# Patient Record
Sex: Male | Born: 1985 | Race: Black or African American | Hispanic: No | Marital: Single | Smoking: Light tobacco smoker
Health system: Southern US, Community
[De-identification: ages and names within clinical notes are randomized; demographics above are authoritative.]

## PROBLEM LIST (undated history)

## (undated) DIAGNOSIS — F319 Bipolar disorder, unspecified: Secondary | ICD-10-CM

## (undated) DIAGNOSIS — F259 Schizoaffective disorder, unspecified: Secondary | ICD-10-CM

---

## 2015-11-08 ENCOUNTER — Ambulatory Visit: Payer: Self-pay

## 2018-10-16 ENCOUNTER — Other Ambulatory Visit: Payer: Self-pay

## 2018-10-16 ENCOUNTER — Emergency Department
Admission: EM | Admit: 2018-10-16 | Discharge: 2018-10-17 | Disposition: A | Discharge status: Other Acute Inpatient Hospital | Payer: Medicaid Other | Attending: Emergency Medicine | Admitting: Emergency Medicine

## 2018-10-16 ENCOUNTER — Encounter: Payer: Self-pay | Admitting: Emergency Medicine

## 2018-10-16 DIAGNOSIS — F319 Bipolar disorder, unspecified: Secondary | ICD-10-CM | POA: Diagnosis not present

## 2018-10-16 DIAGNOSIS — Z046 Encounter for general psychiatric examination, requested by authority: Secondary | ICD-10-CM | POA: Diagnosis present

## 2018-10-16 DIAGNOSIS — F259 Schizoaffective disorder, unspecified: Secondary | ICD-10-CM | POA: Diagnosis not present

## 2018-10-16 DIAGNOSIS — R4689 Other symptoms and signs involving appearance and behavior: Secondary | ICD-10-CM | POA: Insufficient documentation

## 2018-10-16 DIAGNOSIS — R443 Hallucinations, unspecified: Secondary | ICD-10-CM | POA: Insufficient documentation

## 2018-10-16 DIAGNOSIS — F29 Unspecified psychosis not due to a substance or known physiological condition: Secondary | ICD-10-CM | POA: Insufficient documentation

## 2018-10-16 HISTORY — DX: Schizoaffective disorder, unspecified: F25.9

## 2018-10-16 HISTORY — DX: Bipolar disorder, unspecified: F31.9

## 2018-10-16 LAB — COMPREHENSIVE METABOLIC PANEL
ALT: 19 U/L (ref 0–44)
AST: 60 U/L — AB (ref 15–41)
Albumin: 4.5 g/dL (ref 3.5–5.0)
Alkaline Phosphatase: 46 U/L (ref 38–126)
Anion gap: 12 (ref 5–15)
BUN: 32 mg/dL — AB (ref 6–20)
CALCIUM: 9.3 mg/dL (ref 8.9–10.3)
CO2: 21 mmol/L — ABNORMAL LOW (ref 22–32)
CREATININE: 1.56 mg/dL — AB (ref 0.61–1.24)
Chloride: 105 mmol/L (ref 98–111)
GFR calc Af Amer: >60 mL/min (ref >60)
GFR calc non Af Amer: 57 mL/min — ABNORMAL LOW (ref >60)
Glucose, Bld: 74 mg/dL (ref 70–99)
Potassium: 3.3 mmol/L — ABNORMAL LOW (ref 3.5–5.1)
Sodium: 138 mmol/L (ref 135–145)
Total Bilirubin: 1.9 mg/dL — ABNORMAL HIGH (ref 0.3–1.2)
Total Protein: 7.2 g/dL (ref 6.5–8.1)

## 2018-10-16 LAB — CBC
HCT: 40.5 % (ref 39.0–52.0)
Hemoglobin: 13.7 g/dL (ref 13.0–17.0)
MCH: 32 pg (ref 26.0–34.0)
MCHC: 33.8 g/dL (ref 30.0–36.0)
MCV: 94.6 fL (ref 80.0–100.0)
Platelets: 209 10*3/uL (ref 150–400)
RBC: 4.28 MIL/uL (ref 4.22–5.81)
RDW: 12.4 % (ref 11.5–15.5)
WBC: 5.4 10*3/uL (ref 4.0–10.5)
nRBC: 0 % (ref 0.0–0.2)

## 2018-10-16 LAB — SALICYLATE LEVEL: Salicylate Lvl: <7 mg/dL (ref 2.8–30.0)

## 2018-10-16 LAB — ACETAMINOPHEN LEVEL: Acetaminophen (Tylenol), Serum: <10 ug/mL — ABNORMAL LOW (ref 10–30)

## 2018-10-16 LAB — ETHANOL: Alcohol, Ethyl (B): <10 mg/dL (ref <10)

## 2018-10-16 MED ORDER — LORAZEPAM 2 MG/ML IJ SOLN
2.0000 mg | Freq: Once | INTRAMUSCULAR | Status: AC
  Administered 2018-10-16: 2 mg via INTRAMUSCULAR
  Filled 2018-10-16: qty 1

## 2018-10-16 MED ORDER — HALOPERIDOL LACTATE 5 MG/ML IJ SOLN
5.0000 mg | Freq: Once | INTRAMUSCULAR | Status: AC
  Administered 2018-10-16: 5 mg via INTRAMUSCULAR
  Filled 2018-10-16: qty 1

## 2018-10-16 NOTE — BH Assessment (Signed)
Assessment Note  Jackson [name] is an 33 y.o. male who presents today complaining of anger being handcuffed.  Patient was apparently handcuffed because he was hallucinating having hyper religious ideas and aggressive behavior.  Patient was very agitated, initially we could not get much of a history we did give him Haldol and Ativan, to keep him safe.  Patient has a history of schizophrenia.  There is reports of hallucinations although he denies it. Pt to room 22 in handcuffs with officers. IM medications administered as ordered. Pt has hx/o Bipolar and schizophrenia. Pt has not been taking his medications for the past 3 weeks. Pt has been threatening staff from group home and told them that Jesus was talking to him. Pt is uncooperative in triage. Refusing to dress out.  Diagnosis: Schizoaffective disorder  Past Medical History:  Past Medical History:  Diagnosis Date  . Bipolar 1 disorder (HCC)   . Schizoaffective disorder (HCC)     History reviewed. No pertinent surgical history.  Family History: No family history on file.  Social History:  has no history on file for tobacco, alcohol, and drug.  Additional Social History:     CIWA:   COWS:    Allergies: No Known Allergies  Home Medications: (Not in a hospital admission)   OB/GYN Status:  No LMP for male patient.  General Assessment Data Assessment unable to be completed: Yes Reason for not completing assessment: . IM medications administered as ordered. Pt has hx/o Bipolar and schizophrenia.  Admission Status: Involuntary                    Mental Status Report Motor Activity: Freedom of movement                            Advance Directives (For Healthcare) Does Patient Have a Medical Advance Directive?: (UTA)          Disposition:     On Site Evaluation by:   Reviewed with Physician:    Jackson [name] 10/16/2018 7:55 PM

## 2018-10-16 NOTE — ED Notes (Signed)
Pt to room 22 in handcuffs with officers.  IM medications administered as ordered. Pt to be dressed out after mediations have taken effect. Maintained on 15 minute checks and observation by security camera for safety.

## 2018-10-16 NOTE — ED Notes (Signed)
Pt. Brought in by [employer] in forensic restraints.  Pt. Was given medication, pt. Resting on bed.  Forensic restraints removed, no observed injury.  Pt. Woke up while this nurse was changing him.  Patients pants and underwear changed into scrubs.  Blood obtained.  Pt. Still has 2 t-shirts on.  Pt. Sleeping at this time.

## 2018-10-16 NOTE — ED Triage Notes (Signed)
Pt to ED via [employer] under IVC. Pt has hx/o Bipolar and schizophrenia. Pt has not been taking his medications for the past 3 weeks. Pt has been threatening staff from group home and told them that Jesus was talking to him. Pt is uncooperative in triage. Refusing to dress out at this time.

## 2018-10-16 NOTE — ED Provider Notes (Addendum)
[facility] Emergency Department Provider Note  ____________________________________________   I have reviewed the triage vital signs and the nursing notes. Where available I have reviewed prior notes and, if possible and indicated, outside hospital notes.    HISTORY  Chief Complaint Hallucinations    HPI Jackson [name] is a 33 y.o. male who presents today complaining of anger being handcuffed.  Patient was apparently handcuffed because he was hallucinating having hyper religious ideas and aggressive behavior.  Patient was very agitated, initially we could not get much of a history we did give him Haldol and Ativan, to keep him safe.  Patient has a history of schizophrenia.  There is reports of hallucinations although he denies it to me at this time he is not really talking to me very much     Past Medical History:  Diagnosis Date  . Bipolar 1 disorder (HCC)   . Schizoaffective disorder (HCC)     There are no active problems to display for this patient.   History reviewed. No pertinent surgical history.  Prior to Admission medications   Not on File    Allergies Patient has no known allergies.  No family history on file.  Social History Social History   Tobacco Use  . Smoking status: Not on file  Substance Use Topics  . Alcohol use: Not on file  . Drug use: Not on file    Review of Systems patient answers no to all my questions. Constitutional: No fever/chills Eyes: No visual changes. ENT: No sore throat. No stiff neck no neck pain Cardiovascular: Denies chest pain. Respiratory: Denies shortness of breath. Gastrointestinal:   no vomiting.  No diarrhea.  No constipation. Genitourinary: Negative for dysuria. Musculoskeletal: Negative lower extremity swelling Skin: Negative for rash. Neurological: Negative for severe headaches, focal weakness or numbness.   ____________________________________________   PHYSICAL EXAM:  VITAL  SIGNS: ED Triage Vitals  Enc Vitals Group     BP      Pulse      Resp      Temp      Temp src      SpO2      Weight      Height      Head Circumference      Peak Flow      Pain Score      Pain Loc      Pain Edu?      Excl. in GC?     Constitutional: Seems upset but will converse with me a little bit after medications.  In no acute distress. Eyes: Conjunctivae are normal Head: Atraumatic HEENT: No congestion/rhinnorhea. Mucous membranes are moist.  Oropharynx non-erythematous Neck:   Nontender with no meningismus, no masses, no stridor Cardiovascular: Normal rate, regular rhythm. Grossly normal heart sounds.  Good peripheral circulation. Respiratory: Normal respiratory effort.  No retractions. Lungs CTAB. Abdominal: Soft and nontender. No distention. No guarding no rebound Back:  There is no focal tenderness or step off.  there is no midline tenderness there are no lesions noted. there is no CVA tenderness Musculoskeletal: No lower extremity tenderness, no upper extremity tenderness. No joint effusions, no DVT signs strong distal pulses no edema Neurologic:  Normal speech and language. No gross focal neurologic deficits are appreciated.  Skin:  Skin is warm, dry and intact. No rash noted. Psychiatric: Mood and affect agitated and upset  ____________________________________________   LABS (all labs ordered are listed, but only abnormal results are displayed)  Labs Reviewed  COMPREHENSIVE METABOLIC PANEL  ETHANOL  SALICYLATE LEVEL  ACETAMINOPHEN LEVEL  CBC  URINE DRUG SCREEN, QUALITATIVE ([facility] ONLY)    Pertinent labs  results that were available during my care of the patient were reviewed by me and considered in my medical decision making (see chart for details). ____________________________________________  EKG  I personally interpreted any EKGs ordered by me or triage  ____________________________________________  RADIOLOGY  Pertinent labs & imaging results  that were available during my care of the patient were reviewed by me and considered in my medical decision making (see chart for details). If possible, patient and/or family made aware of any abnormal findings.  No results found. ____________________________________________    PROCEDURES  Procedure(s) performed: None  Procedures  Critical Care performed: None  ____________________________________________   INITIAL IMPRESSION / ASSESSMENT AND PLAN / ED COURSE  Pertinent labs & imaging results that were available during my care of the patient were reviewed by me and considered in my medical decision making (see chart for details).  Patient with a history of schizophrenia presents today after noncompliance with medications with agitation and reported hallucinations.  He is under IVC.  We have given him medication to calm him down so he does not hurt himself or others, and we will institute a medical screening exam prior to psychiatric disposition.  ----------------------------------------- 8:09 PM on 10/16/2018 -----------------------------------------  Sleeping comfortably.  ----------------------------------------- 11:29 PM on 10/16/2018 -----------------------------------------  Singed out to dr. Fanny Bien at the end of my shift.    ____________________________________________   FINAL CLINICAL IMPRESSION(S) / ED DIAGNOSES  Final diagnoses:  None      This chart was dictated using voice recognition software.  Despite best efforts to proofread,  errors can occur which can change meaning.   3    Jeanmarie Plant, MD 10/16/18 Serena Croissant    Jeanmarie Plant, MD 10/16/18 2009    Jeanmarie Plant, MD 10/16/18 [id number]

## 2018-10-17 ENCOUNTER — Encounter: Payer: Self-pay | Admitting: Family Medicine

## 2018-10-17 ENCOUNTER — Inpatient Hospital Stay
Admission: AD | Admit: 2018-10-17 | Discharge: 2019-01-07 | DRG: 885 | Disposition: A | Discharge status: Home / Self Care | Payer: Medicaid Other | Attending: Psychiatry | Admitting: Psychiatry

## 2018-10-17 DIAGNOSIS — F17210 Nicotine dependence, cigarettes, uncomplicated: Secondary | ICD-10-CM | POA: Diagnosis present

## 2018-10-17 DIAGNOSIS — Z91199 Patient's noncompliance with other medical treatment and regimen due to unspecified reason: Secondary | ICD-10-CM

## 2018-10-17 DIAGNOSIS — F419 Anxiety disorder, unspecified: Secondary | ICD-10-CM | POA: Diagnosis present

## 2018-10-17 DIAGNOSIS — F250 Schizoaffective disorder, bipolar type: Secondary | ICD-10-CM | POA: Diagnosis not present

## 2018-10-17 DIAGNOSIS — Z23 Encounter for immunization: Secondary | ICD-10-CM | POA: Diagnosis not present

## 2018-10-17 DIAGNOSIS — Z9119 Patient's noncompliance with other medical treatment and regimen: Secondary | ICD-10-CM

## 2018-10-17 DIAGNOSIS — G4700 Insomnia, unspecified: Secondary | ICD-10-CM | POA: Diagnosis present

## 2018-10-17 DIAGNOSIS — Z1159 Encounter for screening for other viral diseases: Secondary | ICD-10-CM | POA: Diagnosis not present

## 2018-10-17 DIAGNOSIS — Z716 Tobacco abuse counseling: Secondary | ICD-10-CM | POA: Diagnosis not present

## 2018-10-17 DIAGNOSIS — F259 Schizoaffective disorder, unspecified: Secondary | ICD-10-CM | POA: Diagnosis present

## 2018-10-17 MED ORDER — PALIPERIDONE ER 3 MG PO TB24: 6.0000 mg | ORAL_TABLET | Freq: Every day | ORAL | Status: DC

## 2018-10-17 MED ORDER — LORAZEPAM 2 MG/ML IJ SOLN
2.0000 mg | INTRAMUSCULAR | Status: DC | PRN
  Administered 2018-10-20: 2 mg via INTRAMUSCULAR
  Filled 2018-10-17: qty 1

## 2018-10-17 MED ORDER — LORAZEPAM 2 MG/ML IJ SOLN: 2.0000 mg | INTRAMUSCULAR | Status: DC | PRN

## 2018-10-17 MED ORDER — HYDROXYZINE HCL 50 MG PO TABS
50.0000 mg | ORAL_TABLET | ORAL | Status: DC | PRN
  Administered 2018-10-26 – 2018-10-28 (×3): 50 mg via ORAL
  Filled 2018-10-17 (×4): qty 1

## 2018-10-17 MED ORDER — ALUM & MAG HYDROXIDE-SIMETH 200-200-20 MG/5ML PO SUSP
30.0000 mL | ORAL | Status: DC | PRN
  Administered 2018-10-24 – 2018-10-26 (×3): 30 mL via ORAL
  Filled 2018-10-17 (×3): qty 30

## 2018-10-17 MED ORDER — DIPHENHYDRAMINE HCL 50 MG/ML IJ SOLN: 50.0000 mg | INTRAMUSCULAR | Status: DC | PRN

## 2018-10-17 MED ORDER — DIPHENHYDRAMINE HCL 50 MG/ML IJ SOLN
50.0000 mg | INTRAMUSCULAR | Status: DC | PRN
  Administered 2018-10-20: 50 mg via INTRAMUSCULAR
  Filled 2018-10-17: qty 1

## 2018-10-17 MED ORDER — HALOPERIDOL LACTATE 5 MG/ML IJ SOLN: 5.0000 mg | INTRAMUSCULAR | Status: DC | PRN

## 2018-10-17 MED ORDER — DIVALPROEX SODIUM ER 500 MG PO TB24
1500.0000 mg | ORAL_TABLET | Freq: Every day | ORAL | Status: DC
  Filled 2018-10-17: qty 3

## 2018-10-17 MED ORDER — TRAZODONE HCL 100 MG PO TABS
100.0000 mg | ORAL_TABLET | Freq: Every evening | ORAL | Status: DC | PRN
  Administered 2018-10-26 – 2019-01-02 (×4): 100 mg via ORAL
  Filled 2018-10-17 (×4): qty 1

## 2018-10-17 MED ORDER — DIVALPROEX SODIUM ER 500 MG PO TB24: 1500.0000 mg | ORAL_TABLET | Freq: Every day | ORAL | Status: DC

## 2018-10-17 MED ORDER — ACETAMINOPHEN 325 MG PO TABS
650.0000 mg | ORAL_TABLET | Freq: Four times a day (QID) | ORAL | Status: DC | PRN
  Administered 2018-10-26: 650 mg via ORAL
  Filled 2018-10-17: qty 2

## 2018-10-17 MED ORDER — PALIPERIDONE ER 3 MG PO TB24
6.0000 mg | ORAL_TABLET | Freq: Every day | ORAL | Status: DC
  Filled 2018-10-17: qty 2

## 2018-10-17 MED ORDER — MAGNESIUM HYDROXIDE 400 MG/5ML PO SUSP: 30.0000 mL | Freq: Every day | ORAL | Status: DC | PRN

## 2018-10-17 NOTE — Progress Notes (Addendum)
Patient stated that he had a cell phone, wallet, and keys that weren't with his belongings. This Clinical research associate called upstairs to the [employer] and Baird Lyons informed me that she was not able to find anything for him. Patient also stated to staff that he would not sign his belongings sheet until these items were found.
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
## 2018-10-17 NOTE — Progress Notes (Signed)
Pt ambulatory to [facility] from ED [facility]. Presents irritable with blunted affect on initial contact. A & O to self, place and situation. Observed to be hyper-religious with avertive eye contact and minimal verbal interaction. Denies SI, HI, AVH and pain "just my feelings hurt; I can do praise and worship in the yard, it shouldn't be a problem". States he's been off his medications for "about 2 months now". Support and encouragement offered to pt as needed. Q 15 minutes safety checks maintained without self harm gestures or outburst to note thus far.

## 2018-10-17 NOTE — Progress Notes (Signed)
D- Patient agitated and restless. Refuses medicines. Affect/mood: continues in confrontative mood and makes no eye contact.  Denies SI, HI, AVH, and pain. Quotes. Why should I take pills just because they say so? Goal: patient has refused to discuss goals, reports that it is unnecessary. Refused all offers for medicines. . Support and encouragement provided.  Routine safety checks conducted every 15 minutes.  Patient informed to notify staff with problems or concerns. R- Patient contracts for safety at this time. Patient . Patient unreceptive to discussion, calm, and not  cooperative. Patient interacts well with others on the unit.  Patient remains safe at this time.

## 2018-10-17 NOTE — ED Notes (Signed)
Patient with discharge from Ed to BMU orders, all belongings sent with patient and nurse called report to floor, reported to [facility] RN. Patient transferred via w/c to BMU.

## 2018-10-17 NOTE — Tx Team (Signed)
Initial Treatment Plan 10/17/2018 6:15 PM Jackson [name] IWO:[id number]    PATIENT STRESSORS: Health problems Medication change or noncompliance   PATIENT STRENGTHS: Communication skills Religious Affiliation Supportive family/friends   PATIENT IDENTIFIED PROBLEMS: Psychosis 10/17/2018  Non compliant with Medication 10/17/2018                   DISCHARGE CRITERIA:  Ability to meet basic life and health needs Medical problems require only outpatient monitoring  PRELIMINARY DISCHARGE PLAN: Outpatient therapy Return to previous living arrangement  PATIENT/FAMILY INVOLVEMENT: This treatment plan has been presented to and reviewed with the patient, Jackson [name], and/or family member, .  The patient and family have been given the opportunity to ask questions and make suggestions.  Crist Infante, RN 10/17/2018, 6:15 PM

## 2018-10-17 NOTE — Plan of Care (Signed)
  Problem: Education: Goal: Knowledge of [place] Education information/materials will improve Note:  Staff educated patient on unit programing and [place],  patient unable to verbalize understanding  staff continue to redirect
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
## 2018-10-17 NOTE — Progress Notes (Signed)
Admission Note:   Report was received from [place], [place] at [id number] on a 33 year old male who presents IVC in no acute distress for bizarre behavior and hyper-religiousity. Patient appears flat and irritable. Patient stated that he is "frustrated". Patient was very minimal with this Clinical research associate. Patient denied SI/HI/AVH as well as any signs/symptoms of depression and anxiety. Patient has a past medical history of Bipolar and Schizophrenia. Skin was assessed with Dedra Skeens, RN and found to be clear of any abnormal marks apart from a birthmark to his upper left shoulder and a scratch on his right cheek. Patient searched and no contraband found and unit policies explained and understanding verbalized. Consents obtained. Food and fluids offered, and fluids accepted. Patient had no additional questions or concerns.

## 2018-10-17 NOTE — Plan of Care (Signed)
Patient continues to deny any issues affecting his mental health. When asked what he thought the purpose of his admission was "I think they just want me to hang out.."   I am not bothering anybody, I don't take medicines out in the world nor in here. I have no need to take medicines. He refuses any discussion regarding his mental health. Refused his prescribed medication repeatedly. Will continue to monitor and contract for safety.

## 2018-10-17 NOTE — ED Provider Notes (Signed)
-----------------------------------------   3:23 AM on 10/17/2018 -----------------------------------------   Blood pressure (!) 138/92, pulse 86, temperature 98.1 F (36.7 C), temperature source Oral, resp. rate 18, SpO2 98 %.  The patient is calm and resting.  There have been no acute events since the last update.  Awaiting disposition plan from Behavioral Medicine team.    Sharyn Creamer, MD 10/17/18 [phone]

## 2018-10-17 NOTE — ED Notes (Signed)
RN Susy Frizzle is trying to assist with pt changing out.

## 2018-10-17 NOTE — Progress Notes (Signed)
Writer spoke with pt's mom who's also his legal guardian. Contact information listed below mom's (cell) [phone] and father (cell) [phone]. Per pt's mother, pt was at [facility] for 2 years, then d/c and sent to the group home where he came from. She stated that pt stopped taking his medications on 09/17/2018 at the group home. Pt's mother is willing to send his medical records to [facility] from [place]. Fax number was given to her by Clinical research associate.

## 2018-10-17 NOTE — BH Assessment (Signed)
Patient is to be admitted to [facility] by Dr. Zonia Kief.  Attending Physician will be Dr. Toni Amend.   Patient has been assigned to room 312, by [facility] Charge Nurse Gwen.   ER staff is aware of the admission:  [facility] ER Secretary    Dr. Mayford Knife, ER MD   Toniann Fail Patient's Nurse   [place] Patient Access.

## 2018-10-17 NOTE — Progress Notes (Signed)
Patient refused urinalysis.

## 2018-10-18 DIAGNOSIS — F250 Schizoaffective disorder, bipolar type: Principal | ICD-10-CM

## 2018-10-18 DIAGNOSIS — Z91199 Patient's noncompliance with other medical treatment and regimen due to unspecified reason: Secondary | ICD-10-CM

## 2018-10-18 DIAGNOSIS — Z9119 Patient's noncompliance with other medical treatment and regimen: Secondary | ICD-10-CM

## 2018-10-18 MED ORDER — OLANZAPINE 5 MG PO TBDP
20.0000 mg | ORAL_TABLET | Freq: Every day | ORAL | Status: DC
  Filled 2018-10-18: qty 4

## 2018-10-18 MED ORDER — DIVALPROEX SODIUM 500 MG PO DR TAB
500.0000 mg | DELAYED_RELEASE_TABLET | Freq: Two times a day (BID) | ORAL | Status: DC
  Administered 2018-10-21 – 2018-10-24 (×7): 500 mg via ORAL
  Filled 2018-10-18 (×10): qty 1

## 2018-10-18 NOTE — Tx Team (Addendum)
Interdisciplinary Treatment and Diagnostic Plan Update  10/18/2018 Time of Session: 2:30PM Byntlee Krogh MRN: [id number]  Principal Diagnosis: <principal problem not specified>  Secondary Diagnoses: Active Problems:   Schizoaffective disorder, bipolar type (HCC)   Current Medications:  Current Facility-Administered Medications  Medication Dose Route Frequency Provider Last Rate Last Dose  . acetaminophen (TYLENOL) tablet 650 mg  650 mg Oral Q6H PRN [name], MD      . alum & mag hydroxide-simeth (MAALOX/MYLANTA) 200-200-20 MG/5ML suspension 30 mL  30 mL Oral Q4H PRN Katheran Awe C, MD      . haloperidol lactate (HALDOL) injection 5 mg  5 mg Intramuscular Q4H PRN [name], MD       And  . diphenhydrAMINE (BENADRYL) injection 50 mg  50 mg Intramuscular Q4H PRN [name], MD       And  . LORazepam (ATIVAN) injection 2 mg  2 mg Intramuscular Q4H PRN [name], MD      . divalproex (DEPAKOTE ER) 24 hr tablet 1,500 mg  1,500 mg Oral QHS Katheran Awe C, MD      . hydrOXYzine (ATARAX/VISTARIL) tablet 50 mg  50 mg Oral Q4H PRN Katheran Awe C, MD      . magnesium hydroxide (MILK OF MAGNESIA) suspension 30 mL  30 mL Oral Daily PRN [name], MD      . paliperidone (INVEGA) 24 hr tablet 6 mg  6 mg Oral Daily Katheran Awe C, MD      . traZODone (DESYREL) tablet 100 mg  100 mg Oral QHS PRN [name], MD       PTA Medications: No medications prior to admission.    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   Physician Treatment Plan for Primary Diagnosis: <principal problem not specified> Long Term Goal(s):     Short Term Goals:     Medication Management: Evaluate patient's response, side effects, and tolerance of medication regimen.  Therapeutic Interventions: 1 to 1 [place], Unit Group [place] and Medication administration.  Evaluation of Outcomes: Progressing  Physician Treatment Plan for Secondary Diagnosis: Active Problems:   Schizoaffective disorder, bipolar type (HCC)  Long Term Goal(s):     Short Term Goals:       Medication Management: Evaluate patient's response, side effects, and tolerance of medication regimen.  Therapeutic Interventions: 1 to 1 [place], Unit Group [place] and Medication administration.  Evaluation of Outcomes: Progressing   RN Treatment Plan for Primary Diagnosis: <principal problem not specified> Long Term Goal(s): Knowledge of disease and therapeutic regimen to maintain health will improve  Short Term Goals: Ability to verbalize frustration and anger appropriately will improve, Ability to demonstrate self-control, Ability to participate in decision making will improve and Ability to verbalize feelings will improve  Medication Management: RN will administer medications as ordered by provider, will assess and evaluate patient's response and provide education to patient for prescribed medication. RN will report any adverse and/or side effects to prescribing provider.  Therapeutic Interventions: 1 on 1 counseling [place], Psychoeducation, Medication administration, Evaluate responses to treatment, Monitor vital signs and CBGs as ordered, Perform/monitor CIWA, COWS, AIMS and Fall Risk screenings as ordered, Perform wound care treatments as ordered.  Evaluation of Outcomes: Progressing   LCSW Treatment Plan for Primary Diagnosis: <principal problem not specified> Long Term Goal(s):  Safe transition to appropriate next level of care at discharge, Engage patient in therapeutic group addressing interpersonal concerns.  Short Term Goals: Engage patient in aftercare planning with  referrals and resources, Increase social support, Increase ability to appropriately verbalize feelings, Increase emotional regulation and Facilitate acceptance of mental health diagnosis and concerns  Therapeutic Interventions: Assess for all discharge needs, 1 to 1 time with Social worker, Explore available resources and support systems, Assess for adequacy in community support network, Educate family and significant other(s) on suicide prevention, Complete Psychosocial Assessment, Interpersonal group therapy.  Evaluation of Outcomes: Progressing   Progress in Treatment: Attending groups: Yes. Participating in groups: Yes. Taking medication as prescribed: No. Toleration medication: No. Family/Significant other contact made: Yes, individual(s) contacted:  CSW completed SPE with the patients mother.  Patient understands diagnosis: Yes. Discussing patient identified problems/goals with staff: Yes. Medical problems stabilized or resolved: Yes. Denies suicidal/homicidal ideation: Yes. Issues/concerns per patient self-inventory: No. Other: none  New problem(s) identified: No, Describe:  none  New Short Term/Long Term Goal(s): elimination of symptoms of psychosis, medication management for mood stabilization; elimination of SI thoughts; development of comprehensive mental wellness plan.  Patient Goals:  "independence"  Discharge Plan or Barriers: Pt has a legal guardian, his mother. Mother reports concenrs that patient can not return to his group home at this time.   Reason for Continuation of Hospitalization: Aggression Delusions  Hallucinations Mania Medication stabilization  Estimated Length of Stay: 1-5 days  Recreational Therapy: Patient Stressors: N/A Patient Goal: Patient will focus on task/topic with 2 prompts from staff within 5 recreation therapy group [place]  Attendees: Patient: Jackson [name] 10/18/2018 4:04 PM  Physician: Dr. Mariea Clonts, MD 10/18/2018 4:04 PM   Nursing:  10/18/2018 4:04 PM  RN Care Manager: 10/18/2018 4:04 PM  Social Worker: Penni Homans, MSW, LCSW 10/18/2018 4:04 PM  Recreational Therapist: Hilbert Bible, LRT 10/18/2018 4:04 PM  Other: Iris Pert, LCSW 10/18/2018 4:04 PM  Other: Lowella Dandy, LCSW 10/18/2018 4:04 PM  Other: 10/18/2018 4:04 PM    Scribe for Treatment Team: Harden Mo, LCSW 10/18/2018 4:04 PM

## 2018-10-18 NOTE — Plan of Care (Signed)
Guest continues with isolation and suspicion of any questions or approach. He continues to protest against any possibility of considering taking medications prescribed for his health improvement.  He reports "me and the doc talked and I explained that I don't need these medicines! So everything is cool now!" Vital signs are stable but mental health behavior continues with baseline. Dr. Toni Amend  aware of his adamant refusal. Will continue to monitor and adjust treatment as ordered/indicated.

## 2018-10-18 NOTE — BHH Suicide Risk Assessment (Signed)
[facility] Admission Suicide Risk Assessment   Nursing information obtained from:  Patient Demographic factors:  Male Current Mental Status:  NA Loss Factors:  NA Historical Factors:  NA Risk Reduction Factors:  Religious beliefs about death  Total Time spent with patient: 1 hour Principal Problem: Schizoaffective disorder, bipolar type (HCC) Diagnosis:  Principal Problem:   Schizoaffective disorder, bipolar type (HCC) Active Problems:   Noncompliance  Subjective Data: Patient seen, chart reviewed.  Also got to review some notes from [facility].  Denies suicidal or homicidal ideation.  He is manic agitated euphoric.  Poor insight.  Continued Clinical Symptoms:  Alcohol Use Disorder Identification Test Final Score (AUDIT): 0 The "Alcohol Use Disorders Identification Test", Guidelines for Use in Primary Care, Second Edition.  World Science writer [facility]). Score between 0-7:  no or low risk or alcohol related problems. Score between 8-15:  moderate risk of alcohol related problems. Score between 16-19:  high risk of alcohol related problems. Score 20 or above:  warrants further diagnostic evaluation for alcohol dependence and treatment.   CLINICAL FACTORS:   Bipolar Disorder:   Mixed State   Musculoskeletal: Strength & Muscle Tone: within normal limits Gait & Station: normal Patient leans: N/A  Psychiatric Specialty Exam: Physical Exam  Nursing note and vitals reviewed. Constitutional: He appears well-developed and well-nourished.  HENT:  Head: Normocephalic and atraumatic.  Eyes: Pupils are equal, round, and reactive to light. Conjunctivae are normal.  Neck: Normal range of motion.  Cardiovascular: Regular rhythm and normal heart sounds.  Respiratory: Effort normal.  GI: Soft.  Musculoskeletal: Normal range of motion.  Neurological: He is alert.  Skin: Skin is warm and dry.  Psychiatric: His affect is labile. His speech is rapid and/or pressured and tangential. He  is agitated. He is not aggressive. Thought content is paranoid and delusional. Cognition and memory are impaired. He expresses impulsivity.    Review of Systems  Constitutional: Negative.   HENT: Negative.   Eyes: Negative.   Respiratory: Negative.   Cardiovascular: Negative.   Gastrointestinal: Negative.   Musculoskeletal: Negative.   Skin: Negative.   Neurological: Negative.   Psychiatric/Behavioral: Negative for depression, hallucinations, memory loss, substance abuse and suicidal ideas. The patient is not nervous/anxious and does not have insomnia.     Blood pressure 117/71, pulse (!) 57, temperature (!) 97.5 F (36.4 C), temperature source Oral, resp. rate 16, height 5\' 11"  (1.803 m), weight 88.5 kg, SpO2 100 %.Body mass index is 27.2 kg/m.  General Appearance: Fairly Groomed  Eye Contact:  Fair  Speech:  Clear and Coherent and Pressured  Volume:  Increased  Mood:  Euphoric and Irritable  Affect:  Labile  Thought Process:  Disorganized  Orientation:  Full (Time, Place, and Person)  Thought Content:  Illogical, Delusions, Rumination and Tangential  Suicidal Thoughts:  No  Homicidal Thoughts:  No  Memory:  Immediate;   Fair Recent;   Fair Remote;   Fair  Judgement:  Impaired  Insight:  Shallow  Psychomotor Activity:  Increased  Concentration:  Concentration: Poor  Recall:  [employer] of Knowledge:  Fair  Language:  Fair  Akathisia:  No  Handed:  Right  AIMS (if indicated):     Assets:  Physical Health Resilience Social Support  ADL's:  Impaired  Cognition:  Impaired,  Mild  Sleep:  Number of Hours: 3      COGNITIVE FEATURES THAT CONTRIBUTE TO RISK:  Closed-mindedness    SUICIDE RISK:   Minimal: No identifiable suicidal  ideation.  Patients presenting with no risk factors but with morbid ruminations; may be classified as minimal risk based on the severity of the depressive symptoms  PLAN OF CARE: Patient is not presenting with depression but in fact is having  a euphoric mania.  Totally denies suicidal ideation.  Denies past suicide attempts.  Does have bipolar disorder which innately raises some risk but at this point does not seem to be at significant risk of suicide in the near term.  I certify that inpatient services furnished can reasonably be expected to improve the patient's condition.   Mordecai Rasmussen, MD 10/18/2018, 5:37 PM

## 2018-10-18 NOTE — BHH Group Notes (Signed)
LCSW Group Therapy Note   10/18/2018 12:38 PM   Type of Therapy and Topic:  Group Therapy:  Overcoming Obstacles   Participation Level:  None   Description of Group:    In this group patients will be encouraged to explore what they see as obstacles to their own wellness and recovery. They will be guided to discuss their thoughts, feelings, and behaviors related to these obstacles. The group will process together ways to cope with barriers, with attention given to specific choices patients can make. Each patient will be challenged to identify changes they are motivated to make in order to overcome their obstacles. This group will be process-oriented, with patients participating in exploration of their own experiences as well as giving and receiving support and challenge from other group members.   Therapeutic Goals: 1. Patient will identify personal and current obstacles as they relate to admission. 2. Patient will identify barriers that currently interfere with their wellness or overcoming obstacles.  3. Patient will identify feelings, thought process and behaviors related to these barriers. 4. Patient will identify two changes they are willing to make to overcome these obstacles:      Summary of Patient Progress Pt was present in group and reported his name when asked. Pt then appeared to be sleeping the entire group session.     Therapeutic Modalities:   Cognitive Behavioral Therapy Solution Focused Therapy Motivational Interviewing Relapse Prevention Therapy  Iris Pert, MSW, LCSW Clinical Social Work 10/18/2018 12:38 PM

## 2018-10-18 NOTE — BHH Counselor (Signed)
CSW spoke with mother Liana Crocker, [phone], mother, who reports that the patient can't return to group home at this time to her knowledge and can't return until his medication regulated.  Mother reports that the patient "can be extremely aggressive but he doesn't hit, he makes threatening comments".  Mother reports that the patient was at [facility] with Victory Dakin.  Penni Homans, MSW, LCSW 10/18/2018 10:14 AM

## 2018-10-18 NOTE — H&P (Signed)
Psychiatric Admission Assessment Adult  Patient Identification: Jackson [name] MRN:  [id number] Date of Evaluation:  10/18/2018 Chief Complaint:  Schizoeffective disorder Principal Diagnosis: Schizoaffective disorder, bipolar type (HCC) Diagnosis:  Principal Problem:   Schizoaffective disorder, bipolar type (HCC) Active Problems:   Noncompliance  History of Present Illness: Patient seen chart reviewed.  Patient's mother also called and spoke with social work.  She also provided some information regarding his hospital stay at [facility].  This patient with a history of schizoaffective disorder was brought under IVC from his group home.  It was reported that he was acting bizarrely.  Chasing dogs down the street.  Dancing in the front yard.  Talking crazy.  On interview today the patient denies every specific thing that I mentioned to him except the dancing.  We have some video provided by the mother and he certainly did look very bizarre when he was dancing at his home.  Patient is disorganized in his thinking and has pressured speech.  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  Denies suicidal or homicidal ideation.  Minimizes or denies hallucinations.  Patient has little insight and refuses flat out to take any medication.  Denies alcohol or drug abuse apparently he had been off of his psychiatric medicines for at least several weeks maybe longer than that. Associated Signs/Symptoms: Depression Symptoms:  difficulty concentrating, (Hypo) Manic Symptoms:  Delusions, Distractibility, Elevated Mood, Grandiosity, Hallucinations, Impulsivity, Irritable Mood, Anxiety Symptoms:  None reported Psychotic Symptoms:  Delusions, Ideas of Reference, Paranoia, PTSD Symptoms: Negative Total Time spent with patient: 1 hour  Past Psychiatric History: Patient had a 2-year stay at brought in the hospital for his bipolar disorder.  Eventually  his illness was brought under control with Zyprexa and lithium which were the medicines he was taking at discharge.  Unclear what medicines he was taking most recently the patient claims to not remember.  Every medicine that I mentioned he has complaints about.  He shows me evidence of having had gynecomastia in the past and blames it on medication.  He also says that when he was discharged from [facility] he weighed over 300 pounds.  He says because of this he refuses to take any medicine.  Denies past suicide attempts.  Is the patient at risk to self? Yes.    Has the patient been a risk to self in the past 6 months? No.  Has the patient been a risk to self within the distant past? Yes.    Is the patient a risk to others? Yes.    Has the patient been a risk to others in the past 6 months? No.  Has the patient been a risk to others within the distant past? Yes.     Prior Inpatient Therapy:   Prior Outpatient Therapy:    Alcohol Screening: 1. How often do you have a drink containing alcohol?: Never 2. How many drinks containing alcohol do you have on a typical day when you are drinking?: 1 or 2 3. How often do you have six or more drinks on one occasion?: Never AUDIT-C Score: 0 4. How often during the last year have you found that you were not able to stop drinking once you had started?: Never 5. How often during the last year have you failed to do what was normally expected from you becasue of drinking?: Never 6. How often during the last year have you needed a first  drink in the morning to get yourself going after a heavy drinking session?: Never 7. How often during the last year have you had a feeling of guilt of remorse after drinking?: Never 8. How often during the last year have you been unable to remember what happened the night before because you had been drinking?: Never 9. Have you or someone else been injured as a result of your drinking?: No 10. Has a relative or friend or a doctor or  another health worker been concerned about your drinking or suggested you cut down?: No Alcohol Use Disorder Identification Test Final Score (AUDIT): 0 Alcohol Brief Interventions/Follow-up: AUDIT Score <7 follow-up not indicated Substance Abuse History in the last 12 months:  No. Consequences of Substance Abuse: Negative Previous Psychotropic Medications: Yes  Psychological Evaluations: Yes  Past Medical History:  Past Medical History:  Diagnosis Date  . Bipolar 1 disorder (HCC)   . Schizoaffective disorder (HCC)    History reviewed. No pertinent surgical history. Family History: History reviewed. No pertinent family history. Family Psychiatric  History: Denies any Tobacco Screening: Have you used any form of tobacco in the last 30 days? (Cigarettes, Smokeless Tobacco, Cigars, and/or Pipes): Yes Tobacco use, Select all that apply: 4 or less cigarettes per day Are you interested in Tobacco Cessation Medications?: No, patient refused Counseled patient on smoking cessation including recognizing danger situations, developing coping skills and basic information about quitting provided: Refused/Declined practical counseling Social History:  Social History   Substance and Sexual Activity  Alcohol Use Not on file     Social History   Substance and Sexual Activity  Drug Use Not on file    Additional Social History: Marital status: Single Are you sexually active?: No What is your sexual orientation?: Heterosexual Has your sexual activity been affected by drugs, alcohol, medication, or emotional stress?: Pt denies.  Does patient have children?: No                         Allergies:  No Known Allergies Lab Results:  Results for orders placed or performed during the hospital encounter of 10/16/18 (from the past 48 hour(s))  Comprehensive metabolic panel     Status: Abnormal   Collection Time: 10/16/18  7:20 PM  Result Value Ref Range   Sodium 138 135 - 145 mmol/L    Potassium 3.3 (L) 3.5 - 5.1 mmol/L   Chloride 105 98 - 111 mmol/L   CO2 21 (L) 22 - 32 mmol/L   Glucose, Bld 74 70 - 99 mg/dL   BUN 32 (H) 6 - 20 mg/dL   Creatinine, Ser 1.61 (H) 0.61 - 1.24 mg/dL   Calcium 9.3 8.9 - 09.6 mg/dL   Total Protein 7.2 6.5 - 8.1 g/dL   Albumin 4.5 3.5 - 5.0 g/dL   AST 60 (H) 15 - 41 U/L   ALT 19 0 - 44 U/L   Alkaline Phosphatase 46 38 - 126 U/L   Total Bilirubin 1.9 (H) 0.3 - 1.2 mg/dL   GFR calc non Af Amer 57 (L) >60 mL/min   GFR calc Af Amer >60 >60 mL/min   Anion gap 12 5 - 15    Comment: Performed at [facility], [address] [place], [place] [address]  Ethanol     Status: None   Collection Time: 10/16/18  7:20 PM  Result Value Ref Range   Alcohol, Ethyl (B) <10 <10 mg/dL    Comment: (NOTE) Lowest detectable  limit for serum alcohol is 10 mg/dL. For medical purposes only. Performed at [facility], [address] [place], [place] [address]   Salicylate level     Status: None   Collection Time: 10/16/18  7:20 PM  Result Value Ref Range   Salicylate Lvl <7.0 2.8 - 30.0 mg/dL    Comment: Performed at [facility], [address] [place], [place] [address]  Acetaminophen level     Status: Abnormal   Collection Time: 10/16/18  7:20 PM  Result Value Ref Range   Acetaminophen (Tylenol), Serum <10 (L) 10 - 30 ug/mL    Comment: (NOTE) Therapeutic concentrations vary significantly. A range of 10-30 ug/mL  may be an effective concentration for many patients. However, some  are best treated at concentrations outside of this range. Acetaminophen concentrations >150 ug/mL at 4 hours after ingestion  and >50 ug/mL at 12 hours after ingestion are often associated with  toxic reactions. Performed at [facility], [address] [place], [place] [address]   cbc     Status: None   Collection Time: 10/16/18  7:20 PM  Result Value Ref Range   WBC 5.4 4.0 - 10.5 K/uL   RBC 4.28 4.22 - 5.81 MIL/uL   Hemoglobin  13.7 13.0 - 17.0 g/dL   HCT 03.2 12.2 - 48.2 %   MCV 94.6 80.0 - 100.0 fL   MCH 32.0 26.0 - 34.0 pg   MCHC 33.8 30.0 - 36.0 g/dL   RDW 50.0 37.0 - 48.8 %   Platelets 209 150 - 400 K/uL   nRBC 0.0 0.0 - 0.2 %    Comment: Performed at [facility], [address], [place], [place] [address]    Blood Alcohol level:  Lab Results  Component Value Date   [facility] <10 10/16/2018    Metabolic Disorder Labs:  No results found for: HGBA1C, MPG No results found for: PROLACTIN No results found for: CHOL, TRIG, HDL, CHOLHDL, VLDL, LDLCALC  Current Medications: Current Facility-Administered Medications  Medication Dose Route Frequency Provider Last Rate Last Dose  . acetaminophen (TYLENOL) tablet 650 mg  650 mg Oral Q6H PRN [name], MD      . alum & mag hydroxide-simeth (MAALOX/MYLANTA) 200-200-20 MG/5ML suspension 30 mL  30 mL Oral Q4H PRN Katheran Awe C, MD      . haloperidol lactate (HALDOL) injection 5 mg  5 mg Intramuscular Q4H PRN [name], MD       And  . diphenhydrAMINE (BENADRYL) injection 50 mg  50 mg Intramuscular Q4H PRN [name], MD       And  . LORazepam (ATIVAN) injection 2 mg  2 mg Intramuscular Q4H PRN [name], MD      . divalproex (DEPAKOTE ER) 24 hr tablet 1,500 mg  1,500 mg Oral QHS Katheran Awe C, MD      . divalproex (DEPAKOTE) DR tablet 500 mg  500 mg Oral Q12H Carolin Quang T, MD      . hydrOXYzine (ATARAX/VISTARIL) tablet 50 mg  50 mg Oral Q4H PRN Katheran Awe C, MD      . magnesium hydroxide (MILK OF MAGNESIA) suspension 30 mL  30 mL Oral Daily PRN [name], MD      . OLANZapine zydis (ZYPREXA) disintegrating tablet 20 mg  20 mg Oral QHS Logyn Kendrick T, MD      . traZODone (DESYREL) tablet 100 mg  100 mg Oral QHS PRN Katheran Awe  C, MD       PTA Medications: No medications prior to admission.    Musculoskeletal: Strength & Muscle Tone: within normal limits Gait & Station:  normal Patient leans: N/A  Psychiatric Specialty Exam: Physical Exam  Nursing note and vitals reviewed. Constitutional: He appears well-developed and well-nourished.  HENT:  Head: Normocephalic and atraumatic.  Eyes: Pupils are equal, round, and reactive to light. Conjunctivae are normal.  Neck: Normal range of motion.  Cardiovascular: Regular rhythm and normal heart sounds.  Respiratory: Effort normal. No respiratory distress.  GI: Soft.  Musculoskeletal: Normal range of motion.  Neurological: He is alert.  Skin: Skin is warm and dry.  Psychiatric: His affect is labile. His speech is rapid and/or pressured and tangential. He is agitated. Thought content is paranoid and delusional. Cognition and memory are impaired. He expresses impulsivity.    Review of Systems  Constitutional: Negative.   HENT: Negative.   Eyes: Negative.   Respiratory: Negative.   Cardiovascular: Negative.   Gastrointestinal: Negative.   Musculoskeletal: Negative.   Skin: Negative.   Neurological: Negative.   Psychiatric/Behavioral: Negative.     Blood pressure 117/71, pulse (!) 57, temperature (!) 97.5 F (36.4 C), temperature source Oral, resp. rate 16, height  (1.803 m), weight 88.5 kg, SpO2 100 %.Body mass index is 27.2 kg/m.  General Appearance: Fairly Groomed  Eye Contact:  Fair  Speech:  Pressured  Volume:  Increased  Mood:  Euphoric and Irritable  Affect:  Labile  Thought Process:  Disorganized  Orientation:  Full (Time, Place, and Person)  Thought Content:  Illogical, Delusions, Paranoid Ideation, Rumination and Tangential  Suicidal Thoughts:  No  Homicidal Thoughts:  No  Memory:  Immediate;   Fair Recent;   Fair Remote;   Poor  Judgement:  Poor  Insight:  Shallow  Psychomotor Activity:  Increased  Concentration:  Concentration: Poor  Recall:  Poor  Fund of Knowledge:  Fair  Language:  Fair  Akathisia:  No  Handed:  Right  AIMS (if indicated):     Assets:  Financial  Resources/Insurance Housing Resilience Social Support  ADL's:  Intact  Cognition:  Impaired,  Mild  Sleep:  Number of Hours: 3    Treatment Plan Summary: Daily contact with patient to assess and evaluate symptoms and progress in treatment, Medication management and Plan Reviewed old records in the chart.  Patient has a reasonable point if he really did weigh over 100 pounds more than what he does now about not restarting the Zyprexa.  Also I was concerned that on admission his labs showed some evidence of kidney impairment with an elevated creatinine.  For this reason also I might avoid restarting lithium.  Discussed other medicines potentially to start with patient but he has reasons not to take all of them.  I am going to restart the Zyprexa at 20 mg at night because of its uniquely good response for this kind of condition and his uniquely good prior response individually.  We will start Depakote instead of lithium.  Patient is stating he will refuse medicine and may require forced medicines at some point.  Observation Level/Precautions:  15 minute checks  Laboratory:  Chemistry Profile  Psychotherapy:    Medications:    Consultations:    Discharge Concerns:    Estimated LOS:  Other:     Physician Treatment Plan for Primary Diagnosis: Schizoaffective disorder, bipolar type (HCC) Long Term Goal(s): Improvement in symptoms so as ready for discharge  Short Term  Goals: Ability to demonstrate self-control will improve and Ability to identify and develop effective coping behaviors will improve  Physician Treatment Plan for Secondary Diagnosis: Principal Problem:   Schizoaffective disorder, bipolar type (HCC) Active Problems:   Noncompliance  Long Term Goal(s): Improvement in symptoms so as ready for discharge  Short Term Goals: Compliance with prescribed medications will improve  I certify that inpatient services furnished can reasonably be expected to improve the patient's condition.     Mordecai Rasmussen, MD 3/16/20205:39 PM
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
## 2018-10-18 NOTE — Plan of Care (Signed)
Patient refused to take any medications.States "I don't need any medicines.I am fine."While talking to his mother patient was verbally aggressive and hitting on the phone.When staff asked about that patient was minimizing his action states "I was just taping on the phone."Denies SI,HI and AVH.Appropriate with staff & peers.Appetite and energy level good.Support and encouragement given.

## 2018-10-18 NOTE — [facility] Suicide Risk Assessment (Signed)
B[facility] INPATIENT:  Family/Significant Other Suicide Prevention Education  Suicide Prevention Education:  Education Completed; Jackson [name], [phone], mother has been identified by the patient as the family member/significant other with whom the patient will be residing, and identified as the person(s) who will aid the patient in the event of a mental health crisis (suicidal ideations/suicide attempt).  With written consent from the patient, the family member/significant other has been provided the following suicide prevention education, prior to the and/or following the discharge of the patient.  The suicide prevention education provided includes the following:  Suicide risk factors  Suicide prevention and interventions  National Suicide Hotline telephone number  [facility] assessment telephone number  [facility] Emergency Assistance 911  [facility] and/or Residential Mobile Crisis Unit telephone number  Request made of family/significant other to:  Remove weapons (e.g., guns, rifles, knives), all items previously/currently identified as safety concern.    Remove drugs/medications (over-the-counter, prescriptions, illicit drugs), all items previously/currently identified as a safety concern.  The family member/significant other verbalizes understanding of the suicide prevention education information provided.  The family member/significant other agrees to remove the items of safety concern listed above.  Mother reports that she is faxing guardianship paperwork, medications and information from [place].    Harden Mo 10/18/2018, 10:06 AM

## 2018-10-18 NOTE — Progress Notes (Signed)
Recreation Therapy Notes  Date: 10/18/2018  Time: 9:30 am   Location: Craft room   Behavioral response: N/A   Intervention Topic: Anger Management  Discussion/Intervention: Patient did not attend group.   Clinical Observations/Feedback:  Patient did not attend group.   Darrion Wyszynski LRT/CTRS        Adrian Specht 10/18/2018 10:28 AM 

## 2018-10-18 NOTE — Progress Notes (Signed)
D- Patient alert and oriented. Mood is guarded and withdrawn.  Denies SI, HI, AVH, and pain. Quotes I spoke with the doctor and explained what was happening and we are okay. . Goal: No goals set at present," but I am ready to go home." A- Scheduled medications refused.Support and encouragement provided.  Routine safety checks conducted every 15 minutes.  Patient informed to notify staff with problems or concerns. R- Patient contracts for safety at this time. Patient not compliant with medications and treatment plan. Patient guarded, suspicious, but cooperative. Patient isolative,avoids with other patients on the unit.  Patient remains safe at this time.

## 2018-10-19 NOTE — [facility] Counselor (Signed)
CSW followed up with the patients guardian regarding faxing signed consents to this CSW to allow for aftercare appointments as well as for CSW to speak with group home director Victory Dakin.  CSW was informed that the pt's mother did not like the review of [facility] and would like another provider in the area.  CSW discussed [facility] and re-faxed consents to mother.   Penni Homans, MSW, LCSW 10/19/2018 11:28 AM
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
## 2018-10-19 NOTE — Progress Notes (Signed)
Patient this evening has been disorganized in his thought process, pressured in his speech, and visibly agitated talking to staff and on the phone. Pt. Has not been aggressive with staff currently, but is agitated. Pt. Has been intrusive to staff up at the nursing station making several demands of staff to allow him to hold scheduled meetings with his investors to filing warrants against the police officers that he claims IVC'd him to the psychiatric ward. Pt. Is mostly redirectable with support and encouragement.

## 2018-10-19 NOTE — Plan of Care (Signed)
Pt. Denies si/hi/avh, can contract for safety with this Clinical research associate. Pt. Continues to present disorganized in his thought process and resistant to care, not complying with treatment per MD orders.    Problem: Education: Goal: Will be free of psychotic symptoms Outcome: Not Progressing   Problem: Safety: Goal: Periods of time without injury will increase Outcome: Progressing

## 2018-10-19 NOTE — Progress Notes (Signed)
Recreation Therapy Notes  INPATIENT RECREATION THERAPY ASSESSMENT  Patient Details Name: Haley Nahar MRN: [id number] DOB: 22-Sep-1985 Today's Date: 10/19/2018       Information Obtained From: Patient  Able to Participate in Assessment/Interview: Yes  Patient Presentation: Responsive  Reason for Admission (Per Patient): Active Symptoms, Other (Comments)(Group home manager called the cops because I was praise dancing)  Patient Stressors:    Coping Skills:   Deep Breathing, Prayer, Other (Comment)(Laughing)  Leisure Interests (2+):  Music - Write music, Social - Friends, Games - Games developer)  Frequency of Recreation/Participation: Pharmacist, community Resources:     [employer]:     Current Use:    If no, Barriers?:    Expressed Interest in [employer] Information:    [employer] of Residence:  Film/video editor  Patient Main Form of Transportation: Bicycle  Patient Strengths:  Dedicated, truthful, dependable  Patient Identified Areas of Improvement:  Learn to be more patient  Patient Goal for Hospitalization:  Independence and to live independently   Current SI (including self-harm):  No  Current HI:  No  Current AVH: No  Staff Intervention Plan: Group Attendance, Collaborate with Interdisciplinary Treatment Team  Consent to Intern Participation: N/A  Tammera Engert 10/19/2018, 12:11 PM

## 2018-10-19 NOTE — [facility] Group Notes (Signed)
LCSW Group Therapy Note  10/19/2018 1:00 PM  Type of Therapy/Topic:  Group Therapy:  Feelings about Diagnosis  Participation Level:  Minimal   Description of Group:   This group will allow patients to explore their thoughts and feelings about diagnoses they have received. Patients will be guided to explore their level of understanding and acceptance of these diagnoses. Facilitator will encourage patients to process their thoughts and feelings about the reactions of others to their diagnosis and will guide patients in identifying ways to discuss their diagnosis with significant others in their lives. This group will be process-oriented, with patients participating in exploration of their own experiences, giving and receiving support, and processing challenge from other group members.   Therapeutic Goals: 1. Patient will demonstrate understanding of diagnosis as evidenced by identifying two or more symptoms of the disorder 2. Patient will be able to express two feelings regarding the diagnosis 3. Patient will demonstrate their ability to communicate their needs through discussion and/or role play  Summary of Patient Progress: Patient was present for group, however, did not engage in group discussions. Patient was supportive of other group members and appeared to follow group discussion.   Therapeutic Modalities:   Cognitive Behavioral Therapy Brief Therapy Feelings Identification   Penni Homans, MSW, LCSW 10/19/2018 11:29 AM

## 2018-10-19 NOTE — Progress Notes (Signed)
Patient educated on the need for EKG and urine sampling, but continues to refuse these testings, despite extensive patient education. MD is aware of patient's resistance to care.

## 2018-10-19 NOTE — Progress Notes (Signed)
[facility] MD Progress Note  10/19/2018 6:11 PM Jackson [name]  MRN:  [id number] Subjective: Follow-up for this patient with bipolar disorder.  Patient seen chart reviewed.  Patient consistently is refusing medication.  Insight is nil.  Continues to be euphoric and disorganized paranoid and delusional.  Has not been violent aggressive or threatening.  Takes care of his basic ADLs okay. Principal Problem: Schizoaffective disorder, bipolar type (HCC) Diagnosis: Principal Problem:   Schizoaffective disorder, bipolar type (HCC) Active Problems:   Noncompliance  Total Time spent with patient: 30 minutes  Past Psychiatric History: Past history of severe bipolar disorder including lengthy state hospitalization.  Past Medical History:  Past Medical History:  Diagnosis Date  . Bipolar 1 disorder (HCC)   . Schizoaffective disorder (HCC)    History reviewed. No pertinent surgical history. Family History: History reviewed. No pertinent family history. Family Psychiatric  History: See previous Social History:  Social History   Substance and Sexual Activity  Alcohol Use Not on file     Social History   Substance and Sexual Activity  Drug Use Not on file    Social History   Socioeconomic History  . Marital status: Single    Spouse name: Not on file  . Number of children: Not on file  . Years of education: Not on file  . Highest education level: Not on file  Occupational History  . Not on file  Social Needs  . Financial resource strain: Not on file  . Food insecurity:    Worry: Not on file    Inability: Not on file  . Transportation needs:    Medical: Not on file    Non-medical: Not on file  Tobacco Use  . Smoking status: Light Tobacco Smoker    Packs/day: 0.25    Types: Cigarettes  . Smokeless tobacco: Never Used  Substance and Sexual Activity  . Alcohol use: Not on file  . Drug use: Not on file  . Sexual activity: Not on file  Lifestyle  . Physical activity:    Days per  week: Not on file    Minutes per session: Not on file  . Stress: Not on file  Relationships  . Social connections:    Talks on phone: Not on file    Gets together: Not on file    Attends religious service: Not on file    Active member of club or organization: Not on file    Attends meetings of clubs or organizations: Not on file    Relationship status: Not on file  Other Topics Concern  . Not on file  Social History Narrative  . Not on file   Additional Social History:                         Sleep: Fair  Appetite:  Fair  Current Medications: Current Facility-Administered Medications  Medication Dose Route Frequency Provider Last Rate Last Dose  . acetaminophen (TYLENOL) tablet 650 mg  650 mg Oral Q6H PRN [name], MD      . alum & mag hydroxide-simeth (MAALOX/MYLANTA) 200-200-20 MG/5ML suspension 30 mL  30 mL Oral Q4H PRN Katheran Awe C, MD      . haloperidol lactate (HALDOL) injection 5 mg  5 mg Intramuscular Q4H PRN [name], MD       And  . diphenhydrAMINE (BENADRYL) injection 50 mg  50 mg Intramuscular Q4H PRN [name], MD  And  . LORazepam (ATIVAN) injection 2 mg  2 mg Intramuscular Q4H PRN [name], MD      . divalproex (DEPAKOTE ER) 24 hr tablet 1,500 mg  1,500 mg Oral QHS Katheran Awe C, MD      . divalproex (DEPAKOTE) DR tablet 500 mg  500 mg Oral Q12H Brittian Renaldo T, MD      . hydrOXYzine (ATARAX/VISTARIL) tablet 50 mg  50 mg Oral Q4H PRN Katheran Awe C, MD      . magnesium hydroxide (MILK OF MAGNESIA) suspension 30 mL  30 mL Oral Daily PRN [name], MD      . OLANZapine zydis (ZYPREXA) disintegrating tablet 20 mg  20 mg Oral QHS Milburn Freeney T, MD      . traZODone (DESYREL) tablet 100 mg  100 mg Oral QHS PRN [name], MD        Lab Results: No results found for this or any previous visit (from the past 48 hour(s)).  Blood Alcohol level:  Lab Results  Component  Value Date   ETH <10 10/16/2018    Metabolic Disorder Labs: No results found for: HGBA1C, MPG No results found for: PROLACTIN No results found for: CHOL, TRIG, HDL, CHOLHDL, VLDL, LDLCALC  Physical Findings: AIMS: Facial and Oral Movements Muscles of Facial Expression: None, normal Lips and Perioral Area: None, normal Jaw: None, normal Tongue: None, normal,Extremity Movements Upper (arms, wrists, hands, fingers): None, normal Lower (legs, knees, ankles, toes): None, normal, Trunk Movements Neck, shoulders, hips: None, normal, Overall Severity Severity of abnormal movements (highest score from questions above): None, normal Incapacitation due to abnormal movements: None, normal Patient's awareness of abnormal movements (rate only patient's report): No Awareness, Dental Status Current problems with teeth and/or dentures?: No Does patient usually wear dentures?: No  CIWA:  CIWA-Ar Total: 0 COWS:  COWS Total Score: 0  Musculoskeletal: Strength & Muscle Tone: within normal limits Gait & Station: normal Patient leans: N/A  Psychiatric Specialty Exam: Physical Exam  Nursing note and vitals reviewed. Constitutional: He appears well-developed and well-nourished.  HENT:  Head: Normocephalic and atraumatic.  Eyes: Pupils are equal, round, and reactive to light. Conjunctivae are normal.  Neck: Normal range of motion.  Cardiovascular: Regular rhythm and normal heart sounds.  Respiratory: Effort normal. No respiratory distress.  GI: Soft.  Musculoskeletal: Normal range of motion.  Neurological: He is alert.  Skin: Skin is warm and dry.  Psychiatric: His affect is inappropriate. His speech is tangential. He is agitated. He is not aggressive. Cognition and memory are impaired. He expresses impulsivity and inappropriate judgment.    Review of Systems  Constitutional: Negative.   HENT: Negative.   Eyes: Negative.   Respiratory: Negative.   Cardiovascular: Negative.    Gastrointestinal: Negative.   Musculoskeletal: Negative.   Skin: Negative.   Neurological: Negative.   Psychiatric/Behavioral: Negative.     Blood pressure 111/80, pulse (!) 58, temperature (!) 97.5 F (36.4 C), temperature source Oral, resp. rate 16, height 5\' 11"  (1.803 m), weight 88.5 kg, SpO2 100 %.Body mass index is 27.2 kg/m.  General Appearance: Casual  Eye Contact:  Fair  Speech:  Clear and Coherent  Volume:  Normal  Mood:  Euphoric  Affect:  Labile  Thought Process:  Disorganized  Orientation:  Full (Time, Place, and Person)  Thought Content:  Delusions, Paranoid Ideation and Tangential  Suicidal Thoughts:  No  Homicidal Thoughts:  No  Memory:  Immediate;   Fair Recent;  Poor Remote;   Poor  Judgement:  Impaired  Insight:  Lacking  Psychomotor Activity:  Increased  Concentration:  Concentration: Fair  Recall:  [employer]  Fund of Knowledge:  Fair  Language:  Fair  Akathisia:  No  Handed:  Right  AIMS (if indicated):     Assets:  Physical Health  ADL's:  Impaired  Cognition:  Impaired,  Mild  Sleep:  Number of Hours: 6     Treatment Plan Summary: Daily contact with patient to assess and evaluate symptoms and progress in treatment, Medication management and Plan Tried to talk with the patient today to see if we could reason together to get him to agree to take medicine.  He has no insight and refuses to acknowledge any problem.  Patient will require forced medication starting tomorrow.  Jackson RasmussenJohn Dailon Sheeran, MD 10/19/2018, 6:11 PM

## 2018-10-19 NOTE — Progress Notes (Signed)
Recreation Therapy Notes  Date: 10/19/2018  Time: 9:30 am  Location: Craft Room  Behavioral response: Not engaged   Intervention Topic: Time Management  Discussion/Intervention:  Group content today was focused on time management. The group defined time management and identified healthy ways to manage time. Individuals expressed how much of the 24 hours they use in a day. Patients expressed how much time they use just for themselves personally. The group expressed how they have managed their time in the past. Individuals participated in the intervention "Managing Life" where they had a chance to see how much of the 24 hours they use and where it goes. Clinical Observations/Feedback:  Patient came to group late and laid his head down. Individual was did not participate in group.   Lanita Stammen LRT/CTRS           Shenea Giacobbe 10/19/2018 11:17 AM

## 2018-10-19 NOTE — Progress Notes (Signed)
D: Pt during assessment denies SI/HI/AVH, contracts for safety. Pt is pleasant wihen engaging with this Clinical research associate, but is adamant he will not take medications (MD is aware). Pt. In his thought process is disorganized, mildly pressured in his speech, and presents with some paranoia to this Clinical research associate, from the statements and questions he asks staff and peers.    A: Q x 15 minute observation checks to be completed for safety. Patient was provided with education, but is non-accepting of this. Patient was given/offered medications per orders. Patient  was encourage to attend groups, participate in unit activities and continue with plan of care. Pt. Chart and plans of care reviewed. Pt. Given support and encouragement.   R: Patient is not complaint with medications despite extensive patient education given. Pt. Does not participate much with activities.

## 2018-10-20 MED ORDER — LORAZEPAM 2 MG/ML IJ SOLN
2.0000 mg | Freq: Two times a day (BID) | INTRAMUSCULAR | Status: DC | PRN
  Filled 2018-10-20: qty 1

## 2018-10-20 MED ORDER — OLANZAPINE 5 MG PO TBDP
10.0000 mg | ORAL_TABLET | Freq: Two times a day (BID) | ORAL | Status: DC
  Administered 2018-10-21 – 2018-10-23 (×5): 10 mg via ORAL
  Filled 2018-10-20 (×6): qty 2

## 2018-10-20 MED ORDER — OLANZAPINE 10 MG IM SOLR
15.0000 mg | Freq: Two times a day (BID) | INTRAMUSCULAR | Status: DC | PRN
  Administered 2018-10-20: 15 mg via INTRAMUSCULAR
  Filled 2018-10-20 (×3): qty 20

## 2018-10-20 NOTE — Progress Notes (Signed)
Patient was with staff member and advised he was all most done. Staff member asked if I mind waiting. Chaplain waited for few moment patient then came out of room refused chaplain service.

## 2018-10-20 NOTE — Progress Notes (Signed)
Patient alert and oriented x 4, denies SI/HI/AVH no distress noted, affect is flat he appears angry, irritable towards staff, he approached Clinical research associate and asked " why are my getting force medication tomorrowPatent attorney responded because you are not taking  your medications " he  asked if he could speak to someone over Dr Antionette Poles because he does not agree with him, Clinical research associate explained that she cant reach that person now but reminded him that he is Involuntarily committed and that he has to agree to the care he is been provided with by the HCP, as long as it legal. Patient was upset , kept pacing the unit , he made several phone calls, he was very loud using profanities and was threatening the staff by saying " if l am forced med tomorrow l will raise hell and destroy things "  Patient is not interacting appropriately with peers and staff. 15 minutes safety checks maintained will continue to monitor.

## 2018-10-20 NOTE — Progress Notes (Signed)
Recreation Therapy Notes    Date: 10/20/2018  Time: 9:30 am  Location: Craft Room  Behavioral response: Hyperactive, Hyperverbal, Hyper religious  Intervention Topic: Coping skills  Discussion/Intervention:  Group content on today was focused on coping skills. The group defined what coping skills are and when they can be used. Individuals described how they normally cope with thing and the coping skills they normally use. Patients expressed why it is important to cope with things and how not coping with things can affect you. The group participated in the intervention "Exploring coping skills" where they had a chance to test new coping skills they could use in the future.  Clinical Observations/Feedback:  Patient came to group and defined coping skills as redemption. He explained the devil is a lie and when getting attacked by something you must fight to win and pray. Participant stated coping is freedom, clear mind, joy and happiness. Patient expressed that he copes with things by using his words, Jesus and praise dancing. He explained that coping skills are important so he can deal with things the devil throws at him. Individual expressed that the Doctor has misdiagnosed him with bipolar and he should be diagnosed with joy instead. Patient explained this is the first time he has heard he was bipolar, and it is just joy. Participant began singing and praise dancing in group. Shareena Nusz LRT/CTRS         Morrigan Wickens 10/20/2018 12:04 PM

## 2018-10-20 NOTE — Progress Notes (Signed)
D: Patient  Stated he is allergic to all medication . Continue to voice of  The unit  Breaking   His rights  . Patient  Stated he is not refusing  But can't take the medication . Noted very argumentative  With Clinical research associate  This am . Stated he wanted to talk to the board of directors.   A: Instructed  Patient to talk with the MD this am for clarification  R: Staff continue to monitor  Behavior

## 2018-10-20 NOTE — Progress Notes (Signed)
[facility] MD Progress Note  10/20/2018 11:16 AM Jackson [name]  MRN:  [id number] Subjective: Follow-up for this patient was schizoaffective disorder.  Patient remains hyperactive hyperverbal emotionally labile and inappropriately euphoric and delusional.  He also continues to refuse medication.  Multiple efforts have been made by treatment team to discuss the rationale for medication with this patient.  No new physical complaints or problems evident Principal Problem: Schizoaffective disorder, bipolar type (HCC) Diagnosis: Principal Problem:   Schizoaffective disorder, bipolar type (HCC) Active Problems:   Noncompliance  Total Time spent with patient: 30 minutes  Past Psychiatric History: Patient has a history of schizoaffective or bipolar disorder which resulted [facility].  Was on medication until a few months ago  Past Medical History:  Past Medical History:  Diagnosis Date  . Bipolar 1 disorder (HCC)   . Schizoaffective disorder (HCC)    History reviewed. No pertinent surgical history. Family History: History reviewed. No pertinent family history. Family Psychiatric  History: None known Social History:  Social History   Substance and Sexual Activity  Alcohol Use Not on file     Social History   Substance and Sexual Activity  Drug Use Not on file    Social History   Socioeconomic History  . Marital status: Single    Spouse name: Not on file  . Number of children: Not on file  . Years of education: Not on file  . Highest education level: Not on file  Occupational History  . Not on file  Social Needs  . Financial resource strain: Not on file  . Food insecurity:    Worry: Not on file    Inability: Not on file  . Transportation needs:    Medical: Not on file    Non-medical: Not on file  Tobacco Use  . Smoking status: Light Tobacco Smoker    Packs/day: 0.25    Types: Cigarettes  . Smokeless tobacco: Never Used  Substance and  Sexual Activity  . Alcohol use: Not on file  . Drug use: Not on file  . Sexual activity: Not on file  Lifestyle  . Physical activity:    Days per week: Not on file    Minutes per session: Not on file  . Stress: Not on file  Relationships  . Social connections:    Talks on phone: Not on file    Gets together: Not on file    Attends religious service: Not on file    Active member of club or organization: Not on file    Attends meetings of clubs or organizations: Not on file    Relationship status: Not on file  Other Topics Concern  . Not on file  Social History Narrative  . Not on file   Additional Social History:                         Sleep: Fair  Appetite:  Good  Current Medications: Current Facility-Administered Medications  Medication Dose Route Frequency Provider Last Rate Last Dose  . acetaminophen (TYLENOL) tablet 650 mg  650 mg Oral Q6H PRN [name], MD      . alum & mag hydroxide-simeth (MAALOX/MYLANTA) 200-200-20 MG/5ML suspension 30 mL  30 mL Oral Q4H PRN Katheran Awe C, MD      . haloperidol lactate (HALDOL) injection 5 mg  5 mg Intramuscular Q4H PRN [name], MD       And  .  diphenhydrAMINE (BENADRYL) injection 50 mg  50 mg Intramuscular Q4H PRN Terance HartStephens, [name], MD       And  . LORazepam (ATIVAN) injection 2 mg  2 mg Intramuscular Q4H PRN Terance HartStephens, [name], MD      . divalproex (DEPAKOTE ER) 24 hr tablet 1,500 mg  1,500 mg Oral QHS Katheran AweStephens, [name], MD      . divalproex (DEPAKOTE) DR tablet 500 mg  500 mg Oral Q12H [name], [name], MD      . hydrOXYzine (ATARAX/VISTARIL) tablet 50 mg  50 mg Oral Q4H PRN Katheran AweStephens, [name], MD      . magnesium hydroxide (MILK OF MAGNESIA) suspension 30 mL  30 mL Oral Daily PRN Terance HartStephens, [name], MD      . OLANZapine zydis (ZYPREXA) disintegrating tablet 20 mg  20 mg Oral QHS [name], [name], MD      . traZODone (DESYREL) tablet 100 mg  100 mg Oral QHS PRN Terance HartStephens, [name], MD         Lab Results: No results found for this or any previous visit (from the past 48 hour(s)).  Blood Alcohol level:  Lab Results  Component Value Date   ETH <10 10/16/2018    Metabolic Disorder Labs: No results found for: HGBA1C, MPG No results found for: PROLACTIN No results found for: CHOL, TRIG, HDL, CHOLHDL, VLDL, LDLCALC  Physical Findings: AIMS: Facial and Oral Movements Muscles of Facial Expression: None, normal Lips and Perioral Area: None, normal Jaw: None, normal Tongue: None, normal,Extremity Movements Upper (arms, wrists, hands, fingers): None, normal Lower (legs, knees, ankles, toes): None, normal, Trunk Movements Neck, shoulders, hips: None, normal, Overall Severity Severity of abnormal movements (highest score from questions above): None, normal Incapacitation due to abnormal movements: None, normal Patient's awareness of abnormal movements (rate only patient's report): No Awareness, Dental Status Current problems with teeth and/or dentures?: No Does patient usually wear dentures?: No  CIWA:  CIWA-Ar Total: 0 COWS:  COWS Total Score: 0  Musculoskeletal: Strength & Muscle Tone: within normal limits Gait & Station: normal Patient leans: N/A  Psychiatric Specialty Exam: Physical Exam  Nursing note and vitals reviewed. Constitutional: He appears well-developed and well-nourished.  HENT:  Head: Normocephalic and atraumatic.  Eyes: Pupils are equal, round, and reactive to light. Conjunctivae are normal.  Neck: Normal range of motion.  Cardiovascular: Regular rhythm and normal heart sounds.  Respiratory: Effort normal. No respiratory distress.  GI: Soft.  Musculoskeletal: Normal range of motion.  Neurological: He is alert.  Skin: Skin is warm and dry.  Psychiatric: His affect is labile and inappropriate. His speech is tangential. He is agitated. He is not aggressive. Thought content is delusional. Cognition and memory are impaired. He expresses  impulsivity.    Review of Systems  Constitutional: Negative.   HENT: Negative.   Eyes: Negative.   Respiratory: Negative.   Cardiovascular: Negative.   Gastrointestinal: Negative.   Musculoskeletal: Negative.   Skin: Negative.   Neurological: Negative.   Psychiatric/Behavioral: Negative.     Blood pressure 120/84, pulse 60, temperature 98.4 F (36.9 C), temperature source Oral, resp. rate 16, height 5\' 11"  (1.803 m), weight 88.5 kg, SpO2 100 %.Body mass index is 27.2 kg/m.  General Appearance: Fairly Groomed  Eye Contact:  Good  Speech:  Pressured  Volume:  Increased  Mood:  Euphoric  Affect:  Inappropriate  Thought Process:  Disorganized  Orientation:  Full (Time, Place, and Person)  Thought Content:  Illogical, Delusions and Tangential  Suicidal Thoughts:  No  Homicidal Thoughts:  No  Memory:  Immediate;   Fair Recent;   Poor Remote;   Poor  Judgement:  Impaired  Insight:  Shallow  Psychomotor Activity:  Increased  Concentration:  Concentration: Poor  Recall:  Poor  Fund of Knowledge:  Fair  Language:  Fair  Akathisia:  No  Handed:  Right  AIMS (if indicated):     Assets:  Physical Health Resilience Social Support  ADL's:  Impaired  Cognition:  Impaired,  Mild  Sleep:  Number of Hours: 5     Treatment Plan Summary: Daily contact with patient to assess and evaluate symptoms and progress in treatment, Medication management and Plan Patient has continued to refuse medicine because of his poor insight and refusal to acknowledge his mental illness.  Patient is unlikely to be able to regain capacity or ability to function without medication.  Unable to engage in appropriate conversation about his treatment without medication.  Based on this I am recommending a forced medication regimen.  We will continue the current orders for oral medication but for each dose of refuse medication we will replace it with intramuscular olanzapine and Ativan.  Patient aware of the plan.   Reviewed with treatment team.  I will reach out to other physicians to see if I can get a second opinion to support this.  No other specific change to treatment plan.  The patient's mother telephone today and left a message asking me to give her a call which I will do.  She is his legal guardian.  Mordecai Rasmussen, MD 10/20/2018, 11:16 AM

## 2018-10-20 NOTE — Plan of Care (Signed)
Patient knowledgeable  of information received ,able to verbalize understanding . Having difficulties  verbalizing information  noted to get frustrated  presenting his problems . No safety concerns . Thought process remained altered . Non compliant  with medication . Problem: Education: Goal: Knowledge of [facility] [facility] Education information/materials will improve Outcome: Progressing   Problem: Coping: Goal: Ability to verbalize frustrations and anger appropriately will improve Outcome: Progressing Goal: Ability to demonstrate self-control will improve Outcome: Progressing   Problem: Safety: Goal: Periods of time without injury will increase Outcome: Progressing   Problem: Education: Goal: Will be free of psychotic symptoms Outcome: Progressing Goal: Knowledge of the prescribed therapeutic regimen will improve Outcome: Progressing

## 2018-10-20 NOTE — BHH Counselor (Signed)
CSW met with the patient at the patient's request.  CSW spoke with the patient for an hour regarding the patient concerns for his rights.  Patient began the conversation with expressing concerns that "I;m being persecuted for my religion.  My group home leader is an atheist."  Pt then provided an unsolicited example of praise dancing that he claims he was doing in the yard that "got me sent to the hospital".   Pt reports a belief that he is competent due to the ability to do ADL's and speak coherently.  PT requested that he go to the court being held in the hospital to advocate for the revocation of his guardianship.  CSW pointed out to pt several times that there is not a court in the hospital and that the pt would need to go to court outside of the hospital to address this.  CSW also pointed out that at this time the courts are closed to to concerns with Covid-19.  Patient identified that he wanted guardianship revoked, medications stopped, no information shared with family including his mother, records sealed, rights restored, re-diagnosed with a second opinion, and release from the hospital.  CSW attempted to address the patients concerns and explain the guardianship process and that mother has guardianship, however, patient became loud and angry as evidenced by body language.    CSW ended the meeting. Pt requested to speak with BMU Director and CSW informed her and provided backstory and explained the patient's behaviors.   Pt did mention a heart condition that CSW will message Dr. [name] about.    , MSW, LCSW 10/20/2018 11:22 AM  

## 2018-10-20 NOTE — Progress Notes (Signed)
D: Writer approached patient for medication instructed on if he didn't take his am medications he would receive IM forced medications Continue to voice of his rights being challenged Again  Clinical research associate  Instructed patient  If he didn't  Take the oral he would have to take the IM . Patient proceeded to bring his paper work to nursing station to instructed on his rights .  A: Security  Escorted patient to his room  Patient received IM injection  R: Patient voice no other concerns . Staff continue to monitor

## 2018-10-20 NOTE — Progress Notes (Signed)
D: Informed  MD [name] with Director of patient grieves issues . Patient stated he was not a danger to himself or others. Stated he has not done anything to warrant  Medication .  Patient had previously refused to take his Depakote this am shift .  A Requested chaplin  And to talk to Interior and spatial designer and Child psychotherapist .Able to speak to staff R: Staff continue to monitor

## 2018-10-20 NOTE — Plan of Care (Signed)
  Problem: Education: Goal: Will be free of psychotic symptoms 10/20/2018 0453 by Trula Ore, RN Outcome: Progressing 10/20/2018 0453 by Trula Ore, RN Outcome: Progressing  Patient thought are still disorganized but he is not as psychotic as he was days ago.

## 2018-10-20 NOTE — Progress Notes (Signed)
D:Patient  continue to go off on a tangent about not being able to take medications .  Verbalize" Francesca Oman gon na have to kill me today cause aint  taking medications". Patient going  From staff to staff to tell them his story . Informed Clinical research associate he wants to be taken off insurance . " I want all my information redacted "  A: Patient requested some one to talk to . Patient talking to at present  To Arts development officer. R: Staff continue to monitor

## 2018-10-20 NOTE — BHH Counselor (Signed)
CSW spoke with the patients mother and guardian.  CSW updated on the patients behaviors on the unit.  CSW also reviewed paperwork that is needed and for mother to send to CSW.   Penni Homans, MSW, LCSW 10/20/2018 3:52 PM

## 2018-10-20 NOTE — Progress Notes (Addendum)
[facility] Second Physician Opinion Progress Note for Medication Administration to Non-consenting Patients (For Involuntarily Committed Patients)  Patient: Jackson [name] Date of Birth: [id number] MRN: [id number]  Reason for the Medication: The patient, without the benefit of the specific treatment measure, is incapable of participating in any available treatment plan that will give the patient a realistic opportunity of improving the patient's condition.  Consideration of Side Effects: Consideration of the side effects related to the medication plan has been given.  Rationale for Medication Administration: Patient has history of schizoaffective disorder, bipolar type and is currently displaying grandiosity, hyper-religiosity, paranoia and rapid speech process.  He's quite circumstantial throughout the evaluation.  Patient reported that for several years the free masons and the police have been following him.  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  Patient was given the opportunity to voice his concerns and opinions.  He was given the opportunity to take medication willfully.   Patient has been refusing medications for his psychiatric condition.  At this point, the patient, without the benefit of medication, will likely not improve psychologically.    Additional information: Patient states "I was doing praise and worship in my front yard.Marland KitchenMarland KitchenI think my group home manager is an atheist..he called the police on me three times.  Patient also made comments that he completed 2 years of law school, but finally admitted that he only took criminal justice courses at the [employer].   I spoke with Patient's mother, Aggie Cosier Loden [phone], who is also pt's legal guardian.  She states that patient is currently in a manic state.  She states the patient has been cursing at her. 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  She  consents/agrees to the use of medications to treat patient's current psychiatric condition.       Hessie Knows, MD 10/20/18  2:37 PM   This documentation is good for (7) seven days from the date of the MD signature. New documentation must be completed every seven (7) days with detailed justification in the medical record if the patient requires continued non-emergent administration of psychotropic medications.

## 2018-10-20 NOTE — BHH Group Notes (Signed)
Emotional Regulation 10/20/2018 1PM  Type of Therapy/Topic:  Group Therapy:  Emotion Regulation  Participation Level:  Minimal   Description of Group:   The purpose of this group is to assist patients in learning to regulate negative emotions and experience positive emotions. Patients will be guided to discuss ways in which they have been vulnerable to their negative emotions. These vulnerabilities will be juxtaposed with experiences of positive emotions or situations, and patients will be challenged to use positive emotions to combat negative ones. Special emphasis will be placed on coping with negative emotions in conflict situations, and patients will process healthy conflict resolution skills.  Therapeutic Goals: 1. Patient will identify two positive emotions or experiences to reflect on in order to balance out negative emotions 2. Patient will label two or more emotions that they find the most difficult to experience 3. Patient will demonstrate positive conflict resolution skills through discussion and/or role plays  Summary of Patient Progress:  Pt came at the end of group but did identify dancing as healthy coping mechanism to manage emotions. Pt also participated in group icebreaker.     Therapeutic Modalities:   Cognitive Behavioral Therapy Feelings Identification Dialectical Behavioral Therapy   Suzan Slick, LCSW 10/20/2018 2:05 PM

## 2018-10-21 MED ORDER — OLANZAPINE 10 MG IM SOLR
10.0000 mg | Freq: Four times a day (QID) | INTRAMUSCULAR | Status: DC | PRN
  Filled 2018-10-21: qty 10

## 2018-10-21 NOTE — Progress Notes (Signed)
D - Patient was in his room upon arrival to the unit. Patient was asleep most of the shift. Patient did get up and want his meal tray at 2100. Patient was observed shuffling his feet and appearing to have an uneven gate as he walked down the hall. When assessed the patient stated to this writer, "they forced meds on me and I haven't felt right since." Patient was slurring his words. Patient ate his meal and went back to sleep. Patient medication this evening was held due to the patient being medicated earlier in the day and being drowsy upon waking up.   A - Patient was given education. Patient was given support and encouragement. Patient informed to let staff know if there are any issues or problems on the unit.   R - Patient being monitored Q 15 minutes for safety per unit protocol. Patient remains safe on the unit.

## 2018-10-21 NOTE — BHH Group Notes (Signed)
LCSW Group Therapy Note  10/21/2018 12:41 PM  Type of Therapy/Topic:  Group Therapy:  Balance in Life  Participation Level:  Did Not Attend  Description of Group:    This group will address the concept of balance and how it feels and looks when one is unbalanced. Patients will be encouraged to process areas in their lives that are out of balance and identify reasons for remaining unbalanced. Facilitators will guide patients in utilizing problem-solving interventions to address and correct the stressor making their life unbalanced. Understanding and applying boundaries will be explored and addressed for obtaining and maintaining a balanced life. Patients will be encouraged to explore ways to assertively make their unbalanced needs known to significant others in their lives, using other group members and facilitator for support and feedback.  Therapeutic Goals: 1. Patient will identify two or more emotions or situations they have that consume much of in their lives. 2. Patient will identify signs/triggers that life has become out of balance:  3. Patient will identify two ways to set boundaries in order to achieve balance in their lives:  4. Patient will demonstrate ability to communicate their needs through discussion and/or role plays  Summary of Patient Progress:  x    Therapeutic Modalities:   Cognitive Behavioral Therapy Solution-Focused Therapy Assertiveness Training  Iris Pert, MSW, LCSW Clinical Social Work 10/21/2018 12:41 PM

## 2018-10-21 NOTE — Progress Notes (Signed)
Recreation Therapy Notes  Date: 10/21/2018  Time: 9:30 am   Location: Craft room   Behavioral response: N/A   Intervention Topic: Happiness  Discussion/Intervention: Patient did not attend group.   Clinical Observations/Feedback:  Patient did not attend group.   Corben Auzenne LRT/CTRS        Brighid Koch 10/21/2018 10:25 AM

## 2018-10-21 NOTE — Progress Notes (Signed)
Pastoral Care Visit    10/21/18 1540  Clinical Encounter Type  Visited With Patient  Visit Type Follow-up;Psychological support;Spiritual support;Behavioral Health  Referral From Patient  Consult/Referral To Chaplain  Spiritual Encounters  Spiritual Needs Emotional  Stress Factors  Patient Stress Factors Family relationships;Loss of control;Health changes   Pt approached Chap to talk. Pt is exasperated over having to take meds.  He feels his rights have been violated. Pt claims to have requested with staff to meet with a member of the bioethics committee but has not been listened to. Pt read to the chap his pt rights from the [facility] literature in his room.  Pt expressed anger, dropped to ground crying, spitting, slapping his hand, beating his chest.  Mirna Mires tried to help pt understand that since his mother is his legal guardian, his rights, are limited.  Mirna Mires advised him to contact his friend, Trinna Post, who pt says is a IT consultant and willing to help him get emancipated.  Chap attempted to help pt see that his argument is not with hospital staff but with the law and/or his mother.  Pt calmed downed significantly by the end of our conversation (over 1 hr).  Pt feels like he is a slave and being mistreated as a slave.  Chap attempted to validate pt's feelings and help direct his energy toward long-term solutions rather than taking out his frustration on staff.    Milinda Antis, [address]

## 2018-10-21 NOTE — Plan of Care (Signed)
D- Patient alert and oriented. Patient presented in a very angry/irritable mood on assessment stating that he has a "leaky heart valve" and "y'all don't care about me, y'all just want me to be here and sleep for another six months". Patient did not want to listen to what this writer had to say, he just stated that "hurry up and give me my meds so I can go to sleep, I feel like I need to be restrained". Patient stated that the medication is "giving me hallucinations", however, he didn't state what exactly. Patient did not endorse any SI/HI or pain to this Clinical research associate. Patient had no stated goals for today.  A- Scheduled medications administered to patient, per MD orders. Support and encouragement provided.  Routine safety checks conducted every 15 minutes.  Patient informed to notify staff with problems or concerns.  R- No adverse drug reactions noted. Patient contracts for safety at this time. Patient compliant with medications and treatment plan.  Patient remains safe at this time.   Problem: Education: Goal: Knowledge of [facility] Education information/materials will improve Outcome: Not Progressing   Problem: Coping: Goal: Ability to verbalize frustrations and anger appropriately will improve Outcome: Not Progressing Goal: Ability to demonstrate self-control will improve Outcome: Not Progressing   Problem: Safety: Goal: Periods of time without injury will increase Outcome: Not Progressing   Problem: Education: Goal: Will be free of psychotic symptoms Outcome: Not Progressing Goal: Knowledge of the prescribed therapeutic regimen will improve Outcome: Not Progressing

## 2018-10-21 NOTE — Progress Notes (Signed)
[facility] MD Progress Note  10/21/2018 4:29 PM Jackson [name]  MRN:  [id number] Subjective: Follow-up for this patient with schizoaffective bipolar type.  Patient received his fourth medicines last night.  Since then he has been even more irritable.  Patient was angry and irritable much of the day.  Confronted me several times.  Wanted to argue about various things such as his alleges at "leaky valve".  I tried to engage him in appropriate conversation and he just became more angry and disorganized.  Showing multiple symptoms of mania to the point that his disruptiveness has forced the staff to isolate him on the backboard at least part of the day.  No specific physical complaints.  Seems to be medically stable.  Still psychotic. Principal Problem: Schizoaffective disorder, bipolar type (HCC) Diagnosis: Principal Problem:   Schizoaffective disorder, bipolar type (HCC) Active Problems:   Noncompliance  Total Time spent with patient: 30 minutes  Past Psychiatric History: Patient has a history of bipolar or schizoaffective disorder with extended hospitalizations and aggression and threatening behavior  Past Medical History:  Past Medical History:  Diagnosis Date  . Bipolar 1 disorder (HCC)   . Schizoaffective disorder (HCC)    History reviewed. No pertinent surgical history. Family History: History reviewed. No pertinent family history. Family Psychiatric  History: None known Social History:  Social History   Substance and Sexual Activity  Alcohol Use Not on file     Social History   Substance and Sexual Activity  Drug Use Not on file    Social History   Socioeconomic History  . Marital status: Single    Spouse name: Not on file  . Number of children: Not on file  . Years of education: Not on file  . Highest education level: Not on file  Occupational History  . Not on file  Social Needs  . Financial resource strain: Not on file  . Food insecurity:    Worry: Not on file   Inability: Not on file  . Transportation needs:    Medical: Not on file    Non-medical: Not on file  Tobacco Use  . Smoking status: Light Tobacco Smoker    Packs/day: 0.25    Types: Cigarettes  . Smokeless tobacco: Never Used  Substance and Sexual Activity  . Alcohol use: Not on file  . Drug use: Not on file  . Sexual activity: Not on file  Lifestyle  . Physical activity:    Days per week: Not on file    Minutes per session: Not on file  . Stress: Not on file  Relationships  . Social connections:    Talks on phone: Not on file    Gets together: Not on file    Attends religious service: Not on file    Active member of club or organization: Not on file    Attends meetings of clubs or organizations: Not on file    Relationship status: Not on file  Other Topics Concern  . Not on file  Social History Narrative  . Not on file   Additional Social History:                         Sleep: Fair  Appetite:  Fair  Current Medications: Current Facility-Administered Medications  Medication Dose Route Frequency Provider Last Rate Last Dose  . acetaminophen (TYLENOL) tablet 650 mg  650 mg Oral Q6H PRN [name], MD      . alum &  mag hydroxide-simeth (MAALOX/MYLANTA) 200-200-20 MG/5ML suspension 30 mL  30 mL Oral Q4H PRN Katheran Awe C, MD      . divalproex (DEPAKOTE) DR tablet 500 mg  500 mg Oral Q12H Chatara Lucente, [name], MD   500 mg at 10/21/18 0712  . hydrOXYzine (ATARAX/VISTARIL) tablet 50 mg  50 mg Oral Q4H PRN [name], MD      . LORazepam (ATIVAN) injection 2 mg  2 mg Intramuscular BID PRN Kamel Haven, [name], MD      . magnesium hydroxide (MILK OF MAGNESIA) suspension 30 mL  30 mL Oral Daily PRN [name], MD      . OLANZapine (ZYPREXA) injection 10 mg  10 mg Intramuscular Q6H PRN Marleni Gallardo T, MD      . OLANZapine (ZYPREXA) injection 15 mg  15 mg Intramuscular BID PRN Rolando Whitby, [name], MD   15 mg at 10/20/18 1539  . OLANZapine zydis  (ZYPREXA) disintegrating tablet 10 mg  10 mg Oral BID Maryetta Shafer, [name], MD   10 mg at 10/21/18 0814  . traZODone (DESYREL) tablet 100 mg  100 mg Oral QHS PRN [name], MD        Lab Results: No results found for this or any previous visit (from the past 48 hour(s)).  Blood Alcohol level:  Lab Results  Component Value Date   ETH <10 10/16/2018    Metabolic Disorder Labs: No results found for: HGBA1C, MPG No results found for: PROLACTIN No results found for: CHOL, TRIG, HDL, CHOLHDL, VLDL, LDLCALC  Physical Findings: AIMS: Facial and Oral Movements Muscles of Facial Expression: None, normal Lips and Perioral Area: None, normal Jaw: None, normal Tongue: None, normal,Extremity Movements Upper (arms, wrists, hands, fingers): None, normal Lower (legs, knees, ankles, toes): None, normal, Trunk Movements Neck, shoulders, hips: None, normal, Overall Severity Severity of abnormal movements (highest score from questions above): None, normal Incapacitation due to abnormal movements: None, normal Patient's awareness of abnormal movements (rate only patient's report): No Awareness, Dental Status Current problems with teeth and/or dentures?: No Does patient usually wear dentures?: No  CIWA:  CIWA-Ar Total: 0 COWS:  COWS Total Score: 0  Musculoskeletal: Strength & Muscle Tone: within normal limits Gait & Station: normal Patient leans: N/A  Psychiatric Specialty Exam: Physical Exam  Nursing note and vitals reviewed. Constitutional: He appears well-developed and well-nourished.  HENT:  Head: Normocephalic and atraumatic.  Eyes: Pupils are equal, round, and reactive to light. Conjunctivae are normal.  Neck: Normal range of motion.  Cardiovascular: Regular rhythm and normal heart sounds.  Respiratory: Effort normal. No respiratory distress.  GI: Soft.  Musculoskeletal: Normal range of motion.  Neurological: He is alert.  Skin: Skin is warm and dry.  Psychiatric: His affect  is angry, labile and inappropriate. His speech is rapid and/or pressured and tangential. He is agitated. Thought content is paranoid and delusional. Cognition and memory are impaired. He expresses impulsivity and inappropriate judgment.    Review of Systems  Constitutional: Negative.   HENT: Negative.   Eyes: Negative.   Respiratory: Negative.   Cardiovascular: Negative.   Gastrointestinal: Negative.   Musculoskeletal: Negative.   Skin: Negative.   Neurological: Negative.   Psychiatric/Behavioral: Negative.     Blood pressure 102/78, pulse (!) 58, temperature 98.1 F (36.7 C), temperature source Oral, resp. rate 18, height 5\' 11"  (1.803 m), weight 88.5 kg, SpO2 100 %.Body mass index is 27.2 kg/m.  General Appearance: Casual  Eye Contact:  Good  Speech:  Pressured  Volume:  Increased  Mood:  Angry and Irritable  Affect:  Inappropriate and Labile  Thought Process:  Disorganized  Orientation:  Full (Time, Place, and Person)  Thought Content:  Illogical, Ideas of Reference:   Paranoia, Paranoid Ideation, Rumination and Tangential  Suicidal Thoughts:  No  Homicidal Thoughts:  No  Memory:  Immediate;   Fair Recent;   Poor Remote;   Poor  Judgement:  Poor  Insight:  Lacking  Psychomotor Activity:  Increased  Concentration:  Concentration: Poor  Recall:  Poor  Fund of Knowledge:  Fair  Language:  Fair  Akathisia:  No  Handed:  Right  AIMS (if indicated):     Assets:  Architect Social Support  ADL's:  Impaired  Cognition:  Impaired,  Mild  Sleep:  Number of Hours: 7.5     Treatment Plan Summary: Daily contact with patient to assess and evaluate symptoms and progress in treatment, Medication management and Plan Patient is psychotic and agitated.  Fortunately he has not required actual physical fights to take the force medicines.  Last night apparently he became very sedated when he was given Benadryl along with the Ativan and  olanzapine.  There is no indication for that and I have clarified the orders.  He will still get Ativan and olanzapine as the fourth medicine if he refuses to take olanzapine and Depakote.  Continue with close monitoring of his behavior.  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  I asked the patient to give me information about where that was diagnosed and he told me that it was at "some hospital" and that he could not remember where it was but continued to get more angry at me about it.  I explained to him that he was not presenting with any symptoms of significant heart problems and that there was no reason for that to get in the way of his psychiatric medicine which just made him more angry.  Mordecai Rasmussen, MD 10/21/2018, 4:29 PM

## 2018-10-21 NOTE — BHH Counselor (Signed)
CSW checked in on patient following a rough morning with patient being upset, yelling and getting in staff face.   Pt was resting.  Pt requested a EKG.  CSW informed that this needed to be ordered by the doctor.  Pt was upset, however, asked for CSW to ask doctor for an EKG.  CSW informed that she would ask both doctor and nurse.    CSW did ask nurse who reports that the patient was previously told by the doctor that no EKG was necessary at this time and would not be ordered.    CSW relayed this to the patient who became upset and requested that this CSW research "leaky heart valve and what medications do to it".    Penni Homans, MSW, LCSW 10/21/2018 2:09 PM

## 2018-10-21 NOTE — Plan of Care (Signed)
Patient was unable to be assessed. Patient was asleep.   Problem: Coping: Goal: Ability to verbalize frustrations and anger appropriately will improve Outcome: Not Progressing Goal: Ability to demonstrate self-control will improve Outcome: Not Progressing

## 2018-10-21 NOTE — B[facility] Counselor (Signed)
CSW attempted to call the patient's group home [name], [phone], however was unable to speak with him and left a HIPAA compliant voicemail.  Penni Homans, MSW, LCSW 10/21/2018 4:02 PM

## 2018-10-21 NOTE — Progress Notes (Signed)
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 This Clinical research associate contacted MD stating that patient wants an EKG done and MD was ok with this happening. This Clinical research associate, along with the AD of the unit obtained an EKG on patient and the printout was placed in patient's chart.

## 2018-10-21 NOTE — B[facility] Counselor (Signed)
CSW spoke with the patient regarding his concerns.  Pt requested that he speak with mother as he wanted CSW to speak with group home worker and CSW explained that mother has only given permission for the CSW to speak with the owner.    Pt became upset and agitated on the phone and began to yell, clap hands and shout.  CSW attempted several times to redirect the patient however he did not or was not able to do so.  Patient was upset that mother and step-father would not provider permission at this time and that they were not agreeing with the patient's reports that he had a heart, blood and bone marrow issue.  Patient is upset and adamant that records from primary care physician be provided to this hospital due to concerns that medications are adversely affecting his heart.    CSW ended phone call due to patients behaviors and concerns that patient may become aggressive.   Penni Homans, MSW, LCSW 10/21/2018 3:51 PM

## 2018-10-22 NOTE — Progress Notes (Signed)
Patient is up at the medication room aggressive with staff, yelling, and arguing about taking medications that the patient is required to take. Patient placed on back hallway for safety at and or around this time.

## 2018-10-22 NOTE — Tx Team (Signed)
Interdisciplinary Treatment and Diagnostic Plan Update  10/22/2018 Time of Session: 8:30AM  [facility] [name] MRN: [id number]  Principal Diagnosis: Schizoaffective disorder, bipolar type [facility])  Secondary Diagnoses: Principal Problem:   Schizoaffective disorder, bipolar type (HCC) Active Problems:   Noncompliance   Current Medications:  Current Facility-Administered Medications  Medication Dose Route Frequency Provider Last Rate Last Dose  . acetaminophen (TYLENOL) tablet 650 mg  650 mg Oral Q6H PRN [name], MD      . alum & mag hydroxide-simeth (MAALOX/MYLANTA) 200-200-20 MG/5ML suspension 30 mL  30 mL Oral Q4H PRN [name], MD      . divalproex (DEPAKOTE) DR tablet 500 mg  500 mg Oral Q12H [name], [name], MD   500 mg at 10/22/18 0820  . hydrOXYzine (ATARAX/VISTARIL) tablet 50 mg  50 mg Oral Q4H PRN [name], MD      . LORazepam (ATIVAN) injection 2 mg  2 mg Intramuscular BID PRN [name], [name], MD      . magnesium hydroxide (MILK OF MAGNESIA) suspension 30 mL  30 mL Oral Daily PRN [name], MD      . OLANZapine (ZYPREXA) injection 10 mg  10 mg Intramuscular Q6H PRN [name], [name], MD      . OLANZapine (ZYPREXA) injection 15 mg  15 mg Intramuscular BID PRN [name], [name], MD   15 mg at 10/20/18 1539  . OLANZapine zydis (ZYPREXA) disintegrating tablet 10 mg  10 mg Oral BID [name], [name], MD   10 mg at 10/22/18 0820  . traZODone (DESYREL) tablet 100 mg  100 mg Oral QHS PRN [name], MD       PTA Medications: No medications prior to admission.    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   Physician Treatment Plan for Primary Diagnosis: Schizoaffective  disorder, bipolar type (HCC) Long Term Goal(s): Improvement in symptoms so as ready for discharge Improvement in symptoms so as ready for discharge   Short Term Goals: Ability to demonstrate self-control will improve Ability to identify and develop effective coping behaviors will improve Compliance with prescribed medications will improve  Medication Management: Evaluate patient's response, side effects, and tolerance of medication regimen.  Therapeutic Interventions: 1 to 1 Cis[place], Unit Group Groman and Medication administration.  Evaluation of Outcomes: Not Progressing  Physician Treatment Plan for Secondary Diagnosis: Principal Problem:   Schizoaffective disorder, bipolar type (HCC) Active Problems:   Noncompliance  Long Term Goal(s): Improvement in symptoms so as ready for discharge Improvement in symptoms so as ready for discharge   Short Term Goals: Ability to demonstrate self-control will improve Ability to identify and develop effective coping behaviors will improve Compliance with prescribed medications will improve     Medication Management: Evaluate patient's response, side effects, and tolerance of medication regimen.  Therapeutic Interventions: 1 to 1 Carel, Unit Group Lerch and Medication administration.  Evaluation of Outcomes: Not Progressing   RN Treatment Plan for Primary Diagnosis: Schizoaffective disorder, bipolar type (HCC) Long Term Goal(s): Knowledge of disease and therapeutic regimen to maintain health will improve  Short Term Goals: Ability to verbalize frustration and anger appropriately will improve, Ability to demonstrate self-control, Ability to participate in decision making will improve and Ability to verbalize feelings will improve  Medication Management: RN will administer medications as ordered by  provider, will assess and evaluate patient's response and provide education to patient for prescribed medication. RN will report any adverse  and/or side effects to prescribing provider.  Therapeutic Interventions: 1 on 1 counseling Gall[place], Psychoeducation, Medication administration, Evaluate responses to treatment, Monitor vital signs and CBGs as ordered, Perform/monitor CIWA, COWS, AIMS and Fall Risk screenings as ordered, Perform wound care treatments as ordered.  Evaluation of Outcomes: Not Progressing   LCSW Treatment Plan for Primary Diagnosis: Schizoaffective disorder, bipolar type (HCC) Long Term Goal(s): Safe transition to appropriate next level of care at discharge, Engage patient in therapeutic group addressing interpersonal concerns.  Short Term Goals: Engage patient in aftercare planning with referrals and resources, Increase social support, Increase ability to appropriately verbalize feelings, Increase emotional regulation and Facilitate acceptance of mental health diagnosis and concerns  Therapeutic Interventions: Assess for all discharge needs, 1 to 1 time with Social worker, Explore available resources and support systems, Assess for adequacy in community support network, Educate family and significant other(s) on suicide prevention, Complete Psychosocial Assessment, Interpersonal group therapy.  Evaluation of Outcomes: Not Progressing   Progress in Treatment: Attending groups: Yes. Participating in groups: Yes. Taking medication as prescribed: Yes.  Patient was placed on forced med order. Toleration medication: Yes. Family/Significant other contact made: Yes, individual(s) contacted:  CSW completed SPE with the patients mother.  Patient understands diagnosis: Yes. Discussing patient identified problems/goals with staff: Yes. Medical problems stabilized or resolved: Yes. Denies suicidal/homicidal ideation: Yes. Issues/concerns per patient self-inventory: No. Other: none  New problem(s) identified: No, Describe:  none  New Short Term/Long Term Goal(s): elimination of symptoms of psychosis, medication  management for mood stabilization; elimination of SI thoughts; development of comprehensive mental wellness plan.  Patient Goals:  "independence"  Discharge Plan or Barriers: Pt has a legal guardian, his mother. Mother reports concenrs that patient can not return to his group home at this time.   Reason for Continuation of Hospitalization: Aggression Delusions  Hallucinations Mania Medication stabilization  Estimated Length of Stay: 1-5 days  Recreational Therapy: Patient Stressors: N/A Patient Goal: Patient will focus on task/topic with 2 prompts from staff within 5 recreation therapy group [place]  Attendees: Patient: [name] 10/22/2018 1:40 PM  Physician: Dr. Toni Amend, MD 10/22/2018 1:40 PM  Nursing:  10/22/2018 1:40 PM  RN Care Manager: 10/22/2018 1:40 PM  Social Worker: Penni Homans, MSW, LCSW 10/22/2018 1:40 PM  Recreational Therapist:  10/22/2018 1:40 PM  Other:  10/22/2018 1:40 PM  Other:  10/22/2018 1:40 PM  Other: 10/22/2018 1:40 PM    Scribe for Treatment Team: Harden Mo, LCSW 10/22/2018 1:40 PM

## 2018-10-22 NOTE — Progress Notes (Signed)
Patient called this RN to speak with him on the back hallway about his behavior and whether he can be removed from secure back hallway and off 1:1 observation. Pt. Was educated that after speaking to the treatment team, the plan is to continue to monitor the patient's behavior until the morning, so that an appropriate amount of time has past with the patient being able to remain calm and not aggressive and posturing towards staff. Pt. Was very upset about hearing this plan from the treatment team and states, "I'm not going to be able to maintain this behavior, I'm going to flip out, it's only a matter of time". Pt. Given further education on the treatment planning as well as answering random questions the patient asks this writer that are not relevant to the conversation, so redirection is implemented by this Clinical research associate several times. Pt. Eventually verbalizes he will try to maintain appropriate behavior. The pt. During our conversation when needing to be redirected frequently becomes disorganized in his thought process, and begins expressing grandiose delusions and questions staff abilities.

## 2018-10-22 NOTE — Progress Notes (Signed)
Recreation Therapy Notes  Date: 10/22/2018  Time: 9:30 am   Location: Craft room   Behavioral response: N/A   Intervention Topic: Communication  Discussion/Intervention: Patient did not attend group.   Clinical Observations/Feedback:  Patient did not attend group.   Herbert Aguinaldo LRT/CTRS        Shallyn Constancio 10/22/2018 10:26 AM

## 2018-10-22 NOTE — Progress Notes (Signed)
[facility] MD Progress Note  10/22/2018 1:52 PM Jackson [name]  MRN:  [id number] Subjective: Patient seen chart reviewed.  This young man with schizoaffective disorder continues to be symptomatic.  He was extremely loud this morning and very disruptive on the unit.  Required one-to-one monitoring and being placed in the back hall.  He was pounding on the windows and shouting.  He is taking his medicine by mouth although he continues to complain about it.  He continues to insist that he has "a leaky valve" and needs to have an echocardiogram.  EKG reviewed.  It is normal.  He is not having any symptoms that would be typical of heart failure.  He tells me he is sure that his medications are causing problems for his heart because he has an aching soreness in his rib cage area that does not sound cardiac in origin. Principal Problem: Schizoaffective disorder, bipolar type (HCC) Diagnosis: Principal Problem:   Schizoaffective disorder, bipolar type (HCC) Active Problems:   Noncompliance  Total Time spent with patient: 30 minutes  Past Psychiatric History: Patient has a history of severe bipolar disorder with mania with extended hospitalizations.  Recently discontinued medicine.  Past Medical History:  Past Medical History:  Diagnosis Date  . Bipolar 1 disorder (HCC)   . Schizoaffective disorder (HCC)    History reviewed. No pertinent surgical history. Family History: History reviewed. No pertinent family history. Family Psychiatric  History: See previous Social History:  Social History   Substance and Sexual Activity  Alcohol Use Not on file     Social History   Substance and Sexual Activity  Drug Use Not on file    Social History   Socioeconomic History  . Marital status: Single    Spouse name: Not on file  . Number of children: Not on file  . Years of education: Not on file  . Highest education level: Not on file  Occupational History  . Not on file  Social Needs  . Financial  resource strain: Not on file  . Food insecurity:    Worry: Not on file    Inability: Not on file  . Transportation needs:    Medical: Not on file    Non-medical: Not on file  Tobacco Use  . Smoking status: Light Tobacco Smoker    Packs/day: 0.25    Types: Cigarettes  . Smokeless tobacco: Never Used  Substance and Sexual Activity  . Alcohol use: Not on file  . Drug use: Not on file  . Sexual activity: Not on file  Lifestyle  . Physical activity:    Days per week: Not on file    Minutes per session: Not on file  . Stress: Not on file  Relationships  . Social connections:    Talks on phone: Not on file    Gets together: Not on file    Attends religious service: Not on file    Active member of club or organization: Not on file    Attends meetings of clubs or organizations: Not on file    Relationship status: Not on file  Other Topics Concern  . Not on file  Social History Narrative  . Not on file   Additional Social History:                         Sleep: Fair  Appetite:  Fair  Current Medications: Current Facility-Administered Medications  Medication Dose Route Frequency Provider Last Rate Last Dose  .  acetaminophen (TYLENOL) tablet 650 mg  650 mg Oral Q6H PRN [name], MD      . alum & mag hydroxide-simeth (MAALOX/MYLANTA) 200-200-20 MG/5ML suspension 30 mL  30 mL Oral Q4H PRN [name], MD      . divalproex (DEPAKOTE) DR tablet 500 mg  500 mg Oral Q12H Denene Alamillo, [name], MD   500 mg at 10/22/18 0820  . hydrOXYzine (ATARAX/VISTARIL) tablet 50 mg  50 mg Oral Q4H PRN [name], MD      . LORazepam (ATIVAN) injection 2 mg  2 mg Intramuscular BID PRN Tashara Suder, [name], MD      . magnesium hydroxide (MILK OF MAGNESIA) suspension 30 mL  30 mL Oral Daily PRN [name], MD      . OLANZapine (ZYPREXA) injection 10 mg  10 mg Intramuscular Q6H PRN Kamill Fulbright T, MD      . OLANZapine (ZYPREXA) injection 15 mg  15 mg Intramuscular  BID PRN Chaney Ingram, [name], MD   15 mg at 10/20/18 1539  . OLANZapine zydis (ZYPREXA) disintegrating tablet 10 mg  10 mg Oral BID Enes Wegener, [name], MD   10 mg at 10/22/18 0820  . traZODone (DESYREL) tablet 100 mg  100 mg Oral QHS PRN [name], MD        Lab Results: No results found for this or any previous visit (from the past 48 hour(s)).  Blood Alcohol level:  Lab Results  Component Value Date   ETH <10 10/16/2018    Metabolic Disorder Labs: No results found for: HGBA1C, MPG No results found for: PROLACTIN No results found for: CHOL, TRIG, HDL, CHOLHDL, VLDL, LDLCALC  Physical Findings: AIMS: Facial and Oral Movements Muscles of Facial Expression: None, normal Lips and Perioral Area: None, normal Jaw: None, normal Tongue: None, normal,Extremity Movements Upper (arms, wrists, hands, fingers): None, normal Lower (legs, knees, ankles, toes): None, normal, Trunk Movements Neck, shoulders, hips: None, normal, Overall Severity Severity of abnormal movements (highest score from questions above): None, normal Incapacitation due to abnormal movements: None, normal Patient's awareness of abnormal movements (rate only patient's report): No Awareness, Dental Status Current problems with teeth and/or dentures?: No Does patient usually wear dentures?: No  CIWA:  CIWA-Ar Total: 0 COWS:  COWS Total Score: 0  Musculoskeletal: Strength & Muscle Tone: within normal limits Gait & Station: normal Patient leans: N/A  Psychiatric Specialty Exam: Physical Exam  Nursing note and vitals reviewed. Constitutional: He appears well-developed and well-nourished.  HENT:  Head: Normocephalic and atraumatic.  Eyes: Pupils are equal, round, and reactive to light. Conjunctivae are normal.  Neck: Normal range of motion.  Cardiovascular: Regular rhythm and normal heart sounds.  Respiratory: Effort normal. No respiratory distress.  GI: Soft.  Musculoskeletal: Normal range of motion.   Neurological: He is alert.  Skin: Skin is warm and dry.  Psychiatric: His affect is labile and inappropriate. His speech is rapid and/or pressured and tangential. He is agitated. Thought content is paranoid and delusional. Cognition and memory are impaired. He expresses inappropriate judgment.    Review of Systems  Constitutional: Negative.   HENT: Negative.   Eyes: Negative.   Respiratory: Negative.   Cardiovascular: Negative.   Gastrointestinal: Negative.   Musculoskeletal: Negative.   Skin: Negative.   Neurological: Negative.   Psychiatric/Behavioral: Negative for depression, hallucinations, memory loss, substance abuse and suicidal ideas. The patient is not nervous/anxious and does not have insomnia.     Blood pressure 95/75, pulse 62,  temperature 98 F (36.7 C), temperature source Oral, resp. rate 16, height 5\' 11"  (1.803 m), weight 88.5 kg, SpO2 100 %.Body mass index is 27.2 kg/m.  General Appearance: Casual  Eye Contact:  Fair  Speech:  Pressured  Volume:  Increased  Mood:  Angry, Anxious and Irritable  Affect:  Labile  Thought Process:  Disorganized  Orientation:  Full (Time, Place, and Person)  Thought Content:  Illogical, Paranoid Ideation and Rumination  Suicidal Thoughts:  No  Homicidal Thoughts:  No  Memory:  Immediate;   Fair Recent;   Poor Remote;   Fair  Judgement:  Impaired  Insight:  Shallow  Psychomotor Activity:  Increased  Concentration:  Concentration: Poor  Recall:  [employer] of Knowledge:  Fair  Language:  Fair  Akathisia:  No  Handed:  Right  AIMS (if indicated):     Assets:  Desire for Improvement Physical Health Resilience Social Support  ADL's:  Impaired  Cognition:  Impaired,  Mild  Sleep:  Number of Hours: 5.75     Treatment Plan Summary: Daily contact with patient to assess and evaluate symptoms and progress in treatment, Medication management and Plan Patient continues to show symptoms of mania with psychosis.  I tried to talk  with him about his complaints of the "leaky valve".  He will not give enough information to let us find any records of this.  Nevertheless in the meantime he is not having symptoms consistent with cardiac ischemia or with heart failure.  His EKG is normal.  I explained to him that his current medicines would not cause cardiac side effects in any case.  Fortunately he is now taking the Depakote and Zyprexa.  I hope that he will start to show some improvement.  We can check a Depakote level in another couple days if he remains compliant.  Staying on the back hall and having one-to-one monitoring is dependent on his behavior.  Mordecai Rasmussen, MD 10/22/2018, 1:52 PM

## 2018-10-22 NOTE — Progress Notes (Signed)
Patient given patient education about his scheduled medications that are due at and or around 1700. Patient verbalizes understanding of the medication education scheduled for this time and agrees to take his medications. Pt. Was also given the opportunity to ask questions about his medications again, but the patient's thought process is still a bit disorganized and tangential, and needs redirection often by this writer to stay focused. Pt. Continues to be fixated that he has a heart defect, but does not endorse pain with this Clinical research associate. Pt. Is pressured in his speech and dominates conversation. Pt. Apologizes for his behavior from this morning, regarding the patient yelling, screaming, and banging on hospital property. Pt. Continues to be religiously preoccupied, grandiose, and expressing other delusions. Pt. Asks when he can be released from the secure back hallway due to his aggressive behaviors and posturing towards staff and the patient was educated that this was up to the doctor and we will re-evaluate at a later time.

## 2018-10-22 NOTE — Progress Notes (Signed)
Patient is exhibiting psychotic behaviors that is disruptive to the safety of other residence,, patient is yelling to the top of  his lungs ,aggressive and threatening, kicking the wall with his legs and banging the walls and making loud noises, MD on call notiied and patient was placed on 1:1 for safety. 15 minutes safety checks maintained will continue to closely monitor. l

## 2018-10-22 NOTE — Progress Notes (Signed)
Call placed to house supervisor for staff to maintain doctor's orders for 1:1 observation and back hallway placement for safety due to aggression. This Clinical research associate was advised staffing is not available to staff 1:1 observation per MD orders, so unit will utilize available staff to maintain safety at this time, until staff is available to be sent. Will continue to monitor for safety.

## 2018-10-22 NOTE — Progress Notes (Signed)
D: Pt during assessments denies SI and contracts for safety stating he has no desire to harm himself, but is actively threatening and posturing towards staff (stating he will fight and harm staff if he is given forced medications per MD orders). Pt. Is visibly agitated and aggressive with staff this morning. Pt. Presents with fixated somatic complaints about having a heart condition, despite no records of this per treatment team discussion with this Clinical research associate and review of the chart. Pt. Is very argumentative with staff and hard to redirect. Pt. Raising his voice often when speaking with staff. Pt. Upon physical assessment is WDL. Pt. Denies physical pain, despite repeating over and over again that he has a heart condition.     A: 1:1 observation checks to be completed for safety per orders. Patient was provided with education, but is non-accepting of this and will need reinforcement.  Patient was given/offered medications per orders. Patient  was encourage to attend groups, participate in unit activities and continue with plan of care. Pt. Chart and plans of care reviewed. Pt. Given support and encouragement frequently.    R: Patient after strong direction, encouragement, and emotional support, agrees to take his morning medications. Pt. Was not happy about having to take his scheduled required medications, but did so. Pt. Is on 1:1 observations/Back hallway lock down at this time due to aggressive and posturing behaviors. Staff will continue to monitor per MD orders for safety. Pt. continues to refuse blood and urine testings per MD orders.

## 2018-10-22 NOTE — Progress Notes (Signed)
1:1 Observation Note  0800 Patient observed on the phone agitated and loud. Pt. Given staff direction at this time to not yell or be aggressive on the phone.  0900 Patient observed sitting in his room near the doorway talking with staff. Pt. Is still aggressive and argumentative, but to a lesser degree.  1000 Patient is observed resting in his room at this time with staff present for safety.  1100 Patient is observed resting in his room at this time with staff present for safety.  1200 Patient observed eating in his room at this time with staff present for safety.  1300 Patient is observed resting in his room at this time with staff present for safety.  1400 Patient observed sleeping in his room at this time with staff present for safety. No visible distress.  1500 Patient observed sleeping in his room at this time with staff present for safety. No visible distress.  1600 Patient observed sitting in his room doorway talking to staff present for safety. No distress noted.  1700 Patient observed dancing up and down the hallway singing to himself very hyperactive. Staff present for safety. No distress noted. 1800 Patient is observed resting in his room at this time with staff present for safety.  1900 Patient is observed resting in his room at this time with staff present for safety.

## 2018-10-22 NOTE — [facility] Group Notes (Signed)
Feelings Around Relapse 10/22/2018 1PM  Type of Therapy and Topic:  Group Therapy:  Feelings around Relapse and Recovery  Participation Level:  Did Not Attend   Description of Group:    Patients in this group will discuss emotions they experience before and after a relapse. They will process how experiencing these feelings, or avoidance of experiencing them, relates to having a relapse. Facilitator will guide patients to explore emotions they have related to recovery. Patients will be encouraged to process which emotions are more powerful. They will be guided to discuss the emotional reaction significant others in their lives may have to patients' relapse or recovery. Patients will be assisted in exploring ways to respond to the emotions of others without this contributing to a relapse.  Therapeutic Goals: 1. Patient will identify two or more emotions that lead to a relapse for them 2. Patient will identify two emotions that result when they relapse 3. Patient will identify two emotions related to recovery 4. Patient will demonstrate ability to communicate their needs through discussion and/or role plays   Summary of Patient Progress:     Therapeutic Modalities:   Cognitive Behavioral Therapy Solution-Focused Therapy Assertiveness Training Relapse Prevention Therapy   Suzan Slick, LCSW 10/22/2018 2:11 PM

## 2018-10-22 NOTE — Plan of Care (Signed)
Pt. Has been placed on 1:1 observation for safety due to aggressive and agitated behaviors. Pt. Safety has been able to be maintained. Staff will continue to monitor for safety. Pt. Continues to present tangential, aggressive, argumentative, and posturing towards staff, due to not wanting to take medications that the patient is required to take. Pt. Continues to present with grandiose and somatic delusions.    Problem: Education: Goal: Will be free of psychotic symptoms Outcome: Not Progressing   Problem: Safety: Goal: Periods of time without injury will increase Outcome: Progressing

## 2018-10-22 NOTE — Plan of Care (Signed)
  Problem: Education: Goal: Knowledge of [facility] Education information/materials will improve Outcome: Progressing   Problem: Coping: Goal: Ability to verbalize frustrations and anger appropriately will improve Outcome: Progressing Goal: Ability to demonstrate self-control will improve Outcome: Progressing   Problem: Safety: Goal: Periods of time without injury will increase Outcome: Progressing   Problem: Education: Goal: Will be free of psychotic symptoms Outcome: Progressing Goal: Knowledge of the prescribed therapeutic regimen will improve Outcome: Progressing

## 2018-10-22 NOTE — Progress Notes (Signed)
Patient affect is blunt with depressed mood,vity cognitively intact thought processes  Is logical , patient expresses impulsivity, patient contract for safety of self and others ,appetite is good well hydrated with fluid dng juid   denies any SI/HI/AVH at this time, patient refuse to attend group activities, education and encouragement is provided, encourage coping skills to improve emotional well been. 15 minutes safety checks is maintained no distress noted.

## 2018-10-23 ENCOUNTER — Inpatient Hospital Stay
Admission: AD | Admit: 2018-10-23 | Discharge: 2018-10-23 | Disposition: A | Discharge status: Home / Self Care | Payer: Medicaid Other | Attending: Psychiatry | Admitting: Psychiatry

## 2018-10-23 LAB — COMPREHENSIVE METABOLIC PANEL
ALT: 16 U/L (ref 0–44)
AST: 24 U/L (ref 15–41)
Albumin: 3.6 g/dL (ref 3.5–5.0)
Alkaline Phosphatase: 40 U/L (ref 38–126)
Anion gap: 9 (ref 5–15)
BUN: 16 mg/dL (ref 6–20)
CO2: 25 mmol/L (ref 22–32)
Calcium: 8.8 mg/dL — ABNORMAL LOW (ref 8.9–10.3)
Chloride: 105 mmol/L (ref 98–111)
Creatinine, Ser: 1.17 mg/dL (ref 0.61–1.24)
GFR calc Af Amer: >60 mL/min (ref >60)
GFR calc non Af Amer: >60 mL/min (ref >60)
Glucose, Bld: 85 mg/dL (ref 70–99)
POTASSIUM: 4.2 mmol/L (ref 3.5–5.1)
Sodium: 139 mmol/L (ref 135–145)
Total Bilirubin: 0.6 mg/dL (ref 0.3–1.2)
Total Protein: 6.4 g/dL — ABNORMAL LOW (ref 6.5–8.1)

## 2018-10-23 LAB — CBC
HCT: 43.1 % (ref 39.0–52.0)
Hemoglobin: 14 g/dL (ref 13.0–17.0)
MCH: 31.5 pg (ref 26.0–34.0)
MCHC: 32.5 g/dL (ref 30.0–36.0)
MCV: 97.1 fL (ref 80.0–100.0)
Platelets: 220 10*3/uL (ref 150–400)
RBC: 4.44 MIL/uL (ref 4.22–5.81)
RDW: 12.5 % (ref 11.5–15.5)
WBC: 3.3 10*3/uL — ABNORMAL LOW (ref 4.0–10.5)
nRBC: 0 % (ref 0.0–0.2)

## 2018-10-23 LAB — URINALYSIS, ROUTINE W REFLEX MICROSCOPIC
BILIRUBIN URINE: NEGATIVE
Bacteria, UA: NONE SEEN
Glucose, UA: NEGATIVE mg/dL
KETONES UR: NEGATIVE mg/dL
Leukocytes,Ua: NEGATIVE
NITRITE: NEGATIVE
Protein, ur: NEGATIVE mg/dL
Specific Gravity, Urine: 1.005 (ref 1.005–1.030)
Squamous Epithelial / HPF: NONE SEEN (ref 0–5)
pH: 6 (ref 5.0–8.0)

## 2018-10-23 LAB — LIPID PANEL
Cholesterol: 129 mg/dL (ref 0–200)
HDL: 50 mg/dL (ref >40)
LDL CALC: 67 mg/dL (ref 0–99)
Total CHOL/HDL Ratio: 2.6 RATIO
Triglycerides: 58 mg/dL (ref <150)
VLDL: 12 mg/dL (ref 0–40)

## 2018-10-23 LAB — TSH: TSH: 1.585 u[IU]/mL (ref 0.350–4.500)

## 2018-10-23 LAB — ECHOCARDIOGRAM COMPLETE
Height: 71 in
Weight: 3120 oz

## 2018-10-23 LAB — HEMOGLOBIN A1C
Hgb A1c MFr Bld: 5.2 % (ref 4.8–5.6)
MEAN PLASMA GLUCOSE: 102.54 mg/dL

## 2018-10-23 NOTE — Progress Notes (Signed)
*  PRELIMINARY RESULTS* Echocardiogram 2D Echocardiogram has been performed.  Garrel Ridgel Magdalen Cabana 10/23/2018, 11:07 AM

## 2018-10-23 NOTE — Progress Notes (Signed)
Patient seen by Cardiology

## 2018-10-23 NOTE — Plan of Care (Signed)
Pt. Self control is shown to be improving this morning as evidence by patient taking his scheduled morning medications with much less non-compliance. Pt. Has been monitored for safety and been able to remain safe. Pt. Will continue to be monitored for safety. Pt. Continues to present with several delusional and somatic ideations, but behavior is less aggressive overall and is not posturing towards staff today.    Problem: Coping: Goal: Ability to demonstrate self-control will improve Outcome: Progressing   Problem: Safety: Goal: Periods of time without injury will increase Outcome: Progressing   Problem: Education: Goal: Will be free of psychotic symptoms Outcome: Progressing

## 2018-10-23 NOTE — Progress Notes (Signed)
1:1 Observation Note  0700 Patient observed on back hallway resting in his room with staff present for safety.  0800 Patient observed on back hallway resting in his room with staff present for safety.  0900 Patient observed on back hallway resting in his room with staff present for safety.  1000 Patient observed on back hallway resting in his room with staff present for safety.    1:1 Observation Discontinued, but will monitor patient behavior closely still for safety.

## 2018-10-23 NOTE — Progress Notes (Signed)
D: Pt during assessments denies SI/HI/AVH, contracts for safety. Pt. States he will not act out or posture towards staff if allowed to join the rest of the unit. Pt. Denies anxiety and or depression. Pt. Denies pain, but still fixated on having a heart condition. MD orders for additional testing explained to the patient and orders will be followed. Pt. Still presenting with periods of disorganized thought process, but circumstantial. Pt. Continues to express grandiose statements.    A: Q x 15 minute observation checks to be completed for safety. Patient was provided with education, but continues to be non-accepting of information provided. Patient was given/offered medications per orders. Patient  was encourage to attend groups, participate in unit activities and continue with plan of care. Pt. Chart and plans of care reviewed. Pt. Given support and encouragement.    R: Patient is more complaint with medications and unit procedures this morning with direction and encouragement. Pt. Eating good, eats his whole breakfast. Pt. Continues to request several different exams or screenings to be completed for his health. Pt. Asked staff today about having a bone marrow biopsy to be completed on him to check his health as well as to check radiation levels for his safety.

## 2018-10-23 NOTE — Progress Notes (Signed)
Spoke to the echocardiogram staff member "Tye" regarding the patient's ordered routine echocardiogram. This writer was advised that the echocardiogram staffing will get to the patient hopefully sometime this evening, due to other patients imaging ordered before this patient's. Spoke to ordering MD to inform her of this scheduling relayed to this writer from echocardiogram team.

## 2018-10-23 NOTE — [facility] Group Notes (Signed)
LCSW Group Therapy Note   10/23/2018 1:15pm   Type of Therapy and Topic:  Group Therapy:  Trust and Honesty  Participation Level:  Did Not Attend  Description of Group:    In this group patients will be asked to explore the value of being honest.  Patients will be guided to discuss their thoughts, feelings, and behaviors related to honesty and trusting in others. Patients will process together how trust and honesty relate to forming relationships with peers, family members, and self. Each patient will be challenged to identify and express feelings of being vulnerable. Patients will discuss reasons why people are dishonest and identify alternative outcomes if one was truthful (to self or others). This group will be process-oriented, with patients participating in exploration of their own experiences, giving and receiving support, and processing challenge from other group members.   Therapeutic Goals: 1. Patient will identify why honesty is important to relationships and how honesty overall affects relationships.  2. Patient will identify a situation where they lied or were lied too and the  feelings, thought process, and behaviors surrounding the situation 3. Patient will identify the meaning of being vulnerable, how that feels, and how that correlates to being honest with self and others. 4. Patient will identify situations where they could have told the truth, but instead lied and explain reasons of dishonesty.   Summary of Patient Progress Pt was invited to attend group but chose not to attend. CSW will continue to encourage pt to attend group throughout their admission.    Therapeutic Modalities:   Cognitive Behavioral Therapy Solution Focused Therapy Motivational Interviewing Brief Therapy  Lola Lofaro  [name], LCSW 10/23/2018 12:37 PM  

## 2018-10-23 NOTE — Progress Notes (Signed)
Spoke to the treatment team this morning regarding the patient's current presentation and improving behavior. Pt. This morning was much more complaint with taking his medications and is showing improvement in his behavior. Pt. Is improved, showing less aggression towards staff, no posturing towards staff currently, and is not yelling or screaming. Will remove patient off 1:1 per MD, but continue to monitor closely for changes in behavior or increased aggression or posturing for safety.

## 2018-10-23 NOTE — Progress Notes (Signed)
[facility] MD Progress Note  10/23/2018 2:46 PM Jackson [name]  MRN:  [id number] Subjective:  Patient seen.  Chart reviewed. Patient discussed with nursing; no overnight events reported. Patient was compliant with oral medications this AM.  Jackson [name] is a 33yo AAM with psychiatric diagnosis of schizoaffective disorder, who was admitted 5 days ago to inpatient psychiatry for medication management and stabilization secondary to psychosis and mania.    Patient spent his night being on 1:1 observation. He was calm, did not show any behavioral issues and did not raise safety concerns.   Today patient appears calm and seen in his room. He is cooperative with the interview. He reports feeing "fine". He reports feeling anxious due to his health. He is fixated on idea that he has a "valve leak syndrome" and requests cardiology consult. He appears agitated when talking about his health issues. 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 He is asking about "bone marrow exam". We discussed that bone marrow aspiration is a severe invasive procedure and cannot be done without strict indications, but that he will undergo an echo-cardiorgaphy followed by a cardiologist consult today. Patient expressed understanding and agreement with the plan. He denies feeling depressed. Denies suicidal and homicidal thoughts or plans. Denies any hallucinations. He reports regular sleep and appetite. He denies any side effects from medications, although is paranoid that medications will worsen his "heart condition".  Echocardiography was performed; result: WNL, no patologic findings. Patient seen by a cardilolgist, Dr. Juliann Pares - no special recommendations, can continue current psych medications.   Principal Problem: Schizoaffective disorder, bipolar type (HCC) Diagnosis: Principal Problem:   Schizoaffective disorder, bipolar type (HCC) Active Problems:    Noncompliance  Total Time spent with patient: 30 minutes  Past Psychiatric History: See initial admission H&P  Past Medical History:  Past Medical History:  Diagnosis Date  . Bipolar 1 disorder (HCC)   . Schizoaffective disorder (HCC)    History reviewed. No pertinent surgical history. Family History: History reviewed. No pertinent family history. Family Psychiatric  History: See admission H&P Social History:  Social History   Substance and Sexual Activity  Alcohol Use Not on file     Social History   Substance and Sexual Activity  Drug Use Not on file    Social History   Socioeconomic History  . Marital status: Single    Spouse name: Not on file  . Number of children: Not on file  . Years of education: Not on file  . Highest education level: Not on file  Occupational History  . Not on file  Social Needs  . Financial resource strain: Not on file  . Food insecurity:    Worry: Not on file    Inability: Not on file  . Transportation needs:    Medical: Not on file    Non-medical: Not on file  Tobacco Use  . Smoking status: Light Tobacco Smoker    Packs/day: 0.25    Types: Cigarettes  . Smokeless tobacco: Never Used  Substance and Sexual Activity  . Alcohol use: Not on file  . Drug use: Not on file  . Sexual activity: Not on file  Lifestyle  . Physical activity:    Days per week: Not on file    Minutes per session: Not on file  . Stress: Not on file  Relationships  . Social connections:    Talks on phone: Not on file  Gets together: Not on file    Attends religious service: Not on file    Active member of club or organization: Not on file    Attends meetings of clubs or organizations: Not on file    Relationship status: Not on file  Other Topics Concern  . Not on file  Social History Narrative  . Not on file   Additional Social History: See initial admission H&P                        Sleep: Good  Appetite:  Good  Current  Medications: Current Facility-Administered Medications  Medication Dose Route Frequency Provider Last Rate Last Dose  . acetaminophen (TYLENOL) tablet 650 mg  650 mg Oral Q6H PRN [name], MD      . alum & mag hydroxide-simeth (MAALOX/MYLANTA) 200-200-20 MG/5ML suspension 30 mL  30 mL Oral Q4H PRN [name], MD      . divalproex (DEPAKOTE) DR tablet 500 mg  500 mg Oral Q12H [name], [name], MD   500 mg at 10/23/18 0757  . hydrOXYzine (ATARAX/VISTARIL) tablet 50 mg  50 mg Oral Q4H PRN [name], MD      . LORazepam (ATIVAN) injection 2 mg  2 mg Intramuscular BID PRN [name], [name], MD      . magnesium hydroxide (MILK OF MAGNESIA) suspension 30 mL  30 mL Oral Daily PRN [name], MD      . OLANZapine (ZYPREXA) injection 10 mg  10 mg Intramuscular Q6H PRN [name], [name], MD      . OLANZapine (ZYPREXA) injection 15 mg  15 mg Intramuscular BID PRN [name], [name], MD   15 mg at 10/20/18 1539  . OLANZapine zydis (ZYPREXA) disintegrating tablet 10 mg  10 mg Oral BID [name], [name], MD   10 mg at 10/23/18 0757  . traZODone (DESYREL) tablet 100 mg  100 mg Oral QHS PRN [name], MD        Lab Results:  Results for orders placed or performed during the hospital encounter of 10/17/18 (from the past 48 hour(s))  CBC     Status: Abnormal   Collection Time: 10/23/18  6:55 AM  Result Value Ref Range   WBC 3.3 (L) 4.0 - 10.5 K/uL   RBC 4.44 4.22 - 5.81 MIL/uL   Hemoglobin 14.0 13.0 - 17.0 g/dL   HCT 16.1 09.6 - 04.5 %   MCV 97.1 80.0 - 100.0 fL   MCH 31.5 26.0 - 34.0 pg   MCHC 32.5 30.0 - 36.0 g/dL   RDW 40.9 81.1 - 91.4 %   Platelets 220 150 - 400 K/uL   nRBC 0.0 0.0 - 0.2 %    Comment: Performed at [facility], [address] [place], [place] [address]  Comprehensive metabolic panel     Status: Abnormal   Collection Time: 10/23/18  6:55 AM  Result Value Ref Range   Sodium 139 135 - 145 mmol/L   Potassium 4.2 3.5 - 5.1 mmol/L    Chloride 105 98 - 111 mmol/L   CO2 25 22 - 32 mmol/L   Glucose, Bld 85 70 - 99 mg/dL   BUN 16 6 - 20 mg/dL   Creatinine, Ser 6.21 0.61 - 1.24 mg/dL   Calcium 8.8 (L) 8.9 - 10.3 mg/dL   Total Protein 6.4 (L) 6.5 - 8.1 g/dL   Albumin 3.6 3.5 - 5.0 g/dL   AST  24 15 - 41 U/L   ALT 16 0 - 44 U/L   Alkaline Phosphatase 40 38 - 126 U/L   Total Bilirubin 0.6 0.3 - 1.2 mg/dL   GFR calc non Af Amer >60 >60 mL/min   GFR calc Af Amer >60 >60 mL/min   Anion gap 9 5 - 15    Comment: Performed at [facility], [address] [place], [place] [address]  Hemoglobin A1c     Status: None   Collection Time: 10/23/18  6:55 AM  Result Value Ref Range   Hgb A1c MFr Bld 5.2 4.8 - 5.6 %    Comment: (NOTE) Pre diabetes:          5.7%-6.4% Diabetes:              >6.4% Glycemic control for   <7.0% adults with diabetes    Mean Plasma Glucose 102.54 mg/dL    Comment: Performed at [facility], [address], [place], [place] [address]  Lipid panel     Status: None   Collection Time: 10/23/18  6:55 AM  Result Value Ref Range   Cholesterol 129 0 - 200 mg/dL   Triglycerides 58 <470 mg/dL   HDL 50 >96 mg/dL   Total CHOL/HDL Ratio 2.6 RATIO   VLDL 12 0 - 40 mg/dL   LDL Cholesterol 67 0 - 99 mg/dL    Comment:        Total Cholesterol/HDL:CHD Risk Coronary Heart Disease Risk Table                     Men   Women  1/2 Average Risk   3.4   3.3  Average Risk       5.0   4.4  2 X Average Risk   9.6   7.1  3 X Average Risk  23.4   11.0        Use the calculated Patient Ratio above and the CHD Risk Table to determine the patient's CHD Risk.        ATP III CLASSIFICATION (LDL):  <100     mg/dL   Optimal  283-662  mg/dL   Near or Above                    Optimal  130-159  mg/dL   Borderline  947-654  mg/dL   High  >650     mg/dL   Very High Performed at [facility], [address] [place], [place] [address]   TSH     Status: None   Collection Time: 10/23/18  6:55 AM   Result Value Ref Range   TSH 1.585 0.350 - 4.500 uIU/mL    Comment: Performed by a 3rd Generation assay with a functional sensitivity of <=0.01 uIU/mL. Performed at [facility], [address] [place], [place] [address]   Urinalysis, Routine w reflex microscopic     Status: Abnormal   Collection Time: 10/23/18 10:04 AM  Result Value Ref Range   Color, Urine STRAW (A) YELLOW   APPearance CLEAR (A) CLEAR   Specific Gravity, Urine 1.005 1.005 - 1.030   pH 6.0 5.0 - 8.0   Glucose, UA NEGATIVE NEGATIVE mg/dL   Hgb urine dipstick SMALL (A) NEGATIVE   Bilirubin Urine NEGATIVE NEGATIVE   Ketones, ur NEGATIVE NEGATIVE mg/dL   Protein, ur NEGATIVE NEGATIVE mg/dL   Nitrite NEGATIVE NEGATIVE   Leukocytes,Ua NEGATIVE NEGATIVE   RBC /  HPF 0-5 0 - 5 RBC/hpf   WBC, UA 0-5 0 - 5 WBC/hpf   Bacteria, UA NONE SEEN NONE SEEN   Squamous Epithelial / LPF NONE SEEN 0 - 5    Comment: Performed at [facility], [address] [place], [place] [id number]    Blood Alcohol level:  Lab Results  Component Value Date   [facility]2018    Metabolic Disorder Labs: Lab Results  Component Value Date   HGBA1C 5.2 10/23/2018   MPG 102.54 10/23/2018   No results found for: PROLACTIN Lab Results  Component Value Date   CHOL 129 10/23/2018   TRIG 58 10/23/2018   HDL 50 10/23/2018   CHOLHDL 2.6 10/23/2018   VLDL 12 10/23/2018   LDLCALC 67 10/23/2018    Physical Findings: AIMS: Facial and Oral Movements Muscles of Facial Expression: None, normal Lips and Perioral Area: None, normal Jaw: None, normal Tongue: None, normal,Extremity Movements Upper (arms, wrists, hands, fingers): None, normal Lower (legs, knees, ankles, toes): None, normal, Trunk Movements Neck, shoulders, hips: None, normal, Overall Severity Severity of abnormal movements (highest score from questions above): None, normal Incapacitation due to abnormal movements: None, normal Patient's awareness of abnormal  movements (rate only patient's report): No Awareness, Dental Status Current problems with teeth and/or dentures?: No Does patient usually wear dentures?: No  CIWA:  CIWA-Ar Total: 0 COWS:  COWS Total Score: 0  Musculoskeletal: Strength & Muscle Tone: within normal limits Gait & Station: normal Patient leans: N/A  Psychiatric Specialty Exam: Physical Exam  ROS  Blood pressure 102/68, pulse 67, temperature 97.9 F (36.6 C), temperature source Oral, resp. rate 16, height 5\' 11"  (1.803 m), weight 88.5 kg, SpO2 100 %.Body mass index is 27.2 kg/m.  General Appearance: Casual  Eye Contact:  Fair  Speech:  Pressured  Volume:  Normal  Mood:  Anxious  Affect:  Inappropriate  Thought Process:  Coherent  Orientation:  Full (Time, Place, and Person)  Thought Content:  Illogical, Delusions, Obsessions, Paranoid Ideation and Rumination  Suicidal Thoughts:  No  Homicidal Thoughts:  No  Memory:  NA  Judgement:  Poor  Insight:  Lacking  Psychomotor Activity:  Normal  Concentration:  Concentration: Fair  Recall:  [employer]  Fund of Knowledge:  Fair  Language:  Fair  Akathisia:  No  Handed:  Right  AIMS (if indicated):     Assets:  Desire for Improvement Physical Health Resilience Social Support  ADL's:  Intact  Cognition:  Impaired,  Mild  Sleep:  Number of Hours: 7     Treatment Plan Summary: Daily contact with patient to assess and evaluate symptoms and progress in treatment   Mr. Boettner is a 33yo AAM with psychiatric diagnosis of schizoaffective disorder, who was admitted 5 days ago to inpatient psychiatry for medication management and stabilization secondary to psychosis and mania.    Patient spent his night being on 1:1 observation. He was calm, did not show any behavioral issues and did not raise safety concerns.  Patient continues to express delusional thinking and appears manic, although less agitated compare to previous day.  Echocardiography was performed; result: WNL, no  patologic findings. Patient seen by a cardilolgist, Dr. Juliann Paresallwood - no special recommendations, can continue current psych medications.   Impression:  Schizoaffective disorder, biopolar type.  Plan: -continue inpatient psych admission;  -will take the patient off 1:1 observation; will continue 15-minute checks  -daily contact with patient to assess and evaluate symptoms and progress in treatment.  -  continue Zyprexa  PO BID for psychosis;  -continue Trazodone  PO QHS PRN sleep;  -continue PRN medications for agitation: Zyprexa 10-15mg  IM BID and Ativan  IM BID.  -continue Hydroxyzine  PO Q4h PRN anxiety;  -Echocardiography and cardiology consult were performed today - no special recs.  -psychoeducation;  -encouragement to be involved in the milieu;  -Disposition: to be determined    Thalia Party, MD 10/23/2018, 2:46 PM

## 2018-10-23 NOTE — Plan of Care (Signed)
Jackson [name] remains on 1:1 for aggressive behavior and remains on the back hall with staff. Upon initial approach by Clinical research associate, patient denied having to take medicine on shift.  He needed a lot of encouragement to take night medicine and got upset with Clinical research associate after medication regime was explained to him multiple times and that medication was ordered for him on shift. After twenty minutes and asking him to write down his medicine to help him remember he finally took his medication but he began to yell in the hallway about being overly medicated. It took security and nursing staff to get him back to the back hall and to calm down. The charge nurse stayed with the patient on the back hall to assist him to calm down but he started making racist rude remarks to the charge nurse about her being from [place]. He finally calmed down after about an hour and is now in bed resting at this time.
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
## 2018-10-23 NOTE — Progress Notes (Signed)
Seeing patient in his room not sleeping and patient is appropriate and irritabl upon approach , feeling depressed and disappointed to be told that no hole in his heart   from the eco-cardiogram result., his , patient is not attending groups and not requesting any PRNs but med compliant ,  affect is inconsistent with thought content . Patient denies suicidal, homicidal ideations. Denies hallucinations and delusions , medication is taken, no violent behaviors in the unit ant yelling to staff, voice no complains, patient is safe in the unit and 15 munites safety rounding is maintained no distress noted.

## 2018-10-24 MED ORDER — ARIPIPRAZOLE 5 MG PO TABS
5.0000 mg | ORAL_TABLET | Freq: Once | ORAL | Status: AC
  Administered 2018-10-24: 5 mg via ORAL
  Filled 2018-10-24: qty 1

## 2018-10-24 NOTE — Progress Notes (Signed)
Patient refused morning medication stating that "they're too heavy, it feels like y'all have me on street drugs, those aren't narcotics". Patient also states that "I'm going to need something for withdrawals, I'm shaky". This Clinical research associate notified MD.

## 2018-10-24 NOTE — Progress Notes (Signed)
[facility] MD Progress Note  10/24/2018 10:01 AM Jackson [name]  MRN:  [id number] Subjective:  Patient seen.  Chart reviewed. Patient discussed with nursing; no unsafe overnight events reported. Patient refused oral medications this AM.  Jackson [name] is a 33yo AAM with psychiatric diagnosis of schizoaffective disorder, who was admitted 6 days ago to inpatient psychiatry for medication management and stabilization secondary to psychosis and mania.    Today patient stays in his room mostly, appears slightly agitated during the interview. He denies any current mental complaints - denies feeling depressed, anxious, suicidal, homicidal, denies any hallucinations. He is still delusional that current psych medications cause his body ache and can harm him. He reports feeling sedated after current medications (Zyprexa and Depakote); he reports he prefers Abilify to other medications. He accepts to take one dose of oral Abilify. We discussed results of echocardiography and cardilogist consult, patient expressed understanding that no heart abnormalities were found and that he has no cardiac contraindications to psychotropic medications. Patient shows poor insight and states "there was nothing wrong with me. I should not be here"; he is paranoid that they were plotting against him at a group home. He again agreed to take one-time 5mg  dose of Abilify "to calm down".    Principal Problem: Schizoaffective disorder, bipolar type (HCC) Diagnosis: Principal Problem:   Schizoaffective disorder, bipolar type (HCC) Active Problems:   Noncompliance  Total Time spent with patient: 20 minutes  Past Psychiatric History: see admission H&P  Past Medical History:  Past Medical History:  Diagnosis Date  . Bipolar 1 disorder (HCC)   . Schizoaffective disorder (HCC)    History reviewed. No pertinent surgical history. Family History: History reviewed. No pertinent family history. Family Psychiatric  History: see admission  H&P Social History:  Social History   Substance and Sexual Activity  Alcohol Use Not on file     Social History   Substance and Sexual Activity  Drug Use Not on file    Social History   Socioeconomic History  . Marital status: Single    Spouse name: Not on file  . Number of children: Not on file  . Years of education: Not on file  . Highest education level: Not on file  Occupational History  . Not on file  Social Needs  . Financial resource strain: Not on file  . Food insecurity:    Worry: Not on file    Inability: Not on file  . Transportation needs:    Medical: Not on file    Non-medical: Not on file  Tobacco Use  . Smoking status: Light Tobacco Smoker    Packs/day: 0.25    Types: Cigarettes  . Smokeless tobacco: Never Used  Substance and Sexual Activity  . Alcohol use: Not on file  . Drug use: Not on file  . Sexual activity: Not on file  Lifestyle  . Physical activity:    Days per week: Not on file    Minutes per session: Not on file  . Stress: Not on file  Relationships  . Social connections:    Talks on phone: Not on file    Gets together: Not on file    Attends religious service: Not on file    Active member of club or organization: Not on file    Attends meetings of clubs or organizations: Not on file    Relationship status: Not on file  Other Topics Concern  . Not on file  Social History Narrative  . Not on  file   Additional Social History:       see admission H&P                    Sleep: Fair  Appetite:  Fair  Current Medications: Current Facility-Administered Medications  Medication Dose Route Frequency Provider Last Rate Last Dose  . acetaminophen (TYLENOL) tablet 650 mg  650 mg Oral Q6H PRN [name], MD      . alum & mag hydroxide-simeth (MAALOX/MYLANTA) 200-200-20 MG/5ML suspension 30 mL  30 mL Oral Q4H PRN [name], MD      . ARIPiprazole (ABILIFY) tablet 5 mg  5 mg Oral Once Thalia Party, MD      .  divalproex (DEPAKOTE) DR tablet 500 mg  500 mg Oral Q12H [name], [name], MD   500 mg at 10/23/18 2055  . hydrOXYzine (ATARAX/VISTARIL) tablet 50 mg  50 mg Oral Q4H PRN [name], MD      . LORazepam (ATIVAN) injection 2 mg  2 mg Intramuscular BID PRN [name], [name], MD      . magnesium hydroxide (MILK OF MAGNESIA) suspension 30 mL  30 mL Oral Daily PRN [name], MD      . OLANZapine (ZYPREXA) injection 10 mg  10 mg Intramuscular Q6H PRN [name], [name], MD      . OLANZapine (ZYPREXA) injection 15 mg  15 mg Intramuscular BID PRN [name], [name], MD   15 mg at 10/20/18 1539  . OLANZapine zydis (ZYPREXA) disintegrating tablet 10 mg  10 mg Oral BID [name], [name], MD   10 mg at 10/23/18 1641  . traZODone (DESYREL) tablet 100 mg  100 mg Oral QHS PRN [name], MD        Lab Results:  Results for orders placed or performed during the hospital encounter of 10/17/18 (from the past 48 hour(s))  CBC     Status: Abnormal   Collection Time: 10/23/18  6:55 AM  Result Value Ref Range   WBC 3.3 (L) 4.0 - 10.5 K/uL   RBC 4.44 4.22 - 5.81 MIL/uL   Hemoglobin 14.0 13.0 - 17.0 g/dL   HCT 28.3 15.1 - 76.1 %   MCV 97.1 80.0 - 100.0 fL   MCH 31.5 26.0 - 34.0 pg   MCHC 32.5 30.0 - 36.0 g/dL   RDW 60.7 37.1 - 06.2 %   Platelets 220 150 - 400 K/uL   nRBC 0.0 0.0 - 0.2 %    Comment: Performed at [facility], [address] [place], [place] [address]  Comprehensive metabolic panel     Status: Abnormal   Collection Time: 10/23/18  6:55 AM  Result Value Ref Range   Sodium 139 135 - 145 mmol/L   Potassium 4.2 3.5 - 5.1 mmol/L   Chloride 105 98 - 111 mmol/L   CO2 25 22 - 32 mmol/L   Glucose, Bld 85 70 - 99 mg/dL   BUN 16 6 - 20 mg/dL   Creatinine, Ser 4.62 0.61 - 1.24 mg/dL   Calcium 8.8 (L) 8.9 - 10.3 mg/dL   Total Protein 6.4 (L) 6.5 - 8.1 g/dL   Albumin 3.6 3.5 - 5.0 g/dL   AST 24 15 - 41 U/L   ALT 16 0 - 44 U/L   Alkaline Phosphatase 40 38 - 126 U/L    Total Bilirubin 0.6 0.3 - 1.2 mg/dL   GFR calc non Af Amer >60 >60 mL/min  GFR calc Af Amer >60 >60 mL/min   Anion gap 9 5 - 15    Comment: Performed at [facility], [address] [place], [place] [address]  Hemoglobin A1c     Status: None   Collection Time: 10/23/18  6:55 AM  Result Value Ref Range   Hgb A1c MFr Bld 5.2 4.8 - 5.6 %    Comment: (NOTE) Pre diabetes:          5.7%-6.4% Diabetes:              >6.4% Glycemic control for   <7.0% adults with diabetes    Mean Plasma Glucose 102.54 mg/dL    Comment: Performed at [facility], [address] [place], [place] [address]  Lipid panel     Status: None   Collection Time: 10/23/18  6:55 AM  Result Value Ref Range   Cholesterol 129 0 - 200 mg/dL   Triglycerides 58 <098 mg/dL   HDL 50 >11 mg/dL   Total CHOL/HDL Ratio 2.6 RATIO   VLDL 12 0 - 40 mg/dL   LDL Cholesterol 67 0 - 99 mg/dL    Comment:        Total Cholesterol/HDL:CHD Risk Coronary Heart Disease Risk Table                     Men   Women  1/2 Average Risk   3.4   3.3  Average Risk       5.0   4.4  2 X Average Risk   9.6   7.1  3 X Average Risk  23.4   11.0        Use the calculated Patient Ratio above and the CHD Risk Table to determine the patient's CHD Risk.        ATP III CLASSIFICATION (LDL):  <100     mg/dL   Optimal  914-782  mg/dL   Near or Above                    Optimal  130-159  mg/dL   Borderline  956-213  mg/dL   High  >086     mg/dL   Very High Performed at [facility], [address] [place], [place] [address]   TSH     Status: None   Collection Time: 10/23/18  6:55 AM  Result Value Ref Range   TSH 1.585 0.350 - 4.500 uIU/mL    Comment: Performed by a 3rd Generation assay with a functional sensitivity of <=0.01 uIU/mL. Performed at [facility], [address] [place], [place] [address]   Urinalysis, Routine w reflex microscopic     Status: Abnormal   Collection Time: 10/23/18 10:04 AM  Result  Value Ref Range   Color, Urine STRAW (A) YELLOW   APPearance CLEAR (A) CLEAR   Specific Gravity, Urine 1.005 1.005 - 1.030   pH 6.0 5.0 - 8.0   Glucose, UA NEGATIVE NEGATIVE mg/dL   Hgb urine dipstick SMALL (A) NEGATIVE   Bilirubin Urine NEGATIVE NEGATIVE   Ketones, ur NEGATIVE NEGATIVE mg/dL   Protein, ur NEGATIVE NEGATIVE mg/dL   Nitrite NEGATIVE NEGATIVE   Leukocytes,Ua NEGATIVE NEGATIVE   RBC / HPF 0-5 0 - 5 RBC/hpf   WBC, UA 0-5 0 - 5 WBC/hpf   Bacteria, UA NONE SEEN NONE SEEN   Squamous Epithelial / LPF NONE SEEN 0 - 5    Comment: Performed at [facility],  [address], [place], [place] [address]    Blood Alcohol level:  Lab Results  Component Value Date   ETH <10 10/16/2018    Metabolic Disorder Labs: Lab Results  Component Value Date   HGBA1C 5.2 10/23/2018   MPG 102.54 10/23/2018   No results found for: PROLACTIN Lab Results  Component Value Date   CHOL 129 10/23/2018   TRIG 58 10/23/2018   HDL 50 10/23/2018   CHOLHDL 2.6 10/23/2018   VLDL 12 10/23/2018   LDLCALC 67 10/23/2018    Physical Findings: AIMS: Facial and Oral Movements Muscles of Facial Expression: None, normal Lips and Perioral Area: None, normal Jaw: None, normal Tongue: None, normal,Extremity Movements Upper (arms, wrists, hands, fingers): None, normal Lower (legs, knees, ankles, toes): None, normal, Trunk Movements Neck, shoulders, hips: None, normal, Overall Severity Severity of abnormal movements (highest score from questions above): None, normal Incapacitation due to abnormal movements: None, normal Patient's awareness of abnormal movements (rate only patient's report): No Awareness, Dental Status Current problems with teeth and/or dentures?: No Does patient usually wear dentures?: No  CIWA:  CIWA-Ar Total: 0 COWS:  COWS Total Score: 0  Musculoskeletal: Strength & Muscle Tone: within normal limits Gait & Station: normal Patient leans: N/A  Psychiatric Specialty  Exam: Physical Exam  ROS  Blood pressure 98/72, pulse 62, temperature 97.9 F (36.6 C), temperature source Oral, resp. rate 18, height  (1.803 m), weight 88.5 kg, SpO2 91 %.Body mass index is 27.2 kg/m.  General Appearance: Guarded  Eye Contact:  Minimal  Speech:  Pressured  Volume:  Normal  Mood:  Irritable  Affect:  Labile  Thought Process:  Disorganized  Orientation:  Full (Time, Place, and Person)  Thought Content:  Delusions  Suicidal Thoughts:  No  Homicidal Thoughts:  No  Memory:  Immediate;   Fair Recent;   Fair Remote;   Fair  Judgement:  Impaired  Insight:  Lacking  Psychomotor Activity:  Restlessness  Concentration:  Concentration: Poor and Attention Span: Poor  Recall:  [employer] of Knowledge:  Fair  Language:  Good  Akathisia:  No  Handed:  Right  AIMS (if indicated):     Assets:  Physical Health  ADL's:  Intact  Cognition:  Impaired,  Moderate  Sleep:  Number of Hours: 6.45     Treatment Plan Summary:   Mr. Friesz is a 33yo AAM with psychiatric diagnosis of schizoaffective disorder, who was admitted 6 days ago to inpatient psychiatry for medication management and stabilization secondary to psychosis and mania.    Patient continues to appear paranoid, shows poor judjememt and does not have insight in his mental health condition. Patient has an order for forced psych meds. He refuses scheduled psych medications, although agreed to take one-time dose of oral Abilify as he believes this is the only medication he can tolerate. No current suicidal or homicidal thoughts. Will put one-time dose of Abilify today, will continue all other psych medications for now; will defer a further medication management to a treatment team tomorrow.  Impression:  Schizoaffective disorder, bipolar type.  Plan: -continue inpatient psych admission;  -continue 15-minute checks  -daily contact with patient to assess and evaluate symptoms and progress in  treatment.  -Abilify  PO one time.  -continue Zyprexa  PO BID for psychosis;  -continue Trazodone  PO QHS PRN sleep;  -continue PRN medications for agitation: Zyprexa 10-15mg  IM BID and Ativan  IM BID.  -continue Hydroxyzine  PO Q4h PRN anxiety;  -  Echocardiography and cardiology consult were performed yesterday - no special recs.  -psychoeducation;  -encouragement to be involved in the milieu;  -Disposition: to be determined   Thalia PartyAlisa Shaye Elling, MD 10/24/2018, 10:01 AM

## 2018-10-24 NOTE — Progress Notes (Signed)
Patient took his one-time prescribed Abilify this morning because "I used to take Abilify". Patient has been present in the milieu, attending groups, and has interacted well with staff and other members on the unit without any issues thus far.

## 2018-10-24 NOTE — Plan of Care (Signed)
In bed resting quietly no distress.

## 2018-10-24 NOTE — Plan of Care (Signed)
D- Patient alert and oriented. Patient presents in a preoccupied, but pleasant mood on assessment stating to this writer that he feels like the medication he's getting is "too heavy, I have no energy, my bones hurt, and can't think".  Patient rated his depression a "9/10" and his anxiety a "15/10" stating "I don't want to live in this body if you're going to keep pumping me full of meds" and also states that "I'm going through withdrawals". Patient denies SI, HI, AVH, and pain at this time stating to this writer "hell no". Patient has no goals for today.  A- Scheduled medications administered to patient, per MD orders. Support and encouragement provided.  Routine safety checks conducted every 15 minutes.  Patient informed to notify staff with problems or concerns.  R- No adverse drug reactions noted. Patient contracts for safety at this time. Patient compliant with medications and treatment plan. Patient receptive, calm, and cooperative. Patient interacts well with others on the unit.  Patient remains safe at this time.   Problem: Education: Goal: Knowledge of [facility] Education information/materials will improve Outcome: Progressing   Problem: Coping: Goal: Ability to verbalize frustrations and anger appropriately will improve Outcome: Progressing Goal: Ability to demonstrate self-control will improve Outcome: Progressing   Problem: Safety: Goal: Periods of time without injury will increase Outcome: Progressing   Problem: Education: Goal: Will be free of psychotic symptoms Outcome: Progressing Goal: Knowledge of the prescribed therapeutic regimen will improve Outcome: Progressing

## 2018-10-24 NOTE — Progress Notes (Signed)
Patient refused his evening Zyprexa because "all I need is my Abilify 5 mg". MD is aware of patient's refusal and only wanting to take Abilify.

## 2018-10-24 NOTE — [facility] Group Notes (Signed)
LCSW Group Therapy Note 10/24/2018 1:15pm  Type of Therapy and Topic: Group Therapy: Feelings Around Returning Home & Establishing a Supportive Framework and Supporting Oneself When Supports Not Available  Participation Level: Active  Description of Group:  Patients first processed thoughts and feelings about upcoming discharge. These included fears of upcoming changes, lack of change, new living environments, judgements and expectations from others and overall stigma of mental health issues. The group then discussed the definition of a supportive framework, what that looks and feels like, and how do to discern it from an unhealthy non-supportive network. The group identified different types of supports as well as what to do when your family/friends are less than helpful or unavailable  Therapeutic Goals  1. Patient will identify one healthy supportive network that they can use at discharge. 2. Patient will identify one factor of a supportive framework and how to tell it from an unhealthy network. 3. Patient able to identify one coping skill to use when they do not have positive supports from others. 4. Patient will demonstrate ability to communicate their needs through discussion and/or role plays.  Summary of Patient Progress:  The patient reported he feels "all over the place." Pt engaged during group session. As patients processed their anxiety about discharge and described healthy supports patient shared he is ready to be discharge. He stated, "I am having these withdrawals because they changed my medications." He reported he does not have a support system.  Patients identified at least one self-care tool they were willing to use after discharge.   Therapeutic Modalities Cognitive Behavioral Therapy Motivational Interviewing   Darvin Dials  [name], LCSW 10/24/2018 8:18 AM

## 2018-10-25 MED ORDER — ARIPIPRAZOLE 5 MG PO TABS
15.0000 mg | ORAL_TABLET | Freq: Every day | ORAL | Status: DC
  Administered 2018-10-25 – 2018-12-07 (×44): 15 mg via ORAL
  Filled 2018-10-25 (×44): qty 1

## 2018-10-25 NOTE — [facility] Group Notes (Signed)
LCSW Group Therapy Note   10/25/2018 1:00 PM  Type of Therapy and Topic:  Group Therapy:  Overcoming Obstacles   Participation Level:  Minimal   Description of Group:    In this group patients will be encouraged to explore what they see as obstacles to their own wellness and recovery. They will be guided to discuss their thoughts, feelings, and behaviors related to these obstacles. The group will process together ways to cope with barriers, with attention given to specific choices patients can make. Each patient will be challenged to identify changes they are motivated to make in order to overcome their obstacles. This group will be process-oriented, with patients participating in exploration of their own experiences as well as giving and receiving support and challenge from other group members.   Therapeutic Goals: 1. Patient will identify personal and current obstacles as they relate to admission. 2. Patient will identify barriers that currently interfere with their wellness or overcoming obstacles.  3. Patient will identify feelings, thought process and behaviors related to these barriers. 4. Patient will identify two changes they are willing to make to overcome these obstacles:      Summary of Patient Progress Patient was present in group, however he did not engage in most of discussions.  Patient reports that an obstacle he has been facing is "getting my guardianship back and moving out my group home".  Patient reports that he is fighting gravity as he struggles with his obstacles.     Therapeutic Modalities:   Cognitive Behavioral Therapy Solution Focused Therapy Motivational Interviewing Relapse Prevention Therapy  Penni Homans, MSW, LCSW 10/25/2018 12:51 PM

## 2018-10-25 NOTE — Progress Notes (Signed)
Patient refused his morning medication and is adamant that he wants to be put back on Abilify. This Clinical research associate will notify MD.

## 2018-10-25 NOTE — Progress Notes (Signed)
Pastoral Care Visit   10/25/18 1100  Clinical Encounter Type  Visited With Patient  Visit Type Follow-up;Psychological support;Spiritual support  Referral From Patient  Consult/Referral To Chaplain  Spiritual Encounters  Spiritual Needs Emotional;Prayer  Stress Factors  Patient Stress Factors Family relationships;Other (Comment) (not feeling like his health wishes being respected )   Pt was resting in his bed when chap came to visit. Pt was much calmer and peaceful than last visit. Pt reports to only be on Abilify and feels like his thinking is clearer, though still does not feel fully himself.  Pt calmly communicated his distrust of his mother, staff at group home.  Pt feels he should not be under his mother's control and suggests her power/money are the way she is able to keep control of him.  Pt though frustrated with life circumstances, demonstrates as much more peaceful and optimistic than previous visit.  Pt was grateful for visit and wanted prayer.    Milinda Antis, [address]

## 2018-10-25 NOTE — Plan of Care (Signed)
D- Patient alert and oriented. Patient presents in a pleasant mood on assessment stating that he slept "decent" last night and had no major complaints to voice to this Clinical research associate. Patient denied any signs/symptoms of depression and anxiety to this Clinical research associate. Patient also denied SI, HI, AVH, and pain at this time. Patient had no stated goals for today.  A- Support and encouragement provided.  Routine safety checks conducted every 15 minutes. Patient informed to notify staff with problems or concerns.  R- Patient contracts for safety at this time. Patient compliant with treatment plan. Patient receptive, calm, and cooperative. Patient interacts well with others on the unit.  Patient remains safe at this time.   Problem: Education: Goal: Knowledge of  General Education information/materials will improve Outcome: Progressing   Problem: Coping: Goal: Ability to verbalize frustrations and anger appropriately will improve Outcome: Progressing Goal: Ability to demonstrate self-control will improve Outcome: Progressing   Problem: Safety: Goal: Periods of time without injury will increase Outcome: Progressing   Problem: Education: Goal: Will be free of psychotic symptoms Outcome: Progressing Goal: Knowledge of the prescribed therapeutic regimen will improve Outcome: Progressing

## 2018-10-25 NOTE — Progress Notes (Signed)
Recreation Therapy Notes  Date: 10/25/2018  Time: 9:30 am  Location: Craft Room  Behavioral response: Appropriate  Intervention Topic: Goals  Discussion/Intervention:  Group content on today was focused on goals. Patients described what goals are and how they define goals. Individuals expressed how they go about setting goals and reaching them. The group identified how important goals are and if they make short term goals to reach long term goals. Patients described how many goals they work on at a time and what affects them not reaching their goal. Individuals described how much time they put into planning and obtaining their goals. The group participated in the intervention "My Goal Board" and made personal goal boards to help them achieve their goal. Clinical Observations/Feedback:  Patient came to group and defined goals as something people try to achieve. He explained that goals are set in his heart and are things that he wants to do. Individual was social with peers and staff while participating in group.  Namiyah Grantham LRT/CTRS       Franceska Strahm 10/25/2018 11:05 AM

## 2018-10-25 NOTE — Progress Notes (Signed)
[facility] MD Progress Note  10/25/2018 4:54 PM Jackson [name]  MRN:  [id number] Subjective: Patient seen chart reviewed.  Patient with bipolar disorder.  Since I last saw him an echocardiogram was done at his request and was normal.  Patient still has a standing order for forced medicines.  Looking back in the chart it appears that at least for the last day or so that has not been enforced.  Patient has been allowed to refuse his medicine and no forced medicine was given.  Patient is angry irritable and labile again today.  Wants to demand that he only be given 5 mg of Abilify as his only medicine. Principal Problem: Schizoaffective disorder, bipolar type (HCC) Diagnosis: Principal Problem:   Schizoaffective disorder, bipolar type (HCC) Active Problems:   Noncompliance  Total Time spent with patient: 30 minutes  Past Psychiatric History: History of bipolar disorder with extended hospital stays  Past Medical History:  Past Medical History:  Diagnosis Date  . Bipolar 1 disorder (HCC)   . Schizoaffective disorder (HCC)    History reviewed. No pertinent surgical history. Family History: History reviewed. No pertinent family history. Family Psychiatric  History: See previous Social History:  Social History   Substance and Sexual Activity  Alcohol Use Not on file     Social History   Substance and Sexual Activity  Drug Use Not on file    Social History   Socioeconomic History  . Marital status: Single    Spouse name: Not on file  . Number of children: Not on file  . Years of education: Not on file  . Highest education level: Not on file  Occupational History  . Not on file  Social Needs  . Financial resource strain: Not on file  . Food insecurity:    Worry: Not on file    Inability: Not on file  . Transportation needs:    Medical: Not on file    Non-medical: Not on file  Tobacco Use  . Smoking status: Light Tobacco Smoker    Packs/day: 0.25    Types: Cigarettes  .  Smokeless tobacco: Never Used  Substance and Sexual Activity  . Alcohol use: Not on file  . Drug use: Not on file  . Sexual activity: Not on file  Lifestyle  . Physical activity:    Days per week: Not on file    Minutes per session: Not on file  . Stress: Not on file  Relationships  . Social connections:    Talks on phone: Not on file    Gets together: Not on file    Attends religious service: Not on file    Active member of club or organization: Not on file    Attends meetings of clubs or organizations: Not on file    Relationship status: Not on file  Other Topics Concern  . Not on file  Social History Narrative  . Not on file   Additional Social History:                         Sleep: Fair  Appetite:  Fair  Current Medications: Current Facility-Administered Medications  Medication Dose Route Frequency Provider Last Rate Last Dose  . acetaminophen (TYLENOL) tablet 650 mg  650 mg Oral Q6H PRN [name], MD      . alum & mag hydroxide-simeth (MAALOX/MYLANTA) 200-200-20 MG/5ML suspension 30 mL  30 mL Oral Q4H PRN [name], MD   30 mL  at 10/24/18 2119  . ARIPiprazole (ABILIFY) tablet 15 mg  15 mg Oral Daily [name], [name], MD      . hydrOXYzine (ATARAX/VISTARIL) tablet 50 mg  50 mg Oral Q4H PRN [name], MD      . LORazepam (ATIVAN) injection 2 mg  2 mg Intramuscular BID PRN [name], [name], MD      . magnesium hydroxide (MILK OF MAGNESIA) suspension 30 mL  30 mL Oral Daily PRN [name], MD      . OLANZapine (ZYPREXA) injection 10 mg  10 mg Intramuscular Q6H PRN [name], [name], MD      . OLANZapine (ZYPREXA) injection 15 mg  15 mg Intramuscular BID PRN [name], [name], MD   15 mg at 10/20/18 1539  . traZODone (DESYREL) tablet 100 mg  100 mg Oral QHS PRN [name], MD        Lab Results: No results found for this or any previous visit (from the past 48 hour(s)).  Blood Alcohol level:  Lab Results  Component  Value Date   ETH <10 10/16/2018    Metabolic Disorder Labs: Lab Results  Component Value Date   HGBA1C 5.2 10/23/2018   MPG 102.54 10/23/2018   No results found for: PROLACTIN Lab Results  Component Value Date   CHOL 129 10/23/2018   TRIG 58 10/23/2018   HDL 50 10/23/2018   CHOLHDL 2.6 10/23/2018   VLDL 12 10/23/2018   LDLCALC 67 10/23/2018    Physical Findings: AIMS: Facial and Oral Movements Muscles of Facial Expression: None, normal Lips and Perioral Area: None, normal Jaw: None, normal Tongue: None, normal,Extremity Movements Upper (arms, wrists, hands, fingers): None, normal Lower (legs, knees, ankles, toes): None, normal, Trunk Movements Neck, shoulders, hips: None, normal, Overall Severity Severity of abnormal movements (highest score from questions above): None, normal Incapacitation due to abnormal movements: None, normal Patient's awareness of abnormal movements (rate only patient's report): No Awareness, Dental Status Current problems with teeth and/or dentures?: No Does patient usually wear dentures?: No  CIWA:  CIWA-Ar Total: 0 COWS:  COWS Total Score: 0  Musculoskeletal: Strength & Muscle Tone: within normal limits Gait & Station: normal Patient leans: N/A  Psychiatric Specialty Exam: Physical Exam  Nursing note and vitals reviewed. Constitutional: He appears well-developed and well-nourished.  HENT:  Head: Normocephalic and atraumatic.  Eyes: Pupils are equal, round, and reactive to light. Conjunctivae are normal.  Neck: Normal range of motion.  Cardiovascular: Regular rhythm and normal heart sounds.  Respiratory: Effort normal. No respiratory distress.  GI: Soft.  Musculoskeletal: Normal range of motion.  Neurological: He is alert.  Skin: Skin is warm and dry.  Psychiatric: His affect is labile and inappropriate. His speech is rapid and/or pressured and tangential. He is agitated. He is not aggressive. Thought content is delusional. Cognition  and memory are impaired. He expresses impulsivity and inappropriate judgment. He expresses no homicidal and no suicidal ideation.    Review of Systems  Constitutional: Negative.   HENT: Negative.   Eyes: Negative.   Respiratory: Negative.   Cardiovascular: Negative.   Gastrointestinal: Negative.   Musculoskeletal: Negative.   Skin: Negative.   Neurological: Negative.   Psychiatric/Behavioral: Negative.     Blood pressure 97/67, pulse 66, temperature 97.9 F (36.6 C), temperature source Oral, resp. rate 18, height 5\' 11"  (1.803 m), weight 88.5 kg, SpO2 100 %.Body mass index is 27.2 kg/m.  General Appearance: Casual  Eye Contact:  Good  Speech:  Pressured  Volume:  Increased  Mood:  Euphoric  Affect:  Congruent  Thought Process:  Disorganized  Orientation:  Full (Time, Place, and Person)  Thought Content:  Illogical, Delusions, Paranoid Ideation, Rumination and Tangential  Suicidal Thoughts:  No  Homicidal Thoughts:  No  Memory:  Immediate;   Fair Recent;   Fair Remote;   Poor  Judgement:  Impaired  Insight:  Lacking  Psychomotor Activity:  Restlessness  Concentration:  Concentration: Poor  Recall:  Poor  Fund of Knowledge:  Fair  Language:  Fair  Akathisia:  No  Handed:  Right  AIMS (if indicated):     Assets:  Physical Health  ADL's:  Intact  Cognition:  Impaired,  Mild  Sleep:  Number of Hours: 7.3     Treatment Plan Summary: Daily contact with patient to assess and evaluate symptoms and progress in treatment, Medication management and Plan Patient remains disorganized manic hyperactive with poor insight.  Spoke with nursing about the fourth medicine order.  I am dubious as to whether that is going to be enforced regularly and the patient is offering now to take Abilify as an alternative.  I explained to him several times that the low dose of Abilify was unlikely to be effective for his condition.  Tried to convince him to take a mood stabilizer.  Ultimately he would  only agree to Abilify 10 mg/day.  At least if he is telling the truth about taking it that will be some medicine that he is on.  Continue monitoring and trying to get his insight improved.  Mordecai Rasmussen, MD 10/25/2018, 4:54 PM

## 2018-10-25 NOTE — Progress Notes (Signed)
Patient is showing minimal treatment response , expressing feeling of sedation with his current  Medications of Depakote and  Zyprexa, said he will rather prefer to take Abilify in there place, noted.patient is alert stable and redirectable , contract for safety of self and others , little socialization with peers , appetite is good denies feeling depressed and  Anxious but patient appear irritable upon approach , denies SI/HI/AVH , patient remain safe in the unit no out burst or violent behaviors , sleep is continuous no interruptions onlly requiring 15 minutes safety rounding for safety no distress.

## 2018-10-25 NOTE — Plan of Care (Signed)
Patient is in his room this evening refusing to go for snack, states I'm not hungry, appear irritable and depressed, reports that his medication is wearing me  down, states I can't  continue taking this medications they are giving me, I'm  tired of it. No medication is offered to him at this time, patient denies feeling suicidal, homicidal, and denies feeling hallucinations and denies delusions , patient denies any physical complains, sleep is good and continuous requiring only 15 minutes checks for safety no distress noted.   Problem: Education: Goal: Knowledge of [facility] Education information/materials will improve Outcome: Progressing   Problem: Coping: Goal: Ability to verbalize frustrations and anger appropriately will improve Outcome: Progressing Goal: Ability to demonstrate self-control will improve Outcome: Progressing   Problem: Safety: Goal: Periods of time without injury will increase Outcome: Progressing   Problem: Education: Goal: Will be free of psychotic symptoms Outcome: Progressing Goal: Knowledge of the prescribed therapeutic regimen will improve Outcome: Progressing

## 2018-10-26 NOTE — Progress Notes (Signed)
[facility] MD Progress Note  10/26/2018 6:16 PM Jackson [name]  MRN:  [id number] Subjective: Follow-up for this gentleman with schizoaffective disorder.  Patient has no new complaints today other than to complain that he was nauseous and supposedly could not sleep last night which he blames on the low dose of Abilify.  Affect is fairly calm although he did get labile for part of the interview and after the interview impulsively hugged me.  Denies suicidal or homicidal ideation.  Still has poor insight and gets defensive easily. Principal Problem: Schizoaffective disorder, bipolar type (HCC) Diagnosis: Principal Problem:   Schizoaffective disorder, bipolar type (HCC) Active Problems:   Noncompliance  Total Time spent with patient: 30 minutes  Past Psychiatric History: Past history of bipolar disorder or schizoaffective disorder with lengthy hospitalizations  Past Medical History:  Past Medical History:  Diagnosis Date  . Bipolar 1 disorder (HCC)   . Schizoaffective disorder (HCC)    History reviewed. No pertinent surgical history. Family History: History reviewed. No pertinent family history. Family Psychiatric  History: See previous Social History:  Social History   Substance and Sexual Activity  Alcohol Use Not on file     Social History   Substance and Sexual Activity  Drug Use Not on file    Social History   Socioeconomic History  . Marital status: Single    Spouse name: Not on file  . Number of children: Not on file  . Years of education: Not on file  . Highest education level: Not on file  Occupational History  . Not on file  Social Needs  . Financial resource strain: Not on file  . Food insecurity:    Worry: Not on file    Inability: Not on file  . Transportation needs:    Medical: Not on file    Non-medical: Not on file  Tobacco Use  . Smoking status: Light Tobacco Smoker    Packs/day: 0.25    Types: Cigarettes  . Smokeless tobacco: Never Used  Substance and  Sexual Activity  . Alcohol use: Not on file  . Drug use: Not on file  . Sexual activity: Not on file  Lifestyle  . Physical activity:    Days per week: Not on file    Minutes per session: Not on file  . Stress: Not on file  Relationships  . Social connections:    Talks on phone: Not on file    Gets together: Not on file    Attends religious service: Not on file    Active member of club or organization: Not on file    Attends meetings of clubs or organizations: Not on file    Relationship status: Not on file  Other Topics Concern  . Not on file  Social History Narrative  . Not on file   Additional Social History:                         Sleep: Fair  Appetite:  Fair  Current Medications: Current Facility-Administered Medications  Medication Dose Route Frequency Provider Last Rate Last Dose  . acetaminophen (TYLENOL) tablet 650 mg  650 mg Oral Q6H PRN [name], MD      . alum & mag hydroxide-simeth (MAALOX/MYLANTA) 200-200-20 MG/5ML suspension 30 mL  30 mL Oral Q4H PRN [name], MD   30 mL at 10/26/18 0029  . ARIPiprazole (ABILIFY) tablet 15 mg  15 mg Oral Daily Jaaziel Peatross, [name], MD  15 mg at 10/26/18 0739  . hydrOXYzine (ATARAX/VISTARIL) tablet 50 mg  50 mg Oral Q4H PRN [name], MD   50 mg at 10/26/18 0034  . LORazepam (ATIVAN) injection 2 mg  2 mg Intramuscular BID PRN Xzaiver Vayda T, MD      . magnesium hydroxide (MILK OF MAGNESIA) suspension 30 mL  30 mL Oral Daily PRN [name], MD      . OLANZapine [facility]) injection 10 mg  10 mg Intramuscular Q6H PRN Gabrielle Mester T, MD      . OLANZapine (ZYPREXA) injection 15 mg  15 mg Intramuscular BID PRN Eliam Snapp, [name], MD   15 mg at 10/20/18 1539  . traZODone (DESYREL) tablet 100 mg  100 mg Oral QHS PRN [name], MD   100 mg at 10/26/18 0034    Lab Results: No results found for this or any previous visit (from the past 48 hour(s)).  Blood Alcohol level:  Lab  Results  Component Value Date   ETH <10 10/16/2018    Metabolic Disorder Labs: Lab Results  Component Value Date   HGBA1C 5.2 10/23/2018   MPG 102.54 10/23/2018   No results found for: PROLACTIN Lab Results  Component Value Date   CHOL 129 10/23/2018   TRIG 58 10/23/2018   HDL 50 10/23/2018   CHOLHDL 2.6 10/23/2018   VLDL 12 10/23/2018   LDLCALC 67 10/23/2018    Physical Findings: AIMS: Facial and Oral Movements Muscles of Facial Expression: None, normal Lips and Perioral Area: None, normal Jaw: None, normal Tongue: None, normal,Extremity Movements Upper (arms, wrists, hands, fingers): None, normal Lower (legs, knees, ankles, toes): None, normal, Trunk Movements Neck, shoulders, hips: None, normal, Overall Severity Severity of abnormal movements (highest score from questions above): None, normal Incapacitation due to abnormal movements: None, normal Patient's awareness of abnormal movements (rate only patient's report): No Awareness, Dental Status Current problems with teeth and/or dentures?: No Does patient usually wear dentures?: No  CIWA:  CIWA-Ar Total: 0 COWS:  COWS Total Score: 0  Musculoskeletal: Strength & Muscle Tone: within normal limits Gait & Station: normal Patient leans: N/A  Psychiatric Specialty Exam: Physical Exam  Nursing note and vitals reviewed. Constitutional: He appears well-developed and well-nourished.  HENT:  Head: Normocephalic and atraumatic.  Eyes: Pupils are equal, round, and reactive to light. Conjunctivae are normal.  Neck: Normal range of motion.  Cardiovascular: Regular rhythm and normal heart sounds.  Respiratory: Effort normal.  GI: Soft.  Musculoskeletal: Normal range of motion.  Neurological: He is alert.  Skin: Skin is warm and dry.  Psychiatric: His affect is labile and inappropriate. His speech is rapid and/or pressured. He is agitated. He is not aggressive. Thought content is paranoid. Cognition and memory are  impaired. He expresses impulsivity.    Review of Systems  Constitutional: Negative.   HENT: Negative.   Eyes: Negative.   Respiratory: Negative.   Cardiovascular: Negative.   Gastrointestinal: Negative.   Musculoskeletal: Negative.   Skin: Negative.   Neurological: Negative.   Psychiatric/Behavioral: Negative.     Blood pressure 110/74, pulse 73, temperature 98.7 F (37.1 C), temperature source Oral, resp. rate 18, height 5\' 11"  (1.803 m), weight 88.5 kg, SpO2 100 %.Body mass index is 27.2 kg/m.  General Appearance: Casual  Eye Contact:  Good  Speech:  Pressured  Volume:  Increased  Mood:  Irritable  Affect:  Labile  Thought Process:  Coherent  Orientation:  Full (Time, Place, and Person)  Thought  Content:  Paranoid Ideation  Suicidal Thoughts:  No  Homicidal Thoughts:  No  Memory:  Immediate;   Fair Recent;   Fair Remote;   Fair  Judgement:  Impaired  Insight:  Shallow  Psychomotor Activity:  Increased  Concentration:  Concentration: Fair  Recall:  [employer] of Knowledge:  Fair  Language:  Fair  Akathisia:  No  Handed:  Right  AIMS (if indicated):     Assets:  Manufacturing systems engineer Physical Health Resilience Social Support  ADL's:  Intact  Cognition:  WNL  Sleep:  Number of Hours: 6     Treatment Plan Summary: Daily contact with patient to assess and evaluate symptoms and progress in treatment, Medication management and Plan Continue the current Abilify since he is at least compliant with it.  Encourage patient to continue with his appropriate interactions with others.  We might want to start soon exploring the possibility of whether his group home will take him back or where his ultimate discharge plan will be although at this point we have referred him to [facility] out of an abundance of concern that he may require longer term hospitalization if he remains noncompliant with treatment and placement.  Mordecai Rasmussen, MD 10/26/2018, 6:16 PM

## 2018-10-26 NOTE — Progress Notes (Signed)
Recreation Therapy Notes    Date: 10/26/2018  Time: 9:30 am  Location: Craft Room   Behavioral response: Appropriate, Hyperverbal   Intervention Topic: Values  Discussion/Intervention:  Group content today was focused on values. The group identified what values are and where they come from. Individuals expressed some values and how many they have. Patients described how they go about add or removing values. The group described the importance of having values and how they go about using them in daily life. Patient participated in the intervention "My Values" where they were able to pick out values that were important to them and make a visual aide.  Clinical Observations/Feedback:  Patient came to group and defined values as thing he values such as materialistic thing and morals. He identified his values as charity and compassion. Participant was on and off topic several times and had to be redirected by group facilitator. Individual was social with peers and staff while participating in group.  [name] LRT/CTRS          Jackson [name] 10/26/2018 10:59 AM

## 2018-10-26 NOTE — BHH Counselor (Signed)
CSW faxed over referral for [facility] and to [facility] for authorization number.    CSW received confirmation that fax was successful.   Penni Homans, MSW, LCSW 10/26/2018 1:00 PM

## 2018-10-26 NOTE — Progress Notes (Signed)
D: Pt during assessments denies SI/HI/AVH, able to contract for safety. Pt is mostly pleasant and cooperative with direction. Pt. Continues to present with somatic complaints, grandiosity, and disorganized thought process, but to a lesser degree overall. Pt. Mood is labile, but describes his mood as dysphoric. Pt. Expresses being tired from the medication, but unable to sleep. Will relay this to treatment team. Pt. Expresses some anxiety and depression, but not specific or able to elaborate on severity.   A: Q x 15 minute observation checks to be completed for safety. Patient was provided with education, but continues to be non-accepting of this. Patient was given/offered medications per orders. Patient  was encourage to attend groups, participate in unit activities and continue with plan of care. Pt. Chart and plans of care reviewed. Pt. Given support and encouragement.   R: Patient is complaint with medications with direction and encouragement, but still continues to be resistant to care. Compliance is easier to obtain though. Pt. Eating good.

## 2018-10-26 NOTE — B[facility] Group Notes (Signed)
Feelings Around Diagnosis 10/26/2018 1PM  Type of Therapy/Topic:  Group Therapy:  Feelings about Diagnosis  Participation Level:  Did Not Attend   Description of Group:   This group will allow patients to explore their thoughts and feelings about diagnoses they have received. Patients will be guided to explore their level of understanding and acceptance of these diagnoses. Facilitator will encourage patients to process their thoughts and feelings about the reactions of others to their diagnosis and will guide patients in identifying ways to discuss their diagnosis with significant others in their lives. This group will be process-oriented, with patients participating in exploration of their own experiences, giving and receiving support, and processing challenge from other group members.   Therapeutic Goals: 1. Patient will demonstrate understanding of diagnosis as evidenced by identifying two or more symptoms of the disorder 2. Patient will be able to express two feelings regarding the diagnosis 3. Patient will demonstrate their ability to communicate their needs through discussion and/or role play  Summary of Patient Progress:       Therapeutic Modalities:   Cognitive Behavioral Therapy Brief Therapy Feelings Identification    Suzan Slick, LCSW 10/26/2018 2:33 PM

## 2018-10-26 NOTE — Plan of Care (Signed)
Pt. Continues to present with somatic complaints, grandiosity, and disorganized thought process, but to a lesser degree overall. Pt. Is calmer and compliance is easier to obtain for required treatment. Pt. Denies si/hi/avh, able to contract for safety. Pt. Self control is observed improved.    Problem: Coping: Goal: Ability to demonstrate self-control will improve Outcome: Progressing   Problem: Safety: Goal: Periods of time without injury will increase 10/26/2018 1032 by Lenox Ponds, RN Outcome: Progressing 10/26/2018 0833 by Lenox Ponds, RN Outcome: Progressing   Problem: Education: Goal: Will be free of psychotic symptoms 10/26/2018 1032 by Lenox Ponds, RN Outcome: Progressing 10/26/2018 0833 by Lenox Ponds, RN Outcome: Progressing

## 2018-10-26 NOTE — B[facility] Counselor (Signed)
CSW attempted to call the patient's group home owner, Anthony Sar, [phone], however was unable to speak with him and left a voicemail message requesting a return phone call.   Penni Homans, MSW, LCSW 10/26/2018 11:05 AM

## 2018-10-27 NOTE — B[facility] Group Notes (Signed)
LCSW Group Therapy Note  10/27/2018 12:55 PM  Type of Therapy/Topic:  Group Therapy:  Emotion Regulation  Participation Level:  Active   Description of Group:   The purpose of this group is to assist patients in learning to regulate negative emotions and experience positive emotions. Patients will be guided to discuss ways in which they have been vulnerable to their negative emotions. These vulnerabilities will be juxtaposed with experiences of positive emotions or situations, and patients will be challenged to use positive emotions to combat negative ones. Special emphasis will be placed on coping with negative emotions in conflict situations, and patients will process healthy conflict resolution skills.  Therapeutic Goals: 1. Patient will identify two positive emotions or experiences to reflect on in order to balance out negative emotions 2. Patient will label two or more emotions that they find the most difficult to experience 3. Patient will demonstrate positive conflict resolution skills through discussion and/or role plays  Summary of Patient Progress: Pt was appropriate and respectful in group. Pt came in to group late, but participated well while in group. Pt reported feeling over medicated and that he is not himself. Pt reported coping strategies as religion, talking with others, and sleeping.   Therapeutic Modalities:   Cognitive Behavioral Therapy Feelings Identification Dialectical Behavioral Therapy   Iris Pert, MSW, LCSW Clinical Social Work 10/27/2018 12:55 PM

## 2018-10-27 NOTE — Plan of Care (Signed)
Patient is alert to self, place and time. Patient is very animated this morning, talking with staff and fellow peers. Patient complains of insomnia. Patient states, "I am getting hyped up and high on this Abilify." Patient is knowledgeable about medications. Patient took morning medications. Patient speech is rapid at times and loud. Patient denies SI, HI and AVH. Safety checks to continue Q 15 minutes.  Problem: Education: Goal: Knowledge of [facility] Education information/materials will improve Outcome: Progressing   Problem: Coping: Goal: Ability to verbalize frustrations and anger appropriately will improve Outcome: Progressing Goal: Ability to demonstrate self-control will improve Outcome: Progressing   Problem: Safety: Goal: Periods of time without injury will increase Outcome: Progressing   Problem: Education: Goal: Will be free of psychotic symptoms Outcome: Progressing Goal: Knowledge of the prescribed therapeutic regimen will improve Outcome: Progressing

## 2018-10-27 NOTE — Progress Notes (Signed)
[facility] MD Progress Note  10/27/2018 5:04 PM Jackson [name]  MRN:  [id number] Subjective: Follow-up for this man with schizoaffective disorder bipolar type.  Continues to show evidence of manic symptoms.  Hyperverbal.  Intrusive.  Euphoric and labile affect.  Poor insight.  He is compliant with the Abilify 15 mg.  Still not sleeping well.  Patient however still refuses to take any more medicine than what he has been prescribed.  Denies hallucinations.  Denies suicidal or homicidal ideation.  Mostly gets along with his peers without difficulty. Principal Problem: Schizoaffective disorder, bipolar type (HCC) Diagnosis: Principal Problem:   Schizoaffective disorder, bipolar type (HCC) Active Problems:   Noncompliance  Total Time spent with patient: 30 minutes  Past Psychiatric History: Patient has a history of schizoaffective disorder with lengthy hospitalizations in the past and more recent noncompliance with treatment  Past Medical History:  Past Medical History:  Diagnosis Date  . Bipolar 1 disorder (HCC)   . Schizoaffective disorder (HCC)    History reviewed. No pertinent surgical history. Family History: History reviewed. No pertinent family history. Family Psychiatric  History: None known Social History:  Social History   Substance and Sexual Activity  Alcohol Use Not on file     Social History   Substance and Sexual Activity  Drug Use Not on file    Social History   Socioeconomic History  . Marital status: Single    Spouse name: Not on file  . Number of children: Not on file  . Years of education: Not on file  . Highest education level: Not on file  Occupational History  . Not on file  Social Needs  . Financial resource strain: Not on file  . Food insecurity:    Worry: Not on file    Inability: Not on file  . Transportation needs:    Medical: Not on file    Non-medical: Not on file  Tobacco Use  . Smoking status: Light Tobacco Smoker    Packs/day: 0.25   Types: Cigarettes  . Smokeless tobacco: Never Used  Substance and Sexual Activity  . Alcohol use: Not on file  . Drug use: Not on file  . Sexual activity: Not on file  Lifestyle  . Physical activity:    Days per week: Not on file    Minutes per session: Not on file  . Stress: Not on file  Relationships  . Social connections:    Talks on phone: Not on file    Gets together: Not on file    Attends religious service: Not on file    Active member of club or organization: Not on file    Attends meetings of clubs or organizations: Not on file    Relationship status: Not on file  Other Topics Concern  . Not on file  Social History Narrative  . Not on file   Additional Social History:                         Sleep: Poor  Appetite:  Good  Current Medications: Current Facility-Administered Medications  Medication Dose Route Frequency Provider Last Rate Last Dose  . acetaminophen (TYLENOL) tablet 650 mg  650 mg Oral Q6H PRN [name], MD   650 mg at 10/26/18 2302  . alum & mag hydroxide-simeth (MAALOX/MYLANTA) 200-200-20 MG/5ML suspension 30 mL  30 mL Oral Q4H PRN [name], MD   30 mL at 10/26/18 2129  . ARIPiprazole (ABILIFY) tablet 15  mg  15 mg Oral Daily Keirston Saephanh T, MD   15 mg at 10/27/18 0756  . hydrOXYzine (ATARAX/VISTARIL) tablet 50 mg  50 mg Oral Q4H PRN [name], MD   50 mg at 10/26/18 2301  . LORazepam (ATIVAN) injection 2 mg  2 mg Intramuscular BID PRN Orion Mole T, MD      . magnesium hydroxide (MILK OF MAGNESIA) suspension 30 mL  30 mL Oral Daily PRN [name], MD      . OLANZapine [facility]) injection 10 mg  10 mg Intramuscular Q6H PRN Gali Spinney T, MD      . OLANZapine (ZYPREXA) injection 15 mg  15 mg Intramuscular BID PRN Doratha Mcswain, [name], MD   15 mg at 10/20/18 1539  . traZODone (DESYREL) tablet 100 mg  100 mg Oral QHS PRN [name], MD   100 mg at 10/26/18 0034    Lab Results: No results found  for this or any previous visit (from the past 48 hour(s)).  Blood Alcohol level:  Lab Results  Component Value Date   ETH <10 10/16/2018    Metabolic Disorder Labs: Lab Results  Component Value Date   HGBA1C 5.2 10/23/2018   MPG 102.54 10/23/2018   No results found for: PROLACTIN Lab Results  Component Value Date   CHOL 129 10/23/2018   TRIG 58 10/23/2018   HDL 50 10/23/2018   CHOLHDL 2.6 10/23/2018   VLDL 12 10/23/2018   LDLCALC 67 10/23/2018    Physical Findings: AIMS: Facial and Oral Movements Muscles of Facial Expression: None, normal Lips and Perioral Area: None, normal Jaw: None, normal Tongue: None, normal,Extremity Movements Upper (arms, wrists, hands, fingers): None, normal Lower (legs, knees, ankles, toes): None, normal, Trunk Movements Neck, shoulders, hips: None, normal, Overall Severity Severity of abnormal movements (highest score from questions above): None, normal Incapacitation due to abnormal movements: None, normal Patient's awareness of abnormal movements (rate only patient's report): No Awareness, Dental Status Current problems with teeth and/or dentures?: No Does patient usually wear dentures?: No  CIWA:  CIWA-Ar Total: 0 COWS:  COWS Total Score: 0  Musculoskeletal: Strength & Muscle Tone: within normal limits Gait & Station: normal Patient leans: N/A  Psychiatric Specialty Exam: Physical Exam  Nursing note and vitals reviewed. Constitutional: He appears well-developed and well-nourished.  HENT:  Head: Normocephalic and atraumatic.  Eyes: Pupils are equal, round, and reactive to light. Conjunctivae are normal.  Neck: Normal range of motion.  Cardiovascular: Regular rhythm and normal heart sounds.  Respiratory: Effort normal.  GI: Soft.  Musculoskeletal: Normal range of motion.  Neurological: He is alert.  Skin: Skin is warm and dry.  Psychiatric: His affect is labile and inappropriate. His speech is rapid and/or pressured. He is  agitated. He is not aggressive. Thought content is paranoid. Cognition and memory are impaired. He expresses impulsivity and inappropriate judgment.    Review of Systems  Constitutional: Negative.   HENT: Negative.   Eyes: Negative.   Respiratory: Negative.   Cardiovascular: Negative.   Gastrointestinal: Negative.   Musculoskeletal: Negative.   Skin: Negative.   Neurological: Negative.   Psychiatric/Behavioral: Negative.     Blood pressure 107/72, pulse 69, temperature 97.9 F (36.6 C), temperature source Oral, resp. rate 16, height  (1.803 m), weight 88.5 kg, SpO2 100 %.Body mass index is 27.2 kg/m.  General Appearance: Casual  Eye Contact:  Good  Speech:  Pressured  Volume:  Increased  Mood:  Euphoric  Affect:  Inappropriate  and Labile  Thought Process:  Disorganized  Orientation:  Full (Time, Place, and Person)  Thought Content:  Illogical and Tangential  Suicidal Thoughts:  No  Homicidal Thoughts:  No  Memory:  Immediate;   Fair Recent;   Fair Remote;   Poor  Judgement:  Impaired  Insight:  Shallow  Psychomotor Activity:  Increased and Restlessness  Concentration:  Concentration: Poor  Recall:  [employer] of Knowledge:  Fair  Language:  Fair  Akathisia:  No  Handed:  Right  AIMS (if indicated):     Assets:  Architect Physical Health Social Support  ADL's:  Intact  Cognition:  WNL  Sleep:  Number of Hours: 5.75     Treatment Plan Summary: Daily contact with patient to assess and evaluate symptoms and progress in treatment, Medication management and Plan Patient continues to take Abilify 15 mg a day.  I have tried to convince him again today that adding a mood stabilizer would help with his sleep and allow him a better chance of stabilizing enough to get out of the hospital.  His insight is poor and he refuses any extra medicine.  Disposition will be difficult.  Group home has evidently given him notice and so we  [facility].  Meanwhile we will then look into other facilities although with the current virus in quarantine will be difficult.  Continue engaging individual and group therapy.  Mordecai Rasmussen, MD 10/27/2018, 5:04 PM

## 2018-10-27 NOTE — BHH Counselor (Signed)
CSW attempted to call the patients group home owner and leave a message. CSW was unable to speak with anyone and left a HIPAA compliant voicemail.  Penni Homans, MSW, LCSW 10/27/2018 11:04 AM

## 2018-10-27 NOTE — BHH Counselor (Signed)
CSW received return call from [facility], [phone], pt's group home owner.  Chales Abrahams reports that patient is NOT able to return to the group home.  He reports that patient is not able to return at this time due to 1. Noncompliance with meds, 2. Aggression, 3 refusal to follow house rules.  He also reports that the patient bought a scooter on his own "which can get me shut down".  He reports that to his knowledge the patient does not have any medical concerns.  He reports that the patient stopped taking his medication on 09/17/2018.  He further reports that the patient became aggressive getting in staff and others in the house face.   Penni Homans, MSW, LCSW 10/27/2018 11:43 AM

## 2018-10-27 NOTE — BHH Group Notes (Signed)
BHH Group Notes:  (Nursing/MHT/Case Management/Adjunct)  Date:  10/27/2018  Time:  10:03 PM  Type of Therapy:  Nurse Education  Participation Level:  Active  Participation Quality:  Sharing  Affect:  Anxious  Cognitive:  Alert  Insight:  Good  Engagement in Group:  Engaged  Modes of Intervention:  Activity and Socialization  Summary of Progress/Problems:  Jackson [name] 10/27/2018, 10:03 PM
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
## 2018-10-27 NOTE — Progress Notes (Signed)
Patient had an episode where he began to yell on the telephone and then proceeded to come to the nurses station and yelling about the " difference between being bipolar and being pissed off."

## 2018-10-27 NOTE — Tx Team (Signed)
Interdisciplinary Treatment and Diagnostic Plan Update  10/27/2018 Time of Session: 8:30AM  Jackson [name] MRN: [id number]  Principal Diagnosis: Schizoaffective disorder, bipolar type [facility])  Secondary Diagnoses: Principal Problem:   Schizoaffective disorder, bipolar type (HCC) Active Problems:   Noncompliance   Current Medications:  Current Facility-Administered Medications  Medication Dose Route Frequency Provider Last Rate Last Dose  . acetaminophen (TYLENOL) tablet 650 mg  650 mg Oral Q6H PRN [name], MD   650 mg at 10/26/18 2302  . alum & mag hydroxide-simeth (MAALOX/MYLANTA) 200-200-20 MG/5ML suspension 30 mL  30 mL Oral Q4H PRN [name], MD   30 mL at 10/26/18 2129  . ARIPiprazole (ABILIFY) tablet 15 mg  15 mg Oral Daily [name], [name], MD   15 mg at 10/27/18 0756  . hydrOXYzine (ATARAX/VISTARIL) tablet 50 mg  50 mg Oral Q4H PRN [name], MD   50 mg at 10/26/18 2301  . LORazepam (ATIVAN) injection 2 mg  2 mg Intramuscular BID PRN [name], [name], MD      . magnesium hydroxide (MILK OF MAGNESIA) suspension 30 mL  30 mL Oral Daily PRN [name], MD      . OLANZapine [facility]) injection 10 mg  10 mg Intramuscular Q6H PRN [name], [name], MD      . OLANZapine (ZYPREXA) injection 15 mg  15 mg Intramuscular BID PRN [name], [name], MD   15 mg at 10/20/18 1539  . traZODone (DESYREL) tablet 100 mg  100 mg Oral QHS PRN [name], MD   100 mg at 10/26/18 0034   PTA Medications: No medications prior to admission.    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   Physician Treatment Plan for Primary Diagnosis: Schizoaffective disorder, bipolar type (HCC) Long Term  Goal(s): Improvement in symptoms so as ready for discharge Improvement in symptoms so as ready for discharge   Short Term Goals: Ability to demonstrate self-control will improve Ability to identify and develop effective coping behaviors will improve Compliance with prescribed medications will improve  Medication Management: Evaluate patient's response, side effects, and tolerance of medication regimen.  Therapeutic Interventions: 1 to 1 [place], Unit Group [place] and Medication administration.  Evaluation of Outcomes: Progressing  Physician Treatment Plan for Secondary Diagnosis: Principal Problem:   Schizoaffective disorder, bipolar type (HCC) Active Problems:   Noncompliance  Long Term Goal(s): Improvement in symptoms so as ready for discharge Improvement in symptoms so as ready for discharge   Short Term Goals: Ability to demonstrate self-control will improve Ability to identify and develop effective coping behaviors will improve Compliance with prescribed medications will improve     Medication Management: Evaluate patient's response, side effects, and tolerance of medication regimen.  Therapeutic Interventions: 1 to 1 [place], Unit Group [place] and Medication administration.  Evaluation of Outcomes: Progressing   RN Treatment Plan for Primary Diagnosis: Schizoaffective disorder, bipolar type (HCC) Long Term Goal(s): Knowledge of disease and therapeutic regimen to maintain health will improve  Short Term Goals: Ability to verbalize frustration and anger appropriately will improve, Ability to demonstrate self-control, Ability to participate in decision making will improve and Ability to verbalize feelings will improve  Medication Management: RN will administer medications as ordered by provider, will assess and evaluate patient's response and provide education to patient for prescribed medication. RN  will report any adverse and/or side effects to prescribing  provider.  Therapeutic Interventions: 1 on 1 counseling Goodness, Psychoeducation, Medication administration, Evaluate responses to treatment, Monitor vital signs and CBGs as ordered, Perform/monitor CIWA, COWS, AIMS and Fall Risk screenings as ordered, Perform wound care treatments as ordered.  Evaluation of Outcomes: Progressing   LCSW Treatment Plan for Primary Diagnosis: Schizoaffective disorder, bipolar type (HCC) Long Term Goal(s): Safe transition to appropriate next level of care at discharge, Engage patient in therapeutic group addressing interpersonal concerns.  Short Term Goals: Engage patient in aftercare planning with referrals and resources, Increase social support, Increase ability to appropriately verbalize feelings, Increase emotional regulation and Facilitate acceptance of mental health diagnosis and concerns  Therapeutic Interventions: Assess for all discharge needs, 1 to 1 time with Social worker, Explore available resources and support systems, Assess for adequacy in community support network, Educate family and significant other(s) on suicide prevention, Complete Psychosocial Assessment, Interpersonal group therapy.  Evaluation of Outcomes: Progressing   Progress in Treatment: Attending groups: Yes. Participating in groups: Yes. Taking medication as prescribed: Yes.  Patient was placed on forced med order. Toleration medication: Yes. Family/Significant other contact made: Yes, individual(s) contacted:  CSW completed SPE with the patients mother.  Patient understands diagnosis: Yes. Discussing patient identified problems/goals with staff: Yes. Medical problems stabilized or resolved: Yes. Denies suicidal/homicidal ideation: Yes. Issues/concerns per patient self-inventory: No. Other: none  New problem(s) identified: No, Describe:  none  New Short Term/Long Term Goal(s): elimination of symptoms of psychosis, medication management for mood stabilization; elimination  of SI thoughts; development of comprehensive mental wellness plan.  Patient Goals:  "independence"  Discharge Plan or Barriers: Pt has a legal guardian, his mother. Patient behaviors have improved. Patient was aggressive when initially taking medications, however appears to have deescalated.  Patient is taking medications at this time, however, often asks the doctor to change or adjust his medications.  CSW is in process of referring the patient to [facility], however, mother has not faxed over the release at this time.    Reason for Continuation of Hospitalization: Aggression Delusions  Hallucinations Mania Medication stabilization  Estimated Length of Stay: 1-5 days  Recreational Therapy: Patient Stressors: N/A Patient Goal: Patient will focus on task/topic with 2 prompts from staff within 5 recreation therapy group Nhem  Attendees: Patient: Jackson [name] 10/27/2018 11:22 AM  Physician: Dr. Toni Amend, MD 10/27/2018 11:22 AM  Nursing:  10/27/2018 11:22 AM  RN Care Manager: 10/27/2018 11:22 AM  Social Worker: Penni Homans, MSW, LCSW 10/27/2018 11:22 AM  Recreational Therapist:  10/27/2018 11:22 AM  Other:  10/27/2018 11:22 AM  Other:  10/27/2018 11:22 AM  Other: 10/27/2018 11:22 AM    Scribe for Treatment Team: Harden Mo, LCSW 10/27/2018 11:22 AM

## 2018-10-27 NOTE — BHH Counselor (Addendum)
CSW followed up with [facility] once she received the fax from mother allowing for contact.    CSW was informed that patient was on the waitlist and that waitlist was "pretty long".  CSW faxed the guardianship and IVC paperwork that was requested.   CSW spoke with staff member, Selena Batten.  Penni Homans, MSW, LCSW 10/27/2018 4:00 PM

## 2018-10-27 NOTE — Progress Notes (Signed)
Patient alert and oriented x 4, denies SI/HI/AVH no disruptive behavior, affect is blunted, he isolates to self, minimal interaction with staff and peers. Patient's thoughts are organized and coherent no bizarre behavior and he is receptive to staff. Patient was complaint with medication. 15 minutes safety checks maintained will continue to monitor.
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
## 2018-10-27 NOTE — Progress Notes (Signed)
Recreation Therapy Notes  Date: 10/27/2018  Time: 9:30 am   Location: Craft room   Behavioral response: N/A   Intervention Topic: Emotions  Discussion/Intervention: Patient did not attend group.   Clinical Observations/Feedback:  Patient did not attend group.   Deniece Rankin LRT/CTRS         Jairo Bellew 10/27/2018 10:56 AM

## 2018-10-28 MED ORDER — DIVALPROEX SODIUM 500 MG PO DR TAB
500.0000 mg | DELAYED_RELEASE_TABLET | Freq: Two times a day (BID) | ORAL | Status: DC
  Administered 2018-10-28 – 2018-11-03 (×11): 500 mg via ORAL
  Filled 2018-10-28 (×12): qty 1

## 2018-10-28 MED ORDER — OLANZAPINE 10 MG PO TABS
15.0000 mg | ORAL_TABLET | Freq: Two times a day (BID) | ORAL | Status: DC
  Administered 2018-10-28 – 2018-10-29 (×3): 15 mg via ORAL
  Filled 2018-10-28 (×4): qty 2

## 2018-10-28 NOTE — BHH Group Notes (Signed)
LCSW Group Therapy Note  10/28/2018 1:00 PM  Type of Therapy/Topic:  Group Therapy:  Balance in Life  Participation Level:  Did Not Attend  Description of Group:    This group will address the concept of balance and how it feels and looks when one is unbalanced. Patients will be encouraged to process areas in their lives that are out of balance and identify reasons for remaining unbalanced. Facilitators will guide patients in utilizing problem-solving interventions to address and correct the stressor making their life unbalanced. Understanding and applying boundaries will be explored and addressed for obtaining and maintaining a balanced life. Patients will be encouraged to explore ways to assertively make their unbalanced needs known to significant others in their lives, using other group members and facilitator for support and feedback.  Therapeutic Goals: 1. Patient will identify two or more emotions or situations they have that consume much of in their lives. 2. Patient will identify signs/triggers that life has become out of balance:  3. Patient will identify two ways to set boundaries in order to achieve balance in their lives:  4. Patient will demonstrate ability to communicate their needs through discussion and/or role plays  Summary of Patient Progress: X  Therapeutic Modalities:   Cognitive Behavioral Therapy Solution-Focused Therapy Assertiveness Training  Penni Homans MSW, LCSW 10/28/2018 2:23 PM

## 2018-10-28 NOTE — Progress Notes (Signed)
Patient placed on locked back hallway due to extreme agitation, hyperactivity, posturing towards MD and nursing staff, and extremely disorganized thought process. Pt. Is banging on the walls with a bible, spitting on the floors, and yelling extremely loud.

## 2018-10-28 NOTE — Progress Notes (Addendum)
Pt. Yelling and running down the yellow hallway stating, "oh yeah, we get to put this nigga to sleep!". Pt. Banging on the hall near the attendings door to his office as well. Pt. MD is  present during this behavior as it is unfolding. Provider placing orders (see MAR).

## 2018-10-28 NOTE — Progress Notes (Signed)
D: Pt during assessments denies si/hi/avh and contracts for safety to not harm self, but his behaviors present as danger to others as evidence by posturing and aggressive behaviors this morning towards staff and hospital property. Pt. Thought process is very disorganized, insight is poor, and his speech is very pressured. Pt. Expresses grandiose, somatic, and hyper-religous delusions/ideations. Pt. Reports he has not slept in 3 days and his energy is, "through the roof!".    A: Q x 1 hour observation checks to be completed for safety. Patient was provided with education, but is non-accepting of information provided and shows no evidence of learning. Patient was given/offered medications per orders. Patient  was encourage to attend groups, participate in unit activities and continue with plan of care, but is resistant. Pt. Chart and plans of care reviewed. Pt. Given support, encouragement, and redirection often.    R: Patient is reluctantly complaint with taking his medications. Pt. This morning very agitated, aggressive, and disorganized in his thought process. Pt. Observed this morning eating good, despite behaviors and presentations recorded. Pt. Is on 1:1 observations and locked back-hallway for safety.

## 2018-10-28 NOTE — Progress Notes (Signed)
1:1 Observation Note  1000 Patient at this time is on the locked back-hallway with this RN for safety. Pt. Continues to be pressured in his speech, disorganized in his thought process, and delusional. Pt. Mood continues to present labile. Will continue to monitor for safety.  1100 Pt. Observed resting in bed, no distress noted. Pt. Remains on 1:1 for aggression and labile mood for safety.  1200 Patient at this time observed in his room eating lunch. Pt. Calmer at this time. Will continue to monitor due to impulsive behaviors.  1300 Pt. Resting on back hallway and also being visited by chaplain. 1:1 maintained for safety at this time. Will continue to monitor.  1400 Pt. Sitting in chair outside of patient's room speaking with this Clinical research associate. Pt. Thought process is disorganized and pressured.  1500 Pt. Observed resting in his room, no distress noted.  1600 Pt. Observed in bed resting at this time. Pt. Educated about upcoming scheduled medication, but continues to be resistant to care and aggravated about having to take medication. Will encourage compliance.   1700 Pt. Complaint with medications and now in bed singing and talking out loud to himself. 1:1 present for safety. 1800 Pt. Resting in bed at this time with 1:1 present for safety. No distress noted.

## 2018-10-28 NOTE — Progress Notes (Signed)
[facility] MD Progress Note  10/28/2018 4:10 PM Jackson [name]  MRN:  [id number] Subjective: Follow-up for this patient with schizoaffective disorder.  He started the day extremely agitated.  Shouting yelling pounding his fist pounding a Bible very disorganized hyperactive lots of flight of ideas.  Mania probably worse with more inappropriate behavior today than previously.  Case reviewed with full treatment team.  Clearly the Abilify as I predicted is not enough to control his mental health issues.  Patient eventually needed to be isolated on the back call because of his inability to control his disruptiveness. Principal Problem: Schizoaffective disorder, bipolar type (HCC) Diagnosis: Principal Problem:   Schizoaffective disorder, bipolar type (HCC) Active Problems:   Noncompliance  Total Time spent with patient: 30 minutes  Past Psychiatric History: Past history of severe bipolar disorder or schizoaffective disorder with lengthy hospitalizations  Past Medical History:  Past Medical History:  Diagnosis Date  . Bipolar 1 disorder (HCC)   . Schizoaffective disorder (HCC)    History reviewed. No pertinent surgical history. Family History: History reviewed. No pertinent family history. Family Psychiatric  History: See previous Social History:  Social History   Substance and Sexual Activity  Alcohol Use Not on file     Social History   Substance and Sexual Activity  Drug Use Not on file    Social History   Socioeconomic History  . Marital status: Single    Spouse name: Not on file  . Number of children: Not on file  . Years of education: Not on file  . Highest education level: Not on file  Occupational History  . Not on file  Social Needs  . Financial resource strain: Not on file  . Food insecurity:    Worry: Not on file    Inability: Not on file  . Transportation needs:    Medical: Not on file    Non-medical: Not on file  Tobacco Use  . Smoking status: Light Tobacco Smoker     Packs/day: 0.25    Types: Cigarettes  . Smokeless tobacco: Never Used  Substance and Sexual Activity  . Alcohol use: Not on file  . Drug use: Not on file  . Sexual activity: Not on file  Lifestyle  . Physical activity:    Days per week: Not on file    Minutes per session: Not on file  . Stress: Not on file  Relationships  . Social connections:    Talks on phone: Not on file    Gets together: Not on file    Attends religious service: Not on file    Active member of club or organization: Not on file    Attends meetings of clubs or organizations: Not on file    Relationship status: Not on file  Other Topics Concern  . Not on file  Social History Narrative  . Not on file   Additional Social History:                         Sleep: Negative  Appetite:  Negative  Current Medications: Current Facility-Administered Medications  Medication Dose Route Frequency Provider Last Rate Last Dose  . acetaminophen (TYLENOL) tablet 650 mg  650 mg Oral Q6H PRN [name], MD   650 mg at 10/26/18 2302  . alum & mag hydroxide-simeth (MAALOX/MYLANTA) 200-200-20 MG/5ML suspension 30 mL  30 mL Oral Q4H PRN [name], MD   30 mL at 10/26/18 2129  . ARIPiprazole (ABILIFY)  tablet 15 mg  15 mg Oral Daily [name], [name], MD   15 mg at 10/28/18 0810  . divalproex (DEPAKOTE) DR tablet 500 mg  500 mg Oral Q12H [name], [name], MD   500 mg at 10/28/18 0918  . hydrOXYzine (ATARAX/VISTARIL) tablet 50 mg  50 mg Oral Q4H PRN [name], MD   50 mg at 10/28/18 1941  . LORazepam (ATIVAN) injection 2 mg  2 mg Intramuscular BID PRN [name], [name], MD      . magnesium hydroxide (MILK OF MAGNESIA) suspension 30 mL  30 mL Oral Daily PRN [name], MD      . OLANZapine [facility]) injection 10 mg  10 mg Intramuscular Q6H PRN [name], [name], MD      . OLANZapine (ZYPREXA) injection 15 mg  15 mg Intramuscular BID PRN [name], [name], MD   15 mg at 10/20/18 1539  .  OLANZapine (ZYPREXA) tablet 15 mg  15 mg Oral BID [name], [name], MD   15 mg at 10/28/18 7408  . traZODone (DESYREL) tablet 100 mg  100 mg Oral QHS PRN [name], MD   100 mg at 10/26/18 0034    Lab Results: No results found for this or any previous visit (from the past 48 hour(s)).  Blood Alcohol level:  Lab Results  Component Value Date   ETH <10 10/16/2018    Metabolic Disorder Labs: Lab Results  Component Value Date   HGBA1C 5.2 10/23/2018   MPG 102.54 10/23/2018   No results found for: PROLACTIN Lab Results  Component Value Date   CHOL 129 10/23/2018   TRIG 58 10/23/2018   HDL 50 10/23/2018   CHOLHDL 2.6 10/23/2018   VLDL 12 10/23/2018   LDLCALC 67 10/23/2018    Physical Findings: AIMS: Facial and Oral Movements Muscles of Facial Expression: None, normal Lips and Perioral Area: None, normal Jaw: None, normal Tongue: None, normal,Extremity Movements Upper (arms, wrists, hands, fingers): None, normal Lower (legs, knees, ankles, toes): None, normal, Trunk Movements Neck, shoulders, hips: None, normal, Overall Severity Severity of abnormal movements (highest score from questions above): None, normal Incapacitation due to abnormal movements: None, normal Patient's awareness of abnormal movements (rate only patient's report): No Awareness, Dental Status Current problems with teeth and/or dentures?: No Does patient usually wear dentures?: No  CIWA:  CIWA-Ar Total: 0 COWS:  COWS Total Score: 0  Musculoskeletal: Strength & Muscle Tone: within normal limits Gait & Station: normal Patient leans: N/A  Psychiatric Specialty Exam: Physical Exam  Nursing note and vitals reviewed. Constitutional: He appears well-developed and well-nourished.  HENT:  Head: Normocephalic and atraumatic.  Eyes: Pupils are equal, round, and reactive to light. Conjunctivae are normal.  Neck: Normal range of motion.  Cardiovascular: Regular rhythm and normal heart sounds.   Respiratory: Effort normal. No respiratory distress.  GI: Soft.  Musculoskeletal: Normal range of motion.  Neurological: He is alert.  Skin: Skin is warm and dry.  Psychiatric: His affect is labile and inappropriate. His speech is rapid and/or pressured. He is agitated and aggressive. Thought content is paranoid and delusional. Cognition and memory are impaired. He expresses impulsivity and inappropriate judgment.    Review of Systems  Constitutional: Negative.   HENT: Negative.   Eyes: Negative.   Respiratory: Negative.   Cardiovascular: Negative.   Gastrointestinal: Negative.   Musculoskeletal: Negative.   Skin: Negative.   Neurological: Negative.   Psychiatric/Behavioral: Positive for memory loss. Negative for depression, hallucinations, substance abuse  and suicidal ideas. The patient is nervous/anxious and has insomnia.     Blood pressure 101/68, pulse 70, temperature 98.8 F (37.1 C), temperature source Oral, resp. rate 18, height  (1.803 m), weight 88.5 kg, SpO2 99 %.Body mass index is 27.2 kg/m.  General Appearance: Casual  Eye Contact:  Fair  Speech:  Pressured  Volume:  Increased  Mood:  Angry, Euphoric and Irritable  Affect:  Inappropriate and Labile  Thought Process:  Disorganized  Orientation:  Full (Time, Place, and Person)  Thought Content:  Illogical, Paranoid Ideation, Rumination and Tangential  Suicidal Thoughts:  No  Homicidal Thoughts:  No  Memory:  Immediate;   Fair Recent;   Poor Remote;   Poor  Judgement:  Impaired  Insight:  Shallow  Psychomotor Activity:  Increased and Restlessness  Concentration:  Concentration: Poor  Recall:  Poor  Fund of Knowledge:  Poor  Language:  Poor  Akathisia:  No  Handed:  Right  AIMS (if indicated):     Assets:  Architect Social Support  ADL's:  Impaired  Cognition:  Impaired,  Mild  Sleep:  Number of Hours: 2     Treatment Plan Summary: Daily contact with  patient to assess and evaluate symptoms and progress in treatment, Medication management and Plan Spoke with the patient today about appropriate treatment options.  Patient really is not going to do well with just Abilify.  Recommend putting him back on the Depakote and Zyprexa.  Patient not able to engage in rational conversation about it.  Orders restarted and case reviewed with nursing.  Patient still is on the list for referral to [facility] although if he were to regain the ability and motivation to agreed to placement and his mother was agreeable as well we could consider that.  Mordecai Rasmussen, MD 10/28/2018, 4:10 PM

## 2018-10-28 NOTE — Progress Notes (Signed)
1900 Pt. Observed at this time in his room with 1:1 present for safety. No distress noted.

## 2018-10-28 NOTE — Progress Notes (Signed)
Pastoral Care Visit    10/28/18 1300  Clinical Encounter Type  Visited With Patient  Visit Type Follow-up;Psychological support;Spiritual support;Behavioral Health  Referral From Patient  Consult/Referral To Chaplain  Spiritual Encounters  Spiritual Needs Emotional  Stress Factors  Patient Stress Factors Loss of control   Pt was sleepy and incoherent when chap first arrived.  Pt sat up, then got up, and began conversing with greater clarity.  Pt was quite agitated by his isolation and desires to be free.  Pt shared with the chap his views about the faith particle and use of an app which, similar to [place], is able to help one feel things and hear things in unusual ways. Pt shared w/ chap about a book he wrote and two apps he has developed and when checked, they were indeed found online and attributed the pt for creation.  Pt is creative and intelligent.  He has a great deal of Bible knowledge though he struggles with how to use in appropriate ways.  He often spiritualizes texts to prove his point. Pt has definite struggle today with grandiosity and fits of rage.  Milinda Antis, [address]

## 2018-10-28 NOTE — Progress Notes (Signed)
Recreation Therapy Notes  Date: 10/28/2018  Time: 9:30 am   Location: Craft room   Behavioral response: N/A   Intervention Topic: Relaxation  Discussion/Intervention: Patient did not attend group.   Clinical Observations/Feedback:  Patient did not attend group.   Alorah Mcree LRT/CTRS         Christmas Faraci 10/28/2018 10:33 AM

## 2018-10-28 NOTE — Progress Notes (Signed)
Patient refused to take his PM meds. Stating I don't want it. 1:1 is in progress for safety of staffs and patients , patient is loud argumentative and verbally threatening. Safety is maintained

## 2018-10-28 NOTE — Plan of Care (Signed)
Pt. Has been placed on 1:1 observations for safety and security of the patient and the unit for posturing behaviors, agitations, and destructive behaviors. Pt. Given patient education from this writer and the attending, but patient insight and retention of education provided is not evident. Pt. Insight continues to present very poor. Pt. Today demonstrates poor ability to verbalize frustrations as evidence of behaviors noted and observed by this Clinical research associate and staff. Pt. Self control remains poor. Pt. Today is negative frequently in verbalizing his feelings. Pt. Observed visibly stressed and not able to identify stress relieving activities.    Problem: Safety: Goal: Periods of time without injury will increase Outcome: Progressing   Problem: Education: Goal: Knowledge of [place] [facility] Education information/materials will improve Outcome: Not Progressing   Problem: Coping: Goal: Ability to verbalize frustrations and anger appropriately will improve Outcome: Not Progressing Goal: Ability to demonstrate self-control will improve Outcome: Not Progressing   Problem: Education: Goal: Will be free of psychotic symptoms Outcome: Not Progressing Goal: Knowledge of the prescribed therapeutic regimen will improve Outcome: Not Progressing   Problem: Self-Concept: Goal: Will verbalize positive feelings about self Outcome: Not Progressing   Problem: Education: Goal: Ability to state activities that reduce stress will improve Outcome: Not Progressing

## 2018-10-29 MED ORDER — OLANZAPINE 5 MG PO TABS
15.0000 mg | ORAL_TABLET | Freq: Two times a day (BID) | ORAL | Status: DC
  Administered 2018-10-30 – 2018-11-12 (×27): 15 mg via ORAL
  Filled 2018-10-29 (×26): qty 1

## 2018-10-29 NOTE — Progress Notes (Signed)
Patient is in bed with eyes closed resting with any distress 1:1 is continued for safety.

## 2018-10-29 NOTE — Plan of Care (Signed)
D- Patient alert and oriented. Patient presents in an angry/irritable mood on assessment stating that he slept all the way through the night" complaining about the medications he is taking. Patient is going back and forth about not wanting to take any medications, but then states that he will take the oral meds. Patient denies any signs/symptoms of depression and anxiety to this Clinical research associate. Patient also denies SI, HI, AVH, and pain at this time. Patient has no stated goals for today.  A- Scheduled medications administered to patient, per MD orders. Support and encouragement provided.  Routine safety checks conducted every 15 minutes.  Patient informed to notify staff with problems or concerns.  R- No adverse drug reactions noted. Patient contracts for safety at this time. Patient compliant with medications and treatment plan. Patient receptive, calm, and cooperative. Patient interacts well with others on the unit.  Patient remains safe at this time.   Problem: Education: Goal: Knowledge of [place] [facility] Education information/materials will improve Outcome: Progressing   Problem: Coping: Goal: Ability to verbalize frustrations and anger appropriately will improve Outcome: Progressing Goal: Ability to demonstrate self-control will improve Outcome: Progressing   Problem: Safety: Goal: Periods of time without injury will increase Outcome: Progressing   Problem: Education: Goal: Will be free of psychotic symptoms Outcome: Progressing Goal: Knowledge of the prescribed therapeutic regimen will improve Outcome: Progressing   Problem: Self-Concept: Goal: Will verbalize positive feelings about self Outcome: Progressing   Problem: Education: Goal: Ability to state activities that reduce stress will improve Outcome: Progressing

## 2018-10-29 NOTE — Progress Notes (Signed)
Patient is still sleeping and this writer is going to wait until he wakes up to administer morning medication. MD has been notified.

## 2018-10-29 NOTE — Progress Notes (Signed)
Patient just took his morning medication from this writer and stated that the medication is making his body feel weird and "I'm going to sleep for two months, but he doesn't give a fuck". Patient also states that "I'm bleeding inside my stomach, I'm spitting up blood, I told him I'm allergic to this medicine, my body doesn't do well on medication". This Clinical research associate will notify MD.

## 2018-10-29 NOTE — Progress Notes (Signed)
Resting in bed with eyes closed without any distress 1&1 continued for safety.

## 2018-10-29 NOTE — Progress Notes (Signed)
Patient is continued on 1:1 for safety, patient is in bed not sleeping eyes is open not saying anything but aware that he is been monitored.

## 2018-10-29 NOTE — Progress Notes (Signed)
Patient is awake at this time and expressing that he can't breath ,  VS pulse 75, BP,89/71 02sats100%, fluid is offered Gatorade is offered and accepted as a boost.

## 2018-10-29 NOTE — Progress Notes (Signed)
1:1 Patient Hourly Rounding  1100: Patient is back and forth between his room and the hallway talking with his assigned safety sitter present.  1500: Patient is outside with staff and the other members on the unit, with his assigned safety sitter present.  1900: Patient is in the dayroom, reading his bible, with his assigned safety sitter present.

## 2018-10-29 NOTE — [facility] Counselor (Signed)
CSW attempted to check on the patient to see if patient was in need of any additional support.  Patient was observed to be agitated and aggressive as evidenced by yelling and use of profanity.  Patient appeared upset at discovering that CSW was a Child psychotherapist and claims that he did not know this previously.  CSW pointed out that she informed patient of this at first encounter.  Patient began to review his patient's rights and CSW attempted to explain that at this time the patient has a legal guardian who makes medical choices.   Penni Homans, MSW, LCSW 10/29/2018 10:48 AM

## 2018-10-29 NOTE — Progress Notes (Signed)
[facility] MD Progress Note  10/29/2018 4:35 PM Jackson [name]  MRN:  [id number] Subjective: Follow-up for this man with bipolar disorder.  Patient was restarted on stronger medicine yesterday.  He today is much calmer.  He is awake and able to participate in the interview.  Looks a little sluggish.  Still poor insight and irritable towards me but not showing euphoria or flight of ideas.  Patient made some illogical somatic complaints such as feeling like "Depakote makes my heart stop" which are clearly not true.  He did request to come out of the back hallway which seems like an appropriate plan and we discussed this among nursing and myself Principal Problem: Schizoaffective disorder, bipolar type (HCC) Diagnosis: Principal Problem:   Schizoaffective disorder, bipolar type (HCC) Active Problems:   Noncompliance  Total Time spent with patient: 30 minutes  Past Psychiatric History: Patient has a history of bipolar disorder with extended hospital stays in the past  Past Medical History:  Past Medical History:  Diagnosis Date  . Bipolar 1 disorder (HCC)   . Schizoaffective disorder (HCC)    History reviewed. No pertinent surgical history. Family History: History reviewed. No pertinent family history. Family Psychiatric  History: See previous Social History:  Social History   Substance and Sexual Activity  Alcohol Use Not on file     Social History   Substance and Sexual Activity  Drug Use Not on file    Social History   Socioeconomic History  . Marital status: Single    Spouse name: Not on file  . Number of children: Not on file  . Years of education: Not on file  . Highest education level: Not on file  Occupational History  . Not on file  Social Needs  . Financial resource strain: Not on file  . Food insecurity:    Worry: Not on file    Inability: Not on file  . Transportation needs:    Medical: Not on file    Non-medical: Not on file  Tobacco Use  . Smoking status:  Light Tobacco Smoker    Packs/day: 0.25    Types: Cigarettes  . Smokeless tobacco: Never Used  Substance and Sexual Activity  . Alcohol use: Not on file  . Drug use: Not on file  . Sexual activity: Not on file  Lifestyle  . Physical activity:    Days per week: Not on file    Minutes per session: Not on file  . Stress: Not on file  Relationships  . Social connections:    Talks on phone: Not on file    Gets together: Not on file    Attends religious service: Not on file    Active member of club or organization: Not on file    Attends meetings of clubs or organizations: Not on file    Relationship status: Not on file  Other Topics Concern  . Not on file  Social History Narrative  . Not on file   Additional Social History:                         Sleep: Fair  Appetite:  Fair  Current Medications: Current Facility-Administered Medications  Medication Dose Route Frequency Provider Last Rate Last Dose  . acetaminophen (TYLENOL) tablet 650 mg  650 mg Oral Q6H PRN [name], MD   650 mg at 10/26/18 2302  . alum & mag hydroxide-simeth (MAALOX/MYLANTA) 200-200-20 MG/5ML suspension 30 mL  30 mL Oral  Q4H PRN [name], MD   30 mL at 10/26/18 2129  . ARIPiprazole (ABILIFY) tablet 15 mg  15 mg Oral Daily Curt Oatis T, MD   15 mg at 10/29/18 1059  . divalproex (DEPAKOTE) DR tablet 500 mg  500 mg Oral Q12H Alleigh Mollica T, MD   500 mg at 10/29/18 1058  . hydrOXYzine (ATARAX/VISTARIL) tablet 50 mg  50 mg Oral Q4H PRN [name], MD   50 mg at 10/28/18 1638  . LORazepam (ATIVAN) injection 2 mg  2 mg Intramuscular BID PRN Lawrnce Reyez T, MD      . magnesium hydroxide (MILK OF MAGNESIA) suspension 30 mL  30 mL Oral Daily PRN [name], MD      . OLANZapine [facility]) injection 10 mg  10 mg Intramuscular Q6H PRN Volanda Mangine T, MD      . OLANZapine (ZYPREXA) injection 15 mg  15 mg Intramuscular BID PRN Evonne Rinks, [name], MD   15 mg at 10/20/18  1539  . OLANZapine (ZYPREXA) tablet 15 mg  15 mg Oral BID Aavya Shafer, [name], MD   15 mg at 10/29/18 1059  . traZODone (DESYREL) tablet 100 mg  100 mg Oral QHS PRN [name], MD   100 mg at 10/26/18 0034    Lab Results: No results found for this or any previous visit (from the past 48 hour(s)).  Blood Alcohol level:  Lab Results  Component Value Date   ETH <10 10/16/2018    Metabolic Disorder Labs: Lab Results  Component Value Date   HGBA1C 5.2 10/23/2018   MPG 102.54 10/23/2018   No results found for: PROLACTIN Lab Results  Component Value Date   CHOL 129 10/23/2018   TRIG 58 10/23/2018   HDL 50 10/23/2018   CHOLHDL 2.6 10/23/2018   VLDL 12 10/23/2018   LDLCALC 67 10/23/2018    Physical Findings: AIMS: Facial and Oral Movements Muscles of Facial Expression: None, normal Lips and Perioral Area: None, normal Jaw: None, normal Tongue: None, normal,Extremity Movements Upper (arms, wrists, hands, fingers): None, normal Lower (legs, knees, ankles, toes): None, normal, Trunk Movements Neck, shoulders, hips: None, normal, Overall Severity Severity of abnormal movements (highest score from questions above): None, normal Incapacitation due to abnormal movements: None, normal Patient's awareness of abnormal movements (rate only patient's report): No Awareness, Dental Status Current problems with teeth and/or dentures?: No Does patient usually wear dentures?: No  CIWA:  CIWA-Ar Total: 0 COWS:  COWS Total Score: 0  Musculoskeletal: Strength & Muscle Tone: within normal limits Gait & Station: normal Patient leans: N/A  Psychiatric Specialty Exam: Physical Exam  Nursing note and vitals reviewed. Constitutional: He appears well-developed and well-nourished.  HENT:  Head: Normocephalic and atraumatic.  Eyes: Pupils are equal, round, and reactive to light. Conjunctivae are normal.  Neck: Normal range of motion.  Cardiovascular: Regular rhythm and normal heart sounds.   Respiratory: Effort normal. No respiratory distress.  GI: Soft.  Musculoskeletal: Normal range of motion.  Neurological: He is alert.  Skin: Skin is warm and dry.  Psychiatric: His affect is blunt. His speech is delayed. He is slowed. Thought content is paranoid and delusional. Cognition and memory are impaired. He expresses inappropriate judgment. He expresses no homicidal and no suicidal ideation.    Review of Systems  Constitutional: Positive for malaise/fatigue.  HENT: Negative.   Eyes: Negative.   Respiratory: Negative.   Cardiovascular: Negative.   Gastrointestinal: Negative.   Musculoskeletal: Negative.   Skin:  Negative.   Neurological: Negative.   Psychiatric/Behavioral: Negative for depression, hallucinations, memory loss, substance abuse and suicidal ideas. The patient is not nervous/anxious and does not have insomnia.     Blood pressure 104/64, pulse 75, temperature 98.2 F (36.8 C), temperature source Oral, resp. rate 18, height 5\' 11"  (1.803 m), weight 88.5 kg, SpO2 100 %.Body mass index is 27.2 kg/m.  General Appearance: Disheveled  Eye Contact:  Fair  Speech:  Slow  Volume:  Decreased  Mood:  Irritable  Affect:  Constricted  Thought Process:  Disorganized  Orientation:  Full (Time, Place, and Person)  Thought Content:  Paranoid Ideation and Tangential  Suicidal Thoughts:  No  Homicidal Thoughts:  No  Memory:  Immediate;   Fair Recent;   Poor Remote;   Fair  Judgement:  Impaired  Insight:  Shallow  Psychomotor Activity:  Decreased  Concentration:  Concentration: Fair  Recall:  [employer] of Knowledge:  Fair  Language:  Fair  Akathisia:  No  Handed:  Right  AIMS (if indicated):     Assets:  Communication Skills Desire for Improvement Physical Health Resilience Social Support  ADL's:  Impaired  Cognition:  Impaired,  Mild  Sleep:  Number of Hours: 3     Treatment Plan Summary: Daily contact with patient to assess and evaluate symptoms and  progress in treatment, Medication management and Plan Still still not sleeping much but certainly much calmer.  We were agreeable to letting him out of the back hallway as long as his behavior does not escalate to where it is disruptive.  No change to current plan for Depakote and olanzapine with forced olanzapine as a backup.  One-to-one at the discretion of nursing.  No other changes to medication.  Patient is on the [facility].  Mordecai Rasmussen, MD 10/29/2018, 4:35 PM

## 2018-10-29 NOTE — [facility] Group Notes (Signed)
Feelings Around Relapse 10/29/2018 1PM  Type of Therapy and Topic:  Group Therapy:  Feelings around Relapse and Recovery  Participation Level:  Did Not Attend   Description of Group:    Patients in this group will discuss emotions they experience before and after a relapse. They will process how experiencing these feelings, or avoidance of experiencing them, relates to having a relapse. Facilitator will guide patients to explore emotions they have related to recovery. Patients will be encouraged to process which emotions are more powerful. They will be guided to discuss the emotional reaction significant others in their lives may have to patients' relapse or recovery. Patients will be assisted in exploring ways to respond to the emotions of others without this contributing to a relapse.  Therapeutic Goals: 1. Patient will identify two or more emotions that lead to a relapse for them 2. Patient will identify two emotions that result when they relapse 3. Patient will identify two emotions related to recovery 4. Patient will demonstrate ability to communicate their needs through discussion and/or role plays   Summary of Patient Progress:     Therapeutic Modalities:   Cognitive Behavioral Therapy Solution-Focused Therapy Assertiveness Training Relapse Prevention Therapy   Suzan Slick, LCSW 10/29/2018 2:13 PM

## 2018-10-30 NOTE — Plan of Care (Signed)
Patient is alert and oriented X 3, very labile and animated. Patient is currently on 1:1 with a safety sitter due to previous aggressive like behaviors. Patient did agree to take medication this morning. Patient is loud when talking and blames others for his behaviors and reason for being hospitalized. Patient denies SI, HI and AVH. Patient ate breakfast this morning, took medications and currently in dayroom with safety sitter watching television and interacting with peers and sitter. No self harming behaviors this morning thus far. Safety sitter present. Problem: Education: Goal: Knowledge of [facility] Education information/materials will improve Outcome: Not Progressing   Problem: Coping: Goal: Ability to verbalize frustrations and anger appropriately will improve Outcome: Not Progressing Goal: Ability to demonstrate self-control will improve Outcome: Not Progressing   Problem: Safety: Goal: Periods of time without injury will increase Outcome: Not Progressing

## 2018-10-30 NOTE — [facility] Group Notes (Signed)
LCSW Group Therapy Note  10/30/2018 1:15pm  Type of Therapy and Topic:  Group Therapy:  Cognitive Distortions  Participation Level:  Did Not Attend   Description of Group:    Patients in this group will be introduced to the topic of cognitive distortions.  Patients will identify and describe cognitive distortions, describe the feelings these distortions create for them.  Patients will identify one or more situations in their personal life where they have cognitively distorted thinking and will verbalize challenging this cognitive distortion through positive thinking skills.  Patients will practice the skill of using positive affirmations to challenge cognitive distortions using affirmation cards.    Therapeutic Goals:  1. Patient will identify two or more cognitive distortions they have used 2. Patient will identify one or more emotions that stem from use of a cognitive distortion 3. Patient will demonstrate use of a positive affirmation to counter a cognitive distortion through discussion and/or role play. 4. Patient will describe one way cognitive distortions can be detrimental to wellness   Summary of Patient Progress: Pt was invited to attend group but chose not to attend. CSW will continue to encourage pt to attend group throughout their admission.      Therapeutic Modalities:   Cognitive Behavioral Therapy Motivational Interviewing   Maghan Jessee  [name], LCSW 10/30/2018 10:28 AM    

## 2018-10-30 NOTE — Progress Notes (Signed)
Patient went outside in the court yard and played basketball today with peers and Recruitment consultant. Patient did eat meals and seems to be enjoying having attention from a Recruitment consultant. Patient  Denies pain, although requesting Gatorade due to thirst. RN will continue to monitor. Safety sitter within arms reach.

## 2018-10-30 NOTE — Progress Notes (Addendum)
1900 - Patient is in the day room interacting appropriately with staff and peers and is without concern at this time. Patient is on 1:1 and remains safe on the unit.   2000 - Patient is in his room and remains safe on a 1:1 at this time.   2100 - Patient is in the day room interacting appropriately with staff and peers and is without concern at this time. Patient is on 1:1 and remains safe on the unit.   2200 - Patient is in the day room interacting appropriately with staff and peers and is without concern at this time. Patient is on 1:1 and remains safe on the unit.   2300 - Patient is in his room and remains safe on a 1:1 at this time.   0000 - Patient is asleep in his room and remains safe on a 1:1 at this time  0100 - Patient is asleep in his room and remains safe on a 1:1 at this time  0200 - Patient is asleep in his room and remains safe on a 1:1 at this time  0300 - Patient is asleep in his room and remains safe on a 1:1 at this time  0700 - Patient is asleep in his room and remains safe on a 1:1 at this time

## 2018-10-30 NOTE — Progress Notes (Signed)
Seeing patient in the day room socializing with peers and watching television with out any issues, patient is maintained on 1&1 for safety.
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
## 2018-10-30 NOTE — Plan of Care (Signed)
Patient is irritable and blaming the MD for having him on the wrong dose of medication. Patient was compliant with medication administration this evening.   Problem: Coping: Goal: Ability to verbalize frustrations and anger appropriately will improve Outcome: Not Progressing Goal: Ability to demonstrate self-control will improve Outcome: Not Progressing

## 2018-10-30 NOTE — Progress Notes (Signed)
Patient laying down in bed, eyes closed; breaths even and unlabored. Patient is being monitored by MHT 1:1 for safety.

## 2018-10-31 NOTE — Progress Notes (Signed)
Patient is in his room and in bed with eyes closed no out burst and no violent behaviors noted medication is given and taken with out any issues , patient remain on 1&1 for safety.

## 2018-10-31 NOTE — Progress Notes (Signed)
Patient is stable and in bed with eyes closed with out distress and 1&1 continued  .

## 2018-10-31 NOTE — [facility] Group Notes (Signed)
[facility] Group Notes:  (Nursing/MHT/Case Management/Adjunct)  Date:  10/31/2018  Time:  12:05 AM  Type of Therapy:  Group Therapy  Participation Level:  Active  Participation Quality:  Sharing  Affect:  Appropriate  Cognitive:  Appropriate  Insight:  Good  Engagement in Group:  Engaged  Modes of Intervention:  Support  Summary of Progress/Problems:  Jackson [name] 10/31/2018, 12:05 AM

## 2018-10-31 NOTE — Progress Notes (Signed)
[facility] MD Progress Note  10/31/2018 1:54 PM Jackson [name]  MRN:  [id number] Subjective: Follow-up for this man with bipolar disorder.   Pt seen and chart reviewed. Jackson [name] is calm, cooperative, and pleasant today.  [name] was watching out the window and said that Jackson [name] enjoys looking outside. Jackson [name] did not mention any of the paranoid delusion about his roommate in [facility] today.   Jackson [name] has been compliant with meds, and seems to tolerate it without complaints.   Principal Problem: Schizoaffective disorder, bipolar type (HCC) Diagnosis: Principal Problem:   Schizoaffective disorder, bipolar type (HCC) Active Problems:   Noncompliance  Total Time spent with patient: 20 minutes  Past Psychiatric History: Patient has a history of bipolar disorder with extended hospital stays in the past  Past Medical History:  Past Medical History:  Diagnosis Date  . Bipolar 1 disorder (HCC)   . Schizoaffective disorder (HCC)    History reviewed. No pertinent surgical history. Family History: History reviewed. No pertinent family history. Family Psychiatric  History: See previous Social History:  Social History   Substance and Sexual Activity  Alcohol Use Not on file     Social History   Substance and Sexual Activity  Drug Use Not on file    Social History   Socioeconomic History  . Marital status: Single    Spouse name: Not on file  . Number of children: Not on file  . Years of education: Not on file  . Highest education level: Not on file  Occupational History  . Not on file  Social Needs  . Financial resource strain: Not on file  . Food insecurity:    Worry: Not on file    Inability: Not on file  . Transportation needs:    Medical: Not on file    Non-medical: Not on file  Tobacco Use  . Smoking status: Light Tobacco Smoker    Packs/day: 0.25    Types: Cigarettes  . Smokeless tobacco: Never Used  Substance and Sexual Activity  . Alcohol use: Not on file  . Drug use: Not on file  . Sexual  activity: Not on file  Lifestyle  . Physical activity:    Days per week: Not on file    Minutes per session: Not on file  . Stress: Not on file  Relationships  . Social connections:    Talks on phone: Not on file    Gets together: Not on file    Attends religious service: Not on file    Active member of club or organization: Not on file    Attends meetings of clubs or organizations: Not on file    Relationship status: Not on file  Other Topics Concern  . Not on file  Social History Narrative  . Not on file   Additional Social History:   Sleep: Fair  Appetite:  Fair  Current Medications: Current Facility-Administered Medications  Medication Dose Route Frequency Provider Last Rate Last Dose  . acetaminophen (TYLENOL) tablet 650 mg  650 mg Oral Q6H PRN Jackson Hart, MD   650 mg at 10/26/18 2302  . alum & mag hydroxide-simeth (MAALOX/MYLANTA) 200-200-20 MG/5ML suspension 30 mL  30 mL Oral Q4H PRN Jackson Hart, MD   30 mL at 10/26/18 2129  . ARIPiprazole (ABILIFY) tablet 15 mg  15 mg Oral Daily [name], Jackson T, MD   15 mg at 10/31/18 0900  . divalproex (DEPAKOTE) DR tablet 500 mg  500 mg Oral Q12H [name], Jackson Denmark, MD  500 mg at 10/31/18 0900  . hydrOXYzine (ATARAX/VISTARIL) tablet 50 mg  50 mg Oral Q4H PRN Jackson Hart, MD   50 mg at 10/28/18 7846  . magnesium hydroxide (MILK OF MAGNESIA) suspension 30 mL  30 mL Oral Daily PRN Jackson Hart, MD      . OLANZapine [facility]) injection 10 mg  10 mg Intramuscular Q6H PRN [name], Jackson T, MD      . OLANZapine (ZYPREXA) tablet 15 mg  15 mg Oral Q12H [name], Jackson T, MD   15 mg at 10/31/18 0900  . traZODone (DESYREL) tablet 100 mg  100 mg Oral QHS PRN Jackson Hart, MD   100 mg at 10/26/18 0034    Lab Results: No results found for this or any previous visit (from the past 48 hour(s)).  Blood Alcohol level:  Lab Results  Component Value Date   ETH <10 10/16/2018    Metabolic Disorder Labs: Lab  Results  Component Value Date   HGBA1C 5.2 10/23/2018   MPG 102.54 10/23/2018   No results found for: PROLACTIN Lab Results  Component Value Date   CHOL 129 10/23/2018   TRIG 58 10/23/2018   HDL 50 10/23/2018   CHOLHDL 2.6 10/23/2018   VLDL 12 10/23/2018   LDLCALC 67 10/23/2018    Physical Findings: AIMS: Facial and Oral Movements Muscles of Facial Expression: None, normal Lips and Perioral Area: None, normal Jaw: None, normal Tongue: None, normal,Extremity Movements Upper (arms, wrists, hands, fingers): None, normal Lower (legs, knees, ankles, toes): None, normal, Trunk Movements Neck, shoulders, hips: None, normal, Overall Severity Severity of abnormal movements (highest score from questions above): None, normal Incapacitation due to abnormal movements: None, normal Patient's awareness of abnormal movements (rate only patient's report): No Awareness, Dental Status Current problems with teeth and/or dentures?: No Does patient usually wear dentures?: No  CIWA:  CIWA-Ar Total: 0 COWS:  COWS Total Score: 0  Musculoskeletal: Strength & Muscle Tone: within normal limits Gait & Station: normal Patient leans: N/A  Psychiatric Specialty Exam: Physical Exam  Nursing note and vitals reviewed. Constitutional: Tamerra Merkley appears well-developed and well-nourished.  HENT:  Head: Normocephalic and atraumatic.  Eyes: Pupils are equal, round, and reactive to light. Conjunctivae are normal.  Neck: Normal range of motion.  Cardiovascular: Regular rhythm and normal heart sounds.  Respiratory: Effort normal. No respiratory distress.  GI: Soft.  Musculoskeletal: Normal range of motion.  Neurological: Cruzita Lipa is alert.  Skin: Skin is warm and dry.  Psychiatric: Adasyn Mcadams has a normal mood and affect. His speech is normal and behavior is normal. His affect is not blunt. His speech is not delayed. Jackson [name] is not slowed. Thought content is paranoid and delusional. Cognition and memory are impaired. [name] does not  express inappropriate judgment. [name] expresses no homicidal and no suicidal ideation.    Review of Systems  Constitutional: Positive for malaise/fatigue.  HENT: Negative.   Eyes: Negative.   Respiratory: Negative.   Cardiovascular: Negative.   Gastrointestinal: Negative.   Musculoskeletal: Negative.   Skin: Negative.   Neurological: Negative.   Psychiatric/Behavioral: Negative for depression, hallucinations, memory loss, substance abuse and suicidal ideas. The patient is not nervous/anxious and does not have insomnia.     Blood pressure 102/65, pulse 76, temperature 98.5 F (36.9 C), temperature source Oral, resp. rate 18, height 5\' 11"  (1.803 m), weight 88.5 kg, SpO2 99 %.Body mass index is 27.2 kg/m.  General Appearance: Casual  Eye Contact:  Fair  Speech:  Garbled and Normal  Rate  Volume:  Normal  Mood:  Irritable  Affect:  Constricted  Thought Process:  Disorganized  Orientation:  Full (Time, Place, and Person)  Thought Content:  Paranoid Ideation and Tangential  Suicidal Thoughts:  No  Homicidal Thoughts:  No  Memory:  Immediate;   Fair Recent;   Poor Remote;   Fair  Judgement:  Impaired  Insight:  Shallow  Psychomotor Activity:  Decreased  Concentration:  Concentration: Fair  Recall:  [employer] of Knowledge:  Fair  Language:  Fair  Akathisia:  No  Handed:  Right  AIMS (if indicated):     Assets:  Communication Skills Desire for Improvement Physical Health Resilience Social Support  ADL's:  Impaired  Cognition:  Impaired,  Mild  Sleep:  Number of Hours: 6     Treatment Plan Summary: Daily contact with patient to assess and evaluate symptoms and progress in treatment and Medication management   His behaviors are much improved today with zyprexa, thus, 1:1 sitter is discontinued.    #Schizoaffective disorder -- continue abilify 15mg  daily and zyprexa 15mg  BID, which was just started yesterday. Noelene Gang seems to improve significantly today.  Would consider stop  abilify to avoid 2 concurrent antipsychotics use is if Antuan Limes continues to do well.  -- zyprexa IM as force med -- stopped IM ativan PRN, as combination of IM zyprexa and IM ativan increase risks of hypotension significantly.  Pt has not needed so far anyway.  -- continue depakote 500mg  BID -- QTc is 384 -- continue Trazodone 100mg  qhs prn.   # dispo -- defer to primary team.    Macintyre Alexa, MD 10/31/2018, 1:54 PM

## 2018-10-31 NOTE — Plan of Care (Signed)
Patient refused medication this evening. Patient is preoccupied with religious thoughts.    Problem: Education: Goal: Will be free of psychotic symptoms Outcome: Not Progressing Goal: Knowledge of the prescribed therapeutic regimen will improve Outcome: Not Progressing

## 2018-10-31 NOTE — Progress Notes (Signed)
D - Patient was in the dayroom upon arrival to the unit. Patient was pleasant during assessment. Patient denies SI/HI/AVH, pain, anxiety and depression with this Clinical research associate. Patient refused a medication this evening. Patient stated, "These medications are making me gain weight."   A - Patient was compliant with procedures on the unit but did refuse a medication this evening. Patient given education. Patient given support and encouragement to be active in his treatment plan. Patient informed to let staff know if there are any issues or problems on the unit.   R - Patient being monitored Q 15 minutes for safety per unit protocol. Patient remains safe on the unit.

## 2018-10-31 NOTE — BHH Group Notes (Signed)
LCSW Group Therapy Note 10/31/2018 1:15pm  Type of Therapy and Topic: Group Therapy: Feelings Around Returning Home & Establishing a Supportive Framework and Supporting Oneself When Supports Not Available  Participation Level: Active  Description of Group:  Patients first processed thoughts and feelings about upcoming discharge. These included fears of upcoming changes, lack of change, new living environments, judgements and expectations from others and overall stigma of mental health issues. The group then discussed the definition of a supportive framework, what that looks and feels like, and how do to discern it from an unhealthy non-supportive network. The group identified different types of supports as well as what to do when your family/friends are less than helpful or unavailable  Therapeutic Goals  1. Patient will identify one healthy supportive network that they can use at discharge. 2. Patient will identify one factor of a supportive framework and how to tell it from an unhealthy network. 3. Patient able to identify one coping skill to use when they do not have positive supports from others. 4. Patient will demonstrate ability to communicate their needs through discussion and/or role plays.  Summary of Patient Progress:  The patient reported he feels "good." Pt engaged during group session. As patients processed their anxiety about discharge and described healthy supports patient shared he does not know how to answer that question since he has no say on it.  Patients identified at least one self-care tool they were willing to use after discharge.   Therapeutic Modalities Cognitive Behavioral Therapy Motivational Interviewing   Sherria Riemann  [name], LCSW 10/31/2018 9:07 AM

## 2018-10-31 NOTE — Plan of Care (Signed)
D- Patient alert and oriented. Patient appears in an animated, but very pleasant mood on assessment stating that he slept "terrible" last night, but with a smile on his face as if he was joking. Patient had no major complaints or concerns to voice to this Clinical research associate. Patient denies any signs/symptoms of depression and anxiety to this Clinical research associate. Patient also denies SI, HI, AVH, and pain at this time. Patient's goal for today is to "keep the hope alive".  A- Scheduled medications administered to patient, per MD orders. Support and encouragement provided.  Routine safety checks conducted every 15 minutes.  Patient informed to notify staff with problems or concerns.  R- No adverse drug reactions noted. Patient contracts for safety at this time. Patient compliant with medications and treatment plan. Patient receptive, calm, and cooperative. Patient interacts well with others on the unit.  Patient remains safe at this time.   Problem: Education: Goal: Knowledge of [place] [facility] Education information/materials will improve Outcome: Progressing   Problem: Coping: Goal: Ability to verbalize frustrations and anger appropriately will improve Outcome: Progressing Goal: Ability to demonstrate self-control will improve Outcome: Progressing   Problem: Safety: Goal: Periods of time without injury will increase Outcome: Progressing   Problem: Education: Goal: Will be free of psychotic symptoms Outcome: Progressing Goal: Knowledge of the prescribed therapeutic regimen will improve Outcome: Progressing   Problem: Self-Concept: Goal: Will verbalize positive feelings about self Outcome: Progressing   Problem: Education: Goal: Ability to state activities that reduce stress will improve Outcome: Progressing

## 2018-11-01 NOTE — Tx Team (Signed)
Interdisciplinary Treatment and Diagnostic Plan Update  11/01/2018 Time of Session: 8:30AM  [name] MRN: [id number]  Principal Diagnosis: Schizoaffective disorder, bipolar type [facility])  Secondary Diagnoses: Principal Problem:   Schizoaffective disorder, bipolar type (HCC) Active Problems:   Noncompliance   Current Medications:  Current Facility-Administered Medications  Medication Dose Route Frequency Provider Last Rate Last Dose  . acetaminophen (TYLENOL) tablet 650 mg  650 mg Oral Q6H PRN [name], MD   650 mg at 10/26/18 2302  . alum & mag hydroxide-simeth (MAALOX/MYLANTA) 200-200-20 MG/5ML suspension 30 mL  30 mL Oral Q4H PRN [name], MD   30 mL at 10/26/18 2129  . ARIPiprazole (ABILIFY) tablet 15 mg  15 mg Oral Daily [name], [name], MD   15 mg at 11/01/18 0827  . divalproex (DEPAKOTE) DR tablet 500 mg  500 mg Oral Q12H [name], [name], MD   500 mg at 11/01/18 0827  . hydrOXYzine (ATARAX/VISTARIL) tablet 50 mg  50 mg Oral Q4H PRN [name], MD   50 mg at 10/28/18 7121  . magnesium hydroxide (MILK OF MAGNESIA) suspension 30 mL  30 mL Oral Daily PRN [name], MD      . OLANZapine [facility]) injection 10 mg  10 mg Intramuscular Q6H PRN [name], [name], MD      . OLANZapine (ZYPREXA) tablet 15 mg  15 mg Oral Q12H [name], [name], MD   15 mg at 11/01/18 0827  . traZODone (DESYREL) tablet 100 mg  100 mg Oral QHS PRN [name], MD   100 mg at 10/26/18 0034   PTA Medications: No medications prior to admission.    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   Physician Treatment Plan for Primary Diagnosis: Schizoaffective disorder, bipolar type (HCC) Long Term  Goal(s): Improvement in symptoms so as ready for discharge Improvement in symptoms so as ready for discharge   Short Term Goals: Ability to demonstrate self-control will improve Ability to identify and develop effective coping behaviors will improve Compliance with prescribed medications will improve  Medication Management: Evaluate patient's response, side effects, and tolerance of medication regimen.  Therapeutic Interventions: 1 to 1 [place], Unit Group [place] and Medication administration.  Evaluation of Outcomes: Progressing  Physician Treatment Plan for Secondary Diagnosis: Principal Problem:   Schizoaffective disorder, bipolar type (HCC) Active Problems:   Noncompliance  Long Term Goal(s): Improvement in symptoms so as ready for discharge Improvement in symptoms so as ready for discharge   Short Term Goals: Ability to demonstrate self-control will improve Ability to identify and develop effective coping behaviors will improve Compliance with prescribed medications will improve     Medication Management: Evaluate patient's response, side effects, and tolerance of medication regimen.  Therapeutic Interventions: 1 to 1 [name], Unit Group [place] and Medication administration.  Evaluation of Outcomes: Progressing   RN Treatment Plan for Primary Diagnosis: Schizoaffective disorder, bipolar type (HCC) Long Term Goal(s): Knowledge of disease and therapeutic regimen to maintain health will improve  Short Term Goals: Ability to verbalize frustration and anger appropriately will improve, Ability to demonstrate self-control, Ability to participate in decision making will improve and Ability to verbalize feelings will improve  Medication Management: RN will administer medications as ordered by provider, will assess and evaluate patient's response and provide education to patient for prescribed  medication. RN will report any adverse and/or side effects to prescribing  provider.  Therapeutic Interventions: 1 on 1 counseling General, Psychoeducation, Medication administration, Evaluate responses to treatment, Monitor vital signs and CBGs as ordered, Perform/monitor CIWA, COWS, AIMS and Fall Risk screenings as ordered, Perform wound care treatments as ordered.  Evaluation of Outcomes: Progressing   LCSW Treatment Plan for Primary Diagnosis: Schizoaffective disorder, bipolar type (HCC) Long Term Goal(s): Safe transition to appropriate next level of care at discharge, Engage patient in therapeutic group addressing interpersonal concerns.  Short Term Goals: Engage patient in aftercare planning with referrals and resources, Increase social support, Increase ability to appropriately verbalize feelings, Increase emotional regulation and Facilitate acceptance of mental health diagnosis and concerns  Therapeutic Interventions: Assess for all discharge needs, 1 to 1 time with Social worker, Explore available resources and support systems, Assess for adequacy in community support network, Educate family and significant other(s) on suicide prevention, Complete Psychosocial Assessment, Interpersonal group therapy.  Evaluation of Outcomes: Progressing   Progress in Treatment: Attending groups: Yes. Participating in groups: Yes. Taking medication as prescribed: Yes.  Patient was placed on forced med order. Toleration medication: Yes. Family/Significant other contact made: Yes, individual(s) contacted:  CSW completed SPE with the patients mother.  Patient understands diagnosis: Yes. Discussing patient identified problems/goals with staff: Yes. Medical problems stabilized or resolved: Yes. Denies suicidal/homicidal ideation: Yes. Issues/concerns per patient self-inventory: No. Other: none  New problem(s) identified: No, Describe:  none  New Short Term/Long Term Goal(s): elimination of symptoms of psychosis, medication management for mood stabilization; elimination  of SI thoughts; development of comprehensive mental wellness plan.  Patient Goals:  "independence"  Discharge Plan or Barriers: Pt has a legal guardian, his mother. Patient behaviors are inconsistent at this time.  Patient has moments of appropriate behaviors then engages in possible attention seeking behaviors.  Patient can be loud and hyper-religious.  [facility] and remains on the waitlist.   Reason for Continuation of Hospitalization: Aggression Delusions  Hallucinations Mania Medication stabilization  Estimated Length of Stay: 1-5 days  Recreational Therapy: Patient Stressors: N/A Patient Goal: Patient will focus on task/topic with 2 prompts from staff within 5 recreation therapy group [place]  Attendees: Patient: Jackson [name] 11/01/2018 9:19 AM  Physician: Dr. Toni Amend, MD 11/01/2018 9:19 AM  Nursing:  11/01/2018 9:19 AM  RN Care Manager: 11/01/2018 9:19 AM  Social Worker: Penni Homans, MSW, LCSW 11/01/2018 9:19 AM  Recreational Therapist:  11/01/2018 9:19 AM  Other:  11/01/2018 9:19 AM  Other:  11/01/2018 9:19 AM  Other: 11/01/2018 9:19 AM    Scribe for Treatment Team: Harden Mo, LCSW 11/01/2018 9:19 AM

## 2018-11-01 NOTE — Plan of Care (Signed)
D- Patient alert and oriented. Patient presents in a hyperactive mood on assessment stating that he slept "wonderful, because I was time-traveling". Patient had complaints of "I've gained 40 pounds since I've been here". When asked about any hallucinations, patient stated "I know it's real". Patient denies SI, HI, and pain at this time. Patient denies any signs/symptoms of depression/anxiety stating to this writer "no, I'm happy". Patient didn't have any stated goals for today.  A- Scheduled medications administered to patient, per MD orders. Support and encouragement provided.  Routine safety checks conducted every 15 minutes.  Patient informed to notify staff with problems or concerns.  R- No adverse drug reactions noted. Patient contracts for safety at this time. Patient compliant with medications and treatment plan. Patient receptive, calm, and cooperative. Patient interacts well with others on the unit.  Patient remains safe at this time.   Problem: Education: Goal: Knowledge of [place] [facility] Education information/materials will improve Outcome: Progressing   Problem: Coping: Goal: Ability to verbalize frustrations and anger appropriately will improve Outcome: Progressing Goal: Ability to demonstrate self-control will improve Outcome: Progressing   Problem: Safety: Goal: Periods of time without injury will increase Outcome: Progressing   Problem: Education: Goal: Will be free of psychotic symptoms Outcome: Progressing Goal: Knowledge of the prescribed therapeutic regimen will improve Outcome: Progressing   Problem: Self-Concept: Goal: Will verbalize positive feelings about self Outcome: Progressing   Problem: Education: Goal: Ability to state activities that reduce stress will improve Outcome: Progressing

## 2018-11-01 NOTE — Progress Notes (Signed)
[facility] MD Progress Note  11/01/2018 5:15 PM Jackson [name]  MRN:  [id number] Subjective: Follow-up for this patient with schizoaffective disorder.  Still hyperactive hyperverbal and intrusive.  He is claiming to me today that he gained 40 pounds in the last week which is very clearly not true.  Overall appears to be tolerating medicine okay calming down a little although still not with any insight. Principal Problem: Schizoaffective disorder, bipolar type (HCC) Diagnosis: Principal Problem:   Schizoaffective disorder, bipolar type (HCC) Active Problems:   Noncompliance  Total Time spent with patient: 30 minutes  Past Psychiatric History: Long history of bipolar disorder  Past Medical History:  Past Medical History:  Diagnosis Date  . Bipolar 1 disorder (HCC)   . Schizoaffective disorder (HCC)    History reviewed. No pertinent surgical history. Family History: History reviewed. No pertinent family history. Family Psychiatric  History: See previous Social History:  Social History   Substance and Sexual Activity  Alcohol Use Not on file     Social History   Substance and Sexual Activity  Drug Use Not on file    Social History   Socioeconomic History  . Marital status: Single    Spouse name: Not on file  . Number of children: Not on file  . Years of education: Not on file  . Highest education level: Not on file  Occupational History  . Not on file  Social Needs  . Financial resource strain: Not on file  . Food insecurity:    Worry: Not on file    Inability: Not on file  . Transportation needs:    Medical: Not on file    Non-medical: Not on file  Tobacco Use  . Smoking status: Light Tobacco Smoker    Packs/day: 0.25    Types: Cigarettes  . Smokeless tobacco: Never Used  Substance and Sexual Activity  . Alcohol use: Not on file  . Drug use: Not on file  . Sexual activity: Not on file  Lifestyle  . Physical activity:    Days per week: Not on file    Minutes per  session: Not on file  . Stress: Not on file  Relationships  . Social connections:    Talks on phone: Not on file    Gets together: Not on file    Attends religious service: Not on file    Active member of club or organization: Not on file    Attends meetings of clubs or organizations: Not on file    Relationship status: Not on file  Other Topics Concern  . Not on file  Social History Narrative  . Not on file   Additional Social History:                         Sleep: Poor  Appetite:  Fair  Current Medications: Current Facility-Administered Medications  Medication Dose Route Frequency Provider Last Rate Last Dose  . acetaminophen (TYLENOL) tablet 650 mg  650 mg Oral Q6H PRN [name], MD   650 mg at 10/26/18 2302  . alum & mag hydroxide-simeth (MAALOX/MYLANTA) 200-200-20 MG/5ML suspension 30 mL  30 mL Oral Q4H PRN [name], MD   30 mL at 10/26/18 2129  . ARIPiprazole (ABILIFY) tablet 15 mg  15 mg Oral Daily [name], [name], MD   15 mg at 11/01/18 0827  . divalproex (DEPAKOTE) DR tablet 500 mg  500 mg Oral Q12H [name], [name], MD  500 mg at 11/01/18 0827  . hydrOXYzine (ATARAX/VISTARIL) tablet 50 mg  50 mg Oral Q4H PRN [name], MD   50 mg at 10/28/18 2956  . magnesium hydroxide (MILK OF MAGNESIA) suspension 30 mL  30 mL Oral Daily PRN [name], MD      . OLANZapine [facility]) injection 10 mg  10 mg Intramuscular Q6H PRN [name], [name], MD      . OLANZapine (ZYPREXA) tablet 15 mg  15 mg Oral Q12H [name], [name], MD   15 mg at 11/01/18 0827  . traZODone (DESYREL) tablet 100 mg  100 mg Oral QHS PRN [name], MD   100 mg at 10/26/18 0034    Lab Results: No results found for this or any previous visit (from the past 48 hour(s)).  Blood Alcohol level:  Lab Results  Component Value Date   ETH <10 10/16/2018    Metabolic Disorder Labs: Lab Results  Component Value Date   HGBA1C 5.2 10/23/2018   MPG 102.54  10/23/2018   No results found for: PROLACTIN Lab Results  Component Value Date   CHOL 129 10/23/2018   TRIG 58 10/23/2018   HDL 50 10/23/2018   CHOLHDL 2.6 10/23/2018   VLDL 12 10/23/2018   LDLCALC 67 10/23/2018    Physical Findings: AIMS: Facial and Oral Movements Muscles of Facial Expression: None, normal Lips and Perioral Area: None, normal Jaw: None, normal Tongue: None, normal,Extremity Movements Upper (arms, wrists, hands, fingers): None, normal Lower (legs, knees, ankles, toes): None, normal, Trunk Movements Neck, shoulders, hips: None, normal, Overall Severity Severity of abnormal movements (highest score from questions above): None, normal Incapacitation due to abnormal movements: None, normal Patient's awareness of abnormal movements (rate only patient's report): No Awareness, Dental Status Current problems with teeth and/or dentures?: No Does patient usually wear dentures?: No  CIWA:  CIWA-Ar Total: 0 COWS:  COWS Total Score: 0  Musculoskeletal: Strength & Muscle Tone: within normal limits Gait & Station: normal Patient leans: N/A  Psychiatric Specialty Exam: Physical Exam  Nursing note and vitals reviewed. Constitutional: He appears well-developed and well-nourished.  HENT:  Head: Normocephalic and atraumatic.  Eyes: Pupils are equal, round, and reactive to light. Conjunctivae are normal.  Neck: Normal range of motion.  Cardiovascular: Regular rhythm and normal heart sounds.  Respiratory: Effort normal. No respiratory distress.  GI: Soft.  Musculoskeletal: Normal range of motion.  Neurological: He is alert.  Skin: Skin is warm and dry.  Psychiatric: His mood appears anxious. His affect is labile. His speech is tangential. He is agitated. Thought content is paranoid. He expresses impulsivity. He exhibits abnormal recent memory.    Review of Systems  Constitutional: Negative.   HENT: Negative.   Eyes: Negative.   Respiratory: Negative.    Cardiovascular: Negative.   Gastrointestinal: Negative.   Musculoskeletal: Negative.   Skin: Negative.   Neurological: Negative.   Psychiatric/Behavioral: Negative.     Blood pressure 105/72, pulse 70, temperature 98.5 F (36.9 C), temperature source Oral, resp. rate 16, height  (1.803 m), weight 88.5 kg, SpO2 100 %.Body mass index is 27.2 kg/m.  General Appearance: Disheveled  Eye Contact:  Fair  Speech:  Pressured  Volume:  Increased  Mood:  Irritable  Affect:  Congruent  Thought Process:  Disorganized  Orientation:  Full (Time, Place, and Person)  Thought Content:  Illogical  Suicidal Thoughts:  No  Homicidal Thoughts:  No  Memory:  Immediate;   Fair Recent;  Poor Remote;   Poor  Judgement:  Impaired  Insight:  Shallow  Psychomotor Activity:  Decreased  Concentration:  Concentration: Fair  Recall:  [employer] of Knowledge:  Fair  Language:  Fair  Akathisia:  No  Handed:  Right  AIMS (if indicated):     Assets:  Desire for Improvement Social Support  ADL's:  Intact  Cognition:  Impaired,  Mild  Sleep:  Number of Hours: 6     Treatment Plan Summary: Daily contact with patient to assess and evaluate symptoms and progress in treatment, Medication management and Plan Continue current medication management with antipsychotic and mood stabilizer.  Patient appears to be tolerating it and showing some improvement.  No change to any medical treatment.  We are either going to refer him to [facility] or if he improves enough that he would agree to another group home we can try for that especially once the situation with the virus calms down.  Mordecai Rasmussen, MD 11/01/2018, 5:15 PM

## 2018-11-01 NOTE — Progress Notes (Signed)
Recreation Therapy Notes   Date: 11/01/2018  Time: 9:30 am  Location: Craft Room  Behavioral response: Appropriate, Hyperactive, attention seeking, redirected, hyper-verbal  Intervention Topic: Problem Solving  Discussion/Intervention:  Group content on today was focused on problem solving. The group described what problem solving is. Patients expressed how problems affect them and how they deal with problems. Individuals identified healthy ways to deal with problems. Patients explained what normally happens to them when they do not deal with problems. The group expressed reoccurring problems for them. The group participated in the intervention "Ways to Solve problems" where patients were given a chance to explore different ways to solve problems.  Clinical Observations/Feedback:  Patient came to group late and defined problem solving as looking at the big picture like a puzzle and mapping things out. He gave an example of Madelaine Bhat and Clarene Critchley solving problem in the garden. Participant stated he solves problems by embracing his problems and current events. Patient described using resources to solve his problems. Participant was singing many times in group distracting peers and was redirected by group facilitator.Individual was social with peers and staff while participating in group.  Briahna Pescador LRT/CTRS          Caelen Reierson 11/01/2018 11:14 AM

## 2018-11-01 NOTE — [facility] Group Notes (Signed)
LCSW Group Therapy Note   11/01/2018 2:08 PM   Type of Therapy and Topic:  Group Therapy:  Overcoming Obstacles   Participation Level:  None   Description of Group:    In this group patients will be encouraged to explore what they see as obstacles to their own wellness and recovery. They will be guided to discuss their thoughts, feelings, and behaviors related to these obstacles. The group will process together ways to cope with barriers, with attention given to specific choices patients can make. Each patient will be challenged to identify changes they are motivated to make in order to overcome their obstacles. This group will be process-oriented, with patients participating in exploration of their own experiences as well as giving and receiving support and challenge from other group members.   Therapeutic Goals: 1. Patient will identify personal and current obstacles as they relate to admission. 2. Patient will identify barriers that currently interfere with their wellness or overcoming obstacles.  3. Patient will identify feelings, thought process and behaviors related to these barriers. 4. Patient will identify two changes they are willing to make to overcome these obstacles:      Summary of Patient Progress Pt was present, but did not provide any input to the group discussion.     Therapeutic Modalities:   Cognitive Behavioral Therapy Solution Focused Therapy Motivational Interviewing Relapse Prevention Therapy  Iris Pert, MSW, LCSW Clinical Social Work 11/01/2018 2:08 PM

## 2018-11-02 NOTE — Plan of Care (Signed)
Patient is alert and oriented X 4, denies SI, HI and AVH. Patient very loud, labile and assertive. Patient takes medication appropriately. Patient becomes very animated when among peers. Safety checks Q 15 minutes to continue. Problem: Education: Goal: Knowledge of [place] [facility] Education information/materials will improve Outcome: Progressing   Problem: Coping: Goal: Ability to verbalize frustrations and anger appropriately will improve Outcome: Progressing Goal: Ability to demonstrate self-control will improve Outcome: Progressing   Problem: Safety: Goal: Periods of time without injury will increase Outcome: Progressing   Problem: Education: Goal: Will be free of psychotic symptoms Outcome: Progressing Goal: Knowledge of the prescribed therapeutic regimen will improve Outcome: Progressing

## 2018-11-02 NOTE — [facility] Group Notes (Signed)
LCSW Group Therapy Note  11/02/2018 1:00 PM  Type of Therapy/Topic:  Group Therapy:  Feelings about Diagnosis  Participation Level:  Active   Description of Group:   This group will allow patients to explore their thoughts and feelings about diagnoses they have received. Patients will be guided to explore their level of understanding and acceptance of these diagnoses. Facilitator will encourage patients to process their thoughts and feelings about the reactions of others to their diagnosis and will guide patients in identifying ways to discuss their diagnosis with significant others in their lives. This group will be process-oriented, with patients participating in exploration of their own experiences, giving and receiving support, and processing challenge from other group members.   Therapeutic Goals: 1. Patient will demonstrate understanding of diagnosis as evidenced by identifying two or more symptoms of the disorder 2. Patient will be able to express two feelings regarding the diagnosis 3. Patient will demonstrate their ability to communicate their needs through discussion and/or role play  Summary of Patient Progress: Patient was present in group. Patient was manic as evidenced by rapid speech.  Patient often had to be redirected to allow for others to be able to speak during group.  Patient often required redirection to remain on topic as he would begin to answer a question then become tangential.  Patient was supportive of other group members. Patient discussed how family and friends have made him feel "less than" and "they're insensitive".  Patient actively participated in discussion on coping skills.     Therapeutic Modalities:   Cognitive Behavioral Therapy Brief Therapy Feelings Identification   Penni Homans, MSW, LCSW 11/02/2018 2:38 PM

## 2018-11-02 NOTE — Plan of Care (Signed)
Patient is seeing in the milieu loud and hyper verbal none violent or aggressive , appear dominating all conversations and drawing attention to him self, patient is not paranoid but denies SI,HI,AVH and denies any depression or anxiety, patient is having fun in the unit, making an impression of himself to his peers and getting religious with a Bible in his hand, safety is maintained, medication is taken and no signs of side effects noted. Patient is monitored for safety no distress. Problem: Education: Goal: Knowledge of [employer] [facility] Education information/materials will improve Outcome: Progressing   Problem: Coping: Goal: Ability to verbalize frustrations and anger appropriately will improve Outcome: Progressing Goal: Ability to demonstrate self-control will improve Outcome: Progressing   Problem: Safety: Goal: Periods of time without injury will increase Outcome: Progressing   Problem: Education: Goal: Will be free of psychotic symptoms Outcome: Progressing Goal: Knowledge of the prescribed therapeutic regimen will improve Outcome: Progressing   Problem: Self-Concept: Goal: Will verbalize positive feelings about self Outcome: Progressing   Problem: Education: Goal: Ability to state activities that reduce stress will improve Outcome: Progressing

## 2018-11-02 NOTE — Progress Notes (Signed)
Patient is loud and intrusive and imposing his will on people like a bully and other patient is scared of him because of his intrusiveness. Patient is difficult to redirect , he gets confrontational upon approach and hyper verbal , takes his medications , socially inappropriate behaviors with peers , sleep is continuous with requiring any sleep aid, patient denies any SI,HI,AVH monitored every 15 minutes for safety .

## 2018-11-02 NOTE — [facility] Group Notes (Signed)
CSW spoke with Lennox Laity the supervisor at [facility]  CSW was informed that the patient was taken off the waitlist following some miscommunication on [facility] end that the patient had been discharged.  She reports that the patient is back on the list and in the place that he would have been had he not been removed.  CSW was asked to refax the information.    CSW did re-fax the information and received conformation of the fax.  Penni Homans, MSW, LCSW 11/02/2018 3:02 PM

## 2018-11-02 NOTE — [facility] Counselor (Signed)
CSW called to figure out why patient had reportedly been removed from the waitlist, per another CSW who called to check on this status of the referral.  CSW spoke with Ron.  Ron confirmed that patient was taken off the list.  Ron reports that he will transfer to the nurse.    Staff member EJ got on the phone after 10 minute wait to report that nurse will call back at earliest convenience, he was unable to provide a better time for this CSW to call.  He did take this CSW name and number.   Penni Homans, MSW, LCSW 11/02/2018 10:17 AM

## 2018-11-02 NOTE — Progress Notes (Signed)
Recreation Therapy Notes  Date: 11/02/2018  Time: 9:30 am  Location: Craft Room  Behavioral response: Appropriate  Intervention Topic: Communication  Discussion/Intervention:  Group content today was focused on communication. The group defined communication and ways to communicate with others. Individuals stated reason why communication is important and some reasons to communicate with others. Patients expressed if they thought they were good at communicating with others and ways they could improve their communication skills. The group identified important parts of communication and some experiences they have had in the past with communication. The group participated in the intervention "What is that?", where they had a chance to test out their communication skills and identify ways to improve their communication techniques.  Clinical Observations/Feedback:  Patient came to group late and was focused on what peers and staff had to say about communication. Individual left group early due to unknown reasons and never returned.  Orly Quimby LRT/CTRS         Baani Bober 11/02/2018 10:47 AM

## 2018-11-02 NOTE — Progress Notes (Signed)
[facility] MD Progress Note  11/02/2018 4:16 PM Jackson [name]  MRN:  [id number] Subjective: Patient seen.  Chart reviewed.  Patient today seems to be quite a bit better.  He was calm and able to stay seated and have a conversation without becoming insulting or going off topic.  Still makes odd gestures with his hands but has not been intrusive or aggressive.  Appears to be compliant with medicine.  Tells me today that he would be agreeable to going back to a group home. Principal Problem: Schizoaffective disorder, bipolar type (HCC) Diagnosis: Principal Problem:   Schizoaffective disorder, bipolar type (HCC) Active Problems:   Noncompliance  Total Time spent with patient: 30 minutes  Past Psychiatric History: Patient has a history of bipolar disorder with lengthy previous hospitalizations.  Past Medical History:  Past Medical History:  Diagnosis Date  . Bipolar 1 disorder (HCC)   . Schizoaffective disorder (HCC)    History reviewed. No pertinent surgical history. Family History: History reviewed. No pertinent family history. Family Psychiatric  History: None known Social History:  Social History   Substance and Sexual Activity  Alcohol Use Not on file     Social History   Substance and Sexual Activity  Drug Use Not on file    Social History   Socioeconomic History  . Marital status: Single    Spouse name: Not on file  . Number of children: Not on file  . Years of education: Not on file  . Highest education level: Not on file  Occupational History  . Not on file  Social Needs  . Financial resource strain: Not on file  . Food insecurity:    Worry: Not on file    Inability: Not on file  . Transportation needs:    Medical: Not on file    Non-medical: Not on file  Tobacco Use  . Smoking status: Light Tobacco Smoker    Packs/day: 0.25    Types: Cigarettes  . Smokeless tobacco: Never Used  Substance and Sexual Activity  . Alcohol use: Not on file  . Drug use: Not on  file  . Sexual activity: Not on file  Lifestyle  . Physical activity:    Days per week: Not on file    Minutes per session: Not on file  . Stress: Not on file  Relationships  . Social connections:    Talks on phone: Not on file    Gets together: Not on file    Attends religious service: Not on file    Active member of club or organization: Not on file    Attends meetings of clubs or organizations: Not on file    Relationship status: Not on file  Other Topics Concern  . Not on file  Social History Narrative  . Not on file   Additional Social History:                         Sleep: Fair  Appetite:  Fair  Current Medications: Current Facility-Administered Medications  Medication Dose Route Frequency Provider Last Rate Last Dose  . acetaminophen (TYLENOL) tablet 650 mg  650 mg Oral Q6H PRN [name], MD   650 mg at 10/26/18 2302  . alum & mag hydroxide-simeth (MAALOX/MYLANTA) 200-200-20 MG/5ML suspension 30 mL  30 mL Oral Q4H PRN [name], MD   30 mL at 10/26/18 2129  . ARIPiprazole (ABILIFY) tablet 15 mg  15 mg Oral Daily Itza Maniaci, [name], MD  15 mg at 11/02/18 0815  . divalproex (DEPAKOTE) DR tablet 500 mg  500 mg Oral Q12H Byrant Valent T, MD   500 mg at 11/02/18 0815  . hydrOXYzine (ATARAX/VISTARIL) tablet 50 mg  50 mg Oral Q4H PRN [name], MD   50 mg at 10/28/18 1655  . magnesium hydroxide (MILK OF MAGNESIA) suspension 30 mL  30 mL Oral Daily PRN [name], MD      . OLANZapine [facility]) injection 10 mg  10 mg Intramuscular Q6H PRN Krosby Ritchie T, MD      . OLANZapine (ZYPREXA) tablet 15 mg  15 mg Oral Q12H Adelisa Satterwhite, [name], MD   15 mg at 11/02/18 0818  . traZODone (DESYREL) tablet 100 mg  100 mg Oral QHS PRN [name], MD   100 mg at 10/26/18 0034    Lab Results: No results found for this or any previous visit (from the past 48 hour(s)).  Blood Alcohol level:  Lab Results  Component Value Date   ETH <10  10/16/2018    Metabolic Disorder Labs: Lab Results  Component Value Date   HGBA1C 5.2 10/23/2018   MPG 102.54 10/23/2018   No results found for: PROLACTIN Lab Results  Component Value Date   CHOL 129 10/23/2018   TRIG 58 10/23/2018   HDL 50 10/23/2018   CHOLHDL 2.6 10/23/2018   VLDL 12 10/23/2018   LDLCALC 67 10/23/2018    Physical Findings: AIMS: Facial and Oral Movements Muscles of Facial Expression: None, normal Lips and Perioral Area: None, normal Jaw: None, normal Tongue: None, normal,Extremity Movements Upper (arms, wrists, hands, fingers): None, normal Lower (legs, knees, ankles, toes): None, normal, Trunk Movements Neck, shoulders, hips: None, normal, Overall Severity Severity of abnormal movements (highest score from questions above): None, normal Incapacitation due to abnormal movements: None, normal Patient's awareness of abnormal movements (rate only patient's report): No Awareness, Dental Status Current problems with teeth and/or dentures?: No Does patient usually wear dentures?: No  CIWA:  CIWA-Ar Total: 0 COWS:  COWS Total Score: 0  Musculoskeletal: Strength & Muscle Tone: within normal limits Gait & Station: normal Patient leans: N/A  Psychiatric Specialty Exam: Physical Exam  Nursing note and vitals reviewed. Constitutional: He appears well-developed and well-nourished.  HENT:  Head: Normocephalic and atraumatic.  Eyes: Pupils are equal, round, and reactive to light. Conjunctivae are normal.  Neck: Normal range of motion.  Cardiovascular: Regular rhythm and normal heart sounds.  Respiratory: Effort normal. No respiratory distress.  GI: Soft.  Musculoskeletal: Normal range of motion.  Neurological: He is alert.  Skin: Skin is warm and dry.  Psychiatric: He has a normal mood and affect. His speech is normal. Judgment and thought content normal. He is agitated. He is not aggressive. Cognition and memory are normal.    Review of Systems   Constitutional: Negative.   HENT: Negative.   Eyes: Negative.   Respiratory: Negative.   Cardiovascular: Negative.   Gastrointestinal: Negative.   Musculoskeletal: Negative.   Skin: Negative.   Neurological: Negative.   Psychiatric/Behavioral: Negative.     Blood pressure 117/74, pulse 74, temperature (!) 97 F (36.1 C), temperature source Oral, resp. rate 18, height 5\' 11"  (1.803 m), weight 88.5 kg, SpO2 100 %.Body mass index is 27.2 kg/m.  General Appearance: Casual  Eye Contact:  Fair  Speech:  Clear and Coherent  Volume:  Normal  Mood:  Euthymic  Affect:  Congruent  Thought Process:  Goal Directed  Orientation:  Full (  Time, Place, and Person)  Thought Content:  Logical  Suicidal Thoughts:  No  Homicidal Thoughts:  No  Memory:  Immediate;   Fair Recent;   Fair Remote;   Fair  Judgement:  Fair  Insight:  Fair  Psychomotor Activity:  Decreased  Concentration:  Concentration: Poor  Recall:  [employer]  Fund of Knowledge:  Fair  Language:  Fair  Akathisia:  No  Handed:  Right  AIMS (if indicated):     Assets:  Communication Skills Desire for Improvement Physical Health Resilience Social Support  ADL's:  Intact  Cognition:  WNL  Sleep:  Number of Hours: 6     Treatment Plan Summary: Daily contact with patient to assess and evaluate symptoms and progress in treatment, Medication management and Plan Patient actually seems to be improving a little bit.  He is now stating that he would be agreeable to go back to a group home which is crucial.  I will talk with the treatment team tomorrow and we will see if there are options of places we could get him into live.  Check Depakote level tomorrow.  Mordecai RasmussenJohn Jeani Fassnacht, MD 11/02/2018, 4:16 PM

## 2018-11-03 LAB — VALPROIC ACID LEVEL: Valproic Acid Lvl: 62 ug/mL (ref 50.0–100.0)

## 2018-11-03 MED ORDER — DIVALPROEX SODIUM 500 MG PO DR TAB
1000.0000 mg | DELAYED_RELEASE_TABLET | Freq: Two times a day (BID) | ORAL | Status: DC
  Administered 2018-11-03 – 2018-11-18 (×29): 1000 mg via ORAL
  Filled 2018-11-03 (×28): qty 2

## 2018-11-03 NOTE — [facility] Group Notes (Signed)
Emotional Regulation 11/03/2018 1PM  Type of Therapy/Topic:  Group Therapy:  Emotion Regulation  Participation Level:  Minimal   Description of Group:   The purpose of this group is to assist patients in learning to regulate negative emotions and experience positive emotions. Patients will be guided to discuss ways in which they have been vulnerable to their negative emotions. These vulnerabilities will be juxtaposed with experiences of positive emotions or situations, and patients will be challenged to use positive emotions to combat negative ones. Special emphasis will be placed on coping with negative emotions in conflict situations, and patients will process healthy conflict resolution skills.  Therapeutic Goals: 1. Patient will identify two positive emotions or experiences to reflect on in order to balance out negative emotions 2. Patient will label two or more emotions that they find the most difficult to experience 3. Patient will demonstrate positive conflict resolution skills through discussion and/or role plays  Summary of Patient Progress:  Minimal input provided during today's group. Pt doodled on paper majority of the group.     Therapeutic Modalities:   Cognitive Behavioral Therapy Feelings Identification Dialectical Behavioral Therapy   Suzan Slick, LCSW 11/03/2018 2:10 PM

## 2018-11-03 NOTE — Progress Notes (Signed)
Recreation Therapy Notes  Date: 11/03/2018  Time: 9:30 am  Location: Craft Room  Behavioral response: Appropriate  Intervention Topic: Coping skills  Discussion/Intervention:  Group content on today was focused on coping skills. The group defined what coping skills are and when they can be used. Individuals described how they normally cope with thing and the coping skills they normally use. Patients expressed why it is important to cope with things and how not coping with things can affect you. The group participated in the intervention "Exploring coping skills" where they had a chance to test new coping skills they could use in the future.  Clinical Observations/Feedback:  Patient came to group and defined coping skills as problem management. He explained that he participated in meditation as a coping skill. Individual participated in the intervention and was social with peers and staff. Participant laid his head down on the table the remainder of the group expressing the medication is making him tired.  Kemond Amorin LRT/CTRS           Roseanne Juenger 11/03/2018 10:52 AM

## 2018-11-03 NOTE — Progress Notes (Signed)
D: Pt during assessments denies SI/HI/AVH, contracts for safety. 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 Pt. 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 Pt. Takes about how he needs to get discharged so he can clean up all these messes that are popping up with his employees not getting paid. Pt. Also makes religous comments about how the covid-19 is a warning and the mark of the beast is coming soon.   A: Q x 15 minute observation checks were completed for safety. Patient was provided with education, but is non-accepting. Patient was given/offered medications per orders. Patient  was encourage to attend groups, participate in unit activities and continue with plan of care. Pt. Chart and plans of care reviewed. Pt. Given support and encouragement.   R: Patient is complaint with medications and unit procedures this evening with direction and encouragement. Pt. Is still reluctant to take his medications, but takes them. Pt. Observed out in the day room frequently and or in the hallways. Pt. Attends group and snack time, eating good.             Precautionary checks every 15 minutes for safety maintained, room free of safety hazards, patient sustains no injury or falls during this shift. Will endorse care to next shift.

## 2018-11-03 NOTE — Progress Notes (Signed)
[facility] MD Progress Note  11/03/2018 4:40 PM Kyle Bisson  MRN:  [id number] Subjective: Follow-up for this gentleman with schizoaffective disorder.  Patient has been compliant with medication and Depakote level has come back at 62.  Patient himself has no new complaints.  Much less intrusive and agitated.  Able to sit down and have lucid conversations.  Patient had volunteered yesterday that he was willing to go back to the group home unfortunately the one he was living at is refusing to take him back.  No new physical complaints today.  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  This was the first time I have heard him refer to that delusion recently. Principal Problem: Schizoaffective disorder, bipolar type (HCC) Diagnosis: Principal Problem:   Schizoaffective disorder, bipolar type (HCC) Active Problems:   Noncompliance  Total Time spent with patient: 30 minutes  Past Psychiatric History: Patient has a history of bipolar or schizoaffective disorder with lengthy hospitalizations.  Past Medical History:  Past Medical History:  Diagnosis Date  . Bipolar 1 disorder (HCC)   . Schizoaffective disorder (HCC)    History reviewed. No pertinent surgical history. Family History: History reviewed. No pertinent family history. Family Psychiatric  History: See previous Social History:  Social History   Substance and Sexual Activity  Alcohol Use Not on file     Social History   Substance and Sexual Activity  Drug Use Not on file    Social History   Socioeconomic History  . Marital status: Single    Spouse name: Not on file  . Number of children: Not on file  . Years of education: Not on file  . Highest education level: Not on file  Occupational History  . Not on file  Social Needs  . Financial resource strain: Not on file  . Food  insecurity:    Worry: Not on file    Inability: Not on file  . Transportation needs:    Medical: Not on file    Non-medical: Not on file  Tobacco Use  . Smoking status: Light Tobacco Smoker    Packs/day: 0.25    Types: Cigarettes  . Smokeless tobacco: Never Used  Substance and Sexual Activity  . Alcohol use: Not on file  . Drug use: Not on file  . Sexual activity: Not on file  Lifestyle  . Physical activity:    Days per week: Not on file    Minutes per session: Not on file  . Stress: Not on file  Relationships  . Social connections:    Talks on phone: Not on file    Gets together: Not on file    Attends religious service: Not on file    Active member of club or organization: Not on file    Attends meetings of clubs or organizations: Not on file    Relationship status: Not on file  Other Topics Concern  . Not on file  Social History Narrative  . Not on file   Additional Social History:                         Sleep: Fair  Appetite:  Fair  Current Medications: Current Facility-Administered Medications  Medication Dose Route Frequency Provider Last Rate Last Dose  . acetaminophen (TYLENOL) tablet 650 mg  650  mg Oral Q6H PRN [name], MD   650 mg at 10/26/18 2302  . alum & mag hydroxide-simeth (MAALOX/MYLANTA) 200-200-20 MG/5ML suspension 30 mL  30 mL Oral Q4H PRN [name], MD   30 mL at 10/26/18 2129  . ARIPiprazole (ABILIFY) tablet 15 mg  15 mg Oral Daily Bryleigh Ottaway, [name], MD   15 mg at 11/03/18 0813  . divalproex (DEPAKOTE) DR tablet 1,000 mg  1,000 mg Oral Q12H Karri Kallenbach T, MD      . hydrOXYzine (ATARAX/VISTARIL) tablet 50 mg  50 mg Oral Q4H PRN [name], MD   50 mg at 10/28/18 3557  . magnesium hydroxide (MILK OF MAGNESIA) suspension 30 mL  30 mL Oral Daily PRN [name], MD      . OLANZapine [facility]) injection 10 mg  10 mg Intramuscular Q6H PRN Katharyn Schauer T, MD      . OLANZapine (ZYPREXA) tablet 15 mg  15  mg Oral Q12H Paulita Licklider, [name], MD   15 mg at 11/03/18 0813  . traZODone (DESYREL) tablet 100 mg  100 mg Oral QHS PRN [name], MD   100 mg at 10/26/18 3220    Lab Results:  Results for orders placed or performed during the hospital encounter of 10/17/18 (from the past 48 hour(s))  Valproic acid level     Status: None   Collection Time: 11/03/18  6:44 AM  Result Value Ref Range   Valproic Acid Lvl 62 50.0 - 100.0 ug/mL    Comment: Performed at [facility], [address] [place], [place] [address]    Blood Alcohol level:  Lab Results  Component Value Date   [facility] <10 10/16/2018    Metabolic Disorder Labs: Lab Results  Component Value Date   HGBA1C 5.2 10/23/2018   MPG 102.54 10/23/2018   No results found for: PROLACTIN Lab Results  Component Value Date   CHOL 129 10/23/2018   TRIG 58 10/23/2018   HDL 50 10/23/2018   CHOLHDL 2.6 10/23/2018   VLDL 12 10/23/2018   LDLCALC 67 10/23/2018    Physical Findings: AIMS: Facial and Oral Movements Muscles of Facial Expression: None, normal Lips and Perioral Area: None, normal Jaw: None, normal Tongue: None, normal,Extremity Movements Upper (arms, wrists, hands, fingers): None, normal Lower (legs, knees, ankles, toes): None, normal, Trunk Movements Neck, shoulders, hips: None, normal, Overall Severity Severity of abnormal movements (highest score from questions above): None, normal Incapacitation due to abnormal movements: None, normal Patient's awareness of abnormal movements (rate only patient's report): No Awareness, Dental Status Current problems with teeth and/or dentures?: No Does patient usually wear dentures?: No  CIWA:  CIWA-Ar Total: 0 COWS:  COWS Total Score: 0  Musculoskeletal: Strength & Muscle Tone: within normal limits Gait & Station: normal Patient leans: N/A  Psychiatric Specialty Exam: Physical Exam  Nursing note and vitals reviewed. Constitutional: He appears well-developed and  well-nourished.  HENT:  Head: Normocephalic and atraumatic.  Eyes: Pupils are equal, round, and reactive to light. Conjunctivae are normal.  Neck: Normal range of motion.  Cardiovascular: Regular rhythm and normal heart sounds.  Respiratory: Effort normal. No respiratory distress.  GI: Soft.  Musculoskeletal: Normal range of motion.  Neurological: He is alert.  Skin: Skin is warm and dry.  Psychiatric: He has a normal mood and affect. His speech is normal and behavior is normal. Thought content normal. Cognition and memory are normal. He expresses impulsivity.    Review of Systems  Constitutional: Negative.   HENT: Negative.   Eyes: Negative.   Respiratory: Negative.   Cardiovascular: Negative.   Gastrointestinal: Negative.   Musculoskeletal: Negative.   Skin: Negative.   Neurological: Negative.   Psychiatric/Behavioral: Negative.     Blood pressure 121/84, pulse 69, temperature 97.9 F (36.6 C), temperature source Oral, resp. rate 18, height 5\' 11"  (1.803 m), weight 88.5 kg, SpO2 100 %.Body mass index is 27.2 kg/m.  General Appearance: Casual  Eye Contact:  Fair  Speech:  Clear and Coherent  Volume:  Normal  Mood:  Euthymic  Affect:  Constricted  Thought Process:  Coherent  Orientation:  Full (Time, Place, and Person)  Thought Content:  Logical  Suicidal Thoughts:  No  Homicidal Thoughts:  No  Memory:  Immediate;   Fair Recent;   Fair Remote;   Fair  Judgement:  Fair  Insight:  Fair  Psychomotor Activity:  Normal  Concentration:  Concentration: Fair  Recall:  [employer]Fair  Fund of Knowledge:  Fair  Language:  Fair  Akathisia:  No  Handed:  Right  AIMS (if indicated):     Assets:  Desire for Improvement  ADL's:  Intact  Cognition:  WNL  Sleep:  Number of Hours: 5.5     Treatment Plan Summary: Daily contact with patient to assess and evaluate symptoms and progress in treatment, Medication management and Plan Increased dose of Depakote slightly to 2000 mg a day.   Continue antipsychotic.  Continue individual and group therapy.  We are at an impasse as far as discharge given the central region may take a very long time.  We will try to contact his mother and see if she would come up with any other ideas or plans for his treatment at this point.  No other change to medicine.  Mordecai RasmussenJohn Ronte Parker, MD 11/03/2018, 4:40 PM

## 2018-11-03 NOTE — Plan of Care (Signed)
Pt. Denies si/hi/avh, able to contract for safety. Pt. Continues to present disorganized in his thought process, grandiose, and somatic. Pt. Monitored for safety on the unit.    Problem: Education: Goal: Will be free of psychotic symptoms Outcome: Not Progressing   Problem: Safety: Goal: Periods of time without injury will increase Outcome: Progressing

## 2018-11-04 NOTE — Plan of Care (Signed)
Patient aware of  information  given relating to [facility] education , patient  able to verbalize  understanding .  No anger outburst for past 24 hours . Able to maintained  self control . Voice of no safety concerns. Thought process improving. Compliant  with medication.. Patient able to verbalize positive feeling of self .  Able to attend unit programing  that help reduce anxiety .  Problem: Education: Goal: Knowledge of [facility] Education information/materials will improve Outcome: Progressing   Problem: Coping: Goal: Ability to verbalize frustrations and anger appropriately will improve Outcome: Progressing Goal: Ability to demonstrate self-control will improve Outcome: Progressing   Problem: Safety: Goal: Periods of time without injury will increase Outcome: Progressing   Problem: Education: Goal: Will be free of psychotic symptoms Outcome: Progressing Goal: Knowledge of the prescribed therapeutic regimen will improve Outcome: Progressing   Problem: Self-Concept: Goal: Will verbalize positive feelings about self Outcome: Progressing   Problem: Education: Goal: Ability to state activities that reduce stress will improve Outcome: Progressing

## 2018-11-04 NOTE — [facility] Group Notes (Signed)
[facility] Group Notes:  (Nursing/MHT/Case Management/Adjunct)  Date:  11/04/2018  Time:  12:37 AM  Type of Therapy:  Group Therapy  Participation Level:  Active  Participation Quality:  Appropriate  Affect:  Appropriate  Cognitive:  Appropriate  Insight:  Appropriate  Engagement in Group:  Engaged  Modes of Intervention:  Support  Summary of Progress/Problems:  Landry Mellow 11/04/2018, 12:37 AM

## 2018-11-04 NOTE — Progress Notes (Signed)
Recreation Therapy Notes  Date: 11/04/2018  Time: 9:30 am   Location: Craft room   Behavioral response: N/A   Intervention Topic: Self-Care  Discussion/Intervention: Patient did not attend group.   Clinical Observations/Feedback:  Patient did not attend group.   Ramaj Frangos LRT/CTRS        Dorthie Santini 11/04/2018 10:36 AM

## 2018-11-04 NOTE — Plan of Care (Signed)
Pt. Denies si/hi/avh, able to contract for safety when asked. Pt. Continues to present tangential, expressing paranoid and somatic ideations. Pt. Continues to be also express grandiose delusions. Pt. Mood is labile overall, observed singing randomly and dancing in the hallways.    Problem: Safety: Goal: Periods of time without injury will increase Outcome: Progressing   Problem: Education: Goal: Will be free of psychotic symptoms Outcome: Not Progressing

## 2018-11-04 NOTE — Progress Notes (Signed)
[facility] MD Progress Note  11/04/2018 3:38 PM Jackson [name]  MRN:  [id number] Subjective: Patient seen.  Chart reviewed.  I also spoke with his mother who is also his guardian.  Patient himself presents as being significantly improved compared to when he first came into the hospital.  He is not loud constantly and he is able to sit down and have a conversation.  Speaking with his mother and stepfather however they report that when he speaks to them on the telephone he continues to curse make threatening statements and make bizarre statements such as telling them that he is casting magic spells on them.  They raise serious concern about his ability to function outside the hospital particularly if he is not in a 24-hour contained environment.  Would not feel safe with him coming back to their home right now.  Patient has no specific physical complaints. Principal Problem: Schizoaffective disorder, bipolar type (HCC) Diagnosis: Principal Problem:   Schizoaffective disorder, bipolar type (HCC) Active Problems:   Noncompliance  Total Time spent with patient: 30 minutes  Past Psychiatric History: Patient has a history of bipolar disorder with extended periods of disorganized mania.  Past Medical History:  Past Medical History:  Diagnosis Date  . Bipolar 1 disorder (HCC)   . Schizoaffective disorder (HCC)    History reviewed. No pertinent surgical history. Family History: History reviewed. No pertinent family history. Family Psychiatric  History: None known Social History:  Social History   Substance and Sexual Activity  Alcohol Use Not on file     Social History   Substance and Sexual Activity  Drug Use Not on file    Social History   Socioeconomic History  . Marital status: Single    Spouse name: Not on file  . Number of children: Not on file  . Years of education: Not on file  . Highest education level: Not on file  Occupational History  . Not on file  Social Needs  . Financial  resource strain: Not on file  . Food insecurity:    Worry: Not on file    Inability: Not on file  . Transportation needs:    Medical: Not on file    Non-medical: Not on file  Tobacco Use  . Smoking status: Light Tobacco Smoker    Packs/day: 0.25    Types: Cigarettes  . Smokeless tobacco: Never Used  Substance and Sexual Activity  . Alcohol use: Not on file  . Drug use: Not on file  . Sexual activity: Not on file  Lifestyle  . Physical activity:    Days per week: Not on file    Minutes per session: Not on file  . Stress: Not on file  Relationships  . Social connections:    Talks on phone: Not on file    Gets together: Not on file    Attends religious service: Not on file    Active member of club or organization: Not on file    Attends meetings of clubs or organizations: Not on file    Relationship status: Not on file  Other Topics Concern  . Not on file  Social History Narrative  . Not on file   Additional Social History:                         Sleep: Fair  Appetite:  Fair  Current Medications: Current Facility-Administered Medications  Medication Dose Route Frequency Provider Last Rate Last Dose  . acetaminophen (  TYLENOL) tablet 650 mg  650 mg Oral Q6H PRN [name], MD   650 mg at 10/26/18 2302  . alum & mag hydroxide-simeth (MAALOX/MYLANTA) 200-200-20 MG/5ML suspension 30 mL  30 mL Oral Q4H PRN [name], MD   30 mL at 10/26/18 2129  . ARIPiprazole (ABILIFY) tablet 15 mg  15 mg Oral Daily Valdemar Mcclenahan, [name], MD   15 mg at 11/04/18 0756  . divalproex (DEPAKOTE) DR tablet 1,000 mg  1,000 mg Oral Q12H Elice Crigger T, MD   1,000 mg at 11/04/18 0756  . hydrOXYzine (ATARAX/VISTARIL) tablet 50 mg  50 mg Oral Q4H PRN [name], MD   50 mg at 10/28/18 9021  . magnesium hydroxide (MILK OF MAGNESIA) suspension 30 mL  30 mL Oral Daily PRN [name], MD      . OLANZapine [facility]) injection 10 mg  10 mg Intramuscular Q6H PRN  Zabria Liss T, MD      . OLANZapine (ZYPREXA) tablet 15 mg  15 mg Oral Q12H Alejandro Adcox, [name], MD   15 mg at 11/04/18 0756  . traZODone (DESYREL) tablet 100 mg  100 mg Oral QHS PRN [name], MD   100 mg at 10/26/18 1155    Lab Results:  Results for orders placed or performed during the hospital encounter of 10/17/18 (from the past 48 hour(s))  Valproic acid level     Status: None   Collection Time: 11/03/18  6:44 AM  Result Value Ref Range   Valproic Acid Lvl 62 50.0 - 100.0 ug/mL    Comment: Performed at [facility], [address] [place], [place] [address]    Blood Alcohol level:  Lab Results  Component Value Date   [facility] <10 10/16/2018    Metabolic Disorder Labs: Lab Results  Component Value Date   HGBA1C 5.2 10/23/2018   MPG 102.54 10/23/2018   No results found for: PROLACTIN Lab Results  Component Value Date   CHOL 129 10/23/2018   TRIG 58 10/23/2018   HDL 50 10/23/2018   CHOLHDL 2.6 10/23/2018   VLDL 12 10/23/2018   LDLCALC 67 10/23/2018    Physical Findings: AIMS: Facial and Oral Movements Muscles of Facial Expression: None, normal Lips and Perioral Area: None, normal Jaw: None, normal Tongue: None, normal,Extremity Movements Upper (arms, wrists, hands, fingers): None, normal Lower (legs, knees, ankles, toes): None, normal, Trunk Movements Neck, shoulders, hips: None, normal, Overall Severity Severity of abnormal movements (highest score from questions above): None, normal Incapacitation due to abnormal movements: None, normal Patient's awareness of abnormal movements (rate only patient's report): No Awareness, Dental Status Current problems with teeth and/or dentures?: No Does patient usually wear dentures?: No  CIWA:  CIWA-Ar Total: 0 COWS:  COWS Total Score: 0  Musculoskeletal: Strength & Muscle Tone: within normal limits Gait & Station: normal Patient leans: N/A  Psychiatric Specialty Exam: Physical Exam  Nursing note and  vitals reviewed. Constitutional: He appears well-developed and well-nourished.  HENT:  Head: Normocephalic and atraumatic.  Eyes: Pupils are equal, round, and reactive to light. Conjunctivae are normal.  Neck: Normal range of motion.  Cardiovascular: Regular rhythm and normal heart sounds.  Respiratory: Effort normal.  GI: Soft.  Musculoskeletal: Normal range of motion.  Neurological: He is alert.  Skin: Skin is warm and dry.  Psychiatric: His affect is blunt. His speech is tangential. He is not agitated and not aggressive. Thought content is paranoid. Cognition and memory are normal. He expresses impulsivity  and inappropriate judgment. He expresses no homicidal and no suicidal ideation.    Review of Systems  Constitutional: Negative.   HENT: Negative.   Eyes: Negative.   Respiratory: Negative.   Cardiovascular: Negative.   Gastrointestinal: Negative.   Musculoskeletal: Negative.   Skin: Negative.   Neurological: Negative.   Psychiatric/Behavioral: Negative.     Blood pressure 112/79, pulse 68, temperature 98.6 F (37 C), temperature source Oral, resp. rate 17, height  (1.803 m), weight 88.5 kg, SpO2 100 %.Body mass index is 27.2 kg/m.  General Appearance: Casual  Eye Contact:  Fair  Speech:  Clear and Coherent  Volume:  Decreased  Mood:  Euthymic  Affect:  Constricted  Thought Process:  Disorganized  Orientation:  Full (Time, Place, and Person)  Thought Content:  Rumination and Tangential  Suicidal Thoughts:  No  Homicidal Thoughts:  No  Memory:  Immediate;   Fair Recent;   Fair Remote;   Fair  Judgement:  Impaired  Insight:  Shallow  Psychomotor Activity:  Normal  Concentration:  Concentration: Poor  Recall:  [employer] of Knowledge:  Fair  Language:  Fair  Akathisia:  No  Handed:  Right  AIMS (if indicated):     Assets:  Architect Physical Health Resilience  ADL's:  Impaired  Cognition:  Impaired,  Mild   Sleep:  Number of Hours: 4     Treatment Plan Summary: Daily contact with patient to assess and evaluate symptoms and progress in treatment, Medication management and Plan While patient is improved compared to admission he is still far off his baseline.  He is able to hold it together speaking to me and to some of the nurses but still reveals psychosis talking with his family.  He is not sleeping well at night.  Still not able to come up with a clear plan for the future and still believes that he is making large amounts of money off of the Internet.  Patient would do poorly outside the hospital.  His commitment is likely to expire soon so I think we will have to renew the request for a longer term in the hospital.  [facility] at [facility].  No change to medicine today.  Mordecai Rasmussen, MD 11/04/2018, 3:38 PM

## 2018-11-04 NOTE — Progress Notes (Signed)
D: Patient stated slept good last night .Stated appetite is good and energy level   normal. Stated concentration  good . Stated no Depression  , hopelessness  Or anxiety .Denies suicidal  homicidal ideations  .  No auditory hallucinations  No pain concerns . Appropriate ADL'S. Interacting with peers and staff. Patient aware of  information  given relating to [facility] education , patient  able to verbalize  understanding .  No anger outburst for past 24 hours . Able to maintained  self control . Voice of no safety concerns. Thought process improving. Compliant  with medication.. Patient able to verbalize positive feeling of self .  Able to attend unit programing  that help reduce anxiety .  A: Encourage patient participation with unit programming . Instruction  Given on  Medication , verbalize understanding.  R: Voice no other concerns. Staff continue to monitor

## 2018-11-04 NOTE — [facility] Group Notes (Signed)
LCSW Group Therapy Note  11/04/2018 11:30 AM  Type of Therapy/Topic:  Group Therapy:  Balance in Life  Participation Level:  Did Not Attend  Description of Group:    This group will address the concept of balance and how it feels and looks when one is unbalanced. Patients will be encouraged to process areas in their lives that are out of balance and identify reasons for remaining unbalanced. Facilitators will guide patients in utilizing problem-solving interventions to address and correct the stressor making their life unbalanced. Understanding and applying boundaries will be explored and addressed for obtaining and maintaining a balanced life. Patients will be encouraged to explore ways to assertively make their unbalanced needs known to significant others in their lives, using other group members and facilitator for support and feedback.  Therapeutic Goals: 1. Patient will identify two or more emotions or situations they have that consume much of in their lives. 2. Patient will identify signs/triggers that life has become out of balance:  3. Patient will identify two ways to set boundaries in order to achieve balance in their lives:  4. Patient will demonstrate ability to communicate their needs through discussion and/or role plays  Summary of Patient Progress: x     Therapeutic Modalities:   Cognitive Behavioral Therapy Solution-Focused Therapy Assertiveness Training  Iris Pert, MSW, LCSW Clinical Social Work 11/04/2018 11:30 AM

## 2018-11-05 NOTE — Tx Team (Signed)
Interdisciplinary Treatment and Diagnostic Plan Update  11/05/2018 Time of Session: 8:30AM  Jackson [name] MRN: [id number]  Principal Diagnosis: Schizoaffective disorder, bipolar type [facility])  Secondary Diagnoses: Principal Problem:   Schizoaffective disorder, bipolar type (HCC) Active Problems:   Noncompliance   Current Medications:  Current Facility-Administered Medications  Medication Dose Route Frequency Provider Last Rate Last Dose  . acetaminophen (TYLENOL) tablet 650 mg  650 mg Oral Q6H PRN [name], MD   650 mg at 10/26/18 2302  . alum & mag hydroxide-simeth (MAALOX/MYLANTA) 200-200-20 MG/5ML suspension 30 mL  30 mL Oral Q4H PRN [name], MD   30 mL at 10/26/18 2129  . ARIPiprazole (ABILIFY) tablet 15 mg  15 mg Oral Daily [name], [name], MD   15 mg at 11/05/18 0813  . divalproex (DEPAKOTE) DR tablet 1,000 mg  1,000 mg Oral Q12H [name], [name], MD   1,000 mg at 11/05/18 0811  . hydrOXYzine (ATARAX/VISTARIL) tablet 50 mg  50 mg Oral Q4H PRN [name], MD   50 mg at 10/28/18 0223  . magnesium hydroxide (MILK OF MAGNESIA) suspension 30 mL  30 mL Oral Daily PRN [name], MD      . OLANZapine [facility]) injection 10 mg  10 mg Intramuscular Q6H PRN [name], [name], MD      . OLANZapine (ZYPREXA) tablet 15 mg  15 mg Oral Q12H [name], [name], MD   15 mg at 11/05/18 3612  . traZODone (DESYREL) tablet 100 mg  100 mg Oral QHS PRN [name], MD   100 mg at 10/26/18 0034   PTA Medications: No medications prior to admission.    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   Physician Treatment Plan for Primary Diagnosis: Schizoaffective disorder, bipolar type (HCC) Long  Term Goal(s): Improvement in symptoms so as ready for discharge Improvement in symptoms so as ready for discharge   Short Term Goals: Ability to demonstrate self-control will improve Ability to identify and develop effective coping behaviors will improve Compliance with prescribed medications will improve  Medication Management: Evaluate patient's response, side effects, and tolerance of medication regimen.  Therapeutic Interventions: 1 to 1 [place], Unit Group [place] and Medication administration.  Evaluation of Outcomes: Progressing  Physician Treatment Plan for Secondary Diagnosis: Principal Problem:   Schizoaffective disorder, bipolar type (HCC) Active Problems:   Noncompliance  Long Term Goal(s): Improvement in symptoms so as ready for discharge Improvement in symptoms so as ready for discharge   Short Term Goals: Ability to demonstrate self-control will improve Ability to identify and develop effective coping behaviors will improve Compliance with prescribed medications will improve     Medication Management: Evaluate patient's response, side effects, and tolerance of medication regimen.  Therapeutic Interventions: 1 to 1 Games, Unit Group [place] and Medication administration.  Evaluation of Outcomes: Progressing   RN Treatment Plan for Primary Diagnosis: Schizoaffective disorder, bipolar type (HCC) Long Term Goal(s): Knowledge of disease and therapeutic regimen to maintain health will improve  Short Term Goals: Ability to verbalize frustration and anger appropriately will improve, Ability to demonstrate self-control, Ability to participate in decision making will improve and Ability to verbalize feelings will improve  Medication Management: RN will administer medications as ordered by provider, will assess and evaluate patient's response and provide education to patient for prescribed  medication. RN will report any adverse and/or side effects to prescribing  provider.  Therapeutic Interventions: 1 on 1 counseling [place], Psychoeducation, Medication administration, Evaluate responses to treatment, Monitor vital signs and CBGs as ordered, Perform/monitor CIWA, COWS, AIMS and Fall Risk screenings as ordered, Perform wound care treatments as ordered.  Evaluation of Outcomes: Progressing   LCSW Treatment Plan for Primary Diagnosis: Schizoaffective disorder, bipolar type (HCC) Long Term Goal(s): Safe transition to appropriate next level of care at discharge, Engage patient in therapeutic group addressing interpersonal concerns.  Short Term Goals: Engage patient in aftercare planning with referrals and resources, Increase social support, Increase ability to appropriately verbalize feelings, Increase emotional regulation and Facilitate acceptance of mental health diagnosis and concerns  Therapeutic Interventions: Assess for all discharge needs, 1 to 1 time with Social worker, Explore available resources and support systems, Assess for adequacy in community support network, Educate family and significant other(s) on suicide prevention, Complete Psychosocial Assessment, Interpersonal group therapy.  Evaluation of Outcomes: Progressing   Progress in Treatment: Attending groups: Yes. Participating in groups: Yes. Taking medication as prescribed: Yes.  Patient was placed on forced med order. Toleration medication: Yes. Family/Significant other contact made: Yes, individual(s) contacted:  CSW completed SPE with the patients mother.  Patient understands diagnosis: Yes. Discussing patient identified problems/goals with staff: Yes. Medical problems stabilized or resolved: Yes. Denies suicidal/homicidal ideation: Yes. Issues/concerns per patient self-inventory: No. Other: none  New problem(s) identified: No, Describe:  none  New Short Term/Long Term Goal(s): elimination of symptoms of psychosis, medication management for mood stabilization; elimination  of SI thoughts; development of comprehensive mental wellness plan.  Patient Goals:  "independence"  Discharge Plan or Barriers: Pt has a legal guardian, his mother. Patient behaviors are inconsistent at this time.  Patient has moments of lucidity as well as moments of disorganized thinking.  Patient continues to move toward medication stabilization.    Reason for Continuation of Hospitalization: Aggression Delusions  Hallucinations Mania Medication stabilization  Estimated Length of Stay: 1-5 days  Recreational Therapy: Patient Stressors: N/A Patient Goal: Patient will focus on task/topic with 2 prompts from staff within 5 recreation therapy group [place]  Attendees: Patient: Jackson [name] 11/05/2018 2:38 PM  Physician: Dr. Toni Amend, MD 11/05/2018 2:38 PM  Nursing:  11/05/2018 2:38 PM  RN Care Manager: 11/05/2018 2:38 PM  Social Worker: Penni Homans, MSW, LCSW 11/05/2018 2:38 PM  Recreational Therapist:  11/05/2018 2:38 PM  Other:  11/05/2018 2:38 PM  Other:  11/05/2018 2:38 PM  Other: 11/05/2018 2:38 PM    Scribe for Treatment Team: Harden Mo, LCSW 11/05/2018 2:38 PM

## 2018-11-05 NOTE — B[facility] Group Notes (Signed)
LCSW Group Therapy Note  11/05/2018 1:00 PM  Type of Therapy and Topic:  Group Therapy:  Feelings around Relapse and Recovery  Participation Level:  Minimal   Description of Group:    Patients in this group will discuss emotions they experience before and after a relapse. They will process how experiencing these feelings, or avoidance of experiencing them, relates to having a relapse. Facilitator will guide patients to explore emotions they have related to recovery. Patients will be encouraged to process which emotions are more powerful. They will be guided to discuss the emotional reaction significant others in their lives may have to their relapse or recovery. Patients will be assisted in exploring ways to respond to the emotions of others without this contributing to a relapse.  Therapeutic Goals: 1. Patient will identify two or more emotions that lead to a relapse for them 2. Patient will identify two emotions that result when they relapse 3. Patient will identify two emotions related to recovery 4. Patient will demonstrate ability to communicate their needs through discussion and/or role plays   Summary of Patient Progress: Patient was present in group, however, he was not very talkative and did not participate in discussion.  Patient reported in group "everytime I get into a car I time travel.  I have been a lot of places."  Patient was able to be redirected.    Therapeutic Modalities:   Cognitive Behavioral Therapy Solution-Focused Therapy Assertiveness Training Relapse Prevention Therapy   Penni Homans, MSW, LCSW 11/05/2018 2:26 PM

## 2018-11-05 NOTE — Progress Notes (Signed)
[facility] MD Progress Note  11/05/2018 5:57 PM Jackson [name]  MRN:  [id number] Subjective: Follow-up for this patient with schizoaffective disorder.  Spoke with the patient this afternoon and told him about the concerns from his mother about his ongoing hostile and bizarre behavior.  Interestingly this triggered the patient to become agitated with me.  When off on several minute tangent that was delusional about his company and his money and the vast amount of stuff that he owns.  Got pretty far out there with his flight of ideas. Principal Problem: Schizoaffective disorder, bipolar type (HCC) Diagnosis: Principal Problem:   Schizoaffective disorder, bipolar type (HCC) Active Problems:   Noncompliance  Total Time spent with patient: 30 minutes  Past Psychiatric History: Patient has a past history of extensive hospitalization.  Recent medicine noncompliance  Past Medical History:  Past Medical History:  Diagnosis Date  . Bipolar 1 disorder (HCC)   . Schizoaffective disorder (HCC)    History reviewed. No pertinent surgical history. Family History: History reviewed. No pertinent family history. Family Psychiatric  History: See previous Social History:  Social History   Substance and Sexual Activity  Alcohol Use Not on file     Social History   Substance and Sexual Activity  Drug Use Not on file    Social History   Socioeconomic History  . Marital status: Single    Spouse name: Not on file  . Number of children: Not on file  . Years of education: Not on file  . Highest education level: Not on file  Occupational History  . Not on file  Social Needs  . Financial resource strain: Not on file  . Food insecurity:    Worry: Not on file    Inability: Not on file  . Transportation needs:    Medical: Not on file    Non-medical: Not on file  Tobacco Use  . Smoking status: Light Tobacco Smoker    Packs/day: 0.25    Types: Cigarettes  . Smokeless tobacco: Never Used  Substance and  Sexual Activity  . Alcohol use: Not on file  . Drug use: Not on file  . Sexual activity: Not on file  Lifestyle  . Physical activity:    Days per week: Not on file    Minutes per session: Not on file  . Stress: Not on file  Relationships  . Social connections:    Talks on phone: Not on file    Gets together: Not on file    Attends religious service: Not on file    Active member of club or organization: Not on file    Attends meetings of clubs or organizations: Not on file    Relationship status: Not on file  Other Topics Concern  . Not on file  Social History Narrative  . Not on file   Additional Social History:                         Sleep: Fair  Appetite:  Fair  Current Medications: Current Facility-Administered Medications  Medication Dose Route Frequency Provider Last Rate Last Dose  . acetaminophen (TYLENOL) tablet 650 mg  650 mg Oral Q6H PRN [name], MD   650 mg at 10/26/18 2302  . alum & mag hydroxide-simeth (MAALOX/MYLANTA) 200-200-20 MG/5ML suspension 30 mL  30 mL Oral Q4H PRN [name], MD   30 mL at 10/26/18 2129  . ARIPiprazole (ABILIFY) tablet 15 mg  15 mg Oral  Daily Dezmen Alcock, [name], MD   15 mg at 11/05/18 0813  . divalproex (DEPAKOTE) DR tablet 1,000 mg  1,000 mg Oral Q12H Brihanna Devenport T, MD   1,000 mg at 11/05/18 0811  . hydrOXYzine (ATARAX/VISTARIL) tablet 50 mg  50 mg Oral Q4H PRN [name], MD   50 mg at 10/28/18 1478  . magnesium hydroxide (MILK OF MAGNESIA) suspension 30 mL  30 mL Oral Daily PRN [name], MD      . OLANZapine [facility]) injection 10 mg  10 mg Intramuscular Q6H PRN Sven Pinheiro T, MD      . OLANZapine (ZYPREXA) tablet 15 mg  15 mg Oral Q12H Jearlean Demauro, [name], MD   15 mg at 11/05/18 2956  . traZODone (DESYREL) tablet 100 mg  100 mg Oral QHS PRN [name], MD   100 mg at 10/26/18 0034    Lab Results: No results found for this or any previous visit (from the past 48  hour(s)).  Blood Alcohol level:  Lab Results  Component Value Date   ETH <10 10/16/2018    Metabolic Disorder Labs: Lab Results  Component Value Date   HGBA1C 5.2 10/23/2018   MPG 102.54 10/23/2018   No results found for: PROLACTIN Lab Results  Component Value Date   CHOL 129 10/23/2018   TRIG 58 10/23/2018   HDL 50 10/23/2018   CHOLHDL 2.6 10/23/2018   VLDL 12 10/23/2018   LDLCALC 67 10/23/2018    Physical Findings: AIMS: Facial and Oral Movements Muscles of Facial Expression: None, normal Lips and Perioral Area: None, normal Jaw: None, normal Tongue: None, normal,Extremity Movements Upper (arms, wrists, hands, fingers): None, normal Lower (legs, knees, ankles, toes): None, normal, Trunk Movements Neck, shoulders, hips: None, normal, Overall Severity Severity of abnormal movements (highest score from questions above): None, normal Incapacitation due to abnormal movements: None, normal Patient's awareness of abnormal movements (rate only patient's report): No Awareness, Dental Status Current problems with teeth and/or dentures?: No Does patient usually wear dentures?: No  CIWA:  CIWA-Ar Total: 0 COWS:  COWS Total Score: 0  Musculoskeletal: Strength & Muscle Tone: within normal limits Gait & Station: normal Patient leans: N/A  Psychiatric Specialty Exam: Physical Exam  Nursing note and vitals reviewed. Constitutional: He appears well-developed and well-nourished.  HENT:  Head: Normocephalic and atraumatic.  Eyes: Pupils are equal, round, and reactive to light. Conjunctivae are normal.  Neck: Normal range of motion.  Cardiovascular: Regular rhythm and normal heart sounds.  Respiratory: Effort normal. No respiratory distress.  GI: Soft.  Musculoskeletal: Normal range of motion.  Neurological: He is alert.  Skin: Skin is warm and dry.  Psychiatric: His affect is angry and labile. His speech is rapid and/or pressured and tangential. He is agitated. He is not  aggressive. Thought content is paranoid and delusional. Cognition and memory are impaired. He expresses impulsivity.    Review of Systems  Constitutional: Negative.   HENT: Negative.   Eyes: Negative.   Respiratory: Negative.   Cardiovascular: Negative.   Gastrointestinal: Negative.   Musculoskeletal: Negative.   Skin: Negative.   Neurological: Negative.   Psychiatric/Behavioral: Negative for depression, hallucinations, memory loss, substance abuse and suicidal ideas. The patient is not nervous/anxious and does not have insomnia.     Blood pressure (!) 126/92, pulse 84, temperature 97.9 F (36.6 C), temperature source Oral, resp. rate 17, height  (1.803 m), weight 88.5 kg, SpO2 100 %.Body mass index is 27.2 kg/m.  General Appearance:  Casual  Eye Contact:  Fair  Speech:  Pressured  Volume:  Increased  Mood:  Euphoric and Irritable  Affect:  Labile  Thought Process:  Disorganized  Orientation:  Full (Time, Place, and Person)  Thought Content:  Illogical, Delusions and Paranoid Ideation  Suicidal Thoughts:  No  Homicidal Thoughts:  No  Memory:  Immediate;   Fair Recent;   Poor Remote;   Poor  Judgement:  Impaired  Insight:  Shallow  Psychomotor Activity:  Increased  Concentration:  Concentration: Poor  Recall:  Poor  Fund of Knowledge:  Fair  Language:  Fair  Akathisia:  No  Handed:  Right  AIMS (if indicated):     Assets:  Architect Social Support  ADL's:  Impaired  Cognition:  WNL  Sleep:  Number of Hours: 6     Treatment Plan Summary: Daily contact with patient to assess and evaluate symptoms and progress in treatment, Medication management and Plan Continue on antipsychotic and mood stabilizer.  Patient has been referred to [facility]  Requested new commitment time.  Did some psychoeducation and review of treatment plan with patient.  Mordecai Rasmussen, MD 11/05/2018, 5:57 PM

## 2018-11-05 NOTE — Plan of Care (Signed)
Pt says he slept. Pt denies SI, HI, hallucinations, depression and anxiety. Pt states goal is "trying to stay awake today." The estimated sleep hours sheet says that he slept six hours. Pt was educated on care plan and understood. Torrie Mayers RN Problem: Education: Goal: Knowledge of [facility] Education information/materials will improve Outcome: Progressing   Problem: Coping: Goal: Ability to verbalize frustrations and anger appropriately will improve Outcome: Progressing Goal: Ability to demonstrate self-control will improve Outcome: Progressing   Problem: Safety: Goal: Periods of time without injury will increase Outcome: Progressing   Problem: Education: Goal: Will be free of psychotic symptoms Outcome: Progressing Goal: Knowledge of the prescribed therapeutic regimen will improve Outcome: Progressing   Problem: Self-Concept: Goal: Will verbalize positive feelings about self Outcome: Progressing   Problem: Education: Goal: Ability to state activities that reduce stress will improve Outcome: Progressing

## 2018-11-05 NOTE — Plan of Care (Signed)
Patient is exhibiting disorganized behaviors and thinking  that is often incongruent with situation,, patient will swing his hands to the left or to the right and expect you to see something that he characterized as magical and in reality there is nothing there , one can only conclude as disturbed perceptions,  his PM medication is given and taken , patient denies depressions and anxiety, denies suicidal, homicidal ideations and expresses no physical complains , patient eating well and hydrating with fluids and juices, patient contract for safety, sleep is continuous and monitored every 15 minutes for safety.   Problem: Education: Goal: Knowledge of [facility] Education information/materials will improve Outcome: Progressing   Problem: Coping: Goal: Ability to verbalize frustrations and anger appropriately will improve Outcome: Progressing Goal: Ability to demonstrate self-control will improve Outcome: Progressing   Problem: Safety: Goal: Periods of time without injury will increase Outcome: Progressing   Problem: Education: Goal: Will be free of psychotic symptoms Outcome: Progressing Goal: Knowledge of the prescribed therapeutic regimen will improve Outcome: Progressing   Problem: Self-Concept: Goal: Will verbalize positive feelings about self Outcome: Progressing   Problem: Education: Goal: Ability to state activities that reduce stress will improve Outcome: Progressing

## 2018-11-05 NOTE — Progress Notes (Signed)
Recreation Therapy Notes  Date: 11/05/2018  Time: 9:30 am   Location: Craft room   Behavioral response: N/A   Intervention Topic: Leisure  Discussion/Intervention: Patient did not attend group.   Clinical Observations/Feedback:  Patient did not attend group.   [name] LRT/CTRS        Jackson [name] 11/05/2018 11:09 AM 

## 2018-11-05 NOTE — Progress Notes (Signed)
D: Pt. During assessments denies si/hi/avh, contracts for safety. Pt. Denies everything (anxiety or depression), reports his mood as, "perfect". Pt. 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 Pt. Continues to express somatic complaints.   A: Q x 15 minute observation checks were completed for safety. Patient was provided with education, but is non-accepting. Patient was given/offered medications per orders. Patient  was encourage to attend groups, participate in unit activities and continue with plan of care. Pt. Chart and plans of care reviewed. Pt. Given support and encouragement.   R: Patient is complaint with medications, but continues to argue about having to take his medications and is still reluctant to take them. Pt. Eating good, goes to snack and group. Pt. Mostly appropriate this evening, but did posture towards another patient that was irritating him. Pt. Was redirectable during incident with peer. Pt. Observed often socializing with peers and playing cards.             Precautionary checks every 15 minutes for safety maintained, room free of safety hazards, patient sustains no injury or falls during this shift. Will endorse care to next shift.

## 2018-11-06 NOTE — Progress Notes (Signed)
D: Pt during assessments denies SI/HI/AVH, contracts for safety. Pt is mostly pleasant and cooperative, easy to redirect so far. Pt. Continues to present with bizarre behaviors, somatic complaints, and delusions of owning businesses. Pt. Denies pain. Pt. Endorses a mostly normal mood. Pt. Can be labile in his behaviors observed around the unit.   A: Q x 15 minute observation checks to be completed for safety. Patient was provided with education, but is non-accepting of this. Patient was given/offered medications per orders. Patient  was encourage to attend groups, participate in unit activities and continue with plan of care. Pt. Chart and plans of care reviewed. Pt. Given support and encouragement.   R: Patient is complaint with medications and unit procedures this morning, but continues to be reluctant to take his medications. Pt. Continues to Present with magical thinking, demonstrated by the patient setting his medication on the table and placing his finger on each one of the pills individually while whispering to himself. Patient when asked what he was doing states, "deactivated the medications with a spell bro". Pt. Eating good, attends meals.

## 2018-11-06 NOTE — Plan of Care (Signed)
Pt. This morning has shown improved self-control, behaviors are more appropriate with peers and staff overall. Pt. Denies si/hi/avh, can contract for safety. Pt. Continues to present tangential, delusion, and with magical thinking.    Problem: Education: Goal: Will be free of psychotic symptoms Outcome: Not Progressing   Problem: Coping: Goal: Ability to demonstrate self-control will improve Outcome: Progressing   Problem: Safety: Goal: Periods of time without injury will increase Outcome: Progressing

## 2018-11-06 NOTE — Progress Notes (Signed)
[facility] MD Progress Note  11/06/2018 12:08 PM Jackson [name]  MRN:  [id number] Subjective:  32yo M, who presents to [facility] inpatient psych unit due to exacerbation of Schizoaffective disorder, particulary mania and psychosis.  Patient seen, chart reviewed.  Patient was seen by this writer two weeks ago. He appears to be improved significantly since. Patient is calm, cooperative, he participates in unit activities, interactive with staff and other patients, his behavior is appropriate. He reports "I am doing good", "I take my medications", "I am ready for discharge". He reports good mood, denies feeling depressed, anxious. He appears still grandiose somewhat and his insight and judgement are limited - "My mother just my guardian, I can live completely fine on my own". He reports good night sleep and good appetite, denies any mental or physical complaints. No thoughts of harming self or others.   Principal Problem: Schizoaffective disorder, bipolar type (HCC) Diagnosis: Principal Problem:   Schizoaffective disorder, bipolar type (HCC) Active Problems:   Noncompliance  Total Time spent with patient: 15 minutes  Past Psychiatric History: see admission H&P  Past Medical History:  Past Medical History:  Diagnosis Date  . Bipolar 1 disorder (HCC)   . Schizoaffective disorder (HCC)    History reviewed. No pertinent surgical history. Family History: History reviewed. No pertinent family history. Family Psychiatric  History: as per admission H&P Social History:  Social History   Substance and Sexual Activity  Alcohol Use Not on file     Social History   Substance and Sexual Activity  Drug Use Not on file    Social History   Socioeconomic History  . Marital status: Single    Spouse name: Not on file  . Number of children: Not on file  . Years of education: Not on file  . Highest education level: Not on file  Occupational History  . Not on file  Social Needs  . Financial resource  strain: Not on file  . Food insecurity:    Worry: Not on file    Inability: Not on file  . Transportation needs:    Medical: Not on file    Non-medical: Not on file  Tobacco Use  . Smoking status: Light Tobacco Smoker    Packs/day: 0.25    Types: Cigarettes  . Smokeless tobacco: Never Used  Substance and Sexual Activity  . Alcohol use: Not on file  . Drug use: Not on file  . Sexual activity: Not on file  Lifestyle  . Physical activity:    Days per week: Not on file    Minutes per session: Not on file  . Stress: Not on file  Relationships  . Social connections:    Talks on phone: Not on file    Gets together: Not on file    Attends religious service: Not on file    Active member of club or organization: Not on file    Attends meetings of clubs or organizations: Not on file    Relationship status: Not on file  Other Topics Concern  . Not on file  Social History Narrative  . Not on file   Additional Social History:                         Sleep: Fair  Appetite:  Good  Current Medications: Current Facility-Administered Medications  Medication Dose Route Frequency Provider Last Rate Last Dose  . acetaminophen (TYLENOL) tablet 650 mg  650 mg Oral Q6H PRN Katheran Awe  C, MD   650 mg at 10/26/18 2302  . alum & mag hydroxide-simeth (MAALOX/MYLANTA) 200-200-20 MG/5ML suspension 30 mL  30 mL Oral Q4H PRN Terance HartStephens, [name], MD   30 mL at 10/26/18 2129  . ARIPiprazole (ABILIFY) tablet 15 mg  15 mg Oral Daily [name], Jackquline DenmarkJohn T, MD   15 mg at 11/06/18 0806  . divalproex (DEPAKOTE) DR tablet 1,000 mg  1,000 mg Oral Q12H [name], [name], MD   1,000 mg at 11/06/18 0807  . hydrOXYzine (ATARAX/VISTARIL) tablet 50 mg  50 mg Oral Q4H PRN Terance HartStephens, [name], MD   50 mg at 10/28/18 [id number]  . magnesium hydroxide (MILK OF MAGNESIA) suspension 30 mL  30 mL Oral Daily PRN Terance HartStephens, [name], MD      . OLANZapine [facility](ZYPREXA) injection 10 mg  10 mg Intramuscular Q6H PRN [name], John  T, MD      . OLANZapine (ZYPREXA) tablet 15 mg  15 mg Oral Q12H [name], Jackquline DenmarkJohn T, MD   15 mg at 11/06/18 0807  . traZODone (DESYREL) tablet 100 mg  100 mg Oral QHS PRN Terance HartStephens, [name], MD   100 mg at 10/26/18 0034    Lab Results: No results found for this or any previous visit (from the past 48 hour(s)).  Blood Alcohol level:  Lab Results  Component Value Date   ETH <10 10/16/2018    Metabolic Disorder Labs: Lab Results  Component Value Date   HGBA1C 5.2 10/23/2018   MPG 102.54 10/23/2018   No results found for: PROLACTIN Lab Results  Component Value Date   CHOL 129 10/23/2018   TRIG 58 10/23/2018   HDL 50 10/23/2018   CHOLHDL 2.6 10/23/2018   VLDL 12 10/23/2018   LDLCALC 67 10/23/2018    Physical Findings: AIMS: Facial and Oral Movements Muscles of Facial Expression: None, normal Lips and Perioral Area: None, normal Jaw: None, normal Tongue: None, normal,Extremity Movements Upper (arms, wrists, hands, fingers): None, normal Lower (legs, knees, ankles, toes): None, normal, Trunk Movements Neck, shoulders, hips: None, normal, Overall Severity Severity of abnormal movements (highest score from questions above): None, normal Incapacitation due to abnormal movements: None, normal Patient's awareness of abnormal movements (rate only patient's report): No Awareness, Dental Status Current problems with teeth and/or dentures?: No Does patient usually wear dentures?: No  CIWA:  CIWA-Ar Total: 0 COWS:  COWS Total Score: 0  Musculoskeletal: Strength & Muscle Tone: within normal limits Gait & Station: normal Patient leans: N/A  Psychiatric Specialty Exam: Physical Exam  ROS  Blood pressure 125/66, pulse 88, temperature (!) 97 F (36.1 C), temperature source Oral, resp. rate 17, height 5\' 11"  (1.803 m), weight 88.5 kg, SpO2 100 %.Body mass index is 27.2 kg/m.  General Appearance: Casual  Eye Contact:  Good  Speech:  Pressured moderately  Volume:  Normal  Mood:   Euphoric  Affect:  Appropriate  Thought Process:  Coherent  Orientation:  Full (Time, Place, and Person)  Thought Content:  Delusions  Suicidal Thoughts:  No  Homicidal Thoughts:  No  Memory:  Immediate;   Fair Recent;   Fair Remote;   Fair  Judgement:  Poor  Insight:  Lacking  Psychomotor Activity:  Normal  Concentration:  Concentration: Poor and Attention Span: Poor  Recall:  Poor  Fund of Knowledge:  Fair  Language:  Fair  Akathisia:  No  Handed:  Right  AIMS (if indicated):     Assets:  Physical Health  ADL's:  Intact  Cognition:  Impaired,  Mild  Sleep:  Number of Hours: 6.25     Treatment Plan Summary: Daily contact with patient to assess and evaluate symptoms and progress in treatment  32yo M, who presents to [facility] inpatient psych unit due to exacerbation of Schizoaffective disorder, particulary mania and psychosis. Patient was seen by this writer two weeks ago. He appears to be improved significantly since. Patient is calm, cooperative, he participates in unit activities, interactive with staff and other patients, his behavior is appropriate. He denies any mental or physical complaints. No thoughts of harming self or others. He appears still grandiose somewhat. His insight and judgement are limited. No indication for medication changes today.  Impression: Schizoaffective disorder, bipolar type, current episode - acute mania.  Plan: -continue inpatient psych admission; 15-minute checks; daily contact with patient to assess and evaluate symptoms and progress in treatment; psychoeducation.   -continue scheduled psych medications: Abilify 15mg  PO BID, Zyprexa 15mg  PO BID, Depakote 100mg  PO BID. This is not ideal to keep the patient on two antipsychotics, but it appears to be necessary for now to control patrient's psychosis.   -continue PRN medications.   -Disposition: to be determined.   Thalia Party, MD 11/06/2018, 12:08 PM

## 2018-11-06 NOTE — [facility] Group Notes (Addendum)
LCSW Group Therapy Note  11/06/2018 1:15pm  Type of Therapy and Topic:  Group Therapy:  Healthy Self Image and Positive Change  Participation Level:  Active   Description of Group:  In this group, patients will compare and contrast their current "I am...." statements to the visions they identify as desirable for their lives.  Patients discuss fears and how they can make positive changes in their cognitions that will positively impact their behaviors.  Facilitator played a motivational 3-minute speech and patients were left with the task of thinking about what "I am...." statements they can start using in their lives immediately.  Therapeutic Goals: 1. Patient will state their current self-perception as expressed in an "I Am" statement 2. Patient will contrast this with their desired vision for their live 3. Patient will identify 3 fears that negatively impact their behavior 4. Patient will discuss cognitive distortions that stem from their fears 5. Patient will verbalize statements that challenge their cognitive distortions  Summary of Patient Progress:  The patient reported that he feels "exhausted." The patient stated, "I am realistically irrational." Patient discussed his fears and how he can make positive changes in their cognitions that will positively impact his behaviors. Patient was able to discuss and process cognitive distortions that stem from his fears. Patient actively and appropriately engaged in the group. Patient was able to provide support and validation to other group members. Patient practiced active listening when interacting with the facilitator and other group members.    Therapeutic Modalities Cognitive Behavioral Therapy Motivational Interviewing  Johnnye Sima, LCSW 11/06/2018 12:32 PM

## 2018-11-07 NOTE — [facility] Group Notes (Signed)
LCSW Group Therapy Note 11/07/2018 1:15pm  Type of Therapy and Topic: Group Therapy: Feelings Around Returning Home & Establishing a Supportive Framework and Supporting Oneself When Supports Not Available  Participation Level: Did Not Attend  Description of Group:  Patients first processed thoughts and feelings about upcoming discharge. These included fears of upcoming changes, lack of change, new living environments, judgements and expectations from others and overall stigma of mental health issues. The group then discussed the definition of a supportive framework, what that looks and feels like, and how do to discern it from an unhealthy non-supportive network. The group identified different types of supports as well as what to do when your family/friends are less than helpful or unavailable  Therapeutic Goals  1. Patient will identify one healthy supportive network that they can use at discharge. 2. Patient will identify one factor of a supportive framework and how to tell it from an unhealthy network. 3. Patient able to identify one coping skill to use when they do not have positive supports from others. 4. Patient will demonstrate ability to communicate their needs through discussion and/or role plays.  Summary of Patient Progress:  Pt was invited to group but did not attend.    Therapeutic Modalities Cognitive Behavioral Therapy Motivational Interviewing   Victorian Gunn  [name], LCSW 11/07/2018 9:33 AM  

## 2018-11-07 NOTE — Plan of Care (Signed)
D: At the beginning of the shift, patient was in bed, isolative to room, and appeared depressed, stating "all I do is take  medication and sleep". Later, he got up and his affect brightened and his mood appeared to improve. He stated "I'm going to give you a gift. I'm going to fix your equilibrium. Now which way do you want to spin?" and he made circular motions with his hand and asked "can you see it?" Then he said, "I'm going to give you eternal life, how old do you want to be?" He then spun around, swung his leg out and motioned with his hands and said "now you are 33 years old again, but you will age back in about a week". Patient denies SI, HI and AV hallucinations. Medication compliant. Calm and cooperative. Requesting additional food because he says the medication makes him hungry. A: Continue to monitor for safety and offer support. R: Safety maintained. 

## 2018-11-07 NOTE — Progress Notes (Signed)
[facility] MD Progress Note  11/07/2018 9:46 AM Jackson [name]  MRN:  [id number] Subjective:    32yo M, who presents to [facility] inpatient psych unit due to exacerbation of Schizoaffective disorder, particulary mania and psychosis.  Patient seen, chart reviewed. Patient is calm, cooperative, participates in unit activities, interactive with staff and other patients, his behavior is appropriate.  He reports "I am fine, doc", "I take my meds", "I am ready for discharge", "I want to go back to a group home". He reports good mood, denies feeling depressed, anxious, reports good night sleep, good appetite, denies any mental or physical complaints; denies thoughts of harming self or others. s. He appears grandiose somewhat. "I can decide where to go. I don`t live with my mother sine I was 12. I was living in group homes and I want to get back there".   Principal Problem: Schizoaffective disorder, bipolar type (HCC) Diagnosis: Principal Problem:   Schizoaffective disorder, bipolar type (HCC) Active Problems:   Noncompliance  Total Time spent with patient: 15 minutes  Past Psychiatric History: see H&P  Past Medical History:  Past Medical History:  Diagnosis Date  . Bipolar 1 disorder (HCC)   . Schizoaffective disorder (HCC)    History reviewed. No pertinent surgical history. Family History: History reviewed. No pertinent family history. Family Psychiatric  History: see H&P Social History:  Social History   Substance and Sexual Activity  Alcohol Use Not on file     Social History   Substance and Sexual Activity  Drug Use Not on file    Social History   Socioeconomic History  . Marital status: Single    Spouse name: Not on file  . Number of children: Not on file  . Years of education: Not on file  . Highest education level: Not on file  Occupational History  . Not on file  Social Needs  . Financial resource strain: Not on file  . Food insecurity:    Worry: Not on file   Inability: Not on file  . Transportation needs:    Medical: Not on file    Non-medical: Not on file  Tobacco Use  . Smoking status: Light Tobacco Smoker    Packs/day: 0.25    Types: Cigarettes  . Smokeless tobacco: Never Used  Substance and Sexual Activity  . Alcohol use: Not on file  . Drug use: Not on file  . Sexual activity: Not on file  Lifestyle  . Physical activity:    Days per week: Not on file    Minutes per session: Not on file  . Stress: Not on file  Relationships  . Social connections:    Talks on phone: Not on file    Gets together: Not on file    Attends religious service: Not on file    Active member of club or organization: Not on file    Attends meetings of clubs or organizations: Not on file    Relationship status: Not on file  Other Topics Concern  . Not on file  Social History Narrative  . Not on file   Additional Social History:                         Sleep: Fair  Appetite:  Fair  Current Medications: Current Facility-Administered Medications  Medication Dose Route Frequency Provider Last Rate Last Dose  . acetaminophen (TYLENOL) tablet 650 mg  650 mg Oral Q6H PRN [name], MD   [phone]  mg at 10/26/18 2302  . alum & mag hydroxide-simeth (MAALOX/MYLANTA) 200-200-20 MG/5ML suspension 30 mL  30 mL Oral Q4H PRN [name], MD   30 mL at 10/26/18 2129  . ARIPiprazole (ABILIFY) tablet 15 mg  15 mg Oral Daily [name], [name], MD   15 mg at 11/07/18 0916  . divalproex (DEPAKOTE) DR tablet 1,000 mg  1,000 mg Oral Q12H [name], [name], MD   1,000 mg at 11/07/18 0916  . hydrOXYzine (ATARAX/VISTARIL) tablet 50 mg  50 mg Oral Q4H PRN [name], MD   50 mg at 10/28/18 3254  . magnesium hydroxide (MILK OF MAGNESIA) suspension 30 mL  30 mL Oral Daily PRN [name], MD      . OLANZapine [facility]) injection 10 mg  10 mg Intramuscular Q6H PRN [name], [name], MD      . OLANZapine (ZYPREXA) tablet 15 mg  15 mg Oral Q12H  [name], [name], MD   15 mg at 11/07/18 0916  . traZODone (DESYREL) tablet 100 mg  100 mg Oral QHS PRN [name], MD   100 mg at 10/26/18 0034    Lab Results: No results found for this or any previous visit (from the past 48 hour(s)).  Blood Alcohol level:  Lab Results  Component Value Date   ETH <10 10/16/2018    Metabolic Disorder Labs: Lab Results  Component Value Date   HGBA1C 5.2 10/23/2018   MPG 102.54 10/23/2018   No results found for: PROLACTIN Lab Results  Component Value Date   CHOL 129 10/23/2018   TRIG 58 10/23/2018   HDL 50 10/23/2018   CHOLHDL 2.6 10/23/2018   VLDL 12 10/23/2018   LDLCALC 67 10/23/2018    Physical Findings: AIMS: Facial and Oral Movements Muscles of Facial Expression: None, normal Lips and Perioral Area: None, normal Jaw: None, normal Tongue: None, normal,Extremity Movements Upper (arms, wrists, hands, fingers): None, normal Lower (legs, knees, ankles, toes): None, normal, Trunk Movements Neck, shoulders, hips: None, normal, Overall Severity Severity of abnormal movements (highest score from questions above): None, normal Incapacitation due to abnormal movements: None, normal Patient's awareness of abnormal movements (rate only patient's report): No Awareness, Dental Status Current problems with teeth and/or dentures?: No Does patient usually wear dentures?: No  CIWA:  CIWA-Ar Total: 0 COWS:  COWS Total Score: 0  Musculoskeletal: Strength & Muscle Tone: within normal limits Gait & Station: normal Patient leans: N/A  Psychiatric Specialty Exam: Physical Exam  ROS  Blood pressure 120/82, pulse 81, temperature 98.5 F (36.9 C), temperature source Oral, resp. rate 18, height 5\' 11"  (1.803 m), weight 88.5 kg, SpO2 100 %.Body mass index is 27.2 kg/m.  General Appearance: appropriate  Eye Contact:  Good  Speech:  Normal Rate  Volume:  Normal  Mood:  Euthymic  Affect:  Congruent  Thought Process:  Coherent  Orientation:   Full (Time, Place, and Person)  Thought Content:  WDL  Suicidal Thoughts:  No  Homicidal Thoughts:  No  Memory:  Immediate;   Fair Recent;   Fair Remote;   Fair  Judgement:  Other:  limited  Insight:  limited  Psychomotor Activity:  Normal  Concentration:  Concentration: Fair and Attention Span: Fair  Recall:  [employer] of Knowledge:  Fair  Language:  Fair  Akathisia:  No  Handed:  Right  AIMS (if indicated):     Assets:  Desire for Improvement  ADL's:  Intact  Cognition:  Impaired,  Mild  Sleep:  Number of Hours: 3.5     Treatment Plan Summary: Daily contact with patient to assess and evaluate symptoms and progress in treatment   32yo M, who presents to [facility] inpatient psych unit due to exacerbation of Schizoaffective disorder, particulary mania and psychosis. Patient was seen by this writer two weeks ago. He appears to be improved significantly since. Patient is calm, cooperative, he participates in unit activities, interactive with staff and other patients, his behavior is appropriate. He denies any mental or physical complaints. No thoughts of harming self or others. He appears still grandiose somewhat. His insight and judgement are limited. No indication for medication changes today.  Impression: Schizoaffective disorder, bipolar type, current episode - acute mania.  Plan: -continue inpatient psych admission; 15-minute checks; daily contact with patient to assess and evaluate symptoms and progress in treatment; psychoeducation.   -continue scheduled psych medications: Abilify  PO BID, Zyprexa  PO BID, Depakote  PO BID. This is not ideal to keep the patient on two antipsychotics, but it appears to be necessary for now to control patrient's psychosis.   -continue PRN medications.   -Disposition: to be determined.   Thalia Party, MD 11/07/2018, 9:46 AM

## 2018-11-07 NOTE — Progress Notes (Signed)
D: At the beginning of the shift, patient was in bed, isolative to room, and appeared depressed, stating "all I do is take  medication and sleep". Later, he got up and his affect brightened and his mood appeared to improve. He stated "I'm going to give you a gift. I'm going to fix your equilibrium. Now which way do you want to spin?" and he made circular motions with his hand and asked "can you see it?" Then he said, "I'm going to give you eternal life, how old do you want to be?" He then spun around, swung his leg out and motioned with his hands and said "now you are 33 years old again, but you will age back in about a week". Patient denies SI, HI and AV hallucinations. Medication compliant. Calm and cooperative. Requesting additional food because he says the medication makes him hungry. A: Continue to monitor for safety and offer support. R: Safety maintained.

## 2018-11-07 NOTE — Plan of Care (Signed)
D- Patient alert and oriented. Patient presents in a pleasant mood on assessment stating that he slept "pretty good" last night and had no major complaints to voice to this Clinical research associate. Patient continues his magical thinking stating that he took this writer back to the age of 63 and that I'm going to start "aging backwards, I just gave you eternal life". Patient also stated that he created a "hologram on my arm, can't you see it" and "I have a halo and this is my third-seeing eye". Patient denies any signs/symptoms of depression and anxiety to this Clinical research associate. Patient also denies SI, HI, AVH, and pain at this time. Patient's goal for today is to "stay gucci".  A- Scheduled medications administered to patient, per MD orders. Support and encouragement provided.  Routine safety checks conducted every 15 minutes.  Patient informed to notify staff with problems or concerns.  R- No adverse drug reactions noted. Patient contracts for safety at this time. Patient compliant with medications and treatment plan. Patient receptive, calm, and cooperative. Patient interacts well with others on the unit.  Patient remains safe at this time.  Problem: Education: Goal: Knowledge of [facility] Education information/materials will improve Outcome: Not Progressing   Problem: Coping: Goal: Ability to verbalize frustrations and anger appropriately will improve Outcome: Not Progressing Goal: Ability to demonstrate self-control will improve Outcome: Not Progressing   Problem: Safety: Goal: Periods of time without injury will increase Outcome: Not Progressing   Problem: Education: Goal: Will be free of psychotic symptoms Outcome: Not Progressing Goal: Knowledge of the prescribed therapeutic regimen will improve Outcome: Not Progressing   Problem: Self-Concept: Goal: Will verbalize positive feelings about self Outcome: Not Progressing   Problem: Education: Goal: Ability to state activities that reduce  stress will improve Outcome: Not Progressing

## 2018-11-08 NOTE — Plan of Care (Signed)
D- Patient alert and oriented. Patient presents in a pleasant mood on assessment stating that he slept "pretty good" last night and had no major complaints to voice to this Clinical research associate. Patient continues his magical thinking stating to this writer that "I want you to see my angel, he's right here". Patient also stated that he wanted this writer to see "the rainbow right here" with him pointing mid air. Patient denies any signs/symptoms of depression and anxiety to this Clinical research associate. Patient also denies SI, HI, AVH, and pain at this time, although it is very apparent that he is hallucinating. Patient had no stated goals for today.  A- Scheduled medications administered to patient, per MD orders. Support and encouragement provided.  Routine safety checks conducted every 15 minutes.  Patient informed to notify staff with problems or concerns.  R- No adverse drug reactions noted. Patient contracts for safety at this time. Patient compliant with medications and treatment plan. Patient receptive, calm, and cooperative. Patient interacts well with others on the unit.  Patient remains safe at this time.  Problem: Education: Goal: Knowledge of [place] [facility] Education information/materials will improve Outcome: Progressing   Problem: Coping: Goal: Ability to verbalize frustrations and anger appropriately will improve Outcome: Progressing Goal: Ability to demonstrate self-control will improve Outcome: Progressing   Problem: Safety: Goal: Periods of time without injury will increase Outcome: Progressing   Problem: Education: Goal: Will be free of psychotic symptoms Outcome: Progressing Goal: Knowledge of the prescribed therapeutic regimen will improve Outcome: Progressing   Problem: Self-Concept: Goal: Will verbalize positive feelings about self Outcome: Progressing   Problem: Education: Goal: Ability to state activities that reduce stress will improve Outcome: Progressing

## 2018-11-08 NOTE — [facility] Group Notes (Signed)
Overcoming Obstacles  11/08/2018 1PM  Type of Therapy and Topic:  Group Therapy:  Overcoming Obstacles  Participation Level:  Did Not Attend    Description of Group:    In this group patients will be encouraged to explore what they see as obstacles to their own wellness and recovery. They will be guided to discuss their thoughts, feelings, and behaviors related to these obstacles. The group will process together ways to cope with barriers, with attention given to specific choices patients can make. Each patient will be challenged to identify changes they are motivated to make in order to overcome their obstacles. This group will be process-oriented, with patients participating in exploration of their own experiences as well as giving and receiving support and challenge from other group members.   Therapeutic Goals: 1. Patient will identify personal and current obstacles as they relate to admission. 2. Patient will identify barriers that currently interfere with their wellness or overcoming obstacles.  3. Patient will identify feelings, thought process and behaviors related to these barriers. 4. Patient will identify two changes they are willing to make to overcome these obstacles:      Summary of Patient Progress     Therapeutic Modalities:   Cognitive Behavioral Therapy Solution Focused Therapy Motivational Interviewing Relapse Prevention Therapy    Lowella Dandy, MSW, LCSW 11/08/2018 1:58 PM

## 2018-11-08 NOTE — Plan of Care (Signed)
D: Patient continues to be delusional, giving various staff members "eternal life". Patient refused his Depakote because he says it is making him gain too much weight. Mood is pleasant. Affect is bright and animated. Denies SI, HI and AV hallucinations. A: Continue to monitor for safety and offer support. R: Safety maintained.

## 2018-11-08 NOTE — Progress Notes (Signed)
[facility] MD Progress Note  11/08/2018 5:04 PM Jackson [name]  MRN:  [id number] Subjective: Follow-up for this patient with schizoaffective disorder.  Patient remains paranoid disorganized delusional.  Has not however been disruptive or aggressive.  Tolerating medicine adequately.  No new physical complaints Principal Problem: Schizoaffective disorder, bipolar type (HCC) Diagnosis: Principal Problem:   Schizoaffective disorder, bipolar type (HCC) Active Problems:   Noncompliance  Total Time spent with patient: 20 minutes  Past Psychiatric History: Past history of bipolar disorder or schizoaffective disorder with some extended hospitalizations in the past  Past Medical History:  Past Medical History:  Diagnosis Date  . Bipolar 1 disorder (HCC)   . Schizoaffective disorder (HCC)    History reviewed. No pertinent surgical history. Family History: History reviewed. No pertinent family history. Family Psychiatric  History: See previous Social History:  Social History   Substance and Sexual Activity  Alcohol Use Not on file     Social History   Substance and Sexual Activity  Drug Use Not on file    Social History   Socioeconomic History  . Marital status: Single    Spouse name: Not on file  . Number of children: Not on file  . Years of education: Not on file  . Highest education level: Not on file  Occupational History  . Not on file  Social Needs  . Financial resource strain: Not on file  . Food insecurity:    Worry: Not on file    Inability: Not on file  . Transportation needs:    Medical: Not on file    Non-medical: Not on file  Tobacco Use  . Smoking status: Light Tobacco Smoker    Packs/day: 0.25    Types: Cigarettes  . Smokeless tobacco: Never Used  Substance and Sexual Activity  . Alcohol use: Not on file  . Drug use: Not on file  . Sexual activity: Not on file  Lifestyle  . Physical activity:    Days per week: Not on file    Minutes per session: Not on file   . Stress: Not on file  Relationships  . Social connections:    Talks on phone: Not on file    Gets together: Not on file    Attends religious service: Not on file    Active member of club or organization: Not on file    Attends meetings of clubs or organizations: Not on file    Relationship status: Not on file  Other Topics Concern  . Not on file  Social History Narrative  . Not on file   Additional Social History:                         Sleep: Fair  Appetite:  Fair  Current Medications: Current Facility-Administered Medications  Medication Dose Route Frequency Provider Last Rate Last Dose  . acetaminophen (TYLENOL) tablet 650 mg  650 mg Oral Q6H PRN [name], MD   650 mg at 10/26/18 2302  . alum & mag hydroxide-simeth (MAALOX/MYLANTA) 200-200-20 MG/5ML suspension 30 mL  30 mL Oral Q4H PRN [name], MD   30 mL at 10/26/18 2129  . ARIPiprazole (ABILIFY) tablet 15 mg  15 mg Oral Daily [name], [name], MD   15 mg at 11/08/18 0939  . divalproex (DEPAKOTE) DR tablet 1,000 mg  1,000 mg Oral Q12H [name], [name], MD   1,000 mg at 11/08/18 0939  . hydrOXYzine (ATARAX/VISTARIL) tablet 50 mg  50 mg Oral Q4H PRN Terance HartStephens, [name], MD   50 mg at 10/28/18 [id number]  . magnesium hydroxide (MILK OF MAGNESIA) suspension 30 mL  30 mL Oral Daily PRN Terance HartStephens, [name], MD      . OLANZapine [facility](ZYPREXA) injection 10 mg  10 mg Intramuscular Q6H PRN [name], [name], MD      . OLANZapine (ZYPREXA) tablet 15 mg  15 mg Oral Q12H [name], Jackquline DenmarkJohn T, MD   15 mg at 11/08/18 0939  . traZODone (DESYREL) tablet 100 mg  100 mg Oral QHS PRN Terance HartStephens, [name], MD   100 mg at 10/26/18 0034    Lab Results: No results found for this or any previous visit (from the past 48 hour(s)).  Blood Alcohol level:  Lab Results  Component Value Date   ETH <10 10/16/2018    Metabolic Disorder Labs: Lab Results  Component Value Date   HGBA1C 5.2 10/23/2018   MPG 102.54 10/23/2018   No  results found for: PROLACTIN Lab Results  Component Value Date   CHOL 129 10/23/2018   TRIG 58 10/23/2018   HDL 50 10/23/2018   CHOLHDL 2.6 10/23/2018   VLDL 12 10/23/2018   LDLCALC 67 10/23/2018    Physical Findings: AIMS: Facial and Oral Movements Muscles of Facial Expression: None, normal Lips and Perioral Area: None, normal Jaw: None, normal Tongue: None, normal,Extremity Movements Upper (arms, wrists, hands, fingers): None, normal Lower (legs, knees, ankles, toes): None, normal, Trunk Movements Neck, shoulders, hips: None, normal, Overall Severity Severity of abnormal movements (highest score from questions above): None, normal Incapacitation due to abnormal movements: None, normal Patient's awareness of abnormal movements (rate only patient's report): No Awareness, Dental Status Current problems with teeth and/or dentures?: No Does patient usually wear dentures?: No  CIWA:  CIWA-Ar Total: 0 COWS:  COWS Total Score: 0  Musculoskeletal: Strength & Muscle Tone: within normal limits Gait & Station: normal Patient leans: N/A  Psychiatric Specialty Exam: Physical Exam  Nursing note and vitals reviewed. Constitutional: He appears well-developed and well-nourished.  HENT:  Head: Normocephalic and atraumatic.  Eyes: Pupils are equal, round, and reactive to light. Conjunctivae are normal.  Neck: Normal range of motion.  Cardiovascular: Regular rhythm and normal heart sounds.  Respiratory: Effort normal.  GI: Soft.  Musculoskeletal: Normal range of motion.  Neurological: He is alert.  Skin: Skin is warm and dry.  Psychiatric: His behavior is normal. His affect is labile. His speech is delayed. He is not slowed. Thought content is paranoid and delusional. Cognition and memory are impaired. He expresses impulsivity.    Review of Systems  Constitutional: Negative.   HENT: Negative.   Eyes: Negative.   Respiratory: Negative.   Cardiovascular: Negative.    Gastrointestinal: Negative.   Musculoskeletal: Negative.   Skin: Negative.   Neurological: Negative.   Psychiatric/Behavioral: Negative for depression, hallucinations, memory loss, substance abuse and suicidal ideas. The patient is not nervous/anxious and does not have insomnia.     Blood pressure 109/73, pulse 69, temperature 97.6 F (36.4 C), temperature source Oral, resp. rate 17, height 5\' 11"  (1.803 m), weight 88.5 kg, SpO2 100 %.Body mass index is 27.2 kg/m.  General Appearance: Casual  Eye Contact:  Fair  Speech:  Clear and Coherent  Volume:  Normal  Mood:  Euthymic  Affect:  Constricted  Thought Process:  Goal Directed  Orientation:  Full (Time, Place, and Person)  Thought Content:  Illogical and Rumination  Suicidal Thoughts:  No  Homicidal Thoughts:  No  Memory:  Immediate;   Fair Recent;   Fair Remote;   Fair  Judgement:  Impaired  Insight:  Shallow  Psychomotor Activity:  Decreased  Concentration:  Concentration: Fair  Recall:  [employer] of Knowledge:  Fair  Language:  Fair  Akathisia:  No  Handed:  Right  AIMS (if indicated):     Assets:  Social Support  ADL's:  Impaired  Cognition:  Impaired,  Mild  Sleep:  Number of Hours: 6.5     Treatment Plan Summary: Daily contact with patient to assess and evaluate symptoms and progress in treatment, Medication management and Plan No change to antidepressant or mood stabilizer.  Continue encouragement to interact in group activities on the unit.  Working on possible transfer to long-term hospitalization.  Mordecai Rasmussen, MD 11/08/2018, 5:04 PM

## 2018-11-08 NOTE — Progress Notes (Signed)
Recreation Therapy Notes  Date: 11/08/2018  Time: 9:30 am   Location: Craft room   Behavioral response: N/A   Intervention Topic: Self-esteem  Discussion/Intervention: Patient did not attend group.   Clinical Observations/Feedback:  Patient did not attend group.   Gio Janoski LRT/CTRS        Philena Obey 11/08/2018 10:49 AM

## 2018-11-08 NOTE — Plan of Care (Signed)
Patient is calm and has shown some improvement in treatment regimen, medication is taken and patient expresses feeling fine in the unit, encourage coping skills, patient has been appropriate and demonstrating self control and mood is bright , safety is maintained, denies any SI/HI/AVH, Sleep is continuous and monitored every 15 minutes for safety, no distress.   Problem: Education: Goal: Knowledge of [facility] Education information/materials will improve Outcome: Progressing   Problem: Coping: Goal: Ability to verbalize frustrations and anger appropriately will improve Outcome: Progressing Goal: Ability to demonstrate self-control will improve Outcome: Progressing   Problem: Safety: Goal: Periods of time without injury will increase Outcome: Progressing   Problem: Education: Goal: Will be free of psychotic symptoms Outcome: Progressing Goal: Knowledge of the prescribed therapeutic regimen will improve Outcome: Progressing   Problem: Self-Concept: Goal: Will verbalize positive feelings about self Outcome: Progressing   Problem: Education: Goal: Ability to state activities that reduce stress will improve Outcome: Progressing

## 2018-11-08 NOTE — Progress Notes (Signed)
D: Patient continues to be delusional, giving various staff members "eternal life". Patient refused his Depakote because he says it is making him gain too much weight. Mood is pleasant. Affect is bright and animated. Denies SI, HI and AV hallucinations. A: Continue to monitor for safety and offer support. R: Safety maintained. 

## 2018-11-09 NOTE — [facility] Group Notes (Signed)
[facility] Group Notes:  (Nursing/MHT/Case Management/Adjunct)  Date:  11/09/2018  Time:  10:38 PM  Type of Therapy:  Group Therapy  Participation Level:  Did Not Attend  Jackson [name] 11/09/2018, 10:38 PM

## 2018-11-09 NOTE — Progress Notes (Signed)
Recreation Therapy Notes   Date: 11/09/2018  Time: 9:30 am   Location: Craft room   Behavioral response: N/A   Intervention Topic: Stress  Discussion/Intervention: Patient did not attend group.   Clinical Observations/Feedback:  Patient did not attend group.   Disaya Walt LRT/CTRS        Brynnley Dayrit 11/09/2018 11:15 AM

## 2018-11-09 NOTE — Progress Notes (Signed)
D- Patient alert and oriented.Uncooperative and disruptive at present. Denies SI, HI, AVH, and pain. Quotes: I understand other people need things but it gets on my nerves." Goal--achieved goal of taking medicines on schedule by contract.  A- Scheduled medications administered inconsistently depending mood at the time. Support and encouragement provided.  Routine safety checks conducted every 15 minutes.  Patient informed to notify staff with problems or concerns. R- No adverse drug reactions noted. Patient contracts for safety at this time.  Patient remains safe at this time.

## 2018-11-09 NOTE — Progress Notes (Signed)
[facility]  11/09/2018 9:47 AM Jackson [name]  MRN:  [id number]  33yo M, who [facility] due to exacerbation of Schizoaffective disorder, particulary mania and psychosis.  Subjective:    Patient seen, chart reviewed. Patient is calm, cooperative, participates in unit activities, interactive with staff and other patients, his behavior is appropriate.  Patient seen in his room. He asked why he is not in group, he answered "I am busy here". He was looking at painting on window glass. He explains: "this is hologram. Physics". His room appears trashed. He reports good mood and denies any current mental or physical complaints. He denies feeling depressed, anxious, suicidal, homicidal.   Principal Problem: Schizoaffective disorder, bipolar type (HCC) Diagnosis: Principal Problem:   Schizoaffective disorder, bipolar type (HCC) Active Problems:   Noncompliance  Total Time spent with patient: 15 minutes  Past Psychiatric History: see H&P  Past Medical History:  Past Medical History:  Diagnosis Date  . Bipolar 1 disorder (HCC)   . Schizoaffective disorder (HCC)    History reviewed. No pertinent surgical history. Family History: History reviewed. No pertinent family history. Family Psychiatric  History: see H&P Social History:  Social History   Substance and Sexual Activity  Alcohol Use Not on file     Social History   Substance and Sexual Activity  Drug Use Not on file    Social History   Socioeconomic History  . Marital status: Single    Spouse name: Not on file  . Number of children: Not on file  . Years of education: Not on file  . Highest education level: Not on file  Occupational History  . Not on file  Social Needs  . Financial resource strain: Not on file  . Food insecurity:    Worry: Not on file    Inability: Not on file  . Transportation needs:    Medical: Not on file    Non-medical: Not on file  Tobacco Use  . Smoking  status: Light Tobacco Smoker    Packs/day: 0.25    Types: Cigarettes  . Smokeless tobacco: Never Used  Substance and Sexual Activity  . Alcohol use: Not on file  . Drug use: Not on file  . Sexual activity: Not on file  Lifestyle  . Physical activity:    Days per week: Not on file    Minutes per session: Not on file  . Stress: Not on file  Relationships  . Social connections:    Talks on phone: Not on file    Gets together: Not on file    Attends religious service: Not on file    Active member of club or organization: Not on file    Attends meetings of clubs or organizations: Not on file    Relationship status: Not on file  Other Topics Concern  . Not on file  Social History Narrative  . Not on file   Additional Social History:                         Sleep: Fair  Appetite:  Fair  Current Medications: Current Facility-Administered Medications  Medication Dose Route Frequency Provider Last Rate Last Dose  . acetaminophen (TYLENOL) tablet 650 mg  650 mg Oral Q6H PRN Terance HartStephens, [name], MD   650 mg at 10/26/18 2302  . alum & mag hydroxide-simeth (MAALOX/MYLANTA) 200-200-20 MG/5ML suspension 30 mL  30 mL Oral Q4H PRN Terance HartStephens, [name], MD  30 mL at 10/26/18 2129  . ARIPiprazole (ABILIFY) tablet 15 mg  15 mg Oral Daily [name], [name], MD   15 mg at 11/09/18 5537  . divalproex (DEPAKOTE) DR tablet 1,000 mg  1,000 mg Oral Q12H [name], [name], MD   1,000 mg at 11/09/18 4827  . hydrOXYzine (ATARAX/VISTARIL) tablet 50 mg  50 mg Oral Q4H PRN [name], MD   50 mg at 10/28/18 0786  . magnesium hydroxide (MILK OF MAGNESIA) suspension 30 mL  30 mL Oral Daily PRN [name], MD      . OLANZapine [facility]) injection 10 mg  10 mg Intramuscular Q6H PRN [name], [name], MD      . OLANZapine (ZYPREXA) tablet 15 mg  15 mg Oral Q12H [name], [name], MD   15 mg at 11/09/18 7544  . traZODone (DESYREL) tablet 100 mg  100 mg Oral QHS PRN [name], MD    100 mg at 10/26/18 0034    Lab Results: No results found for this or any previous visit (from the past 48 hour(s)).  Blood Alcohol level:  Lab Results  Component Value Date   ETH <10 10/16/2018    Metabolic Disorder Labs: Lab Results  Component Value Date   HGBA1C 5.2 10/23/2018   MPG 102.54 10/23/2018   No results found for: PROLACTIN Lab Results  Component Value Date   CHOL 129 10/23/2018   TRIG 58 10/23/2018   HDL 50 10/23/2018   CHOLHDL 2.6 10/23/2018   VLDL 12 10/23/2018   LDLCALC 67 10/23/2018    Physical Findings: AIMS: Facial and Oral Movements Muscles of Facial Expression: None, normal Lips and Perioral Area: None, normal Jaw: None, normal Tongue: None, normal,Extremity Movements Upper (arms, wrists, hands, fingers): None, normal Lower (legs, knees, ankles, toes): None, normal, Trunk Movements Neck, shoulders, hips: None, normal, Overall Severity Severity of abnormal movements (highest score from questions above): None, normal Incapacitation due to abnormal movements: None, normal Patient's awareness of abnormal movements (rate only patient's report): No Awareness, Dental Status Current problems with teeth and/or dentures?: No Does patient usually wear dentures?: No  CIWA:  CIWA-Ar Total: 0 COWS:  COWS Total Score: 0  Musculoskeletal: Strength & Muscle Tone: within normal limits Gait & Station: normal Patient leans: N/A  Psychiatric Specialty Exam: Physical Exam   ROS   Blood pressure 110/73, pulse 71, temperature 98.3 F (36.8 C), temperature source Oral, resp. rate 18, height 5\' 11"  (1.803 m), weight 88.5 kg, SpO2 100 %.Body mass index is 27.2 kg/m.  General Appearance: appropriate  Eye Contact:  fair  Speech:  Normal Rate  Volume:  Normal  Mood:  Euthymic  Affect:  bizarre  Thought Process:  Coherent, but illogical  Orientation:  Full (Time, Place, and Person)  Thought Content:  delusions  Suicidal Thoughts:  No  Homicidal Thoughts:  No   Memory:  Immediate;   Fair Recent;   Fair Remote;   Fair  Judgement:  Other:  limited  Insight:  poor  Psychomotor Activity:  Normal  Concentration:  Concentration: Fair and Attention Span: Fair  Recall:  [employer] of Knowledge:  Fair  Language:  Fair  Akathisia:  No  Handed:  Right  AIMS (if indicated):     Assets:   ADL's:  Intact  Cognition:  Impaired,  Mild  Sleep:  Number of Hours: 4     Treatment Plan Summary: Daily contact with patient to assess and evaluate symptoms and progress in  treatment   33yo M, who presents to [facility] inpatient psych unit due to exacerbation of Schizoaffective disorder, particulary mania and psychosis. Patient still appears internally-stimulated, although he is calm, his behavior is appropriate. He is lacking insight in his mental condition and his judgment is poor. He is compliant with his medications. No medication changes today. No group home placement is available for the patient currently. Patient is on waiting list for [facility] as it likely will take long for his psychosis to improve.  Impression: Schizoaffective disorder, bipolar type, current episode - acute mania.  Plan: -continue inpatient psych admission; 15-minute checks; daily contact with patient to assess and evaluate symptoms and progress in treatment; psychoeducation.   -continue scheduled psych medications: Abilify  PO BID, Zyprexa  PO BID, Depakote  PO BID. This is not ideal to keep the patient on two antipsychotics, but it appears to be necessary for now to control patrient's psychosis.   -continue PRN medications.   -Disposition: Patient is on waiting list for [facility].   Thalia Party, MD 11/09/2018, 9:47 AM

## 2018-11-09 NOTE — [facility] Group Notes (Signed)
  LCSW Group Therapy Note  11/09/2018 11:44 AM   Type of Therapy/Topic:  Group Therapy:  Feelings about Diagnosis  Participation Level:  None   Description of Group:   This group will allow patients to explore their thoughts and feelings about diagnoses they have received. Patients will be guided to explore their level of understanding and acceptance of these diagnoses. Facilitator will encourage patients to process their thoughts and feelings about the reactions of others to their diagnosis and will guide patients in identifying ways to discuss their diagnosis with significant others in their lives. This group will be process-oriented, with patients participating in exploration of their own experiences, giving and receiving support, and processing challenge from other group members.   Therapeutic Goals: 1. Patient will demonstrate understanding of diagnosis as evidenced by identifying two or more symptoms of the disorder 2. Patient will be able to express two feelings regarding the diagnosis 3. Patient will demonstrate their ability to communicate their needs through discussion and/or role play  Summary of Patient Progress: Pt came in group late and stayed for about a minute and walked back out.   Therapeutic Modalities:   Cognitive Behavioral Therapy Brief Therapy Feelings Identification    Iris Pert, MSW, LCSW Clinical Social Work 11/09/2018 11:44 AM

## 2018-11-09 NOTE — Plan of Care (Signed)
Patient continues to have episodes of disruptive behavior, noncompliance with medication regimen, uncooperative and disruptive about other patients. Does not comply with contract to take medicines as scheduled. Attempts to get cooperation failed since patient won't even listen. Problem: Coping: Goal: Ability to demonstrate self-control will improve Outcome: Progressing   Problem: Safety: Goal: Periods of time without injury will increase Outcome: Progressing   Problem: Education: Goal: Will be free of psychotic symptoms Outcome: Progressing

## 2018-11-09 NOTE — Plan of Care (Signed)
Pt says that he slept well. Pt denies SI, HI, depression, anxiety, hallucinations. Pt staes he is "playing the game" referring to compliance taking meds. Pt could not give me a goal for today but complained of "sleeping to much." Pt was educated on careplan and understood. Torrie Mayers RN Problem: Education: Goal: Knowledge of [facility] Education information/materials will improve Outcome: Progressing   Problem: Coping: Goal: Ability to verbalize frustrations and anger appropriately will improve Outcome: Progressing Goal: Ability to demonstrate self-control will improve Outcome: Progressing   Problem: Safety: Goal: Periods of time without injury will increase Outcome: Progressing   Problem: Education: Goal: Will be free of psychotic symptoms Outcome: Progressing Goal: Knowledge of the prescribed therapeutic regimen will improve Outcome: Progressing   Problem: Self-Concept: Goal: Will verbalize positive feelings about self Outcome: Progressing   Problem: Education: Goal: Ability to state activities that reduce stress will improve Outcome: Progressing

## 2018-11-10 DIAGNOSIS — F250 Schizoaffective disorder, bipolar type: Secondary | ICD-10-CM

## 2018-11-10 NOTE — [facility] Group Notes (Signed)
Emotional Regulation 11/10/2018 1PM  Type of Therapy/Topic:  Group Therapy:  Emotion Regulation  Participation Level:  Did Not Attend   Description of Group:   The purpose of this group is to assist patients in learning to regulate negative emotions and experience positive emotions. Patients will be guided to discuss ways in which they have been vulnerable to their negative emotions. These vulnerabilities will be juxtaposed with experiences of positive emotions or situations, and patients will be challenged to use positive emotions to combat negative ones. Special emphasis will be placed on coping with negative emotions in conflict situations, and patients will process healthy conflict resolution skills.  Therapeutic Goals: 1. Patient will identify two positive emotions or experiences to reflect on in order to balance out negative emotions 2. Patient will label two or more emotions that they find the most difficult to experience 3. Patient will demonstrate positive conflict resolution skills through discussion and/or role plays  Summary of Patient Progress:       Therapeutic Modalities:   Cognitive Behavioral Therapy Feelings Identification Dialectical Behavioral Therapy   Suzan Slick, LCSW 11/10/2018 2:03 PM

## 2018-11-10 NOTE — Progress Notes (Signed)
[facility] MD Progress Note  11/10/2018 9:48 AM Jackson [name]  MRN:  [id number]  33yo M, who presents to [facility] inpatient psych unit due to exacerbation of Schizoaffective disorder, particulary mania and psychosis.  Subjective:    Patient seen, chart reviewed. Patient is calm, cooperative, participates in unit activities, interactive with staff and other patients, his behavior is appropriate.  Patient seen in his room. He continues not to go to groups and says "I am busy with my projects in my room". He reports good mood and denies any current mental or physical complaints. He denies feeling depressed, anxious, suicidal, homicidal. He was encouraged to participate in groups. We discussed that we have no placement for him yet. Patient says "I understand. It's ok".    Principal Problem: Schizoaffective disorder, bipolar type (HCC) Diagnosis: Principal Problem:   Schizoaffective disorder, bipolar type (HCC) Active Problems:   Noncompliance  Total Time spent with patient: 15 minutes  Past Psychiatric History: see H&P  Past Medical History:  Past Medical History:  Diagnosis Date  . Bipolar 1 disorder (HCC)   . Schizoaffective disorder (HCC)    History reviewed. No pertinent surgical history. Family History: History reviewed. No pertinent family history. Family Psychiatric  History: see H&P Social History:  Social History   Substance and Sexual Activity  Alcohol Use Not on file     Social History   Substance and Sexual Activity  Drug Use Not on file    Social History   Socioeconomic History  . Marital status: Single    Spouse name: Not on file  . Number of children: Not on file  . Years of education: Not on file  . Highest education level: Not on file  Occupational History  . Not on file  Social Needs  . Financial resource strain: Not on file  . Food insecurity:    Worry: Not on file    Inability: Not on file  . Transportation needs:    Medical: Not on file     Non-medical: Not on file  Tobacco Use  . Smoking status: Light Tobacco Smoker    Packs/day: 0.25    Types: Cigarettes  . Smokeless tobacco: Never Used  Substance and Sexual Activity  . Alcohol use: Not on file  . Drug use: Not on file  . Sexual activity: Not on file  Lifestyle  . Physical activity:    Days per week: Not on file    Minutes per session: Not on file  . Stress: Not on file  Relationships  . Social connections:    Talks on phone: Not on file    Gets together: Not on file    Attends religious service: Not on file    Active member of club or organization: Not on file    Attends meetings of clubs or organizations: Not on file    Relationship status: Not on file  Other Topics Concern  . Not on file  Social History Narrative  . Not on file   Additional Social History:                         Sleep: Fair  Appetite:  Fair  Current Medications: Current Facility-Administered Medications  Medication Dose Route Frequency Provider Last Rate Last Dose  . acetaminophen (TYLENOL) tablet 650 mg  650 mg Oral Q6H PRN [name], MD   650 mg at 10/26/18 2302  . alum & mag hydroxide-simeth (MAALOX/MYLANTA) 200-200-20 MG/5ML suspension 30 mL  30 mL Oral Q4H PRN [name], MD   30 mL at 10/26/18 2129  . ARIPiprazole (ABILIFY) tablet 15 mg  15 mg Oral Daily [name], [name], MD   15 mg at 11/10/18 0981  . divalproex (DEPAKOTE) DR tablet 1,000 mg  1,000 mg Oral Q12H [name], [name], MD   1,000 mg at 11/10/18 0821  . hydrOXYzine (ATARAX/VISTARIL) tablet 50 mg  50 mg Oral Q4H PRN [name], MD   50 mg at 10/28/18 1914  . magnesium hydroxide (MILK OF MAGNESIA) suspension 30 mL  30 mL Oral Daily PRN [name], MD      . OLANZapine [facility]) injection 10 mg  10 mg Intramuscular Q6H PRN [name], [name], MD      . OLANZapine (ZYPREXA) tablet 15 mg  15 mg Oral Q12H [name], [name], MD   15 mg at 11/10/18 7829  . traZODone (DESYREL) tablet 100  mg  100 mg Oral QHS PRN [name], MD   100 mg at 10/26/18 0034    Lab Results: No results found for this or any previous visit (from the past 48 hour(s)).  Blood Alcohol level:  Lab Results  Component Value Date   ETH <10 10/16/2018    Metabolic Disorder Labs: Lab Results  Component Value Date   HGBA1C 5.2 10/23/2018   MPG 102.54 10/23/2018   No results found for: PROLACTIN Lab Results  Component Value Date   CHOL 129 10/23/2018   TRIG 58 10/23/2018   HDL 50 10/23/2018   CHOLHDL 2.6 10/23/2018   VLDL 12 10/23/2018   LDLCALC 67 10/23/2018    Physical Findings: AIMS: Facial and Oral Movements Muscles of Facial Expression: None, normal Lips and Perioral Area: None, normal Jaw: None, normal Tongue: None, normal,Extremity Movements Upper (arms, wrists, hands, fingers): None, normal Lower (legs, knees, ankles, toes): None, normal, Trunk Movements Neck, shoulders, hips: None, normal, Overall Severity Severity of abnormal movements (highest score from questions above): None, normal Incapacitation due to abnormal movements: None, normal Patient's awareness of abnormal movements (rate only patient's report): No Awareness, Dental Status Current problems with teeth and/or dentures?: No Does patient usually wear dentures?: No  CIWA:  CIWA-Ar Total: 0 COWS:  COWS Total Score: 0  Musculoskeletal: Strength & Muscle Tone: within normal limits Gait & Station: normal Patient leans: N/A  Psychiatric Specialty Exam: Physical Exam   ROS   Blood pressure 107/74, pulse 75, temperature 98.6 F (37 C), temperature source Oral, resp. rate 18, height  (1.803 m), weight 88.5 kg, SpO2 100 %.Body mass index is 27.2 kg/m.  General Appearance: appropriate  Eye Contact:  fair  Speech:  Normal Rate  Volume:  Normal  Mood:  Euthymic  Affect:  bizarre  Thought Process:  Coherent, but illogical  Orientation:  Full (Time, Place, and Person)  Thought Content:  delusions   Suicidal Thoughts:  No  Homicidal Thoughts:  No  Memory:  Immediate;   Fair Recent;   Fair Remote;   Fair  Judgement:  Other:  limited  Insight:  poor  Psychomotor Activity:  Normal  Concentration:  Concentration: Fair and Attention Span: Fair  Recall:  [employer] of Knowledge:  Fair  Language:  Fair  Akathisia:  No  Handed:  Right  AIMS (if indicated):     Assets:   ADL's:  Intact  Cognition:  Impaired,  Mild  Sleep:  Number of Hours: 4.5     Treatment Plan Summary: Daily  contact with patient to assess and evaluate symptoms and progress in treatment   33yo M, who [facility] psych unit due to exacerbation of Schizoaffective disorder, particulary mania and psychosis. Patient continues to appear internally-stimulated, although he is calm, his behavior is appropriate. He is lacking insight in his mental condition and his judgment is poor. He is compliant with his medications. No medication changes today. No group home placement is available for the patient currently. [facility] as it likely will take long for his psychosis to improve.  Impression: Schizoaffective disorder, bipolar type, current episode - acute mania.  Plan: -continue inpatient psych admission; 15-minute checks; daily contact with patient to assess and evaluate symptoms and progress in treatment; psychoeducation.   -continue scheduled psych medications: Abilify 15mg  PO BID, Zyprexa 15mg  PO BID, Depakote 1000mg  PO BID. This is not ideal to keep the patient on two antipsychotics, but it appears to be necessary for now to control patrient's psychosis.   -continue PRN medications.   -Disposition: [facility]   Thalia PartyAlisa Idan Prime, MD 11/10/2018, 9:48 AM

## 2018-11-10 NOTE — Plan of Care (Signed)
Patient denies SI, HI and Auditory and Visual Hallucinations. Patient compliant this morning with medications although reported from last shift patient declined medications. Patient is very flat and sad affect. Patient can crave attention from nursing staff especially when anther patient is receiving any type of attention. Patient goes to groups every now and then. Problem: Education: Goal: Knowledge of [facility] Education information/materials will improve Outcome: Progressing   Problem: Coping: Goal: Ability to verbalize frustrations and anger appropriately will improve Outcome: Progressing Goal: Ability to demonstrate self-control will improve Outcome: Progressing   Problem: Education: Goal: Will be free of psychotic symptoms Outcome: Progressing Goal: Knowledge of the prescribed therapeutic regimen will improve Outcome: Progressing

## 2018-11-10 NOTE — Tx Team (Signed)
Interdisciplinary Treatment and Diagnostic Plan Update  11/10/2018 Time of Session: 8:30AM  Jackson [name] MRN: [id number]  Principal Diagnosis: Schizoaffective disorder, bipolar type [facility])  Secondary Diagnoses: Principal Problem:   Schizoaffective disorder, bipolar type (HCC) Active Problems:   Noncompliance   Current Medications:  Current Facility-Administered Medications  Medication Dose Route Frequency Provider Last Rate Last Dose  . acetaminophen (TYLENOL) tablet 650 mg  650 mg Oral Q6H PRN [name], MD   650 mg at 10/26/18 2302  . alum & mag hydroxide-simeth (MAALOX/MYLANTA) 200-200-20 MG/5ML suspension 30 mL  30 mL Oral Q4H PRN [name], MD   30 mL at 10/26/18 2129  . ARIPiprazole (ABILIFY) tablet 15 mg  15 mg Oral Daily [name], [name], MD   15 mg at 11/10/18 1791  . divalproex (DEPAKOTE) DR tablet 1,000 mg  1,000 mg Oral Q12H [name], [name], MD   1,000 mg at 11/10/18 0821  . hydrOXYzine (ATARAX/VISTARIL) tablet 50 mg  50 mg Oral Q4H PRN [name], MD   50 mg at 10/28/18 5056  . magnesium hydroxide (MILK OF MAGNESIA) suspension 30 mL  30 mL Oral Daily PRN [name], MD      . OLANZapine [facility]) injection 10 mg  10 mg Intramuscular Q6H PRN [name], [name], MD      . OLANZapine (ZYPREXA) tablet 15 mg  15 mg Oral Q12H [name], [name], MD   15 mg at 11/10/18 9794  . traZODone (DESYREL) tablet 100 mg  100 mg Oral QHS PRN [name], MD   100 mg at 10/26/18 0034   PTA Medications: No medications prior to admission.    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   Physician Treatment Plan for Primary Diagnosis: Schizoaffective disorder, bipolar type (HCC) Long  Term Goal(s): Improvement in symptoms so as ready for discharge Improvement in symptoms so as ready for discharge   Short Term Goals: Ability to demonstrate self-control will improve Ability to identify and develop effective coping behaviors will improve Compliance with prescribed medications will improve  Medication Management: Evaluate patient's response, side effects, and tolerance of medication regimen.  Therapeutic Interventions: 1 to 1 [place], Unit Group [place] and Medication administration.  Evaluation of Outcomes: Progressing  Physician Treatment Plan for Secondary Diagnosis: Principal Problem:   Schizoaffective disorder, bipolar type (HCC) Active Problems:   Noncompliance  Long Term Goal(s): Improvement in symptoms so as ready for discharge Improvement in symptoms so as ready for discharge   Short Term Goals: Ability to demonstrate self-control will improve Ability to identify and develop effective coping behaviors will improve Compliance with prescribed medications will improve     Medication Management: Evaluate patient's response, side effects, and tolerance of medication regimen.  Therapeutic Interventions: 1 to 1 [place], Unit Group Teal and Medication administration.  Evaluation of Outcomes: Progressing   RN Treatment Plan for Primary Diagnosis: Schizoaffective disorder, bipolar type (HCC) Long Term Goal(s): Knowledge of disease and therapeutic regimen to maintain health will improve  Short Term Goals: Ability to verbalize frustration and anger appropriately will improve, Ability to demonstrate self-control, Ability to participate in decision making will improve and Ability to verbalize feelings will improve  Medication Management: RN will administer medications as ordered by provider, will assess and evaluate patient's response and provide education to patient for prescribed  medication. RN will report any adverse and/or side effects to prescribing  provider.  Therapeutic Interventions: 1 on 1 counseling Med[place], Psychoeducation, Medication administration, Evaluate responses to treatment, Monitor vital signs and CBGs as ordered, Perform/monitor CIWA, COWS, AIMS and Fall Risk screenings as ordered, Perform wound care treatments as ordered.  Evaluation of Outcomes: Progressing   LCSW Treatment Plan for Primary Diagnosis: Schizoaffective disorder, bipolar type (HCC) Long Term Goal(s): Safe transition to appropriate next level of care at discharge, Engage patient in therapeutic group addressing interpersonal concerns.  Short Term Goals: Engage patient in aftercare planning with referrals and resources, Increase social support, Increase ability to appropriately verbalize feelings, Increase emotional regulation and Facilitate acceptance of mental health diagnosis and concerns  Therapeutic Interventions: Assess for all discharge needs, 1 to 1 time with Social worker, Explore available resources and support systems, Assess for adequacy in community support network, Educate family and significant other(s) on suicide prevention, Complete Psychosocial Assessment, Interpersonal group therapy.  Evaluation of Outcomes: Progressing   Progress in Treatment: Attending groups: Yes. Participating in groups: Yes. Taking medication as prescribed: Yes.   Toleration medication: Yes. Family/Significant other contact made: Yes, individual(s) contacted:  CSW completed SPE with the patients mother.  Patient understands diagnosis: Yes. Discussing patient identified problems/goals with staff: Yes. Medical problems stabilized or resolved: Yes. Denies suicidal/homicidal ideation: Yes. Issues/concerns per patient self-inventory: No. Other: none  New problem(s) identified: No, Describe:  none  New Short Term/Long Term Goal(s): elimination of symptoms of psychosis, medication management for mood stabilization; elimination of SI thoughts; development of  comprehensive mental wellness plan.  Patient Goals:  "independence"  Discharge Plan or Barriers: Pt has a legal guardian, his mother. Patient behaviors are inconsistent at this time.  Patient has moments of lucidity as well as moments of disorganized thinking.  Patient continues to move toward medication stabilization.  11/10/18-[facility] Pt showing signs of improvement-participating in activities and interacting appropriately with staff and other patients.  Reason for Continuation of Hospitalization: Aggression Delusions  Hallucinations Mania Medication stabilization  Estimated Length of Stay: TBD  Recreational Therapy: Patient Stressors: N/A Patient Goal: Patient will focus on task/topic with 2 prompts from staff within 5 recreation therapy group [place]  Attendees: Patient:  11/10/2018 9:52 AM  Physician: Thalia PartyAlisa Paliy 11/10/2018 9:52 AM  Nursing: Larina BrasShay Powell 11/10/2018 9:52 AM  RN Care Manager: 11/10/2018 9:52 AM  Social Worker: Lowella Dandyarren Konica Stankowski [place]ia Moton 11/10/2018 9:52 AM  Recreational Therapist:  11/10/2018 9:52 AM  Other:  11/10/2018 9:52 AM  Other:  11/10/2018 9:52 AM  Other: 11/10/2018 9:52 AM    Scribe for Treatment Team: Suzan SlickARREN T Raniah Karan, LCSW 11/10/2018 9:52 AM

## 2018-11-10 NOTE — Progress Notes (Signed)
Recreation Therapy Notes   Date: 11/10/2018  Time: 9:30 am   Location: Craft room   Behavioral response: N/A   Intervention Topic: Happiness  Discussion/Intervention: Patient did not attend group.   Clinical Observations/Feedback:  Patient did not attend group.   Strider Vallance LRT/CTRS         Jayke Caul 11/10/2018 10:49 AM

## 2018-11-11 NOTE — Progress Notes (Signed)
D: Pt during assessments denies SI/HI/AVH, contracts for safety. Pt is mostly pleasant and cooperative, engages fair. Pt. Endorses a mostly normal mood. Pt. Continues to present grandiose and mildly disorganized in his thought process.   A: Q x 15 minute observation checks to be completed for safety. Patient was provided with education, but continues to be non-accepting of this. Patient was given/offered medications per orders. Patient  was encourage to attend groups, participate in unit activities and continue with plan of care. Pt. Chart and plans of care reviewed. Pt. Given support and encouragement.   R: Patient is complaint with medications and unit procedures with direction and encouragement, but continues to be reluctant to take his medications. Pt. Continues to present with magical thinking and bizarre behaviors at times such as, "I'm de-activating my medications", rolling them in his hands and pressing on them before taking them. Pt. Eating good, attends all meals.

## 2018-11-11 NOTE — Plan of Care (Signed)
Patient mood is appropriate and affect is appropriate to circumstances, denies any SI/HI, appear impulsive and delusional, contract for safety, there is no self injurious behaviors, medication is taken adequately , patient is exhibiting intense distress, seeing patient on the phone yelling at somebody about his belongings at the group home. Patient contract for safety, and monitored every 15 minutes for safety.   Problem: Education: Goal: Knowledge of [facility] Education information/materials will improve 11/11/2018 0004 by Lelan Pons, RN Outcome: Progressing 11/11/2018 0004 by Lelan Pons, RN Outcome: Progressing   Problem: Coping: Goal: Ability to verbalize frustrations and anger appropriately will improve 11/11/2018 0004 by Lelan Pons, RN Outcome: Progressing 11/11/2018 0004 by Lelan Pons, RN Outcome: Progressing Goal: Ability to demonstrate self-control will improve 11/11/2018 0004 by Lelan Pons, RN Outcome: Progressing 11/11/2018 0004 by Lelan Pons, RN Outcome: Progressing   Problem: Safety: Goal: Periods of time without injury will increase 11/11/2018 0004 by Lelan Pons, RN Outcome: Progressing 11/11/2018 0004 by Lelan Pons, RN Outcome: Progressing   Problem: Education: Goal: Will be free of psychotic symptoms 11/11/2018 0004 by Lelan Pons, RN Outcome: Progressing 11/11/2018 0004 by Lelan Pons, RN Outcome: Progressing Goal: Knowledge of the prescribed therapeutic regimen will improve 11/11/2018 0004 by Lelan Pons, RN Outcome: Progressing 11/11/2018 0004 by Lelan Pons, RN Outcome: Progressing   Problem: Self-Concept: Goal: Will verbalize positive feelings about self 11/11/2018 0004 by Lelan Pons, RN Outcome: Progressing 11/11/2018 0004 by Lelan Pons, RN Outcome: Progressing   Problem: Education: Goal: Ability to state activities that reduce stress will improve 11/11/2018  0004 by Lelan Pons, RN Outcome: Progressing 11/11/2018 0004 by Lelan Pons, RN Outcome: Progressing

## 2018-11-11 NOTE — Progress Notes (Signed)
CSW spoke with [facility] who reported pt is still on the waiting list.  Iris Pert, MSW, LCSW Clinical Social Work 11/11/2018 3:18 PM

## 2018-11-11 NOTE — Progress Notes (Signed)
Recreation Therapy Notes  Date: 11/11/2018  Time: 9:30 am   Location: Craft room   Behavioral response: N/A   Intervention Topic: Creative Expressions  Discussion/Intervention: Patient did not attend group.   Clinical Observations/Feedback:  Patient did not attend group.   Lennell Shanks LRT/CTRS         Gerik Coberly 11/11/2018 11:09 AM

## 2018-11-11 NOTE — BH Assessment (Signed)
Received phone call from [facility] (Amor-[phone]), he remains on Wait List.

## 2018-11-11 NOTE — [facility] Group Notes (Signed)
.  LCSW Group Therapy Note   11/11/2018 1:39 PM   Type of Therapy and Topic:  Group Therapy:  Overcoming Obstacles   Participation Level:  None   Description of Group:    In this group patients will be encouraged to explore what they see as obstacles to their own wellness and recovery. They will be guided to discuss their thoughts, feelings, and behaviors related to these obstacles. The group will process together ways to cope with barriers, with attention given to specific choices patients can make. Each patient will be challenged to identify changes they are motivated to make in order to overcome their obstacles. This group will be process-oriented, with patients participating in exploration of their own experiences as well as giving and receiving support and challenge from other group members.   Therapeutic Goals: 1. Patient will identify personal and current obstacles as they relate to admission. 2. Patient will identify barriers that currently interfere with their wellness or overcoming obstacles.  3. Patient will identify feelings, thought process and behaviors related to these barriers. 4. Patient will identify two changes they are willing to make to overcome these obstacles:      Summary of Patient Progress Pt came to group for a few minutes and did not participate while in the group.     Therapeutic Modalities:   Cognitive Behavioral Therapy Solution Focused Therapy Motivational Interviewing Relapse Prevention Therapy  Iris Pert, MSW, LCSW Clinical Social Work 11/11/2018 1:39 PM

## 2018-11-11 NOTE — Progress Notes (Signed)
[facility] MD Progress Note  11/11/2018 4:54 PM Jackson [name]  MRN:  [id number] Subjective: Follow-up for this patient with schizoaffective disorder.  Patient is certainly improved over how he was when he came in but even in casual conversation is clear that he is still having psychotic symptoms.  During our conversation today he cast a spell on me and did some other Magick and handed me a piece of paper that he said would act as a magic button.  Patient is not causing any trouble on the unit.  Largely compliant with medication.  We have been in touch with his mother who is still opposed to the idea of discharging him while he is still having active symptoms. Principal Problem: Schizoaffective disorder, bipolar type (HCC) Diagnosis: Principal Problem:   Schizoaffective disorder, bipolar type (HCC) Active Problems:   Noncompliance  Total Time spent with patient: 30 minutes  Past Psychiatric History: Patient has a past history of schizoaffective disorder with lengthy hospitalizations.  Past Medical History:  Past Medical History:  Diagnosis Date  . Bipolar 1 disorder (HCC)   . Schizoaffective disorder (HCC)    History reviewed. No pertinent surgical history. Family History: History reviewed. No pertinent family history. Family Psychiatric  History: None known Social History:  Social History   Substance and Sexual Activity  Alcohol Use Not on file     Social History   Substance and Sexual Activity  Drug Use Not on file    Social History   Socioeconomic History  . Marital status: Single    Spouse name: Not on file  . Number of children: Not on file  . Years of education: Not on file  . Highest education level: Not on file  Occupational History  . Not on file  Social Needs  . Financial resource strain: Not on file  . Food insecurity:    Worry: Not on file    Inability: Not on file  . Transportation needs:    Medical: Not on file    Non-medical: Not on file  Tobacco Use  .  Smoking status: Light Tobacco Smoker    Packs/day: 0.25    Types: Cigarettes  . Smokeless tobacco: Never Used  Substance and Sexual Activity  . Alcohol use: Not on file  . Drug use: Not on file  . Sexual activity: Not on file  Lifestyle  . Physical activity:    Days per week: Not on file    Minutes per session: Not on file  . Stress: Not on file  Relationships  . Social connections:    Talks on phone: Not on file    Gets together: Not on file    Attends religious service: Not on file    Active member of club or organization: Not on file    Attends meetings of clubs or organizations: Not on file    Relationship status: Not on file  Other Topics Concern  . Not on file  Social History Narrative  . Not on file   Additional Social History:                         Sleep: Fair  Appetite:  Fair  Current Medications: Current Facility-Administered Medications  Medication Dose Route Frequency Provider Last Rate Last Dose  . acetaminophen (TYLENOL) tablet 650 mg  650 mg Oral Q6H PRN [name], MD   650 mg at 10/26/18 2302  . alum & mag hydroxide-simeth (MAALOX/MYLANTA) 200-200-20 MG/5ML suspension 30  mL  30 mL Oral Q4H PRN Terance HartStephens, [name], MD   30 mL at 10/26/18 2129  . ARIPiprazole (ABILIFY) tablet 15 mg  15 mg Oral Daily [name], Jackquline DenmarkJohn T, MD   15 mg at 11/11/18 0759  . divalproex (DEPAKOTE) DR tablet 1,000 mg  1,000 mg Oral Q12H [name], [name], MD   1,000 mg at 11/11/18 0759  . hydrOXYzine (ATARAX/VISTARIL) tablet 50 mg  50 mg Oral Q4H PRN Terance HartStephens, [name], MD   50 mg at 10/28/18 16100918  . magnesium hydroxide (MILK OF MAGNESIA) suspension 30 mL  30 mL Oral Daily PRN Terance HartStephens, [name], MD      . OLANZapine [facility](ZYPREXA) injection 10 mg  10 mg Intramuscular Q6H PRN [name], [name], MD      . OLANZapine (ZYPREXA) tablet 15 mg  15 mg Oral Q12H [name], Jackquline DenmarkJohn T, MD   15 mg at 11/11/18 0759  . traZODone (DESYREL) tablet 100 mg  100 mg Oral QHS PRN Terance HartStephens, Wayland  C, MD   100 mg at 10/26/18 0034    Lab Results: No results found for this or any previous visit (from the past 48 hour(s)).  Blood Alcohol level:  Lab Results  Component Value Date   ETH <10 10/16/2018    Metabolic Disorder Labs: Lab Results  Component Value Date   HGBA1C 5.2 10/23/2018   MPG 102.54 10/23/2018   No results found for: PROLACTIN Lab Results  Component Value Date   CHOL 129 10/23/2018   TRIG 58 10/23/2018   HDL 50 10/23/2018   CHOLHDL 2.6 10/23/2018   VLDL 12 10/23/2018   LDLCALC 67 10/23/2018    Physical Findings: AIMS: Facial and Oral Movements Muscles of Facial Expression: None, normal Lips and Perioral Area: None, normal Jaw: None, normal Tongue: None, normal,Extremity Movements Upper (arms, wrists, hands, fingers): None, normal Lower (legs, knees, ankles, toes): None, normal, Trunk Movements Neck, shoulders, hips: None, normal, Overall Severity Severity of abnormal movements (highest score from questions above): None, normal Incapacitation due to abnormal movements: None, normal Patient's awareness of abnormal movements (rate only patient's report): No Awareness, Dental Status Current problems with teeth and/or dentures?: No Does patient usually wear dentures?: No  CIWA:  CIWA-Ar Total: 0 COWS:  COWS Total Score: 0  Musculoskeletal: Strength & Muscle Tone: within normal limits Gait & Station: normal Patient leans: N/A  Psychiatric Specialty Exam: Physical Exam  Nursing note and vitals reviewed. Constitutional: He appears well-developed and well-nourished.  HENT:  Head: Normocephalic and atraumatic.  Eyes: Pupils are equal, round, and reactive to light. Conjunctivae are normal.  Neck: Normal range of motion.  Cardiovascular: Regular rhythm and normal heart sounds.  Respiratory: Effort normal.  GI: Soft.  Musculoskeletal: Normal range of motion.  Neurological: He is alert.  Skin: Skin is warm and dry.  Psychiatric: His affect is labile  and inappropriate. His speech is tangential. He is not agitated. Thought content is delusional. Cognition and memory are impaired. He expresses inappropriate judgment.    Review of Systems  Constitutional: Negative.   HENT: Negative.   Eyes: Negative.   Respiratory: Negative.   Cardiovascular: Negative.   Gastrointestinal: Negative.   Musculoskeletal: Negative.   Skin: Negative.   Neurological: Negative.   Psychiatric/Behavioral: Negative for depression, hallucinations, memory loss, substance abuse and suicidal ideas. The patient is not nervous/anxious and does not have insomnia.     Blood pressure 95/67, pulse 82, temperature 98.7 F (37.1 C), temperature source Oral, resp. rate 18, height 5'  11" (1.803 m), weight 88.5 kg, SpO2 99 %.Body mass index is 27.2 kg/m.  General Appearance: Casual  Eye Contact:  Fair  Speech:  Garbled  Volume:  Normal  Mood:  Euthymic  Affect:  Congruent  Thought Process:  Disorganized  Orientation:  Full (Time, Place, and Person)  Thought Content:  Illogical, Delusions and Rumination  Suicidal Thoughts:  No  Homicidal Thoughts:  No  Memory:  Immediate;   Fair Recent;   Fair Remote;   Fair  Judgement:  Impaired  Insight:  Shallow  Psychomotor Activity:  Normal  Concentration:  Concentration: Fair  Recall:  [employer] of Knowledge:  Fair  Language:  Fair  Akathisia:  No  Handed:  Right  AIMS (if indicated):     Assets:  Desire for Improvement Physical Health Social Support  ADL's:  Impaired  Cognition:  WNL  Sleep:  Number of Hours: 5.5     Treatment Plan Summary: Daily contact with patient to assess and evaluate symptoms and progress in treatment, Medication management and Plan Patient is improved but still psychotic.  Delusional bizarre in his thinking grandiose unlikely to be compliant with outpatient treatment.  We have referred him to [facility].  Continue current psychiatric medicine.  Reviewed treatment plan and  medication with the patient.  Mordecai Rasmussen, MD 11/11/2018, 4:54 PM

## 2018-11-11 NOTE — Plan of Care (Signed)
Pt. Is complaint with medications and unit procedures, but still reluctant to take medications. Pt. Self-control and behavior improved overall. Pt. Continues to present delusional and with magical thinking.    Problem: Education: Goal: Will be free of psychotic symptoms Outcome: Not Progressing   Problem: Coping: Goal: Ability to demonstrate self-control will improve Outcome: Progressing   Problem: Safety: Goal: Periods of time without injury will increase Outcome: Progressing

## 2018-11-12 MED ORDER — OLANZAPINE 10 MG PO TABS
20.0000 mg | ORAL_TABLET | Freq: Two times a day (BID) | ORAL | Status: DC
  Administered 2018-11-12 – 2018-11-29 (×34): 20 mg via ORAL
  Filled 2018-11-12 (×35): qty 2

## 2018-11-12 NOTE — B[facility] Counselor (Signed)
CSW spoke with pt's mother(CJ Enrenreich [phone]) on 11/11/18 to discuss discharge plan. CSW discussed  her thoughts on referring pt to another group home. She states she does not want a group home placement at this time stating she would like for the pt to be stable on his medication before a referral is made. She also states she is working closely with his physician(Dr. [name]) to make sure he has the correct medication. She says she is aware that the pt has been placed on the waiting list at [facility] and would like to continue pursuing a bed there.

## 2018-11-12 NOTE — Plan of Care (Signed)
  Problem: Education: Goal: Will be free of psychotic symptoms Outcome: Progressing  Patient appears free of psychosis.  

## 2018-11-12 NOTE — Progress Notes (Addendum)
Recreation Therapy Notes  Date: 11/12/2018  Time: 9:30 am  Location: Craft Room  Behavioral response: Disruptive  Intervention Topic: Time Management  Discussion/Intervention:  Group content today was focused on time management. The group defined time management and identified healthy ways to manage time. Individuals expressed how much of the 24 hours they use in a day. Patients expressed how much time they use just for themselves personally. The group expressed how they have managed their time in the past. Individuals participated in the intervention "Managing Life" where they had a chance to see how much of the 24 hours they use and where it goes. Clinical Observations/Feedback:  Patient came to group late and was loud and disruptive he needed redirection from group facilitator.  Ralph Benavidez LRT/CTRS         Pearly Bartosik 11/12/2018 11:52 AM

## 2018-11-12 NOTE — Progress Notes (Signed)
[facility] MD Progress Note  11/12/2018 3:12 PM Jackson [name]  MRN:  [id number] Subjective: Follow-up for this patient with schizoaffective bipolar type.  Patient seen chart reviewed.  Patient is making something of a mess in his room.  Writing and drawing obsessively.  In interview he continues to be grandiose and disorganized.  He has not been physically threatening or suicidal however.  Appears to be largely cooperative with medication.  Only complaint is about not being discharged.  We have not heard anything back at this point from [facility].  No new physical complaints other than he believes he has gained weight although I think he is exaggerating the amount Principal Problem: Schizoaffective disorder, bipolar type (HCC) Diagnosis: Principal Problem:   Schizoaffective disorder, bipolar type (HCC) Active Problems:   Noncompliance  Total Time spent with patient: 30 minutes  Past Psychiatric History: Patient has a history of schizoaffective or bipolar disorder and has had long hospitalizations in the past  Past Medical History:  Past Medical History:  Diagnosis Date  . Bipolar 1 disorder (HCC)   . Schizoaffective disorder (HCC)    History reviewed. No pertinent surgical history. Family History: History reviewed. No pertinent family history. Family Psychiatric  History: See previous Social History:  Social History   Substance and Sexual Activity  Alcohol Use Not on file     Social History   Substance and Sexual Activity  Drug Use Not on file    Social History   Socioeconomic History  . Marital status: Single    Spouse name: Not on file  . Number of children: Not on file  . Years of education: Not on file  . Highest education level: Not on file  Occupational History  . Not on file  Social Needs  . Financial resource strain: Not on file  . Food insecurity:    Worry: Not on file    Inability: Not on file  . Transportation needs:    Medical: Not on file   Non-medical: Not on file  Tobacco Use  . Smoking status: Light Tobacco Smoker    Packs/day: 0.25    Types: Cigarettes  . Smokeless tobacco: Never Used  Substance and Sexual Activity  . Alcohol use: Not on file  . Drug use: Not on file  . Sexual activity: Not on file  Lifestyle  . Physical activity:    Days per week: Not on file    Minutes per session: Not on file  . Stress: Not on file  Relationships  . Social connections:    Talks on phone: Not on file    Gets together: Not on file    Attends religious service: Not on file    Active member of club or organization: Not on file    Attends meetings of clubs or organizations: Not on file    Relationship status: Not on file  Other Topics Concern  . Not on file  Social History Narrative  . Not on file   Additional Social History:                         Sleep: Fair  Appetite:  Fair  Current Medications: Current Facility-Administered Medications  Medication Dose Route Frequency Provider Last Rate Last Dose  . acetaminophen (TYLENOL) tablet 650 mg  650 mg Oral Q6H PRN [name], MD   650 mg at 10/26/18 2302  . alum & mag hydroxide-simeth (MAALOX/MYLANTA) 200-200-20 MG/5ML suspension 30 mL  30 mL  Oral Q4H PRN Terance HartStephens, [name], MD   30 mL at 10/26/18 2129  . ARIPiprazole (ABILIFY) tablet 15 mg  15 mg Oral Daily Mikaylah Libbey, Jackquline DenmarkJohn T, MD   15 mg at 11/12/18 0818  . divalproex (DEPAKOTE) DR tablet 1,000 mg  1,000 mg Oral Q12H Terrilee Dudzik T, MD   1,000 mg at 11/12/18 0819  . hydrOXYzine (ATARAX/VISTARIL) tablet 50 mg  50 mg Oral Q4H PRN Terance HartStephens, [name], MD   50 mg at 10/28/18 16100918  . magnesium hydroxide (MILK OF MAGNESIA) suspension 30 mL  30 mL Oral Daily PRN Terance HartStephens, [name], MD      . OLANZapine [facility]ZYPREXA) injection 10 mg  10 mg Intramuscular Q6H PRN Javan Gonzaga T, MD      . OLANZapine (ZYPREXA) tablet 15 mg  15 mg Oral Q12H Azura Tufaro, Jackquline DenmarkJohn T, MD   15 mg at 11/12/18 0819  . traZODone (DESYREL) tablet 100  mg  100 mg Oral QHS PRN Terance HartStephens, [name], MD   100 mg at 10/26/18 0034    Lab Results: No results found for this or any previous visit (from the past 48 hour(s)).  Blood Alcohol level:  Lab Results  Component Value Date   ETH <10 10/16/2018    Metabolic Disorder Labs: Lab Results  Component Value Date   HGBA1C 5.2 10/23/2018   MPG 102.54 10/23/2018   No results found for: PROLACTIN Lab Results  Component Value Date   CHOL 129 10/23/2018   TRIG 58 10/23/2018   HDL 50 10/23/2018   CHOLHDL 2.6 10/23/2018   VLDL 12 10/23/2018   LDLCALC 67 10/23/2018    Physical Findings: AIMS: Facial and Oral Movements Muscles of Facial Expression: None, normal Lips and Perioral Area: None, normal Jaw: None, normal Tongue: None, normal,Extremity Movements Upper (arms, wrists, hands, fingers): None, normal Lower (legs, knees, ankles, toes): None, normal, Trunk Movements Neck, shoulders, hips: None, normal, Overall Severity Severity of abnormal movements (highest score from questions above): None, normal Incapacitation due to abnormal movements: None, normal Patient's awareness of abnormal movements (rate only patient's report): No Awareness, Dental Status Current problems with teeth and/or dentures?: No Does patient usually wear dentures?: No  CIWA:  CIWA-Ar Total: 0 COWS:  COWS Total Score: 0  Musculoskeletal: Strength & Muscle Tone: within normal limits Gait & Station: normal Patient leans: N/A  Psychiatric Specialty Exam: Physical Exam  Nursing note and vitals reviewed. Constitutional: He appears well-developed and well-nourished.  HENT:  Head: Normocephalic and atraumatic.  Eyes: Pupils are equal, round, and reactive to light. Conjunctivae are normal.  Neck: Normal range of motion.  Cardiovascular: Regular rhythm and normal heart sounds.  Respiratory: Effort normal. No respiratory distress.  GI: Soft.  Musculoskeletal: Normal range of motion.  Neurological: He is alert.   Skin: Skin is warm and dry.  Psychiatric: His affect is labile. His speech is tangential. He is agitated. He is not aggressive. Thought content is delusional. Cognition and memory are impaired. He expresses impulsivity. He expresses no homicidal and no suicidal ideation.    Review of Systems  Constitutional: Negative.   HENT: Negative.   Eyes: Negative.   Respiratory: Negative.   Cardiovascular: Negative.   Gastrointestinal: Negative.   Musculoskeletal: Negative.   Skin: Negative.   Neurological: Negative.   Psychiatric/Behavioral: Negative.     Blood pressure 110/69, pulse 77, temperature (!) 97.3 F (36.3 C), temperature source Oral, resp. rate 18, height 5\' 11"  (1.803 m), weight 88.5 kg, SpO2 99 %.Body mass index is  27.2 kg/m.  General Appearance: Casual  Eye Contact:  Good  Speech:  Pressured  Volume:  Increased  Mood:  Irritable  Affect:  Labile  Thought Process:  Disorganized  Orientation:  Full (Time, Place, and Person)  Thought Content:  Illogical, Delusions and Paranoid Ideation  Suicidal Thoughts:  No  Homicidal Thoughts:  No  Memory:  Immediate;   Fair Recent;   Fair Remote;   Fair  Judgement:  Impaired  Insight:  Shallow  Psychomotor Activity:  Normal  Concentration:  Concentration: Fair  Recall:  [employer] of Knowledge:  Fair  Language:  Fair  Akathisia:  No  Handed:  Right  AIMS (if indicated):     Assets:  Physical Health Resilience Social Support  ADL's:  Impaired  Cognition:  Impaired,  Mild  Sleep:  Number of Hours: 3     Treatment Plan Summary: Daily contact with patient to assess and evaluate symptoms and progress in treatment, Medication management and Plan Patient continues to show signs of mania.  Appears to be cooperative with his Depakote and Zyprexa both of which are at fairly high doses.  Has made it clear that he does not want to take lithium.  I will make an attempt to increase the Zyprexa dose to 20 mg twice a day to see if that  helps.  We have referred the patient to [facility].  I have petitioned the court for 60 extra days of commitment time for this.  We will see if the court agrees next week.  Supportive counseling and therapy and encouragement.  Mordecai Rasmussen, MD 11/12/2018, 3:12 PM

## 2018-11-12 NOTE — Plan of Care (Signed)
Patient is alert and oriented X 3. Patient has some magical thinking, casting spells to deactivate medications and stating, " I speak to the devil daily and  I tell the devil what to do." Patient does take his medications, does not attend any groups, eating meals well, and sleeping well. Patient denies SI and HI. Safety checks Q 15 minutes. Problem: Education: Goal: Knowledge of [facility] Education information/materials will improve Outcome: Progressing   Problem: Coping: Goal: Ability to verbalize frustrations and anger appropriately will improve Outcome: Progressing Goal: Ability to demonstrate self-control will improve Outcome: Progressing   Problem: Safety: Goal: Periods of time without injury will increase Outcome: Progressing   Problem: Education: Goal: Will be free of psychotic symptoms Outcome: Progressing Goal: Knowledge of the prescribed therapeutic regimen will improve Outcome: Progressing

## 2018-11-12 NOTE — Progress Notes (Signed)
Report received from outgoing nurse at 2300 ; Patient alert and oriented x 4, complaint with medication, no distress noted, 15 minutes safety checks maijntained will continue to monitor.

## 2018-11-13 NOTE — Plan of Care (Signed)
  Problem: Education: Goal: Will be free of psychotic symptoms Outcome: Progressing  Patient appears less psychotic  

## 2018-11-13 NOTE — Progress Notes (Signed)
Patient alert and oriented x 4, denies SI/HI/AVH no disruptive behavior during this shift, affect is blunted, he is interacting with staff and peers appropriately.  Patient's thoughts are organized and coherent no bizarre behavior and he is receptive to staff. Patient was complaint with medication. 15 minutes safety checks maintained will continue to monitor.

## 2018-11-13 NOTE — Progress Notes (Signed)
D: Pt during assessments is pleasant and cooperative, engaging mostly appropriate. Pt. Endorses a mostly normal mood, but seems ambivalent. Pt. Denies anxiety and/or depression. Denies pain.    A: Q x 15 minute observation checks to be completed for safety. Patient was provided with education.  Patient was given/offered medications per orders. Patient  was encourage to attend groups, participate in unit activities and continue with plan of care. Pt. Chart and plans of care reviewed. Pt. Given support and encouragement.   R: Patient is complaint with medications and unit procedures with direction and encouragement. Pt. Continues to be reluctant to take his medications, but does so compliantly. Pt. Self-control is improved, less impulsive overall. Pt. Continues to present with delusional thoughts of owning several businesses, being able to, "neutralize the medication", and having more money and wealth then realistically possible. Pt. Has been more isolative and withdrawn today to his room. Pt. Eating good though.

## 2018-11-13 NOTE — Plan of Care (Signed)
Patient still presenting with psychotic behaviors, having visual hallucinations and and being preoccupied religiously. Patient was compliant with medication administration.    Problem: Education: Goal: Will be free of psychotic symptoms Outcome: Not Progressing Goal: Knowledge of the prescribed therapeutic regimen will improve Outcome: Progressing

## 2018-11-13 NOTE — Progress Notes (Signed)
D - Patient was in his room upon arrival to the unit. Patient was pleasant during assessment and medication administration. Patient denies SI/HI/AVH, pain, anxiety and depression with this Clinical research associate. Patient presented with psychotic behavior stating to this writer, "do you see this halogram coming out of my hand? I have been working on this for days." Patient is religiously preoccupied stating he speaks with Jesus.   A - Patient was compliant with medication administration per MD orders and procedures on the unit. Patient given education. Patient given support and encouragement to be active in his treatment plan. Patient informed to let staff know if there are any issues or problems on the unit.   R - Patient being monitored Q 15 minutes for safety per unit protocol. Patient remains safe on the unit.

## 2018-11-13 NOTE — [facility] Group Notes (Signed)
LCSW Group Therapy Note   11/13/2018 1:15pm   Type of Therapy and Topic:  Group Therapy:  Trust and Honesty  Participation Level:  Did Not Attend  Description of Group:    In this group patients will be asked to explore the value of being honest.  Patients will be guided to discuss their thoughts, feelings, and behaviors related to honesty and trusting in others. Patients will process together how trust and honesty relate to forming relationships with peers, family members, and self. Each patient will be challenged to identify and express feelings of being vulnerable. Patients will discuss reasons why people are dishonest and identify alternative outcomes if one was truthful (to self or others). This group will be process-oriented, with patients participating in exploration of their own experiences, giving and receiving support, and processing challenge from other group members.   Therapeutic Goals: 1. Patient will identify why honesty is important to relationships and how honesty overall affects relationships.  2. Patient will identify a situation where they lied or were lied too and the  feelings, thought process, and behaviors surrounding the situation 3. Patient will identify the meaning of being vulnerable, how that feels, and how that correlates to being honest with self and others. 4. Patient will identify situations where they could have told the truth, but instead lied and explain reasons of dishonesty.   Summary of Patient Progress Pt was invited to attend group but chose not to attend. CSW will continue to encourage pt to attend group throughout their admission.     Therapeutic Modalities:   Cognitive Behavioral Therapy Solution Focused Therapy Motivational Interviewing Brief Therapy  Tita Terhaar  [name], LCSW 11/13/2018 12:35 PM  

## 2018-11-13 NOTE — Progress Notes (Signed)
[facility] MD Progress Note  11/13/2018 11:58 AM Jackson [name]  MRN:  [id number]    Subjective:    Nursing did not report any bizarre behavior for the patient yesterday but today he was "kicking a time trail" on the floor that appeared to be part of a visual hallucination and delusion.  The patient feels like he can control time.  Insight and judgment remain poor.  He denies any auditory or visual hallucinations despite delusional thoughts.  He denies any current active or passive suicidal thoughts.  Appetite is good.  He has not been attending.  He has been compliant with psychotropic medications and is aware of [facility]  He is upset with his mother for wanting him to go to long-term treatment.  He disagrees with the plan and feels like he does not have any mental health problems.  Vital signs have been stable.  He slept over 6 hours last night per nursing. He is easily redirectable and is not agitated.   Past Psychiatric History: Patient had a 2-year stay at brought in the hospital for his bipolar disorder.  Eventually his illness was brought under control with Zyprexa and lithium which were the medicines he was taking at discharge.  Unclear what medicines he was taking most recently the patient claims to not remember.  Every medicine that I mentioned he has complaints about.  He shows me evidence of having had gynecomastia in the past and blames it on medication.  He also says that when he was discharged from [facility] he weighed over 300 pounds.  He says because of this he refuses to take any medicine.  Denies past suicide attempts.    Principal Problem: Schizoaffective disorder, bipolar type (HCC) Diagnosis: Principal Problem:   Schizoaffective disorder, bipolar type (HCC) Active Problems:   Noncompliance  Total Time spent with patient: 20 minutes  Past Psychiatric History: Patient has a history of schizoaffective or bipolar disorder and has had  long hospitalizations in the past  Past Medical History:  Past Medical History:  Diagnosis Date  . Bipolar 1 disorder (HCC)   . Schizoaffective disorder (HCC)    History reviewed. No pertinent surgical history. Family History: History reviewed. No pertinent family history. Family Psychiatric  History: See previous Social History:  Social History   Substance and Sexual Activity  Alcohol Use Not on file     Social History   Substance and Sexual Activity  Drug Use Not on file    Social History   Socioeconomic History  . Marital status: Single    Spouse name: Not on file  . Number of children: Not on file  . Years of education: Not on file  . Highest education level: Not on file  Occupational History  . Not on file  Social Needs  . Financial resource strain: Not on file  . Food insecurity:    Worry: Not on file    Inability: Not on file  . Transportation needs:    Medical: Not on file    Non-medical: Not on file  Tobacco Use  . Smoking status: Light Tobacco Smoker    Packs/day: 0.25    Types: Cigarettes  . Smokeless tobacco: Never Used  Substance and Sexual Activity  . Alcohol use: Not on file  . Drug use: Not on file  . Sexual activity: Not on file  Lifestyle  . Physical activity:    Days per week: Not on file    Minutes  per session: Not on file  . Stress: Not on file  Relationships  . Social connections:    Talks on phone: Not on file    Gets together: Not on file    Attends religious service: Not on file    Active member of club or organization: Not on file    Attends meetings of clubs or organizations: Not on file    Relationship status: Not on file  Other Topics Concern  . Not on file  Social History Narrative  . Not on file       Sleep: 6.75 hours  Appetite:  Good  Current Medications: Current Facility-Administered Medications  Medication Dose Route Frequency Provider Last Rate Last Dose  . acetaminophen (TYLENOL) tablet 650 mg  650 mg  Oral Q6H PRN [name], MD   650 mg at 10/26/18 2302  . alum & mag hydroxide-simeth (MAALOX/MYLANTA) 200-200-20 MG/5ML suspension 30 mL  30 mL Oral Q4H PRN [name], MD   30 mL at 10/26/18 2129  . ARIPiprazole (ABILIFY) tablet 15 mg  15 mg Oral Daily [name], [name], MD   15 mg at 11/13/18 1610  . divalproex (DEPAKOTE) DR tablet 1,000 mg  1,000 mg Oral Q12H [name], [name], MD   1,000 mg at 11/13/18 0833  . hydrOXYzine (ATARAX/VISTARIL) tablet 50 mg  50 mg Oral Q4H PRN [name], MD   50 mg at 10/28/18 9604  . magnesium hydroxide (MILK OF MAGNESIA) suspension 30 mL  30 mL Oral Daily PRN [name], MD      . OLANZapine [facility]) injection 10 mg  10 mg Intramuscular Q6H PRN [name], [name], MD      . OLANZapine (ZYPREXA) tablet 20 mg  20 mg Oral Q12H [name], [name], MD   20 mg at 11/13/18 5409  . traZODone (DESYREL) tablet 100 mg  100 mg Oral QHS PRN [name], MD   100 mg at 11/12/18 2149    Lab Results: No results found for this or any previous visit (from the past 48 hour(s)).  Blood Alcohol level:  Lab Results  Component Value Date   ETH <10 10/16/2018    Metabolic Disorder Labs: Lab Results  Component Value Date   HGBA1C 5.2 10/23/2018   MPG 102.54 10/23/2018   No results found for: PROLACTIN Lab Results  Component Value Date   CHOL 129 10/23/2018   TRIG 58 10/23/2018   HDL 50 10/23/2018   CHOLHDL 2.6 10/23/2018   VLDL 12 10/23/2018   LDLCALC 67 10/23/2018    Physical Findings: AIMS: Facial and Oral Movements Muscles of Facial Expression: None, normal Lips and Perioral Area: None, normal Jaw: None, normal Tongue: None, normal,Extremity Movements Upper (arms, wrists, hands, fingers): None, normal Lower (legs, knees, ankles, toes): None, normal, Trunk Movements Neck, shoulders, hips: None, normal, Overall Severity Severity of abnormal movements (highest score from questions above): None, normal Incapacitation due to  abnormal movements: None, normal Patient's awareness of abnormal movements (rate only patient's report): No Awareness, Dental Status Current problems with teeth and/or dentures?: No Does patient usually wear dentures?: No  CIWA:  CIWA-Ar Total: 0 COWS:  COWS Total Score: 0  Musculoskeletal: Strength & Muscle Tone:  Within normal limits Gait & Station: normal Patient leans: N/A  Psychiatric Specialty Exam: Physical Exam  Nursing note and vitals reviewed. Respiratory: Effort normal. No respiratory distress.    Review of Systems  Constitutional: Negative.   HENT: Negative.   Eyes: Negative.  Respiratory: Negative.   Cardiovascular: Negative.   Gastrointestinal: Negative.   Musculoskeletal: Negative.   Skin: Negative.   Neurological: Negative.     Blood pressure 112/78, pulse 75, temperature 98.7 F (37.1 C), temperature source Oral, resp. rate 18, height 5\' 11"  (1.803 m), weight 88.5 kg, SpO2 100 %.Body mass index is 27.2 kg/m.  General Appearance: Casual  Eye Contact:  Fairly good  Speech:  Regular rate, sometimes mumbled  Volume: Normal  Mood:  "OK"  Affect:  Depressed  Thought Process: Disorganized  Orientation:  Full (Time, Place, and Person)  Thought Content:  Illogical, Delusions and Paranoid Ideation  Suicidal Thoughts:  Denies suicidal thoughts  Homicidal Thoughts:  Denies homicidal thoughts  Memory:  Immediate;   Fair Recent;   Fair Remote;   Fair  Judgement:  Poor  Insight:  Poor  Psychomotor Activity:  Normal  Concentration:  Concentration: Fair and Attention Span: Fair  Recall:  [employer]  Fund of Knowledge:  Fair  Language:  Good  Akathisia:  No  Handed:  Right  AIMS (if indicated):     Assets:  Physical Health Resilience Social Support  ADL's:  Impaired  Cognition:  Impaired,  Mild  Sleep:  Number of Hours: 6.75     Treatment Plan Summary:   Mr. Jackson [name] is a 33 year old African-American male with history of schizoaffective disorder bipolar  type who was admitted to inpatient psychiatry with psychotic symptoms.  He was initially noncompliant with medications but has been taking oral medications.  He continues to be psychotic  Schizoaffective disorder, bipolar type: We will plan to continue antipsychotics including Abilify 15 mg p.o. daily and Zyprexa 20 mg p.o. twice daily for psychosis. We will continue Depakote 1000 mg p.o. twice daily for mood stabilization.  Valproic acid level was 62 on April 1 EKG showed a QTc interval of 384. Hemoglobin A1c was 5.2.  TSH was within normal limits. Total cholesterol was 129  Dis[facility]: [facility].   Darliss RidgelAarti K , MD 11/13/2018, 11:58 AM

## 2018-11-13 NOTE — [facility] Group Notes (Signed)
[facility] Group Notes:  (Nursing/MHT/Case Management/Adjunct)  Date:  11/13/2018  Time:  9:20 PM  Type of Therapy:  Group Therapy  Participation Level:  Did Not Attend  [name] 11/13/2018, 9:20 PM

## 2018-11-13 NOTE — Plan of Care (Signed)
Pt. Denies si.hi.avh, can contract for safety. Pt. Self-control is improved, less impulsive overall. Pt. Continues to present with delusional thoughts of owning several businesses, being able to, "neutralize the medication", and having more money and wealth then realistically possible.    Problem: Education: Goal: Will be free of psychotic symptoms Outcome: Not Progressing   Problem: Coping: Goal: Ability to demonstrate self-control will improve Outcome: Progressing   Problem: Safety: Goal: Periods of time without injury will increase Outcome: Progressing

## 2018-11-14 NOTE — [facility] Group Notes (Signed)
LCSW Group Therapy Note 11/14/2018 1:15pm  Type of Therapy and Topic: Group Therapy: Feelings Around Returning Home & Establishing a Supportive Framework and Supporting Oneself When Supports Not Available  Participation Level: Did Not Attend  Description of Group:  Patients first processed thoughts and feelings about upcoming discharge. These included fears of upcoming changes, lack of change, new living environments, judgements and expectations from others and overall stigma of mental health issues. The group then discussed the definition of a supportive framework, what that looks and feels like, and how do to discern it from an unhealthy non-supportive network. The group identified different types of supports as well as what to do when your family/friends are less than helpful or unavailable  Therapeutic Goals  1. Patient will identify one healthy supportive network that they can use at discharge. 2. Patient will identify one factor of a supportive framework and how to tell it from an unhealthy network. 3. Patient able to identify one coping skill to use when they do not have positive supports from others. 4. Patient will demonstrate ability to communicate their needs through discussion and/or role plays.  Summary of Patient Progress:     Therapeutic Modalities Cognitive Behavioral Therapy Motivational Interviewing   Cristella Stiver  [name], LCSW 11/14/2018 11:12 AM

## 2018-11-14 NOTE — Progress Notes (Signed)
D: Pt during assessments denies SI/HI/AVH, able to contract for safety. Pt. Is mostly pleasant, but mood is more dysphoric during this morning's assessment.   A: Q x 15 minute observation checks to be completed for safety. Patient was provided with education.  Patient was given/offered medications per orders. Patient  was encourage to attend groups, participate in unit activities and continue with plan of care. Pt. Chart and plans of care reviewed. Pt. Given support and encouragement.   R: Patient is mostly complaint with medications and unit procedures, but requires redirection at times still. Pt. Continues to be reluctant to take his medications. Pt. Continues to present with magical thinking of being able to "deactivate the medicine", grandiose delusions of, "owning several businesses", and potentially visual hallucinations of "holograms". Pt. Behavior is more inappropriate this morning, visibly frustrated, irritable, and agitation, requiring redirection from staff. Pt. Eating good, attends all meals. Pt. Mostly isolative and withdrawn to room today.

## 2018-11-14 NOTE — Plan of Care (Signed)
Pt. Continues to present with magical thinking of being able to "deactivate the medicine", grandiose delusions of, "owning several businesses", and potentially visual hallucinations of "holograms". Pt. Behavior is more inappropriate this morning, visibly frustrated, irritable, and agitation, requiring redirection from staff. Pt. Monitored for safety on the unit, able to remain safe at this time.    Problem: Education: Goal: Will be free of psychotic symptoms Outcome: Not Progressing   Problem: Self-Concept: Goal: Will verbalize positive feelings about self Outcome: Not Progressing   Problem: Safety: Goal: Periods of time without injury will increase Outcome: Progressing

## 2018-11-14 NOTE — Progress Notes (Signed)
[facility] MD Progress Note  11/14/2018 10:32 AM Jackson [name]  MRN:  [id number]    Subjective:  The patient remains somewhat hyperreligious with paranoid thoughts and actively psychotic with visual hallucinations.  He was visually picturing holograms that he drew.  The patient was insistent on wanting to show this writer the hologram.  He denied any auditory hallucinations.  He denied feeling depressed but was frustrated with his entire hospitalization and being forced to take medications.  Insight and judgment remain poor.  He denies any current active or passive suicidal thoughts.  He denies any problems with insomnia and slept 5 hours last night.  He is sleeping on and off throughout the daytime and has been isolative to his room.  He denies any somatic complaints.  Vital signs have been stable.  Past Psychiatric History: Patient had a 2-year stay at brought in the hospital for his bipolar disorder.  Eventually his illness was brought under control with Zyprexa and lithium which were the medicines he was taking at discharge.  Unclear what medicines he was taking most recently the patient claims to not remember.  Every medicine that I mentioned he has complaints about.  He shows me evidence of having had gynecomastia in the past and blames it on medication.  He also says that when he was discharged from [facility] he weighed over 300 pounds.  He says because of this he refuses to take any medicine.  Denies past suicide attempts.    Principal Problem: Schizoaffective disorder, bipolar type (HCC) Diagnosis: Principal Problem:   Schizoaffective disorder, bipolar type (HCC) Active Problems:   Noncompliance  Total Time spent with patient: 20 minutes  Past Psychiatric History: Patient has a history of schizoaffective or bipolar disorder and has had long hospitalizations in the past  Past Medical History:  Past Medical History:  Diagnosis Date  . Bipolar 1 disorder (HCC)   . Schizoaffective disorder  (HCC)    History reviewed. No pertinent surgical history. Family History: History reviewed. No pertinent family history. Family Psychiatric  History: See previous Social History:  Social History   Substance and Sexual Activity  Alcohol Use Not on file     Social History   Substance and Sexual Activity  Drug Use Not on file    Social History   Socioeconomic History  . Marital status: Single    Spouse name: Not on file  . Number of children: Not on file  . Years of education: Not on file  . Highest education level: Not on file  Occupational History  . Not on file  Social Needs  . Financial resource strain: Not on file  . Food insecurity:    Worry: Not on file    Inability: Not on file  . Transportation needs:    Medical: Not on file    Non-medical: Not on file  Tobacco Use  . Smoking status: Light Tobacco Smoker    Packs/day: 0.25    Types: Cigarettes  . Smokeless tobacco: Never Used  Substance and Sexual Activity  . Alcohol use: Not on file  . Drug use: Not on file  . Sexual activity: Not on file  Lifestyle  . Physical activity:    Days per week: Not on file    Minutes per session: Not on file  . Stress: Not on file  Relationships  . Social connections:    Talks on phone: Not on file    Gets together: Not on file    Attends religious service: Not  on file    Active member of club or organization: Not on file    Attends meetings of clubs or organizations: Not on file    Relationship status: Not on file  Other Topics Concern  . Not on file  Social History Narrative  . Not on file       Sleep: 6.75 hours  Appetite:  Fairly good  Current Medications: Current Facility-Administered Medications  Medication Dose Route Frequency Provider Last Rate Last Dose  . acetaminophen (TYLENOL) tablet 650 mg  650 mg Oral Q6H PRN [name], MD   650 mg at 10/26/18 2302  . alum & mag hydroxide-simeth (MAALOX/MYLANTA) 200-200-20 MG/5ML suspension 30 mL  30 mL  Oral Q4H PRN [name], MD   30 mL at 10/26/18 2129  . ARIPiprazole (ABILIFY) tablet 15 mg  15 mg Oral Daily [name], [name], MD   15 mg at 11/14/18 0754  . divalproex (DEPAKOTE) DR tablet 1,000 mg  1,000 mg Oral Q12H [name], [name], MD   1,000 mg at 11/14/18 0753  . hydrOXYzine (ATARAX/VISTARIL) tablet 50 mg  50 mg Oral Q4H PRN [name], MD   50 mg at 10/28/18 7253  . magnesium hydroxide (MILK OF MAGNESIA) suspension 30 mL  30 mL Oral Daily PRN [name], MD      . OLANZapine [facility]) injection 10 mg  10 mg Intramuscular Q6H PRN [name], [name], MD      . OLANZapine (ZYPREXA) tablet 20 mg  20 mg Oral Q12H [name], [name], MD   20 mg at 11/14/18 0753  . traZODone (DESYREL) tablet 100 mg  100 mg Oral QHS PRN [name], MD   100 mg at 11/12/18 2149    Lab Results: No results found for this or any previous visit (from the past 48 hour(s)).  Blood Alcohol level:  Lab Results  Component Value Date   ETH <10 10/16/2018    Metabolic Disorder Labs: Lab Results  Component Value Date   HGBA1C 5.2 10/23/2018   MPG 102.54 10/23/2018   No results found for: PROLACTIN Lab Results  Component Value Date   CHOL 129 10/23/2018   TRIG 58 10/23/2018   HDL 50 10/23/2018   CHOLHDL 2.6 10/23/2018   VLDL 12 10/23/2018   LDLCALC 67 10/23/2018    Physical Findings: AIMS: Facial and Oral Movements Muscles of Facial Expression: None, normal Lips and Perioral Area: None, normal Jaw: None, normal Tongue: None, normal,Extremity Movements Upper (arms, wrists, hands, fingers): None, normal Lower (legs, knees, ankles, toes): None, normal, Trunk Movements Neck, shoulders, hips: None, normal, Overall Severity Severity of abnormal movements (highest score from questions above): None, normal Incapacitation due to abnormal movements: None, normal Patient's awareness of abnormal movements (rate only patient's report): No Awareness, Dental Status Current problems with  teeth and/or dentures?: No Does patient usually wear dentures?: No  CIWA:  CIWA-Ar Total: 0 COWS:  COWS Total Score: 0  Musculoskeletal: Strength & Muscle Tone:  Within normal limits Gait & Station: normal Patient leans: N/A  Psychiatric Specialty Exam: Physical Exam  Nursing note and vitals reviewed. Respiratory: Effort normal. No respiratory distress.    Review of Systems  Constitutional: Negative.   HENT: Negative.   Eyes: Negative.   Respiratory: Negative.   Cardiovascular: Negative.   Gastrointestinal: Negative.   Musculoskeletal: Negative.   Skin: Negative.   Neurological: Negative.     Blood pressure 108/69, pulse 78, temperature 98.4 F (36.9 C), temperature source  Oral, resp. rate 18, height 5\' 11"  (1.803 m), weight 88.5 kg, SpO2 100 %.Body mass index is 27.2 kg/m.  General Appearance: Casual  Eye Contact:  Fairly good  Speech:  Non-pressured; sometimes mumbled  Volume: Normal  Mood:  "not good" - upset about hospitalization  Affect:   Restricted  Thought Process: disorganized  Orientation:  Full (Time, Place, and Person)  Thought Content:  Paranoid and delusional thoughts; illogical  Suicidal Thoughts:  Denies suicidal thoughts  Homicidal Thoughts:  Denies homicidal thoughts  Memory:  Immediate;   Fair Recent;   Fair Remote;   Fair  Judgement: Poor  Insight: Poor  Psychomotor Activity:  Normal  Concentration:  Attention and concentration: fair  Recall:  [employer]  Fund of Knowledge:  Fair  Language:  Good  Akathisia:  No  Handed:  Right  AIMS (if indicated):     Assets:  Physical Health Resilience Social Support  ADL's:  Impaired  Cognition:  Impaired,  Mild  Sleep:  Number of Hours: 5     Treatment Plan Summary:   Jackson [name] is a 33 year old African-American male with history of schizoaffective disorder bipolar type who was admitted to inpatient psychiatry with psychotic symptoms.  He was initially noncompliant with medications but has been  taking oral medications.  He continues to be psychotic  Schizoaffective disorder, bipolar type: The patient will continue on Abilify 50 mg p.o. daily and Zyprexa 20 mg p.o. twice daily for psychosis We will continue Depakote 1000 mg p.o. twice daily for mood stabilization.  Valproic acid level was 62 on April 1 EKG showed a QTc interval 384 Hemoglobin A1c was 5.2.  TSH was within normal limits.  Total cholesterol was 129  Disposition: [facility]  He remains too psychotic to discharge      Darliss RidgelAarti K Jeovany Huitron, MD 11/14/2018, 10:32 AM

## 2018-11-15 NOTE — Progress Notes (Signed)
[facility] Progress Note  11/15/2018 4:37 PM Jackson [name]  MRN:  [id number] Subjective: Follow-up for this patient with schizoaffective disorder.  Chart reviewed.  Patient remains disorganized bizarre and psychotic.  Multiple instances of delusional statements over the weekend.  Continues to have only partial insight.  He appears to be taking his medicine but is still disorganized and not really showing an ability to take care of himself.  [facility] still [facility]. Principal Problem: Schizoaffective disorder, bipolar type (HCC) Diagnosis: Principal Problem:   Schizoaffective disorder, bipolar type (HCC) Active Problems:   Noncompliance  Total Time spent with patient: 15 minutes  Past Psychiatric History: History of bipolar disorder with history of lengthy hospitalizations in the past  Past Medical History:  Past Medical History:  Diagnosis Date  . Bipolar 1 disorder (HCC)   . Schizoaffective disorder (HCC)    History reviewed. No pertinent surgical history. Family History: History reviewed. No pertinent family history. Family Psychiatric  History: None reported Social History:  Social History   Substance and Sexual Activity  Alcohol Use Not on file     Social History   Substance and Sexual Activity  Drug Use Not on file    Social History   Socioeconomic History  . Marital status: Single    Spouse name: Not on file  . Number of children: Not on file  . Years of education: Not on file  . Highest education level: Not on file  Occupational History  . Not on file  Social Needs  . Financial resource strain: Not on file  . Food insecurity:    Worry: Not on file    Inability: Not on file  . Transportation needs:    Medical: Not on file    Non-medical: Not on file  Tobacco Use  . Smoking status: Light Tobacco Smoker    Packs/day: 0.25    Types: Cigarettes  . Smokeless tobacco: Never Used  Substance and Sexual Activity  . Alcohol use: Not on file   . Drug use: Not on file  . Sexual activity: Not on file  Lifestyle  . Physical activity:    Days per week: Not on file    Minutes per session: Not on file  . Stress: Not on file  Relationships  . Social connections:    Talks on phone: Not on file    Gets together: Not on file    Attends religious service: Not on file    Active member of club or organization: Not on file    Attends meetings of clubs or organizations: Not on file    Relationship status: Not on file  Other Topics Concern  . Not on file  Social History Narrative  . Not on file   Additional Social History:                         Sleep: Fair  Appetite:  Fair  Current Medications: Current Facility-Administered Medications  Medication Dose Route Frequency Provider Last Rate Last Dose  . acetaminophen (TYLENOL) tablet 650 mg  650 mg Oral Q6H PRN Terance HartStephens, [name], MD   650 mg at 10/26/18 2302  . alum & mag hydroxide-simeth (MAALOX/MYLANTA) 200-200-20 MG/5ML suspension 30 mL  30 mL Oral Q4H PRN Terance HartStephens, [name], MD   30 mL at 10/26/18 2129  . ARIPiprazole (ABILIFY) tablet 15 mg  15 mg Oral Daily [name], [name], MD   15 mg at 11/15/18 0800  .  divalproex (DEPAKOTE) DR tablet 1,000 mg  1,000 mg Oral Q12H [name], [name], MD   1,000 mg at 11/15/18 0801  . hydrOXYzine (ATARAX/VISTARIL) tablet 50 mg  50 mg Oral Q4H PRN [name], MD   50 mg at 10/28/18 1601  . magnesium hydroxide (MILK OF MAGNESIA) suspension 30 mL  30 mL Oral Daily PRN [name], MD      . OLANZapine [facility]) injection 10 mg  10 mg Intramuscular Q6H PRN [name], [name], MD      . OLANZapine (ZYPREXA) tablet 20 mg  20 mg Oral Q12H [name], [name], MD   20 mg at 11/15/18 0800  . traZODone (DESYREL) tablet 100 mg  100 mg Oral QHS PRN [name], MD   100 mg at 11/12/18 2149    Lab Results: No results found for this or any previous visit (from the past 48 hour(s)).  Blood Alcohol level:  Lab Results   Component Value Date   ETH <10 10/16/2018    Metabolic Disorder Labs: Lab Results  Component Value Date   HGBA1C 5.2 10/23/2018   MPG 102.54 10/23/2018   No results found for: PROLACTIN Lab Results  Component Value Date   CHOL 129 10/23/2018   TRIG 58 10/23/2018   HDL 50 10/23/2018   CHOLHDL 2.6 10/23/2018   VLDL 12 10/23/2018   LDLCALC 67 10/23/2018    Physical Findings: AIMS: Facial and Oral Movements Muscles of Facial Expression: None, normal Lips and Perioral Area: None, normal Jaw: None, normal Tongue: None, normal,Extremity Movements Upper (arms, wrists, hands, fingers): None, normal Lower (legs, knees, ankles, toes): None, normal, Trunk Movements Neck, shoulders, hips: None, normal, Overall Severity Severity of abnormal movements (highest score from questions above): None, normal Incapacitation due to abnormal movements: None, normal Patient's awareness of abnormal movements (rate only patient's report): No Awareness, Dental Status Current problems with teeth and/or dentures?: No Does patient usually wear dentures?: No  CIWA:  CIWA-Ar Total: 0 COWS:  COWS Total Score: 0  Musculoskeletal: Strength & Muscle Tone: within normal limits Gait & Station: normal Patient leans: N/A  Psychiatric Specialty Exam: Physical Exam  Nursing note and vitals reviewed. Constitutional: He appears well-developed and well-nourished.  HENT:  Head: Normocephalic and atraumatic.  Eyes: Pupils are equal, round, and reactive to light. Conjunctivae are normal.  Neck: Normal range of motion.  Cardiovascular: Regular rhythm and normal heart sounds.  Respiratory: Effort normal.  GI: Soft.  Musculoskeletal: Normal range of motion.  Neurological: He is alert.  Skin: Skin is warm and dry.  Psychiatric: His affect is labile. His speech is tangential. He is agitated. He is not aggressive. Thought content is delusional. Cognition and memory are impaired. He expresses inappropriate  judgment.    Review of Systems  Constitutional: Negative.   HENT: Negative.   Eyes: Negative.   Respiratory: Negative.   Cardiovascular: Negative.   Gastrointestinal: Negative.   Musculoskeletal: Negative.   Skin: Negative.   Neurological: Negative.   Psychiatric/Behavioral: Positive for hallucinations. The patient is nervous/anxious.     Blood pressure 100/67, pulse 70, temperature 98.7 F (37.1 C), temperature source Oral, resp. rate 18, height 5\' 11"  (1.803 m), weight 88.5 kg, SpO2 99 %.Body mass index is 27.2 kg/m.  General Appearance: Casual  Eye Contact:  Fair  Speech:  Normal Rate  Volume:  Increased  Mood:  Irritable  Affect:  Labile  Thought Process:  Disorganized  Orientation:  Full (Time, Place, and Person)  Thought Content:  Illogical, Delusions, Paranoid Ideation and Rumination  Suicidal Thoughts:  No  Homicidal Thoughts:  No  Memory:  Immediate;   Fair Recent;   Fair Remote;   Fair  Judgement:  Poor  Insight:  Shallow  Psychomotor Activity:  Decreased  Concentration:  Concentration: Fair  Recall:  [employer] of Knowledge:  Fair  Language:  Fair  Akathisia:  No  Handed:  Right  AIMS (if indicated):     Assets:  Physical Health Resilience Social Support  ADL's:  Intact  Cognition:  Impaired,  Mild  Sleep:  Number of Hours: 2.75     Treatment Plan Summary: Daily contact with patient to assess and evaluate symptoms and progress in treatment, Medication management and Plan Still sleeping poorly still disorganized still bizarre with delusions and grandiose weird behavior.  Compliant with medications but we will be increasing dosages especially on the Zyprexa.  Review labs.  [facility]  Continue to encourage patient to attend groups.  Mordecai Rasmussen, MD 11/15/2018, 4:37 PM

## 2018-11-15 NOTE — Plan of Care (Signed)
Patient aware of  information  given relating to [employer] education , patient  able to verbalize  understanding .  No anger outburst for past 24 hours . Able to maintained  self control . Voice of no safety concerns. Thought process improving. Compliant  with medication. Patient able to verbalize positive feeling of self .  Able to attend unit programing  that help reduce anxiety . Patient remains delusional , with magical thinking .   Problem: Education: Goal: Knowledge of [facility] [place] [facility] Education information/materials will improve Outcome: Progressing   Problem: Coping: Goal: Ability to verbalize frustrations and anger appropriately will improve Outcome: Progressing Goal: Ability to demonstrate self-control will improve Outcome: Progressing   Problem: Safety: Goal: Periods of time without injury will increase Outcome: Progressing   Problem: Education: Goal: Will be free of psychotic symptoms Outcome: Progressing Goal: Knowledge of the prescribed therapeutic regimen will improve Outcome: Progressing   Problem: Self-Concept: Goal: Will verbalize positive feelings about self Outcome: Progressing   Problem: Education: Goal: Ability to state activities that reduce stress will improve Outcome: Progressing   

## 2018-11-15 NOTE — Tx Team (Signed)
Interdisciplinary Treatment and Diagnostic Plan Update  11/15/2018 Time of Session: 8:30AM  Jackson [name] MRN: [id number]  Principal Diagnosis: Schizoaffective disorder, bipolar type [facility](HCC)  Secondary Diagnoses: Principal Problem:   Schizoaffective disorder, bipolar type (HCC) Active Problems:   Noncompliance   Current Medications:  Current Facility-Administered Medications  Medication Dose Route Frequency Provider Last Rate Last Dose  . acetaminophen (TYLENOL) tablet 650 mg  650 mg Oral Q6H PRN Terance HartStephens, [name], MD   650 mg at 10/26/18 2302  . alum & mag hydroxide-simeth (MAALOX/MYLANTA) 200-200-20 MG/5ML suspension 30 mL  30 mL Oral Q4H PRN Terance HartStephens, [name], MD   30 mL at 10/26/18 2129  . ARIPiprazole (ABILIFY) tablet 15 mg  15 mg Oral Daily [name], [name], MD   15 mg at 11/15/18 0800  . divalproex (DEPAKOTE) DR tablet 1,000 mg  1,000 mg Oral Q12H [name], Jackquline DenmarkJohn T, MD   1,000 mg at 11/15/18 0801  . hydrOXYzine (ATARAX/VISTARIL) tablet 50 mg  50 mg Oral Q4H PRN Terance HartStephens, [name], MD   50 mg at 10/28/18 [id number]  . magnesium hydroxide (MILK OF MAGNESIA) suspension 30 mL  30 mL Oral Daily PRN Terance HartStephens, [name], MD      . OLANZapine [facility](ZYPREXA) injection 10 mg  10 mg Intramuscular Q6H PRN [name], [name], MD      . OLANZapine (ZYPREXA) tablet 20 mg  20 mg Oral Q12H [name], [name], MD   20 mg at 11/15/18 0800  . traZODone (DESYREL) tablet 100 mg  100 mg Oral QHS PRN Terance HartStephens, [name], MD   100 mg at 11/12/18 2149   PTA Medications: No medications prior to admission.    Patient Stressors: Health problems Medication change or noncompliance  Patient Strengths: Manufacturing systems engineerCommunication skills Religious Affiliation Supportive family/friends  Treatment Modalities: Medication Management, Group therapy, Case management,  1 to 1 session with clinician, Psychoeducation, Recreational therapy.   Physician Treatment Plan for Primary Diagnosis: Schizoaffective disorder, bipolar type (HCC) Long  Term Goal(s): Improvement in symptoms so as ready for discharge Improvement in symptoms so as ready for discharge   Short Term Goals: Ability to demonstrate self-control will improve Ability to identify and develop effective coping behaviors will improve Compliance with prescribed medications will improve  Medication Management: Evaluate patient's response, side effects, and tolerance of medication regimen.  Therapeutic Interventions: 1 to 1 Bacci, Unit Group Strong and Medication administration.  Evaluation of Outcomes: Progressing  Physician Treatment Plan for Secondary Diagnosis: Principal Problem:   Schizoaffective disorder, bipolar type (HCC) Active Problems:   Noncompliance  Long Term Goal(s): Improvement in symptoms so as ready for discharge Improvement in symptoms so as ready for discharge   Short Term Goals: Ability to demonstrate self-control will improve Ability to identify and develop effective coping behaviors will improve Compliance with prescribed medications will improve     Medication Management: Evaluate patient's response, side effects, and tolerance of medication regimen.  Therapeutic Interventions: 1 to 1 Brownstein, Unit Group Garron and Medication administration.  Evaluation of Outcomes: Progressing   RN Treatment Plan for Primary Diagnosis: Schizoaffective disorder, bipolar type (HCC) Long Term Goal(s): Knowledge of disease and therapeutic regimen to maintain health will improve  Short Term Goals: Ability to verbalize frustration and anger appropriately will improve, Ability to demonstrate self-control, Ability to participate in decision making will improve and Ability to verbalize feelings will improve  Medication Management: RN will administer medications as ordered by provider, will assess and evaluate patient's response and provide education to patient for prescribed  medication. RN will report any adverse and/or side effects to prescribing  provider.  Therapeutic Interventions: 1 on 1 counseling [place], Psychoeducation, Medication administration, Evaluate responses to treatment, Monitor vital signs and CBGs as ordered, Perform/monitor CIWA, COWS, AIMS and Fall Risk screenings as ordered, Perform wound care treatments as ordered.  Evaluation of Outcomes: Progressing   LCSW Treatment Plan for Primary Diagnosis: Schizoaffective disorder, bipolar type (HCC) Long Term Goal(s): Safe transition to appropriate next level of care at discharge, Engage patient in therapeutic group addressing interpersonal concerns.  Short Term Goals: Engage patient in aftercare planning with referrals and resources, Increase social support, Increase ability to appropriately verbalize feelings, Increase emotional regulation and Facilitate acceptance of mental health diagnosis and concerns  Therapeutic Interventions: Assess for all discharge needs, 1 to 1 time with Social worker, Explore available resources and support systems, Assess for adequacy in community support network, Educate family and significant other(s) on suicide prevention, Complete Psychosocial Assessment, Interpersonal group therapy.  Evaluation of Outcomes: Progressing   Progress in Treatment: Attending groups: Yes. Participating in groups: Yes. Taking medication as prescribed: Yes.   Toleration medication: Yes. Family/Significant other contact made: Yes, individual(s) contacted:  CSW completed SPE with the patients mother.  Patient understands diagnosis: Yes. Discussing patient identified problems/goals with staff: Yes. Medical problems stabilized or resolved: Yes. Denies suicidal/homicidal ideation: Yes. Issues/concerns per patient self-inventory: No. Other: none  New problem(s) identified: No, Describe:  none  New Short Term/Long Term Goal(s): elimination of symptoms of psychosis, medication management for mood stabilization; elimination of SI thoughts; development of  comprehensive mental wellness plan.  Patient Goals:  "independence"  Discharge Plan or Barriers: Pt has a legal guardian, his mother. Patient behaviors are inconsistent at this time.  Patient has moments of lucidity as well as moments of disorganized thinking.  Patient continues to move toward medication stabilization.  11/10/18-Pt is on the [facility]list at [facility]. Pt showing signs of improvement-participating in activities and interacting appropriately with staff and other patients.  Reason for Continuation of Hospitalization: Aggression Delusions  Hallucinations Mania Medication stabilization  Estimated Length of Stay: TBD  Recreational Therapy: Patient Stressors: N/A Patient Goal: Patient will focus on task/topic with 2 prompts from staff within 5 recreation therapy group [place]  Attendees: Patient:  11/15/2018 3:05 PM  Physician: Dr. Toni Amend, MD 11/15/2018 3:05 PM  Nursing:  11/15/2018 3:05 PM  RN Care Manager: 11/15/2018 3:05 PM  Social Worker: Penni Homans, LCSW 11/15/2018 3:05 PM  Recreational Therapist:  11/15/2018 3:05 PM  Other:  11/15/2018 3:05 PM  Other:  11/15/2018 3:05 PM  Other: 11/15/2018 3:05 PM    Scribe for Treatment Team: Harden Mo, LCSW 11/15/2018 3:05 PM

## 2018-11-15 NOTE — [facility] Group Notes (Signed)
LCSW Group Therapy Note   11/15/2018 1:00 PM  Type of Therapy and Topic:  Group Therapy:  Overcoming Obstacles   Participation Level:  Did Not Attend   Description of Group:    In this group patients will be encouraged to explore what they see as obstacles to their own wellness and recovery. They will be guided to discuss their thoughts, feelings, and behaviors related to these obstacles. The group will process together ways to cope with barriers, with attention given to specific choices patients can make. Each patient will be challenged to identify changes they are motivated to make in order to overcome their obstacles. This group will be process-oriented, with patients participating in exploration of their own experiences as well as giving and receiving support and challenge from other group members.   Therapeutic Goals: 1. Patient will identify personal and current obstacles as they relate to admission. 2. Patient will identify barriers that currently interfere with their wellness or overcoming obstacles.  3. Patient will identify feelings, thought process and behaviors related to these barriers. 4. Patient will identify two changes they are willing to make to overcome these obstacles:      Summary of Patient Progress X   Therapeutic Modalities:   Cognitive Behavioral Therapy Solution Focused Therapy Motivational Interviewing Relapse Prevention Therapy  Kele Withem, MSW, LCSW 11/15/2018 2:51 PM    

## 2018-11-15 NOTE — Progress Notes (Signed)
D: Patient stated slept good last night .Stated appetite is good and energy level  Is normal. Stated concentration is good . Stated on Depression scale 0, hopeless 0 and anxiety 0 .( low 0-10 high) Denies suicidal  homicidal ideations .  No auditory hallucinations  No pain concerns . Appropriate ADL'S. Interacting with peers and staff. Patient aware of  information  given relating to [facility] [facility] education , patient  able to verbalize  understanding .  No anger outburst for past 24 hours . Able to maintained  self control . Voice of no safety concerns. Thought process improving. Compliant  with medication. Patient able to verbalize positive feeling of self .  Able to attend unit programing  that help reduce anxiety . Patient remains delusional , with magical thinking . N;oted to press on medication each morning . States he is desensitizing the effects of the medications . Patient constantly reinvent theory  in room Stated his goal  Today was to work on biblical and scientific studies   A: Encourage patient participation with unit programming . Instruction  Given on  Medication , verbalize understanding. Patient has drawing of the north star on window in crayon.  R: Voice no other concerns. Staff continue to monitor

## 2018-11-15 NOTE — [facility] Counselor (Signed)
CSW received a call from [facility] reporting that the patient is still on the wait list.   Penni Homans, MSW, LCSW 11/15/2018 9:45 AM

## 2018-11-15 NOTE — Progress Notes (Signed)
Recreation Therapy Notes  Date: 11/15/2018  Time: 9:30 am   Location: Craft room   Behavioral response: N/A   Intervention Topic: Strengths  Discussion/Intervention: Patient did not attend group.   Clinical Observations/Feedback:  Patient did not attend group.   Pairlee Sawtell LRT/CTRS        Baldomero Mirarchi 11/15/2018 10:58 AM 

## 2018-11-15 NOTE — Plan of Care (Signed)
D: Patient has appeared more subdued today. Isolative to room more. Patient was medication compliant. Not attempting to show any "holograms" or trying to show me any more lightsabers that he has been building, using the red light from the medication scanner. Less animated. Denies SI, HI and AV hallucinations. Still maintains he is not psychotic. A: Continue to monitor for safety and offer support. R: Safety maintained.

## 2018-11-15 NOTE — Progress Notes (Signed)
D: Patient has appeared more subdued today. Isolative to room more. Patient was medication compliant. Not attempting to show any "holograms" or trying to show me any more lightsabers that he has been building, using the red light from the medication scanner. Less animated. Denies SI, HI and AV hallucinations. Still maintains he is not psychotic. A: Continue to monitor for safety and offer support. R: Safety maintained. 

## 2018-11-16 LAB — VALPROIC ACID LEVEL: Valproic Acid Lvl: 117 ug/mL — ABNORMAL HIGH (ref 50.0–100.0)

## 2018-11-16 NOTE — Progress Notes (Signed)
[facility] MD Progress Note  11/16/2018 1:37 PM Jackson [name]  MRN:  [id number] Subjective: Patient seen and chart reviewed.  Patient with schizoaffective disorder bipolar type.  Patient has not been agitated or causing any disruption on the unit.  Attends most groups and is reasonably appropriate although still showing signs of his grandiose psychosis.  On interview today the patient shows me a "hologram" that he claims to have designed using a bottlecap.  He is grandiose still talking about his [employer] and all that but not agitated.  Still however unable to make reasonable decisions or plans about himself or his future and still hostile towards his mother.  Appears to be tolerating medicine blood levels have been adequate. Principal Problem: Schizoaffective disorder, bipolar type (HCC) Diagnosis: Principal Problem:   Schizoaffective disorder, bipolar type (HCC) Active Problems:   Noncompliance  Total Time spent with patient: 30 minutes  Past Psychiatric History: Patient has a long history of bipolar disorder with extended hospitalizations  Past Medical History:  Past Medical History:  Diagnosis Date  . Bipolar 1 disorder (HCC)   . Schizoaffective disorder (HCC)    History reviewed. No pertinent surgical history. Family History: History reviewed. No pertinent family history. Family Psychiatric  History: See previous Social History:  Social History   Substance and Sexual Activity  Alcohol Use Not on file     Social History   Substance and Sexual Activity  Drug Use Not on file    Social History   Socioeconomic History  . Marital status: Single    Spouse name: Not on file  . Number of children: Not on file  . Years of education: Not on file  . Highest education level: Not on file  Occupational History  . Not on file  Social Needs  . Financial resource strain: Not on file  . Food insecurity:    Worry: Not on file    Inability: Not on file  . Transportation  needs:    Medical: Not on file    Non-medical: Not on file  Tobacco Use  . Smoking status: Light Tobacco Smoker    Packs/day: 0.25    Types: Cigarettes  . Smokeless tobacco: Never Used  Substance and Sexual Activity  . Alcohol use: Not on file  . Drug use: Not on file  . Sexual activity: Not on file  Lifestyle  . Physical activity:    Days per week: Not on file    Minutes per session: Not on file  . Stress: Not on file  Relationships  . Social connections:    Talks on phone: Not on file    Gets together: Not on file    Attends religious service: Not on file    Active member of club or organization: Not on file    Attends meetings of clubs or organizations: Not on file    Relationship status: Not on file  Other Topics Concern  . Not on file  Social History Narrative  . Not on file   Additional Social History:                         Sleep: Fair  Appetite:  Fair  Current Medications: Current Facility-Administered Medications  Medication Dose Route Frequency Provider Last Rate Last Dose  . acetaminophen (TYLENOL) tablet 650 mg  650 mg Oral Q6H PRN [name], MD   650 mg at 10/26/18 2302  . alum & mag hydroxide-simeth (MAALOX/MYLANTA) 200-200-20 MG/5ML  suspension 30 mL  30 mL Oral Q4H PRN Terance HartStephens, [name], MD   30 mL at 10/26/18 2129  . ARIPiprazole (ABILIFY) tablet 15 mg  15 mg Oral Daily Nedra Mcinnis, Jackquline DenmarkJohn T, MD   15 mg at 11/16/18 0835  . divalproex (DEPAKOTE) DR tablet 1,000 mg  1,000 mg Oral Q12H Jnyah Brazee T, MD   1,000 mg at 11/16/18 0835  . hydrOXYzine (ATARAX/VISTARIL) tablet 50 mg  50 mg Oral Q4H PRN Terance HartStephens, [name], MD   50 mg at 10/28/18 [id number]  . magnesium hydroxide (MILK OF MAGNESIA) suspension 30 mL  30 mL Oral Daily PRN Terance HartStephens, [name], MD      . OLANZapine [facility](ZYPREXA) injection 10 mg  10 mg Intramuscular Q6H PRN Simrin Vegh T, MD      . OLANZapine (ZYPREXA) tablet 20 mg  20 mg Oral Q12H Alylah Blakney, Jackquline DenmarkJohn T, MD   20 mg at 11/16/18 0835   . traZODone (DESYREL) tablet 100 mg  100 mg Oral QHS PRN Terance HartStephens, [name], MD   100 mg at 11/12/18 2149    Lab Results:  Results for orders placed or performed during the hospital encounter of 10/17/18 (from the past 48 hour(s))  Valproic acid level     Status: Abnormal   Collection Time: 11/16/18  6:41 AM  Result Value Ref Range   Valproic Acid Lvl 117 (H) 50.0 - 100.0 ug/mL    Comment: Performed at [facility], [address] [place], [place] [id number]    Blood Alcohol level:  Lab Results  Component Value Date   [facility] <10 10/16/2018    Metabolic Disorder Labs: Lab Results  Component Value Date   HGBA1C 5.2 10/23/2018   MPG 102.54 10/23/2018   No results found for: PROLACTIN Lab Results  Component Value Date   CHOL 129 10/23/2018   TRIG 58 10/23/2018   HDL 50 10/23/2018   CHOLHDL 2.6 10/23/2018   VLDL 12 10/23/2018   LDLCALC 67 10/23/2018    Physical Findings: AIMS: Facial and Oral Movements Muscles of Facial Expression: None, normal Lips and Perioral Area: None, normal Jaw: None, normal Tongue: None, normal,Extremity Movements Upper (arms, wrists, hands, fingers): None, normal Lower (legs, knees, ankles, toes): None, normal, Trunk Movements Neck, shoulders, hips: None, normal, Overall Severity Severity of abnormal movements (highest score from questions above): None, normal Incapacitation due to abnormal movements: None, normal Patient's awareness of abnormal movements (rate only patient's report): No Awareness, Dental Status Current problems with teeth and/or dentures?: No Does patient usually wear dentures?: No  CIWA:  CIWA-Ar Total: 0 COWS:  COWS Total Score: 0  Musculoskeletal: Strength & Muscle Tone: within normal limits Gait & Station: normal Patient leans: N/A  Psychiatric Specialty Exam: Physical Exam  Nursing note and vitals reviewed. Constitutional: He appears well-developed and well-nourished.  HENT:  Head: Normocephalic and  atraumatic.  Eyes: Pupils are equal, round, and reactive to light. Conjunctivae are normal.  Neck: Normal range of motion.  Cardiovascular: Regular rhythm and normal heart sounds.  Respiratory: Effort normal. No respiratory distress.  GI: Soft.  Musculoskeletal: Normal range of motion.  Neurological: He is alert.  Skin: Skin is warm and dry.  Psychiatric: His affect is labile. His speech is tangential. He is not agitated and not aggressive. Thought content is delusional. Cognition and memory are impaired. He expresses impulsivity.    Review of Systems  Constitutional: Negative.   HENT: Negative.   Eyes: Negative.   Respiratory: Negative.   Cardiovascular: Negative.  Gastrointestinal: Negative.   Musculoskeletal: Negative.   Skin: Negative.   Neurological: Negative.   Psychiatric/Behavioral: Positive for hallucinations. Negative for depression, memory loss, substance abuse and suicidal ideas. The patient is not nervous/anxious and does not have insomnia.     Blood pressure 101/66, pulse 71, temperature 97.8 F (36.6 C), temperature source Oral, resp. rate 16, height  (1.803 m), weight 88.5 kg, SpO2 100 %.Body mass index is 27.2 kg/m.  General Appearance: Casual  Eye Contact:  Good  Speech:  Pressured  Volume:  Increased  Mood:  Euphoric  Affect:  Congruent  Thought Process:  Coherent and Disorganized  Orientation:  Full (Time, Place, and Person)  Thought Content:  Illogical, Delusions, Rumination and Tangential  Suicidal Thoughts:  No  Homicidal Thoughts:  No  Memory:  Immediate;   Fair Recent;   Poor Remote;   Fair  Judgement:  Impaired  Insight:  Shallow  Psychomotor Activity:  Normal  Concentration:  Concentration: Fair  Recall:  [employer] of Knowledge:  Fair  Language:  Fair  Akathisia:  No  Handed:  Right  AIMS (if indicated):     Assets:  Communication Skills Desire for Improvement Financial Resources/Insurance Physical Health Resilience Social  Support  ADL's:  Intact  Cognition:  Impaired,  Mild  Sleep:  Number of Hours: 6.3     Treatment Plan Summary: Daily contact with patient to assess and evaluate symptoms and progress in treatment, Medication management and Plan Patient is sleeping better.  Not suicidal.  Not expressing hostility.  Still pretty clearly and freely expresses his grandiosity and psychosis.  Mother, who is his guardian, remains insistent that patient will decompensate if he comes home while still having any degree of psychotic or manic symptoms.  We are continuing his current medicine.  I am not currently comfortable increasing doses as they are already pretty high with blood levels in the good range.  Encourage patient in attending groups.  We have referred him to [facility] and are waiting for some response.  Currently there is no other place for him to go as group homes or not open to taking new patients for the time being.  Mordecai Rasmussen, MD 11/16/2018, 1:37 PM

## 2018-11-16 NOTE — [facility] Group Notes (Signed)
Feelings Around Diagnosis 11/16/2018 1PM  Type of Therapy/Topic:  Group Therapy:  Feelings about Diagnosis  Participation Level:  Did Not Attend   Description of Group:   This group will allow patients to explore their thoughts and feelings about diagnoses they have received. Patients will be guided to explore their level of understanding and acceptance of these diagnoses. Facilitator will encourage patients to process their thoughts and feelings about the reactions of others to their diagnosis and will guide patients in identifying ways to discuss their diagnosis with significant others in their lives. This group will be process-oriented, with patients participating in exploration of their own experiences, giving and receiving support, and processing challenge from other group members.   Therapeutic Goals: 1. Patient will demonstrate understanding of diagnosis as evidenced by identifying two or more symptoms of the disorder 2. Patient will be able to express two feelings regarding the diagnosis 3. Patient will demonstrate their ability to communicate their needs through discussion and/or role play  Summary of Patient Progress:       Therapeutic Modalities:   Cognitive Behavioral Therapy Brief Therapy Feelings Identification    Suzan Slick, LCSW 11/16/2018 2:46 PM

## 2018-11-16 NOTE — Progress Notes (Signed)
Recreation Therapy Notes  Date: 11/16/2018  Time: 9:30 am   Location: Craft room   Behavioral response: N/A   Intervention Topic: Necessities   Discussion/Intervention: Patient did not attend group.   Clinical Observations/Feedback:  Patient did not attend group.   Anahis Furgeson LRT/CTRS         Danea Manter 11/16/2018 11:19 AM 

## 2018-11-16 NOTE — Progress Notes (Signed)
D: Pt during assessments denies SI/HI/AVH, able to contract for safety. Pt is mostly pleasant and cooperative. Pt. Endorses a mostly normal mood. Pt. Answers questions before this writer can ask them. Pt. Denies all. Pt. Continues to have magical thinking and delusions of grandeur.   A: Q x 15 minute observation checks were completed for safety. Patient was provided with education, but is non-accepting of this. Patient was given/offered medications per orders. Patient  was encourage to attend groups, participate in unit activities and continue with plan of care. Pt. Chart and plans of care reviewed. Pt. Given support and encouragement.   R: Patient is complaint with medications and unit procedures, but continues to be reluctant to take his medications. Pt. When taking medications continues to, "de-activate them". Pt. Does attend groups and snack time, eating good.            Precautionary checks every 15 minutes for safety maintained, room free of safety hazards, patient sustains no injury or falls during this shift. Will endorse care to next shift.

## 2018-11-16 NOTE — Plan of Care (Signed)
Pt. Self control improving. Pt. Denies si/hi/avh, able to remain safe while on the unit. Pt. Monitored for safety. Pt. Continues to present with magical thinking and delusions of grandeur.    Problem: Education: Goal: Will be free of psychotic symptoms Outcome: Not Progressing   Problem: Coping: Goal: Ability to demonstrate self-control will improve Outcome: Progressing   Problem: Safety: Goal: Periods of time without injury will increase Outcome: Progressing

## 2018-11-16 NOTE — Plan of Care (Signed)
Patient is alert and oriented X 3; denies SI, HI and AVH. Patient continues to have some magical thinking. Today patient took medications, and isolated majority of the time to his room, coming out for meals and medication. Patient rates pain 0/10. No self harming observations by patient at this time. 15 minute safety checks to continue. Problem: Education: Goal: Knowledge of [facility] Education information/materials will improve Outcome: Progressing   Problem: Coping: Goal: Ability to verbalize frustrations and anger appropriately will improve Outcome: Progressing   Problem: Safety: Goal: Periods of time without injury will increase Outcome: Progressing   Problem: Education: Goal: Will be free of psychotic symptoms Outcome: Progressing Goal: Knowledge of the prescribed therapeutic regimen will improve Outcome: Progressing

## 2018-11-17 NOTE — Progress Notes (Signed)
Patient is alert and oriented X 3, denies SI, HI and AVH. Patient is pleasant and cooperative with magical thinking. Patient rates pain 0/10. No self injurious behavior noted. Safety checks Q 15 minutes to continue.

## 2018-11-17 NOTE — [facility] Group Notes (Signed)
LCSW Group Therapy Note  11/17/2018 12:02 PM  Type of Therapy/Topic:  Group Therapy:  Emotion Regulation  Participation Level:  Did Not Attend   Description of Group:   The purpose of this group is to assist patients in learning to regulate negative emotions and experience positive emotions. Patients will be guided to discuss ways in which they have been vulnerable to their negative emotions. These vulnerabilities will be juxtaposed with experiences of positive emotions or situations, and patients will be challenged to use positive emotions to combat negative ones. Special emphasis will be placed on coping with negative emotions in conflict situations, and patients will process healthy conflict resolution skills.  Therapeutic Goals: 1. Patient will identify two positive emotions or experiences to reflect on in order to balance out negative emotions 2. Patient will label two or more emotions that they find the most difficult to experience 3. Patient will demonstrate positive conflict resolution skills through discussion and/or role plays  Summary of Patient Progress: x   Therapeutic Modalities:   Cognitive Behavioral Therapy Feelings Identification Dialectical Behavioral Therapy   Walker Paddack, MSW, LCSW Clinical Social Work 11/17/2018 12:02 PM    

## 2018-11-17 NOTE — Progress Notes (Signed)
[facility] MD Progress Note  11/17/2018 2:23 PM Jackson [name]  MRN:  [id number] Subjective: Follow-up for this patient with schizoaffective disorder.  Patient seen chart reviewed.  Patient was pleasant but still delusional.  Showed me a "hologram" by which he could control the back of my neck.  Did not seem malicious about it but sort of giggly as he was yesterday.  Goes to some groups but is often unable to stay very long because he is so restless.  Denies suicidal ideation.  No other new physical complaints. Principal Problem: Schizoaffective disorder, bipolar type (HCC) Diagnosis: Principal Problem:   Schizoaffective disorder, bipolar type (HCC) Active Problems:   Noncompliance  Total Time spent with patient: 20 minutes  Past Psychiatric History: Past history of long hospitalizations with extended times to recovery  Past Medical History:  Past Medical History:  Diagnosis Date  . Bipolar 1 disorder (HCC)   . Schizoaffective disorder (HCC)    History reviewed. No pertinent surgical history. Family History: History reviewed. No pertinent family history. Family Psychiatric  History: See previous Social History:  Social History   Substance and Sexual Activity  Alcohol Use Not on file     Social History   Substance and Sexual Activity  Drug Use Not on file    Social History   Socioeconomic History  . Marital status: Single    Spouse name: Not on file  . Number of children: Not on file  . Years of education: Not on file  . Highest education level: Not on file  Occupational History  . Not on file  Social Needs  . Financial resource strain: Not on file  . Food insecurity:    Worry: Not on file    Inability: Not on file  . Transportation needs:    Medical: Not on file    Non-medical: Not on file  Tobacco Use  . Smoking status: Light Tobacco Smoker    Packs/day: 0.25    Types: Cigarettes  . Smokeless tobacco: Never Used  Substance and Sexual Activity  . Alcohol use: Not  on file  . Drug use: Not on file  . Sexual activity: Not on file  Lifestyle  . Physical activity:    Days per week: Not on file    Minutes per session: Not on file  . Stress: Not on file  Relationships  . Social connections:    Talks on phone: Not on file    Gets together: Not on file    Attends religious service: Not on file    Active member of club or organization: Not on file    Attends meetings of clubs or organizations: Not on file    Relationship status: Not on file  Other Topics Concern  . Not on file  Social History Narrative  . Not on file   Additional Social History:                         Sleep: Fair  Appetite:  Fair  Current Medications: Current Facility-Administered Medications  Medication Dose Route Frequency Provider Last Rate Last Dose  . acetaminophen (TYLENOL) tablet 650 mg  650 mg Oral Q6H PRN [name], MD   650 mg at 10/26/18 2302  . alum & mag hydroxide-simeth (MAALOX/MYLANTA) 200-200-20 MG/5ML suspension 30 mL  30 mL Oral Q4H PRN [name], MD   30 mL at 10/26/18 2129  . ARIPiprazole (ABILIFY) tablet 15 mg  15 mg Oral Daily ,  [name], MD   15 mg at 11/17/18 0830  . divalproex (DEPAKOTE) DR tablet 1,000 mg  1,000 mg Oral Q12H ,  T, MD   1,000 mg at 11/17/18 0830  . hydrOXYzine (ATARAX/VISTARIL) tablet 50 mg  50 mg Oral Q4H PRN [name], MD   50 mg at 10/28/18 9470  . magnesium hydroxide (MILK OF MAGNESIA) suspension 30 mL  30 mL Oral Daily PRN [name], MD      . OLANZapine [facility]) injection 10 mg  10 mg Intramuscular Q6H PRN ,  T, MD      . OLANZapine (ZYPREXA) tablet 20 mg  20 mg Oral Q12H ,  T, MD   20 mg at 11/17/18 0830  . traZODone (DESYREL) tablet 100 mg  100 mg Oral QHS PRN [name], MD   100 mg at 11/12/18 2149    Lab Results:  Results for orders placed or performed during the hospital encounter of 10/17/18 (from the past 48 hour(s))   Valproic acid level     Status: Abnormal   Collection Time: 11/16/18  6:41 AM  Result Value Ref Range   Valproic Acid Lvl 117 (H) 50.0 - 100.0 ug/mL    Comment: Performed at [facility], [address] [place], [place] [address]    Blood Alcohol level:  Lab Results  Component Value Date   [facility] <10 10/16/2018    Metabolic Disorder Labs: Lab Results  Component Value Date   HGBA1C 5.2 10/23/2018   MPG 102.54 10/23/2018   No results found for: PROLACTIN Lab Results  Component Value Date   CHOL 129 10/23/2018   TRIG 58 10/23/2018   HDL 50 10/23/2018   CHOLHDL 2.6 10/23/2018   VLDL 12 10/23/2018   LDLCALC 67 10/23/2018    Physical Findings: AIMS: Facial and Oral Movements Muscles of Facial Expression: None, normal Lips and Perioral Area: None, normal Jaw: None, normal Tongue: None, normal,Extremity Movements Upper (arms, wrists, hands, fingers): None, normal Lower (legs, knees, ankles, toes): None, normal, Trunk Movements Neck, shoulders, hips: None, normal, Overall Severity Severity of abnormal movements (highest score from questions above): None, normal Incapacitation due to abnormal movements: None, normal Patient's awareness of abnormal movements (rate only patient's report): No Awareness, Dental Status Current problems with teeth and/or dentures?: No Does patient usually wear dentures?: No  CIWA:  CIWA-Ar Total: 0 COWS:  COWS Total Score: 0  Musculoskeletal: Strength & Muscle Tone: within normal limits Gait & Station: normal Patient leans: N/A  Psychiatric Specialty Exam: Physical Exam  Nursing note and vitals reviewed. Constitutional: He appears well-developed and well-nourished.  HENT:  Head: Normocephalic and atraumatic.  Eyes: Pupils are equal, round, and reactive to light. Conjunctivae are normal.  Neck: Normal range of motion.  Cardiovascular: Regular rhythm and normal heart sounds.  Respiratory: Effort normal. No respiratory distress.   GI: Soft.  Musculoskeletal: Normal range of motion.  Neurological: He is alert.  Skin: Skin is warm and dry.  Psychiatric: His affect is inappropriate. His speech is rapid and/or pressured. He is agitated. He is not aggressive. Thought content is delusional. Cognition and memory are impaired. He expresses impulsivity.    Review of Systems  Constitutional: Negative.   HENT: Negative.   Eyes: Negative.   Respiratory: Negative.   Cardiovascular: Negative.   Gastrointestinal: Negative.   Musculoskeletal: Negative.   Skin: Negative.   Neurological: Negative.   Psychiatric/Behavioral: Positive for hallucinations. Negative for depression, memory loss, substance abuse and suicidal ideas.  The patient is nervous/anxious. The patient does not have insomnia.     Blood pressure 101/66, pulse 71, temperature 97.8 F (36.6 C), temperature source Oral, resp. rate 16, height 5\' 11"  (1.803 m), weight 88.5 kg, SpO2 100 %.Body mass index is 27.2 kg/m.  General Appearance: Casual  Eye Contact:  Good  Speech:  Slow  Volume:  Decreased  Mood:  Euphoric  Affect:  Congruent  Thought Process:  Disorganized  Orientation:  Full (Time, Place, and Person)  Thought Content:  Illogical, Delusions and Ideas of Reference:   Paranoia  Suicidal Thoughts:  No  Homicidal Thoughts:  No  Memory:  Immediate;   Fair Recent;   Fair Remote;   Fair  Judgement:  Impaired  Insight:  Shallow  Psychomotor Activity:  Restlessness  Concentration:  Concentration: Fair  Recall:  [employer]  Fund of Knowledge:  Fair  Language:  Fair  Akathisia:  No  Handed:  Right  AIMS (if indicated):     Assets:  Desire for Improvement Physical Health Resilience Social Support  ADL's:  Impaired  Cognition:  Impaired,  Mild  Sleep:  Number of Hours: 7.5     Treatment Plan Summary: Daily contact with patient to assess and evaluate symptoms and progress in treatment, Medication management and Plan Behavior improved but still psychotic  with poor insight.  Continue current double mood stabilizers and antipsychotic while [facility].  Continue daily engagement and assessment.  Mordecai RasmussenJohn , MD 11/17/2018, 2:23 PM

## 2018-11-17 NOTE — Plan of Care (Signed)
Patient is alert and oriented X 3. Patient continues with magical thinking and spells over medications, but patient is compliant with medications during the day. Patient is pleasant on the unit, isolates most of the time in his room. Patient does leave out of room to eat. Patient did go outside today during a group session, but did not interact with any peers. Patient denies SI, HI and AVH. Safety checks Q 15 minutes continues. Problem: Education: Goal: Knowledge of [facility] Education information/materials will improve Outcome: Progressing   Problem: Coping: Goal: Ability to verbalize frustrations and anger appropriately will improve Outcome: Progressing Goal: Ability to demonstrate self-control will improve Outcome: Progressing   Problem: Safety: Goal: Periods of time without injury will increase Outcome: Progressing   Problem: Education: Goal: Will be free of psychotic symptoms Outcome: Progressing Goal: Knowledge of the prescribed therapeutic regimen will improve Outcome: Progressing

## 2018-11-17 NOTE — Progress Notes (Signed)
D - Patient was in his room upon arrival to the unit. Patient was pleasant during assessment and medication administration. Patient denies SI/HI/AVH, pain, anxiety and depression with this Clinical research associate. Patient presented with psychotic behavior stating to this writer, "I can slow down time and make it so that you live forever if you want." Patient states he could walk through the walls and get out of here if wanted but that the cops would just catch him and bring him back so he doesn't."   A - Patient was compliant with medication administration per MD orders and procedures on the unit. Patient given education. Patient given support and encouragement to be active in his treatment plan. Patient informed to let staff know if there are any issues or problems on the unit.   R - Patient being monitored Q 15 minutes for safety per unit protocol. Patient remains safe on the unit.

## 2018-11-17 NOTE — Progress Notes (Signed)
Recreation Therapy Notes  Date: 11/17/2018  Time: 9:30 am   Location: Craft room   Behavioral response: N/A   Intervention Topic: Relaxation    Discussion/Intervention: Patient did not attend group.   Clinical Observations/Feedback:  Patient did not attend group.   Raife Lizer LRT/CTRS        Jackson [name] 11/17/2018 11:54 AM

## 2018-11-17 NOTE — Plan of Care (Signed)
Patient presents with psychotic behaviors, believing he can manipulate time and space.   Problem: Education: Goal: Will be free of psychotic symptoms Outcome: Not Progressing

## 2018-11-18 MED ORDER — DIVALPROEX SODIUM 500 MG PO DR TAB
750.0000 mg | DELAYED_RELEASE_TABLET | Freq: Two times a day (BID) | ORAL | Status: DC
  Administered 2018-11-18 – 2018-11-25 (×14): 750 mg via ORAL
  Filled 2018-11-18 (×14): qty 1

## 2018-11-18 NOTE — Plan of Care (Signed)
Patient demonstrating remarkable compliance with medications and cooperating with staff . Shows amazing cooperation with staff as well as interacting appropriately with other patients. Contracts with staff to create a technique for achieving workable goals. Shows amazing self control as well. Problem: Education: Goal: Knowledge of [facility] Education information/materials will improve Outcome: Progressing   Problem: Coping: Goal: Ability to verbalize frustrations and anger appropriately will improve Outcome: Progressing Goal: Ability to demonstrate self-control will improve Outcome: Progressing   Problem: Safety: Goal: Periods of time without injury will increase Outcome: Progressing

## 2018-11-18 NOTE — Progress Notes (Signed)
D- Patient alert and cooperative.. Affect/mood impproving. Able to  Denies SI, HI, AVH, and pain. Quotes. Goal. How did they do with achieving previous goal. A- Scheduled medications administered to patient  per  Contract agreement. Support and encouragement provided.  Routine safety checks conducted every 15 minutes.  Patient informed to notify staff with problems or concerns. R- No adverse drug reactions noted. Patient contracts for safety at this time. Patient compliant with medications and treatment plan. Patient more complaint and pleasant.. Patient interacts well with others on the unit.  Patient remains safe at this time.

## 2018-11-18 NOTE — BHH Counselor (Signed)
CSW spoke with Amor at [facility].  CSW confirmed that patient is still at [facility] and in need of a bed for medication stabilization.  CSW was informed that no beds are available at this time.   Penni Homans, MSW, LCSW 11/18/2018 2:39 PM

## 2018-11-18 NOTE — BHH Group Notes (Signed)
LCSW Group Therapy Note  11/18/2018 1:00 PM  Type of Therapy/Topic:  Group Therapy:  Balance in Life  Participation Level:  Did Not Attend  Description of Group:    This group will address the concept of balance and how it feels and looks when one is unbalanced. Patients will be encouraged to process areas in their lives that are out of balance and identify reasons for remaining unbalanced. Facilitators will guide patients in utilizing problem-solving interventions to address and correct the stressor making their life unbalanced. Understanding and applying boundaries will be explored and addressed for obtaining and maintaining a balanced life. Patients will be encouraged to explore ways to assertively make their unbalanced needs known to significant others in their lives, using other group members and facilitator for support and feedback.  Therapeutic Goals: 1. Patient will identify two or more emotions or situations they have that consume much of in their lives. 2. Patient will identify signs/triggers that life has become out of balance:  3. Patient will identify two ways to set boundaries in order to achieve balance in their lives:  4. Patient will demonstrate ability to communicate their needs through discussion and/or role plays  Summary of Patient Progress: X  Therapeutic Modalities:   Cognitive Behavioral Therapy Solution-Focused Therapy Assertiveness Training  Penni Homans MSW, LCSW 11/18/2018 11:53 AM

## 2018-11-18 NOTE — Progress Notes (Signed)
Recreation Therapy Notes          Cortasia Screws 11/18/2018 10:46 AM

## 2018-11-18 NOTE — Plan of Care (Signed)
Patient presents with psychotic behaviors stating to this writer that he can control time.    Problem: Education: Goal: Will be free of psychotic symptoms Outcome: Not Progressing

## 2018-11-18 NOTE — Plan of Care (Signed)
D: Patient is cooperative with staff; he is compliant with his medications.  Patient denies any thoughts of self harm.  He denies any HI/AVH.  Patient has been referred to [facility] at the request of family.  A: Continue to monitor medication management and MD orders.  Safety checks completed every 15 minutes per protocol.  Offer support and encouragement as needed.  R: Patient is receptive to staff; his behavior is appropriate.    Problem: Education: Goal: Knowledge of the prescribed therapeutic regimen will improve Outcome: Progressing   Problem: Coping: Goal: Ability to demonstrate self-control will improve Outcome: Adequate for Discharge   Problem: Safety: Goal: Periods of time without injury will increase Outcome: Adequate for Discharge   Problem: Education: Goal: Will be free of psychotic symptoms Outcome: Adequate for Discharge

## 2018-11-18 NOTE — Progress Notes (Signed)
[facility] MD Progress Note  11/18/2018 2:59 PM Jackson [name]  MRN:  [id number] Subjective: Follow-up for this patient with schizoaffective disorder.  No new complaints.  Continues to have evidence of psychotic thinking disorganized thinking and behavior poor insight and poor self-care although he is not violent not threatening not suicidal.  He has been compliant with medicine.  Last Depakote level was a little elevated at 117. Principal Problem: Schizoaffective disorder, bipolar type (HCC) Diagnosis: Principal Problem:   Schizoaffective disorder, bipolar type (HCC) Active Problems:   Noncompliance  Total Time spent with patient: 20 minutes  Past Psychiatric History: History of multiple episodes of mania and bipolar disorder with psychosis  Past Medical History:  Past Medical History:  Diagnosis Date  . Bipolar 1 disorder (HCC)   . Schizoaffective disorder (HCC)    History reviewed. No pertinent surgical history. Family History: History reviewed. No pertinent family history. Family Psychiatric  History: See previous Social History:  Social History   Substance and Sexual Activity  Alcohol Use Not on file     Social History   Substance and Sexual Activity  Drug Use Not on file    Social History   Socioeconomic History  . Marital status: Single    Spouse name: Not on file  . Number of children: Not on file  . Years of education: Not on file  . Highest education level: Not on file  Occupational History  . Not on file  Social Needs  . Financial resource strain: Not on file  . Food insecurity:    Worry: Not on file    Inability: Not on file  . Transportation needs:    Medical: Not on file    Non-medical: Not on file  Tobacco Use  . Smoking status: Light Tobacco Smoker    Packs/day: 0.25    Types: Cigarettes  . Smokeless tobacco: Never Used  Substance and Sexual Activity  . Alcohol use: Not on file  . Drug use: Not on file  . Sexual activity: Not on file  Lifestyle   . Physical activity:    Days per week: Not on file    Minutes per session: Not on file  . Stress: Not on file  Relationships  . Social connections:    Talks on phone: Not on file    Gets together: Not on file    Attends religious service: Not on file    Active member of club or organization: Not on file    Attends meetings of clubs or organizations: Not on file    Relationship status: Not on file  Other Topics Concern  . Not on file  Social History Narrative  . Not on file   Additional Social History:                         Sleep: Fair  Appetite:  Fair  Current Medications: Current Facility-Administered Medications  Medication Dose Route Frequency Provider Last Rate Last Dose  . acetaminophen (TYLENOL) tablet 650 mg  650 mg Oral Q6H PRN [name], MD   650 mg at 10/26/18 2302  . alum & mag hydroxide-simeth (MAALOX/MYLANTA) 200-200-20 MG/5ML suspension 30 mL  30 mL Oral Q4H PRN [name], MD   30 mL at 10/26/18 2129  . ARIPiprazole (ABILIFY) tablet 15 mg  15 mg Oral Daily [name], [name], MD   15 mg at 11/18/18 0842  . divalproex (DEPAKOTE) DR tablet 1,000 mg  1,000 mg Oral  Q12H [name], [name], MD   1,000 mg at 11/18/18 0842  . hydrOXYzine (ATARAX/VISTARIL) tablet 50 mg  50 mg Oral Q4H PRN [name], MD   50 mg at 10/28/18 1102  . magnesium hydroxide (MILK OF MAGNESIA) suspension 30 mL  30 mL Oral Daily PRN [name], MD      . OLANZapine [facility]) injection 10 mg  10 mg Intramuscular Q6H PRN [name], [name], MD      . OLANZapine (ZYPREXA) tablet 20 mg  20 mg Oral Q12H [name], [name], MD   20 mg at 11/18/18 0842  . traZODone (DESYREL) tablet 100 mg  100 mg Oral QHS PRN [name], MD   100 mg at 11/12/18 2149    Lab Results: No results found for this or any previous visit (from the past 48 hour(s)).  Blood Alcohol level:  Lab Results  Component Value Date   ETH <10 10/16/2018    Metabolic Disorder Labs: Lab  Results  Component Value Date   HGBA1C 5.2 10/23/2018   MPG 102.54 10/23/2018   No results found for: PROLACTIN Lab Results  Component Value Date   CHOL 129 10/23/2018   TRIG 58 10/23/2018   HDL 50 10/23/2018   CHOLHDL 2.6 10/23/2018   VLDL 12 10/23/2018   LDLCALC 67 10/23/2018    Physical Findings: AIMS: Facial and Oral Movements Muscles of Facial Expression: None, normal Lips and Perioral Area: None, normal Jaw: None, normal Tongue: None, normal,Extremity Movements Upper (arms, wrists, hands, fingers): None, normal Lower (legs, knees, ankles, toes): None, normal, Trunk Movements Neck, shoulders, hips: None, normal, Overall Severity Severity of abnormal movements (highest score from questions above): None, normal Incapacitation due to abnormal movements: None, normal Patient's awareness of abnormal movements (rate only patient's report): No Awareness, Dental Status Current problems with teeth and/or dentures?: No Does patient usually wear dentures?: No  CIWA:  CIWA-Ar Total: 0 COWS:  COWS Total Score: 0  Musculoskeletal: Strength & Muscle Tone: within normal limits Gait & Station: normal Patient leans: N/A  Psychiatric Specialty Exam: Physical Exam  Nursing note and vitals reviewed. Constitutional: He appears well-developed and well-nourished.  HENT:  Head: Normocephalic and atraumatic.  Eyes: Pupils are equal, round, and reactive to light. Conjunctivae are normal.  Neck: Normal range of motion.  Cardiovascular: Regular rhythm and normal heart sounds.  Respiratory: Effort normal.  GI: Soft.  Musculoskeletal: Normal range of motion.  Neurological: He is alert.  Skin: Skin is warm and dry.  Psychiatric: His affect is labile. His speech is tangential. He is not agitated and not aggressive. Thought content is delusional. Cognition and memory are impaired. He expresses impulsivity.    Review of Systems  Constitutional: Negative.   HENT: Negative.   Eyes: Negative.    Respiratory: Negative.   Cardiovascular: Negative.   Gastrointestinal: Negative.   Musculoskeletal: Negative.   Skin: Negative.   Neurological: Negative.   Psychiatric/Behavioral: Negative.     Blood pressure 103/73, pulse 67, temperature 98.4 F (36.9 C), temperature source Oral, resp. rate 16, height 5\' 11"  (1.803 m), weight 88.5 kg, SpO2 100 %.Body mass index is 27.2 kg/m.  General Appearance: Disheveled  Eye Contact:  Fair  Speech:  Clear and Coherent  Volume:  Decreased  Mood:  Euthymic  Affect:  Constricted  Thought Process:  Disorganized  Orientation:  Full (Time, Place, and Person)  Thought Content:  Illogical, Rumination and Tangential  Suicidal Thoughts:  No  Homicidal Thoughts:  No  Memory:  Immediate;   Fair Recent;   Fair Remote;   Fair  Judgement:  Impaired  Insight:  Shallow  Psychomotor Activity:  Normal  Concentration:  Concentration: Fair  Recall:  [employer]Fair  Fund of Knowledge:  Fair  Language:  Fair  Akathisia:  No  Handed:  Right  AIMS (if indicated):     Assets:  Communication Skills Desire for Improvement Financial Resources/Insurance Resilience Social Support  ADL's:  Impaired  Cognition:  Impaired,  Mild  Sleep:  Number of Hours: 7.25     Treatment Plan Summary: Daily contact with patient to assess and evaluate symptoms and progress in treatment, Medication management and Plan Continues to have psychotic symptoms mania or grandiosity.  We are waiting for possible referral to [facility] at the request of his mother.  There are no group homes or other dispositions available at this time anyway.  I am cutting back a little bit on his Depakote because of the elevated level 2 days ago.  Otherwise no change to medicine today.  Supportive therapy and counseling.  Mordecai RasmussenJohn [name], MD 11/18/2018, 2:59 PM

## 2018-11-18 NOTE — Progress Notes (Signed)
D - Patient was in his room upon arrival to the unit. Patient was pleasant during assessment and medication administration. Patient denies SI/HI/AVH, pain, anxiety and depression with this Clinical research associate. Patient presented with psychotic behavior stating to this writer, "Let me see your watch, I can slow down or speed up your time." Patient received a phone call from his mother this evening and it upset him. Patient was crying after the phone call and stated that he didn't want to receive any more phone calls from her.   A - Patient was compliant with medication administration per MD orders and procedures on the unit. Patient given education. Patient given support and encouragement to be active in his treatment plan. Patient informed to let staff know if there are any issues or problems on the unit.   R - Patient being monitored Q 15 minutes for safety per unit protocol. Patient remains safe on the unit.

## 2018-11-19 NOTE — BHH Group Notes (Signed)
Feelings Around Relapse 11/19/2018 1PM  Type of Therapy and Topic:  Group Therapy:  Feelings around Relapse and Recovery  Participation Level:  Did Not Attend   Description of Group:    Patients in this group will discuss emotions they experience before and after a relapse. They will process how experiencing these feelings, or avoidance of experiencing them, relates to having a relapse. Facilitator will guide patients to explore emotions they have related to recovery. Patients will be encouraged to process which emotions are more powerful. They will be guided to discuss the emotional reaction significant others in their lives may have to patients' relapse or recovery. Patients will be assisted in exploring ways to respond to the emotions of others without this contributing to a relapse.  Therapeutic Goals: 1. Patient will identify two or more emotions that lead to a relapse for them 2. Patient will identify two emotions that result when they relapse 3. Patient will identify two emotions related to recovery 4. Patient will demonstrate ability to communicate their needs through discussion and/or role plays   Summary of Patient Progress:     Therapeutic Modalities:   Cognitive Behavioral Therapy Solution-Focused Therapy Assertiveness Training Relapse Prevention Therapy   Suzan Slick, LCSW 11/19/2018 2:12 PM

## 2018-11-19 NOTE — Plan of Care (Signed)
Patient remarkably improved from initial admission and several weeks after that. Has become more cooperative and less agitated, less aggressive and disoriented. He has been able to contract for when he will take his medicines. Interacts appropriately with other patients and verbalize some rational thoughts. Continues to deny SI/HI and periodically denies audiovisual hallucinations. Problem: Education: Goal: Knowledge of [facility] Education information/materials will improve 11/19/2018 0004 by Nicole Kindred, RN Outcome: Progressing 11/18/2018 2307 by Nicole Kindred, RN Outcome: Progressing   Problem: Coping: Goal: Ability to verbalize frustrations and anger appropriately will improve 11/19/2018 0004 by Nicole Kindred, RN Outcome: Progressing 11/18/2018 2307 by Nicole Kindred, RN Outcome: Progressing Goal: Ability to demonstrate self-control will improve 11/19/2018 0004 by Nicole Kindred, RN Outcome: Progressing 11/18/2018 2307 by Nicole Kindred, RN Outcome: Progressing   Problem: Safety: Goal: Periods of time without injury will increase 11/19/2018 0004 by Nicole Kindred, RN Outcome: Progressing 11/18/2018 2307 by Nicole Kindred, RN Outcome: Progressing   Problem: Education: Goal: Will be free of psychotic symptoms Outcome: Progressing Goal: Knowledge of the prescribed therapeutic regimen will improve Outcome: Progressing   Problem: Self-Concept: Goal: Will verbalize positive feelings about self Outcome: Progressing

## 2018-11-19 NOTE — Tx Team (Signed)
Interdisciplinary Treatment and Diagnostic Plan Update  11/19/2018 Time of Session: 8:30AM  Jackson [name] MRN: [id number]  Principal Diagnosis: Schizoaffective disorder, bipolar type [facility])  Secondary Diagnoses: Principal Problem:   Schizoaffective disorder, bipolar type (HCC) Active Problems:   Noncompliance   Current Medications:  Current Facility-Administered Medications  Medication Dose Route Frequency Provider Last Rate Last Dose  . acetaminophen (TYLENOL) tablet 650 mg  650 mg Oral Q6H PRN [name], MD   650 mg at 10/26/18 2302  . alum & mag hydroxide-simeth (MAALOX/MYLANTA) 200-200-20 MG/5ML suspension 30 mL  30 mL Oral Q4H PRN [name], MD   30 mL at 10/26/18 2129  . ARIPiprazole (ABILIFY) tablet 15 mg  15 mg Oral Daily [name], [name], MD   15 mg at 11/19/18 0816  . divalproex (DEPAKOTE) DR tablet 750 mg  750 mg Oral Q12H [name], [name], MD   750 mg at 11/19/18 0815  . hydrOXYzine (ATARAX/VISTARIL) tablet 50 mg  50 mg Oral Q4H PRN [name], MD   50 mg at 10/28/18 7412  . magnesium hydroxide (MILK OF MAGNESIA) suspension 30 mL  30 mL Oral Daily PRN [name], MD      . OLANZapine [facility]) injection 10 mg  10 mg Intramuscular Q6H PRN [name], [name], MD      . OLANZapine (ZYPREXA) tablet 20 mg  20 mg Oral Q12H [name], [name], MD   20 mg at 11/19/18 0815  . traZODone (DESYREL) tablet 100 mg  100 mg Oral QHS PRN [name], MD   100 mg at 11/12/18 2149   PTA Medications: No medications prior to admission.    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   Physician Treatment Plan for Primary Diagnosis: Schizoaffective disorder, bipolar type (HCC) Long Term  Goal(s): Improvement in symptoms so as ready for discharge Improvement in symptoms so as ready for discharge   Short Term Goals: Ability to demonstrate self-control will improve Ability to identify and develop effective coping behaviors will improve Compliance with prescribed medications will improve  Medication Management: Evaluate patient's response, side effects, and tolerance of medication regimen.  Therapeutic Interventions: 1 to 1 Cardella, Unit Group Blowe and Medication administration.  Evaluation of Outcomes: Progressing  Physician Treatment Plan for Secondary Diagnosis: Principal Problem:   Schizoaffective disorder, bipolar type (HCC) Active Problems:   Noncompliance  Long Term Goal(s): Improvement in symptoms so as ready for discharge Improvement in symptoms so as ready for discharge   Short Term Goals: Ability to demonstrate self-control will improve Ability to identify and develop effective coping behaviors will improve Compliance with prescribed medications will improve     Medication Management: Evaluate patient's response, side effects, and tolerance of medication regimen.  Therapeutic Interventions: 1 to 1 Godby, Unit Group Haldeman and Medication administration.  Evaluation of Outcomes: Progressing   RN Treatment Plan for Primary Diagnosis: Schizoaffective disorder, bipolar type (HCC) Long Term Goal(s): Knowledge of disease and therapeutic regimen to maintain health will improve  Short Term Goals: Ability to verbalize frustration and anger appropriately will improve, Ability to demonstrate self-control, Ability to participate in decision making will improve and Ability to verbalize feelings will improve  Medication Management: RN will administer medications as ordered by provider, will assess and evaluate patient's response and provide education to patient for prescribed  medication. RN will report any adverse and/or side effects to prescribing  provider.  Therapeutic Interventions: 1 on 1 counseling Springer, Psychoeducation, Medication administration, Evaluate responses to treatment, Monitor vital signs and CBGs as ordered, Perform/monitor CIWA, COWS, AIMS and Fall Risk screenings as ordered, Perform wound care treatments as ordered.  Evaluation of Outcomes: Progressing   LCSW Treatment Plan for Primary Diagnosis: Schizoaffective disorder, bipolar type (HCC) Long Term Goal(s): Safe transition to appropriate next level of care at discharge, Engage patient in therapeutic group addressing interpersonal concerns.  Short Term Goals: Engage patient in aftercare planning with referrals and resources, Increase social support, Increase ability to appropriately verbalize feelings, Increase emotional regulation and Facilitate acceptance of mental health diagnosis and concerns  Therapeutic Interventions: Assess for all discharge needs, 1 to 1 time with Social worker, Explore available resources and support systems, Assess for adequacy in community support network, Educate family and significant other(s) on suicide prevention, Complete Psychosocial Assessment, Interpersonal group therapy.  Evaluation of Outcomes: Progressing   Progress in Treatment: Attending groups: No. Participating in groups: No. Taking medication as prescribed: Yes.   Toleration medication: Yes. Family/Significant other contact made: Yes, individual(s) contacted:  CSW completed SPE with the patients mother.  Patient understands diagnosis: Yes. Discussing patient identified problems/goals with staff: Yes. Medical problems stabilized or resolved: Yes. Denies suicidal/homicidal ideation: Yes. Issues/concerns per patient self-inventory: No. Other: none  New problem(s) identified: No, Describe:  none  New Short Term/Long Term Goal(s): elimination of symptoms of psychosis, medication management for mood stabilization; elimination of SI thoughts; development of  comprehensive mental wellness plan.  Patient Goals:  "independence"  Discharge Plan or Barriers: Pt has a legal guardian, his mother. Per fellow CSWs mother continues to decline for the patient to be placed in a group home while he awaits [facility].  [facility], though as of last check on 11/18/2018 no beds were open at that time.  Nurses report that the patient is taking medications as prescribed.  Patient appears to be becoming isolative and does not interact with other patients in the milieu as he was previously.    Reason for Continuation of Hospitalization: Aggression Delusions  Hallucinations Mania Medication stabilization  Estimated Length of Stay: TBD  Recreational Therapy: Patient Stressors: N/A Patient Goal: Patient will focus on task/topic with 2 prompts from staff within 5 recreation therapy group Suman  Attendees: Patient:  11/19/2018 12:21 PM  Physician: Dr. Toni Amendlapacs, MD 11/19/2018 12:21 PM  Nursing:  11/19/2018 12:21 PM  RN Care Manager: 11/19/2018 12:21 PM  Social Worker: Penni HomansMichaela Venecia Mehl, LCSW 11/19/2018 12:21 PM  Recreational Therapist:  11/19/2018 12:21 PM  Other:  11/19/2018 12:21 PM  Other:  11/19/2018 12:21 PM  Other: 11/19/2018 12:21 PM    Scribe for Treatment Team: Harden MoMichaela J Nobuo Nunziata, LCSW 11/19/2018 12:21 PM

## 2018-11-19 NOTE — Plan of Care (Signed)
Patient is pleasant and cooperative today.Compliant with medications.No aggressive behaviors noted.Denies SI,HI and AVH.Support and encouragement given.

## 2018-11-19 NOTE — Progress Notes (Signed)
Recreation Therapy Notes   Date: 11/19/2018  Time: 9:30 am   Location: Craft room   Behavioral response: N/A   Intervention Topic: Emotions  Discussion/Intervention: Patient did not attend group.   Clinical Observations/Feedback:  Patient did not attend group.   Drakkar Medeiros LRT/CTRS        Aleesa Sweigert 11/19/2018 10:54 AM

## 2018-11-19 NOTE — Progress Notes (Signed)
[facility] MD Progress Note  11/19/2018 3:30 PM Jackson [name]  MRN:  [id number] Subjective: Follow-up for this patient with schizoaffective disorder.  Patient was calm and pleasant during our interaction today.  He had no specific complaints.  He is eating well and taking his medicine without any current complaints of side effects.  He continues to be delusional and unusual in his thinking although he seems to be enjoying himself.  No sign of any actually dangerous behavior no suicidality.  He has not been hostile or aggressive.  He was able to have a pretty pleasant conversation with me today without ever losing his temper. Principal Problem: Schizoaffective disorder, bipolar type (HCC) Diagnosis: Principal Problem:   Schizoaffective disorder, bipolar type (HCC) Active Problems:   Noncompliance  Total Time spent with patient: 30 minutes  Past Psychiatric History: Patient has a history of bipolar or schizoaffective disorder.  Has had lengthy hospitalizations in the past.  Current episode follows discontinuation of medicine  Past Medical History:  Past Medical History:  Diagnosis Date  . Bipolar 1 disorder (HCC)   . Schizoaffective disorder (HCC)    History reviewed. No pertinent surgical history. Family History: History reviewed. No pertinent family history. Family Psychiatric  History: Nothing known Social History:  Social History   Substance and Sexual Activity  Alcohol Use Not on file     Social History   Substance and Sexual Activity  Drug Use Not on file    Social History   Socioeconomic History  . Marital status: Single    Spouse name: Not on file  . Number of children: Not on file  . Years of education: Not on file  . Highest education level: Not on file  Occupational History  . Not on file  Social Needs  . Financial resource strain: Not on file  . Food insecurity:    Worry: Not on file    Inability: Not on file  . Transportation needs:    Medical: Not on file     Non-medical: Not on file  Tobacco Use  . Smoking status: Light Tobacco Smoker    Packs/day: 0.25    Types: Cigarettes  . Smokeless tobacco: Never Used  Substance and Sexual Activity  . Alcohol use: Not on file  . Drug use: Not on file  . Sexual activity: Not on file  Lifestyle  . Physical activity:    Days per week: Not on file    Minutes per session: Not on file  . Stress: Not on file  Relationships  . Social connections:    Talks on phone: Not on file    Gets together: Not on file    Attends religious service: Not on file    Active member of club or organization: Not on file    Attends meetings of clubs or organizations: Not on file    Relationship status: Not on file  Other Topics Concern  . Not on file  Social History Narrative  . Not on file   Additional Social History:                         Sleep: Fair  Appetite:  Fair  Current Medications: Current Facility-Administered Medications  Medication Dose Route Frequency Provider Last Rate Last Dose  . acetaminophen (TYLENOL) tablet 650 mg  650 mg Oral Q6H PRN [name], MD   650 mg at 10/26/18 2302  . alum & mag hydroxide-simeth (MAALOX/MYLANTA) 200-200-20 MG/5ML suspension 30 mL  30 mL Oral Q4H PRN Terance HartStephens, [name], MD   30 mL at 10/26/18 2129  . ARIPiprazole (ABILIFY) tablet 15 mg  15 mg Oral Daily Rumaldo Difatta, Jackquline DenmarkJohn T, MD   15 mg at 11/19/18 0816  . divalproex (DEPAKOTE) DR tablet 750 mg  750 mg Oral Q12H Veria Stradley T, MD   750 mg at 11/19/18 0815  . hydrOXYzine (ATARAX/VISTARIL) tablet 50 mg  50 mg Oral Q4H PRN Terance HartStephens, [name], MD   50 mg at 10/28/18 [id number]  . magnesium hydroxide (MILK OF MAGNESIA) suspension 30 mL  30 mL Oral Daily PRN Terance HartStephens, [name], MD      . OLANZapine [facility](ZYPREXA) injection 10 mg  10 mg Intramuscular Q6H PRN Kasmira Cacioppo T, MD      . OLANZapine (ZYPREXA) tablet 20 mg  20 mg Oral Q12H Camara Renstrom, Jackquline DenmarkJohn T, MD   20 mg at 11/19/18 0815  . traZODone (DESYREL) tablet 100 mg   100 mg Oral QHS PRN Terance HartStephens, [name], MD   100 mg at 11/12/18 2149    Lab Results: No results found for this or any previous visit (from the past 48 hour(s)).  Blood Alcohol level:  Lab Results  Component Value Date   ETH <10 10/16/2018    Metabolic Disorder Labs: Lab Results  Component Value Date   HGBA1C 5.2 10/23/2018   MPG 102.54 10/23/2018   No results found for: PROLACTIN Lab Results  Component Value Date   CHOL 129 10/23/2018   TRIG 58 10/23/2018   HDL 50 10/23/2018   CHOLHDL 2.6 10/23/2018   VLDL 12 10/23/2018   LDLCALC 67 10/23/2018    Physical Findings: AIMS: Facial and Oral Movements Muscles of Facial Expression: None, normal Lips and Perioral Area: None, normal Jaw: None, normal Tongue: None, normal,Extremity Movements Upper (arms, wrists, hands, fingers): None, normal Lower (legs, knees, ankles, toes): None, normal, Trunk Movements Neck, shoulders, hips: None, normal, Overall Severity Severity of abnormal movements (highest score from questions above): None, normal Incapacitation due to abnormal movements: None, normal Patient's awareness of abnormal movements (rate only patient's report): No Awareness, Dental Status Current problems with teeth and/or dentures?: No Does patient usually wear dentures?: No  CIWA:  CIWA-Ar Total: 0 COWS:  COWS Total Score: 0  Musculoskeletal: Strength & Muscle Tone: within normal limits Gait & Station: normal Patient leans: N/A  Psychiatric Specialty Exam: Physical Exam  Nursing note and vitals reviewed. Constitutional: He appears well-developed and well-nourished.  HENT:  Head: Normocephalic and atraumatic.  Eyes: Pupils are equal, round, and reactive to light. Conjunctivae are normal.  Neck: Normal range of motion.  Cardiovascular: Regular rhythm and normal heart sounds.  Respiratory: Effort normal. No respiratory distress.  GI: Soft.  Musculoskeletal: Normal range of motion.  Neurological: He is alert.   Skin: Skin is warm and dry.  Psychiatric: He has a normal mood and affect. His speech is tangential. He is not agitated. Thought content is delusional. Thought content is not paranoid. Cognition and memory are normal. He expresses impulsivity.    Review of Systems  Constitutional: Negative.   HENT: Negative.   Eyes: Negative.   Respiratory: Negative.   Cardiovascular: Negative.   Gastrointestinal: Negative.   Musculoskeletal: Negative.   Skin: Negative.   Neurological: Negative.   Psychiatric/Behavioral: Negative for depression, hallucinations, memory loss, substance abuse and suicidal ideas. The patient is not nervous/anxious and does not have insomnia.     Blood pressure 112/72, pulse 65, temperature 97.8 F (36.6 C), temperature source  Oral, resp. rate 16, height 5\' 11"  (1.803 m), weight 88.5 kg, SpO2 100 %.Body mass index is 27.2 kg/m.  General Appearance: Casual  Eye Contact:  Good  Speech:  Clear and Coherent  Volume:  Normal  Mood:  Euthymic  Affect:  Appropriate  Thought Process:  Goal Directed  Orientation:  Full (Time, Place, and Person)  Thought Content:  Logical  Suicidal Thoughts:  No  Homicidal Thoughts:  No  Memory:  Immediate;   Fair Recent;   Fair Remote;   Fair  Judgement:  Fair  Insight:  Fair  Psychomotor Activity:  Normal  Concentration:  Concentration: Fair  Recall:  [employer] of Knowledge:  Fair  Language:  Fair  Akathisia:  No  Handed:  Right  AIMS (if indicated):     Assets:  Desire for Improvement Physical Health Resilience Social Support  ADL's:  Impaired  Cognition:  WNL  Sleep:  Number of Hours: 7.25     Treatment Plan Summary: Daily contact with patient to assess and evaluate symptoms and progress in treatment, Medication management and Plan Pleasant gentleman who currently is behaving fine.  Ongoing psychosis.  Not sure how much different that would be from ultimate baseline although his mother insists that at full baseline he has  no symptoms at all.  We have been waiting for a referral to [facility] although how likely that is to happen is unsure.  In any case we have no other disposition right now as there are no group homes available and patient has a guardian.  No change to medicine treatment.  I cut down his Depakote because of his elevated level last time we will probably recheck it again after the weekend.  Jackson Rasmussen, MD 11/19/2018, 3:30 PM

## 2018-11-19 NOTE — Progress Notes (Signed)
Patient has been appropriate in the unit and demonstrating self control and maintaining safety, no self injurious behaviors , compliant with his PM medications and maintaining safety guidelines, denies any SI/HI/AVH, reports that he is having fun with some bizarre behavior with his hands, denies any depressions and anxiety at this time, thought process is adequate with the circumstances, patient is stable and participating in group activities with peers , sleep is good and appetite is good . Only requiring 15 minute safety checks, no distress

## 2018-11-19 NOTE — Plan of Care (Signed)
  Problem: Education: Goal: Knowledge of [facility] Education information/materials will improve Outcome: Progressing   Problem: Coping: Goal: Ability to verbalize frustrations and anger appropriately will improve Outcome: Progressing Goal: Ability to demonstrate self-control will improve Outcome: Progressing   Problem: Safety: Goal: Periods of time without injury will increase Outcome: Progressing   Problem: Education: Goal: Will be free of psychotic symptoms Outcome: Progressing Goal: Knowledge of the prescribed therapeutic regimen will improve Outcome: Progressing   Problem: Self-Concept: Goal: Will verbalize positive feelings about self Outcome: Progressing   Problem: Education: Goal: Ability to state activities that reduce stress will improve Outcome: Progressing   

## 2018-11-20 NOTE — Plan of Care (Signed)
Patient stays in room most of the time.No issues verbalized.Denies SI,HI and AVH.Compliant with medications.No irritable behaviors noted.Did not attend group.Support and encouragement given.

## 2018-11-20 NOTE — Progress Notes (Signed)
[facility]  11/20/2018 12:50 PM Jackson [name]  MRN:  [id number] Subjective: Follow-up for this patient with schizoaffective disorder.   Pt seen and chart reviewed. Jackson [name] just was taking a nap after breakfast.  Jackson [name] said that everything is going well, and has no complaints.    No SI, HI, no AVH or paranoia. Jackson [name] has been appropriate on the unit, no behavioral issues.  Jackson [name] is compliant with his meds.   Principal Problem: Schizoaffective disorder, bipolar type (HCC) Diagnosis: Principal Problem:   Schizoaffective disorder, bipolar type (HCC) Active Problems:   Noncompliance  Total Time spent with patient: 20 minutes  Past Psychiatric History: Patient has a history of bipolar or schizoaffective disorder.  Has had lengthy hospitalizations in the past.  Current episode follows discontinuation of medicine  Past Medical History:  Past Medical History:  Diagnosis Date  . Bipolar 1 disorder (HCC)   . Schizoaffective disorder (HCC)    History reviewed. No pertinent surgical history. Family History: History reviewed. No pertinent family history. Family Psychiatric  History: Nothing known Social History:  Social History   Substance and Sexual Activity  Alcohol Use Not on file     Social History   Substance and Sexual Activity  Drug Use Not on file    Social History   Socioeconomic History  . Marital status: Single    Spouse name: Not on file  . Number of children: Not on file  . Years of education: Not on file  . Highest education level: Not on file  Occupational History  . Not on file  Social Needs  . Financial resource strain: Not on file  . Food insecurity:    Worry: Not on file    Inability: Not on file  . Transportation needs:    Medical: Not on file    Non-medical: Not on file  Tobacco Use  . Smoking status: Light Tobacco Smoker    Packs/day: 0.25    Types: Cigarettes  . Smokeless tobacco: Never Used  Substance and Sexual Activity  . Alcohol use: Not on file   . Drug use: Not on file  . Sexual activity: Not on file  Lifestyle  . Physical activity:    Days per week: Not on file    Minutes per session: Not on file  . Stress: Not on file  Relationships  . Social connections:    Talks on phone: Not on file    Gets together: Not on file    Attends religious service: Not on file    Active member of club or organization: Not on file    Attends meetings of clubs or organizations: Not on file    Relationship status: Not on file  Other Topics Concern  . Not on file  Social History Narrative  . Not on file   Additional Social History:   Sleep: Fair  Appetite:  Fair  Current Medications: Current Facility-Administered Medications  Medication Dose Route Frequency Provider Last Rate Last Dose  . acetaminophen (TYLENOL) tablet 650 mg  650 mg Oral Q6H PRN Terance HartStephens, [name], MD   650 mg at 10/26/18 2302  . alum & mag hydroxide-simeth (MAALOX/MYLANTA) 200-200-20 MG/5ML suspension 30 mL  30 mL Oral Q4H PRN Terance HartStephens, [name], MD   30 mL at 10/26/18 2129  . ARIPiprazole (ABILIFY) tablet 15 mg  15 mg Oral Daily [name], Jackquline DenmarkJohn T, MD   15 mg at 11/20/18 0759  . divalproex (DEPAKOTE) DR tablet 750 mg  750 mg Oral  Q12H [name], [name], MD   750 mg at 11/20/18 0759  . hydrOXYzine (ATARAX/VISTARIL) tablet 50 mg  50 mg Oral Q4H PRN [name], MD   50 mg at 10/28/18 1610  . magnesium hydroxide (MILK OF MAGNESIA) suspension 30 mL  30 mL Oral Daily PRN [name], MD      . OLANZapine [facility]) injection 10 mg  10 mg Intramuscular Q6H PRN [name], [name], MD      . OLANZapine (ZYPREXA) tablet 20 mg  20 mg Oral Q12H [name], [name], MD   20 mg at 11/20/18 0759  . traZODone (DESYREL) tablet 100 mg  100 mg Oral QHS PRN [name], MD   100 mg at 11/12/18 2149    Lab Results: No results found for this or any previous visit (from the past 48 hour(s)).  Blood Alcohol level:  Lab Results  Component Value Date   ETH <10 10/16/2018     Metabolic Disorder Labs: Lab Results  Component Value Date   HGBA1C 5.2 10/23/2018   MPG 102.54 10/23/2018   No results found for: PROLACTIN Lab Results  Component Value Date   CHOL 129 10/23/2018   TRIG 58 10/23/2018   HDL 50 10/23/2018   CHOLHDL 2.6 10/23/2018   VLDL 12 10/23/2018   LDLCALC 67 10/23/2018    Physical Findings: AIMS: Facial and Oral Movements Muscles of Facial Expression: None, normal Lips and Perioral Area: None, normal Jaw: None, normal Tongue: None, normal,Extremity Movements Upper (arms, wrists, hands, fingers): None, normal Lower (legs, knees, ankles, toes): None, normal, Trunk Movements Neck, shoulders, hips: None, normal, Overall Severity Severity of abnormal movements (highest score from questions above): None, normal Incapacitation due to abnormal movements: None, normal Patient's awareness of abnormal movements (rate only patient's report): No Awareness, Dental Status Current problems with teeth and/or dentures?: No Does patient usually wear dentures?: No  CIWA:  CIWA-Ar Total: 0 COWS:  COWS Total Score: 0  Musculoskeletal: Strength & Muscle Tone: within normal limits Gait & Station: normal Patient leans: N/A  Psychiatric Specialty Exam: Physical Exam  Nursing note and vitals reviewed. Constitutional: Jackson [name] appears well-developed and well-nourished.  HENT:  Head: Normocephalic and atraumatic.  Eyes: Pupils are equal, round, and reactive to light. Conjunctivae are normal.  Neck: Normal range of motion.  Cardiovascular: Regular rhythm and normal heart sounds.  Respiratory: Effort normal. No respiratory distress.  GI: Soft.  Musculoskeletal: Normal range of motion.  Neurological: Jackson [name] is alert.  Skin: Skin is warm and dry.  Psychiatric: Jackson [name] has a normal mood and affect. His speech is tangential. Jackson [name] is not agitated. Thought content is delusional. Thought content is not paranoid. Cognition and memory are normal. Jackson [name] expresses impulsivity.     Review of Systems  Constitutional: Negative.   HENT: Negative.   Eyes: Negative.   Respiratory: Negative.   Cardiovascular: Negative.   Gastrointestinal: Negative.   Musculoskeletal: Negative.   Skin: Negative.   Neurological: Negative.   Psychiatric/Behavioral: Negative for depression, hallucinations, memory loss, substance abuse and suicidal ideas. The patient is not nervous/anxious and does not have insomnia.     Blood pressure 102/69, pulse 68, temperature 97.9 F (36.6 C), temperature source Oral, resp. rate 16, height  (1.803 m), weight 88.5 kg, SpO2 100 %.Body mass index is 27.2 kg/m.  General Appearance: Casual  Eye Contact:  Good  Speech:  Clear and Coherent  Volume:  Normal  Mood:  Euthymic  Affect:  Appropriate  Thought Process:  Goal Directed  Orientation:  Full (Time, Place, and Person)  Thought Content:  Logical  Suicidal Thoughts:  No  Homicidal Thoughts:  No  Memory:  Immediate;   Fair Recent;   Fair Remote;   Fair  Judgement:  Fair  Insight:  Fair  Psychomotor Activity:  Normal  Concentration:  Concentration: Fair  Recall:  [employer] of Knowledge:  Fair  Language:  Fair  Akathisia:  No  Handed:  Right  AIMS (if indicated):     Assets:  Desire for Improvement Physical Health Resilience Social Support  ADL's:  Impaired  Cognition:  WNL  Sleep:  Number of Hours: 3     Treatment Plan Summary: Medication management   Pleasant gentleman who currently is behaving fine.  Ongoing psychosis.  Not sure how much different that would be from ultimate baseline although his mother insists that at full baseline Rayvn Rickerson has no symptoms at all.  Tasheka Houseman has been fairly stable on the unit, waiting for safe disposition.   # Schizoaffective disorder -- continue Abilify 15mg  daily.  -- continue Zyprexa 10mg  BID.  -- continue Depakote DR 750mg  BID (reduced), plan to recheck VPA next week.  -- continue provide zyprexa IM prn.   # Disposition -- Referral sent to  [facility].  -- currently no group home available and pt has a legal guardian.    Nedda Gains, MD 11/20/2018, 12:50 PM

## 2018-11-20 NOTE — [facility] Group Notes (Signed)
LCSW Group Therapy Note  11/20/2018 1:15pm  Type of Therapy and Topic: Group Therapy: Holding on to Grudges   Participation Level: Did Not Attend   Description of Group:  In this group patients will be asked to explore and define a grudge. Patients will be guided to discuss their thoughts, feelings, and reasons as to why people have grudges. Patients will process the impact grudges have on daily life and identify thoughts and feelings related to holding grudges. Facilitator will challenge patients to identify ways to let go of grudges and the benefits this provides. Patients will be confronted to address why one struggles letting go of grudges. Lastly, patients will identify feelings and thoughts related to what life would look like without grudges. This group will be process-oriented, with patients participating in exploration of their own experiences, giving and receiving support, and processing challenge from other group members.  Therapeutic Goals:  1. Patient will identify specific grudges related to their personal life.  2. Patient will identify feelings, thoughts, and beliefs around grudges.  3. Patient will identify how one releases grudges appropriately.  4. Patient will identify situations where they could have let go of the grudge, but instead chose to hold on.   Summary of Patient Progress: Pt was invited to attend group but chose not to attend. CSW will continue to encourage pt to attend group throughout their admission.    Therapeutic Modalities:  Cognitive Behavioral Therapy  Solution Focused Therapy  Motivational Interviewing  Brief Therapy   Haruye Lainez  [name], LCSW 11/20/2018 12:08 PM

## 2018-11-20 NOTE — Progress Notes (Signed)
D: Pt. During assessments denies si/hi, able to contract for safety. Pt. Continues to present with magical thinking about, "building holograms" and express delusions of owning a business. Pt. Also continues to, "deactivate the medications". Pt. Is pleasant and cooperative, engagement is good. Pt. Endorses a mostly normal mood. Pt. When interacting with this writer described mild visual hallucinations, but no observations of responding to internal stimuli.   A: Q x 15 minute observation checks were completed for safety. Patient was provided with education, but is non-accepting of this still. Patient was given/offered medications per orders. Patient  was encourage to attend groups, participate in unit activities and continue with plan of care. Pt. Chart and plans of care reviewed. Pt. Given support and encouragement.   R: Patient is complaint with medications and unit procedures with direction and encouragement. Pt. Eating good, observed attending snack time. Pt. covid screening completed. Pt. Still reluctant to take his medications, but takes them.              Precautionary checks every 15 minutes for safety maintained, room free of safety hazards, patient sustains no injury or falls during this shift. Will endorse care to next shift.

## 2018-11-20 NOTE — Plan of Care (Signed)
Pt. Self-control is improved this evening. Pt. Denies si/hi, able to contract for safety. Pt. Monitored for safety on the unit. Pt. Continues to express magical thinking and delusional ideas.    Problem: Education: Goal: Will be free of psychotic symptoms Outcome: Not Progressing   Problem: Coping: Goal: Ability to demonstrate self-control will improve Outcome: Progressing   Problem: Safety: Goal: Periods of time without injury will increase Outcome: Progressing

## 2018-11-21 NOTE — Plan of Care (Signed)
Pt. Self control improving. Pt. Denies si/hi, able to contract for safety. Pt. Monitored by staff for safety. Pt. Continues to present with magical thinking and delusional thoughts.    Problem: Education: Goal: Will be free of psychotic symptoms Outcome: Not Progressing   Problem: Coping: Goal: Ability to demonstrate self-control will improve Outcome: Progressing   Problem: Safety: Goal: Periods of time without injury will increase Outcome: Progressing

## 2018-11-21 NOTE — [facility] Group Notes (Addendum)
LCSW Group Therapy Note 11/21/2018 1:15pm  Type of Therapy and Topic: Group Therapy: Feelings Around Returning Home & Establishing a Supportive Framework and Supporting Oneself When Supports Not Available  Participation Level: Minimal  Description of Group:  Patients first processed thoughts and feelings about upcoming discharge. These included fears of upcoming changes, lack of change, new living environments, judgements and expectations from others and overall stigma of mental health issues. The group then discussed the definition of a supportive framework, what that looks and feels like, and how do to discern it from an unhealthy non-supportive network. The group identified different types of supports as well as what to do when your family/friends are less than helpful or unavailable  Therapeutic Goals  1. Patient will identify one healthy supportive network that they can use at discharge. 2. Patient will identify one factor of a supportive framework and how to tell it from an unhealthy network. 3. Patient able to identify one coping skill to use when they do not have positive supports from others. 4. Patient will demonstrate ability to communicate their needs through discussion and/or role plays.  Summary of Patient Progress:  The patient arrived to group towards the end of discussion. When it was his turn to speak he focus on having a guardian and not having any patient rights.   Therapeutic Modalities Cognitive Behavioral Therapy Motivational Interviewing   Johnnye Sima, LCSW 11/21/2018 12:29 PM

## 2018-11-21 NOTE — Progress Notes (Signed)
[facility] MD Progress Note  11/21/2018 11:27 AM Jackson [name]  MRN:  [id number] Subjective: Follow-up for this patient with schizoaffective disorder.    Pt seen and chart reviewed. Jackson [name] still feels sleep around 10 AM.  Jackson [name] did not want to talk much, but stated that everything is "ok" and has no complaints.   RN reported that Patient believes that Jackson [name] has magical thinking, and can deactivate medication by placing a spell over them. Patient is pleasant,but mostly isolates in his room   No SI, HI, no AVH or paranoia. Jackson [name] has been appropriate on the unit, no behavioral issues.  Jackson [name] is compliant with his meds.   Principal Problem: Schizoaffective disorder, bipolar type (HCC) Diagnosis: Principal Problem:   Schizoaffective disorder, bipolar type (HCC) Active Problems:   Noncompliance  Total Time spent with patient: 20 minutes  Past Psychiatric History: Patient has a history of bipolar or schizoaffective disorder.  Has had lengthy hospitalizations in the past.  Current episode follows discontinuation of medicine  Past Medical History:  Past Medical History:  Diagnosis Date  . Bipolar 1 disorder (HCC)   . Schizoaffective disorder (HCC)    History reviewed. No pertinent surgical history. Family History: History reviewed. No pertinent family history. Family Psychiatric  History: Nothing known Social History:  Social History   Substance and Sexual Activity  Alcohol Use Not on file     Social History   Substance and Sexual Activity  Drug Use Not on file    Social History   Socioeconomic History  . Marital status: Single    Spouse name: Not on file  . Number of children: Not on file  . Years of education: Not on file  . Highest education level: Not on file  Occupational History  . Not on file  Social Needs  . Financial resource strain: Not on file  . Food insecurity:    Worry: Not on file    Inability: Not on file  . Transportation needs:    Medical: Not on file    Non-medical: Not  on file  Tobacco Use  . Smoking status: Light Tobacco Smoker    Packs/day: 0.25    Types: Cigarettes  . Smokeless tobacco: Never Used  Substance and Sexual Activity  . Alcohol use: Not on file  . Drug use: Not on file  . Sexual activity: Not on file  Lifestyle  . Physical activity:    Days per week: Not on file    Minutes per session: Not on file  . Stress: Not on file  Relationships  . Social connections:    Talks on phone: Not on file    Gets together: Not on file    Attends religious service: Not on file    Active member of club or organization: Not on file    Attends meetings of clubs or organizations: Not on file    Relationship status: Not on file  Other Topics Concern  . Not on file  Social History Narrative  . Not on file   Additional Social History:   Sleep: Fair  Appetite:  Fair  Current Medications: Current Facility-Administered Medications  Medication Dose Route Frequency Provider Last Rate Last Dose  . acetaminophen (TYLENOL) tablet 650 mg  650 mg Oral Q6H PRN [name], MD   650 mg at 10/26/18 2302  . alum & mag hydroxide-simeth (MAALOX/MYLANTA) 200-200-20 MG/5ML suspension 30 mL  30 mL Oral Q4H PRN [name], MD   30 mL at 10/26/18 2129  .  ARIPiprazole (ABILIFY) tablet 15 mg  15 mg Oral Daily [name], Jackquline DenmarkJohn T, MD   15 mg at 11/21/18 0802  . divalproex (DEPAKOTE) DR tablet 750 mg  750 mg Oral Q12H [name], Jackquline DenmarkJohn T, MD   750 mg at 11/21/18 0801  . hydrOXYzine (ATARAX/VISTARIL) tablet 50 mg  50 mg Oral Q4H PRN Terance HartStephens, [name], MD   50 mg at 10/28/18 [id number]  . magnesium hydroxide (MILK OF MAGNESIA) suspension 30 mL  30 mL Oral Daily PRN Terance HartStephens, [name], MD      . OLANZapine [facility](ZYPREXA) injection 10 mg  10 mg Intramuscular Q6H PRN [name], [name], MD      . OLANZapine (ZYPREXA) tablet 20 mg  20 mg Oral Q12H [name], Jackquline DenmarkJohn T, MD   20 mg at 11/21/18 0802  . traZODone (DESYREL) tablet 100 mg  100 mg Oral QHS PRN Terance HartStephens, [name], MD   100 mg  at 11/12/18 2149    Lab Results: No results found for this or any previous visit (from the past 48 hour(s)).  Blood Alcohol level:  Lab Results  Component Value Date   ETH <10 10/16/2018    Metabolic Disorder Labs: Lab Results  Component Value Date   HGBA1C 5.2 10/23/2018   MPG 102.54 10/23/2018   No results found for: PROLACTIN Lab Results  Component Value Date   CHOL 129 10/23/2018   TRIG 58 10/23/2018   HDL 50 10/23/2018   CHOLHDL 2.6 10/23/2018   VLDL 12 10/23/2018   LDLCALC 67 10/23/2018    Physical Findings: AIMS: Facial and Oral Movements Muscles of Facial Expression: None, normal Lips and Perioral Area: None, normal Jaw: None, normal Tongue: None, normal,Extremity Movements Upper (arms, wrists, hands, fingers): None, normal Lower (legs, knees, ankles, toes): None, normal, Trunk Movements Neck, shoulders, hips: None, normal, Overall Severity Severity of abnormal movements (highest score from questions above): None, normal Incapacitation due to abnormal movements: None, normal Patient's awareness of abnormal movements (rate only patient's report): No Awareness, Dental Status Current problems with teeth and/or dentures?: No Does patient usually wear dentures?: No  CIWA:  CIWA-Ar Total: 0 COWS:  COWS Total Score: 0  Musculoskeletal: Strength & Muscle Tone: within normal limits Gait & Station: normal Patient leans: N/A  Psychiatric Specialty Exam: Physical Exam  Nursing note and vitals reviewed. Constitutional: Jackson [name] appears well-developed and well-nourished.  HENT:  Head: Normocephalic and atraumatic.  Eyes: Pupils are equal, round, and reactive to light. Conjunctivae are normal.  Neck: Normal range of motion.  Cardiovascular: Regular rhythm and normal heart sounds.  Respiratory: Effort normal. No respiratory distress.  GI: Soft.  Musculoskeletal: Normal range of motion.  Neurological: Jackson [name] is alert.  Skin: Skin is warm and dry.  Psychiatric: Jackson [name] has a  normal mood and affect. His speech is tangential. Jackson [name] is not agitated. Thought content is delusional. Thought content is not paranoid. Cognition and memory are normal. Jackson [name] expresses impulsivity.    Review of Systems  Constitutional: Negative.   HENT: Negative.   Eyes: Negative.   Respiratory: Negative.   Cardiovascular: Negative.   Gastrointestinal: Negative.   Musculoskeletal: Negative.   Skin: Negative.   Neurological: Negative.   Psychiatric/Behavioral: Negative for depression, hallucinations, memory loss, substance abuse and suicidal ideas. The patient is not nervous/anxious and does not have insomnia.     Blood pressure 94/67, pulse 71, temperature 97.8 F (36.6 C), temperature source Oral, resp. rate 18, height 5\' 11"  (1.803 m), weight 88.5 kg, SpO2 100 %.Body mass index is  27.2 kg/m.  General Appearance: Casual  Eye Contact:  Good  Speech:  Clear and Coherent  Volume:  Normal  Mood:  Euthymic  Affect:  Appropriate  Thought Process:  Goal Directed  Orientation:  Full (Time, Place, and Person)  Thought Content:  Logical  Suicidal Thoughts:  No  Homicidal Thoughts:  No  Memory:  Immediate;   Fair Recent;   Fair Remote;   Fair  Judgement:  Fair  Insight:  Fair  Psychomotor Activity:  Normal  Concentration:  Concentration: Fair  Recall:  Jackson [name] of Knowledge:  Fair  Language:  Fair  Akathisia:  No  Handed:  Right  AIMS (if indicated):     Assets:  Desire for Improvement Physical Health Resilience Social Support  ADL's:  Impaired  Cognition:  WNL  Sleep:  Number of Hours: 4.45     Treatment Plan Summary: Medication management   Pleasant gentleman who currently is behaving fine.  Ongoing psychosis.  Not sure how much different that would be from ultimate baseline although his mother insists that at full baseline Jackson [name] has no symptoms at all.  Jackson [name] has been fairly stable on the unit, waiting for safe disposition.   # Schizoaffective disorder -- continue Abilify  15mg  daily.  -- continue Zyprexa 10mg  BID.  -- continue Depakote DR 750mg  BID (reduced),  VPA was 117 on 11/16/18, plan to recheck VPA next week.  -- continue provide zyprexa IM prn.   # Disposition -- Referral sent to [facility]  -- currently no group home available and pt has a legal guardian.    [employer] Null, MD 11/21/2018, 11:27 AM

## 2018-11-21 NOTE — Plan of Care (Signed)
Patient is alert and oriented X 3, denies SI, HI and AVH. Patient continues to have magical thinking, believes he can deactivate medication by placing a spell over them. Patient is pleasant, forwards very little, isolates most of the time to his room. Patient denies pain 0/10. Patient denies any coughing,sore throat or body aches. Vitals are within normal limits. Safety checks to continue Q 15 minutes. Problem: Education: Goal: Knowledge of [facility] Education information/materials will improve Outcome: Progressing   Problem: Coping: Goal: Ability to verbalize frustrations and anger appropriately will improve Outcome: Progressing Goal: Ability to demonstrate self-control will improve Outcome: Progressing   Problem: Safety: Goal: Periods of time without injury will increase Outcome: Progressing

## 2018-11-21 NOTE — Progress Notes (Signed)
D: Pt. During assessments denies si/hi, able to contract for safety. Pt. Continues to, "deactivate" his medications when taking them. Pt. Continues to talk about delusions of owning businesses and other grandiosities. Pt. Continues to talk about creating and seeing holograms. Pt. Besides mentioned above, endorses a normal mood.   A: Q x 15 minute observation checks were completed for safety. Patient was provided with education, but needs reinforcement. Patient was given/offered medications per orders. Patient  was encourage to attend groups, participate in unit activities and continue with plan of care. Pt. Chart and plans of care reviewed. Pt. Given support and encouragement.   R: Patient is complaint with medication and unit procedures, but still reluctant to take his medicines. Pt. covid screening complete. Pt. Attends snack time, eating good. Pt. Is still child-like and attention seeking in his behaviors at times, but mostly appropriate.              Precautionary checks every 15 minutes for safety maintained, room free of safety hazards, patient sustains no injury or falls during this shift. Will endorse care to next shift.

## 2018-11-22 NOTE — Progress Notes (Signed)
[facility] MD Progress Note  11/22/2018 2:07 PM Jackson [name]  MRN:  [id number] Subjective: Follow-up for this patient with schizoaffective disorder.  No new complaints today.  Still will acknowledge hallucinations but does not seem to be responding to internal stimuli in an obvious or problematic way.  Still a little delusional if you get into conversations with him but on the surface he is lucid and able to take care of himself.  No threats of aggression or suicide to me although he still can get irritable on the phone with his mother.  No change to medicine over the weekend. Principal Problem: Schizoaffective disorder, bipolar type (HCC) Diagnosis: Principal Problem:   Schizoaffective disorder, bipolar type (HCC) Active Problems:   Noncompliance  Total Time spent with patient: 15 minutes  Past Psychiatric History: Past history of bipolar or schizoaffective disorder  Past Medical History:  Past Medical History:  Diagnosis Date  . Bipolar 1 disorder (HCC)   . Schizoaffective disorder (HCC)    History reviewed. No pertinent surgical history. Family History: History reviewed. No pertinent family history. Family Psychiatric  History: See previous Social History:  Social History   Substance and Sexual Activity  Alcohol Use Not on file     Social History   Substance and Sexual Activity  Drug Use Not on file    Social History   Socioeconomic History  . Marital status: Single    Spouse name: Not on file  . Number of children: Not on file  . Years of education: Not on file  . Highest education level: Not on file  Occupational History  . Not on file  Social Needs  . Financial resource strain: Not on file  . Food insecurity:    Worry: Not on file    Inability: Not on file  . Transportation needs:    Medical: Not on file    Non-medical: Not on file  Tobacco Use  . Smoking status: Light Tobacco Smoker    Packs/day: 0.25    Types: Cigarettes  . Smokeless tobacco: Never Used   Substance and Sexual Activity  . Alcohol use: Not on file  . Drug use: Not on file  . Sexual activity: Not on file  Lifestyle  . Physical activity:    Days per week: Not on file    Minutes per session: Not on file  . Stress: Not on file  Relationships  . Social connections:    Talks on phone: Not on file    Gets together: Not on file    Attends religious service: Not on file    Active member of club or organization: Not on file    Attends meetings of clubs or organizations: Not on file    Relationship status: Not on file  Other Topics Concern  . Not on file  Social History Narrative  . Not on file   Additional Social History:                         Sleep: Fair  Appetite:  Negative  Current Medications: Current Facility-Administered Medications  Medication Dose Route Frequency Provider Last Rate Last Dose  . acetaminophen (TYLENOL) tablet 650 mg  650 mg Oral Q6H PRN [name], MD   650 mg at 10/26/18 2302  . alum & mag hydroxide-simeth (MAALOX/MYLANTA) 200-200-20 MG/5ML suspension 30 mL  30 mL Oral Q4H PRN [name], MD   30 mL at 10/26/18 2129  . ARIPiprazole (ABILIFY) tablet  15 mg  15 mg Oral Daily Cayetano Mikita, [name], MD   15 mg at 11/22/18 1610  . divalproex (DEPAKOTE) DR tablet 750 mg  750 mg Oral Q12H Areal Cochrane T, MD   750 mg at 11/22/18 9604  . hydrOXYzine (ATARAX/VISTARIL) tablet 50 mg  50 mg Oral Q4H PRN [name], MD   50 mg at 10/28/18 5409  . magnesium hydroxide (MILK OF MAGNESIA) suspension 30 mL  30 mL Oral Daily PRN [name], MD      . OLANZapine [facility]) injection 10 mg  10 mg Intramuscular Q6H PRN Khaleesi Gruel T, MD      . OLANZapine (ZYPREXA) tablet 20 mg  20 mg Oral Q12H Purva Vessell, [name], MD   20 mg at 11/22/18 8119  . traZODone (DESYREL) tablet 100 mg  100 mg Oral QHS PRN [name], MD   100 mg at 11/12/18 2149    Lab Results: No results found for this or any previous visit (from the past 48  hour(s)).  Blood Alcohol level:  Lab Results  Component Value Date   ETH <10 10/16/2018    Metabolic Disorder Labs: Lab Results  Component Value Date   HGBA1C 5.2 10/23/2018   MPG 102.54 10/23/2018   No results found for: PROLACTIN Lab Results  Component Value Date   CHOL 129 10/23/2018   TRIG 58 10/23/2018   HDL 50 10/23/2018   CHOLHDL 2.6 10/23/2018   VLDL 12 10/23/2018   LDLCALC 67 10/23/2018    Physical Findings: AIMS: Facial and Oral Movements Muscles of Facial Expression: None, normal Lips and Perioral Area: None, normal Jaw: None, normal Tongue: None, normal,Extremity Movements Upper (arms, wrists, hands, fingers): None, normal Lower (legs, knees, ankles, toes): None, normal, Trunk Movements Neck, shoulders, hips: None, normal, Overall Severity Severity of abnormal movements (highest score from questions above): None, normal Incapacitation due to abnormal movements: None, normal Patient's awareness of abnormal movements (rate only patient's report): No Awareness, Dental Status Current problems with teeth and/or dentures?: No Does patient usually wear dentures?: No  CIWA:  CIWA-Ar Total: 0 COWS:  COWS Total Score: 0  Musculoskeletal: Strength & Muscle Tone: within normal limits Gait & Station: normal Patient leans: N/A  Psychiatric Specialty Exam: Physical Exam  Nursing note and vitals reviewed. Constitutional: He appears well-developed and well-nourished.  HENT:  Head: Normocephalic and atraumatic.  Eyes: Pupils are equal, round, and reactive to light. Conjunctivae are normal.  Neck: Normal range of motion.  Cardiovascular: Regular rhythm and normal heart sounds.  Respiratory: Effort normal.  GI: Soft.  Musculoskeletal: Normal range of motion.  Neurological: He is alert.  Skin: Skin is warm and dry.  Psychiatric: He has a normal mood and affect. His speech is normal and behavior is normal. Thought content is paranoid and delusional. Cognition and  memory are normal. He expresses inappropriate judgment.    Review of Systems  Constitutional: Negative.   HENT: Negative.   Eyes: Negative.   Respiratory: Negative.   Cardiovascular: Negative.   Gastrointestinal: Negative.   Musculoskeletal: Negative.   Skin: Negative.   Neurological: Negative.   Psychiatric/Behavioral: Positive for hallucinations. Negative for depression, memory loss, substance abuse and suicidal ideas. The patient is not nervous/anxious and does not have insomnia.     Blood pressure 103/68, pulse 67, temperature 98.3 F (36.8 C), temperature source Oral, resp. rate 18, height  (1.803 m), weight 88.5 kg, SpO2 100 %.Body mass index is 27.2 kg/m.  General Appearance: Casual  Eye Contact:  Good  Speech:  Clear and Coherent  Volume:  Normal  Mood:  Euthymic  Affect:  Constricted  Thought Process:  Goal Directed  Orientation:  Full (Time, Place, and Person)  Thought Content:  Logical  Suicidal Thoughts:  No  Homicidal Thoughts:  No  Memory:  Immediate;   Fair Recent;   Fair Remote;   Fair  Judgement:  Fair  Insight:  Fair  Psychomotor Activity:  Normal  Concentration:  Concentration: Fair  Recall:  [employer]Fair  Fund of Knowledge:  Fair  Language:  Fair  Akathisia:  No  Handed:  Right  AIMS (if indicated):     Assets:  Desire for Improvement Physical Health  ADL's:  Impaired  Cognition:  Impaired,  Mild  Sleep:  Number of Hours: 7.15     Treatment Plan Summary: Daily contact with patient to assess and evaluate symptoms and progress in treatment, Medication management and Plan Patient continues with psychotic symptoms but is not aggressive violent dangerous or threatening on the ward.  Guardian continues to feel strongly that due to his psychotic symptoms he needs longer term inpatient hospitalization and we are awaiting referral results [facility].  No indication to change medicine today.  Physically stable.  Supportive counseling and  psychoeducation done.  Jackson RasmussenJohn Luciann Gossett, MD 11/22/2018, 2:07 PM

## 2018-11-22 NOTE — [facility] Group Notes (Signed)
[facility] Group Notes:  (Nursing/MHT/Case Management/Adjunct)  Date:  11/22/2018  Time:  8:34 PM  Type of Therapy:  Group Therapy  Participation Level:  Minimal  Participation Quality:  Sharing  Affect:  Resistant  Cognitive:  Oriented  Insight:  Good  Engagement in Group:  Distracting  Modes of Intervention:  Discussion  Summary of Progress/Problems:  Jackson [name] 11/22/2018, 8:34 PM

## 2018-11-22 NOTE — Progress Notes (Signed)
Recreation Therapy Notes  Date: 11/22/2018  Time: 9:30 am   Location: Craft room   Behavioral response: N/A   Intervention Topic: Goals  Discussion/Intervention: Patient did not attend group.   Clinical Observations/Feedback:  Patient did not attend group.   Caytlyn Evers LRT/CTRS         Cordaryl Decelles 11/22/2018 10:56 AM

## 2018-11-22 NOTE — Progress Notes (Signed)
Patient minimally participated in group activities before stepping out of group, appear in good mood and affect is appropriate to circumstances , patient is compliant with his PM medications and maintaining safety in the unit. Verbally contract for safety of self and others, patient denies any suicidal,homicidal thoughts or ideations  And  denies depressions and anxiety at this time, patient spends most of his time in his room or on the phone , patient expresses no physical complaint and no issues with being intrusive with other residents. Patient is monitored every 15 minutes for safety no distress.Marland Kitchen

## 2018-11-22 NOTE — [facility] Counselor (Signed)
CSW received confirmation that the patient remained on the waitlist for [facility].   Penni Homans, MSW, LCSW 11/22/2018 8:46 AM

## 2018-11-22 NOTE — [facility] Group Notes (Signed)
[facility] Group Notes:  (Nursing/MHT/Case Management/Adjunct)  Date:  11/22/2018  Time:  5:31 PM  Type of Therapy:  Psychoeducational Skills  Participation Level:  Did Not Attend   Foy Guadalajara 11/22/2018, 5:31 PM

## 2018-11-22 NOTE — Plan of Care (Signed)
Patient spend most of his time in his room engaging in his own activities.Patient asked for an empty bottle to do his equations.Patient is rubbing each pill on his hand before he takes it to neutralizing the pill.Compliant with medications.No aggressive behaviors noted.Appetite and energy level good.Support and encouragement given.

## 2018-11-22 NOTE — [facility] Group Notes (Signed)
LCSW Group Therapy Note   11/22/2018 12:42 PM   Type of Therapy and Topic:  Group Therapy:  Overcoming Obstacles   Participation Level:  Did Not Attend   Description of Group:    In this group patients will be encouraged to explore what they see as obstacles to their own wellness and recovery. They will be guided to discuss their thoughts, feelings, and behaviors related to these obstacles. The group will process together ways to cope with barriers, with attention given to specific choices patients can make. Each patient will be challenged to identify changes they are motivated to make in order to overcome their obstacles. This group will be process-oriented, with patients participating in exploration of their own experiences as well as giving and receiving support and challenge from other group members.   Therapeutic Goals: 1. Patient will identify personal and current obstacles as they relate to admission. 2. Patient will identify barriers that currently interfere with their wellness or overcoming obstacles.  3. Patient will identify feelings, thought process and behaviors related to these barriers. 4. Patient will identify two changes they are willing to make to overcome these obstacles:      Summary of Patient Progress x     Therapeutic Modalities:   Cognitive Behavioral Therapy Solution Focused Therapy Motivational Interviewing Relapse Prevention Therapy  Christiane Sistare, MSW, LCSW Clinical Social Work 11/22/2018 12:42 PM   

## 2018-11-23 NOTE — Progress Notes (Signed)
[facility]  11/23/2018 2:05 PM Jackson [name]  MRN:  [id number] Subjective: Follow-up for this patient with schizoaffective disorder.  Patient has no new complaints today.  Says he is doing okay.  If you stop and engage in conversation with him for long his delusions and odd thinking will come out but he is not threatening aggressive or violent.  Denies suicidal thoughts.  Tolerating medicine apparently without difficulty.  Vital stable.  Patient is still under IVC and has no other place to stay and has a guardian who is insisting that he should go to long-term hospitalization which she is currently not available. Principal Problem: Schizoaffective disorder, bipolar type (HCC) Diagnosis: Principal Problem:   Schizoaffective disorder, bipolar type (HCC) Active Problems:   Noncompliance  Total Time spent with patient: 30 minutes  Past Psychiatric History: Patient has a long history of schizoaffective disorder many prior hospitalizations  Past Medical History:  Past Medical History:  Diagnosis Date  . Bipolar 1 disorder (HCC)   . Schizoaffective disorder (HCC)    History reviewed. No pertinent surgical history. Family History: History reviewed. No pertinent family history. Family Psychiatric  History: See previous Social History:  Social History   Substance and Sexual Activity  Alcohol Use Not on file     Social History   Substance and Sexual Activity  Drug Use Not on file    Social History   Socioeconomic History  . Marital status: Single    Spouse name: Not on file  . Number of children: Not on file  . Years of education: Not on file  . Highest education level: Not on file  Occupational History  . Not on file  Social Needs  . Financial resource strain: Not on file  . Food insecurity:    Worry: Not on file    Inability: Not on file  . Transportation needs:    Medical: Not on file    Non-medical: Not on file  Tobacco Use  . Smoking status: Light Tobacco  Smoker    Packs/day: 0.25    Types: Cigarettes  . Smokeless tobacco: Never Used  Substance and Sexual Activity  . Alcohol use: Not on file  . Drug use: Not on file  . Sexual activity: Not on file  Lifestyle  . Physical activity:    Days per week: Not on file    Minutes per session: Not on file  . Stress: Not on file  Relationships  . Social connections:    Talks on phone: Not on file    Gets together: Not on file    Attends religious service: Not on file    Active member of club or organization: Not on file    Attends meetings of clubs or organizations: Not on file    Relationship status: Not on file  Other Topics Concern  . Not on file  Social History Narrative  . Not on file   Additional Social History:                         Sleep: Fair  Appetite:  Fair  Current Medications: Current Facility-Administered Medications  Medication Dose Route Frequency Provider Last Rate Last Dose  . acetaminophen (TYLENOL) tablet 650 mg  650 mg Oral Q6H PRN Terance HartStephens, [name], MD   650 mg at 10/26/18 2302  . alum & mag hydroxide-simeth (MAALOX/MYLANTA) 200-200-20 MG/5ML suspension 30 mL  30 mL Oral Q4H PRN Terance HartStephens, [name], MD  30 mL at 10/26/18 2129  . ARIPiprazole (ABILIFY) tablet 15 mg  15 mg Oral Daily Daleyza Gadomski, [name], MD   15 mg at 11/23/18 0756  . divalproex (DEPAKOTE) DR tablet 750 mg  750 mg Oral Q12H Haytham Maher T, MD   750 mg at 11/23/18 0756  . hydrOXYzine (ATARAX/VISTARIL) tablet 50 mg  50 mg Oral Q4H PRN [name], MD   50 mg at 10/28/18 9485  . magnesium hydroxide (MILK OF MAGNESIA) suspension 30 mL  30 mL Oral Daily PRN [name], MD      . OLANZapine [facility]) injection 10 mg  10 mg Intramuscular Q6H PRN Franz Svec T, MD      . OLANZapine (ZYPREXA) tablet 20 mg  20 mg Oral Q12H Suann Klier, [name], MD   20 mg at 11/23/18 0755  . traZODone (DESYREL) tablet 100 mg  100 mg Oral QHS PRN [name], MD   100 mg at 11/12/18 2149     Lab Results: No results found for this or any previous visit (from the past 48 hour(s)).  Blood Alcohol level:  Lab Results  Component Value Date   ETH <10 10/16/2018    Metabolic Disorder Labs: Lab Results  Component Value Date   HGBA1C 5.2 10/23/2018   MPG 102.54 10/23/2018   No results found for: PROLACTIN Lab Results  Component Value Date   CHOL 129 10/23/2018   TRIG 58 10/23/2018   HDL 50 10/23/2018   CHOLHDL 2.6 10/23/2018   VLDL 12 10/23/2018   LDLCALC 67 10/23/2018    Physical Findings: AIMS: Facial and Oral Movements Muscles of Facial Expression: None, normal Lips and Perioral Area: None, normal Jaw: None, normal Tongue: None, normal,Extremity Movements Upper (arms, wrists, hands, fingers): None, normal Lower (legs, knees, ankles, toes): None, normal, Trunk Movements Neck, shoulders, hips: None, normal, Overall Severity Severity of abnormal movements (highest score from questions above): None, normal Incapacitation due to abnormal movements: None, normal Patient's awareness of abnormal movements (rate only patient's report): No Awareness, Dental Status Current problems with teeth and/or dentures?: No Does patient usually wear dentures?: No  CIWA:  CIWA-Ar Total: 0 COWS:  COWS Total Score: 0  Musculoskeletal: Strength & Muscle Tone: within normal limits Gait & Station: normal Patient leans: N/A  Psychiatric Specialty Exam: Physical Exam  Nursing note and vitals reviewed. Constitutional: He appears well-developed and well-nourished.  HENT:  Head: Normocephalic and atraumatic.  Eyes: Pupils are equal, round, and reactive to light. Conjunctivae are normal.  Neck: Normal range of motion.  Cardiovascular: Regular rhythm and normal heart sounds.  Respiratory: Effort normal. No respiratory distress.  GI: Soft.  Musculoskeletal: Normal range of motion.  Neurological: He is alert.  Skin: Skin is warm and dry.  Psychiatric: Judgment normal. His affect  is blunt. His speech is not delayed. He is slowed. Thought content is delusional. Cognition and memory are normal.    Review of Systems  Constitutional: Negative.   HENT: Negative.   Eyes: Negative.   Respiratory: Negative.   Cardiovascular: Negative.   Gastrointestinal: Negative.   Musculoskeletal: Negative.   Skin: Negative.   Neurological: Negative.   Psychiatric/Behavioral: Positive for hallucinations.    Blood pressure 104/70, pulse 68, temperature 97.9 F (36.6 C), temperature source Oral, resp. rate 17, height 5\' 11"  (1.803 m), weight 88.5 kg, SpO2 100 %.Body mass index is 27.2 kg/m.  General Appearance: Casual  Eye Contact:  Good  Speech:  Clear and Coherent  Volume:  Normal  Mood:  Euthymic  Affect:  Congruent  Thought Process:  Goal Directed  Orientation:  Full (Time, Place, and Person)  Thought Content:  Logical  Suicidal Thoughts:  No  Homicidal Thoughts:  No  Memory:  Immediate;   Fair Recent;   Fair Remote;   Fair  Judgement:  Fair  Insight:  Fair  Psychomotor Activity:  Decreased  Concentration:  Concentration: Fair  Recall:  [employer] of Knowledge:  Fair  Language:  Fair  Akathisia:  No  Handed:  Right  AIMS (if indicated):     Assets:  Desire for Improvement Physical Health  ADL's:  Intact  Cognition:  WNL  Sleep:  Number of Hours: 6     Treatment Plan Summary: Daily contact with patient to assess and evaluate symptoms and progress in treatment, Medication management and Plan Patient is essentially stable although still having some lingering disorganized thinking.  Some of it seems to be largely a matter of finding ways to keep himself entertained.  In any case he seems fairly stable and not aggressive but we have no placement for him.  Guardian wants him to go to long-term hospitalization.  Application still pending.  No change to medicine for today.  Jackson Rasmussen, MD 11/23/2018, 2:05 PM

## 2018-11-23 NOTE — Progress Notes (Signed)
Pastoral Care Visit   11/23/18 1500  Clinical Encounter Type  Visited With Patient  Visit Type Follow-up;Psychological support;Behavioral Health  Referral From Chaplain;Patient  Consult/Referral To Chaplain  Spiritual Encounters  Spiritual Needs Grief support  Stress Factors  Patient Stress Factors Loss of control   Pt invited chap into his room. After several attempts to demonstrate supernatural ability, chap was able to redirect pt to talk about reality.  Pt shared about his frustration w/ mom who he says wants him hospitalized long term.  He does not believe he has any psychosis.  Pt wishes to have a judge help him become emancipated from his mother.  Pt shared that he mostly just stays in his room, is reading the Bible and writing down Bible verses from the book of proverbs.  He strongly desires to leave and is confused about why he must be institutionalized.  Milinda Antis, [address]

## 2018-11-23 NOTE — Progress Notes (Signed)
D: Patient  Noted cheerful affect  Limited interaction  With his peers and staff. Isolates to room  Appetite good at meal time   Compliant with medication , aware of the function of medications  Continue  With delusions and magical thinking . Noted to press down on each medication  To deactivate the function of the medication  .  Patient denies any depression, hopelessness or anxiety . Denies suicidally .   A: thought process remain altered with magical thinking . Compliant with unit process  R: staff will continue to monitor with 15 minute checks

## 2018-11-23 NOTE — [facility] Group Notes (Signed)
LCSW Group Therapy Note  11/23/2018 1:00 PM  Type of Therapy/Topic:  Group Therapy:  Feelings about Diagnosis  Participation Level:  Did Not Attend   Description of Group:   This group will allow patients to explore their thoughts and feelings about diagnoses they have received. Patients will be guided to explore their level of understanding and acceptance of these diagnoses. Facilitator will encourage patients to process their thoughts and feelings about the reactions of others to their diagnosis and will guide patients in identifying ways to discuss their diagnosis with significant others in their lives. This group will be process-oriented, with patients participating in exploration of their own experiences, giving and receiving support, and processing challenge from other group members.   Therapeutic Goals: 1. Patient will demonstrate understanding of diagnosis as evidenced by identifying two or more symptoms of the disorder 2. Patient will be able to express two feelings regarding the diagnosis 3. Patient will demonstrate their ability to communicate their needs through discussion and/or role play  Summary of Patient Progress: X  Therapeutic Modalities:   Cognitive Behavioral Therapy Brief Therapy Feelings Identification   Penni Homans, MSW, LCSW 11/23/2018 2:26 PM

## 2018-11-23 NOTE — Progress Notes (Signed)
Recreation Therapy Notes  Date: 11/23/2018  Time: 9:30 am   Location: Craft room   Behavioral response: N/A   Intervention Topic: Decision Making  Discussion/Intervention: Patient did not attend group.   Clinical Observations/Feedback:  Patient did not attend group.   Jozalynn Noyce LRT/CTRS        Shaday Rayborn 11/23/2018 10:30 AM

## 2018-11-23 NOTE — Plan of Care (Signed)
Patient aware of  information  given relating to [facility] education , patient  able to verbalize  understanding .  No anger outburst for past 24 hours . Able to maintained  self control . Voice of no safety concerns. Thought process improving. Compliant  with medication. Patient able to verbalize positive feeling of self .  Able to attend unit programing  that help reduce anxiety . Patient remains delusional , with magical thinking .   Problem: Education: Goal: Knowledge of [facility] Education information/materials will improve Outcome: Progressing   Problem: Coping: Goal: Ability to verbalize frustrations and anger appropriately will improve Outcome: Progressing Goal: Ability to demonstrate self-control will improve Outcome: Progressing   Problem: Safety: Goal: Periods of time without injury will increase Outcome: Progressing   Problem: Education: Goal: Will be free of psychotic symptoms Outcome: Progressing Goal: Knowledge of the prescribed therapeutic regimen will improve Outcome: Progressing   Problem: Self-Concept: Goal: Will verbalize positive feelings about self Outcome: Progressing   Problem: Education: Goal: Ability to state activities that reduce stress will improve Outcome: Progressing

## 2018-11-23 NOTE — [facility] Group Notes (Signed)
[facility] Group Notes:  (Nursing/MHT/Case Management/Adjunct)  Date:  11/23/2018  Time:  11:23 PM  Type of Therapy:  Group Therapy  Participation Level:  Did Not Attend   [name] 11/23/2018, 11:23 PM

## 2018-11-24 NOTE — Plan of Care (Signed)
Patient is alert and oriented X 3, Denies SI, HI and AVH. Patient continues to have some magical thinking, focused on finding names of [place] judges to apply for his own guardianship. Patient does take medications, and states he is sleeping well, rates pain 0/10. Patient is cooperative, minimal attention to nursing staff today. RN will continue to assess. Safety checks Q 15 minutes. Problem: Education: Goal: Knowledge of [place] [facility] Education information/materials will improve Outcome: Progressing   Problem: Coping: Goal: Ability to verbalize frustrations and anger appropriately will improve Outcome: Progressing Goal: Ability to demonstrate self-control will improve Outcome: Progressing   Problem: Safety: Goal: Periods of time without injury will increase Outcome: Progressing   Problem: Education: Goal: Will be free of psychotic symptoms Outcome: Progressing Goal: Knowledge of the prescribed therapeutic regimen will improve Outcome: Progressing

## 2018-11-24 NOTE — Tx Team (Signed)
Interdisciplinary Treatment and Diagnostic Plan Update  11/24/2018 Time of Session: 8:30AM  Jackson [name] MRN: [id number]  Principal Diagnosis: Schizoaffective disorder, bipolar type [facility](HCC)  Secondary Diagnoses: Principal Problem:   Schizoaffective disorder, bipolar type (HCC) Active Problems:   Noncompliance   Current Medications:  Current Facility-Administered Medications  Medication Dose Route Frequency Provider Last Rate Last Dose  . acetaminophen (TYLENOL) tablet 650 mg  650 mg Oral Q6H PRN Terance HartStephens, [name], MD   650 mg at 10/26/18 2302  . alum & mag hydroxide-simeth (MAALOX/MYLANTA) 200-200-20 MG/5ML suspension 30 mL  30 mL Oral Q4H PRN Terance HartStephens, [name], MD   30 mL at 10/26/18 2129  . ARIPiprazole (ABILIFY) tablet 15 mg  15 mg Oral Daily [name], Jackquline DenmarkJohn T, MD   15 mg at 11/24/18 0801  . divalproex (DEPAKOTE) DR tablet 750 mg  750 mg Oral Q12H [name], Jackquline DenmarkJohn T, MD   750 mg at 11/24/18 0801  . hydrOXYzine (ATARAX/VISTARIL) tablet 50 mg  50 mg Oral Q4H PRN Terance HartStephens, [name], MD   50 mg at 10/28/18 [id number]  . magnesium hydroxide (MILK OF MAGNESIA) suspension 30 mL  30 mL Oral Daily PRN Terance HartStephens, [name], MD      . OLANZapine [facility](ZYPREXA) injection 10 mg  10 mg Intramuscular Q6H PRN [name], [name], MD      . OLANZapine (ZYPREXA) tablet 20 mg  20 mg Oral Q12H [name], Jackquline DenmarkJohn T, MD   20 mg at 11/24/18 0801  . traZODone (DESYREL) tablet 100 mg  100 mg Oral QHS PRN Terance HartStephens, [name], MD   100 mg at 11/12/18 2149   PTA Medications: No medications prior to admission.    Patient Stressors: Health problems Medication change or noncompliance  Patient Strengths: Manufacturing systems engineerCommunication skills Religious Affiliation Supportive family/friends  Treatment Modalities: Medication Management, Group therapy, Case management,  1 to 1 session with clinician, Psychoeducation, Recreational therapy.   Physician Treatment Plan for Primary Diagnosis: Schizoaffective disorder, bipolar type (HCC) Long Term  Goal(s): Improvement in symptoms so as ready for discharge Improvement in symptoms so as ready for discharge   Short Term Goals: Ability to demonstrate self-control will improve Ability to identify and develop effective coping behaviors will improve Compliance with prescribed medications will improve  Medication Management: Evaluate patient's response, side effects, and tolerance of medication regimen.  Therapeutic Interventions: 1 to 1 Szczygiel, Unit Group [place] and Medication administration.  Evaluation of Outcomes: Progressing  Physician Treatment Plan for Secondary Diagnosis: Principal Problem:   Schizoaffective disorder, bipolar type (HCC) Active Problems:   Noncompliance  Long Term Goal(s): Improvement in symptoms so as ready for discharge Improvement in symptoms so as ready for discharge   Short Term Goals: Ability to demonstrate self-control will improve Ability to identify and develop effective coping behaviors will improve Compliance with prescribed medications will improve     Medication Management: Evaluate patient's response, side effects, and tolerance of medication regimen.  Therapeutic Interventions: 1 to 1 Wettstein, Unit Group [place] and Medication administration.  Evaluation of Outcomes: Progressing   RN Treatment Plan for Primary Diagnosis: Schizoaffective disorder, bipolar type (HCC) Long Term Goal(s): Knowledge of disease and therapeutic regimen to maintain health will improve  Short Term Goals: Ability to verbalize frustration and anger appropriately will improve, Ability to demonstrate self-control, Ability to participate in decision making will improve and Ability to verbalize feelings will improve  Medication Management: RN will administer medications as ordered by provider, will assess and evaluate patient's response and provide education to patient for prescribed  medication. RN will report any adverse and/or side effects to prescribing  provider.  Therapeutic Interventions: 1 on 1 counseling Hopping, Psychoeducation, Medication administration, Evaluate responses to treatment, Monitor vital signs and CBGs as ordered, Perform/monitor CIWA, COWS, AIMS and Fall Risk screenings as ordered, Perform wound care treatments as ordered.  Evaluation of Outcomes: Progressing   LCSW Treatment Plan for Primary Diagnosis: Schizoaffective disorder, bipolar type (HCC) Long Term Goal(s): Safe transition to appropriate next level of care at discharge, Engage patient in therapeutic group addressing interpersonal concerns.  Short Term Goals: Engage patient in aftercare planning with referrals and resources, Increase social support, Increase ability to appropriately verbalize feelings, Increase emotional regulation and Facilitate acceptance of mental health diagnosis and concerns  Therapeutic Interventions: Assess for all discharge needs, 1 to 1 time with Social worker, Explore available resources and support systems, Assess for adequacy in community support network, Educate family and significant other(s) on suicide prevention, Complete Psychosocial Assessment, Interpersonal group therapy.  Evaluation of Outcomes: Progressing   Progress in Treatment: Attending groups: No. Participating in groups: No. Taking medication as prescribed: Yes.   Toleration medication: Yes. Family/Significant other contact made: Yes, individual(s) contacted:  CSW completed SPE with the patients mother.  Patient understands diagnosis: Yes. Discussing patient identified problems/goals with staff: Yes. Medical problems stabilized or resolved: Yes. Denies suicidal/homicidal ideation: Yes. Issues/concerns per patient self-inventory: No. Other: none  New problem(s) identified: No, Describe:  none  New Short Term/Long Term Goal(s): elimination of symptoms of psychosis, medication management for mood stabilization; elimination of SI thoughts; development of  comprehensive mental wellness plan.  Patient Goals:  "independence"  Discharge Plan or Barriers: Pt has a legal guardian, his mother.  Mother continues to decline that the patient be placed in a group home.  Pt remains on the waitlist for [facility]. No negative behaviors to report.   Reason for Continuation of Hospitalization: Aggression Delusions  Hallucinations Mania Medication stabilization  Estimated Length of Stay: TBD  Recreational Therapy: Patient Stressors: N/A Patient Goal: Patient will focus on task/topic with 2 prompts from staff within 5 recreation therapy group [place]  Attendees: Patient:  11/24/2018 2:25 PM  Physician: Dr. Toni Amend, MD 11/24/2018 2:25 PM  Nursing:  11/24/2018 2:25 PM  RN Care Manager: 11/24/2018 2:25 PM  Social Worker: Penni Homans, LCSW 11/24/2018 2:25 PM  Recreational Therapist:  11/24/2018 2:25 PM  Other:  11/24/2018 2:25 PM  Other:  11/24/2018 2:25 PM  Other: 11/24/2018 2:25 PM    Scribe for Treatment Team: Harden Mo, LCSW 11/24/2018 2:25 PM

## 2018-11-24 NOTE — Plan of Care (Signed)
  Problem: Education: Goal: Will be free of psychotic symptoms Outcome: Progressing  Patient appears less psychotic  
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
## 2018-11-24 NOTE — Progress Notes (Signed)
Patient alert and oriented x 4, affect is flat and sad, he denies SI/HI/AVH, thoughts are disorganized and incoherent at times, he was  noted witting a lot of notes and when writer approached the  Patient he stated " I am mailing this to the judge to get my guardianship back" patient expressed he is upset at his mother and he feels he is not been included in the decisions his mother makes concerning him. Patient appears less anxious, took scheduled  Medication and interacting appropriately withb peers and staff. 15 minutes safety checks maintained will continue to monitor.

## 2018-11-24 NOTE — [facility] Group Notes (Signed)
Emotional Regulation 11/24/2018 1PM  Type of Therapy/Topic:  Group Therapy:  Emotion Regulation  Participation Level:  Did Not Attend   Description of Group:   The purpose of this group is to assist patients in learning to regulate negative emotions and experience positive emotions. Patients will be guided to discuss ways in which they have been vulnerable to their negative emotions. These vulnerabilities will be juxtaposed with experiences of positive emotions or situations, and patients will be challenged to use positive emotions to combat negative ones. Special emphasis will be placed on coping with negative emotions in conflict situations, and patients will process healthy conflict resolution skills.  Therapeutic Goals: 1. Patient will identify two positive emotions or experiences to reflect on in order to balance out negative emotions 2. Patient will label two or more emotions that they find the most difficult to experience 3. Patient will demonstrate positive conflict resolution skills through discussion and/or role plays  Summary of Patient Progress:       Therapeutic Modalities:   Cognitive Behavioral Therapy Feelings Identification Dialectical Behavioral Therapy   Suzan Slick, LCSW 11/24/2018 2:03 PM

## 2018-11-24 NOTE — Progress Notes (Signed)
[facility] MD Progress Note  11/24/2018 3:55 PM Jackson [name]  MRN:  [id number] Subjective: Follow-up patient with schizoaffective disorder.  No new complaints.  Still has psychotic thinking magical thinking disorganized behavior but has not been violent. Principal Problem: Schizoaffective disorder, bipolar type (HCC) Diagnosis: Principal Problem:   Schizoaffective disorder, bipolar type (HCC) Active Problems:   Noncompliance  Total Time spent with patient: 15 minutes  Past Psychiatric History: History of bipolar disorder or schizoaffective disorder  Past Medical History:  Past Medical History:  Diagnosis Date  . Bipolar 1 disorder (HCC)   . Schizoaffective disorder (HCC)    History reviewed. No pertinent surgical history. Family History: History reviewed. No pertinent family history. Family Psychiatric  History: See previous Social History:  Social History   Substance and Sexual Activity  Alcohol Use Not on file     Social History   Substance and Sexual Activity  Drug Use Not on file    Social History   Socioeconomic History  . Marital status: Single    Spouse name: Not on file  . Number of children: Not on file  . Years of education: Not on file  . Highest education level: Not on file  Occupational History  . Not on file  Social Needs  . Financial resource strain: Not on file  . Food insecurity:    Worry: Not on file    Inability: Not on file  . Transportation needs:    Medical: Not on file    Non-medical: Not on file  Tobacco Use  . Smoking status: Light Tobacco Smoker    Packs/day: 0.25    Types: Cigarettes  . Smokeless tobacco: Never Used  Substance and Sexual Activity  . Alcohol use: Not on file  . Drug use: Not on file  . Sexual activity: Not on file  Lifestyle  . Physical activity:    Days per week: Not on file    Minutes per session: Not on file  . Stress: Not on file  Relationships  . Social connections:    Talks on phone: Not on file    Gets  together: Not on file    Attends religious service: Not on file    Active member of club or organization: Not on file    Attends meetings of clubs or organizations: Not on file    Relationship status: Not on file  Other Topics Concern  . Not on file  Social History Narrative  . Not on file   Additional Social History:                         Sleep: Fair  Appetite:  Fair  Current Medications: Current Facility-Administered Medications  Medication Dose Route Frequency Provider Last Rate Last Dose  . acetaminophen (TYLENOL) tablet 650 mg  650 mg Oral Q6H PRN [name], MD   650 mg at 10/26/18 2302  . alum & mag hydroxide-simeth (MAALOX/MYLANTA) 200-200-20 MG/5ML suspension 30 mL  30 mL Oral Q4H PRN [name], MD   30 mL at 10/26/18 2129  . ARIPiprazole (ABILIFY) tablet 15 mg  15 mg Oral Daily Alison Breeding, [name], MD   15 mg at 11/24/18 0801  . divalproex (DEPAKOTE) DR tablet 750 mg  750 mg Oral Q12H Deovion Batrez, [name], MD   750 mg at 11/24/18 0801  . hydrOXYzine (ATARAX/VISTARIL) tablet 50 mg  50 mg Oral Q4H PRN [name], MD   50 mg at 10/28/18  1096  . magnesium hydroxide (MILK OF MAGNESIA) suspension 30 mL  30 mL Oral Daily PRN [name], MD      . OLANZapine [facility]) injection 10 mg  10 mg Intramuscular Q6H PRN Armonte Tortorella T, MD      . OLANZapine (ZYPREXA) tablet 20 mg  20 mg Oral Q12H Nicolae Vasek, [name], MD   20 mg at 11/24/18 0801  . traZODone (DESYREL) tablet 100 mg  100 mg Oral QHS PRN [name], MD   100 mg at 11/12/18 2149    Lab Results: No results found for this or any previous visit (from the past 48 hour(s)).  Blood Alcohol level:  Lab Results  Component Value Date   ETH <10 10/16/2018    Metabolic Disorder Labs: Lab Results  Component Value Date   HGBA1C 5.2 10/23/2018   MPG 102.54 10/23/2018   No results found for: PROLACTIN Lab Results  Component Value Date   CHOL 129 10/23/2018   TRIG 58 10/23/2018    HDL 50 10/23/2018   CHOLHDL 2.6 10/23/2018   VLDL 12 10/23/2018   LDLCALC 67 10/23/2018    Physical Findings: AIMS: Facial and Oral Movements Muscles of Facial Expression: None, normal Lips and Perioral Area: None, normal Jaw: None, normal Tongue: None, normal,Extremity Movements Upper (arms, wrists, hands, fingers): None, normal Lower (legs, knees, ankles, toes): None, normal, Trunk Movements Neck, shoulders, hips: None, normal, Overall Severity Severity of abnormal movements (highest score from questions above): None, normal Incapacitation due to abnormal movements: None, normal Patient's awareness of abnormal movements (rate only patient's report): No Awareness, Dental Status Current problems with teeth and/or dentures?: No Does patient usually wear dentures?: No  CIWA:  CIWA-Ar Total: 0 COWS:  COWS Total Score: 0  Musculoskeletal: Strength & Muscle Tone: within normal limits Gait & Station: normal Patient leans: N/A  Psychiatric Specialty Exam: Physical Exam  Nursing note and vitals reviewed. Constitutional: He appears well-developed and well-nourished.  HENT:  Head: Normocephalic and atraumatic.  Eyes: Pupils are equal, round, and reactive to light. Conjunctivae are normal.  Neck: Normal range of motion.  Cardiovascular: Normal heart sounds.  Respiratory: Effort normal.  GI: Soft.  Musculoskeletal: Normal range of motion.  Neurological: He is alert.  Skin: Skin is warm and dry.  Psychiatric: His affect is blunt. His speech is delayed. He is slowed. Thought content is delusional.    Review of Systems  Constitutional: Negative.   HENT: Negative.   Eyes: Negative.   Respiratory: Negative.   Cardiovascular: Negative.   Gastrointestinal: Negative.   Musculoskeletal: Negative.   Skin: Negative.   Neurological: Negative.   Psychiatric/Behavioral: Negative.     Blood pressure 108/72, pulse 70, temperature 98.3 F (36.8 C), temperature source Oral, resp. rate 18,  height  (1.803 m), weight 88.5 kg, SpO2 100 %.Body mass index is 27.2 kg/m.  General Appearance: Casual  Eye Contact:  Fair  Speech:  Clear and Coherent  Volume:  Normal  Mood:  Euthymic  Affect:  Congruent  Thought Process:  Goal Directed  Orientation:  Full (Time, Place, and Person)  Thought Content:  Logical  Suicidal Thoughts:  No  Homicidal Thoughts:  No  Memory:  Immediate;   Fair Recent;   Fair Remote;   Fair  Judgement:  Fair  Insight:  Fair  Psychomotor Activity:  Normal  Concentration:  Concentration: Fair  Recall:  [employer] of Knowledge:  Fair  Language:  Fair  Akathisia:  No  Handed:  Right  AIMS (if indicated):     Assets:  Desire for Improvement Housing  ADL's:  Intact  Cognition:  WNL  Sleep:  Number of Hours: 4.5     Treatment Plan Summary: Daily contact with patient to assess and evaluate symptoms and progress in treatment, Medication management and Plan No change to current medication management.  [facility]  Jackson RasmussenJohn Younis Mathey, MD 11/24/2018, 3:55 PM

## 2018-11-24 NOTE — Progress Notes (Signed)
Recreation Therapy Notes    Date: 11/24/2018  Time: 9:30 am   Location: Craft room   Behavioral response: N/A   Intervention Topic: Team Work  Discussion/Intervention: Patient did not attend group.   Clinical Observations/Feedback:  Patient did not attend group.   Jackson [name] LRT/CTRS        Jackson [name] 11/24/2018 11:19 AM 

## 2018-11-25 MED ORDER — DIVALPROEX SODIUM 500 MG PO DR TAB
500.0000 mg | DELAYED_RELEASE_TABLET | Freq: Two times a day (BID) | ORAL | Status: DC
  Administered 2018-11-25 – 2018-11-29 (×8): 500 mg via ORAL
  Filled 2018-11-25 (×8): qty 1

## 2018-11-25 NOTE — Progress Notes (Signed)
Recreation Therapy Notes  Date: 11/25/2018  Time: 9:30 am   Location: Craft room   Behavioral response: N/A   Intervention Topic: Wellness  Discussion/Intervention: Patient did not attend group.   Clinical Observations/Feedback:  Patient did not attend group.   Jerra Huckeby LRT/CTRS         Milina Pagett 11/25/2018 10:27 AM

## 2018-11-25 NOTE — Progress Notes (Signed)
Patient alert and oriented x 4, denies SI/HI/AVH no disruptive behavior during this shift, affect is blunted, he is interacting with staff and peers appropriately.  Patient's thoughts are organized and coherent no bizarre behavior and he is receptive to staff. Patient was complaint with medication. 15 minutes safety checks maintained will continue to monitor.  

## 2018-11-25 NOTE — [facility] Group Notes (Signed)
LCSW Group Therapy Note  11/25/2018 12:22 PM  Type of Therapy/Topic:  Group Therapy:  Balance in Life  Participation Level:  Did Not Attend  Description of Group:    This group will address the concept of balance and how it feels and looks when one is unbalanced. Patients will be encouraged to process areas in their lives that are out of balance and identify reasons for remaining unbalanced. Facilitators will guide patients in utilizing problem-solving interventions to address and correct the stressor making their life unbalanced. Understanding and applying boundaries will be explored and addressed for obtaining and maintaining a balanced life. Patients will be encouraged to explore ways to assertively make their unbalanced needs known to significant others in their lives, using other group members and facilitator for support and feedback.  Therapeutic Goals: 1. Patient will identify two or more emotions or situations they have that consume much of in their lives. 2. Patient will identify signs/triggers that life has become out of balance:  3. Patient will identify two ways to set boundaries in order to achieve balance in their lives:  4. Patient will demonstrate ability to communicate their needs through discussion and/or role plays  Summary of Patient Progress: x    Therapeutic Modalities:   Cognitive Behavioral Therapy Solution-Focused Therapy Assertiveness Training  Iris Pert, MSW, LCSW Clinical Social Work 11/25/2018 12:22 PM

## 2018-11-25 NOTE — Plan of Care (Signed)
Able to attend unit programing  that help reduce anxiety Patient aware of  information  given relating to [facility] education , patient  able to verbalize  understanding .  No anger outburst for past 24 hours . Able to maintained  self control . Voice of no safety concerns. Thought process improving. Compliant  with medication. Patient able to verbalize positive feeling of self .  Marland Kitchen Patient remains delusional , with magical thinking .   Problem: Education: Goal: Knowledge of [place] [facility] Education information/materials will improve Outcome: Progressing   Problem: Coping: Goal: Ability to verbalize frustrations and anger appropriately will improve Outcome: Progressing Goal: Ability to demonstrate self-control will improve Outcome: Progressing   Problem: Safety: Goal: Periods of time without injury will increase Outcome: Progressing   Problem: Education: Goal: Will be free of psychotic symptoms Outcome: Progressing Goal: Knowledge of the prescribed therapeutic regimen will improve Outcome: Progressing   Problem: Self-Concept: Goal: Will verbalize positive feelings about self Outcome: Progressing   Problem: Education: Goal: Ability to state activities that reduce stress will improve Outcome: Progressing

## 2018-11-25 NOTE — Progress Notes (Signed)
[facility]  11/25/2018 3:53 PM Jackson [name]  MRN:  [id number] Subjective: Follow-up for this patient with schizoaffective disorder.  No new complaints.  No changes to presentation.  Still has psychotic symptoms but has not been aggressive or violent.  Feels tired much of the time. Principal Problem: Schizoaffective disorder, bipolar type (HCC) Diagnosis: Principal Problem:   Schizoaffective disorder, bipolar type (HCC) Active Problems:   Noncompliance  Total Time spent with patient: 30 minutes  Past Psychiatric History: Patient has a history of schizoaffective disorder with several hospitalizations.  Past Medical History:  Past Medical History:  Diagnosis Date  . Bipolar 1 disorder (HCC)   . Schizoaffective disorder (HCC)    History reviewed. No pertinent surgical history. Family History: History reviewed. No pertinent family history. Family Psychiatric  History: History not available. Social History:  Social History   Substance and Sexual Activity  Alcohol Use Not on file     Social History   Substance and Sexual Activity  Drug Use Not on file    Social History   Socioeconomic History  . Marital status: Single    Spouse name: Not on file  . Number of children: Not on file  . Years of education: Not on file  . Highest education level: Not on file  Occupational History  . Not on file  Social Needs  . Financial resource strain: Not on file  . Food insecurity:    Worry: Not on file    Inability: Not on file  . Transportation needs:    Medical: Not on file    Non-medical: Not on file  Tobacco Use  . Smoking status: Light Tobacco Smoker    Packs/day: 0.25    Types: Cigarettes  . Smokeless tobacco: Never Used  Substance and Sexual Activity  . Alcohol use: Not on file  . Drug use: Not on file  . Sexual activity: Not on file  Lifestyle  . Physical activity:    Days per week: Not on file    Minutes per session: Not on file  . Stress: Not on file   Relationships  . Social connections:    Talks on phone: Not on file    Gets together: Not on file    Attends religious service: Not on file    Active member of club or organization: Not on file    Attends meetings of clubs or organizations: Not on file    Relationship status: Not on file  Other Topics Concern  . Not on file  Social History Narrative  . Not on file   Additional Social History:                         Sleep: Fair  Appetite:  Fair  Current Medications: Current Facility-Administered Medications  Medication Dose Route Frequency Provider Last Rate Last Dose  . acetaminophen (TYLENOL) tablet 650 mg  650 mg Oral Q6H PRN Terance HartStephens, [name], MD   650 mg at 10/26/18 2302  . alum & mag hydroxide-simeth (MAALOX/MYLANTA) 200-200-20 MG/5ML suspension 30 mL  30 mL Oral Q4H PRN Terance HartStephens, [name], MD   30 mL at 10/26/18 2129  . ARIPiprazole (ABILIFY) tablet 15 mg  15 mg Oral Daily Mackenzie Groom, Jackquline DenmarkJohn T, MD   15 mg at 11/25/18 0755  . divalproex (DEPAKOTE) DR tablet 750 mg  750 mg Oral Q12H Jonell Brumbaugh, Jackquline DenmarkJohn T, MD   750 mg at 11/25/18 0755  . hydrOXYzine (ATARAX/VISTARIL) tablet 50 mg  50 mg Oral Q4H PRN [name], MD   50 mg at 10/28/18 4765  . magnesium hydroxide (MILK OF MAGNESIA) suspension 30 mL  30 mL Oral Daily PRN [name], MD      . OLANZapine [facility]) injection 10 mg  10 mg Intramuscular Q6H PRN Adin Laker T, MD      . OLANZapine (ZYPREXA) tablet 20 mg  20 mg Oral Q12H Selen Smucker, [name], MD   20 mg at 11/25/18 0755  . traZODone (DESYREL) tablet 100 mg  100 mg Oral QHS PRN [name], MD   100 mg at 11/12/18 2149    Lab Results: No results found for this or any previous visit (from the past 48 hour(s)).  Blood Alcohol level:  Lab Results  Component Value Date   ETH <10 10/16/2018    Metabolic Disorder Labs: Lab Results  Component Value Date   HGBA1C 5.2 10/23/2018   MPG 102.54 10/23/2018   No results found for: PROLACTIN Lab  Results  Component Value Date   CHOL 129 10/23/2018   TRIG 58 10/23/2018   HDL 50 10/23/2018   CHOLHDL 2.6 10/23/2018   VLDL 12 10/23/2018   LDLCALC 67 10/23/2018    Physical Findings: AIMS: Facial and Oral Movements Muscles of Facial Expression: None, normal Lips and Perioral Area: None, normal Jaw: None, normal Tongue: None, normal,Extremity Movements Upper (arms, wrists, hands, fingers): None, normal Lower (legs, knees, ankles, toes): None, normal, Trunk Movements Neck, shoulders, hips: None, normal, Overall Severity Severity of abnormal movements (highest score from questions above): None, normal Incapacitation due to abnormal movements: None, normal Patient's awareness of abnormal movements (rate only patient's report): No Awareness, Dental Status Current problems with teeth and/or dentures?: No Does patient usually wear dentures?: No  CIWA:  CIWA-Ar Total: 0 COWS:  COWS Total Score: 0  Musculoskeletal: Strength & Muscle Tone: within normal limits Gait & Station: normal Patient leans: N/A  Psychiatric Specialty Exam: Physical Exam  Nursing note and vitals reviewed. Constitutional: He appears well-developed and well-nourished.  HENT:  Head: Normocephalic and atraumatic.  Eyes: Pupils are equal, round, and reactive to light. Conjunctivae are normal.  Neck: Normal range of motion.  Cardiovascular: Regular rhythm and normal heart sounds.  Respiratory: Effort normal. No respiratory distress.  GI: Soft.  Musculoskeletal: Normal range of motion.  Neurological: He is alert.  Skin: Skin is warm and dry.  Psychiatric: His affect is blunt. His speech is delayed. He is slowed. Thought content is paranoid and delusional. Cognition and memory are impaired. He expresses no homicidal ideation.    Review of Systems  Constitutional: Negative.   HENT: Negative.   Eyes: Negative.   Respiratory: Negative.   Cardiovascular: Negative.   Gastrointestinal: Negative.    Musculoskeletal: Negative.   Skin: Negative.   Neurological: Negative.   Psychiatric/Behavioral: Negative.     Blood pressure 105/63, pulse 71, temperature 97.9 F (36.6 C), temperature source Oral, resp. rate 18, height 5\' 11"  (1.803 m), weight 88.5 kg, SpO2 100 %.Body mass index is 27.2 kg/m.  General Appearance: Casual  Eye Contact:  Fair  Speech:  Clear and Coherent  Volume:  Normal  Mood:  Dysphoric  Affect:  Congruent  Thought Process:  Goal Directed  Orientation:  Full (Time, Place, and Person)  Thought Content:  Illogical, Ideas of Reference:   Delusions and Paranoid Ideation  Suicidal Thoughts:  No  Homicidal Thoughts:  No  Memory:  Immediate;   Fair Recent;   Fair  Remote;   Fair  Judgement:  Fair  Insight:  Fair  Psychomotor Activity:  Decreased  Concentration:  Concentration: Fair  Recall:  [employer] of Knowledge:  Fair  Language:  Fair  Akathisia:  No  Handed:  Right  AIMS (if indicated):     Assets:  Desire for Improvement Physical Health Social Support  ADL's:  Intact  Cognition:  Impaired,  Mild  Sleep:  Number of Hours: 6.5     Treatment Plan Summary: Daily contact with patient to assess and evaluate symptoms and progress in treatment, Medication management and Plan Patient still having some psychotic symptoms but he is calm and without active behavior problems.  Complains of feeling tired much of the time.  We have been waiting for referral to [facility] but there has not been any new update on that.  I may cut back on some of his medicines as he may just be a little too oversedated from everything that he is taking right now.  Particularly we might be able to cut back on some of the Depakote because of his concern about weight gain as well.  Continue daily psychoeducation and encouragement.  Mordecai Rasmussen, MD 11/25/2018, 3:53 PM

## 2018-11-25 NOTE — Plan of Care (Signed)
  Problem: Education: Goal: Will be free of psychotic symptoms Outcome: Progressing  Patient appears free of psychosis.  

## 2018-11-25 NOTE — Progress Notes (Signed)
D: Patient remains to isolates to room  . Voice of using science  To identify the functions and mechanical  purpose certain things   . Affect pleasant on approach .  Compliant  With  Medication   Noted to press down on each medication  To deactivate the function of the medication  .  Patient denies any depression, hopelessness or anxiety . Denies suicidally .   No pain concerns . Appropriate ADL'S. Interacting with peers and staff. A: Encourage patient participation with unit programming . Instruction  Given on  Medication , verbalize understanding. R: Voice no other concerns. Staff continue to monitor

## 2018-11-26 NOTE — Plan of Care (Signed)
Pt. Ability to demonstrate self-control is improved. Pt. Denies si/hi, able to contract for safety. Pt. Monitored for safety by staff. Pt. Continues to present with delusions of grandeur and magical thinking.    Problem: Education: Goal: Will be free of psychotic symptoms Outcome: Not Progressing   Problem: Coping: Goal: Ability to demonstrate self-control will improve Outcome: Progressing   Problem: Safety: Goal: Periods of time without injury will increase Outcome: Progressing

## 2018-11-26 NOTE — Plan of Care (Signed)
Patient is calm and appropriate with his behavior.Patient keeps himself busy in his room.No issues verbalized.Compliant with medications.Did not attend groups.Appetite and energy level good.Support and encouragement given.

## 2018-11-26 NOTE — Progress Notes (Signed)
Recreation Therapy Notes  Date: 11/26/2018  Time: 9:30 am   Location: Craft room   Behavioral response: N/A   Intervention Topic: Happiness  Discussion/Intervention: Patient did not attend group.   Clinical Observations/Feedback:  Patient did not attend group.   Davonn Flanery LRT/CTRS        Shandy Vi 11/26/2018 10:59 AM

## 2018-11-26 NOTE — Plan of Care (Signed)
Patient contract for safety and denies any SI/HI/AVH, and denies any depression or anxiety but patient  is still delusional, playing with his hands in a manner like performing a magic trick, and ask you if you see it.. patient is medication compliant with out any side effects, no further physical complaint, mood is good, and affect is appropriate to situations , education and encouragement is provided, encouraged to engage with peers at scheduled group meetings, patient acknowledged, only requiring 15 minutes rounding no distress.   Problem: Education: Goal: Knowledge of [facility] Education information/materials will improve Outcome: Progressing   Problem: Coping: Goal: Ability to verbalize frustrations and anger appropriately will improve Outcome: Progressing Goal: Ability to demonstrate self-control will improve Outcome: Progressing   Problem: Safety: Goal: Periods of time without injury will increase Outcome: Progressing   Problem: Education: Goal: Will be free of psychotic symptoms Outcome: Progressing Goal: Knowledge of the prescribed therapeutic regimen will improve Outcome: Progressing   Problem: Self-Concept: Goal: Will verbalize positive feelings about self Outcome: Progressing   Problem: Education: Goal: Ability to state activities that reduce stress will improve Outcome: Progressing

## 2018-11-26 NOTE — Progress Notes (Signed)
[facility] MD Progress Note  11/26/2018 5:35 PM Jackson [name]  MRN:  [id number] Subjective: Patient seen and chart reviewed.  Patient with schizoaffective disorder.  He remains stable with no aggressive or violent behavior.  Psychotic and disorganized paranoid thinking on deeper conversation but superficially taking care of his basic ADLs.  Patient continues to be in the hospital in part because his mother who is his guardian wants him referred to longer term inpatient hospitalization. Principal Problem: Schizoaffective disorder, bipolar type (HCC) Diagnosis: Principal Problem:   Schizoaffective disorder, bipolar type (HCC) Active Problems:   Noncompliance  Total Time spent with patient: 30 minutes  Past Psychiatric History: Past history of bipolar disorder multiple lengthy hospitalizations  Past Medical History:  Past Medical History:  Diagnosis Date  . Bipolar 1 disorder (HCC)   . Schizoaffective disorder (HCC)    History reviewed. No pertinent surgical history. Family History: History reviewed. No pertinent family history. Family Psychiatric  History: None Social History:  Social History   Substance and Sexual Activity  Alcohol Use Not on file     Social History   Substance and Sexual Activity  Drug Use Not on file    Social History   Socioeconomic History  . Marital status: Single    Spouse name: Not on file  . Number of children: Not on file  . Years of education: Not on file  . Highest education level: Not on file  Occupational History  . Not on file  Social Needs  . Financial resource strain: Not on file  . Food insecurity:    Worry: Not on file    Inability: Not on file  . Transportation needs:    Medical: Not on file    Non-medical: Not on file  Tobacco Use  . Smoking status: Light Tobacco Smoker    Packs/day: 0.25    Types: Cigarettes  . Smokeless tobacco: Never Used  Substance and Sexual Activity  . Alcohol use: Not on file  . Drug use: Not on file  .  Sexual activity: Not on file  Lifestyle  . Physical activity:    Days per week: Not on file    Minutes per session: Not on file  . Stress: Not on file  Relationships  . Social connections:    Talks on phone: Not on file    Gets together: Not on file    Attends religious service: Not on file    Active member of club or organization: Not on file    Attends meetings of clubs or organizations: Not on file    Relationship status: Not on file  Other Topics Concern  . Not on file  Social History Narrative  . Not on file   Additional Social History:                         Sleep: Fair  Appetite:  Fair  Current Medications: Current Facility-Administered Medications  Medication Dose Route Frequency Provider Last Rate Last Dose  . acetaminophen (TYLENOL) tablet 650 mg  650 mg Oral Q6H PRN [name], MD   650 mg at 10/26/18 2302  . alum & mag hydroxide-simeth (MAALOX/MYLANTA) 200-200-20 MG/5ML suspension 30 mL  30 mL Oral Q4H PRN [name], MD   30 mL at 10/26/18 2129  . ARIPiprazole (ABILIFY) tablet 15 mg  15 mg Oral Daily Tarin Navarez, [name], MD   15 mg at 11/26/18 0857  . divalproex (DEPAKOTE) DR tablet 500 mg  500 mg Oral Q12H Dejan Angert, [name], MD   500 mg at 11/26/18 0857  . hydrOXYzine (ATARAX/VISTARIL) tablet 50 mg  50 mg Oral Q4H PRN [name], MD   50 mg at 10/28/18 1610  . magnesium hydroxide (MILK OF MAGNESIA) suspension 30 mL  30 mL Oral Daily PRN [name], MD      . OLANZapine [facility]) injection 10 mg  10 mg Intramuscular Q6H PRN Nella Botsford T, MD      . OLANZapine (ZYPREXA) tablet 20 mg  20 mg Oral Q12H Pearla Mckinny, [name], MD   20 mg at 11/26/18 0857  . traZODone (DESYREL) tablet 100 mg  100 mg Oral QHS PRN [name], MD   100 mg at 11/12/18 2149    Lab Results: No results found for this or any previous visit (from the past 48 hour(s)).  Blood Alcohol level:  Lab Results  Component Value Date   ETH <10 10/16/2018     Metabolic Disorder Labs: Lab Results  Component Value Date   HGBA1C 5.2 10/23/2018   MPG 102.54 10/23/2018   No results found for: PROLACTIN Lab Results  Component Value Date   CHOL 129 10/23/2018   TRIG 58 10/23/2018   HDL 50 10/23/2018   CHOLHDL 2.6 10/23/2018   VLDL 12 10/23/2018   LDLCALC 67 10/23/2018    Physical Findings: AIMS: Facial and Oral Movements Muscles of Facial Expression: None, normal Lips and Perioral Area: None, normal Jaw: None, normal Tongue: None, normal,Extremity Movements Upper (arms, wrists, hands, fingers): None, normal Lower (legs, knees, ankles, toes): None, normal, Trunk Movements Neck, shoulders, hips: None, normal, Overall Severity Severity of abnormal movements (highest score from questions above): None, normal Incapacitation due to abnormal movements: None, normal Patient's awareness of abnormal movements (rate only patient's report): No Awareness, Dental Status Current problems with teeth and/or dentures?: No Does patient usually wear dentures?: No  CIWA:  CIWA-Ar Total: 0 COWS:  COWS Total Score: 0  Musculoskeletal: Strength & Muscle Tone: within normal limits Gait & Station: normal Patient leans: N/A  Psychiatric Specialty Exam: Physical Exam  Nursing note and vitals reviewed. Constitutional: He appears well-developed and well-nourished.  HENT:  Head: Normocephalic and atraumatic.  Eyes: Pupils are equal, round, and reactive to light. Conjunctivae are normal.  Neck: Normal range of motion.  Cardiovascular: Regular rhythm and normal heart sounds.  Respiratory: Effort normal.  GI: Soft.  Musculoskeletal: Normal range of motion.  Neurological: He is alert.  Skin: Skin is warm and dry.  Psychiatric: His affect is blunt. His speech is delayed. He is not agitated. Thought content is paranoid and delusional. Cognition and memory are normal. He expresses impulsivity.    Review of Systems  Constitutional: Negative.   HENT:  Negative.   Eyes: Negative.   Respiratory: Negative.   Cardiovascular: Negative.   Gastrointestinal: Negative.   Musculoskeletal: Negative.   Skin: Negative.   Neurological: Negative.   Psychiatric/Behavioral: Positive for hallucinations.    Blood pressure 101/63, pulse 72, temperature 97.6 F (36.4 C), temperature source Oral, resp. rate 18, height  (1.803 m), weight 88.5 kg, SpO2 100 %.Body mass index is 27.2 kg/m.  General Appearance: Casual  Eye Contact:  Fair  Speech:  Clear and Coherent  Volume:  Normal  Mood:  Euthymic  Affect:  Congruent  Thought Process:  Goal Directed  Orientation:  Full (Time, Place, and Person)  Thought Content:  Logical  Suicidal Thoughts:  No  Homicidal Thoughts:  No  Memory:  Immediate;   Fair Recent;   Fair Remote;   Fair  Judgement:  Fair  Insight:  Fair  Psychomotor Activity:  Normal  Concentration:  Concentration: Fair  Recall:  [employer]  Fund of Knowledge:  Fair  Language:  Fair  Akathisia:  No  Handed:  Right  AIMS (if indicated):     Assets:  Desire for Improvement Resilience  ADL's:  Impaired  Cognition:  Impaired,  Mild  Sleep:  Number of Hours: 5.5     Treatment Plan Summary: Daily contact with patient to assess and evaluate symptoms and progress in treatment, Medication management and Plan No change to medication management.  We are pending approval of possible transfer [facility] while continuing to monitor behavior and engage him in groups and activities on the unit.  No clear indication for any change to medicine.  Check blood levels again probably within another week.  Mordecai RasmussenJohn Gorge Almanza, MD 11/26/2018, 5:35 PM

## 2018-11-26 NOTE — Progress Notes (Signed)
D: Pt. During assessments denies si/hi, contracts for safety, but describes hallucinations of, "holograms" still. Pt. Continues to express magical thinking and delusions of grandeur. Pt. Endorses a mostly normal mood.   A: Q x 15 minute observation checks were completed for safety. Patient was provided with education, but continues to be non-accepting of this. Patient was given/offered medications per orders. Patient  was encourage to attend groups, participate in unit activities and continue with plan of care. Pt. Chart and plans of care reviewed. Pt. Given support and encouragement.   R: Patient is complaint with medications, but still continues to be reluctant to take them, but does so. Pt. Complaint with unit procedures. Pt. Attends groups and snack time, observed eating good. Pt. Interactions with staff and peers appropriate.             Precautionary checks every 15 minutes for safety maintained, room free of safety hazards, patient sustains no injury or falls during this shift. Will endorse care to next shift.

## 2018-11-26 NOTE — [facility] Group Notes (Signed)
LCSW Group Therapy Note  11/26/2018 1:00 PM  Type of Therapy and Topic:  Group Therapy:  Feelings around Relapse and Recovery  Participation Level:  Did Not Attend   Description of Group:    Patients in this group will discuss emotions they experience before and after a relapse. They will process how experiencing these feelings, or avoidance of experiencing them, relates to having a relapse. Facilitator will guide patients to explore emotions they have related to recovery. Patients will be encouraged to process which emotions are more powerful. They will be guided to discuss the emotional reaction significant others in their lives may have to their relapse or recovery. Patients will be assisted in exploring ways to respond to the emotions of others without this contributing to a relapse.  Therapeutic Goals: 1. Patient will identify two or more emotions that lead to a relapse for them 2. Patient will identify two emotions that result when they relapse 3. Patient will identify two emotions related to recovery 4. Patient will demonstrate ability to communicate their needs through discussion and/or role plays   Summary of Patient Progress: X  Therapeutic Modalities:   Cognitive Behavioral Therapy Solution-Focused Therapy Assertiveness Training Relapse Prevention Therapy   Penni Homans, MSW, LCSW 11/26/2018 12:40 PM

## 2018-11-27 NOTE — Progress Notes (Signed)
[facility] MD Progress Note  11/27/2018 1:00 PM Jackson [name]  MRN:  [id number] Subjective: Follow-up with this patient with schizophrenia.  No significant changes from baseline.  Continues to be disorganized and paranoid but not threatening or violent.  Actually appreciates talking and is pleasant for the most part.  A little run down but not oversedated from his medicine. Principal Problem: Schizoaffective disorder, bipolar type (HCC) Diagnosis: Principal Problem:   Schizoaffective disorder, bipolar type (HCC) Active Problems:   Noncompliance  Total Time spent with patient: 20 minutes  Past Psychiatric History: History of bipolar disorder lengthy hospitalizations in the past.  Past Medical History:  Past Medical History:  Diagnosis Date  . Bipolar 1 disorder (HCC)   . Schizoaffective disorder (HCC)    History reviewed. No pertinent surgical history. Family History: History reviewed. No pertinent family history. Family Psychiatric  History: No clear family history Social History:  Social History   Substance and Sexual Activity  Alcohol Use Not on file     Social History   Substance and Sexual Activity  Drug Use Not on file    Social History   Socioeconomic History  . Marital status: Single    Spouse name: Not on file  . Number of children: Not on file  . Years of education: Not on file  . Highest education level: Not on file  Occupational History  . Not on file  Social Needs  . Financial resource strain: Not on file  . Food insecurity:    Worry: Not on file    Inability: Not on file  . Transportation needs:    Medical: Not on file    Non-medical: Not on file  Tobacco Use  . Smoking status: Light Tobacco Smoker    Packs/day: 0.25    Types: Cigarettes  . Smokeless tobacco: Never Used  Substance and Sexual Activity  . Alcohol use: Not on file  . Drug use: Not on file  . Sexual activity: Not on file  Lifestyle  . Physical activity:    Days per week: Not on file     Minutes per session: Not on file  . Stress: Not on file  Relationships  . Social connections:    Talks on phone: Not on file    Gets together: Not on file    Attends religious service: Not on file    Active member of club or organization: Not on file    Attends meetings of clubs or organizations: Not on file    Relationship status: Not on file  Other Topics Concern  . Not on file  Social History Narrative  . Not on file   Additional Social History:                         Sleep: Fair  Appetite:  Fair  Current Medications: Current Facility-Administered Medications  Medication Dose Route Frequency Provider Last Rate Last Dose  . acetaminophen (TYLENOL) tablet 650 mg  650 mg Oral Q6H PRN [name], MD   650 mg at 10/26/18 2302  . alum & mag hydroxide-simeth (MAALOX/MYLANTA) 200-200-20 MG/5ML suspension 30 mL  30 mL Oral Q4H PRN [name], MD   30 mL at 10/26/18 2129  . ARIPiprazole (ABILIFY) tablet 15 mg  15 mg Oral Daily Chrislyn Seedorf, [name], MD   15 mg at 11/27/18 0755  . divalproex (DEPAKOTE) DR tablet 500 mg  500 mg Oral Q12H Kavish Lafitte, [name], MD   500  mg at 11/27/18 0755  . hydrOXYzine (ATARAX/VISTARIL) tablet 50 mg  50 mg Oral Q4H PRN Terance HartStephens, [name], MD   50 mg at 10/28/18 16100918  . magnesium hydroxide (MILK OF MAGNESIA) suspension 30 mL  30 mL Oral Daily PRN Terance HartStephens, [name], MD      . OLANZapine [facility](ZYPREXA) injection 10 mg  10 mg Intramuscular Q6H PRN Narvel Kozub T, MD      . OLANZapine (ZYPREXA) tablet 20 mg  20 mg Oral Q12H Reinette Cuneo, Jackquline DenmarkJohn T, MD   20 mg at 11/27/18 0754  . traZODone (DESYREL) tablet 100 mg  100 mg Oral QHS PRN Terance HartStephens, [name], MD   100 mg at 11/26/18 2123    Lab Results: No results found for this or any previous visit (from the past 48 hour(s)).  Blood Alcohol level:  Lab Results  Component Value Date   ETH <10 10/16/2018    Metabolic Disorder Labs: Lab Results  Component Value Date   HGBA1C 5.2 10/23/2018    MPG 102.54 10/23/2018   No results found for: PROLACTIN Lab Results  Component Value Date   CHOL 129 10/23/2018   TRIG 58 10/23/2018   HDL 50 10/23/2018   CHOLHDL 2.6 10/23/2018   VLDL 12 10/23/2018   LDLCALC 67 10/23/2018    Physical Findings: AIMS: Facial and Oral Movements Muscles of Facial Expression: None, normal Lips and Perioral Area: None, normal Jaw: None, normal Tongue: None, normal,Extremity Movements Upper (arms, wrists, hands, fingers): None, normal Lower (legs, knees, ankles, toes): None, normal, Trunk Movements Neck, shoulders, hips: None, normal, Overall Severity Severity of abnormal movements (highest score from questions above): None, normal Incapacitation due to abnormal movements: None, normal Patient's awareness of abnormal movements (rate only patient's report): No Awareness, Dental Status Current problems with teeth and/or dentures?: No Does patient usually wear dentures?: No  CIWA:  CIWA-Ar Total: 0 COWS:  COWS Total Score: 0  Musculoskeletal: Strength & Muscle Tone: within normal limits Gait & Station: normal Patient leans: N/A  Psychiatric Specialty Exam: Physical Exam  Nursing note and vitals reviewed. Constitutional: He appears well-developed and well-nourished.  HENT:  Head: Normocephalic and atraumatic.  Eyes: Pupils are equal, round, and reactive to light. Conjunctivae are normal.  Neck: Normal range of motion.  Cardiovascular: Regular rhythm and normal heart sounds.  Respiratory: Effort normal. No respiratory distress.  GI: Soft.  Musculoskeletal: Normal range of motion.  Neurological: He is alert.  Skin: Skin is warm and dry.  Psychiatric: Judgment normal. His affect is blunt. His speech is delayed. He is slowed. Thought content is paranoid and delusional. Cognition and memory are normal.    Review of Systems  Constitutional: Negative.   HENT: Negative.   Eyes: Negative.   Respiratory: Negative.   Cardiovascular: Negative.    Gastrointestinal: Negative.   Musculoskeletal: Negative.   Skin: Negative.   Neurological: Negative.   Psychiatric/Behavioral: Positive for hallucinations. The patient is nervous/anxious.     Blood pressure 115/85, pulse 70, temperature 98.4 F (36.9 C), temperature source Oral, resp. rate 16, height 5\' 11"  (1.803 m), weight 88.5 kg, SpO2 100 %.Body mass index is 27.2 kg/m.  General Appearance: Casual  Eye Contact:  Fair  Speech:  Normal Rate  Volume:  Normal  Mood:  Euthymic  Affect:  Constricted  Thought Process:  Disorganized  Orientation:  Full (Time, Place, and Person)  Thought Content:  Illogical, Delusions and Hallucinations: Auditory  Suicidal Thoughts:  No  Homicidal Thoughts:  No  Memory:  Immediate;   Fair Recent;   Fair Remote;   Fair  Judgement:  Fair  Insight:  Fair  Psychomotor Activity:  Decreased  Concentration:  Concentration: Fair  Recall:  [employer] of Knowledge:  Fair  Language:  Fair  Akathisia:  No  Handed:  Right  AIMS (if indicated):     Assets:  Desire for Improvement Physical Health Resilience Social Support  ADL's:  Intact  Cognition:  Impaired,  Mild  Sleep:  Number of Hours: 6.5     Treatment Plan Summary: Daily contact with patient to assess and evaluate symptoms and progress in treatment, Medication management and Plan Stable on medicine.  Possibly close to baseline although still having some psychotic symptoms.  We are waiting for possible transfer to [facility] or for his commitment to run out whichever comes first.  No other disposition is at hand no other group homes taking people in at this time.  Supportive counseling therapy and review of situation with patient.  Mordecai Rasmussen, MD 11/27/2018, 1:00 PM

## 2018-11-27 NOTE — Plan of Care (Signed)
Patient present in the milieu with a steady gait. Alert and oriented and shows the ability to verbalize positive things about himself. Observed in the milieu interacting with select peers. Attends group with active participation. Compliant with medications and meals. Milieu remains safe with 15 minutes safety checks. Will continue to monitor.

## 2018-11-27 NOTE — [facility] Group Notes (Signed)
LCSW Group Therapy Note  11/27/2018 1:15pm  Type of Therapy and Topic:  Group Therapy:  Cognitive Distortions  Participation Level:  Did Not Attend   Description of Group:    Patients in this group will be introduced to the topic of cognitive distortions.  Patients will identify and describe cognitive distortions, describe the feelings these distortions create for them.  Patients will identify one or more situations in their personal life where they have cognitively distorted thinking and will verbalize challenging this cognitive distortion through positive thinking skills.  Patients will practice the skill of using positive affirmations to challenge cognitive distortions using affirmation cards.    Therapeutic Goals:  1. Patient will identify two or more cognitive distortions they have used 2. Patient will identify one or more emotions that stem from use of a cognitive distortion 3. Patient will demonstrate use of a positive affirmation to counter a cognitive distortion through discussion and/or role play. 4. Patient will describe one way cognitive distortions can be detrimental to wellness   Summary of Patient Progress:Pt was invited to attend group but chose not to attend. CSW will continue to encourage pt to attend group throughout their admission.      Therapeutic Modalities:   Cognitive Behavioral Therapy Motivational Interviewing   Kory Rains  [name], LCSW 11/27/2018 3:06 PM

## 2018-11-28 NOTE — [facility] Group Notes (Signed)
LCSW Group Therapy Note 11/28/2018 1:15pm  Type of Therapy and Topic: Group Therapy: Feelings Around Returning Home & Establishing a Supportive Framework and Supporting Oneself When Supports Not Available  Participation Level: Did Not Attend  Description of Group:  Patients first processed thoughts and feelings about upcoming discharge. These included fears of upcoming changes, lack of change, new living environments, judgements and expectations from others and overall stigma of mental health issues. The group then discussed the definition of a supportive framework, what that looks and feels like, and how do to discern it from an unhealthy non-supportive network. The group identified different types of supports as well as what to do when your family/friends are less than helpful or unavailable  Therapeutic Goals  1. Patient will identify one healthy supportive network that they can use at discharge. 2. Patient will identify one factor of a supportive framework and how to tell it from an unhealthy network. 3. Patient able to identify one coping skill to use when they do not have positive supports from others. 4. Patient will demonstrate ability to communicate their needs through discussion and/or role plays.  Summary of Patient Progress:  Pt was invited to attend group but chose not to attend. CSW will continue to encourage pt to attend group throughout their admission.   Therapeutic Modalities Cognitive Behavioral Therapy Motivational Interviewing   Jackson Cantrelle  [name], LCSW 11/28/2018 11:51 AM  

## 2018-11-28 NOTE — Progress Notes (Signed)
D: Pt during assessments denies si/hi, able to contract for safety. Pt. Continues to describe hallucinations of, "holograms", and express magical thinking. Pt. Continues to make grandiose statements about owning several businesses. Pt. Continues to attempt to, "de-activate" the medications he is scheduled to take.   A: Q x 15 minute observation checks were completed for safety. Patient was provided with education, but continues to be non-accepting. Patient was given/offered medications per orders. Patient  was encourage to attend groups, participate in unit activities and continue with plan of care. Pt. Chart and plans of care reviewed. Pt. Given support and encouragement.   R: Patient is complaint with medications this evening with direction and encouragement, but continues to be reluctant to take his medications. Pt. This evening required lots of redirection and limit setting from incident involving another patient that he was teasing. Pt. Continues to be attention-seeking. Pt. Observed eating good this evening. Pt. covid screening complete.             Precautionary checks every 15 minutes for safety maintained, room free of safety hazards, patient sustains no injury or falls during this shift. Will endorse care to next shift.

## 2018-11-28 NOTE — Plan of Care (Signed)
Patient denies any current active or passive suicidal thoughts or psychotic  symptoms and no somatic symptoms,patient is engaging in leisure activities with peers compliant with his  medication and  therapeutic regimen., sleep is intermittent. Only requiring 15 minutes safety checks.   Problem: Education: Goal: Knowledge of [facility] Education information/materials will improve Outcome: Progressing   Problem: Coping: Goal: Ability to verbalize frustrations and anger appropriately will improve Outcome: Progressing Goal: Ability to demonstrate self-control will improve Outcome: Progressing   Problem: Safety: Goal: Periods of time without injury will increase Outcome: Progressing   Problem: Education: Goal: Will be free of psychotic symptoms Outcome: Progressing Goal: Knowledge of the prescribed therapeutic regimen will improve Outcome: Progressing   Problem: Self-Concept: Goal: Will verbalize positive feelings about self Outcome: Progressing   Problem: Education: Goal: Ability to state activities that reduce stress will improve Outcome: Progressing

## 2018-11-28 NOTE — Progress Notes (Signed)
[facility] MD Progress Note  11/28/2018 1:13 PM Jackson [name]  MRN:  [id number] Subjective: Follow-up for this patient with schizoaffective disorder.  No new complaints.  Continues to be delusional and disorganized in his thinking and behavior but not aggressive or violent.  Appears to tolerate medicine well.  Adequate blood levels.  No new complaints other than wanting to find some way that he can get a Clinical research associate. Principal Problem: Schizoaffective disorder, bipolar type (HCC) Diagnosis: Principal Problem:   Schizoaffective disorder, bipolar type (HCC) Active Problems:   Noncompliance  Total Time spent with patient: 30 minutes  Past Psychiatric History: Past history of hospitalization for compliance manic psychosis  Past Medical History:  Past Medical History:  Diagnosis Date  . Bipolar 1 disorder (HCC)   . Schizoaffective disorder (HCC)    History reviewed. No pertinent surgical history. Family History: History reviewed. No pertinent family history. Family Psychiatric  History: Family history noncontributory Social History:  Social History   Substance and Sexual Activity  Alcohol Use Not on file     Social History   Substance and Sexual Activity  Drug Use Not on file    Social History   Socioeconomic History  . Marital status: Single    Spouse name: Not on file  . Number of children: Not on file  . Years of education: Not on file  . Highest education level: Not on file  Occupational History  . Not on file  Social Needs  . Financial resource strain: Not on file  . Food insecurity:    Worry: Not on file    Inability: Not on file  . Transportation needs:    Medical: Not on file    Non-medical: Not on file  Tobacco Use  . Smoking status: Light Tobacco Smoker    Packs/day: 0.25    Types: Cigarettes  . Smokeless tobacco: Never Used  Substance and Sexual Activity  . Alcohol use: Not on file  . Drug use: Not on file  . Sexual activity: Not on file  Lifestyle  .  Physical activity:    Days per week: Not on file    Minutes per session: Not on file  . Stress: Not on file  Relationships  . Social connections:    Talks on phone: Not on file    Gets together: Not on file    Attends religious service: Not on file    Active member of club or organization: Not on file    Attends meetings of clubs or organizations: Not on file    Relationship status: Not on file  Other Topics Concern  . Not on file  Social History Narrative  . Not on file   Additional Social History:                         Sleep: Fair  Appetite:  Fair  Current Medications: Current Facility-Administered Medications  Medication Dose Route Frequency Provider Last Rate Last Dose  . acetaminophen (TYLENOL) tablet 650 mg  650 mg Oral Q6H PRN [name], MD   650 mg at 10/26/18 2302  . alum & mag hydroxide-simeth (MAALOX/MYLANTA) 200-200-20 MG/5ML suspension 30 mL  30 mL Oral Q4H PRN [name], MD   30 mL at 10/26/18 2129  . ARIPiprazole (ABILIFY) tablet 15 mg  15 mg Oral Daily , [name], MD   15 mg at 11/28/18 0804  . divalproex (DEPAKOTE) DR tablet 500 mg  500 mg Oral Q12H  , [name] T, MD   500 mg at 11/28/18 0804  . hydrOXYzine (ATARAX/VISTARIL) tablet 50 mg  50 mg Oral Q4H PRN Terance HartStephens, [name], MD   50 mg at 10/28/18 16100918  . magnesium hydroxide (MILK OF MAGNESIA) suspension 30 mL  30 mL Oral Daily PRN Terance HartStephens, [name], MD      . OLANZapine [facility](ZYPREXA) injection 10 mg  10 mg Intramuscular Q6H PRN ,  T, MD      . OLANZapine (ZYPREXA) tablet 20 mg  20 mg Oral Q12H , [name] T, MD   20 mg at 11/28/18 0804  . traZODone (DESYREL) tablet 100 mg  100 mg Oral QHS PRN Terance HartStephens, [name], MD   100 mg at 11/26/18 2123    Lab Results: No results found for this or any previous visit (from the past 48 hour(s)).  Blood Alcohol level:  Lab Results  Component Value Date   ETH <10 10/16/2018    Metabolic Disorder Labs: Lab Results   Component Value Date   HGBA1C 5.2 10/23/2018   MPG 102.54 10/23/2018   No results found for: PROLACTIN Lab Results  Component Value Date   CHOL 129 10/23/2018   TRIG 58 10/23/2018   HDL 50 10/23/2018   CHOLHDL 2.6 10/23/2018   VLDL 12 10/23/2018   LDLCALC 67 10/23/2018    Physical Findings: AIMS: Facial and Oral Movements Muscles of Facial Expression: None, normal Lips and Perioral Area: None, normal Jaw: None, normal Tongue: None, normal,Extremity Movements Upper (arms, wrists, hands, fingers): None, normal Lower (legs, knees, ankles, toes): None, normal, Trunk Movements Neck, shoulders, hips: None, normal, Overall Severity Severity of abnormal movements (highest score from questions above): None, normal Incapacitation due to abnormal movements: None, normal Patient's awareness of abnormal movements (rate only patient's report): No Awareness, Dental Status Current problems with teeth and/or dentures?: No Does patient usually wear dentures?: No  CIWA:  CIWA-Ar Total: 0 COWS:  COWS Total Score: 0  Musculoskeletal: Strength & Muscle Tone: within normal limits Gait & Station: normal Patient leans: N/A  Psychiatric Specialty Exam: Physical Exam  Nursing note and vitals reviewed. Constitutional: He appears well-developed and well-nourished.  HENT:  Head: Normocephalic and atraumatic.  Eyes: Pupils are equal, round, and reactive to light. Conjunctivae are normal.  Neck: Normal range of motion.  Cardiovascular: Regular rhythm and normal heart sounds.  Respiratory: Effort normal.  GI: Soft.  Musculoskeletal: Normal range of motion.  Neurological: He is alert.  Skin: Skin is warm and dry.  Psychiatric: Judgment normal. His affect is blunt. His speech is tangential. He is slowed. Thought content is delusional. Cognition and memory are normal.    Review of Systems  Constitutional: Negative.   HENT: Negative.   Eyes: Negative.   Respiratory: Negative.   Cardiovascular:  Negative.   Gastrointestinal: Negative.   Musculoskeletal: Negative.   Skin: Negative.   Neurological: Negative.   Psychiatric/Behavioral: Positive for hallucinations. Negative for depression, memory loss, substance abuse and suicidal ideas. The patient is nervous/anxious. The patient does not have insomnia.     Blood pressure 108/73, pulse 71, temperature 97.8 F (36.6 C), temperature source Oral, resp. rate 18, height 5\' 11"  (1.803 m), weight 88.5 kg, SpO2 100 %.Body mass index is 27.2 kg/m.  General Appearance: Casual  Eye Contact:  Minimal  Speech:  Slow  Volume:  Decreased  Mood:  Euthymic  Affect:  Constricted  Thought Process:  Coherent  Orientation:  Full (Time, Place, and Person)  Thought Content:  Logical  Suicidal Thoughts:  No  Homicidal Thoughts:  No  Memory:  Immediate;   Fair Recent;   Poor Remote;   Fair  Judgement:  Fair  Insight:  Shallow  Psychomotor Activity:  Decreased  Concentration:  Concentration: Fair  Recall:  [employer] of Knowledge:  Fair  Language:  Fair  Akathisia:  No  Handed:  Right  AIMS (if indicated):     Assets:  Desire for Improvement Resilience  ADL's:  Intact  Cognition:  Impaired,  Mild  Sleep:  Number of Hours: 1.5     Treatment Plan Summary: Daily contact with patient to assess and evaluate symptoms and progress in treatment, Medication management and Plan Still disorganized and psychotic.  I am going to reassess whether his meds need to be changed.  We are still waiting on possible [facility] if it can be done before his commitment expired.  Mordecai Rasmussen, MD 11/28/2018, 1:13 PM

## 2018-11-28 NOTE — Plan of Care (Signed)
Patient present in the milieu with a steady gait. Alert and oriented and shows the ability to verbalize positive things about himself. "I'm just going to rap today and get my buddy here, (pointing to a peer) to rap with me today." Observed in the milieu interacting with select peers. Attends group with active participation. Compliant with medications and meals. Milieu remains safe with 15 minutes safety checks. Will continue to monitor.

## 2018-11-28 NOTE — Plan of Care (Signed)
Pt. Monitored for safety. Pt. Able to remain safe while on the unit. Pt. Self-control is poor this evening. Pt. In the day room this evening was involved in an altercation with another patient, where he was teasing another patient, that led to upsetting another patient. Pt. Continues to express magical thinking and delusions of grandeur.    Problem: Coping: Goal: Ability to demonstrate self-control will improve Outcome: Not Progressing   Problem: Education: Goal: Will be free of psychotic symptoms Outcome: Not Progressing   Problem: Safety: Goal: Periods of time without injury will increase Outcome: Progressing

## 2018-11-29 MED ORDER — DIVALPROEX SODIUM ER 500 MG PO TB24
1000.0000 mg | ORAL_TABLET | Freq: Every day | ORAL | Status: DC
  Administered 2018-11-29 – 2018-12-22 (×24): 1000 mg via ORAL
  Filled 2018-11-29 (×24): qty 2

## 2018-11-29 MED ORDER — OLANZAPINE 10 MG PO TABS
40.0000 mg | ORAL_TABLET | Freq: Every day | ORAL | Status: DC
  Administered 2018-11-30 – 2018-12-06 (×7): 40 mg via ORAL
  Filled 2018-11-29 (×7): qty 4

## 2018-11-29 NOTE — [facility] Counselor (Signed)
CSW received call from [facility] who asked if pt was still in need of room.  CSW confirmed that patient was still here and in need of bed.  CSW faxed notes from over the weekend to support need for bed placement.  Penni Homans, MSW, LCSW 11/29/2018 10:52 AM
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
## 2018-11-29 NOTE — Progress Notes (Signed)
Recreation Therapy Notes   Date: 11/29/2018  Time: 9:30 am   Location: Craft room   Behavioral response: N/A   Intervention Topic: Anger Management  Discussion/Intervention: Patient did not attend group.   Clinical Observations/Feedback:  Patient did not attend group.   Axle Parfait LRT/CTRS         Christion Leonhard 11/29/2018 10:37 AM

## 2018-11-29 NOTE — Plan of Care (Signed)
Pt. Self-control is improved this evening. Pt. Denies si/hi, able to contract for safety. Pt. During assessments does not speak as much about his delusions of grandeur and magical thinking.    Problem: Coping: Goal: Ability to demonstrate self-control will improve Outcome: Progressing   Problem: Safety: Goal: Periods of time without injury will increase Outcome: Progressing   Problem: Education: Goal: Will be free of psychotic symptoms Outcome: Progressing

## 2018-11-29 NOTE — Progress Notes (Signed)
D: Pt during assessments denies SI/HI, able to contract for safety. Pt is pleasant and cooperative, engages good. Pt. Endorses a normal mood. Pt. Still speaks of delusions of grandeur and magical thinking, but to a lesser degree overall. Pt. Denies pain.   A: Q x 15 minute observation checks were completed for safety. Patient was provided with education, needs reinforcement still. Patient was given/offered medications per orders. Patient  was encourage to attend groups, participate in unit activities and continue with plan of care. Pt. Chart and plans of care reviewed. Pt. Given support and encouragement.   R: Patient is complaint with medications and unit procedures. Pt. Is less reluctant to take his medications he expresses after the attending switch his medications around. Pt. Appears happy about the medication changes. Pt. Attends snack time, observed eating good. Pt. covid screened.              Precautionary checks every 15 minutes for safety maintained, room free of safety hazards, patient sustains no injury or falls during this shift. Will endorse care to next shift.

## 2018-11-29 NOTE — Progress Notes (Signed)
[facility] MD Progress Note  11/29/2018 4:08 PM Jackson [name]  MRN:  [id number] Subjective: Follow-up for this patient with schizoaffective disorder.  Patient was pleasant today.  Interacts well with staff and patients.  When spoken with in detail continues to have some delusions but is functioning adequately on the unit.  Patient asked me if I could adjust his medicine today because he continues to feel tired and fatigued during the daytime.  Denies suicidal or homicidal thoughts. Principal Problem: Schizoaffective disorder, bipolar type (HCC) Diagnosis: Principal Problem:   Schizoaffective disorder, bipolar type (HCC) Active Problems:   Noncompliance  Total Time spent with patient: 30 minutes  Past Psychiatric History: Patient has a history of schizophrenia multiple prior hospitalizations including lengthy ones  Past Medical History:  Past Medical History:  Diagnosis Date  . Bipolar 1 disorder (HCC)   . Schizoaffective disorder (HCC)    History reviewed. No pertinent surgical history. Family History: History reviewed. No pertinent family history. Family Psychiatric  History: See previous Social History:  Social History   Substance and Sexual Activity  Alcohol Use Not on file     Social History   Substance and Sexual Activity  Drug Use Not on file    Social History   Socioeconomic History  . Marital status: Single    Spouse name: Not on file  . Number of children: Not on file  . Years of education: Not on file  . Highest education level: Not on file  Occupational History  . Not on file  Social Needs  . Financial resource strain: Not on file  . Food insecurity:    Worry: Not on file    Inability: Not on file  . Transportation needs:    Medical: Not on file    Non-medical: Not on file  Tobacco Use  . Smoking status: Light Tobacco Smoker    Packs/day: 0.25    Types: Cigarettes  . Smokeless tobacco: Never Used  Substance and Sexual Activity  . Alcohol use: Not on  file  . Drug use: Not on file  . Sexual activity: Not on file  Lifestyle  . Physical activity:    Days per week: Not on file    Minutes per session: Not on file  . Stress: Not on file  Relationships  . Social connections:    Talks on phone: Not on file    Gets together: Not on file    Attends religious service: Not on file    Active member of club or organization: Not on file    Attends meetings of clubs or organizations: Not on file    Relationship status: Not on file  Other Topics Concern  . Not on file  Social History Narrative  . Not on file   Additional Social History:                         Sleep: Fair  Appetite:  Fair  Current Medications: Current Facility-Administered Medications  Medication Dose Route Frequency Provider Last Rate Last Dose  . acetaminophen (TYLENOL) tablet 650 mg  650 mg Oral Q6H PRN [name], MD   650 mg at 10/26/18 2302  . alum & mag hydroxide-simeth (MAALOX/MYLANTA) 200-200-20 MG/5ML suspension 30 mL  30 mL Oral Q4H PRN [name], MD   30 mL at 10/26/18 2129  . ARIPiprazole (ABILIFY) tablet 15 mg  15 mg Oral Daily Niyati Heinke, [name], MD   15 mg at 11/29/18 0752  .  divalproex (DEPAKOTE) DR tablet 500 mg  500 mg Oral Q12H Pharrell Ledford, [name], MD   500 mg at 11/29/18 0753  . hydrOXYzine (ATARAX/VISTARIL) tablet 50 mg  50 mg Oral Q4H PRN [name], MD   50 mg at 10/28/18 4098  . magnesium hydroxide (MILK OF MAGNESIA) suspension 30 mL  30 mL Oral Daily PRN [name], MD      . OLANZapine [facility]) injection 10 mg  10 mg Intramuscular Q6H PRN Johm Pfannenstiel T, MD      . OLANZapine (ZYPREXA) tablet 20 mg  20 mg Oral Q12H Jacky Dross, [name], MD   20 mg at 11/29/18 0752  . traZODone (DESYREL) tablet 100 mg  100 mg Oral QHS PRN [name], MD   100 mg at 11/26/18 2123    Lab Results: No results found for this or any previous visit (from the past 48 hour(s)).  Blood Alcohol level:  Lab Results  Component  Value Date   ETH <10 10/16/2018    Metabolic Disorder Labs: Lab Results  Component Value Date   HGBA1C 5.2 10/23/2018   MPG 102.54 10/23/2018   No results found for: PROLACTIN Lab Results  Component Value Date   CHOL 129 10/23/2018   TRIG 58 10/23/2018   HDL 50 10/23/2018   CHOLHDL 2.6 10/23/2018   VLDL 12 10/23/2018   LDLCALC 67 10/23/2018    Physical Findings: AIMS: Facial and Oral Movements Muscles of Facial Expression: None, normal Lips and Perioral Area: None, normal Jaw: None, normal Tongue: None, normal,Extremity Movements Upper (arms, wrists, hands, fingers): None, normal Lower (legs, knees, ankles, toes): None, normal, Trunk Movements Neck, shoulders, hips: None, normal, Overall Severity Severity of abnormal movements (highest score from questions above): None, normal Incapacitation due to abnormal movements: None, normal Patient's awareness of abnormal movements (rate only patient's report): No Awareness, Dental Status Current problems with teeth and/or dentures?: No Does patient usually wear dentures?: No  CIWA:  CIWA-Ar Total: 0 COWS:  COWS Total Score: 0  Musculoskeletal: Strength & Muscle Tone: within normal limits Gait & Station: normal Patient leans: N/A  Psychiatric Specialty Exam: Physical Exam  Nursing note and vitals reviewed. Constitutional: He appears well-developed and well-nourished.  HENT:  Head: Normocephalic and atraumatic.  Eyes: Pupils are equal, round, and reactive to light. Conjunctivae are normal.  Neck: Normal range of motion.  Cardiovascular: Regular rhythm and normal heart sounds.  Respiratory: Effort normal. No respiratory distress.  GI: Soft.  Musculoskeletal: Normal range of motion.  Neurological: He is alert.  Skin: Skin is warm and dry.  Psychiatric: His affect is blunt. His speech is delayed. He is slowed. Thought content is paranoid and delusional. Cognition and memory are normal. He expresses inappropriate judgment.  He expresses no homicidal and no suicidal ideation.    Review of Systems  Constitutional: Positive for malaise/fatigue.  HENT: Negative.   Eyes: Negative.   Respiratory: Negative.   Cardiovascular: Negative.   Gastrointestinal: Negative.   Musculoskeletal: Negative.   Skin: Negative.   Neurological: Negative.   Psychiatric/Behavioral: Negative.     Blood pressure 95/61, pulse 72, temperature 98.6 F (37 C), temperature source Oral, resp. rate 17, height  (1.803 m), weight 88.5 kg, SpO2 99 %.Body mass index is 27.2 kg/m.  General Appearance: Casual  Eye Contact:  Fair  Speech:  Clear and Coherent  Volume:  Decreased  Mood:  Euthymic  Affect:  Constricted  Thought Process:  Coherent  Orientation:  Full (Time, Place,  and Person)  Thought Content:  Logical  Suicidal Thoughts:  No  Homicidal Thoughts:  No  Memory:  Immediate;   Fair Recent;   Fair Remote;   Fair  Judgement:  Fair  Insight:  Fair  Psychomotor Activity:  Normal  Concentration:  Concentration: Fair  Recall:  [employer]Fair  Fund of Knowledge:  Fair  Language:  Fair  Akathisia:  No  Handed:  Right  AIMS (if indicated):     Assets:  Desire for Improvement Physical Health  ADL's:  Intact  Cognition:  Impaired,  Mild  Sleep:  Number of Hours: 1.5     Treatment Plan Summary: Daily contact with patient to assess and evaluate symptoms and progress in treatment, Medication management and Plan Patient continues to complain of being tired during the day.  I do note however that he also sleeps hardly at all at night.  Is much as possible I will try to shift all of his medicine to the nighttime.  He continues to have some psychotic symptoms but has not been aggressive or violent however we have no other place for him to stay right now and are waiting on [facility] the state hospital or other placement to come through.  Mordecai RasmussenJohn Altovise Wahler, MD 11/29/2018, 4:08 PM

## 2018-11-29 NOTE — Plan of Care (Signed)
Patient appropriate in the unit.Patient verbalized that he is sleeping more during days.Medication adjustments done.Denies SI,HI and AVH.Appetite and energy level good.Compliant with medications.Support and encouragement given.

## 2018-11-29 NOTE — [facility] Group Notes (Signed)
Overcoming Obstacles  11/29/2018 1PM  Type of Therapy and Topic:  Group Therapy:  Overcoming Obstacles  Participation Level:  Did Not Attend    Description of Group:    In this group patients will be encouraged to explore what they see as obstacles to their own wellness and recovery. They will be guided to discuss their thoughts, feelings, and behaviors related to these obstacles. The group will process together ways to cope with barriers, with attention given to specific choices patients can make. Each patient will be challenged to identify changes they are motivated to make in order to overcome their obstacles. This group will be process-oriented, with patients participating in exploration of their own experiences as well as giving and receiving support and challenge from other group members.   Therapeutic Goals: 1. Patient will identify personal and current obstacles as they relate to admission. 2. Patient will identify barriers that currently interfere with their wellness or overcoming obstacles.  3. Patient will identify feelings, thought process and behaviors related to these barriers. 4. Patient will identify two changes they are willing to make to overcome these obstacles:      Summary of Patient Progress     Therapeutic Modalities:   Cognitive Behavioral Therapy Solution Focused Therapy Motivational Interviewing Relapse Prevention Therapy    Dorethy Tomey, MSW, LCSW 11/29/2018 2:01 PM  

## 2018-11-30 NOTE — [facility] Group Notes (Signed)
  LCSW Group Therapy Note  11/30/2018 1:00 PM   Type of Therapy/Topic:  Group Therapy:  Feelings about Diagnosis  Participation Level:  Did Not Attend   Description of Group:   This group will allow patients to explore their thoughts and feelings about diagnoses they have received. Patients will be guided to explore their level of understanding and acceptance of these diagnoses. Facilitator will encourage patients to process their thoughts and feelings about the reactions of others to their diagnosis and will guide patients in identifying ways to discuss their diagnosis with significant others in their lives. This group will be process-oriented, with patients participating in exploration of their own experiences, giving and receiving support, and processing challenge from other group members.   Therapeutic Goals: 1. Patient will demonstrate understanding of diagnosis as evidenced by identifying two or more symptoms of the disorder 2. Patient will be able to express two feelings regarding the diagnosis 3. Patient will demonstrate their ability to communicate their needs through discussion and/or role play  Summary of Patient Progress: x   Therapeutic Modalities:   Cognitive Behavioral Therapy Brief Therapy Feelings Identification    Iris Pert, MSW, LCSW Clinical Social Work 11/30/2018 1:00 PM

## 2018-11-30 NOTE — Progress Notes (Signed)
Recreation Therapy Notes  Date: 11/30/2018  Time: 9:30 am   Location: Craft room   Behavioral response: N/A   Intervention Topic: Values   Discussion/Intervention: Patient did not attend group.   Clinical Observations/Feedback:  Patient did not attend group.   Manuelito Poage LRT/CTRS         Nekita Pita 11/30/2018 10:14 AM 

## 2018-11-30 NOTE — Progress Notes (Signed)
[facility]  11/30/2018 3:16 PM Landry Mellowerrell Welz  MRN:  [id number] Subjective: Follow-up for this patient with schizoaffective disorder.  He feels a little better after the adjustment of his medicine to have a less sedating medicine in the morning.  Patient has no specific new complaints.  Wants to call DSS and filed a complaint against his mother which I acknowledged as being not unreasonable.  He is not expressing any suicidal or homicidal thought.  Not acting out in any sort of violent way.  Patient has been generally calm and lucid. Principal Problem: Schizoaffective disorder, bipolar type (HCC) Diagnosis: Principal Problem:   Schizoaffective disorder, bipolar type (HCC) Active Problems:   Noncompliance  Total Time spent with patient: 30 minutes  Past Psychiatric History: Patient has a past history of psychotic disorder bipolar disorder several prior hospitalizations  Past Medical History:  Past Medical History:  Diagnosis Date  . Bipolar 1 disorder (HCC)   . Schizoaffective disorder (HCC)    History reviewed. No pertinent surgical history. Family History: History reviewed. No pertinent family history. Family Psychiatric  History: See previous Social History:  Social History   Substance and Sexual Activity  Alcohol Use Not on file     Social History   Substance and Sexual Activity  Drug Use Not on file    Social History   Socioeconomic History  . Marital status: Single    Spouse name: Not on file  . Number of children: Not on file  . Years of education: Not on file  . Highest education level: Not on file  Occupational History  . Not on file  Social Needs  . Financial resource strain: Not on file  . Food insecurity:    Worry: Not on file    Inability: Not on file  . Transportation needs:    Medical: Not on file    Non-medical: Not on file  Tobacco Use  . Smoking status: Light Tobacco Smoker    Packs/day: 0.25    Types: Cigarettes  . Smokeless  tobacco: Never Used  Substance and Sexual Activity  . Alcohol use: Not on file  . Drug use: Not on file  . Sexual activity: Not on file  Lifestyle  . Physical activity:    Days per week: Not on file    Minutes per session: Not on file  . Stress: Not on file  Relationships  . Social connections:    Talks on phone: Not on file    Gets together: Not on file    Attends religious service: Not on file    Active member of club or organization: Not on file    Attends meetings of clubs or organizations: Not on file    Relationship status: Not on file  Other Topics Concern  . Not on file  Social History Narrative  . Not on file   Additional Social History:                         Sleep: Fair  Appetite:  Fair  Current Medications: Current Facility-Administered Medications  Medication Dose Route Frequency Provider Last Rate Last Dose  . acetaminophen (TYLENOL) tablet 650 mg  650 mg Oral Q6H PRN Terance HartStephens, [name], MD   650 mg at 10/26/18 2302  . alum & mag hydroxide-simeth (MAALOX/MYLANTA) 200-200-20 MG/5ML suspension 30 mL  30 mL Oral Q4H PRN Terance HartStephens, [name], MD   30 mL at 10/26/18 2129  . ARIPiprazole (ABILIFY) tablet  15 mg  15 mg Oral Daily Ismail Graziani, [name], MD   15 mg at 11/30/18 0748  . divalproex (DEPAKOTE ER) 24 hr tablet 1,000 mg  1,000 mg Oral QHS Forestine Macho T, MD   1,000 mg at 11/29/18 2109  . hydrOXYzine (ATARAX/VISTARIL) tablet 50 mg  50 mg Oral Q4H PRN [name], MD   50 mg at 10/28/18 8469  . magnesium hydroxide (MILK OF MAGNESIA) suspension 30 mL  30 mL Oral Daily PRN [name], MD      . OLANZapine  [facility]) injection 10 mg  10 mg Intramuscular Q6H PRN Charrie Mcconnon T, MD      . OLANZapine (ZYPREXA) tablet 40 mg  40 mg Oral QHS Sabrina Arriaga T, MD      . traZODone (DESYREL) tablet 100 mg  100 mg Oral QHS PRN [name], MD   100 mg at 11/26/18 2123    Lab Results: No results found for this or any previous visit (from the  past 48 hour(s)).  Blood Alcohol level:  Lab Results  Component Value Date   ETH <10 10/16/2018    Metabolic Disorder Labs: Lab Results  Component Value Date   HGBA1C 5.2 10/23/2018   MPG 102.54 10/23/2018   No results found for: PROLACTIN Lab Results  Component Value Date   CHOL 129 10/23/2018   TRIG 58 10/23/2018   HDL 50 10/23/2018   CHOLHDL 2.6 10/23/2018   VLDL 12 10/23/2018   LDLCALC 67 10/23/2018    Physical Findings: AIMS: Facial and Oral Movements Muscles of Facial Expression: None, normal Lips and Perioral Area: None, normal Jaw: None, normal Tongue: None, normal,Extremity Movements Upper (arms, wrists, hands, fingers): None, normal Lower (legs, knees, ankles, toes): None, normal, Trunk Movements Neck, shoulders, hips: None, normal, Overall Severity Severity of abnormal movements (highest score from questions above): None, normal Incapacitation due to abnormal movements: None, normal Patient's awareness of abnormal movements (rate only patient's report): No Awareness, Dental Status Current problems with teeth and/or dentures?: No Does patient usually wear dentures?: No  CIWA:  CIWA-Ar Total: 0 COWS:  COWS Total Score: 0  Musculoskeletal: Strength & Muscle Tone: within normal limits Gait & Station: normal Patient leans: N/A  Psychiatric Specialty Exam: Physical Exam  Nursing note and vitals reviewed. Constitutional: He appears well-developed and well-nourished.  HENT:  Head: Normocephalic and atraumatic.  Eyes: Pupils are equal, round, and reactive to light. Conjunctivae are normal.  Neck: Normal range of motion.  Cardiovascular: Regular rhythm and normal heart sounds.  Respiratory: Effort normal.  GI: Soft.  Musculoskeletal: Normal range of motion.  Neurological: He is alert.  Skin: Skin is warm and dry.  Psychiatric: He has a normal mood and affect. His speech is normal and behavior is normal. Judgment and thought content normal. Cognition and  memory are normal.    Review of Systems  Constitutional: Negative.   HENT: Negative.   Eyes: Negative.   Respiratory: Negative.   Cardiovascular: Negative.   Gastrointestinal: Negative.   Musculoskeletal: Negative.   Skin: Negative.   Neurological: Negative.   Psychiatric/Behavioral: Negative.     Blood pressure 95/61, pulse 70, temperature 98.2 F (36.8 C), temperature source Oral, resp. rate 18, height  (1.803 m), weight 88.5 kg, SpO2 100 %.Body mass index is 27.2 kg/m.  General Appearance: Fairly Groomed  Eye Contact:  Fair  Speech:  Clear and Coherent  Volume:  Normal  Mood:  Euthymic  Affect:  Congruent  Thought Process:  Goal Directed  Orientation:  Full (Time, Place, and Person)  Thought Content:  Delusions  Suicidal Thoughts:  No  Homicidal Thoughts:  No  Memory:  Immediate;   Fair Recent;   Fair Remote;   Fair  Judgement:  Impaired  Insight:  Shallow  Psychomotor Activity:  Normal  Concentration:  Concentration: Fair  Recall:  [employer] of Knowledge:  Fair  Language:  Fair  Akathisia:  No  Handed:  Right  AIMS (if indicated):     Assets:  Desire for Improvement Physical Health  ADL's:  Impaired  Cognition:  Impaired,  Mild  Sleep:  Number of Hours: 5.5     Treatment Plan Summary: Daily contact with patient to assess and evaluate symptoms and progress in treatment, Medication management and Plan Patient wants to make the point to me that he is not nearly sick enough to require long-term hospitalizations at a state hospital.  I acknowledge that from his current presentation that if he were to stay on his medicine that is probably a reasonable argument.  However his mother is still his guardian and we have no place for him to stay.  We await a referral to [facility] that might or might not ever happen.  Meanwhile trying to at least get his medicine stabilized so that when his commitment runs out we have everything in place to push for a discharge  to what ever level of care we can find.  Mordecai Rasmussen, MD 11/30/2018, 3:16 PM

## 2018-11-30 NOTE — Progress Notes (Signed)
D: Patient stated slept good last night .Stated appetite  good and energy level  normal. Stated concentration is good . Stated no Depression scale , hopeless or anxiety .Denies suicidal  homicidal ideations  .  No auditory hallucinations  No pain concerns . Appropriate ADL'S. Interacting with peers and staff. Not  attend unit programing  Patient aware of  information  given relating to [facility] education , patient  able to verbalize  understanding .  No anger outburst for past 24 hours . Able to maintained  self control . Voice of no safety concerns. Thought process improving. Compliant  with medication. Patient able to verbalize positive feeling of self .  Marland Kitchen Patient remains delusional , with magical thinking . Patient calling Adult Protective Services  To lodge a compliant  ageist his mother.   A: Encourage patient participation with unit programming . Instruction  Given on  Medication , verbalize understanding. R: Voice no other concerns. Staff continue to monitor

## 2018-11-30 NOTE — Plan of Care (Signed)
Not  attend unit programing  Patient aware of  information  given relating to [facility] education , patient  able to verbalize  understanding .  No anger outburst for past 24 hours . Able to maintained  self control . Voice of no safety concerns. Thought process improving. Compliant  with medication. Patient able to verbalize positive feeling of self .  Marland Kitchen Patient remains delusional , with magical thinking .   Problem: Education: Goal: Knowledge of [place] [facility] Education information/materials will improve Outcome: Progressing   Problem: Coping: Goal: Ability to verbalize frustrations and anger appropriately will improve Outcome: Progressing Goal: Ability to demonstrate self-control will improve Outcome: Progressing   Problem: Safety: Goal: Periods of time without injury will increase Outcome: Progressing   Problem: Education: Goal: Will be free of psychotic symptoms Outcome: Progressing Goal: Knowledge of the prescribed therapeutic regimen will improve Outcome: Progressing   Problem: Self-Concept: Goal: Will verbalize positive feelings about self Outcome: Progressing   Problem: Education: Goal: Ability to state activities that reduce stress will improve Outcome: Progressing

## 2018-12-01 NOTE — Progress Notes (Signed)
Recreation Therapy Notes  Date: 12/01/2018  Time: 9:30 am   Location: Craft room   Behavioral response: N/A   Intervention Topic: Communication  Discussion/Intervention: Patient did not attend group.   Clinical Observations/Feedback:  Patient did not attend group.   Beau Ramsburg LRT/CTRS        Jackson [name] 12/01/2018 11:33 AM 

## 2018-12-01 NOTE — [facility] Group Notes (Signed)
LCSW Group Therapy Note  12/01/2018 1:00 PM  Type of Therapy/Topic:  Group Therapy:  Emotion Regulation  Participation Level:  Did Not Attend   Description of Group:   The purpose of this group is to assist patients in learning to regulate negative emotions and experience positive emotions. Patients will be guided to discuss ways in which they have been vulnerable to their negative emotions. These vulnerabilities will be juxtaposed with experiences of positive emotions or situations, and patients will be challenged to use positive emotions to combat negative ones. Special emphasis will be placed on coping with negative emotions in conflict situations, and patients will process healthy conflict resolution skills.  Therapeutic Goals: 1. Patient will identify two positive emotions or experiences to reflect on in order to balance out negative emotions 2. Patient will label two or more emotions that they find the most difficult to experience 3. Patient will demonstrate positive conflict resolution skills through discussion and/or role plays  Summary of Patient Progress: X  Therapeutic Modalities:   Cognitive Behavioral Therapy Feelings Identification Dialectical Behavioral Therapy  Penni Homans, MSW, LCSW 12/01/2018 2:26 PM

## 2018-12-01 NOTE — Progress Notes (Signed)
Patient is isolating in his , maintaining safety of self and others , received his Pm  medications  With out any issues, verbally contract for safety, aware of positive coping skills, writing and reading to him self as a form of coping mechanism, reports that he is doing ok, denies anxiety and depressions., thoughts are coherent and logical , sleep is continuous requiring only 15 minutes safety checks no distress.

## 2018-12-01 NOTE — Progress Notes (Signed)
D: Patient stated slept good last night .Stated appetite is good and energy level  Is normal. Stated concentration is good . Stated no Depression, hopeless or anxiety.  Denies suicidal  homicidal ideations  .  No auditory hallucinations  No pain concerns . Appropriate ADL'S. Interacting with peers and staff. Some   attendance  to  unit programing  Patient aware of  information  given  , patient  able to verbalize  understanding .  No anger outburst for past 24 hours . Able to maintained  self control . Voice of no safety concerns. Thought process improving. Compliant  with medication. Patient able to verbalize positive feeling of self .  Jackson [name] Patient remains delusional , with magical thinking .   A: Encourage patient participation with unit programming . Instruction  Given on  Medication , verbalize understanding.  R: Voice no other concerns. Staff continue to monitor

## 2018-12-01 NOTE — B[facility] Counselor (Signed)
CSW contacted the patient's mother to update on the patient's progress.  CSW reports that pt has had fewer outbursts and instances of aggression.  CSW reported that the patient has indicated that he would like to make a complaint against his mother as his guardian and CSW provided the number for APS.    CSW checked in if mother was agreeable to have the patient go to a group home at this time due to decrease in the patient's behaviors and mother expressed concern that once the patient is out of the hospital negative behaviors will return and patient will become aggressive, violent and not take his medication which led to the patient being in the hospital in the first place.  CSW spoke with mother about speaking with group home and any outpatient medical providers about the importance of keeping medications consistent and speaking with mother regarding any medicine changes since she is the legal guardian.   Mother requested that CSW speak with [facility] about concerns to see if that would boost the patients chances of being placed on the priority list.   Mother remained opposed to group home at this time, however, is willing to reconsider based off reports from [facility].  [place], MSW, LCSW 12/01/2018 4:17 PM

## 2018-12-01 NOTE — Plan of Care (Signed)
Some   attendance  to  unit programing  Patient aware of  information  given relating to [facility] education , patient  able to verbalize  understanding .  No anger outburst for past 24 hours . Able to maintained  self control . Voice of no safety concerns. Thought process improving. Compliant  with medication. Patient able to verbalize positive feeling of self .  Marland Kitchen Patient remains delusional , with magical thinking .   Problem: Education: Goal: Knowledge of  General Education information/materials will improve Outcome: Progressing   Problem: Coping: Goal: Ability to verbalize frustrations and anger appropriately will improve Outcome: Progressing Goal: Ability to demonstrate self-control will improve Outcome: Progressing   Problem: Safety: Goal: Periods of time without injury will increase Outcome: Progressing   Problem: Education: Goal: Will be free of psychotic symptoms Outcome: Progressing Goal: Knowledge of the prescribed therapeutic regimen will improve Outcome: Progressing   Problem: Self-Concept: Goal: Will verbalize positive feelings about self Outcome: Progressing   Problem: Education: Goal: Ability to state activities that reduce stress will improve Outcome: Progressing

## 2018-12-01 NOTE — Progress Notes (Signed)
[facility] MD Progress Note  12/01/2018 3:32 PM Jackson [name]  MRN:  [id number] Subjective: Patient seen chart reviewed.  Patient has no new complaints today.  Continues to take his medicine.  He actually has a very neatly organized plan on his desk today about trying to contact social services in order to regain his guardianship.  No evidence of acute psychosis.  No violence no aggression no dangerous behavior. Principal Problem: Schizoaffective disorder, bipolar type (HCC) Diagnosis: Principal Problem:   Schizoaffective disorder, bipolar type (HCC) Active Problems:   Noncompliance  Total Time spent with patient: 30 minutes  Past Psychiatric History: Patient has a past history of schizoaffective disorder  Past Medical History:  Past Medical History:  Diagnosis Date  . Bipolar 1 disorder (HCC)   . Schizoaffective disorder (HCC)    History reviewed. No pertinent surgical history. Family History: History reviewed. No pertinent family history. Family Psychiatric  History: See previous Social History:  Social History   Substance and Sexual Activity  Alcohol Use Not on file     Social History   Substance and Sexual Activity  Drug Use Not on file    Social History   Socioeconomic History  . Marital status: Single    Spouse name: Not on file  . Number of children: Not on file  . Years of education: Not on file  . Highest education level: Not on file  Occupational History  . Not on file  Social Needs  . Financial resource strain: Not on file  . Food insecurity:    Worry: Not on file    Inability: Not on file  . Transportation needs:    Medical: Not on file    Non-medical: Not on file  Tobacco Use  . Smoking status: Light Tobacco Smoker    Packs/day: 0.25    Types: Cigarettes  . Smokeless tobacco: Never Used  Substance and Sexual Activity  . Alcohol use: Not on file  . Drug use: Not on file  . Sexual activity: Not on file  Lifestyle  . Physical activity:    Days per  week: Not on file    Minutes per session: Not on file  . Stress: Not on file  Relationships  . Social connections:    Talks on phone: Not on file    Gets together: Not on file    Attends religious service: Not on file    Active member of club or organization: Not on file    Attends meetings of clubs or organizations: Not on file    Relationship status: Not on file  Other Topics Concern  . Not on file  Social History Narrative  . Not on file   Additional Social History:                         Sleep: Fair  Appetite:  Fair  Current Medications: Current Facility-Administered Medications  Medication Dose Route Frequency Provider Last Rate Last Dose  . acetaminophen (TYLENOL) tablet 650 mg  650 mg Oral Q6H PRN [name], MD   650 mg at 10/26/18 2302  . alum & mag hydroxide-simeth (MAALOX/MYLANTA) 200-200-20 MG/5ML suspension 30 mL  30 mL Oral Q4H PRN [name], MD   30 mL at 10/26/18 2129  . ARIPiprazole (ABILIFY) tablet 15 mg  15 mg Oral Daily Khalib Fendley, [name], MD   15 mg at 12/01/18 0803  . divalproex (DEPAKOTE ER) 24 hr tablet 1,000 mg  1,000 mg  Oral QHS Dashae Wilcher, [name], MD   1,000 mg at 11/30/18 2152  . hydrOXYzine (ATARAX/VISTARIL) tablet 50 mg  50 mg Oral Q4H PRN [name], MD   50 mg at 10/28/18 4401  . magnesium hydroxide (MILK OF MAGNESIA) suspension 30 mL  30 mL Oral Daily PRN [name], MD      . OLANZapine [facility]) injection 10 mg  10 mg Intramuscular Q6H PRN Roark Rufo T, MD      . OLANZapine (ZYPREXA) tablet 40 mg  40 mg Oral QHS Shakura Cowing, [name], MD   40 mg at 11/30/18 2152  . traZODone (DESYREL) tablet 100 mg  100 mg Oral QHS PRN [name], MD   100 mg at 11/26/18 2123    Lab Results: No results found for this or any previous visit (from the past 48 hour(s)).  Blood Alcohol level:  Lab Results  Component Value Date   ETH <10 10/16/2018    Metabolic Disorder Labs: Lab Results  Component Value Date    HGBA1C 5.2 10/23/2018   MPG 102.54 10/23/2018   No results found for: PROLACTIN Lab Results  Component Value Date   CHOL 129 10/23/2018   TRIG 58 10/23/2018   HDL 50 10/23/2018   CHOLHDL 2.6 10/23/2018   VLDL 12 10/23/2018   LDLCALC 67 10/23/2018    Physical Findings: AIMS: Facial and Oral Movements Muscles of Facial Expression: None, normal Lips and Perioral Area: None, normal Jaw: None, normal Tongue: None, normal,Extremity Movements Upper (arms, wrists, hands, fingers): None, normal Lower (legs, knees, ankles, toes): None, normal, Trunk Movements Neck, shoulders, hips: None, normal, Overall Severity Severity of abnormal movements (highest score from questions above): None, normal Incapacitation due to abnormal movements: None, normal Patient's awareness of abnormal movements (rate only patient's report): No Awareness, Dental Status Current problems with teeth and/or dentures?: No Does patient usually wear dentures?: No  CIWA:  CIWA-Ar Total: 0 COWS:  COWS Total Score: 0  Musculoskeletal: Strength & Muscle Tone: within normal limits Gait & Station: normal Patient leans: N/A  Psychiatric Specialty Exam: Physical Exam  Nursing note and vitals reviewed. Constitutional: He appears well-developed and well-nourished.  HENT:  Head: Normocephalic and atraumatic.  Eyes: Pupils are equal, round, and reactive to light. Conjunctivae are normal.  Neck: Normal range of motion.  Cardiovascular: Regular rhythm and normal heart sounds.  Respiratory: Effort normal.  GI: Soft.  Musculoskeletal: Normal range of motion.  Neurological: He is alert.  Skin: Skin is warm and dry.  Psychiatric: He has a normal mood and affect. His behavior is normal. Judgment and thought content normal.    Review of Systems  Constitutional: Negative.   HENT: Negative.   Eyes: Negative.   Respiratory: Negative.   Cardiovascular: Negative.   Gastrointestinal: Negative.   Musculoskeletal: Negative.    Skin: Negative.   Neurological: Negative.   Psychiatric/Behavioral: Negative.     Blood pressure 108/82, pulse 67, temperature 97.8 F (36.6 C), temperature source Oral, resp. rate 18, height  (1.803 m), weight 88.5 kg, SpO2 100 %.Body mass index is 27.2 kg/m.  General Appearance: Casual  Eye Contact:  Fair  Speech:  Clear and Coherent  Volume:  Normal  Mood:  Euthymic  Affect:  Constricted  Thought Process:  Goal Directed  Orientation:  Full (Time, Place, and Person)  Thought Content:  Logical  Suicidal Thoughts:  No  Homicidal Thoughts:  No  Memory:  Immediate;   Fair Recent;   Fair Remote;  Fair  Judgement:  Fair  Insight:  Fair  Psychomotor Activity:  Normal  Concentration:  Concentration: Fair  Recall:  [employer]  Fund of Knowledge:  Fair  Language:  Fair  Akathisia:  No  Handed:  Right  AIMS (if indicated):     Assets:  Desire for Improvement Physical Health Resilience  ADL's:  Impaired  Cognition:  WNL  Sleep:  Number of Hours: 4.5     Treatment Plan Summary: Daily contact with patient to assess and evaluate symptoms and progress in treatment, Medication management and Plan Starting to sleep a little more consistently well.  Not showing any acutely dangerous behavior.  Currently on dischargeable because of the lack of any place for him to live or any agreement from his mother about anything other than a stay hospital stay.  Social work will keep working on it particularly if it looks like there might at some point be openings at group homes to use as an option.  Mordecai RasmussenJohn Royale Lennartz, MD 12/01/2018, 3:32 PM

## 2018-12-02 NOTE — Progress Notes (Signed)
[facility], [place] MD Progress Note  12/02/2018 2:58 PM Jackson [name]  MRN:  [id number] Subjective: Follow-up for this patient with schizoaffective disorder.  He seems to be really making some progress getting organized with his plan to try and petition the court to have his guardianship back.  He is organizing the right paperwork and needs to call the court in [place].  Not causing any trouble here.  Neatly dressed and groomed.  Thoughts seem to be well organized.  Not focusing on anything delusional. Principal Problem: Schizoaffective disorder, bipolar type (HCC) Diagnosis: Principal Problem:   Schizoaffective disorder, bipolar type (HCC) Active Problems:   Noncompliance  Total Time spent with patient: 20 minutes  Past Psychiatric History: Past history of extended stays in hospitals to get stable  Past Medical History:  Past Medical History:  Diagnosis Date  . Bipolar 1 disorder (HCC)   . Schizoaffective disorder (HCC)    History reviewed. No pertinent surgical history. Family History: History reviewed. No pertinent family history. Family Psychiatric  History: None Social History:  Social History   Substance and Sexual Activity  Alcohol Use Not on file     Social History   Substance and Sexual Activity  Drug Use Not on file    Social History   Socioeconomic History  . Marital status: Single    Spouse name: Not on file  . Number of children: Not on file  . Years of education: Not on file  . Highest education level: Not on file  Occupational History  . Not on file  Social Needs  . Financial resource strain: Not on file  . Food insecurity:    Worry: Not on file    Inability: Not on file  . Transportation needs:    Medical: Not on file    Non-medical: Not on file  Tobacco Use  . Smoking status: Light Tobacco Smoker    Packs/day: 0.25    Types: Cigarettes  . Smokeless tobacco: Never Used  Substance and Sexual Activity  . Alcohol use: Not on file  . Drug use: Not  on file  . Sexual activity: Not on file  Lifestyle  . Physical activity:    Days per week: Not on file    Minutes per session: Not on file  . Stress: Not on file  Relationships  . Social connections:    Talks on phone: Not on file    Gets together: Not on file    Attends religious service: Not on file    Active member of club or organization: Not on file    Attends meetings of clubs or organizations: Not on file    Relationship status: Not on file  Other Topics Concern  . Not on file  Social History Narrative  . Not on file   Additional Social History:                         Sleep: Fair  Appetite:  Fair  Current Medications: Current Facility-Administered Medications  Medication Dose Route Frequency Provider Last Rate Last Dose  . acetaminophen (TYLENOL) tablet 650 mg  650 mg Oral Q6H PRN [name], MD   650 mg at 10/26/18 2302  . alum & mag hydroxide-simeth (MAALOX/MYLANTA) 200-200-20 MG/5ML suspension 30 mL  30 mL Oral Q4H PRN [name], MD   30 mL at 10/26/18 2129  . ARIPiprazole (ABILIFY) tablet 15 mg  15 mg Oral Daily Ohanna Gassert, [name], MD   15  mg at 12/02/18 0803  . divalproex (DEPAKOTE ER) 24 hr tablet 1,000 mg  1,000 mg Oral QHS Devarion Mcclanahan T, MD   1,000 mg at 12/01/18 2105  . hydrOXYzine (ATARAX/VISTARIL) tablet 50 mg  50 mg Oral Q4H PRN Terance HartStephens, [name], MD   50 mg at 10/28/18 [id number]  . magnesium hydroxide (MILK OF MAGNESIA) suspension 30 mL  30 mL Oral Daily PRN Terance HartStephens, [name], MD      . OLANZapine [facility](ZYPREXA) injection 10 mg  10 mg Intramuscular Q6H PRN Delight Bickle T, MD      . OLANZapine (ZYPREXA) tablet 40 mg  40 mg Oral QHS Gwyndolyn Guilford, Jackquline DenmarkJohn T, MD   40 mg at 12/01/18 2105  . traZODone (DESYREL) tablet 100 mg  100 mg Oral QHS PRN Terance HartStephens, [name], MD   100 mg at 11/26/18 2123    Lab Results: No results found for this or any previous visit (from the past 48 hour(s)).  Blood Alcohol level:  Lab Results  Component Value Date    ETH <10 10/16/2018    Metabolic Disorder Labs: Lab Results  Component Value Date   HGBA1C 5.2 10/23/2018   MPG 102.54 10/23/2018   No results found for: PROLACTIN Lab Results  Component Value Date   CHOL 129 10/23/2018   TRIG 58 10/23/2018   HDL 50 10/23/2018   CHOLHDL 2.6 10/23/2018   VLDL 12 10/23/2018   LDLCALC 67 10/23/2018    Physical Findings: AIMS: Facial and Oral Movements Muscles of Facial Expression: None, normal Lips and Perioral Area: None, normal Jaw: None, normal Tongue: None, normal,Extremity Movements Upper (arms, wrists, hands, fingers): None, normal Lower (legs, knees, ankles, toes): None, normal, Trunk Movements Neck, shoulders, hips: None, normal, Overall Severity Severity of abnormal movements (highest score from questions above): None, normal Incapacitation due to abnormal movements: None, normal Patient's awareness of abnormal movements (rate only patient's report): No Awareness, Dental Status Current problems with teeth and/or dentures?: No Does patient usually wear dentures?: No  CIWA:  CIWA-Ar Total: 0 COWS:  COWS Total Score: 0  Musculoskeletal: Strength & Muscle Tone: within normal limits Gait & Station: normal Patient leans: N/A  Psychiatric Specialty Exam: Physical Exam  Nursing note and vitals reviewed. Constitutional: He appears well-developed and well-nourished.  HENT:  Head: Normocephalic and atraumatic.  Eyes: Pupils are equal, round, and reactive to light. Conjunctivae are normal.  Neck: Normal range of motion.  Cardiovascular: Regular rhythm and normal heart sounds.  Respiratory: Effort normal. No respiratory distress.  GI: Soft.  Musculoskeletal: Normal range of motion.  Neurological: He is alert.  Skin: Skin is warm and dry.  Psychiatric: He has a normal mood and affect. His speech is normal. He is agitated. He is not aggressive. Thought content is delusional. Thought content is not paranoid. Cognition and memory are  normal. He expresses impulsivity. He expresses no homicidal and no suicidal ideation.    Review of Systems  Constitutional: Negative.   HENT: Negative.   Eyes: Negative.   Respiratory: Negative.   Cardiovascular: Negative.   Gastrointestinal: Negative.   Musculoskeletal: Negative.   Skin: Negative.   Neurological: Negative.   Psychiatric/Behavioral: Negative.     Blood pressure 127/61, pulse 88, temperature 97.6 F (36.4 C), temperature source Oral, resp. rate 18, height 5\' 11"  (1.803 m), weight 88.5 kg, SpO2 100 %.Body mass index is 27.2 kg/m.  General Appearance: Casual  Eye Contact:  Good  Speech:  Clear and Coherent  Volume:  Normal  Mood:  Euthymic  Affect:  Congruent  Thought Process:  Goal Directed  Orientation:  Full (Time, Place, and Person)  Thought Content:  Logical  Suicidal Thoughts:  No  Homicidal Thoughts:  No  Memory:  Immediate;   Fair Recent;   Fair Remote;   Fair  Judgement:  Fair  Insight:  Fair  Psychomotor Activity:  Normal  Concentration:  Concentration: Fair  Recall:  [employer] of Knowledge:  Fair  Language:  Fair  Akathisia:  No  Handed:  Right  AIMS (if indicated):     Assets:  Desire for Improvement Physical Health  ADL's:  Intact  Cognition:  WNL  Sleep:  Number of Hours: 4.5     Treatment Plan Summary: Daily contact with patient to assess and evaluate symptoms and progress in treatment, Medication management and Plan Honestly he is really doing pretty well.  I would probably be fine with getting him into a group home if we could find one that was open and would take him.  Otherwise we do not have any kind of discharge plan in place other than waiting for [facility].  Supportive counseling no change to medicine.  Mordecai Rasmussen, MD 12/02/2018, 2:58 PM

## 2018-12-02 NOTE — Plan of Care (Signed)
Pt states he "slept well." Pt  denies SI, HI, AVH, depression and anxiety. Pt has a goal to write some letters today. Pt has been educated on care plan and verbalizes understanding. Torrie Mayers RN Problem: Education: Goal: Knowledge of [place] General Education information/materials will improve Outcome: Progressing   Problem: Coping: Goal: Ability to verbalize frustrations and anger appropriately will improve Outcome: Progressing Goal: Ability to demonstrate self-control will improve Outcome: Progressing   Problem: Safety: Goal: Periods of time without injury will increase Outcome: Progressing   Problem: Education: Goal: Will be free of psychotic symptoms Outcome: Progressing Goal: Knowledge of the prescribed therapeutic regimen will improve Outcome: Progressing   Problem: Self-Concept: Goal: Will verbalize positive feelings about self Outcome: Progressing   Problem: Education: Goal: Ability to state activities that reduce stress will improve Outcome: Progressing

## 2018-12-02 NOTE — BHH Counselor (Signed)
CSW spoke with Jackson [name] to confirm that the patient remained on the waitlist for [facility].    CSW asked if it would be helpful for [facility] to speak with mother and assess her concerns in an effort to continue to move the patient list and was informed that it was not.    CSW asked if she could know where the patient was on the list and was informed that patient is "3-4 people behind the priority list".  CSW was informed that aggressive and violent behavior and those individuals coming from jail/prisons take priority over the patient.  CSW was also informed that [employer] serves over 20 counties in their catchment area.    Penni Homans, MSW, LCSW 12/02/2018 11:30 AM

## 2018-12-02 NOTE — Progress Notes (Signed)
Recreation Therapy Notes  Date: 12/02/2018  Time: 9:30 am   Location: Craft room   Behavioral response: N/A   Intervention Topic: Leisure  Discussion/Intervention: Patient did not attend group.   Clinical Observations/Feedback:  Patient did not attend group.   Clell Trahan LRT/CTRS        Bertin Inabinet 12/02/2018 11:52 AM

## 2018-12-02 NOTE — B[facility] Group Notes (Signed)
Balance In Life 12/02/2018 1PM  Type of Therapy/Topic:  Group Therapy:  Balance in Life  Participation Level:  Did Not Attend  Description of Group:   This group will address the concept of balance and how it feels and looks when one is unbalanced. Patients will be encouraged to process areas in their lives that are out of balance and identify reasons for remaining unbalanced. Facilitators will guide patients in utilizing problem-solving interventions to address and correct the stressor making their life unbalanced. Understanding and applying boundaries will be explored and addressed for obtaining and maintaining a balanced life. Patients will be encouraged to explore ways to assertively make their unbalanced needs known to significant others in their lives, using other group members and facilitator for support and feedback.  Therapeutic Goals: 1. Patient will identify two or more emotions or situations they have that consume much of in their lives. 2. Patient will identify signs/triggers that life has become out of balance:  3. Patient will identify two ways to set boundaries in order to achieve balance in their lives:  4. Patient will demonstrate ability to communicate their needs through discussion and/or role plays  Summary of Patient Progress:    Therapeutic Modalities:   Cognitive Behavioral Therapy Solution-Focused Therapy Assertiveness Training  Sidni Fusco T Chrishana Spargur, LCSW  

## 2018-12-02 NOTE — B[facility] Group Notes (Signed)
[facility] Group Notes:  (Nursing/MHT/Case Management/Adjunct)  Date:  12/02/2018  Time:  8:56 PM  Type of Therapy:  Group Therapy  Participation Level:  Did Not Attend   [name] 12/02/2018, 8:56 PM

## 2018-12-02 NOTE — Progress Notes (Signed)
Patient continues to maintain appropriate behaviors and maintaining adequate safety guide lines, coping nicely, reading and writing in a desk in his room, mood is good, responding logically to information provided.received his PM medications with out any issues , voice no physical concerns, denies any suicidal , or homicidal ideations and no active delusions, noted. At this timd, patient socializes when is possible for him due personal isolation in his room, patient is safe in the unit, sleep is intermittent and only requiring 15 minutes  visual  Checks. No distress

## 2018-12-03 NOTE — [facility] Group Notes (Signed)
LCSW Group Therapy Note  12/03/2018 12:15 PM  Type of Therapy and Topic:  Group Therapy:  Feelings around Relapse and Recovery  Participation Level:  Did Not Attend   Description of Group:    Patients in this group will discuss emotions they experience before and after a relapse. They will process how experiencing these feelings, or avoidance of experiencing them, relates to having a relapse. Facilitator will guide patients to explore emotions they have related to recovery. Patients will be encouraged to process which emotions are more powerful. They will be guided to discuss the emotional reaction significant others in their lives may have to their relapse or recovery. Patients will be assisted in exploring ways to respond to the emotions of others without this contributing to a relapse.  Therapeutic Goals: 1. Patient will identify two or more emotions that lead to a relapse for them 2. Patient will identify two emotions that result when they relapse 3. Patient will identify two emotions related to recovery 4. Patient will demonstrate ability to communicate their needs through discussion and/or role plays   Summary of Patient Progress: x    Therapeutic Modalities:   Cognitive Behavioral Therapy Solution-Focused Therapy Assertiveness Training Relapse Prevention Therapy   Iris Pert, MSW, LCSW Clinical Social Work 12/03/2018 12:15 PM

## 2018-12-03 NOTE — Progress Notes (Signed)
[facility] MD Progress Note  12/03/2018 3:30 PM Jackson [name]  MRN:  [id number] Subjective: Follow-up for this gentleman with schizoaffective disorder.  Patient has no specific complaints today.  He continues to appear to be doing pretty well.  He is not agitated not aggressive not hostile and he is able to carry on a lucid conversation.  He keeps himself busy trying to pursue the kind of paperwork he would need to apply for a guardianship hearing.  He does attend some groups and interacts with others fine.  No physical complaints. Principal Problem: Schizoaffective disorder, bipolar type (HCC) Diagnosis: Principal Problem:   Schizoaffective disorder, bipolar type (HCC) Active Problems:   Noncompliance  Total Time spent with patient: 15 minutes  Past Psychiatric History: Patient has a history of bipolar disorder or schizoaffective disorder and has required lengthy hospitalization in the past  Past Medical History:  Past Medical History:  Diagnosis Date  . Bipolar 1 disorder (HCC)   . Schizoaffective disorder (HCC)    History reviewed. No pertinent surgical history. Family History: History reviewed. No pertinent family history. Family Psychiatric  History: None known Social History:  Social History   Substance and Sexual Activity  Alcohol Use Not on file     Social History   Substance and Sexual Activity  Drug Use Not on file    Social History   Socioeconomic History  . Marital status: Single    Spouse name: Not on file  . Number of children: Not on file  . Years of education: Not on file  . Highest education level: Not on file  Occupational History  . Not on file  Social Needs  . Financial resource strain: Not on file  . Food insecurity:    Worry: Not on file    Inability: Not on file  . Transportation needs:    Medical: Not on file    Non-medical: Not on file  Tobacco Use  . Smoking status: Light Tobacco Smoker    Packs/day: 0.25    Types: Cigarettes  . Smokeless  tobacco: Never Used  Substance and Sexual Activity  . Alcohol use: Not on file  . Drug use: Not on file  . Sexual activity: Not on file  Lifestyle  . Physical activity:    Days per week: Not on file    Minutes per session: Not on file  . Stress: Not on file  Relationships  . Social connections:    Talks on phone: Not on file    Gets together: Not on file    Attends religious service: Not on file    Active member of club or organization: Not on file    Attends meetings of clubs or organizations: Not on file    Relationship status: Not on file  Other Topics Concern  . Not on file  Social History Narrative  . Not on file   Additional Social History:                         Sleep: Fair  Appetite:  Fair  Current Medications: Current Facility-Administered Medications  Medication Dose Route Frequency Provider Last Rate Last Dose  . acetaminophen (TYLENOL) tablet 650 mg  650 mg Oral Q6H PRN [name], MD   650 mg at 10/26/18 2302  . alum & mag hydroxide-simeth (MAALOX/MYLANTA) 200-200-20 MG/5ML suspension 30 mL  30 mL Oral Q4H PRN [name], MD   30 mL at 10/26/18 2129  . ARIPiprazole (  ABILIFY) tablet 15 mg  15 mg Oral Daily Venita Seng, Jackquline DenmarkJohn T, MD   15 mg at 12/03/18 0809  . divalproex (DEPAKOTE ER) 24 hr tablet 1,000 mg  1,000 mg Oral QHS Ladarius Seubert T, MD   1,000 mg at 12/02/18 2145  . hydrOXYzine (ATARAX/VISTARIL) tablet 50 mg  50 mg Oral Q4H PRN Terance HartStephens, [name], MD   50 mg at 10/28/18 [id number]  . magnesium hydroxide (MILK OF MAGNESIA) suspension 30 mL  30 mL Oral Daily PRN Terance HartStephens, [name], MD      . OLANZapine [facility](ZYPREXA) injection 10 mg  10 mg Intramuscular Q6H PRN Tawney Vanorman T, MD      . OLANZapine (ZYPREXA) tablet 40 mg  40 mg Oral QHS Ahlam Piscitelli, Jackquline DenmarkJohn T, MD   40 mg at 12/02/18 2145  . traZODone (DESYREL) tablet 100 mg  100 mg Oral QHS PRN Terance HartStephens, [name], MD   100 mg at 11/26/18 2123    Lab Results: No results found for this or any  previous visit (from the past 48 hour(s)).  Blood Alcohol level:  Lab Results  Component Value Date   ETH <10 10/16/2018    Metabolic Disorder Labs: Lab Results  Component Value Date   HGBA1C 5.2 10/23/2018   MPG 102.54 10/23/2018   No results found for: PROLACTIN Lab Results  Component Value Date   CHOL 129 10/23/2018   TRIG 58 10/23/2018   HDL 50 10/23/2018   CHOLHDL 2.6 10/23/2018   VLDL 12 10/23/2018   LDLCALC 67 10/23/2018    Physical Findings: AIMS: Facial and Oral Movements Muscles of Facial Expression: None, normal Lips and Perioral Area: None, normal Jaw: None, normal Tongue: None, normal,Extremity Movements Upper (arms, wrists, hands, fingers): None, normal Lower (legs, knees, ankles, toes): None, normal, Trunk Movements Neck, shoulders, hips: None, normal, Overall Severity Severity of abnormal movements (highest score from questions above): None, normal Incapacitation due to abnormal movements: None, normal Patient's awareness of abnormal movements (rate only patient's report): No Awareness, Dental Status Current problems with teeth and/or dentures?: No Does patient usually wear dentures?: No  CIWA:  CIWA-Ar Total: 0 COWS:  COWS Total Score: 0  Musculoskeletal: Strength & Muscle Tone: within normal limits Gait & Station: normal Patient leans: N/A  Psychiatric Specialty Exam: Physical Exam  Nursing note and vitals reviewed. Constitutional: He appears well-developed and well-nourished.  HENT:  Head: Normocephalic and atraumatic.  Eyes: Pupils are equal, round, and reactive to light. Conjunctivae are normal.  Neck: Normal range of motion.  Cardiovascular: Normal heart sounds.  Respiratory: Effort normal.  GI: Soft.  Musculoskeletal: Normal range of motion.  Neurological: He is alert.  Skin: Skin is warm and dry.  Psychiatric: He has a normal mood and affect. His behavior is normal. Judgment and thought content normal.    Review of Systems   Constitutional: Negative.   HENT: Negative.   Eyes: Negative.   Respiratory: Negative.   Cardiovascular: Negative.   Gastrointestinal: Negative.   Musculoskeletal: Negative.   Skin: Negative.   Neurological: Negative.   Psychiatric/Behavioral: Negative.     Blood pressure 104/62, pulse 71, temperature 97.7 F (36.5 C), temperature source Oral, resp. rate 18, height 5\' 11"  (1.803 m), weight 88.5 kg, SpO2 99 %.Body mass index is 27.2 kg/m.  General Appearance: Casual  Eye Contact:  Good  Speech:  Clear and Coherent  Volume:  Normal  Mood:  Euthymic  Affect:  Constricted  Thought Process:  Goal Directed  Orientation:  Full (Time, Place,  and Person)  Thought Content:  Logical  Suicidal Thoughts:  No  Homicidal Thoughts:  No  Memory:  Immediate;   Fair Recent;   Fair Remote;   Fair  Judgement:  Fair  Insight:  Fair  Psychomotor Activity:  Normal  Concentration:  Concentration: Fair  Recall:  [employer] of Knowledge:  Fair  Language:  Fair  Akathisia:  No  Handed:  Right  AIMS (if indicated):     Assets:  Communication Skills Desire for Improvement Financial Resources/Insurance Physical Health Resilience Social Support  ADL's:  Intact  Cognition:  WNL  Sleep:  Number of Hours: 5.15     Treatment Plan Summary: Daily contact with patient to assess and evaluate symptoms and progress in treatment, Medication management and Plan Overall the patient is doing much better.  I am not sure if this is his best possible baseline but he is doing quite well.  Medications have seem to be at appropriate levels.  We are stuck with having him still in the hospital for now however because his mother had strongly wished him to go to [facility].  Meantime the virus situation has made group homes almost impossible to come by.  Patient has been very patient with the whole process.  No plan for anything specific to change over the weekend.  Mordecai Rasmussen, MD 12/03/2018, 3:30  PM

## 2018-12-03 NOTE — B[facility] Group Notes (Signed)
[facility] Group Notes:  (Nursing/MHT/Case Management/Adjunct)  Date:  12/03/2018  Time:  9:41 AM  Type of Therapy:  Psychoeducational Skills  Participation Level:  Did Not Attend   Summary of Progress/Problems:  Jackson [name] 12/03/2018, 9:41 AM

## 2018-12-03 NOTE — Progress Notes (Signed)
Patient alert and oriented x 4,denies SI/HI/AVH no disruptive behavior during this shift, affect is blunted, he is interacting with staff and peers appropriately.  Patient's thoughts are organized and coherent no bizarre behavior and he is receptive to staff. Patient was complaint with medication. Patient did not attend evening wrap up group, 15 minutes safety checks maintained will continue to monitor.

## 2018-12-03 NOTE — Tx Team (Signed)
Interdisciplinary Treatment and Diagnostic Plan Update  12/03/2018 Time of Session: 8:30AM  Jackson [name] MRN: [id number]  Principal Diagnosis: Schizoaffective disorder, bipolar type [facility](HCC)  Secondary Diagnoses: Principal Problem:   Schizoaffective disorder, bipolar type (HCC) Active Problems:   Noncompliance   Current Medications:  Current Facility-Administered Medications  Medication Dose Route Frequency Provider Last Rate Last Dose  . acetaminophen (TYLENOL) tablet 650 mg  650 mg Oral Q6H PRN Terance HartStephens, [name], MD   650 mg at 10/26/18 2302  . alum & mag hydroxide-simeth (MAALOX/MYLANTA) 200-200-20 MG/5ML suspension 30 mL  30 mL Oral Q4H PRN Terance HartStephens, [name], MD   30 mL at 10/26/18 2129  . ARIPiprazole (ABILIFY) tablet 15 mg  15 mg Oral Daily [name], Jackquline DenmarkJohn T, MD   15 mg at 12/03/18 0809  . divalproex (DEPAKOTE ER) 24 hr tablet 1,000 mg  1,000 mg Oral QHS [name], [name], MD   1,000 mg at 12/02/18 2145  . hydrOXYzine (ATARAX/VISTARIL) tablet 50 mg  50 mg Oral Q4H PRN Terance HartStephens, [name], MD   50 mg at 10/28/18 [id number]  . magnesium hydroxide (MILK OF MAGNESIA) suspension 30 mL  30 mL Oral Daily PRN Terance HartStephens, [name], MD      . OLANZapine [facility](ZYPREXA) injection 10 mg  10 mg Intramuscular Q6H PRN [name], [name], MD      . OLANZapine (ZYPREXA) tablet 40 mg  40 mg Oral QHS [name], Jackquline DenmarkJohn T, MD   40 mg at 12/02/18 2145  . traZODone (DESYREL) tablet 100 mg  100 mg Oral QHS PRN Terance HartStephens, [name], MD   100 mg at 11/26/18 2123   PTA Medications: No medications prior to admission.    Patient Stressors: Health problems Medication change or noncompliance  Patient Strengths: Manufacturing systems engineerCommunication skills Religious Affiliation Supportive family/friends  Treatment Modalities: Medication Management, Group therapy, Case management,  1 to 1 session with clinician, Psychoeducation, Recreational therapy.   Physician Treatment Plan for Primary Diagnosis: Schizoaffective disorder, bipolar type  (HCC) Long Term Goal(s): Improvement in symptoms so as ready for discharge Improvement in symptoms so as ready for discharge   Short Term Goals: Ability to demonstrate self-control will improve Ability to identify and develop effective coping behaviors will improve Compliance with prescribed medications will improve  Medication Management: Evaluate patient's response, side effects, and tolerance of medication regimen.  Therapeutic Interventions: 1 to 1 Donahue, Unit Group Jeter and Medication administration.  Evaluation of Outcomes: Progressing  Physician Treatment Plan for Secondary Diagnosis: Principal Problem:   Schizoaffective disorder, bipolar type (HCC) Active Problems:   Noncompliance  Long Term Goal(s): Improvement in symptoms so as ready for discharge Improvement in symptoms so as ready for discharge   Short Term Goals: Ability to demonstrate self-control will improve Ability to identify and develop effective coping behaviors will improve Compliance with prescribed medications will improve     Medication Management: Evaluate patient's response, side effects, and tolerance of medication regimen.  Therapeutic Interventions: 1 to 1 Chianese, Unit Group Taubman and Medication administration.  Evaluation of Outcomes: Progressing   RN Treatment Plan for Primary Diagnosis: Schizoaffective disorder, bipolar type (HCC) Long Term Goal(s): Knowledge of disease and therapeutic regimen to maintain health will improve  Short Term Goals: Ability to verbalize frustration and anger appropriately will improve, Ability to demonstrate self-control, Ability to participate in decision making will improve and Ability to verbalize feelings will improve  Medication Management: RN will administer medications as ordered by provider, will assess and evaluate patient's response and provide education to patient  for prescribed medication. RN will report any adverse and/or side effects to  prescribing provider.  Therapeutic Interventions: 1 on 1 counseling Henshaw, Psychoeducation, Medication administration, Evaluate responses to treatment, Monitor vital signs and CBGs as ordered, Perform/monitor CIWA, COWS, AIMS and Fall Risk screenings as ordered, Perform wound care treatments as ordered.  Evaluation of Outcomes: Progressing   LCSW Treatment Plan for Primary Diagnosis: Schizoaffective disorder, bipolar type (HCC) Long Term Goal(s): Safe transition to appropriate next level of care at discharge, Engage patient in therapeutic group addressing interpersonal concerns.  Short Term Goals: Engage patient in aftercare planning with referrals and resources, Increase social support, Increase ability to appropriately verbalize feelings, Increase emotional regulation and Facilitate acceptance of mental health diagnosis and concerns  Therapeutic Interventions: Assess for all discharge needs, 1 to 1 time with Social worker, Explore available resources and support systems, Assess for adequacy in community support network, Educate family and significant other(s) on suicide prevention, Complete Psychosocial Assessment, Interpersonal group therapy.  Evaluation of Outcomes: Progressing   Progress in Treatment: Attending groups: No. Participating in groups: No. Taking medication as prescribed: Yes.   Toleration medication: Yes. Family/Significant other contact made: Yes, individual(s) contacted:  CSW completed SPE with the patients mother.  Patient understands diagnosis: Yes. Discussing patient identified problems/goals with staff: Yes. Medical problems stabilized or resolved: Yes. Denies suicidal/homicidal ideation: Yes. Issues/concerns per patient self-inventory: No. Other: none  New problem(s) identified: No, Describe:  none  New Short Term/Long Term Goal(s): elimination of symptoms of psychosis, medication management for mood stabilization; elimination of SI thoughts; development of  comprehensive mental wellness plan.  Patient Goals:  "independence"  Discharge Plan or Barriers: Pt mother is his legal guardian.  Mother continues to decline that the patient be placed in a group home, despite CSW pointing that pt's behaviors have decreased significantly at this time.   Pt remains on the waitlist for [facility].  Patient has requested the number for Dept of Social Services so that he may file a formal complaint against his mother as his guardian.    Reason for Continuation of Hospitalization: Aggression Delusions  Hallucinations Mania Medication stabilization  Estimated Length of Stay: TBD  Recreational Therapy: Patient Stressors: N/A Patient Goal: Patient will focus on task/topic with 2 prompts from staff within 5 recreation therapy group Bitting  Attendees: Patient:  12/03/2018 9:38 AM  Physician: Dr. Toni Amend, MD 12/03/2018 9:38 AM  Nursing:  12/03/2018 9:38 AM  RN Care Manager: 12/03/2018 9:38 AM  Social Worker: Penni Homans, LCSW 12/03/2018 9:38 AM  Recreational Therapist:  12/03/2018 9:38 AM  Other:  12/03/2018 9:38 AM  Other:  12/03/2018 9:38 AM  Other: 12/03/2018 9:38 AM    Scribe for Treatment Team: Harden Mo, LCSW 12/03/2018 9:38 AM

## 2018-12-03 NOTE — Plan of Care (Signed)
Pt stated he slept "well". Pt denies SI, HI and AVH. 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 PT was educated on care plan and verbalizes understanding. Torrie Mayers RN Problem: Education: Goal: Knowledge of [place] [place] [facility] Education information/materials will improve Outcome: Progressing   Problem: Coping: Goal: Ability to verbalize frustrations and anger appropriately will improve Outcome: Progressing Goal: Ability to demonstrate self-control will improve Outcome: Progressing   Problem: Safety: Goal: Periods of time without injury will increase Outcome: Progressing   Problem: Education: Goal: Will be free of psychotic symptoms Outcome: Progressing Goal: Knowledge of the prescribed therapeutic regimen will improve Outcome: Progressing   Problem: Self-Concept: Goal: Will verbalize positive feelings about self Outcome: Progressing   Problem: Education: Goal: Ability to state activities that reduce stress will improve Outcome: Progressing

## 2018-12-03 NOTE — Progress Notes (Signed)
Recreation Therapy Notes  Date: 12/03/2018  Time: 9:30 am   Location: Craft room   Behavioral response: N/A   Intervention Topic: Relaxation  Discussion/Intervention: Patient did not attend group.   Clinical Observations/Feedback:  Patient did not attend group.   Tiffany Calmes LRT/CTRS        Tasheika Kitzmiller 12/03/2018 10:33 AM

## 2018-12-03 NOTE — Plan of Care (Addendum)
  Problem: Education: Goal: Will be free of psychotic symptoms Outcome: Progressing  Patient appears free of psychosis.  

## 2018-12-04 NOTE — Progress Notes (Signed)
Patient alert and oriented x 4, denies SI/HI/AVH, affected is blunted, he is not aggressive  or hostile towards staff, no disruptive behaviorduring this shift, heisinteractingwith staff and peersappropriately. Patient's thoughts are organized and coherent no bizarre behavior and he is receptive to staff. Patient was complaint with medication. 15 minutes safety checks maintained will continue to monitor.

## 2018-12-04 NOTE — [facility] Group Notes (Signed)
LCSW Group Therapy Note  12/04/2018 1:15pm  Type of Therapy and Topic: Group Therapy: Holding on to Grudges   Participation Level: Did Not Attend   Description of Group:  In this group patients will be asked to explore and define a grudge. Patients will be guided to discuss their thoughts, feelings, and reasons as to why people have grudges. Patients will process the impact grudges have on daily life and identify thoughts and feelings related to holding grudges. Facilitator will challenge patients to identify ways to let go of grudges and the benefits this provides. Patients will be confronted to address why one struggles letting go of grudges. Lastly, patients will identify feelings and thoughts related to what life would look like without grudges. This group will be process-oriented, with patients participating in exploration of their own experiences, giving and receiving support, and processing challenge from other group members.  Therapeutic Goals:  1. Patient will identify specific grudges related to their personal life.  2. Patient will identify feelings, thoughts, and beliefs around grudges.  3. Patient will identify how one releases grudges appropriately.  4. Patient will identify situations where they could have let go of the grudge, but instead chose to hold on.   Summary of Patient Progress: Pt was invited to attend group but chose not to attend. CSW will continue to encourage pt to attend group throughout their admission.    Therapeutic Modalities:  Cognitive Behavioral Therapy  Solution Focused Therapy  Motivational Interviewing  Brief Therapy   Jettie Lazare  [name], LCSW 12/04/2018 12:30 PM  

## 2018-12-04 NOTE — Progress Notes (Signed)
[facility] MD Progress Note  12/04/2018 12:43 PM Jackson [name]  MRN:  [id number] Subjective: Follow-up for this gentleman with schizoaffective disorder.  Jackson [name] has been here since 10/17/18.   Pt seen and chart reviewed.  Jackson [name] reports things are going well, and has no complaints.   Jackson [name] keeps himself busy trying to pursue the kind of paperwork Jackson [name] would need to apply for a guardianship hearing.  It was neatly written, organized spread on a bed.   Principal Problem: Schizoaffective disorder, bipolar type (HCC) Diagnosis: Principal Problem:   Schizoaffective disorder, bipolar type (HCC) Active Problems:   Noncompliance  Total Time spent with patient: 15 minutes  Past Psychiatric History: Patient has a history of bipolar disorder or schizoaffective disorder and has required lengthy hospitalization in the past  Past Medical History:  Past Medical History:  Diagnosis Date  . Bipolar 1 disorder (HCC)   . Schizoaffective disorder (HCC)    History reviewed. No pertinent surgical history. Family History: History reviewed. No pertinent family history. Family Psychiatric  History: None known Social History:  Social History   Substance and Sexual Activity  Alcohol Use Not on file     Social History   Substance and Sexual Activity  Drug Use Not on file    Social History   Socioeconomic History  . Marital status: Single    Spouse name: Not on file  . Number of children: Not on file  . Years of education: Not on file  . Highest education level: Not on file  Occupational History  . Not on file  Social Needs  . Financial resource strain: Not on file  . Food insecurity:    Worry: Not on file    Inability: Not on file  . Transportation needs:    Medical: Not on file    Non-medical: Not on file  Tobacco Use  . Smoking status: Light Tobacco Smoker    Packs/day: 0.25    Types: Cigarettes  . Smokeless tobacco: Never Used  Substance and Sexual Activity  . Alcohol use: Not on file  . Drug  use: Not on file  . Sexual activity: Not on file  Lifestyle  . Physical activity:    Days per week: Not on file    Minutes per session: Not on file  . Stress: Not on file  Relationships  . Social connections:    Talks on phone: Not on file    Gets together: Not on file    Attends religious service: Not on file    Active member of club or organization: Not on file    Attends meetings of clubs or organizations: Not on file    Relationship status: Not on file  Other Topics Concern  . Not on file  Social History Narrative  . Not on file   Additional Social History:   Sleep: Fair  Appetite:  Fair  Current Medications: Current Facility-Administered Medications  Medication Dose Route Frequency Provider Last Rate Last Dose  . acetaminophen (TYLENOL) tablet 650 mg  650 mg Oral Q6H PRN [name], MD   650 mg at 10/26/18 2302  . alum & mag hydroxide-simeth (MAALOX/MYLANTA) 200-200-20 MG/5ML suspension 30 mL  30 mL Oral Q4H PRN [name], MD   30 mL at 10/26/18 2129  . ARIPiprazole (ABILIFY) tablet 15 mg  15 mg Oral Daily [name], [name], MD   15 mg at 12/04/18 0749  . divalproex (DEPAKOTE ER) 24 hr tablet 1,000 mg  1,000 mg Oral QHS  [name], [name], MD   1,000 mg at 12/03/18 2156  . hydrOXYzine (ATARAX/VISTARIL) tablet 50 mg  50 mg Oral Q4H PRN [name], MD   50 mg at 10/28/18 3875  . magnesium hydroxide (MILK OF MAGNESIA) suspension 30 mL  30 mL Oral Daily PRN [name], MD      . OLANZapine [facility]) injection 10 mg  10 mg Intramuscular Q6H PRN [name], [name], MD      . OLANZapine (ZYPREXA) tablet 40 mg  40 mg Oral QHS [name], [name], MD   40 mg at 12/03/18 2156  . traZODone (DESYREL) tablet 100 mg  100 mg Oral QHS PRN [name], MD   100 mg at 11/26/18 2123    Lab Results: No results found for this or any previous visit (from the past 48 hour(s)).  Blood Alcohol level:  Lab Results  Component Value Date   ETH <10 10/16/2018     Metabolic Disorder Labs: Lab Results  Component Value Date   HGBA1C 5.2 10/23/2018   MPG 102.54 10/23/2018   No results found for: PROLACTIN Lab Results  Component Value Date   CHOL 129 10/23/2018   TRIG 58 10/23/2018   HDL 50 10/23/2018   CHOLHDL 2.6 10/23/2018   VLDL 12 10/23/2018   LDLCALC 67 10/23/2018    Physical Findings: AIMS: Facial and Oral Movements Muscles of Facial Expression: None, normal Lips and Perioral Area: None, normal Jaw: None, normal Tongue: None, normal,Extremity Movements Upper (arms, wrists, hands, fingers): None, normal Lower (legs, knees, ankles, toes): None, normal, Trunk Movements Neck, shoulders, hips: None, normal, Overall Severity Severity of abnormal movements (highest score from questions above): None, normal Incapacitation due to abnormal movements: None, normal Patient's awareness of abnormal movements (rate only patient's report): No Awareness, Dental Status Current problems with teeth and/or dentures?: No Does patient usually wear dentures?: No  CIWA:  CIWA-Ar Total: 0 COWS:  COWS Total Score: 0  Musculoskeletal: Strength & Muscle Tone: within normal limits Gait & Station: normal Patient leans: N/A  Psychiatric Specialty Exam: Physical Exam  Nursing note and vitals reviewed. Constitutional: Jackson [name] appears well-developed and well-nourished.  HENT:  Head: Normocephalic and atraumatic.  Eyes: Pupils are equal, round, and reactive to light. Conjunctivae are normal.  Neck: Normal range of motion.  Cardiovascular: Normal heart sounds.  Respiratory: Effort normal.  GI: Soft.  Musculoskeletal: Normal range of motion.  Neurological: Jackson [name] is alert.  Skin: Skin is warm and dry.  Psychiatric: Jackson [name] has a normal mood and affect. His behavior is normal. Judgment and thought content normal.    Review of Systems  Constitutional: Negative.   HENT: Negative.   Eyes: Negative.   Respiratory: Negative.   Cardiovascular: Negative.    Gastrointestinal: Negative.   Musculoskeletal: Negative.   Skin: Negative.   Neurological: Negative.   Psychiatric/Behavioral: Negative.     Blood pressure 101/64, pulse 72, temperature 98.4 F (36.9 C), temperature source Oral, resp. rate 18, height  (1.803 m), weight 88.5 kg, SpO2 99 %.Body mass index is 27.2 kg/m.  General Appearance: Casual  Eye Contact:  Good  Speech:  Clear and Coherent  Volume:  Normal  Mood:  Euthymic  Affect:  Constricted  Thought Process:  Goal Directed  Orientation:  Full (Time, Place, and Person)  Thought Content:  Logical  Suicidal Thoughts:  No  Homicidal Thoughts:  No  Memory:  Immediate;   Fair Recent;   Fair Remote;   Fair  Judgement:  Fair  Insight:  Fair  Psychomotor Activity:  Normal  Concentration:  Concentration: Fair  Recall:  Jackson [name]  Fund of Knowledge:  Fair  Language:  Fair  Akathisia:  No  Handed:  Right  AIMS (if indicated):     Assets:  Communication Skills Desire for Improvement Financial Resources/Insurance Physical Health Resilience Social Support  ADL's:  Intact  Cognition:  WNL  Sleep:  Number of Hours: 4     Treatment Plan Summary: Daily contact with patient to assess and evaluate symptoms and progress in treatment and Medication management   Plan  # Schizophrenia -- continue Abilify 15mg  daily.  -- continue zyprexa 40mg  qhs -- continue Depakote ER 1000mg  qhs.  -- continue Trazodone 100mg  qhs prn.   # Disposition -- [facility] or [facility].  -- apply for guardianship?     Jackson Burbach, MD 12/04/2018, 12:43 PM

## 2018-12-04 NOTE — Progress Notes (Signed)
D: Patient stated slept good last night .Stated appetite  good and energy level  Is normal. Stated concentration good . Stated no  Depression, hopeless or anxiety .Denies suicidal  homicidal ideations  .Denies  auditory hallucinations  No pain concerns . No ADL'S  This shift.. Interacting with peers and staff. Patient aware of  information  given relating to [facility] education , patient  able to verbalize  understanding .  No anger outburst for past 24 hours . Able to maintained  self control . Voice of no safety concerns. Thought process improving. Compliant  with medication. Patient able to verbalize positive feeling of self .  Marland Kitchen Patient remains delusional , with magical thinking . Isolates to room , picks and chooses groups participations .  A: Encourage patient participation with unit programming . Instruction  Given on  Medication , verbalize understanding.  R: Voice no other concerns. Staff continue to monitor

## 2018-12-04 NOTE — Plan of Care (Signed)
  Problem: Education: Goal: Will be free of psychotic symptoms Outcome: Progressing  Patient appears free of psychosis.  

## 2018-12-04 NOTE — Plan of Care (Signed)
Patient aware of  information  given relating to [facility] education , patient  able to verbalize  understanding .  No anger outburst for past 24 hours . Able to maintained  self control . Voice of no safety concerns. Thought process improving. Compliant  with medication. Patient able to verbalize positive feeling of self .  Marland Kitchen Patient remains delusional , with magical thinking . Isolates to room , picks and chooses groups participations .   Problem: Education: Goal: Knowledge of [facility] Education information/materials will improve Outcome: Progressing   Problem: Coping: Goal: Ability to verbalize frustrations and anger appropriately will improve Outcome: Progressing Goal: Ability to demonstrate self-control will improve Outcome: Progressing   Problem: Safety: Goal: Periods of time without injury will increase Outcome: Progressing   Problem: Education: Goal: Will be free of psychotic symptoms Outcome: Progressing Goal: Knowledge of the prescribed therapeutic regimen will improve Outcome: Progressing   Problem: Self-Concept: Goal: Will verbalize positive feelings about self Outcome: Progressing   Problem: Education: Goal: Ability to state activities that reduce stress will improve Outcome: Progressing

## 2018-12-05 NOTE — Progress Notes (Signed)
Patient is able to join his peers in the courtyard for recreational activities, patient performed song and dancing also played basket ball with peers , mood is good and affect is appropriate no distress.

## 2018-12-05 NOTE — [facility] Group Notes (Signed)
LCSW Group Therapy Note 12/05/2018 1:15pm  Type of Therapy and Topic: Group Therapy: Feelings Around Returning Home & Establishing a Supportive Framework and Supporting Oneself When Supports Not Available  Participation Level: Did Not Attend  Description of Group:  Patients first processed thoughts and feelings about upcoming discharge. These included fears of upcoming changes, lack of change, new living environments, judgements and expectations from others and overall stigma of mental health issues. The group then discussed the definition of a supportive framework, what that looks and feels like, and how do to discern it from an unhealthy non-supportive network. The group identified different types of supports as well as what to do when your family/friends are less than helpful or unavailable  Therapeutic Goals  1. Patient will identify one healthy supportive network that they can use at discharge. 2. Patient will identify one factor of a supportive framework and how to tell it from an unhealthy network. 3. Patient able to identify one coping skill to use when they do not have positive supports from others. 4. Patient will demonstrate ability to communicate their needs through discussion and/or role plays.  Summary of Patient Progress:  Pt was invited to attend group but chose not to attend. CSW will continue to encourage pt to attend group throughout their admission.   Therapeutic Modalities Cognitive Behavioral Therapy Motivational Interviewing   Jackson [name]  [name], LCSW 12/05/2018 11:04 AM

## 2018-12-05 NOTE — Progress Notes (Signed)
Patient is maintaining safety guide lines and expected behaviors , no self injurious behavior, no threats of harm to self or others, denies any SI,HI,AVH denies depression or anxiety and no physical concerns, patient is in his room most of the , coping adequate, reading and writing on a paper. Compliant with his medications, minimal socialization, education is provided and encouraged. Thought process is coherent and logical , sleep is continuous only requiring 15 minutes safety checks , no distress.

## 2018-12-05 NOTE — Plan of Care (Signed)
Patient alert and oriented, in the milieu periodically for meals and medications. Otherwise patient is isolative to his room. Denies having any symptoms of depression/pain/anxiety, SI/HI/AVH at this time.  Encouraged to attend groups and outside recreation, patient states, "I will do my best to go."  Milieu remains safe with q 15 minute safety checks.

## 2018-12-05 NOTE — Plan of Care (Signed)
  Problem: Education: Goal: Knowledge of [facility] Education information/materials will improve Outcome: Progressing   Problem: Coping: Goal: Ability to verbalize frustrations and anger appropriately will improve Outcome: Progressing Goal: Ability to demonstrate self-control will improve Outcome: Progressing   Problem: Safety: Goal: Periods of time without injury will increase Outcome: Progressing   Problem: Education: Goal: Will be free of psychotic symptoms Outcome: Progressing Goal: Knowledge of the prescribed therapeutic regimen will improve Outcome: Progressing   Problem: Self-Concept: Goal: Will verbalize positive feelings about self Outcome: Progressing   Problem: Education: Goal: Ability to state activities that reduce stress will improve Outcome: Progressing   

## 2018-12-05 NOTE — Progress Notes (Signed)
[facility] MD Progress Note  12/05/2018 10:29 AM Jackson [name]  MRN:  [id number] Subjective: Follow-up for this gentleman with schizoaffective disorder.  Jackson [name] has been here since 10/17/18. Safe disposition is challenging.   Pt seen and chart reviewed.  Jackson [name] reports things are going well, and has no complaints.  Jackson [name] eats and sleep well.  Jackson [name] did not want to attend group yesterday.    RN: Mostly isolated in his room, but no behavioral issues.   [name] keeps himself busy trying to pursue the kind of paperwork Jackson [name] would need to apply for a guardianship hearing.  It was neatly written, organized spread on a bed.   Principal Problem: Schizoaffective disorder, bipolar type (HCC) Diagnosis: Principal Problem:   Schizoaffective disorder, bipolar type (HCC) Active Problems:   Noncompliance  Total Time spent with patient: 15 minutes  Past Psychiatric History: Patient has a history of bipolar disorder or schizoaffective disorder and has required lengthy hospitalization in the past  Past Medical History:  Past Medical History:  Diagnosis Date  . Bipolar 1 disorder (HCC)   . Schizoaffective disorder (HCC)    History reviewed. No pertinent surgical history. Family History: History reviewed. No pertinent family history. Family Psychiatric  History: None known Social History:  Social History   Substance and Sexual Activity  Alcohol Use Not on file     Social History   Substance and Sexual Activity  Drug Use Not on file    Social History   Socioeconomic History  . Marital status: Single    Spouse name: Not on file  . Number of children: Not on file  . Years of education: Not on file  . Highest education level: Not on file  Occupational History  . Not on file  Social Needs  . Financial resource strain: Not on file  . Food insecurity:    Worry: Not on file    Inability: Not on file  . Transportation needs:    Medical: Not on file    Non-medical: Not on file  Tobacco Use  . Smoking status:  Light Tobacco Smoker    Packs/day: 0.25    Types: Cigarettes  . Smokeless tobacco: Never Used  Substance and Sexual Activity  . Alcohol use: Not on file  . Drug use: Not on file  . Sexual activity: Not on file  Lifestyle  . Physical activity:    Days per week: Not on file    Minutes per session: Not on file  . Stress: Not on file  Relationships  . Social connections:    Talks on phone: Not on file    Gets together: Not on file    Attends religious service: Not on file    Active member of club or organization: Not on file    Attends meetings of clubs or organizations: Not on file    Relationship status: Not on file  Other Topics Concern  . Not on file  Social History Narrative  . Not on file   Additional Social History:   Sleep: Fair  Appetite:  Fair  Current Medications: Current Facility-Administered Medications  Medication Dose Route Frequency Provider Last Rate Last Dose  . acetaminophen (TYLENOL) tablet 650 mg  650 mg Oral Q6H PRN [name], MD   650 mg at 10/26/18 2302  . alum & mag hydroxide-simeth (MAALOX/MYLANTA) 200-200-20 MG/5ML suspension 30 mL  30 mL Oral Q4H PRN [name], MD   30 mL at 10/26/18 2129  . ARIPiprazole (ABILIFY) tablet 15  mg  15 mg Oral Daily [name], Jackquline DenmarkJohn T, MD   15 mg at 12/05/18 0749  . divalproex (DEPAKOTE ER) 24 hr tablet 1,000 mg  1,000 mg Oral QHS [name], [name], MD   1,000 mg at 12/04/18 2121  . hydrOXYzine (ATARAX/VISTARIL) tablet 50 mg  50 mg Oral Q4H PRN Terance HartStephens, [name], MD   50 mg at 10/28/18 16100918  . magnesium hydroxide (MILK OF MAGNESIA) suspension 30 mL  30 mL Oral Daily PRN Terance HartStephens, [name], MD      . OLANZapine [facility](ZYPREXA) injection 10 mg  10 mg Intramuscular Q6H PRN [name], [name], MD      . OLANZapine (ZYPREXA) tablet 40 mg  40 mg Oral QHS [name], Jackquline DenmarkJohn T, MD   40 mg at 12/04/18 2120  . traZODone (DESYREL) tablet 100 mg  100 mg Oral QHS PRN Terance HartStephens, [name], MD   100 mg at 11/26/18 2123    Lab  Results: No results found for this or any previous visit (from the past 48 hour(s)).  Blood Alcohol level:  Lab Results  Component Value Date   ETH <10 10/16/2018    Metabolic Disorder Labs: Lab Results  Component Value Date   HGBA1C 5.2 10/23/2018   MPG 102.54 10/23/2018   No results found for: PROLACTIN Lab Results  Component Value Date   CHOL 129 10/23/2018   TRIG 58 10/23/2018   HDL 50 10/23/2018   CHOLHDL 2.6 10/23/2018   VLDL 12 10/23/2018   LDLCALC 67 10/23/2018    Physical Findings: AIMS: Facial and Oral Movements Muscles of Facial Expression: None, normal Lips and Perioral Area: None, normal Jaw: None, normal Tongue: None, normal,Extremity Movements Upper (arms, wrists, hands, fingers): None, normal Lower (legs, knees, ankles, toes): None, normal, Trunk Movements Neck, shoulders, hips: None, normal, Overall Severity Severity of abnormal movements (highest score from questions above): None, normal Incapacitation due to abnormal movements: None, normal Patient's awareness of abnormal movements (rate only patient's report): No Awareness, Dental Status Current problems with teeth and/or dentures?: No Does patient usually wear dentures?: No  CIWA:  CIWA-Ar Total: 0 COWS:  COWS Total Score: 0  Musculoskeletal: Strength & Muscle Tone: within normal limits Gait & Station: normal Patient leans: N/A  Psychiatric Specialty Exam: Physical Exam  Nursing note and vitals reviewed. Constitutional: Inaaya [name] appears well-developed and well-nourished.  HENT:  Head: Normocephalic and atraumatic.  Eyes: Pupils are equal, round, and reactive to light. Conjunctivae are normal.  Neck: Normal range of motion.  Cardiovascular: Normal heart sounds.  Respiratory: Effort normal.  GI: Soft.  Musculoskeletal: Normal range of motion.  Neurological: Jackson [name] is alert.  Skin: Skin is warm and dry.  Psychiatric: Jackson [name] has a normal mood and affect. His behavior is normal. Judgment and thought  content normal.    Review of Systems  Constitutional: Negative.   HENT: Negative.   Eyes: Negative.   Respiratory: Negative.   Cardiovascular: Negative.   Gastrointestinal: Negative.   Musculoskeletal: Negative.   Skin: Negative.   Neurological: Negative.   Psychiatric/Behavioral: Negative.     Blood pressure 111/73, pulse 71, temperature 97.6 F (36.4 C), temperature source Oral, resp. rate 18, height 5\' 11"  (1.803 m), weight 88.5 kg, SpO2 100 %.Body mass index is 27.2 kg/m.  General Appearance: Casual  Eye Contact:  Good  Speech:  Clear and Coherent  Volume:  Normal  Mood:  Euthymic  Affect:  Constricted  Thought Process:  Goal Directed  Orientation:  Full (Time, Place, and Person)  Thought Content:  Logical  Suicidal Thoughts:  No  Homicidal Thoughts:  No  Memory:  Immediate;   Fair Recent;   Fair Remote;   Fair  Judgement:  Fair  Insight:  Fair  Psychomotor Activity:  Normal  Concentration:  Concentration: Fair  Recall:  [employer] of Knowledge:  Fair  Language:  Fair  Akathisia:  No  Handed:  Right  AIMS (if indicated):     Assets:  Communication Skills Desire for Improvement Financial Resources/Insurance Physical Health Resilience Social Support  ADL's:  Intact  Cognition:  WNL  Sleep:  Number of Hours: 5     Treatment Plan Summary: Daily contact with patient to assess and evaluate symptoms and progress in treatment and Medication management   Plan  # Schizophrenia -- continue Abilify 15mg  daily.  -- continue zyprexa 40mg  qhs -- continue Depakote ER 1000mg  qhs.  -- continue Trazodone 100mg  qhs prn.   # Disposition -- [facility] or [facility].  -- apply for guardianship?     Sandon Yoho, MD 12/05/2018, 10:29 AM

## 2018-12-06 NOTE — Plan of Care (Signed)
Patient shows the ability to verbalize his needs to staff. Patient has also made comments on how he feels that he is ready to be discharged. " I think I'm ready to be discharged, I feel the best I've felt in a long time."

## 2018-12-06 NOTE — [facility] Group Notes (Signed)
CSW with Asher Muir the NP called the patient's mother and guardian to discuss possible discharge.  CSW was a witness to discussion and did not lead discussion.  The following was discussed:  -mother requested that Dr. Lucianne Muss and Asher Muir review the pt's medical chart -mother requested that [facility] identify if the [facility] PD could be made aware that the pt is ill -Mother requested that patient have a 3 mother+ court order  CSW asked if she could have permission to begin calling group homes for possible placement and mother declined at this time until we are able to follow up with a meeting with CSW, Dr. Lucianne Muss and Asher Muir on 12/07/2018.   Penni Homans, MSW, LCSW 12/06/2018 3:46 PM

## 2018-12-06 NOTE — Progress Notes (Signed)
Recreation Therapy Notes   Date: 12/06/2018  Time: 9:30 am   Location: Craft room   Behavioral response: N/A   Intervention Topic: Coping skills  Discussion/Intervention: Patient did not attend group.   Clinical Observations/Feedback:  Patient did not attend group.   Shandy Vi LRT/CTRS        Theadora Noyes 12/06/2018 10:57 AM 

## 2018-12-06 NOTE — [facility] Counselor (Signed)
CSW spoke with Baldo Daub at [facility].  She confirmed patient was still at [facility] and still in need of a bed.  CSW was informed patient remains on the wait-list.   Penni Homans, MSW, LCSW 12/06/2018 8:28 AM

## 2018-12-06 NOTE — Progress Notes (Signed)
Patient alert and oriented, present in the milieu today interacting with peers. Patient making himself seen today with the loud tone in which he was speaking to staff. Patient engaged in non-aggressive conversations with staff members today. Denies having any thoughts of SI/HI/AVH or pain. Milieu remains safe with q 15 minute safety checks.

## 2018-12-06 NOTE — Progress Notes (Signed)
Patient received his medications with out any issues or side effects and denies Si/HI/AVH, patient is  Pleasant and approachable, patient went back to his room . Safety is maintained no distress.

## 2018-12-06 NOTE — Plan of Care (Signed)
  Problem: Education: Goal: Knowledge of [facility] Education information/materials will improve Outcome: Progressing   Problem: Coping: Goal: Ability to verbalize frustrations and anger appropriately will improve Outcome: Progressing Goal: Ability to demonstrate self-control will improve Outcome: Progressing   Problem: Safety: Goal: Periods of time without injury will increase Outcome: Progressing   Problem: Education: Goal: Will be free of psychotic symptoms Outcome: Progressing Goal: Knowledge of the prescribed therapeutic regimen will improve Outcome: Progressing   Problem: Self-Concept: Goal: Will verbalize positive feelings about self Outcome: Progressing   Problem: Education: Goal: Ability to state activities that reduce stress will improve Outcome: Progressing   

## 2018-12-06 NOTE — [facility] Group Notes (Signed)
[facility] Group Notes:  (Nursing/MHT/Case Management/Adjunct)  Date:  12/06/2018  Time:  9:05 PM  Type of Therapy:  Group Therapy  Participation Level:  Did Not Attend   Jackson [name] 12/06/2018, 9:05 PM

## 2018-12-06 NOTE — Progress Notes (Addendum)
Patient ID: Jackson [name], male   DOB: 08/08/1985, 33 y.o.   MRN: [id number]  [facility]  12/06/2018 4:21 PM Jackson [name]  MRN:  [id number]   Subjective: "I'm doing well."  Denies hearing any voices or seeing anything, no paranoia, no suicidal/homicidal ideations.  Working on paperwork to try and obtain his guardianship, well organized layout on his bed with copies.    Assessment:  Admitted on 10/17/18 for mania and psychotic symptoms along with some aggression after being off his medications for a month. Safe disposition is challenging.   Objective:  Pt seen and chart reviewed.  He reports things are going well, and has no complaints.  No issues with eating or sleep.  He seems to be at his baseline at day 7249 of hospitalization and has been calm and cooperative for days.  His mother did not want him to return to a group home but [facility] despite already being stable.  The [facility] based on this is slim to none.  Medical director would like him to discharge on Wednesday as he is stable and has been for days.  Compliant with his medications, interacts appropriately.  He was outside today with peers and staff while listening to music; appeared to be enjoying himself.    Collateral information from his mother called with the social worker and this provider.  When discussing the plan, she requested he be court ordered to take a long term injectable like Abilify Maintenna prior to discharge due to his instability when he stops taking his medications which happens often.  Then, he leaves the group home and roams the streets.  This was agreed upon and then agreeable to group home placement.  Another meeting tomorrow to continue to finalize the plan.  Principal Problem: Schizoaffective disorder, bipolar type (HCC) Diagnosis: Principal Problem:   Schizoaffective disorder, bipolar type (HCC) Active Problems:   Noncompliance  Total Time spent with  patient: 30 minutes  Past Psychiatric History: Patient has a history of bipolar disorder or schizoaffective disorder and has required lengthy hospitalization in the past  Past Medical History:  Past Medical History:  Diagnosis Date  . Bipolar 1 disorder (HCC)   . Schizoaffective disorder (HCC)    History reviewed. No pertinent surgical history. Family History: History reviewed. No pertinent family history. Family Psychiatric  History: None known Social History:  Social History   Substance and Sexual Activity  Alcohol Use Not on file     Social History   Substance and Sexual Activity  Drug Use Not on file    Social History   Socioeconomic History  . Marital status: Single    Spouse name: Not on file  . Number of children: Not on file  . Years of education: Not on file  . Highest education level: Not on file  Occupational History  . Not on file  Social Needs  . Financial resource strain: Not on file  . Food insecurity:    Worry: Not on file    Inability: Not on file  . Transportation needs:    Medical: Not on file    Non-medical: Not on file  Tobacco Use  . Smoking status: Light Tobacco Smoker    Packs/day: 0.25    Types: Cigarettes  . Smokeless tobacco: Never Used  Substance and Sexual Activity  . Alcohol use: Not on file  . Drug use: Not on file  . Sexual activity: Not  on file  Lifestyle  . Physical activity:    Days per week: Not on file    Minutes per session: Not on file  . Stress: Not on file  Relationships  . Social connections:    Talks on phone: Not on file    Gets together: Not on file    Attends religious service: Not on file    Active member of club or organization: Not on file    Attends meetings of clubs or organizations: Not on file    Relationship status: Not on file  Other Topics Concern  . Not on file  Social History Narrative  . Not on file   Additional Social History:   Sleep: Good  Appetite:  Good  Current  Medications: Current Facility-Administered Medications  Medication Dose Route Frequency Provider Last Rate Last Dose  . acetaminophen (TYLENOL) tablet 650 mg  650 mg Oral Q6H PRN [name], MD   650 mg at 10/26/18 2302  . alum & mag hydroxide-simeth (MAALOX/MYLANTA) 200-200-20 MG/5ML suspension 30 mL  30 mL Oral Q4H PRN [name], MD   30 mL at 10/26/18 2129  . ARIPiprazole (ABILIFY) tablet 15 mg  15 mg Oral Daily [name], [name], MD   15 mg at 12/06/18 0806  . divalproex (DEPAKOTE ER) 24 hr tablet 1,000 mg  1,000 mg Oral QHS [name], [name], MD   1,000 mg at 12/05/18 2023  . hydrOXYzine (ATARAX/VISTARIL) tablet 50 mg  50 mg Oral Q4H PRN [name], MD   50 mg at 10/28/18 7829  . magnesium hydroxide (MILK OF MAGNESIA) suspension 30 mL  30 mL Oral Daily PRN [name], MD      . OLANZapine [facility]) injection 10 mg  10 mg Intramuscular Q6H PRN [name], [name], MD      . OLANZapine (ZYPREXA) tablet 40 mg  40 mg Oral QHS [name], [name], MD   40 mg at 12/05/18 2024  . traZODone (DESYREL) tablet 100 mg  100 mg Oral QHS PRN [name], MD   100 mg at 11/26/18 2123    Lab Results: No results found for this or any previous visit (from the past 48 hour(s)).  Blood Alcohol level:  Lab Results  Component Value Date   ETH <10 10/16/2018    Metabolic Disorder Labs: Lab Results  Component Value Date   HGBA1C 5.2 10/23/2018   MPG 102.54 10/23/2018   No results found for: PROLACTIN Lab Results  Component Value Date   CHOL 129 10/23/2018   TRIG 58 10/23/2018   HDL 50 10/23/2018   CHOLHDL 2.6 10/23/2018   VLDL 12 10/23/2018   LDLCALC 67 10/23/2018    Physical Findings: AIMS: Facial and Oral Movements Muscles of Facial Expression: None, normal Lips and Perioral Area: None, normal Jaw: None, normal Tongue: None, normal,Extremity Movements Upper (arms, wrists, hands, fingers): None, normal Lower (legs, knees, ankles, toes): None, normal, Trunk  Movements Neck, shoulders, hips: None, normal, Overall Severity Severity of abnormal movements (highest score from questions above): None, normal Incapacitation due to abnormal movements: None, normal Patient's awareness of abnormal movements (rate only patient's report): No Awareness, Dental Status Current problems with teeth and/or dentures?: No Does patient usually wear dentures?: No  CIWA:  CIWA-Ar Total: 0 COWS:  COWS Total Score: 0  Musculoskeletal: Strength & Muscle Tone: within normal limits Gait & Station: normal Patient leans: N/A  Psychiatric Specialty Exam: Physical Exam  Nursing note and vitals reviewed. Constitutional:  He appears well-developed and well-nourished.  HENT:  Head: Normocephalic and atraumatic.  Eyes: Pupils are equal, round, and reactive to light. Conjunctivae are normal.  Neck: Normal range of motion.  Cardiovascular: Normal heart sounds.  Respiratory: Effort normal.  GI: Soft.  Musculoskeletal: Normal range of motion.  Neurological: He is alert.  Skin: Skin is warm and dry.  Psychiatric: His speech is normal and behavior is normal. Judgment and thought content normal. His mood appears anxious. His affect is blunt. Cognition and memory are normal.    Review of Systems  Constitutional: Negative.   HENT: Negative.   Eyes: Negative.   Respiratory: Negative.   Cardiovascular: Negative.   Gastrointestinal: Negative.   Musculoskeletal: Negative.   Skin: Negative.   Neurological: Negative.   Psychiatric/Behavioral: The patient is nervous/anxious.     Blood pressure 111/73, pulse 71, temperature 97.6 F (36.4 C), temperature source Oral, resp. rate 18, height 5\' 11"  (1.803 m), weight 88.5 kg, SpO2 100 %.Body mass index is 27.2 kg/m.  General Appearance: Casual  Eye Contact:  Good  Speech:  Clear and Coherent  Volume:  Normal  Mood:  Euthymic  Affect:  Blunt  Thought Process:  Goal Directed  Orientation:  Full (Time, Place, and Person)   Thought Content:  Logical  Suicidal Thoughts:  No  Homicidal Thoughts:  No  Memory:  Immediate;   Fair Recent;   Fair Remote;   Fair  Judgement:  Fair  Insight:  Fair  Psychomotor Activity:  Normal  Concentration:  Concentration: Fair  Recall:  [employer] of Knowledge:  Fair  Language:  Fair  Akathisia:  No  Handed:  Right  AIMS (if indicated):     Assets:  Communication Skills Desire for Improvement Financial Resources/Insurance Physical Health Resilience Social Support  ADL's:  Intact  Cognition:  WNL  Sleep:  Number of Hours: 5.5     Treatment Plan Summary: Daily contact with patient to assess and evaluate symptoms and progress in treatment and Medication management   # Schizoaffective disorder, bipolar type -- continue Abilify 15mg  daily.  -- continue zyprexa 40mg  qhs -- continue Depakote ER 1000mg  qhs.   Phone meeting with his guardian, his mother, regarding discharge plan and long-term antipsychotic  Insomnia: -- continue Trazodone 100mg  qhs prn.   Anxiety: -Continued hydroxyzine 50 mg every four hours PRN  - Will maintain Q 15 minutes observation for safety.   -No new labs  -Patient will participate in group, individual, and milieu therapy.Psychotherapy: Social and Doctor, hospital, learning based strategies, cognitive behavioral psychotherapies can be considered.   -Will continue to monitor patient's mood and behavior.  -Social Work working on discharge planning for Wednesday, 12/08/2018, but may take longer due to group home placement need  -Discharge concerns will also be addressed: Safety, stabilization, and access to medication  Nanine Means, NP 12/06/2018, 4:21 PM

## 2018-12-06 NOTE — [facility] Group Notes (Signed)
LCSW Group Therapy Note   12/06/2018 1:00 PM  Type of Therapy and Topic:  Group Therapy:  Overcoming Obstacles   Participation Level:  Did Not Attend   Description of Group:    In this group patients will be encouraged to explore what they see as obstacles to their own wellness and recovery. They will be guided to discuss their thoughts, feelings, and behaviors related to these obstacles. The group will process together ways to cope with barriers, with attention given to specific choices patients can make. Each patient will be challenged to identify changes they are motivated to make in order to overcome their obstacles. This group will be process-oriented, with patients participating in exploration of their own experiences as well as giving and receiving support and challenge from other group members.   Therapeutic Goals: 1. Patient will identify personal and current obstacles as they relate to admission. 2. Patient will identify barriers that currently interfere with their wellness or overcoming obstacles.  3. Patient will identify feelings, thought process and behaviors related to these barriers. 4. Patient will identify two changes they are willing to make to overcome these obstacles:      Summary of Patient Progress X   Therapeutic Modalities:   Cognitive Behavioral Therapy Solution Focused Therapy Motivational Interviewing Relapse Prevention Therapy  Penni Homans, MSW, LCSW 12/06/2018 12:35 PM

## 2018-12-07 LAB — VALPROIC ACID LEVEL: Valproic Acid Lvl: 55 ug/mL (ref 50.0–100.0)

## 2018-12-07 MED ORDER — PALIPERIDONE ER 3 MG PO TB24: 6.0000 mg | ORAL_TABLET | Freq: Every day | ORAL | Status: DC

## 2018-12-07 MED ORDER — OLANZAPINE 10 MG PO TABS
30.0000 mg | ORAL_TABLET | Freq: Every day | ORAL | Status: DC
  Administered 2018-12-07 – 2019-01-06 (×31): 30 mg via ORAL
  Filled 2018-12-07 (×31): qty 3

## 2018-12-07 MED ORDER — ARIPIPRAZOLE 5 MG PO TABS
15.0000 mg | ORAL_TABLET | Freq: Every day | ORAL | Status: DC
  Administered 2018-12-07 – 2018-12-09 (×3): 15 mg via ORAL
  Filled 2018-12-07 (×3): qty 1

## 2018-12-07 NOTE — Progress Notes (Signed)
Patient alert and oriented x 4, affect is blunted, he denies SI/HI/AVH, noted interacting appropriately with peers and staff, no distress noted, complaint with medication regimen. Patient was offered emotional support and  encouraged to attend wrap up group. Patient did not attend evening wrap up group, 15 minutes safety checks maintained will continue to monitor.

## 2018-12-07 NOTE — Progress Notes (Signed)
Patient ID: Jackson [name], male   DOB: 09/16/1985, 33 y.o.   MRN: [id number]  [facility]  12/07/2018 1:32 PM Jackson Mellowerrell Gripp  MRN:  [id number]   Subjective: "No, I do not want Invega please.  It made me gain too much weight."  Told him we would keep the Abilify, he pleasantly said, "I love you, you are precious in my heart." Requested to have all of his medications in the evening so he can "get up earlier and start working out."  Assessment:  Admitted on 10/17/18 for mania and psychotic symptoms along with some aggression after being off his medications for a month. Safe disposition is challenging.   Objective:  Pt seen and chart reviewed including the one [facility].  Prior to admission the patient had been off his medications for a little over a month.  He lost weight and is concerned about gaining it back. Requested adamantly about not going back [facility] as he feels this caused him more weight gain. He also wanted to move his Abilify to the evening with his other medications so he was not so drowsy in the am and could get up earlier.  These requests were granted.  He was pleasant and appropriate, smiling frequently.  Staff reports he is interacting appropriately on the unit.  Collateral information from his mother called with the social worker, this provider, and psychiatrist.  The plan for long term injectable discussed along with an outpatient IVC to make sure he gets this medication, mother seemed very satisfied with this.  Discussed placement and she would like something higher than a group home but agreeable for him to go to a group home with the outpatient IVC in place.    Principal Problem: Schizoaffective disorder, bipolar type (HCC) Diagnosis: Principal Problem:   Schizoaffective disorder, bipolar type (HCC) Active Problems:   Noncompliance  Total Time spent with patient: 45 minutes  Past Psychiatric History: Patient has a history of bipolar disorder or  schizoaffective disorder and has required lengthy hospitalization in the past  Past Medical History:  Past Medical History:  Diagnosis Date  . Bipolar 1 disorder (HCC)   . Schizoaffective disorder (HCC)    History reviewed. No pertinent surgical history. Family History: History reviewed. No pertinent family history. Family Psychiatric  History: None known Social History:  Social History   Substance and Sexual Activity  Alcohol Use Not on file     Social History   Substance and Sexual Activity  Drug Use Not on file    Social History   Socioeconomic History  . Marital status: Single    Spouse name: Not on file  . Number of children: Not on file  . Years of education: Not on file  . Highest education level: Not on file  Occupational History  . Not on file  Social Needs  . Financial resource strain: Not on file  . Food insecurity:    Worry: Not on file    Inability: Not on file  . Transportation needs:    Medical: Not on file    Non-medical: Not on file  Tobacco Use  . Smoking status: Light Tobacco Smoker    Packs/day: 0.25    Types: Cigarettes  . Smokeless tobacco: Never Used  Substance and Sexual Activity  . Alcohol use: Not on file  . Drug use: Not on file  . Sexual activity: Not on file  Lifestyle  . Physical activity:    Days per week: Not on file  Minutes per session: Not on file  . Stress: Not on file  Relationships  . Social connections:    Talks on phone: Not on file    Gets together: Not on file    Attends religious service: Not on file    Active member of club or organization: Not on file    Attends meetings of clubs or organizations: Not on file    Relationship status: Not on file  Other Topics Concern  . Not on file  Social History Narrative  . Not on file   Additional Social History:   Sleep: Good  Appetite:  Good  Current Medications: Current Facility-Administered Medications  Medication Dose Route Frequency Provider Last Rate  Last Dose  . acetaminophen (TYLENOL) tablet 650 mg  650 mg Oral Q6H PRN [name], MD   650 mg at 10/26/18 2302  . alum & mag hydroxide-simeth (MAALOX/MYLANTA) 200-200-20 MG/5ML suspension 30 mL  30 mL Oral Q4H PRN [name], MD   30 mL at 10/26/18 2129  . ARIPiprazole (ABILIFY) tablet 15 mg  15 mg Oral Daily [name], [name], MD   15 mg at 12/07/18 0749  . divalproex (DEPAKOTE ER) 24 hr tablet 1,000 mg  1,000 mg Oral QHS [name], [name], MD   1,000 mg at 12/06/18 2207  . hydrOXYzine (ATARAX/VISTARIL) tablet 50 mg  50 mg Oral Q4H PRN [name], MD   50 mg at 10/28/18 2229  . magnesium hydroxide (MILK OF MAGNESIA) suspension 30 mL  30 mL Oral Daily PRN [name], MD      . OLANZapine [facility]) injection 10 mg  10 mg Intramuscular Q6H PRN [name], [name], MD      . OLANZapine (ZYPREXA) tablet 40 mg  40 mg Oral QHS [name], [name], MD   40 mg at 12/06/18 2207  . traZODone (DESYREL) tablet 100 mg  100 mg Oral QHS PRN [name], MD   100 mg at 11/26/18 2123    Lab Results: No results found for this or any previous visit (from the past 48 hour(s)).  Blood Alcohol level:  Lab Results  Component Value Date   ETH <10 10/16/2018    Metabolic Disorder Labs: Lab Results  Component Value Date   HGBA1C 5.2 10/23/2018   MPG 102.54 10/23/2018   No results found for: PROLACTIN Lab Results  Component Value Date   CHOL 129 10/23/2018   TRIG 58 10/23/2018   HDL 50 10/23/2018   CHOLHDL 2.6 10/23/2018   VLDL 12 10/23/2018   LDLCALC 67 10/23/2018    Physical Findings: AIMS: Facial and Oral Movements Muscles of Facial Expression: None, normal Lips and Perioral Area: None, normal Jaw: None, normal Tongue: None, normal,Extremity Movements Upper (arms, wrists, hands, fingers): None, normal Lower (legs, knees, ankles, toes): None, normal, Trunk Movements Neck, shoulders, hips: None, normal, Overall Severity Severity of abnormal movements (highest  score from questions above): None, normal Incapacitation due to abnormal movements: None, normal Patient's awareness of abnormal movements (rate only patient's report): No Awareness, Dental Status Current problems with teeth and/or dentures?: No Does patient usually wear dentures?: No  CIWA:  CIWA-Ar Total: 0 COWS:  COWS Total Score: 0  Musculoskeletal: Strength & Muscle Tone: within normal limits Gait & Station: normal Patient leans: N/A  Psychiatric Specialty Exam: Physical Exam  Nursing note and vitals reviewed. Constitutional: He appears well-developed and well-nourished.  HENT:  Head: Normocephalic and atraumatic.  Eyes: Pupils are equal, round, and reactive  to light. Conjunctivae are normal.  Neck: Normal range of motion.  Cardiovascular: Normal heart sounds.  Respiratory: Effort normal.  GI: Soft.  Musculoskeletal: Normal range of motion.  Neurological: He is alert.  Skin: Skin is warm and dry.  Psychiatric: His speech is normal and behavior is normal. Judgment and thought content normal. His mood appears anxious. His affect is blunt. Cognition and memory are normal.    Review of Systems  Constitutional: Negative.   HENT: Negative.   Eyes: Negative.   Respiratory: Negative.   Cardiovascular: Negative.   Gastrointestinal: Negative.   Musculoskeletal: Negative.   Skin: Negative.   Neurological: Negative.   Psychiatric/Behavioral: The patient is nervous/anxious.     Blood pressure 111/73, pulse 71, temperature 97.6 F (36.4 C), temperature source Oral, resp. rate 18, height  (1.803 m), weight 88.5 kg, SpO2 100 %.Body mass index is 27.2 kg/m.  General Appearance: Casual  Eye Contact:  Good  Speech:  Clear and Coherent  Volume:  Normal  Mood:  Euthymic  Affect:  Blunt  Thought Process:  Goal Directed  Orientation:  Full (Time, Place, and Person)  Thought Content:  Logical  Suicidal Thoughts:  No  Homicidal Thoughts:  No  Memory:  Immediate;    Fair Recent;   Fair Remote;   Fair  Judgement:  Fair  Insight:  Fair  Psychomotor Activity:  Normal  Concentration:  Concentration: Fair  Recall:  [employer] of Knowledge:  Fair  Language:  Fair  Akathisia:  No  Handed:  Right  AIMS (if indicated):     Assets:  Communication Skills Desire for Improvement Financial Resources/Insurance Physical Health Resilience Social Support  ADL's:  Intact  Cognition:  WNL  Sleep:  Number of Hours: 5     Treatment Plan Summary: Daily contact with patient to assess and evaluate symptoms and progress in treatment and Medication management   # Schizoaffective disorder, bipolar type -- discontinued Abilify 15 mg daily but then reinstated it per patient's request and moved it to the pm--plan to do Abilify Maintenna prior to discharge. --Started Invega 6 mg daily based on [facility] medication list with plan to start Hinda Glatter Sustenna--this was negated by the patient and not started -- decreased zyprexa 40 mg qhs to 30 mg qhs due weight gain -- continue Depakote ER 1000 mg qhs, Depakote level ordered.  Phone meeting with his guardian, his mother, regarding discharge plan and long-term antipsychotic with the social worker, psychiatrist, and NP  Insomnia: -- continue Trazodone  qhs prn.   Anxiety: -Continued hydroxyzine 50 mg every four hours PRN  - Will maintain Q 15 minutes observation for safety.   -Ordered a Depakote and Vitamin D level  -Ordered an EKG for evaluation  -Patient will participate in group, individual, and milieu therapy.Psychotherapy: Social and Doctor, hospital, learning based strategies, cognitive behavioral psychotherapies can be considered.   -Will continue to monitor patient's mood and behavior.  -Social Work working on discharge planning pending group home placement  -Discharge concerns will also be addressed: Safety, stabilization, and access to medication  Nanine Means,  NP 12/07/2018, 1:32 PM

## 2018-12-07 NOTE — Progress Notes (Signed)
D: Patient stated slept good last night .Stated appetite  good and energy level  normal. Stated concentration  good . Stated no Depression hopeless or anxiety Denies suicidal  homicidal ideations  .  No auditory hallucinations  No pain concerns . Appropriate ADL'S. Interacting with peers and staff. Patient aware of information given relating to [facility] education , patient able to verbalize understanding . No anger outburst for past 24 hours . Able to maintained self control . Voice of no safety concerns. Thought process improving. Compliant with medication. Patient able to verbalize positive feeling of self . Marland KitchenPatient remains delusional , with magical thinking . Isolates to room , picks and chooses groups participations .Patient refused EKG A: Encourage patient participation with unit programming . Instruction  Given on  Medication , verbalize understanding. R: Voice no other concerns. Staff continue to monitor

## 2018-12-07 NOTE — Progress Notes (Signed)
Recreation Therapy Notes  Date: 12/07/2018  Time: 9:30 am   Location: Craft room   Behavioral response: N/A   Intervention Topic: Necessities  Discussion/Intervention: Patient did not attend group.   Clinical Observations/Feedback:  Patient did not attend group.   Jackson [name] LRT/CTRS         [name] 12/07/2018 11:06 AM

## 2018-12-07 NOTE — Plan of Care (Signed)
Patient aware of  information  given relating to [facility] education , patient  able to verbalize  understanding .  No anger outburst for past 24 hours . Able to maintained  self control . Voice of no safety concerns. Thought process improving. Compliant  with medication. Patient able to verbalize positive feeling of self .  Marland Kitchen Patient remains delusional , with magical thinking . Isolates to room , picks and chooses Problem: Education: Goal: Knowledge of [facility] Education information/materials will improve Outcome: Progressing   Problem: Coping: Goal: Ability to verbalize frustrations and anger appropriately will improve Outcome: Progressing Goal: Ability to demonstrate self-control will improve Outcome: Progressing   Problem: Safety: Goal: Periods of time without injury will increase Outcome: Progressing   Problem: Education: Goal: Will be free of psychotic symptoms Outcome: Progressing Goal: Knowledge of the prescribed therapeutic regimen will improve Outcome: Progressing   Problem: Self-Concept: Goal: Will verbalize positive feelings about self Outcome: Progressing   Problem: Education: Goal: Ability to state activities that reduce stress will improve Outcome: Progressing   groups participations .

## 2018-12-07 NOTE — Plan of Care (Signed)
  Problem: Education: Goal: Will be free of psychotic symptoms Outcome: Progressing  Patient aspera free of psychosis

## 2018-12-07 NOTE — [place] FL2 (Signed)
  [place] MEDICAID FL2 LEVEL OF CARE SCREENING TOOL     IDENTIFICATION  Patient Name: Jackson [name] Birthdate: 11/23/1985 Sex: male Admission Date (Current Location): 10/17/2018  [place] and [place] Number:  [address] [phone] [facility] Facility and Address:  [facility], [address], [place], [place] [address]      Provider Number: [id number]  Attending Physician Name and Address:  Audery Amel, MD  Relative Name and Phone Number:  Caprice Kluver, mother and legal guardian, [phone]    Current Level of Care: Hospital Recommended Level of Care: Other (Comment)(group home) Prior Approval Number:    Date Approved/Denied:   PASRR Number:    Discharge Plan: Other (Comment)(Group Home)    Current Diagnoses: Patient Active Problem List   Diagnosis Date Noted  . Noncompliance 10/18/2018  . Schizoaffective disorder, bipolar type (HCC) 10/17/2018    Orientation RESPIRATION BLADDER Height & Weight     Self, Time, Situation, Place  Normal Continent Weight: 195 lb (88.5 kg) Height:  5\' 11"  (180.3 cm)  BEHAVIORAL SYMPTOMS/MOOD NEUROLOGICAL BOWEL NUTRITION STATUS      Continent Diet(Regular)  AMBULATORY STATUS COMMUNICATION OF NEEDS Skin   Independent Verbally Normal                       Personal Care Assistance Level of Assistance              Functional Limitations Info             SPECIAL CARE FACTORS FREQUENCY                       Contractures      Additional Factors Info  Code Status Code Status Info: Full             Current Medications (12/07/2018):  This is the current hospital active medication list Current Facility-Administered Medications  Medication Dose Route Frequency Provider Last Rate Last Dose  . acetaminophen (TYLENOL) tablet 650 mg  650 mg Oral Q6H PRN [name], MD   650 mg at 10/26/18 2302  . alum & mag hydroxide-simeth (MAALOX/MYLANTA) 200-200-20 MG/5ML suspension 30 mL  30 mL  Oral Q4H PRN [name], MD   30 mL at 10/26/18 2129  . ARIPiprazole (ABILIFY) tablet 15 mg  15 mg Oral Daily [name], [name], MD   15 mg at 12/07/18 0749  . divalproex (DEPAKOTE ER) 24 hr tablet 1,000 mg  1,000 mg Oral QHS [name], [name], MD   1,000 mg at 12/06/18 2207  . hydrOXYzine (ATARAX/VISTARIL) tablet 50 mg  50 mg Oral Q4H PRN [name], MD   50 mg at 10/28/18 3299  . magnesium hydroxide (MILK OF MAGNESIA) suspension 30 mL  30 mL Oral Daily PRN [name], MD      . OLANZapine [facility]) injection 10 mg  10 mg Intramuscular Q6H PRN [name], [name], MD      . OLANZapine (ZYPREXA) tablet 40 mg  40 mg Oral QHS [name], [name], MD   40 mg at 12/06/18 2207  . traZODone (DESYREL) tablet 100 mg  100 mg Oral QHS PRN [name], MD   100 mg at 11/26/18 2123     Discharge Medications: Please see discharge summary for a list of discharge medications.  Relevant Imaging Results:  Relevant Lab Results:   Additional Information    Harden Mo, LCSW

## 2018-12-07 NOTE — BHH Counselor (Signed)
  CSW contacted the following to identify if there are any male beds available:  [facility], [phone] CSW was asked to call the owner Rosita Fire  at [phone] or [phone].  CSW was asked to fax FL2 and any important information to [phone].  The [address] [facility], [place] [phone] No male beds available, will have one in a couple of weeks.    [facility], [phone] The number was not in working order.  [facility], [phone] Voicemail box was full.  [address] [facility] [place], [phone] CSW received message that "all circuits were busy" each time the call was made.  [facility], [phone] He will call and check his family's other facilities.  Bed mentioned on Monday is likely going to another consumer.    [facility], [phone] Male bed available.  Requested information to be sent.   [facility] Norman, [phone] No beds available.  [place], [phone] Left HIPAA compliant voicemail  Mauri Reading,  Requested FL2 and information.    [facility], [phone] Left HIPAA compliant voicemail.  [facility], [phone] Male beds available.  CSW obtained the fax number to fax information once consent is given.  Valerie Roys, [phone] Left HIPAA compliant voicemail.  [facility]    [phone]   L/m  [facility]  [phone]  [facility]  [phone] no beds available  [facility]  [phone]  L/m  [facility]    [phone]  N/a  [facility]   [phone]  no beds available  [facility]   [phone]   L/m  [facility]  [phone] no beds available  [facility]     [phone]   L/m  [facility]     [phone]    may not be able to take due to virus but fax fl2 to          [phone] attn [facility]     [phone]    fax machine  [facility]    [phone]   L/m  [facility]    [phone]    fax machine  [facility]   [phone]     no beds available  [facility]    [phone]    1 bed available fax fl2 to [phone]   [facility] [phone]    beds available- fax fl2 to  [phone] attn [address]    [phone]    may have beds available  fax fl2 to [phone] attn Janace Aris  [facility]   [phone] l/m  [facility]   [phone] l/m  [facility]   [phone] l/m      CSW notes that once mother has returned signed releases with the information will be sent to the group homes.  CSW has faxed releases to the mother and received confirmation of the receipt of the fax.  Penni Homans, MSW, LCSW 12/07/2018 3:29 PM

## 2018-12-07 NOTE — BHH Group Notes (Signed)
Feelings Around Diagnosis 12/07/2018 1PM  Type of Therapy/Topic:  Group Therapy:  Feelings about Diagnosis  Participation Level:  Did Not Attend   Description of Group:   This group will allow patients to explore their thoughts and feelings about diagnoses they have received. Patients will be guided to explore their level of understanding and acceptance of these diagnoses. Facilitator will encourage patients to process their thoughts and feelings about the reactions of others to their diagnosis and will guide patients in identifying ways to discuss their diagnosis with significant others in their lives. This group will be process-oriented, with patients participating in exploration of their own experiences, giving and receiving support, and processing challenge from other group members.   Therapeutic Goals: 1. Patient will demonstrate understanding of diagnosis as evidenced by identifying two or more symptoms of the disorder 2. Patient will be able to express two feelings regarding the diagnosis 3. Patient will demonstrate their ability to communicate their needs through discussion and/or role play  Summary of Patient Progress:       Therapeutic Modalities:   Cognitive Behavioral Therapy Brief Therapy Feelings Identification    Suzan Slick, LCSW 12/07/2018 2:17 PM

## 2018-12-08 LAB — SARS CORONAVIRUS 2 BY RT PCR (HOSPITAL ORDER, PERFORMED IN ~~LOC~~ HOSPITAL LAB): SARS Coronavirus 2: NEGATIVE

## 2018-12-08 LAB — VITAMIN D 25 HYDROXY (VIT D DEFICIENCY, FRACTURES): Vit D, 25-Hydroxy: 36 ng/mL (ref 30.0–100.0)

## 2018-12-08 MED ORDER — ARIPIPRAZOLE ER 400 MG IM SRER
400.0000 mg | INTRAMUSCULAR | Status: DC
  Administered 2018-12-08 – 2019-01-05 (×2): 400 mg via INTRAMUSCULAR
  Filled 2018-12-08 (×2): qty 2

## 2018-12-08 MED ORDER — INFLUENZA VAC SPLIT QUAD 0.5 ML IM SUSY
0.5000 mL | PREFILLED_SYRINGE | Freq: Once | INTRAMUSCULAR | Status: DC
  Filled 2018-12-08 (×2): qty 0.5

## 2018-12-08 NOTE — Progress Notes (Signed)
  D: Patient stated he wasn't going to take the Abilify Maintena   Stated he throws up blood every morning and having   Stomach upset. R: Patient refused medication , able to talk to [name] NP  R: Patient  Will speak to Dr. Lucianne Muss tomorrow.

## 2018-12-08 NOTE — BHH Counselor (Signed)
CSW spoke with [facility]U Director who reports that the patient would need a bed at [facility] before Covid-19 testing could be completed.   Penni Homans, MSW, LCSW 12/08/2018 12:40 PM

## 2018-12-08 NOTE — Plan of Care (Signed)
  Problem: Education: Goal: Will be free of psychotic symptoms Outcome: Progressing  Patient appears free of psychosis.  

## 2018-12-08 NOTE — Tx Team (Signed)
Interdisciplinary Treatment and Diagnostic Plan Update  12/08/2018 Time of Session: 8:30AM  Jackson [name] MRN: [id number]  Principal Diagnosis: Schizoaffective disorder, bipolar type [facility](HCC)  Secondary Diagnoses: Principal Problem:   Schizoaffective disorder, bipolar type (HCC) Active Problems:   Noncompliance   Current Medications:  Current Facility-Administered Medications  Medication Dose Route Frequency Provider Last Rate Last Dose  . acetaminophen (TYLENOL) tablet 650 mg  650 mg Oral Q6H PRN Terance HartStephens, [name], MD   650 mg at 10/26/18 2302  . alum & mag hydroxide-simeth (MAALOX/MYLANTA) 200-200-20 MG/5ML suspension 30 mL  30 mL Oral Q4H PRN Terance HartStephens, [name], MD   30 mL at 10/26/18 2129  . ARIPiprazole (ABILIFY) tablet 15 mg  15 mg Oral QHS Charm RingsLord, Jamison Y, NP   15 mg at 12/07/18 2127  . divalproex (DEPAKOTE ER) 24 hr tablet 1,000 mg  1,000 mg Oral QHS [name], [name], MD   1,000 mg at 12/07/18 2127  . hydrOXYzine (ATARAX/VISTARIL) tablet 50 mg  50 mg Oral Q4H PRN Terance HartStephens, [name], MD   50 mg at 10/28/18 [id number]  . Influenza vac split quadrivalent PF (FLUARIX) injection 0.5 mL  0.5 mL Intramuscular Once Charm RingsLord, Jamison Y, NP      . magnesium hydroxide (MILK OF MAGNESIA) suspension 30 mL  30 mL Oral Daily PRN Terance HartStephens, [name], MD      . OLANZapine [facility](ZYPREXA) injection 10 mg  10 mg Intramuscular Q6H PRN [name], [name], MD      . OLANZapine (ZYPREXA) tablet 30 mg  30 mg Oral QHS Charm RingsLord, Jamison Y, NP   30 mg at 12/07/18 2127  . traZODone (DESYREL) tablet 100 mg  100 mg Oral QHS PRN Terance HartStephens, [name], MD   100 mg at 11/26/18 2123   PTA Medications: No medications prior to admission.    Patient Stressors: Health problems Medication change or noncompliance  Patient Strengths: Manufacturing systems engineerCommunication skills Religious Affiliation Supportive family/friends  Treatment Modalities: Medication Management, Group therapy, Case management,  1 to 1 session with clinician, Psychoeducation,  Recreational therapy.   Physician Treatment Plan for Primary Diagnosis: Schizoaffective disorder, bipolar type (HCC) Long Term Goal(s): Improvement in symptoms so as ready for discharge Improvement in symptoms so as ready for discharge   Short Term Goals: Ability to demonstrate self-control will improve Ability to identify and develop effective coping behaviors will improve Compliance with prescribed medications will improve  Medication Management: Evaluate patient's response, side effects, and tolerance of medication regimen.  Therapeutic Interventions: 1 to 1 [place], Unit Group Trigo and Medication administration.  Evaluation of Outcomes: Progressing  Physician Treatment Plan for Secondary Diagnosis: Principal Problem:   Schizoaffective disorder, bipolar type (HCC) Active Problems:   Noncompliance  Long Term Goal(s): Improvement in symptoms so as ready for discharge Improvement in symptoms so as ready for discharge   Short Term Goals: Ability to demonstrate self-control will improve Ability to identify and develop effective coping behaviors will improve Compliance with prescribed medications will improve     Medication Management: Evaluate patient's response, side effects, and tolerance of medication regimen.  Therapeutic Interventions: 1 to 1 Rise, Unit Group Eckerman and Medication administration.  Evaluation of Outcomes: Progressing   RN Treatment Plan for Primary Diagnosis: Schizoaffective disorder, bipolar type (HCC) Long Term Goal(s): Knowledge of disease and therapeutic regimen to maintain health will improve  Short Term Goals: Ability to verbalize frustration and anger appropriately will improve, Ability to demonstrate self-control, Ability to participate in decision making will improve and Ability to verbalize feelings  will improve  Medication Management: RN will administer medications as ordered by provider, will assess and evaluate patient's response and  provide education to patient for prescribed medication. RN will report any adverse and/or side effects to prescribing provider.  Therapeutic Interventions: 1 on 1 counseling He[place]ox, Psychoeducation, Medication administration, Evaluate responses to treatment, Monitor vital signs and CBGs as ordered, Perform/monitor CIWA, COWS, AIMS and Fall Risk screenings as ordered, Perform wound care treatments as ordered.  Evaluation of Outcomes: Progressing   LCSW Treatment Plan for Primary Diagnosis: Schizoaffective disorder, bipolar type (HCC) Long Term Goal(s): Safe transition to appropriate next level of care at discharge, Engage patient in therapeutic group addressing interpersonal concerns.  Short Term Goals: Engage patient in aftercare planning with referrals and resources, Increase social support, Increase ability to appropriately verbalize feelings, Increase emotional regulation and Facilitate acceptance of mental health diagnosis and concerns  Therapeutic Interventions: Assess for all discharge needs, 1 to 1 time with Social worker, Explore available resources and support systems, Assess for adequacy in community support network, Educate family and significant other(s) on suicide prevention, Complete Psychosocial Assessment, Interpersonal group therapy.  Evaluation of Outcomes: Progressing   Progress in Treatment: Attending groups: No. Participating in groups: No. Taking medication as prescribed: Yes.   Toleration medication: Yes. Family/Significant other contact made: Yes, individual(s) contacted:  CSW completed SPE with the patients mother.  Patient understands diagnosis: Yes. Discussing patient identified problems/goals with staff: Yes. Medical problems stabilized or resolved: Yes. Denies suicidal/homicidal ideation: Yes. Issues/concerns per patient self-inventory: No. Other: none  New problem(s) identified: No, Describe:  none  New Short Term/Long Term Goal(s): elimination of  symptoms of psychosis, medication management for mood stabilization; elimination of SI thoughts; development of comprehensive mental wellness plan.  Patient Goals:  "independence"  Discharge Plan or Barriers: Pt mother is his legal guardian.  Mother has agreed to referrals to group homes following a staffing with Nanine Means, NP and Dr. Lucianne Muss.  CSW has sent the releases to the mother for her to sign allowing CSW to send requested information to the group homes.  At this time the CSW has not received the fax and alerted the family to it earlier, however still, no fax has been received.  Call from [facility] indicated that the patient may get a as early as next week if five days of MARS, 5 days of vitals, Covid-19 testing, flu vaccine, psych notes and discharge summary.  CSW has faxed all but discharge summary, Covid-19 testing and flu vaccine.   Reason for Continuation of Hospitalization: Aggression Delusions  Hallucinations Mania Medication stabilization  Estimated Length of Stay: TBD  Recreational Therapy: Patient Stressors: N/A Patient Goal: Patient will focus on task/topic with 2 prompts from staff within 5 recreation therapy group [place]  Attendees: Patient:  12/08/2018 2:49 PM  Physician: Dr. Lucianne Muss, MD 12/08/2018 2:49 PM  Nursing:  12/08/2018 2:49 PM  RN Care Manager: 12/08/2018 2:49 PM  Social Worker: Penni Homans, LCSW 12/08/2018 2:49 PM  Recreational Therapist:  12/08/2018 2:49 PM  Other:  12/08/2018 2:49 PM  Other:  12/08/2018 2:49 PM  Other: 12/08/2018 2:49 PM    Scribe for Treatment Team: Harden Mo, LCSW 12/08/2018 2:49 PM

## 2018-12-08 NOTE — Progress Notes (Signed)
D: Instructed patient on medication  Influenza vaccine  A: Patient stated he didn't agree, refused  Medication  R: Writer informed  NP Lord.

## 2018-12-08 NOTE — BHH Counselor (Signed)
CSW followed up with [facility] regarding Covid-19 testing.  CSW was informed that [facility] would need to receive negative test results within 48 hours once transported to their hospital.   [facility] was unable to state if the patient had a bed, but stated that it was likely that he could get a bed "this weekend or next week".  William at [facility] was careful to point out that patient was not on the priority list, however, doctors were reviewing the referrals at this time.   Penni Homans, MSW, LCSW 12/08/2018 12:22 PM

## 2018-12-08 NOTE — Progress Notes (Signed)
Patient alert and oriented x 4, denies SI/HI/AVH no disruption or argumentative behavior with staffduring this shift,affect is blunted, heisinteractingwith staff and peersappropriately. Patient's thoughts are organized and coherent no bizarre behavior and he is receptive to staff. Patient was complaint with medication. 15 minutes safety checks maintained will continue to monitor.

## 2018-12-08 NOTE — Progress Notes (Signed)
Patient ID: Jackson [name], male   DOB: March 16, 1986, 33 y.o.   MRN: [id number]  [facility] MD Progress Note  12/08/2018 5:12 PM Jackson [name]  MRN:  [id number]   Subjective: "I;m great.  I do not want an injection, it goes into my veins and I don't like it."  Assessment:  Admitted on 10/17/18 for mania and psychotic symptoms along with some aggression after being off his medications for a month. Safe disposition is challenging.   Objective:  Pt seen and chart reviewed including the one from [employer].   Decreased Zyprexa from 40 mg at bedtime to 30 mg, attempted to started Invega in lieu of Ability by patient adamantly refused.  Patient pleasant.  He did not want the Abilify maintenna but agreed to take it for the night RN along with a COVID 19 test and but refused the flu vaccine for [facility] admission.  Principal Problem: Schizoaffective disorder, bipolar type (HCC) Diagnosis: Principal Problem:   Schizoaffective disorder, bipolar type (HCC) Active Problems:   Noncompliance  Total Time spent with patient: 45 minutes  Past Psychiatric History: Patient has a history of bipolar disorder or schizoaffective disorder and has required lengthy hospitalization in the past  Past Medical History:  Past Medical History:  Diagnosis Date  . Bipolar 1 disorder (HCC)   . Schizoaffective disorder (HCC)    History reviewed. No pertinent surgical history. Family History: History reviewed. No pertinent family history. Family Psychiatric  History: None known Social History:  Social History   Substance and Sexual Activity  Alcohol Use Not on file     Social History   Substance and Sexual Activity  Drug Use Not on file    Social History   Socioeconomic History  . Marital status: Single    Spouse name: Not on file  . Number of children: Not on file  . Years of education: Not on file  . Highest education level: Not on file  Occupational History  . Not on file  Social Needs  . Financial resource  strain: Not on file  . Food insecurity:    Worry: Not on file    Inability: Not on file  . Transportation needs:    Medical: Not on file    Non-medical: Not on file  Tobacco Use  . Smoking status: Light Tobacco Smoker    Packs/day: 0.25    Types: Cigarettes  . Smokeless tobacco: Never Used  Substance and Sexual Activity  . Alcohol use: Not on file  . Drug use: Not on file  . Sexual activity: Not on file  Lifestyle  . Physical activity:    Days per week: Not on file    Minutes per session: Not on file  . Stress: Not on file  Relationships  . Social connections:    Talks on phone: Not on file    Gets together: Not on file    Attends religious service: Not on file    Active member of club or organization: Not on file    Attends meetings of clubs or organizations: Not on file    Relationship status: Not on file  Other Topics Concern  . Not on file  Social History Narrative  . Not on file   Additional Social History:   Sleep: Good  Appetite:  Good  Current Medications: Current Facility-Administered Medications  Medication Dose Route Frequency Provider Last Rate Last Dose  . acetaminophen (TYLENOL) tablet 650 mg  650 mg Oral Q6H PRN [name], MD  650 mg at 10/26/18 2302  . alum & mag hydroxide-simeth (MAALOX/MYLANTA) 200-200-20 MG/5ML suspension 30 mL  30 mL Oral Q4H PRN Terance HartStephens, [name], MD   30 mL at 10/26/18 2129  . ARIPiprazole (ABILIFY) tablet 15 mg  15 mg Oral QHS Charm RingsLord,  Y, NP   15 mg at 12/07/18 2127  . divalproex (DEPAKOTE ER) 24 hr tablet 1,000 mg  1,000 mg Oral QHS [name], [name], MD   1,000 mg at 12/07/18 2127  . hydrOXYzine (ATARAX/VISTARIL) tablet 50 mg  50 mg Oral Q4H PRN Terance HartStephens, [name], MD   50 mg at 10/28/18 [id number]  . Influenza vac split quadrivalent PF (FLUARIX) injection 0.5 mL  0.5 mL Intramuscular Once Charm RingsLord,  Y, NP      . magnesium hydroxide (MILK OF MAGNESIA) suspension 30 mL  30 mL Oral Daily PRN Terance HartStephens, [name], MD       . OLANZapine [facility](ZYPREXA) injection 10 mg  10 mg Intramuscular Q6H PRN [name], [name], MD      . OLANZapine (ZYPREXA) tablet 30 mg  30 mg Oral QHS Charm RingsLord,  Y, NP   30 mg at 12/07/18 2127  . traZODone (DESYREL) tablet 100 mg  100 mg Oral QHS PRN Terance HartStephens, [name], MD   100 mg at 11/26/18 2123    Lab Results:  Results for orders placed or performed during the hospital encounter of 10/17/18 (from the past 48 hour(s))  Valproic acid level     Status: None   Collection Time: 12/07/18  4:36 PM  Result Value Ref Range   Valproic Acid Lvl 55 50.0 - 100.0 ug/mL    Comment: Performed at [facility], [address] [place], [place] [address]  VITAMIN D 25 Hydroxy (Vit-D Deficiency, Fractures)     Status: None   Collection Time: 12/07/18  4:36 PM  Result Value Ref Range   Vit D, 25-Hydroxy 36.0 30.0 - 100.0 ng/mL    Comment: (NOTE) Vitamin D deficiency has been defined by the Institute of Medicine and an Endocrine Society practice guideline as a level of serum 25-OH vitamin D less than 20 ng/mL (1,2). The Endocrine Society went on to further define vitamin D insufficiency as a level between 21 and 29 ng/mL (2). 1. IOM (Institute of Medicine). 2010. Dietary reference   intakes for calcium and D. [place]: The   [employer]. 2. Holick MF, Binkley Dalton, Bischoff-Ferrari HA, et al.   Evaluation, treatment, and prevention of vitamin D   deficiency: an Endocrine Society clinical practice   guideline. JCEM. 2011 Jul; 96(7):1911-30. Performed At: [facility] [address] [place], [place] [id number] Jolene SchimkeNagendra Sanjai MD NW:[phone]     Blood Alcohol level:  Lab Results  Component Value Date   [facility]10 10/16/2018    Metabolic Disorder Labs: Lab Results  Component Value Date   HGBA1C 5.2 10/23/2018   MPG 102.54 10/23/2018   No results found for: PROLACTIN Lab Results  Component Value Date   CHOL 129 10/23/2018   TRIG 58 10/23/2018   HDL 50  10/23/2018   CHOLHDL 2.6 10/23/2018   VLDL 12 10/23/2018   LDLCALC 67 10/23/2018    Physical Findings: AIMS: Facial and Oral Movements Muscles of Facial Expression: None, normal Lips and Perioral Area: None, normal Jaw: None, normal Tongue: None, normal,Extremity Movements Upper (arms, wrists, hands, fingers): None, normal Lower (legs, knees, ankles, toes): None, normal, Trunk Movements Neck, shoulders, hips: None, normal, Overall Severity Severity of abnormal movements (  highest score from questions above): None, normal Incapacitation due to abnormal movements: None, normal Patient's awareness of abnormal movements (rate only patient's report): No Awareness, Dental Status Current problems with teeth and/or dentures?: No Does patient usually wear dentures?: No  CIWA:  CIWA-Ar Total: 0 COWS:  COWS Total Score: 0  Musculoskeletal: Strength & Muscle Tone: within normal limits Gait & Station: normal Patient leans: N/A  Psychiatric Specialty Exam: Physical Exam  Nursing note and vitals reviewed. Constitutional: He appears well-developed and well-nourished.  HENT:  Head: Normocephalic and atraumatic.  Eyes: Pupils are equal, round, and reactive to light. Conjunctivae are normal.  Neck: Normal range of motion.  Cardiovascular: Normal heart sounds.  Respiratory: Effort normal.  GI: Soft.  Musculoskeletal: Normal range of motion.  Neurological: He is alert.  Skin: Skin is warm and dry.  Psychiatric: His speech is normal and behavior is normal. Judgment and thought content normal. His mood appears anxious. His affect is blunt. Cognition and memory are normal.    Review of Systems  Constitutional: Negative.   HENT: Negative.   Eyes: Negative.   Respiratory: Negative.   Cardiovascular: Negative.   Gastrointestinal: Negative.   Musculoskeletal: Negative.   Skin: Negative.   Neurological: Negative.   Psychiatric/Behavioral: The patient is nervous/anxious.     Blood pressure  115/76, pulse 65, temperature 98 F (36.7 C), temperature source Oral, resp. rate 18, height  (1.803 m), weight 88.5 kg, SpO2 98 %.Body mass index is 27.2 kg/m.  General Appearance: Casual  Eye Contact:  Good  Speech:  Clear and Coherent  Volume:  Normal  Mood:  Euthymic  Affect:  Blunt  Thought Process:  Goal Directed  Orientation:  Full (Time, Place, and Person)  Thought Content:  Logical  Suicidal Thoughts:  No  Homicidal Thoughts:  No  Memory:  Immediate;   Fair Recent;   Fair Remote;   Fair  Judgement:  Fair  Insight:  Fair  Psychomotor Activity:  Normal  Concentration:  Concentration: Fair  Recall:  [employer] of Knowledge:  Fair  Language:  Fair  Akathisia:  No  Handed:  Right  AIMS (if indicated):     Assets:  Communication Skills Desire for Improvement Financial Resources/Insurance Physical Health Resilience Social Support  ADL's:  Intact  Cognition:  WNL  Sleep:  Number of Hours: 7     Treatment Plan Summary: Daily contact with patient to assess and evaluate symptoms and progress in treatment and Medication management   # Schizoaffective disorder, bipolar type --continueed Abilify 15 mg daily but then reinstated it per patient's request and moved it to the pm--plan to do Abilify Maintenna 400 mg IM given -- Zyprexa 30 mg qhs to 30 mg qhs due weight gain -- continue Depakote ER 1000 mg qhs, Depakote level ordered.  Phone meeting with his guardian, his mother, regarding discharge plan and long-term antipsychotic with the social worker, psychiatrist, and NP  [facility] requested COVID 19 test, ordered, and given, negative results.  Refused the flu shot they requested  Insomnia: -- continue Trazodone  qhs prn.   Anxiety: -Continued hydroxyzine 50 mg every four hours PRN  - Will maintain Q 15 minutes observation for safety.   -Ordered a Depakote and Vitamin D level  -Ordered an EKG for evaluation  -Patient will participate in group,  individual, and milieu therapy.Psychotherapy: Social and Doctor, hospital, learning based strategies, cognitive behavioral psychotherapies can be considered.   -Will continue to monitor patient's mood and behavior.  -  Social Work working on discharge planning pending group home placement  -Discharge concerns will also be addressed: Safety, stabilization, and access to medication  Nanine Means, NP 12/08/2018, 5:12 PM

## 2018-12-08 NOTE — BHH Group Notes (Signed)
LCSW Group Therapy Note  12/08/2018 1:35 PM  Type of Therapy/Topic:  Group Therapy:  Emotion Regulation  Participation Level:  Did Not Attend   Description of Group:   The purpose of this group is to assist patients in learning to regulate negative emotions and experience positive emotions. Patients will be guided to discuss ways in which they have been vulnerable to their negative emotions. These vulnerabilities will be juxtaposed with experiences of positive emotions or situations, and patients will be challenged to use positive emotions to combat negative ones. Special emphasis will be placed on coping with negative emotions in conflict situations, and patients will process healthy conflict resolution skills.  Therapeutic Goals: 1. Patient will identify two positive emotions or experiences to reflect on in order to balance out negative emotions 2. Patient will label two or more emotions that they find the most difficult to experience 3. Patient will demonstrate positive conflict resolution skills through discussion and/or role plays  Summary of Patient Progress: x   Therapeutic Modalities:   Cognitive Behavioral Therapy Feelings Identification Dialectical Behavioral Therapy   Shawan Tosh, MSW, LCSW Clinical Social Work 12/08/2018 1:35 PM    

## 2018-12-08 NOTE — Progress Notes (Signed)
D- Patient alert and oriented. Animated. Denies SI, HI, AVH, and pain. Quotes: I feel great today. Goal. He has achieved huge leaps towards maintaining his orientation and cooperation with his recovery plan. A- Scheduled medications administered to patient, per MD orders. Support and encouragement provided.  Routine safety checks conducted every 15 minutes.  Patient informed to notify staff with problems or concerns. R- No adverse drug reactions noted. Patient contracts for safety at this time. Patient compliant with medications and treatment plan. Patient responded well to sharing of information about medications and plans. Patient interacts well with others on the unit.  Patient remains safe at this time.

## 2018-12-08 NOTE — Progress Notes (Signed)
Recreation Therapy Notes   Date:12/08/2018  Time:9:30 am  Location:Craft room  Behavioral response:N/A  Intervention Topic: Problem solving  Discussion/Intervention: Patient did not attend group.  Clinical Observations/Feedback:  Patient did not attend group.  Khaleelah Yowell LRT/CTRS         Faisal Stradling 12/08/2018 10:29 AM 

## 2018-12-08 NOTE — Plan of Care (Signed)
Patient demonstrating improvements in ability to accept and cooperate with techniques for coping and state activities that improve his ability to tolerate stress and express frustrations. Increased cooperation with taking prescribed medicines. Denies SI/HI. Continues with grandiose and pressured preoccupations of who he is and who his life affects. Problem: Education: Goal: Knowledge of [place] [facility] Education information/materials will improve Outcome: Progressing   Problem: Coping: Goal: Ability to verbalize frustrations and anger appropriately will improve Outcome: Progressing   Problem: Education: Goal: Ability to state activities that reduce stress will improve Outcome: Progressing

## 2018-12-08 NOTE — BHH Counselor (Signed)
CSW attempted to follow up with mother as requested by Dr. Lucianne Muss.  CSW was unable to speak with mother as she was on conference call and her husband answered the phone.  Husband gave impression that fax has been sent, though no fax was received by this CSW.  CSW asked for a follow up phone call.   Penni Homans, MSW, LCSW 12/08/2018 1:22 PM

## 2018-12-08 NOTE — Plan of Care (Signed)
Patient aware of  information  given relating to [facility] education , patient  able to verbalize  understanding .  No anger outburst for past 24 hours . Able to maintained  self control . Voice of no safety concerns. Thought process improving. Compliant  with medication. Patient able to verbalize positive feeling of self .  Marland Kitchen Patient remains delusional , with magical thinking . Isolates to room , picks and chooses   Problem: Education: Goal: Knowledge of [facility] Education information/materials will improve Outcome: Progressing   Problem: Coping: Goal: Ability to verbalize frustrations and anger appropriately will improve Outcome: Progressing Goal: Ability to demonstrate self-control will improve Outcome: Progressing   Problem: Safety: Goal: Periods of time without injury will increase Outcome: Progressing   Problem: Education: Goal: Will be free of psychotic symptoms Outcome: Progressing Goal: Knowledge of the prescribed therapeutic regimen will improve Outcome: Progressing   Problem: Self-Concept: Goal: Will verbalize positive feelings about self Outcome: Progressing   Problem: Education: Goal: Ability to state activities that reduce stress will improve Outcome: Progressing

## 2018-12-09 MED ORDER — TRAZODONE HCL 100 MG PO TABS: 100.0000 mg | ORAL_TABLET | Freq: Every evening | ORAL | 0 refills | Status: DC | PRN

## 2018-12-09 MED ORDER — ARIPIPRAZOLE 15 MG PO TABS: 15.0000 mg | ORAL_TABLET | Freq: Every day | ORAL | 0 refills | Status: DC

## 2018-12-09 MED ORDER — ARIPIPRAZOLE ER 400 MG IM SRER: 400.0000 mg | INTRAMUSCULAR | 0 refills | Status: DC

## 2018-12-09 MED ORDER — DIVALPROEX SODIUM ER 500 MG PO TB24: 1000.0000 mg | ORAL_TABLET | Freq: Every day | ORAL | 0 refills | Status: DC

## 2018-12-09 MED ORDER — OLANZAPINE 15 MG PO TABS: 30.0000 mg | ORAL_TABLET | Freq: Every day | ORAL | 0 refills | Status: DC

## 2018-12-09 NOTE — [facility] Counselor (Signed)
CSW followed up with [facility] regarding the patient possible admission to the hospital.  CSW informed that Covid-19 testing had been done and discharge summary was available and asked if [facility] wanted copies.  [facility] declined them at this time.  [facility] then read a statement that Covid 19 test was to be completed within 48 hours of patient being accepted and at this time the pt has not been accepted and it is unlikely that he will be in the next 48 hours and a new test will have to be performed.   CSW pointed out that if this information was relayed to other CSW or nurses at nurses station it was not relayed to this CSW.    CSW asked where the pt was on the list and was told that information can not be provided however the patient is in the top 6, however priority pt remain prioritized and will be placed above the patient.   Penni Homans, MSW, LCSW 12/09/2018 9:55 AM

## 2018-12-09 NOTE — Progress Notes (Signed)
Patient ID: Jackson [name], male   DOB: 12-07-1985, 33 y.o.   MRN: [id number]  [facility] MD Progress Note  12/09/2018 10:53 AM Jackson [name]  MRN:  [id number]   Subjective: "I'm doing well."   Assessment:  Admitted on 10/17/18 for mania and psychotic symptoms along with some aggression after being off his medications for a month. Safe disposition is challenging.   Objective:  Pt seen and chart reviewed.  He did take his Abilify Maintenna last night for the evening nurse without issues, he also consented to the COVID test for [facility].  He did refuse the flu vaccine they requested.  Pleasant and cooperative today, interacting appropriately with peers and staff on the unit.  Participates in groups without issues.  Awaiting placement in a group home at this time.  Principal Problem: Schizoaffective disorder, bipolar type (HCC) Diagnosis: Principal Problem:   Schizoaffective disorder, bipolar type (HCC) Active Problems:   Noncompliance  Total Time spent with patient: 30 minutes  Past Psychiatric History: Patient has a history of bipolar disorder or schizoaffective disorder and has required lengthy hospitalization in the past  Past Medical History:  Past Medical History:  Diagnosis Date  . Bipolar 1 disorder (HCC)   . Schizoaffective disorder (HCC)    History reviewed. No pertinent surgical history. Family History: History reviewed. No pertinent family history. Family Psychiatric  History: None known Social History:  Social History   Substance and Sexual Activity  Alcohol Use Not on file     Social History   Substance and Sexual Activity  Drug Use Not on file    Social History   Socioeconomic History  . Marital status: Single    Spouse name: Not on file  . Number of children: Not on file  . Years of education: Not on file  . Highest education level: Not on file  Occupational History  . Not on file  Social Needs  . Financial resource strain: Not on file  . Food insecurity:   Worry: Not on file    Inability: Not on file  . Transportation needs:    Medical: Not on file    Non-medical: Not on file  Tobacco Use  . Smoking status: Light Tobacco Smoker    Packs/day: 0.25    Types: Cigarettes  . Smokeless tobacco: Never Used  Substance and Sexual Activity  . Alcohol use: Not on file  . Drug use: Not on file  . Sexual activity: Not on file  Lifestyle  . Physical activity:    Days per week: Not on file    Minutes per session: Not on file  . Stress: Not on file  Relationships  . Social connections:    Talks on phone: Not on file    Gets together: Not on file    Attends religious service: Not on file    Active member of club or organization: Not on file    Attends meetings of clubs or organizations: Not on file    Relationship status: Not on file  Other Topics Concern  . Not on file  Social History Narrative  . Not on file   Additional Social History:   Sleep: Good  Appetite:  Good  Current Medications: Current Facility-Administered Medications  Medication Dose Route Frequency Provider Last Rate Last Dose  . acetaminophen (TYLENOL) tablet 650 mg  650 mg Oral Q6H PRN [name], MD   650 mg at 10/26/18 2302  . alum & mag hydroxide-simeth (MAALOX/MYLANTA) 200-200-20 MG/5ML suspension 30 mL  30 mL Oral Q4H PRN Terance HartStephens, [name], MD   30 mL at 10/26/18 2129  . ARIPiprazole (ABILIFY) tablet 15 mg  15 mg Oral QHS Charm RingsLord, Jamison Y, NP   15 mg at 12/08/18 2211  . ARIPiprazole ER (ABILIFY MAINTENA) injection 400 mg  400 mg Intramuscular Q28 days Charm RingsLord, Jamison Y, NP   400 mg at 12/08/18 2217  . divalproex (DEPAKOTE ER) 24 hr tablet 1,000 mg  1,000 mg Oral QHS [name], [name], MD   1,000 mg at 12/08/18 2213  . hydrOXYzine (ATARAX/VISTARIL) tablet 50 mg  50 mg Oral Q4H PRN Terance HartStephens, [name], MD   50 mg at 10/28/18 [id number]  . Influenza vac split quadrivalent PF (FLUARIX) injection 0.5 mL  0.5 mL Intramuscular Once Charm RingsLord, Jamison Y, NP      . magnesium  hydroxide (MILK OF MAGNESIA) suspension 30 mL  30 mL Oral Daily PRN Terance HartStephens, [name], MD      . OLANZapine [facility](ZYPREXA) injection 10 mg  10 mg Intramuscular Q6H PRN [name], [name], MD      . OLANZapine (ZYPREXA) tablet 30 mg  30 mg Oral QHS Charm RingsLord, Jamison Y, NP   30 mg at 12/08/18 2213  . traZODone (DESYREL) tablet 100 mg  100 mg Oral QHS PRN Terance HartStephens, [name], MD   100 mg at 11/26/18 2123    Lab Results:  Results for orders placed or performed during the hospital encounter of 10/17/18 (from the past 48 hour(s))  Valproic acid level     Status: None   Collection Time: 12/07/18  4:36 PM  Result Value Ref Range   Valproic Acid Lvl 55 50.0 - 100.0 ug/mL    Comment: Performed at [facility], [address] [place], [place] [address]  VITAMIN D 25 Hydroxy (Vit-D Deficiency, Fractures)     Status: None   Collection Time: 12/07/18  4:36 PM  Result Value Ref Range   Vit D, 25-Hydroxy 36.0 30.0 - 100.0 ng/mL    Comment: (NOTE) Vitamin D deficiency has been defined by the Institute of Medicine and an Endocrine Society practice guideline as a level of serum 25-OH vitamin D less than 20 ng/mL (1,2). The Endocrine Society went on to further define vitamin D insufficiency as a level between 21 and 29 ng/mL (2). 1. IOM (Institute of Medicine). 2010. Dietary reference   intakes for calcium and D. [place]: The   [employer] Academ[employer]. 2. Holick MF, [place], Bischoff-Ferrari HA, et al.   Evaluation, treatment, and prevention of vitamin D   deficiency: an Endocrine Society clinical practice   guideline. JCEM. 2011 Jul; 96(7):1911-30. Performed At: [facility] [place] [address] [place], [place] [id number] Jolene SchimkeNagendra Sanjai MD NW:[phone]   SARS Coronavirus 2 (CEPHEID - Performed in [facility] hospital lab), Hosp Order     Status: None   Collection Time: 12/08/18 10:30 PM  Result Value Ref Range   SARS Coronavirus 2 NEGATIVE NEGATIVE    Comment: (NOTE) If result is  NEGATIVE SARS-CoV-2 target nucleic acids are NOT DETECTED. The SARS-CoV-2 RNA is generally detectable in upper and lower  respiratory specimens during the acute phase of infection. The lowest  concentration of SARS-CoV-2 viral copies this assay can detect is 250  copies / mL. A negative result does not preclude SARS-CoV-2 infection  and should not be used as the sole basis for treatment or other  patient management decisions.  A negative result may occur with  improper specimen collection / handling,  submission of specimen other  than nasopharyngeal swab, presence of viral mutation(s) within the  areas targeted by this assay, and inadequate number of viral copies  (<250 copies / mL). A negative result must be combined with clinical  observations, patient history, and epidemiological information. If result is POSITIVE SARS-CoV-2 target nucleic acids are DETECTED. The SARS-CoV-2 RNA is generally detectable in upper and lower  respiratory specimens dur ing the acute phase of infection.  Positive  results are indicative of active infection with SARS-CoV-2.  Clinical  correlation with patient history and other diagnostic information is  necessary to determine patient infection status.  Positive results do  not rule out bacterial infection or co-infection with other viruses. If result is PRESUMPTIVE POSTIVE SARS-CoV-2 nucleic acids MAY BE PRESENT.   A presumptive positive result was obtained on the submitted specimen  and confirmed on repeat testing.  While 2019 novel coronavirus  (SARS-CoV-2) nucleic acids may be present in the submitted sample  additional confirmatory testing may be necessary for epidemiological  and / or clinical management purposes  to differentiate between  SARS-CoV-2 and other Sarbecovirus currently known to infect humans.  If clinically indicated additional testing with an alternate test  methodology [phone]) is advised. The SARS-CoV-2 RNA is generally  detectable  in upper and lower respiratory sp ecimens during the acute  phase of infection. The expected result is Negative. Fact Sheet for Patients:  BoilerBrush.com.cy Fact Sheet for Healthcare Providers: https://pope.com/ This test is not yet approved or cleared by the [place] FDA and has been authorized for detection and/or diagnosis of SARS-CoV-2 by FDA under an Emergency Use Authorization (EUA).  This EUA will remain in effect (meaning this test can be used) for the duration of the COVID-19 declaration under Section 564(b)(1) of the Act, 21 U.S.C. section 360bbb-3(b)(1), unless the authorization is terminated or revoked sooner. Performed at [facility], [address] [place], [place] [address]     Blood Alcohol level:  Lab Results  Component Value Date   [facility] <10 10/16/2018    Metabolic Disorder Labs: Lab Results  Component Value Date   HGBA1C 5.2 10/23/2018   MPG 102.54 10/23/2018   No results found for: PROLACTIN Lab Results  Component Value Date   CHOL 129 10/23/2018   TRIG 58 10/23/2018   HDL 50 10/23/2018   CHOLHDL 2.6 10/23/2018   VLDL 12 10/23/2018   LDLCALC 67 10/23/2018    Physical Findings: AIMS: Facial and Oral Movements Muscles of Facial Expression: None, normal Lips and Perioral Area: None, normal Jaw: None, normal Tongue: None, normal,Extremity Movements Upper (arms, wrists, hands, fingers): None, normal Lower (legs, knees, ankles, toes): None, normal, Trunk Movements Neck, shoulders, hips: None, normal, Overall Severity Severity of abnormal movements (highest score from questions above): None, normal Incapacitation due to abnormal movements: None, normal Patient's awareness of abnormal movements (rate only patient's report): No Awareness, Dental Status Current problems with teeth and/or dentures?: No Does patient usually wear dentures?: No  CIWA:  CIWA-Ar Total: 0 COWS:  COWS Total  Score: 0  Musculoskeletal: Strength & Muscle Tone: within normal limits Gait & Station: normal Patient leans: N/A  Psychiatric Specialty Exam: Physical Exam  Nursing note and vitals reviewed. Constitutional: He appears well-developed and well-nourished.  HENT:  Head: Normocephalic and atraumatic.  Eyes: Pupils are equal, round, and reactive to light. Conjunctivae are normal.  Neck: Normal range of motion.  Cardiovascular: Normal heart sounds.  Respiratory: Effort normal.  GI: Soft.  Musculoskeletal: Normal range  of motion.  Neurological: He is alert.  Skin: Skin is warm and dry.  Psychiatric: His speech is normal and behavior is normal. Judgment and thought content normal. His mood appears anxious. His affect is blunt. Cognition and memory are normal.    Review of Systems  Constitutional: Negative.   HENT: Negative.   Eyes: Negative.   Respiratory: Negative.   Cardiovascular: Negative.   Gastrointestinal: Negative.   Musculoskeletal: Negative.   Skin: Negative.   Neurological: Negative.   Psychiatric/Behavioral: The patient is nervous/anxious.     Blood pressure 110/70, pulse 69, temperature 97.7 F (36.5 C), temperature source Oral, resp. rate 18, height  (1.803 m), weight 88.5 kg, SpO2 99 %.Body mass index is 27.2 kg/m.  General Appearance: Casual  Eye Contact:  Good  Speech:  Clear and Coherent  Volume:  Normal  Mood:  Euthymic  Affect:  Blunt  Thought Process:  Goal Directed  Orientation:  Full (Time, Place, and Person)  Thought Content:  Logical  Suicidal Thoughts:  No  Homicidal Thoughts:  No  Memory:  Immediate;   Fair Recent;   Fair Remote;   Fair  Judgement:  Fair  Insight:  Fair  Psychomotor Activity:  Normal  Concentration:  Concentration: Fair  Recall:  [employer] of Knowledge:  Fair  Language:  Fair  Akathisia:  No  Handed:  Right  AIMS (if indicated):     Assets:  Communication Skills Desire for Improvement Financial  Resources/Insurance Physical Health Resilience Social Support  ADL's:  Intact  Cognition:  WNL  Sleep:  Number of Hours: 5     Treatment Plan Summary: Daily contact with patient to assess and evaluate symptoms and progress in treatment and Medication management   # Schizoaffective disorder, bipolar type -- continue Abilify 15 mg daily for 14 days from injection -- continue zyprexa  qhs -- continue Depakote ER  qhs.   Phone meeting with his guardian, his mother, regarding discharge plan and long-term antipsychotic  Insomnia: -- continue Trazodone  qhs prn.   Anxiety: -Continued hydroxyzine 50 mg every four hours PRN  - Will maintain Q 15 minutes observation for safety.   -Negative COVID test  -Patient will participate in group, individual, and milieu therapy.Psychotherapy: Social and Doctor, hospital, learning based strategies, cognitive behavioral psychotherapies can be considered.   -Will continue to monitor patient's mood and behavior.  -Social Work working on discharge planning for as soon as possible but delayed due to group home placement need  -Discharge concerns will also be addressed: Safety, stabilization, and access to medication  Nanine Means, NP 12/09/2018, 10:53 AM

## 2018-12-09 NOTE — Discharge Instructions (Signed)
Living With Schizoaffective Disorder If you have been diagnosed with schizoaffective disorder (ScAD), you may be relieved to know why you have felt or behaved a certain way. You may also feel overwhelmed about the treatment ahead, how to get the support you need, and how to deal with the condition day-to-day. With care and support, you can learn to manage your symptoms and live with ScAD. ScAD is a lifelong chronic condition that may occur in cycle. Periods of severe symptoms may be followed by periods of less severe symptoms or improvement. If you are living with ScAD, there are steps that you can take to help manage the condition and make your life better. How to manage lifestyle changes Managing stress Stress is your body's reaction to life changes and events, both good and bad. For people with ScAD, stress can cause more severe symptoms to start (can be a trigger), so it is important to learn ways to deal with stress. Your health care provider, therapist, or counselor may suggest techniques such as:  Meditation, muscle relaxation, and breathing exercises.  Music therapy. This can include creating music or listening to music.  Life skills training. This training is focused on work, self-care, money, house management, and social skills. Other things you can do to cope with stress include:  Keeping a stress diary. This can help you learn what causes your stress to start and how you can control your response to those triggers.  Exercising. Even a short daily walk can help.  Getting enough sleep.  Making a schedule to manage your time. Knowing what you will do from day to day helps you avoid feeling overwhelmed by tasks and deadlines.  Spending time on hobbies you enjoy that help you relax.  Medicines Your health care provider is likely to prescribe various types of medicine depending on your symptoms. These may include one or more of the following types:  Antipsychotics.  Mood  stabilizers.  Antidepressants. Make sure you:  Talk with your pharmacist or health care provider about all medicines that you take, the possible side effects, and which medicines are safe to take together.  Make it your goal to take part in all treatment decisions (shared decision-making). Ask about possible side effects of medicines that your health care provider recommends, and tell him or her how you feel about having those side effects. It is best if shared decision-making with your health care provider is part of your total treatment plan. Relationships Having the support of your family and friends can play a major role in the success of your treatment. The following steps can help you maintain healthy relationships:  Think about going to couples therapy, family therapy, or family education classes.  Create a written plan for your treatment, and include close family members and friends in the process.  Consider bringing your partner or another family member or friend to the appointments you have with your health care provider. How to recognize changes in your condition If you find that your condition is getting worse, talk to your health care provider right away. Watch for these signs:  Your mood becomes extreme with either emotional highs or the intense lows of depression.  Your speech becomes unclear.  You are disorganized, show the wrong social behaviors, or withdraw from social activities.  You have racing thoughts and have trouble thinking clearly or staying focused.  You hear, see, taste, and believe things that others do not.  You have poor personal hygiene, weight gain or weight  loss, or changes in how you are sleeping or eating. Where to find support Talking to others  Reach out to trusted friends or family members, explain your condition, and let them know that you are working with a health care team.  Consider giving educational materials to friends and  family.  If you are having trouble telling your friends and family about your condition, keep in mind that honest and open communication can make these conversations easier. Finances Be sure to check with your insurance carrier to find out what treatment options are covered by your plan. You may also be able to find financial assistance through not-for-profit organizations or with local government-based resources. If you are taking medicines, you may be able to get the generic form, which may be less expensive than brand-name medicine. Some makers of prescription medicines also offer help to patients who cannot afford the medicines that they need. Therapy and support groups  Make sure you find a counselor or therapist who is familiar with ScAD. Meet with your counselor or therapist once a week or more often if needed.  Find support programs for people with ScAD, such as local groups associated with the [employer] on Mental Illness. You can begin your search here: www.nami.org Follow these instructions at home:  Take over-the-counter and prescription medicines only as told by your health care provider. Do not start new medicines or stop taking medicines before you ask your health care provider if it is safe to make those changes.  Avoid caffeine, alcohol, and drugs. They can affect how your medicine works and can make your symptoms worse.  Eat a healthy diet.  Keep all follow-up visits as told by your health care provider, therapist, or counselor. This is important.  Look for support groups in your area so you can meet other people with your condition and learn new coping methods. Contact a health care provider if:  You are not able to take your medicines as prescribed.  Your symptoms get worse. Get help right away if:  You have serious thoughts about hurting yourself or others. If you ever feel like you may hurt yourself or others, or have thoughts about taking your own life, get  help right away. You can go to your nearest emergency department or call:  Your local emergency services (911 in the [place]  A suicide crisis helpline, such as the National Suicide Prevention Lifeline at (317)436-9923. This is open 24 hours a day. Summary  Schizoaffective disorder (ScAD) is a lifelong chronic illness. It is best controlled with continuous treatment that includes medicine and therapy.  Learning ways to deal with stress may help your treatment work better.  Having the support of your family and friends can be a key to making your treatment a success.  If you find that your condition is getting worse, talk to your health care provider right away. This information is not intended to replace advice given to you by your health care provider. Make sure you discuss any questions you have with your health care provider. Document Released: 11/20/2016 Document Revised: 11/20/2016 Document Reviewed: 11/20/2016 Elsevier Interactive Patient Education  2019 [employer].

## 2018-12-09 NOTE — Progress Notes (Signed)
D: Patient stated slept good last night .Stated appetite is good and energy level  Is normal. Stated concentration is good . Stated on Depression scale 0 , hopeless 0 and anxiety 0 .( low 0-10 high) Denies suicidal  homicidal ideations  .  No auditory hallucinations  No pain concerns . Appropriate ADL'S. Interacting with peers and staff. Patient aware of information given relating to [facility] education , patient able to verbalize understanding . No anger outburst for past 24 hours . Able to maintained self control . Voice of no safety concerns. Thought process improving. Compliant with medication. Patient able to verbalize positive feeling of self . Patient remains delusional , with magical thinking .Isolates to room , picks and chooses groups  To attend . Feels staff will find placement  For him . Stated all  He wanted to do today Journal ing.  A: Encourage patient participation with unit programming . Instruction  Given on  Medication , verbalize understanding.  R: Voice no other concerns. Staff continue to monitor

## 2018-12-09 NOTE — Progress Notes (Signed)
Recreation Therapy Notes   Date: 12/09/2018  Time: 9:30 am   Location: Craft room   Behavioral response: N/A   Intervention Topic: Goals  Discussion/Intervention: Patient did not attend group.   Clinical Observations/Feedback:  Patient did not attend group.   Chevy Virgo LRT/CTRS        Linet Brash 12/09/2018 11:03 AM 

## 2018-12-09 NOTE — [facility] Group Notes (Signed)
LCSW Group Therapy Note  12/09/2018 1:00 PM  Type of Therapy/Topic:  Group Therapy:  Balance in Life  Participation Level:  Did Not Attend  Description of Group:    This group will address the concept of balance and how it feels and looks when one is unbalanced. Patients will be encouraged to process areas in their lives that are out of balance and identify reasons for remaining unbalanced. Facilitators will guide patients in utilizing problem-solving interventions to address and correct the stressor making their life unbalanced. Understanding and applying boundaries will be explored and addressed for obtaining and maintaining a balanced life. Patients will be encouraged to explore ways to assertively make their unbalanced needs known to significant others in their lives, using other group members and facilitator for support and feedback.  Therapeutic Goals: 1. Patient will identify two or more emotions or situations they have that consume much of in their lives. 2. Patient will identify signs/triggers that life has become out of balance:  3. Patient will identify two ways to set boundaries in order to achieve balance in their lives:  4. Patient will demonstrate ability to communicate their needs through discussion and/or role plays  Summary of Patient Progress: X  Therapeutic Modalities:   Cognitive Behavioral Therapy Solution-Focused Therapy Assertiveness Training  Penni Homans MSW, LCSW 12/09/2018 12:47 PM

## 2018-12-09 NOTE — [facility] Counselor (Signed)
CSW spoke with the pt's mother and father.  CSW asked for the faxed releases.  CSW provided a new fax number and have the releases.  CSW has begon to fax referral information including FL2 and psych notes to group homes.   Penni Homans, MSW, LCSW 12/09/2018 9:51 AM

## 2018-12-09 NOTE — Discharge Summary (Addendum)
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 problematic way.  Still a little delusional if you get into conversations with him but on the surface he is lucid and able to take care of himself.  No threats of aggression or suicide to me although he still can get irritable on the phone with his mother.  No change to medicine over the weekend.  No med changes  11/23/18:  Follow-up for this patient with schizoaffective disorder.  Patient has no new complaints today.  Says he is doing okay.  If you stop and engage in conversation with him for long his delusions and odd thinking will come out but he is not threatening aggressive or violent.  Denies suicidal thoughts.  Tolerating medicine apparently without difficulty.  Vital stable.  Patient is still under IVC and has no other place to stay and has a guardian who is insisting that he should go to long-term hospitalization which she is currently not available.  No med changes  11/24/18: Follow-up patient with schizoaffective disorder.  No new complaints.  Still has psychotic thinking magical thinking disorganized behavior but has not been violent.  No med changes  11/25/18:  Follow-up for this patient with schizoaffective  disorder.  No new complaints.  No changes to presentation.  Still has psychotic symptoms but has not been aggressive or violent.  Feels tired much of the time.  No med changes  11/26/18:  Patient seen and chart reviewed.  Patient with schizoaffective disorder.  He remains stable with no aggressive or violent behavior.  Psychotic and disorganized paranoid thinking on deeper conversation but superficially taking care of his basic ADLs.  Patient continues to be in the hospital in part because his mother who is his guardian wants him referred to longer term inpatient hospitalization.  No med changes  11/27/18:  Follow-up with this patient with schizophrenia.  No significant changes from baseline.  Continues to be disorganized and paranoid but not threatening or violent.  Actually appreciates talking and is pleasant for the most part.  A little run down but not oversedated from his medicine.  No med changes  11/28/18:   Follow-up for this patient with schizoaffective disorder.  No new complaints.  Continues to be delusional and disorganized in his thinking and behavior but not aggressive or violent.  Appears to tolerate medicine well.  Adequate blood levels.  No new complaints other than wanting to find some way that he can get a Clinical research associate.  Depakote decreased to 500 mg BID  11/29/18: Follow-up for this patient with schizoaffective disorder.  Patient was pleasant today.  Interacts well with staff and patients.  When spoken with in detail continues to have some delusions but is functioning adequately on the unit.  Patient asked me if I could adjust his medicine today because he continues to feel tired and fatigued during the daytime.  Denies suicidal or homicidal thoughts.   No medication changes  11/30/18:  Follow-up for this patient with schizoaffective disorder.  He feels a little better after the adjustment of his medicine to have a less sedating medicine in the morning.  Patient has no specific new  complaints.  Wants to call DSS and filed a complaint against his mother which I acknowledged  as being not unreasonable.  He is not expressing any suicidal or homicidal thought.  Not acting out in any sort of violent way.  Patient has been generally calm and lucid.  Zyprexa increased to 40 mg at bedtime   12/01/18:  Patient seen chart reviewed.  Patient has no new complaints today.  Continues to take his medicine.  He actually has a very neatly organized plan on his desk today about trying to contact social services in order to regain his guardianship.  No evidence of acute psychosis.  No violence no aggression no dangerous behavior.  No med changes  12/02/18:  Follow-up for this patient with schizoaffective disorder.  He seems to be really making some progress getting organized with his plan to try and petition the court to have his guardianship back.  He is organizing the right paperwork and needs to call the court in [place].  Not causing any trouble here.  Neatly dressed and groomed.  Thoughts seem to be well organized.  Not focusing on anything delusional.  No med changes  12/03/18:  Follow-up for this gentleman with schizoaffective disorder.  Patient has no specific complaints today.  He continues to appear to be doing pretty well.  He is not agitated not aggressive not hostile and he is able to carry on a lucid conversation.  He keeps himself busy trying to pursue the kind of paperwork he would need to apply for a guardianship hearing.  He does attend some groups and interacts with others fine.  No physical complaints.  No med changes  12/04/18:  He keeps himself busy trying to pursue the kind of paperwork he would need to apply for a guardianship hearing.  It was neatly written, organized spread on a bed.  No med changes  12/05/18:  Pt seen and chart reviewed.  He reports things are going well, and has no complaints.  He eats and sleep well.  He did not want to attend group yesterday.    RN: Mostly isolated in his room, but no behavioral issues. He keeps himself busy trying to pursue the kind of paperwork he would need to apply for a guardianship hearing.  It was neatly written, organized spread on a bed.  No med changes  12/06/18:  "I'm doing well."  Denies hearing any voices or seeing anything, no paranoia, no suicidal/homicidal ideations.  Working on paperwork to try and obtain his guardianship, well organized layout on his bed with copies.    Assessment:  Admitted on 10/17/18 for mania and psychotic symptoms along with some aggression after being off his medications for a month. Safe disposition is challenging.  Pt seen and chart reviewed.  He reports things are going well, and has no complaints.  No issues with eating or sleep.  He seems to be at his baseline at day 25 of hospitalization and has been calm and cooperative for days.  His mother did not want him to return to a group home but wanted him to go to [facility] to stabilize despite already being stable.  The chances of him getting to [facility] based on this is slim to none.  Medical director would like him to discharge on Wednesday as he is stable and has been for days.  Compliant with his medications, interacts appropriately.  He was outside today with peers and staff while listening to music; appeared to be enjoying himself.    Collateral information from his mother called with the social worker and this provider.  When discussing the plan, she requested he be court ordered to take a long term injectable like Abilify Maintenna prior to discharge due to his instability when he stops taking his medications which happens often.  Then, he leaves the group home and roams the streets.  This was agreed upon and then agreeable to group home placement.  Another meeting tomorrow to continue to finalize the plan.  No med changes  12/07/18:  "No, I do not want Invega please.  It made me gain too much weight."  Told him we would keep the Abilify,  he pleasantly said, "I love you, you are precious in my heart." Requested to have all of his medications in the evening so he can "get up earlier and start working out."  Pt seen and chart reviewed including the one from [facility].  Prior to admission the patient had been off his medications for a little over a month.  He lost weight and is concerned about gaining it back. [facility]ed adamantly [facility] as he feels this caused him more weight gain. He also wanted to move his Abilify to the evening with his other medications so he was not so drowsy in the am and could get up earlier.  These requests were granted.  He was pleasant and appropriate, smiling frequently.  Staff reports he is interacting appropriately on the unit.  Collateral information from his mother called with the social worker, this provider, and psychiatrist.  The plan for long term injectable discussed along with an outpatient IVC to make sure he gets this medication, mother seemed very satisfied with this.  Discussed placement and she would like something higher than a group home but agreeable for him to go to a group home with the outpatient IVC in place.    Decreased Zyprexa from 40 mg at bedtime to 30 mg, attempted to started Invega in lieu of Ability by patient adamantly refused  12/08/18:  Patient pleasant.  He did not want the Abilify maintenna but agreed to take it for the night RN along with a COVID 19 test and [facility].  Abilify Maintenna 400 mg IM provided last night along with  COVID 19 test.  Refused the flu vaccine.  12/09/18:  Patient transferred to higher level of care, [facility]  Physical Findings: AIMS: Facial and Oral Movements Muscles of Facial Expression: None, normal Lips and Perioral Area: None, normal Jaw: None, normal Tongue: None, normal,Extremity Movements Upper (arms, wrists, hands, fingers): None, normal Lower (legs, knees, ankles, toes): None, normal, Trunk  Movements Neck, shoulders, hips: None, normal, Overall Severity Severity of abnormal movements (highest score from questions above): None, normal Incapacitation due to abnormal movements: None, normal Patient's awareness of abnormal movements (rate only patient's report): No Awareness, Dental Status Current problems with teeth and/or dentures?: No Does patient usually wear dentures?: No  CIWA:  CIWA-Ar Total: 0 COWS:  COWS Total Score: 0  Musculoskeletal: Strength & Muscle Tone: within normal limits Gait & Station: normal Patient leans: N/A  Psychiatric Specialty Exam: Physical Exam  Nursing note and vitals reviewed. Constitutional: He is oriented to person, place, and time. He appears well-developed and well-nourished.  HENT:  Head: Normocephalic.  Neck: Normal range of motion.  Respiratory: Effort normal.  Musculoskeletal: Normal range of motion.  Neurological: He is alert and oriented to person, place, and time.  Psychiatric: His speech is normal. His mood appears anxious. His affect is labile. He is hyperactive. Thought content is  delusional. Cognition and memory are normal. He expresses impulsivity. He is inattentive.    Review of Systems  Psychiatric/Behavioral: The patient is nervous/anxious.   All other systems reviewed and are negative.   Blood pressure 110/70, pulse 69, temperature 97.7 F (36.5 C), temperature source Oral, resp. rate 18, height  (1.803 m), weight 88.5 kg, SpO2 99 %.Body mass index is 27.2 kg/m.  General Appearance: Casual  Eye Contact:  Fair  Speech:  Normal Rate  Volume:  Normal  Mood:  Anxious and Euphoric  Affect:  Congruent  Thought Process:  Coherent and Descriptions of Associations: Tangential at times  Orientation:  Full (Time, Place, and Person)  Thought Content:  Delusions and Tangential  Suicidal Thoughts:  No  Homicidal Thoughts:  No  Memory:  Immediate;   Fair Recent;   Fair Remote;   Fair  Judgement:  Impaired  Insight:   Lacking  Psychomotor Activity:  Increased  Concentration:  Concentration: Fair and Attention Span: Fair  Recall:  Fair  Fund of Knowledge:  Good  Language:  Good  Akathisia:  No  Handed:  Right  AIMS (if indicated):     Assets:  Leisure Time Physical Health Resilience Social Support  ADL's:  Intact  Cognition:  Impaired,  Mild  Sleep:  Number of Hours: 5     Have you used any form of tobacco in the last 30 days? (Cigarettes, Smokeless Tobacco, Cigars, and/or Pipes): Yes  Has this patient used any form of tobacco in the last 30 days? (Cigarettes, Smokeless Tobacco, Cigars, and/or Pipes) Yes, No  Blood Alcohol level:  Lab Results  Component Value Date   ETH <10 10/16/2018    Metabolic Disorder Labs:  Lab Results  Component Value Date   HGBA1C 5.2 10/23/2018   MPG 102.54 10/23/2018   No results found for: PROLACTIN Lab Results  Component Value Date   CHOL 129 10/23/2018   TRIG 58 10/23/2018   HDL 50 10/23/2018   CHOLHDL 2.6 10/23/2018   VLDL 12 10/23/2018   LDLCALC 67 10/23/2018      [facility]  Is patient on multiple antipsychotic therapies at discharge:  Yes,   Do you recommend tapering to monotherapy for antipsychotics?  No   Has Patient had three or more failed trials of antipsychotic monotherapy by history:  Yes,   Antipsychotic medications that previously failed include:   3.  Haldol, Seroquel, Risperdal.  Recommended Plan for Multiple Antipsychotic Therapies: Additional reason(s) for multiple antispychotic treatment:  needed for stability  Discharge Instructions    Diet - low sodium heart healthy   Complete by:  As directed    Discharge instructions   Complete by:  As directed    Discharged to [facility] for higher level of care   Increase activity slowly   Complete by:  As directed      Allergies as of 12/09/2018   No Known Allergies     Medication List    TAKE these medications     Indication  ARIPiprazole 15 MG  tablet Commonly known as:  ABILIFY Take 1 tablet (15 mg total) by mouth at bedtime.  Indication:  Manic Phase of Manic-Depression   ARIPiprazole ER 400 MG Srer injection Commonly known as:  ABILIFY MAINTENA Inject 2 mLs (400 mg total) into the muscle every 28 (twenty-eight) days. Start taking on:  January 05, 2019  Indication:  MIXED BIPOLAR AFFECTIVE DISORDER, last dose on 12/08/18   divalproex 500 MG 24 hr  tablet Commonly known as:  DEPAKOTE ER Take 2 tablets (1,000 mg total) by mouth at bedtime.  Indication:  MIXED BIPOLAR AFFECTIVE DISORDER   OLANZapine 15 MG tablet Commonly known as:  ZYPREXA Take 2 tablets (30 mg total) by mouth at bedtime.  Indication:  Manic Phase of Manic-Depression   traZODone 100 MG tablet Commonly known as:  DESYREL Take 1 tablet (100 mg total) by mouth at bedtime as needed for sleep.  Indication:  Trouble Sleeping      Follow-up Information    [facility] Follow up.   Contact information: [address] [place] [address] [phone]        [facility] Follow up.   Specialty:  Behavioral Health Why:  on the waiting list Contact information: [address] [place] [address] [phone]          Follow-up recommendations:  Activity:  as tolerated Diet:  heart healthy diet  Comments:  Discharge to [facility], higher level of care  Signed: Nanine Means, NP 12/09/2018, 7:48 AM   Patient seen, evaluated by me and treatment plan formulated by me.  Patient needs further stabilization and treatment in order to be placed in a group home, is being transferred to [facility] for further stabilization and treatment

## 2018-12-09 NOTE — Plan of Care (Signed)
Patient aware of  information  given relating to [place] education , patient  able to verbalize  understanding .  No anger outburst for past 24 hours . Able to maintained  self control . Voice of no safety concerns. Thought process improving. Compliant  with medication. Patient able to verbalize positive feeling of self . Patient remains delusional , with magical thinking . Isolates to room , picks and chooses what group to attend .  Problem: Education: Goal: Knowledge of [facility] Education information/materials will improve Outcome: Progressing   Problem: Coping: Goal: Ability to verbalize frustrations and anger appropriately will improve Outcome: Progressing Goal: Ability to demonstrate self-control will improve Outcome: Progressing   Problem: Safety: Goal: Periods of time without injury will increase Outcome: Progressing   Problem: Education: Goal: Will be free of psychotic symptoms Outcome: Progressing Goal: Knowledge of the prescribed therapeutic regimen will improve Outcome: Progressing   Problem: Self-Concept: Goal: Will verbalize positive feelings about self Outcome: Progressing   Problem: Education: Goal: Ability to state activities that reduce stress will improve Outcome: Progressing   

## 2018-12-09 NOTE — BHH Counselor (Signed)
[facility], [phone] CSW called the owner Rosita Fire at [phone] or [phone] to check on the receipt of the referral.  CSW spoke with [facility] who reported that he will check with his QP and follow up.      Mauri Reading,  CSW called, however, was unable to speak with Mauri Reading and left HIPAA compliant voicemail.    Birder Robson, [phone] Unable to speak with Carney Bern and left a HIPAA compliant voicemail.     Valerie Roys, [phone] CSW was informed that group home will be unable to accept the patient at this time.     [facility]     [phone]     CSW attempted to call and had to leave a HIPAA compliant voicemail.   [facility]    [phone]     CSW was informed that group home has not reviewed the information at this time. Group home will call tomorrow if interested.     [facility]    [phone]     CSW spoke with worker who reported that Valentino Saxon has not picked up the referral at this time.    Penni Homans, MSW, LCSW 12/09/2018 4:03 PM

## 2018-12-10 MED ORDER — ARIPIPRAZOLE 5 MG PO TABS
15.0000 mg | ORAL_TABLET | Freq: Every day | ORAL | Status: AC
  Administered 2018-12-10 – 2018-12-21 (×12): 15 mg via ORAL
  Filled 2018-12-10 (×12): qty 1

## 2018-12-10 NOTE — Progress Notes (Signed)
Recreation Therapy Notes  Date: 12/10/2018  Time: 9:30 am  Location: Outside  Behavioral response: Appropriate  Intervention Topic: Leisure  Discussion/Intervention:  Group content today was focused on leisure. The group defined what leisure is and some positive leisure activities they participate in. Individuals identified the difference between good and bad leisure. Participants expressed how they feel after participating in the leisure of their choice. The group discussed how they go about picking a leisure activity and if others are involved in their leisure activities. The patient stated how many leisure activities they too choose from and reasons why it is important to have leisure time. Individuals participated in the intervention "Exploration of Leisure" where they had a chance to identify new leisure activities as well as benefits of leisure.  Clinical Observations/Feedback:  Patient came to group and identified rapping to beats and listening to music as leisure he like to participate in. Individual was social with peers and staff while engaging in the intervention. Zonia Caplin LRT/CTRS         Grisela Mesch 12/10/2018 10:56 AM

## 2018-12-10 NOTE — BHH Counselor (Signed)
CSW was able to contact the mother to inquire if she is open to any additional counties such as [place] or [place].  Mother declined [place] and gave the okay for [employer] [employer] [facility].  At this time mother continues to decline other counties though asked.  Mother again requested that releases be sent only of beds are available.    [facility], [phone] CSW attempted to call and check on male beds.  CSW had to leave a HIPAA compliant voicemail requesting a return phone call.   [address] [employer], [phone] Not a working number.   [facility], [phone] CSW attempted to call and check on male beds.  CSW had to leave a HIPAA compliant voicemail requesting a return phone call.   [facility] [place] [place], [phone] CSW called but the number had a fax machine like sound.  [facility], [phone] CSW was asked to contact Nunzio Cory at [phone]. CSW called and was told that she needed to complete a referral form.  CSW had to provide email address for the form to be sent. Aileen recommended that CSW review the form with the patient and or guardian to identify what areas the family may prefer.   Renita Papa [facility], [phone] CSW attempted to call and check on male beds.  CSW had to leave a HIPAA compliant voicemail requesting a return phone call.   [facility], [phone] CSW was informed that they were full at this time.   [facility], [phone] Has not reviewed it at this time but will give a call back.  Requested that information be resent.   [facility], [phone] CSW called the owner Rosita Fire at [phone].  He reports that he cannot take the pt, however will pass on this CSW phone number to someone who may be able to.    Mauri Reading, [phone] No answer, CSW left HIPAA compliant voicemail    [facility] Robson, [phone]  Reports that she is interested but has  not had the opportunity to review. Confirmed that she has the information.     [facility]    [phone]    CSW has attempted to call several times, however, the line is busy each time.    [facility], [phone] Does have male beds.  Fax information to [place] at [phone].  [facility], [phone] Male beds available.  Fax information to [facility] [phone].  Penni Homans, MSW, LCSW 12/10/2018 2:08 PM

## 2018-12-10 NOTE — Progress Notes (Signed)
Patient is engaging appropriately in the unit and maintaining safety guideline and expected behaviors no self injurious behaviors,  and patent denies any suicidal,  Homicidal ideations and denies depressions or anxiety and needing no assist with ADL's . Patient is medication compliant without any side effects or any physical complains , patient is in his room most of the time reading or writing, only comes out for meals or to use the phones, patient is monitored every 15 minutes intervals for safety no distress.

## 2018-12-10 NOTE — BHH Counselor (Addendum)
CSW called the following to group homes to follow up on referrals:  [facility], [phone] No answer, unable to leave voicemail as voicemail box was full.   [facility], [phone] CSW called the owner Rosita Fire at [phone].  No answer, CSW left HIPAA compliant voicemail.    Mauri Reading, [phone] No answer, CSW left HIPAA compliant voicemail   [facility] Robson, [phone]  Does have a male bed.  Home is called [facility].  She requested that FL2 be faxed to [phone]. CSW sent fax and received conformation of receipt. Carney Bern reports that she will follow up once she has had a chance to review documentation.   [facility] [phone]  CSW was asked to return call on Monday at 9:30AM.  CSW was informed that the paperwork and documentation has not been reviewed.   [facility] [phone]  CSW spoke with Everardo Beals who reports "we're not interested".     [facility] [phone]  CSW called and was informed that fax was received, however owner was not there to discuss.  CSW was asked to call back in one hour.   CSW also called [facility] [phone], to see if he had any male beds and was informed that he does not.  He reported that his family's other group homes do not either.  He will ask around to peers and follow up with this CSW on possible group homes with male beds.    Penni Homans, MSW, LCSW 12/10/2018 10:42 AM

## 2018-12-10 NOTE — BHH Counselor (Signed)
CSW faxed the releases to the patient's mother.  CSW received confirmation of receipt of fax.  Penni Homans, MSW, LCSW 12/10/2018 2:09 PM

## 2018-12-10 NOTE — Progress Notes (Signed)
Patient ID: Jackson [name], male   DOB: 05/13/1986, 33 y.o.   MRN: [id number]  [facility] MD Progress Note  12/10/2018 9:45 AM Jackson [name]  MRN:  [id number]   Subjective: "I'm doing well."   Assessment:  Admitted on 10/17/18 for mania and psychotic symptoms along with some aggression after being off his medications for a month. Safe disposition is challenging.   Objective: Patient seen today, notes reviewed.  Patient's continues to work on his coping skills, states that he is doing better, denies any side effects with the Abilify Maintena IM.  Patient states that he is taking his medications as prescribed, wants to be able to be discharged.  Discussed in length with patient that he will need to be in a supervised facility till he is able to be stable and live independently.  Patient has a long history of noncompliance, safety issues.  Social worker working with mom who is patient's legal guardian at looking at group home options and patient has also been referred to [facility].  Patient denies any thoughts of self-harm or harm to others but continues to have limited insight into his behavior, his illness.  Principal Problem: Schizoaffective disorder, bipolar type (HCC) Diagnosis: Principal Problem:   Schizoaffective disorder, bipolar type (HCC) Active Problems:   Noncompliance  Total Time spent with patient: 20 minutes  Past Psychiatric History: Patient has a history of bipolar disorder or schizoaffective disorder and has required lengthy hospitalization in the past  Past Medical History:  Past Medical History:  Diagnosis Date  . Bipolar 1 disorder (HCC)   . Schizoaffective disorder (HCC)    History reviewed. No pertinent surgical history. Family History: History reviewed. No pertinent family history. Family Psychiatric  History: None known Social History:  Social History   Substance and Sexual Activity  Alcohol Use Not on file     Social History   Substance and Sexual Activity  Drug  Use Not on file    Social History   Socioeconomic History  . Marital status: Single    Spouse name: Not on file  . Number of children: Not on file  . Years of education: Not on file  . Highest education level: Not on file  Occupational History  . Not on file  Social Needs  . Financial resource strain: Not on file  . Food insecurity:    Worry: Not on file    Inability: Not on file  . Transportation needs:    Medical: Not on file    Non-medical: Not on file  Tobacco Use  . Smoking status: Light Tobacco Smoker    Packs/day: 0.25    Types: Cigarettes  . Smokeless tobacco: Never Used  Substance and Sexual Activity  . Alcohol use: Not on file  . Drug use: Not on file  . Sexual activity: Not on file  Lifestyle  . Physical activity:    Days per week: Not on file    Minutes per session: Not on file  . Stress: Not on file  Relationships  . Social connections:    Talks on phone: Not on file    Gets together: Not on file    Attends religious service: Not on file    Active member of club or organization: Not on file    Attends meetings of clubs or organizations: Not on file    Relationship status: Not on file  Other Topics Concern  . Not on file  Social History Narrative  . Not on file   Additional  Social History:   Sleep: Good  Appetite:  Good  Current Medications: Current Facility-Administered Medications  Medication Dose Route Frequency Provider Last Rate Last Dose  . acetaminophen (TYLENOL) tablet 650 mg  650 mg Oral Q6H PRN [name], MD   650 mg at 10/26/18 2302  . alum & mag hydroxide-simeth (MAALOX/MYLANTA) 200-200-20 MG/5ML suspension 30 mL  30 mL Oral Q4H PRN [name], MD   30 mL at 10/26/18 2129  . ARIPiprazole (ABILIFY) tablet 15 mg  15 mg Oral QHS Charm Rings, NP   15 mg at 12/09/18 2324  . ARIPiprazole ER (ABILIFY MAINTENA) injection 400 mg  400 mg Intramuscular Q28 days Charm Rings, NP   400 mg at 12/08/18 2217  . divalproex  (DEPAKOTE ER) 24 hr tablet 1,000 mg  1,000 mg Oral QHS [name], [name], MD   1,000 mg at 12/09/18 2324  . hydrOXYzine (ATARAX/VISTARIL) tablet 50 mg  50 mg Oral Q4H PRN [name], MD   50 mg at 10/28/18 1610  . Influenza vac split quadrivalent PF (FLUARIX) injection 0.5 mL  0.5 mL Intramuscular Once Charm Rings, NP      . magnesium hydroxide (MILK OF MAGNESIA) suspension 30 mL  30 mL Oral Daily PRN [name], MD      . OLANZapine [facility]) injection 10 mg  10 mg Intramuscular Q6H PRN [name], [name], MD      . OLANZapine (ZYPREXA) tablet 30 mg  30 mg Oral QHS Charm Rings, NP   30 mg at 12/09/18 2323  . traZODone (DESYREL) tablet 100 mg  100 mg Oral QHS PRN [name], MD   100 mg at 11/26/18 2123    Lab Results:  Results for orders placed or performed during the hospital encounter of 10/17/18 (from the past 48 hour(s))  SARS Coronavirus 2 (CEPHEID - Performed in [facility] hospital lab), Hosp Order     Status: None   Collection Time: 12/08/18 10:30 PM  Result Value Ref Range   SARS Coronavirus 2 NEGATIVE NEGATIVE    Comment: (NOTE) If result is NEGATIVE SARS-CoV-2 target nucleic acids are NOT DETECTED. The SARS-CoV-2 RNA is generally detectable in upper and lower  respiratory specimens during the acute phase of infection. The lowest  concentration of SARS-CoV-2 viral copies this assay can detect is 250  copies / mL. A negative result does not preclude SARS-CoV-2 infection  and should not be used as the sole basis for treatment or other  patient management decisions.  A negative result may occur with  improper specimen collection / handling, submission of specimen other  than nasopharyngeal swab, presence of viral mutation(s) within the  areas targeted by this assay, and inadequate number of viral copies  (<250 copies / mL). A negative result must be combined with clinical  observations, patient history, and epidemiological information. If result is  POSITIVE SARS-CoV-2 target nucleic acids are DETECTED. The SARS-CoV-2 RNA is generally detectable in upper and lower  respiratory specimens dur ing the acute phase of infection.  Positive  results are indicative of active infection with SARS-CoV-2.  Clinical  correlation with patient history and other diagnostic information is  necessary to determine patient infection status.  Positive results do  not rule out bacterial infection or co-infection with other viruses. If result is PRESUMPTIVE POSTIVE SARS-CoV-2 nucleic acids MAY BE PRESENT.   A presumptive positive result was obtained on the submitted specimen  and confirmed on repeat testing.  While 2019 novel coronavirus  (SARS-CoV-2) nucleic acids may be present in the submitted sample  additional confirmatory testing may be necessary for epidemiological  and / or clinical management purposes  to differentiate between  SARS-CoV-2 and other Sarbecovirus currently known to infect humans.  If clinically indicated additional testing with an alternate test  methodology [phone]) is advised. The SARS-CoV-2 RNA is generally  detectable in upper and lower respiratory sp ecimens during the acute  phase of infection. The expected result is Negative. Fact Sheet for Patients:  BoilerBrush.com.cy Fact Sheet for Healthcare Providers: https://pope.com/ This test is not yet approved or cleared by the [place] FDA and has been authorized for detection and/or diagnosis of SARS-CoV-2 by FDA under an Emergency Use Authorization (EUA).  This EUA will remain in effect (meaning this test can be used) for the duration of the COVID-19 declaration under Section 564(b)(1) of the Act, 21 U.S.C. section 360bbb-3(b)(1), unless the authorization is terminated or revoked sooner. Performed at [facility], [address] [place], [place] [address]     Blood Alcohol level:  Lab Results  Component  Value Date   [facility] <10 10/16/2018    Metabolic Disorder Labs: Lab Results  Component Value Date   HGBA1C 5.2 10/23/2018   MPG 102.54 10/23/2018   No results found for: PROLACTIN Lab Results  Component Value Date   CHOL 129 10/23/2018   TRIG 58 10/23/2018   HDL 50 10/23/2018   CHOLHDL 2.6 10/23/2018   VLDL 12 10/23/2018   LDLCALC 67 10/23/2018    Physical Findings: AIMS: Facial and Oral Movements Muscles of Facial Expression: None, normal Lips and Perioral Area: None, normal Jaw: None, normal Tongue: None, normal,Extremity Movements Upper (arms, wrists, hands, fingers): None, normal Lower (legs, knees, ankles, toes): None, normal, Trunk Movements Neck, shoulders, hips: None, normal, Overall Severity Severity of abnormal movements (highest score from questions above): None, normal Incapacitation due to abnormal movements: None, normal Patient's awareness of abnormal movements (rate only patient's report): No Awareness, Dental Status Current problems with teeth and/or dentures?: No Does patient usually wear dentures?: No  CIWA:  CIWA-Ar Total: 0 COWS:  COWS Total Score: 0  Musculoskeletal: Strength & Muscle Tone: within normal limits Gait & Station: normal Patient leans: N/A  Psychiatric Specialty Exam: Physical Exam  Nursing note and vitals reviewed. Constitutional: He appears well-developed and well-nourished.  HENT:  Head: Normocephalic and atraumatic.  Eyes: Pupils are equal, round, and reactive to light. Conjunctivae are normal.  Neck: Normal range of motion.  Cardiovascular: Normal heart sounds.  Respiratory: Effort normal.  GI: Soft.  Musculoskeletal: Normal range of motion.  Neurological: He is alert.  Skin: Skin is warm and dry.  Psychiatric: His speech is normal and behavior is normal. Judgment and thought content normal. His mood appears anxious. His affect is blunt. Cognition and memory are normal.    Review of Systems  Constitutional: Negative.   Negative for chills, fever, malaise/fatigue and weight loss.  HENT: Negative.  Negative for congestion, hearing loss and sore throat.   Eyes: Negative.  Negative for blurred vision, double vision, discharge and redness.  Respiratory: Negative.  Negative for cough, shortness of breath and wheezing.   Cardiovascular: Negative.  Negative for chest pain and palpitations.  Gastrointestinal: Negative.  Negative for abdominal pain, diarrhea, heartburn, nausea and vomiting.  Musculoskeletal: Negative.   Skin: Negative.  Negative for rash.  Neurological: Negative.  Negative for dizziness, seizures, loss of consciousness and headaches.  Endo/Heme/Allergies: Negative.  Negative  for environmental allergies.  Psychiatric/Behavioral: Negative for depression, hallucinations and suicidal ideas. The patient is nervous/anxious.     Blood pressure 104/80, pulse 74, temperature 97.7 F (36.5 C), temperature source Oral, resp. rate 17, height 5\' 11"  (1.803 m), weight 88.5 kg, SpO2 100 %.Body mass index is 27.2 kg/m.  General Appearance: Casual  Eye Contact:  Good  Speech:  Clear and Coherent  Volume:  Normal  Mood:  Euthymic  Affect:  Blunt  Thought Process:  Coherent, Linear and Descriptions of Associations: Intact  Orientation:  Full (Time, Place, and Person)  Thought Content:  Logical  Suicidal Thoughts:  No  Homicidal Thoughts:  No  Memory:  Immediate;   Fair Recent;   Fair Remote;   Fair  Judgement:  Fair  Insight:  Fair  Psychomotor Activity:  Normal  Concentration:  Concentration: Fair  Recall:  FiservFair  Fund of Knowledge:  Fair  Language:  Fair  Akathisia:  No  Handed:  Right  AIMS (if indicated):     Assets:  Communication Skills Desire for Improvement Financial Resources/Insurance Physical Health Resilience Social Support  ADL's:  Intact  Cognition:  WNL  Sleep:  Number of Hours: 6.75     Treatment Plan Summary: Daily contact with patient to assess and evaluate symptoms and  progress in treatment and Medication management   # Schizoaffective disorder, bipolar type -- continue Abilify 15 mg daily for 14 days from injection -- continue zyprexa 30mg  qhs -- continue Depakote ER 1000mg  qhs.    Insomnia: -- continue Trazodone 100mg  qhs prn.   Anxiety: -Continued hydroxyzine 50 mg every four hours PRN  - Will maintain Q 15 minutes observation for safety.   -Negative COVID test  -Patient will participate in group, individual, and milieu therapy.Psychotherapy: Social and Doctor, hospitalcommunication skill training, learning based strategies, cognitive behavioral psychotherapies can be considered.   -Will continue to monitor patient's mood and behavior.  -Social Work working on discharge planning for as soon as possible but delayed due to group home placement need  -Discharge concerns will also be addressed: Safety, stabilization, and access to medication  Nanine MeansJamison Lord, NP 12/10/2018, 9:45 AM

## 2018-12-10 NOTE — B[facility] Counselor (Signed)
CSW spoke with the Birder Robson who called to reports that she is still interested and the the QP will review paperwork and call to set up a virtual meeting.  Vertell Limber called with questions regarding pt's Covid-19 test results, the way most recent psych note is written confuses her.  Will require copy of negative results.  Wants to speak to mother.  CSW provided mother with the contact information.    Penni Homans, MSW, LCSW 12/10/2018 2:49 PM

## 2018-12-10 NOTE — Progress Notes (Signed)
D - Patient was in his room upon arrival to the unit. Patient was pleasant during assessment and medication administration. Patient denies SI/HI/AVH, pain, anxiety and depression with this Clinical research associate. Patient still has hallucinations thinking he can produce orbs and show them to this Clinical research associate.   A - Patient was compliant with medication administration per MD orders and procedures on the unit. Patient given education. Patient given support and encouragement to be active in his treatment plan. Patient informed to let staff know if there are any issues or problems on the unit.   R - Patient being monitored Q 15 minutes for safety per unit protocol. Patient remains safe on the unit.

## 2018-12-10 NOTE — [facility] Counselor (Signed)
CSW called the following to identify if there are any male beds available:  [facility], [phone] Number is not in service.  [facility], [phone] Number rang with no option of voicemail, no one answered.   [facility], [phone] Number rang then became a busy signal.   [employer], [phone] CSW spoke with [place].  CSW was asked to fax over name, information and behavior assessment to same number [facility].  Call before sending the fax.  [facility], [phone]  CSW spoke with the [facility] who requested that we discuss the pt in greater detail once the mother has signed the ROI.  He will then provide the fax number for the information to be sent. Contact number is [phone]  Penni Homans, MSW, LCSW 12/10/2018 2:59 PM

## 2018-12-10 NOTE — Plan of Care (Signed)
  Problem: Education: Goal: Knowledge of [place] [facility] Education information/materials will improve Outcome: Progressing   Problem: Coping: Goal: Ability to verbalize frustrations and anger appropriately will improve Outcome: Progressing Goal: Ability to demonstrate self-control will improve Outcome: Progressing   Problem: Safety: Goal: Periods of time without injury will increase Outcome: Progressing   Problem: Education: Goal: Will be free of psychotic symptoms Outcome: Progressing Goal: Knowledge of the prescribed therapeutic regimen will improve Outcome: Progressing   Problem: Self-Concept: Goal: Will verbalize positive feelings about self Outcome: Progressing   Problem: Education: Goal: Ability to state activities that reduce stress will improve Outcome: Progressing   

## 2018-12-10 NOTE — Plan of Care (Signed)
D- Patient alert and oriented. Patient presents in a pleasant mood on assessment stating that he slept "good" last night and had no major complaints or concerns to voice to this Clinical research associate. Patient denies any signs/symptoms of depression and anxiety reporting that he is "A ok". Patient also denies SI, HI, AVH, and pain at this time. Patient had no stated goals for today.  A- Support and encouragement provided.  Routine safety checks conducted every 15 minutes. Patient informed to notify staff with problems or concerns.  R- Patient contracts for safety at this time. Patient compliant with treatment plan. Patient receptive, calm, and cooperative. Patient interacts well with others on the unit.  Patient remains safe at this time.  Problem: Education: Goal: Knowledge of [place] [facility] Education information/materials will improve Outcome: Progressing   Problem: Coping: Goal: Ability to verbalize frustrations and anger appropriately will improve Outcome: Progressing Goal: Ability to demonstrate self-control will improve Outcome: Progressing   Problem: Safety: Goal: Periods of time without injury will increase Outcome: Progressing   Problem: Education: Goal: Will be free of psychotic symptoms Outcome: Progressing Goal: Knowledge of the prescribed therapeutic regimen will improve Outcome: Progressing   Problem: Self-Concept: Goal: Will verbalize positive feelings about self Outcome: Progressing   Problem: Education: Goal: Ability to state activities that reduce stress will improve Outcome: Progressing

## 2018-12-10 NOTE — Plan of Care (Signed)
Patient still has hallucinations   Problem: Education: Goal: Will be free of psychotic symptoms Outcome: Not Progressing

## 2018-12-11 DIAGNOSIS — F250 Schizoaffective disorder, bipolar type: Secondary | ICD-10-CM

## 2018-12-11 NOTE — [facility] Group Notes (Signed)
LCSW Group Therapy Note  12/11/2018 1:15pm  Type of Therapy and Topic:  Group Therapy:  Healthy Self Image and Positive Change  Participation Level:  Did Not Attend   Description of Group:  In this group, patients will compare and contrast their current "I am...." statements to the visions they identify as desirable for their lives.  Patients discuss fears and how they can make positive changes in their cognitions that will positively impact their behaviors.  Facilitator played a motivational 3-minute speech and patients were left with the task of thinking about what "I am...." statements they can start using in their lives immediately.  Therapeutic Goals: 1. Patient will state their current self-perception as expressed in an "I Am" statement 2. Patient will contrast this with their desired vision for their live 3. Patient will identify 3 fears that negatively impact their behavior 4. Patient will discuss cognitive distortions that stem from their fears 5. Patient will verbalize statements that challenge their cognitive distortions  Summary of Patient Progress:  Pt was invited to attend group but chose not to attend. CSW will continue to encourage pt to attend group throughout their admission.    Therapeutic Modalities Cognitive Behavioral Therapy Motivational Interviewing  Neala Miggins  [name], LCSW 12/11/2018 12:23 PM 

## 2018-12-11 NOTE — Plan of Care (Signed)
Denies SI/HI/AVH. Patient isolates mostly to self. Patient is at nurses station with request. Patient is appropriate and cooperative. Patient's safety is maintained.    Problem: Education: Goal: Knowledge of [place] [facility] Education information/materials will improve Outcome: Progressing   Problem: Safety: Goal: Periods of time without injury will increase Outcome: Progressing

## 2018-12-11 NOTE — Progress Notes (Signed)
[facility] MD Progress Note  12/11/2018 11:39 AM Cono Dowson  MRN:  [id number]  Principal Problem: Schizoaffective disorder, bipolar type (HCC) Diagnosis: Principal Problem:   Schizoaffective disorder, bipolar type (HCC) Active Problems:   Noncompliance  Total Time spent with patient: 15 minutes  33yo M, who presents to [facility] inpatient psych unit due to exacerbation of Schizoaffective disorder, particulary mania and psychosis.  Patient seen, chart reviewed. Patient is calm, cooperative, participates in unit activities, interactive with staff and other patients, his behavior is appropriate.  He reports "I am fine, nice to see you, doc", "I take my meds". He reports good mood, denies feeling depressed, anxious, reports good night sleep, good appetite, denies any mental or physical complaints; denies thoughts of harming self or others. He denies any side effects from his medications. says "I am ready for discharge", "I want to go back to a group home".   Social worker working with mom who is patient's legal guardian at looking at group home options and patient has also been referred to [facility]   Past Medical History:  Past Medical History:  Diagnosis Date  . Bipolar 1 disorder (HCC)   . Schizoaffective disorder (HCC)    History reviewed. No pertinent surgical history. Family History: History reviewed. No pertinent family history. Family Psychiatric  History: See H&P Social History:  Social History   Substance and Sexual Activity  Alcohol Use Not on file     Social History   Substance and Sexual Activity  Drug Use Not on file    Social History   Socioeconomic History  . Marital status: Single    Spouse name: Not on file  . Number of children: Not on file  . Years of education: Not on file  . Highest education level: Not on file  Occupational History  . Not on file  Social Needs  . Financial resource strain: Not on file  . Food insecurity:    Worry: Not on file     Inability: Not on file  . Transportation needs:    Medical: Not on file    Non-medical: Not on file  Tobacco Use  . Smoking status: Light Tobacco Smoker    Packs/day: 0.25    Types: Cigarettes  . Smokeless tobacco: Never Used  Substance and Sexual Activity  . Alcohol use: Not on file  . Drug use: Not on file  . Sexual activity: Not on file  Lifestyle  . Physical activity:    Days per week: Not on file    Minutes per session: Not on file  . Stress: Not on file  Relationships  . Social connections:    Talks on phone: Not on file    Gets together: Not on file    Attends religious service: Not on file    Active member of club or organization: Not on file    Attends meetings of clubs or organizations: Not on file    Relationship status: Not on file  Other Topics Concern  . Not on file  Social History Narrative  . Not on file   Additional Social History:  See H&P    Sleep: Good  Appetite:  Good  Current Medications: Current Facility-Administered Medications  Medication Dose Route Frequency Provider Last Rate Last Dose  . acetaminophen (TYLENOL) tablet 650 mg  650 mg Oral Q6H PRN [name], MD   650 mg at 10/26/18 2302  . alum & mag hydroxide-simeth (MAALOX/MYLANTA) 200-200-20 MG/5ML suspension 30 mL  30 mL Oral  Q4H PRN Terance HartStephens, [name], MD   30 mL at 10/26/18 2129  . ARIPiprazole (ABILIFY) tablet 15 mg  15 mg Oral QHS Charm RingsLord, Jamison Y, NP   15 mg at 12/10/18 2049  . ARIPiprazole ER (ABILIFY MAINTENA) injection 400 mg  400 mg Intramuscular Q28 days Charm RingsLord, Jamison Y, NP   400 mg at 12/08/18 2217  . divalproex (DEPAKOTE ER) 24 hr tablet 1,000 mg  1,000 mg Oral QHS [name], [name], MD   1,000 mg at 12/10/18 2048  . hydrOXYzine (ATARAX/VISTARIL) tablet 50 mg  50 mg Oral Q4H PRN Terance HartStephens, [name], MD   50 mg at 10/28/18 [id number]  . Influenza vac split quadrivalent PF (FLUARIX) injection 0.5 mL  0.5 mL Intramuscular Once Charm RingsLord, Jamison Y, NP      . magnesium hydroxide (MILK  OF MAGNESIA) suspension 30 mL  30 mL Oral Daily PRN Terance HartStephens, [name], MD      . OLANZapine [facility](ZYPREXA) injection 10 mg  10 mg Intramuscular Q6H PRN [name], [name], MD      . OLANZapine (ZYPREXA) tablet 30 mg  30 mg Oral QHS Charm RingsLord, Jamison Y, NP   30 mg at 12/10/18 2048  . traZODone (DESYREL) tablet 100 mg  100 mg Oral QHS PRN Terance HartStephens, [name], MD   100 mg at 11/26/18 2123    Lab Results: No results found for this or any previous visit (from the past 48 hour(s)).  Blood Alcohol level:  Lab Results  Component Value Date   ETH <10 10/16/2018    Metabolic Disorder Labs: Lab Results  Component Value Date   HGBA1C 5.2 10/23/2018   MPG 102.54 10/23/2018   No results found for: PROLACTIN Lab Results  Component Value Date   CHOL 129 10/23/2018   TRIG 58 10/23/2018   HDL 50 10/23/2018   CHOLHDL 2.6 10/23/2018   VLDL 12 10/23/2018   LDLCALC 67 10/23/2018    Physical Findings: AIMS: Facial and Oral Movements Muscles of Facial Expression: None, normal Lips and Perioral Area: None, normal Jaw: None, normal Tongue: None, normal,Extremity Movements Upper (arms, wrists, hands, fingers): None, normal Lower (legs, knees, ankles, toes): None, normal, Trunk Movements Neck, shoulders, hips: None, normal, Overall Severity Severity of abnormal movements (highest score from questions above): None, normal Incapacitation due to abnormal movements: None, normal Patient's awareness of abnormal movements (rate only patient's report): No Awareness, Dental Status Current problems with teeth and/or dentures?: No Does patient usually wear dentures?: No  CIWA:  CIWA-Ar Total: 0 COWS:  COWS Total Score: 0  Musculoskeletal: Strength & Muscle Tone: within normal limits Gait & Station: normal Patient leans: N/A  Psychiatric Specialty Exam: Physical Exam  ROS  Blood pressure 118/78, pulse 63, temperature 98.5 F (36.9 C), temperature source Oral, resp. rate 18, height 5\' 11"  (1.803 m), weight  88.5 kg, SpO2 99 %.Body mass index is 27.2 kg/m.  General Appearance: Casual and Fairly Groomed  Eye Contact:  Good  Speech:  Normal Rate  Volume:  Normal  Mood:  Euthymic  Affect:  Appropriate and Congruent  Thought Process:  Coherent  Orientation:  Full (Time, Place, and Person)  Thought Content:  Logical  Suicidal Thoughts:  No  Homicidal Thoughts:  No  Memory:  Immediate;   Fair Recent;   Fair Remote;   Fair  Judgement:  Fair  Insight:  Fair  Psychomotor Activity:  Normal  Concentration:  Concentration: Fair and Attention Span: Fair  Recall:  [employer]  Fund of Knowledge:  Fair  Language:  Fair  Akathisia:  No  Handed:  Right  AIMS (if indicated):     Assets:  Communication Skills Desire for Improvement Financial Resources/Insurance Physical Health Resilience Social Support  ADL's:  Intact  Cognition:  WNL  Sleep:  Number of Hours: 6.5     Treatment Plan Summary: Daily contact with patient to assess and evaluate symptoms and progress in treatment   33yo M, who presents to [facility] inpatient psych unit due to exacerbation of Schizoaffective disorder, particulary mania and psychosis. Patient is calm, cooperative, he participates in unit activities, interactive with staff and other patients, his behavior is appropriate. He denies any mental or physical complaints. No thoughts of harming self or others. He appears still grandiose somewhat. His insight and judgement are limited. No indication for medication changes today.  Impression: Schizoaffective disorder, bipolar type, current episode - acute mania.  Plan: -continue inpatient psych admission; 15-minute checks; daily contact with patient to assess and evaluate symptoms and progress in treatment; psychoeducation.  -continue scheduled psych medications: Abilify Maintena  administered on 12/08/18; continue Abilify  PO daily for 2 more weeks after IM LAI. Zyprexa  PO at bedtime Depakote  PO at  bedtime Hydroxyzine  PO !4H PRN anxiety. Trazodone  PO QHS PRN sleep.  -continue PRN medications.  -Disposition: to be determined. Social Workworking on discharge planning for as soon as possible but delayed due to group home placement need.   Thalia Party, MD 12/11/2018, 11:39 AM

## 2018-12-12 DIAGNOSIS — F250 Schizoaffective disorder, bipolar type: Secondary | ICD-10-CM

## 2018-12-12 NOTE — Plan of Care (Signed)
  Problem: Education: Goal: Will be free of psychotic symptoms Outcome: Progressing Goal: Knowledge of the prescribed therapeutic regimen will improve Outcome: Progressing   D: Patient has been pleasant, cooperative and appropriate. No complaints other than that the Depakote pills are difficult to swallow. Affect is bright. Not voicing any delusional content. Denies SI and HI. A: Continue to monitor for safety. R:  Safety maintained.

## 2018-12-12 NOTE — BHH Group Notes (Signed)
LCSW Group Therapy Note 12/12/2018 1:15pm  Type of Therapy and Topic: Group Therapy: Feelings Around Returning Home & Establishing a Supportive Framework and Supporting Oneself When Supports Not Available  Participation Level: Did Not Attend  Description of Group:  Patients first processed thoughts and feelings about upcoming discharge. These included fears of upcoming changes, lack of change, new living environments, judgements and expectations from others and overall stigma of mental health issues. The group then discussed the definition of a supportive framework, what that looks and feels like, and how do to discern it from an unhealthy non-supportive network. The group identified different types of supports as well as what to do when your family/friends are less than helpful or unavailable  Therapeutic Goals  1. Patient will identify one healthy supportive network that they can use at discharge. 2. Patient will identify one factor of a supportive framework and how to tell it from an unhealthy network. 3. Patient able to identify one coping skill to use when they do not have positive supports from others. 4. Patient will demonstrate ability to communicate their needs through discussion and/or role plays.  Summary of Patient Progress:  Pt was invited to attend group but chose not to attend. CSW will continue to encourage pt to attend group throughout their admission.   Therapeutic Modalities Cognitive Behavioral Therapy Motivational Interviewing   Lluvia Gwynne  [name], LCSW 12/12/2018 12:55 PM  

## 2018-12-12 NOTE — Progress Notes (Signed)
[facility] MD Progress Note  12/12/2018 9:04 AM Jackson [name]  MRN:  [id number]  Principal Problem: Schizoaffective disorder, bipolar type (HCC) Diagnosis: Principal Problem:   Schizoaffective disorder, bipolar type (HCC) Active Problems:   Noncompliance  Total Time spent with patient: 15 minutes   33yo M, who presents to [facility] inpatient psych unit due to exacerbation of Schizoaffective disorder, particulary mania and psychosis.  Patient seen, chart reviewed. Patient is calm, cooperative, participates in unit activities, interactive with staff and other patients, his behavior is appropriate.  SUBJECTIVE: Patiernt reports "everything is good". He continues to report "good" mood, denies feeling depressed, anxious, reports good night sleep,good appetite, denies any mental or physical complaints; deniesthoughts of harming self or others. He denies any side effects from his medications. Social worker working with mom who is patient's legal guardian at looking at group home options and patient has also been referred to [facility].   Past Psychiatric History: see H&P  Past Medical History:  Past Medical History:  Diagnosis Date  . Bipolar 1 disorder (HCC)   . Schizoaffective disorder (HCC)    History reviewed. No pertinent surgical history. Family History: History reviewed. No pertinent family history. Family Psychiatric  History: see H&P Social History:  Social History   Substance and Sexual Activity  Alcohol Use Not on file     Social History   Substance and Sexual Activity  Drug Use Not on file    Social History   Socioeconomic History  . Marital status: Single    Spouse name: Not on file  . Number of children: Not on file  . Years of education: Not on file  . Highest education level: Not on file  Occupational History  . Not on file  Social Needs  . Financial resource strain: Not on file  . Food insecurity:    Worry: Not on file    Inability: Not on file  .  Transportation needs:    Medical: Not on file    Non-medical: Not on file  Tobacco Use  . Smoking status: Light Tobacco Smoker    Packs/day: 0.25    Types: Cigarettes  . Smokeless tobacco: Never Used  Substance and Sexual Activity  . Alcohol use: Not on file  . Drug use: Not on file  . Sexual activity: Not on file  Lifestyle  . Physical activity:    Days per week: Not on file    Minutes per session: Not on file  . Stress: Not on file  Relationships  . Social connections:    Talks on phone: Not on file    Gets together: Not on file    Attends religious service: Not on file    Active member of club or organization: Not on file    Attends meetings of clubs or organizations: Not on file    Relationship status: Not on file  Other Topics Concern  . Not on file  Social History Narrative  . Not on file   Additional Social History:     Sleep: Good  Appetite:  Good  Current Medications: Current Facility-Administered Medications  Medication Dose Route Frequency Provider Last Rate Last Dose  . acetaminophen (TYLENOL) tablet 650 mg  650 mg Oral Q6H PRN [name], MD   650 mg at 10/26/18 2302  . alum & mag hydroxide-simeth (MAALOX/MYLANTA) 200-200-20 MG/5ML suspension 30 mL  30 mL Oral Q4H PRN [name], MD   30 mL at 10/26/18 2129  . ARIPiprazole (ABILIFY) tablet 15 mg  15 mg Oral QHS Charm Rings, NP   15 mg at 12/11/18 2133  . ARIPiprazole ER (ABILIFY MAINTENA) injection 400 mg  400 mg Intramuscular Q28 days Charm Rings, NP   400 mg at 12/08/18 2217  . divalproex (DEPAKOTE ER) 24 hr tablet 1,000 mg  1,000 mg Oral QHS [name], [name], MD   1,000 mg at 12/11/18 2133  . hydrOXYzine (ATARAX/VISTARIL) tablet 50 mg  50 mg Oral Q4H PRN [name], MD   50 mg at 10/28/18 1610  . Influenza vac split quadrivalent PF (FLUARIX) injection 0.5 mL  0.5 mL Intramuscular Once Charm Rings, NP      . magnesium hydroxide (MILK OF MAGNESIA) suspension 30 mL  30  mL Oral Daily PRN [name], MD      . OLANZapine [facility]) injection 10 mg  10 mg Intramuscular Q6H PRN [name], [name], MD      . OLANZapine (ZYPREXA) tablet 30 mg  30 mg Oral QHS Charm Rings, NP   30 mg at 12/11/18 2132  . traZODone (DESYREL) tablet 100 mg  100 mg Oral QHS PRN [name], MD   100 mg at 11/26/18 2123    Lab Results: No results found for this or any previous visit (from the past 48 hour(s)).  Blood Alcohol level:  Lab Results  Component Value Date   ETH <10 10/16/2018    Metabolic Disorder Labs: Lab Results  Component Value Date   HGBA1C 5.2 10/23/2018   MPG 102.54 10/23/2018   No results found for: PROLACTIN Lab Results  Component Value Date   CHOL 129 10/23/2018   TRIG 58 10/23/2018   HDL 50 10/23/2018   CHOLHDL 2.6 10/23/2018   VLDL 12 10/23/2018   LDLCALC 67 10/23/2018    Physical Findings: AIMS: Facial and Oral Movements Muscles of Facial Expression: None, normal Lips and Perioral Area: None, normal Jaw: None, normal Tongue: None, normal,Extremity Movements Upper (arms, wrists, hands, fingers): None, normal Lower (legs, knees, ankles, toes): None, normal, Trunk Movements Neck, shoulders, hips: None, normal, Overall Severity Severity of abnormal movements (highest score from questions above): None, normal Incapacitation due to abnormal movements: None, normal Patient's awareness of abnormal movements (rate only patient's report): No Awareness, Dental Status Current problems with teeth and/or dentures?: No Does patient usually wear dentures?: No  CIWA:  CIWA-Ar Total: 0 COWS:  COWS Total Score: 0    Musculoskeletal: Strength & Muscle Tone: within normal limits Gait & Station: normal Patient leans: N/A  Psychiatric Specialty Exam: Physical Exam  ROS  Blood pressure 118/78, pulse 63, temperature 98.5 F (36.9 C), temperature source Oral, resp. rate 18, height  (1.803 m), weight 88.5 kg, SpO2 99 %.Body mass  index is 27.2 kg/m.  General Appearance: Casual and Fairly Groomed  Eye Contact:  Good  Speech:  Normal Rate  Volume:  Normal  Mood:  Euthymic  Affect:  Appropriate and Congruent  Thought Process:  Coherent  Orientation:  Full (Time, Place, and Person)  Thought Content:  Logical  Suicidal Thoughts:  No  Homicidal Thoughts:  No  Memory:  Immediate;   Fair Recent;   Fair Remote;   Fair  Judgement:  Fair  Insight:  Fair  Psychomotor Activity:  Normal  Concentration:  Concentration: Fair and Attention Span: Fair  Recall:  [employer] of Knowledge:  Fair  Language:  Fair  Akathisia:  No  Handed:  Right  AIMS (if indicated):  Assets:  Communication Skills Desire for Improvement Financial Resources/Insurance Physical Health Resilience Social Support  ADL's:  Intact  Cognition:  WNL  Sleep:  Number of Hours: 6.5     Treatment Plan Summary: Daily contact with patient to assess and evaluate symptoms and progress in treatment   33yo M, who [facility] due to exacerbation of Schizoaffective disorder, particulary mania and psychosis.  Patient is calm, cooperative, he participates in unit activities, interactive with staff and other patients, his behavior is appropriate. He denies any mental or physical complaints. No thoughts of harming self or others. It appears, that his insight and judgement are limited. No indication for medication changes today.  Impression: Schizoaffective disorder, bipolar type, current episode - acute mania.  Plan: -continue inpatient psych admission; 15-minute checks; daily contact with patient to assess and evaluate symptoms and progress in treatment; psychoeducation.  -continue scheduled psych medications: Abilify Maintena 400mg  administered on 12/08/18; continue Abilify 15mg  PO daily for 2 more weeks after IM LAI. Zyprexa 30mg  PO at bedtime Depakote 1000mg  PO at bedtime Hydroxyzine 50mg  PO !4H PRN  anxiety. Trazodone 100mg  PO QHS PRN sleep.  -continue PRN medications.  -Disposition: to be determined. Social Workworking on discharge planning for as soon as possible but delayed due to group home placement need.    Thalia PartyAlisa Raji Glinski, MD 12/12/2018, 9:04 AM

## 2018-12-12 NOTE — Plan of Care (Signed)
Pt denies SI, HI, AVH, depression and anxiety. Pt was educated on care plan and verbalizes understanding, Torrie Mayers RN Problem: Education: Goal: Knowledge of [facility] Education information/materials will improve Outcome: Progressing   Problem: Coping: Goal: Ability to verbalize frustrations and anger appropriately will improve Outcome: Progressing Goal: Ability to demonstrate self-control will improve Outcome: Progressing   Problem: Safety: Goal: Periods of time without injury will increase Outcome: Progressing   Problem: Education: Goal: Will be free of psychotic symptoms Outcome: Progressing Goal: Knowledge of the prescribed therapeutic regimen will improve Outcome: Progressing   Problem: Self-Concept: Goal: Will verbalize positive feelings about self Outcome: Progressing   Problem: Education: Goal: Ability to state activities that reduce stress will improve Outcome: Progressing

## 2018-12-12 NOTE — Progress Notes (Signed)
D: Patient has been pleasant, cooperative and appropriate. No complaints other than that the Depakote pills are difficult to swallow. Affect is bright. Not voicing any delusional content. Denies SI and HI. A: Continue to monitor for safety. R:  Safety maintained.

## 2018-12-13 NOTE — Progress Notes (Signed)
D: Patient is pleasant and cooperative. Affect is bright. Mood is pleasant. Denies SI, HI and AV hallucinations. Medication compliant. A: Continue to monitor for safety. R: Safety maintained.

## 2018-12-13 NOTE — Plan of Care (Signed)
No issues verbalized.Patient is appropriate in the unit.

## 2018-12-13 NOTE — Plan of Care (Signed)
  Problem: Coping: Goal: Ability to verbalize frustrations and anger appropriately will improve Outcome: Progressing Goal: Ability to demonstrate self-control will improve Outcome: Progressing   D: Patient is pleasant and cooperative. Affect is bright. Mood is pleasant. Denies SI, HI and AV hallucinations. Medication compliant. A: Continue to monitor for safety. R: Safety maintained.

## 2018-12-13 NOTE — BHH Counselor (Signed)
[facility], [phone] CSW faxed information to [place] at [phone].   [facility], [phone] CSW faxed information to [place] [phone].  [employer], [phone] CSW faxed psych notes and FL2 to [phone].  [employer] [employer], [phone] AND [employer] [employer], [phone]  CSW faxed psych note and FL2 to attn: [place].  CSW notes that she received confirmation that all faxes were successful and will follow up with referrals on 12/14/2018.  Penni Homans, MSW, LCSW 12/13/2018 4:15 PM

## 2018-12-13 NOTE — Progress Notes (Signed)
[facility] MD Progress Note  12/13/2018 3:52 PM Jackson [name]  MRN:  [id number] Subjective: Patient seen chart reviewed.  Patient known from previous encounters.  This is a gentleman with schizoaffective disorder.  He is currently quite stable.  No complaints.  Pleasant and appropriate.  Taking his medicine.  Physically well.  Treatment team is working on finding a group home. Principal Problem: Schizoaffective disorder, bipolar type (HCC) Diagnosis: Principal Problem:   Schizoaffective disorder, bipolar type (HCC) Active Problems:   Noncompliance  Total Time spent with patient: 20 minutes  Past Psychiatric History: Patient has a long history of bipolar disorder and history of long hospitalizations in the past as well  Past Medical History:  Past Medical History:  Diagnosis Date  . Bipolar 1 disorder (HCC)   . Schizoaffective disorder (HCC)    History reviewed. No pertinent surgical history. Family History: History reviewed. No pertinent family history. Family Psychiatric  History: None reported Social History:  Social History   Substance and Sexual Activity  Alcohol Use Not on file     Social History   Substance and Sexual Activity  Drug Use Not on file    Social History   Socioeconomic History  . Marital status: Single    Spouse name: Not on file  . Number of children: Not on file  . Years of education: Not on file  . Highest education level: Not on file  Occupational History  . Not on file  Social Needs  . Financial resource strain: Not on file  . Food insecurity:    Worry: Not on file    Inability: Not on file  . Transportation needs:    Medical: Not on file    Non-medical: Not on file  Tobacco Use  . Smoking status: Light Tobacco Smoker    Packs/day: 0.25    Types: Cigarettes  . Smokeless tobacco: Never Used  Substance and Sexual Activity  . Alcohol use: Not on file  . Drug use: Not on file  . Sexual activity: Not on file  Lifestyle  . Physical activity:     Days per week: Not on file    Minutes per session: Not on file  . Stress: Not on file  Relationships  . Social connections:    Talks on phone: Not on file    Gets together: Not on file    Attends religious service: Not on file    Active member of club or organization: Not on file    Attends meetings of clubs or organizations: Not on file    Relationship status: Not on file  Other Topics Concern  . Not on file  Social History Narrative  . Not on file   Additional Social History:                         Sleep: Fair  Appetite:  Fair  Current Medications: Current Facility-Administered Medications  Medication Dose Route Frequency Provider Last Rate Last Dose  . acetaminophen (TYLENOL) tablet 650 mg  650 mg Oral Q6H PRN [name], MD   650 mg at 10/26/18 2302  . alum & mag hydroxide-simeth (MAALOX/MYLANTA) 200-200-20 MG/5ML suspension 30 mL  30 mL Oral Q4H PRN [name], MD   30 mL at 10/26/18 2129  . ARIPiprazole (ABILIFY) tablet 15 mg  15 mg Oral QHS Charm Rings, NP   15 mg at 12/12/18 2104  . ARIPiprazole ER (ABILIFY MAINTENA) injection 400 mg  400  mg Intramuscular Q28 days Charm RingsLord, Jamison Y, NP   400 mg at 12/08/18 2217  . divalproex (DEPAKOTE ER) 24 hr tablet 1,000 mg  1,000 mg Oral QHS [name], [name], MD   1,000 mg at 12/12/18 2103  . hydrOXYzine (ATARAX/VISTARIL) tablet 50 mg  50 mg Oral Q4H PRN Terance HartStephens, [name], MD   50 mg at 10/28/18 [id number]  . Influenza vac split quadrivalent PF (FLUARIX) injection 0.5 mL  0.5 mL Intramuscular Once Charm RingsLord, Jamison Y, NP      . magnesium hydroxide (MILK OF MAGNESIA) suspension 30 mL  30 mL Oral Daily PRN Terance HartStephens, [name], MD      . OLANZapine [facility](ZYPREXA) injection 10 mg  10 mg Intramuscular Q6H PRN [name], [name], MD      . OLANZapine (ZYPREXA) tablet 30 mg  30 mg Oral QHS Charm RingsLord, Jamison Y, NP   30 mg at 12/12/18 2105  . traZODone (DESYREL) tablet 100 mg  100 mg Oral QHS PRN Terance HartStephens, [name], MD   100 mg  at 11/26/18 2123    Lab Results: No results found for this or any previous visit (from the past 48 hour(s)).  Blood Alcohol level:  Lab Results  Component Value Date   ETH <10 10/16/2018    Metabolic Disorder Labs: Lab Results  Component Value Date   HGBA1C 5.2 10/23/2018   MPG 102.54 10/23/2018   No results found for: PROLACTIN Lab Results  Component Value Date   CHOL 129 10/23/2018   TRIG 58 10/23/2018   HDL 50 10/23/2018   CHOLHDL 2.6 10/23/2018   VLDL 12 10/23/2018   LDLCALC 67 10/23/2018    Physical Findings: AIMS: Facial and Oral Movements Muscles of Facial Expression: None, normal Lips and Perioral Area: None, normal Jaw: None, normal Tongue: None, normal,Extremity Movements Upper (arms, wrists, hands, fingers): None, normal Lower (legs, knees, ankles, toes): None, normal, Trunk Movements Neck, shoulders, hips: None, normal, Overall Severity Severity of abnormal movements (highest score from questions above): None, normal Incapacitation due to abnormal movements: None, normal Patient's awareness of abnormal movements (rate only patient's report): No Awareness, Dental Status Current problems with teeth and/or dentures?: No Does patient usually wear dentures?: No  CIWA:  CIWA-Ar Total: 0 COWS:  COWS Total Score: 0  Musculoskeletal: Strength & Muscle Tone: within normal limits Gait & Station: normal Patient leans: N/A  Psychiatric Specialty Exam: Physical Exam  Nursing note and vitals reviewed. Constitutional: He appears well-developed and well-nourished.  HENT:  Head: Normocephalic and atraumatic.  Eyes: Pupils are equal, round, and reactive to light. Conjunctivae are normal.  Neck: Normal range of motion.  Cardiovascular: Regular rhythm and normal heart sounds.  Respiratory: Effort normal. No respiratory distress.  GI: Soft.  Musculoskeletal: Normal range of motion.  Neurological: He is alert.  Skin: Skin is warm and dry.  Psychiatric: He has a  normal mood and affect. His behavior is normal. Judgment and thought content normal.    Review of Systems  Constitutional: Negative.   HENT: Negative.   Eyes: Negative.   Respiratory: Negative.   Cardiovascular: Negative.   Gastrointestinal: Negative.   Musculoskeletal: Negative.   Skin: Negative.   Neurological: Negative.   Psychiatric/Behavioral: Negative.     Blood pressure 99/71, pulse 81, temperature 97.9 F (36.6 C), temperature source Oral, resp. rate 18, height 5\' 11"  (1.803 m), weight 88.5 kg, SpO2 100 %.Body mass index is 27.2 kg/m.  General Appearance: Casual  Eye Contact:  Good  Speech:  Clear and  Coherent  Volume:  Normal  Mood:  Euthymic  Affect:  Congruent  Thought Process:  Goal Directed  Orientation:  Full (Time, Place, and Person)  Thought Content:  Logical  Suicidal Thoughts:  No  Homicidal Thoughts:  No  Memory:  Immediate;   Fair Recent;   Fair Remote;   Fair  Judgement:  Fair  Insight:  Fair  Psychomotor Activity:  Decreased  Concentration:  Concentration: Fair  Recall:  [employer] of Knowledge:  Fair  Language:  Fair  Akathisia:  No  Handed:  Right  AIMS (if indicated):     Assets:  Desire for Improvement Physical Health  ADL's:  Intact  Cognition:  WNL  Sleep:  Number of Hours: 4     Treatment Plan Summary: Daily contact with patient to assess and evaluate symptoms and progress in treatment, Medication management and Plan Stable patient on combination of Zyprexa and Depakote.  No acute side effects or symptoms.  Treatment team working on finding a safe place for him to live.  Mordecai Rasmussen, MD 12/13/2018, 3:52 PM

## 2018-12-13 NOTE — Social Work (Signed)
Mental Health Facilities called in search of placement  [facility]   [phone]    no answer  [facility]  [phone]   Left message  for Mr. Dan Humphreys Clinical research associate)

## 2018-12-13 NOTE — BHH Counselor (Signed)
CSW called to follow up on group homes contacted on 12/10/2018:  [facility], [phone] CSW attempted to call and check on male beds.  Phone rang repeatedly without option to leave a voicemail.    [address], [employer], [phone] Not a working number.    [facility], [phone] Have one male bed. Needed information:  Psych notes and FL2.  Information needs to be faxed to [phone].   [facility] [place], [place], [phone] CSW called but the number had a fax machine like sound.   [facility], [phone] CSW attempted to call and check on male beds.  CSW had to leave a HIPAA compliant voicemail requesting a return phone call.     [facility], [phone] No answer, CSW left HIPAA compliant voicemail    [facility], [phone] CSW had conversation with QP Dylan. Video conference was scheduled for 10:30AM on 12/14/2018.   [facility]    [phone]    CSW has attempted to call this group home and have been unable to reach the owner each time.  CSW always get the response "she has the referral, call back".  Every time CSW does call back she is told "you just missed her".  CSW will no longer contact.    [facility], [phone] Number is not in service.   [facility], [phone] Number rang with no option of voicemail, no one answered.    [facility], [phone] Number rang with no option of voicemail, no one answered.   CSW notes that mother has not returned the releases sent on 12/10/2018 yet so at this time, no referral can be sent.  Mother is aware of this.  This is the second time CSW has attempted to call these numbers, several will be removed from the call list.    Penni Homans, MSW, LCSW 12/13/2018 11:27 AM

## 2018-12-13 NOTE — BHH Counselor (Signed)
CSW received a call from [facility] seeing if the patient remained on the unit and in need of a bed.  CSW spoke with Chrissie Noa and confirmed that pt remains on the unit and needs a bed.  Penni Homans, MSW, LCSW 12/13/2018 9:19 AM

## 2018-12-13 NOTE — BHH Counselor (Signed)
CSW called the following counties and group homes to identify if any male beds are open:  [facility]: [employer], [phone] CSW called but phone does not have a voicemail box set up.   [facility]: [facility], [phone] CSW called.  Unable to speak with someone or leave voicemail as voicemail box was full.  [facility]: None listed.  [facility]: None listed.   [facility]: [address] [place], [phone] CSW was asked to call Erma Pinto the Admissions Coordinator at [phone].  CSW called the number and left a VM requesting return call.   [facility], [phone] CSW called and was asked to call [phone] and press option 1.  CSW did so and was not able to speak with someone.  CSW left HIPAA compliant voicemail asking for a return call.   [facility] [place]: [address], [phone] Number is not working at this time.   [place]: [employer], [phone] CSW was informed that there are male beds. CSW was asked to fax information [facility] and psych eval) [phone].   [facility], [phone] Phone rang continuously with no option to leave a VM.   [facility], [phone] No beds at this time.  [employer], [phone] Phone rang continuously with no option to leave a VM.   [facility], [phone] Phone rang continuously with no option to leave a VM.    [place], [place], [place], [phone] Male beds are open.  Fax information to Francena Hanly [phone]  [facility], [phone] Phone rang continuously with no option to leave a VM.   [facility] [place], [place], [phone] CSW spoke with a worker who took CSW's information to send to the owner who will call this CSW back.   [facility], [phone] CSW left HIPAA compliant VM.  [employer], [phone] Male beds available.  Worker unable to provide owner information so CSW provided  her contact information and asked for a return call.   [facility], [phone] No males beds, home is full.   [facility], [phone] No male beds available.   [facility], [place], [phone] No males beds, home is full.    [facility] two, [phone]  CSW left HIPAA compliant voicemail requesting call back at 2:03PM  [facility], [phone] No beds available  [facility], [phone]  CSW left HIPAA compliant voicemail requesting call back at 2:03PM  CSW notes that she has previously called group homes in [place], [place] and [place] counties.    Penni Homans, MSW, LCSW 12/13/2018 2:20 PM

## 2018-12-13 NOTE — BHH Group Notes (Signed)
BHH Group Notes:  (Nursing/MHT/Case Management/Adjunct)  Date:  12/13/2018  Time:  9:05 PM  Type of Therapy:  Group Therapy  Participation Level:  Active  Participation Quality:  Appropriate  Affect:  Appropriate  Cognitive:  Alert  Insight:  Good  Engagement in Group:  Engaged  Modes of Intervention:  Support  Summary of Progress/Problems:  Jackson [name] 12/13/2018, 9:05 PM

## 2018-12-13 NOTE — BHH Counselor (Signed)
CSW spoke with the Vertell Limber at the group home who declined the patient at this time stating that "he ain't gonna like my house I have rules and stuff.  I talked to his mother for a long time on the day that we talked and we both kind of agreed that he wouldn't like my home."  Penni Homans, MSW, LCSW 12/13/2018 9:50 AM

## 2018-12-13 NOTE — Tx Team (Signed)
Interdisciplinary Treatment and Diagnostic Plan Update  12/13/2018 Time of Session: 8:30AM  Jackson [name] MRN: [id number]  Principal Diagnosis: Schizoaffective disorder, bipolar type [facility](HCC)  Secondary Diagnoses: Principal Problem:   Schizoaffective disorder, bipolar type (HCC) Active Problems:   Noncompliance   Current Medications:  Current Facility-Administered Medications  Medication Dose Route Frequency Provider Last Rate Last Dose  . acetaminophen (TYLENOL) tablet 650 mg  650 mg Oral Q6H PRN Terance HartStephens, [name], MD   650 mg at 10/26/18 2302  . alum & mag hydroxide-simeth (MAALOX/MYLANTA) 200-200-20 MG/5ML suspension 30 mL  30 mL Oral Q4H PRN Terance HartStephens, [name], MD   30 mL at 10/26/18 2129  . ARIPiprazole (ABILIFY) tablet 15 mg  15 mg Oral QHS Charm RingsLord, Jamison Y, NP   15 mg at 12/12/18 2104  . ARIPiprazole ER (ABILIFY MAINTENA) injection 400 mg  400 mg Intramuscular Q28 days Charm RingsLord, Jamison Y, NP   400 mg at 12/08/18 2217  . divalproex (DEPAKOTE ER) 24 hr tablet 1,000 mg  1,000 mg Oral QHS [name], [name], MD   1,000 mg at 12/12/18 2103  . hydrOXYzine (ATARAX/VISTARIL) tablet 50 mg  50 mg Oral Q4H PRN Terance HartStephens, [name], MD   50 mg at 10/28/18 [id number]  . Influenza vac split quadrivalent PF (FLUARIX) injection 0.5 mL  0.5 mL Intramuscular Once Charm RingsLord, Jamison Y, NP      . magnesium hydroxide (MILK OF MAGNESIA) suspension 30 mL  30 mL Oral Daily PRN Terance HartStephens, [name], MD      . OLANZapine [facility](ZYPREXA) injection 10 mg  10 mg Intramuscular Q6H PRN [name], [name], MD      . OLANZapine (ZYPREXA) tablet 30 mg  30 mg Oral QHS Charm RingsLord, Jamison Y, NP   30 mg at 12/12/18 2105  . traZODone (DESYREL) tablet 100 mg  100 mg Oral QHS PRN Terance HartStephens, [name], MD   100 mg at 11/26/18 2123   PTA Medications: No medications prior to admission.    Patient Stressors: Health problems Medication change or noncompliance  Patient Strengths: Manufacturing systems engineerCommunication skills Religious Affiliation Supportive  family/friends  Treatment Modalities: Medication Management, Group therapy, Case management,  1 to 1 session with clinician, Psychoeducation, Recreational therapy.   Physician Treatment Plan for Primary Diagnosis: Schizoaffective disorder, bipolar type (HCC) Long Term Goal(s): Improvement in symptoms so as ready for discharge Improvement in symptoms so as ready for discharge   Short Term Goals: Ability to demonstrate self-control will improve Ability to identify and develop effective coping behaviors will improve Compliance with prescribed medications will improve  Medication Management: Evaluate patient's response, side effects, and tolerance of medication regimen.  Therapeutic Interventions: 1 to 1 [place], Unit Group [place] and Medication administration.  Evaluation of Outcomes: Progressing  Physician Treatment Plan for Secondary Diagnosis: Principal Problem:   Schizoaffective disorder, bipolar type (HCC) Active Problems:   Noncompliance  Long Term Goal(s): Improvement in symptoms so as ready for discharge Improvement in symptoms so as ready for discharge   Short Term Goals: Ability to demonstrate self-control will improve Ability to identify and develop effective coping behaviors will improve Compliance with prescribed medications will improve     Medication Management: Evaluate patient's response, side effects, and tolerance of medication regimen.  Therapeutic Interventions: 1 to 1 [place], Unit Group Corzine and Medication administration.  Evaluation of Outcomes: Progressing   RN Treatment Plan for Primary Diagnosis: Schizoaffective disorder, bipolar type (HCC) Long Term Goal(s): Knowledge of disease and therapeutic regimen to maintain health will improve  Short Term Goals:  Ability to verbalize frustration and anger appropriately will improve, Ability to demonstrate self-control, Ability to participate in decision making will improve and Ability to verbalize  feelings will improve  Medication Management: RN will administer medications as ordered by provider, will assess and evaluate patient's response and provide education to patient for prescribed medication. RN will report any adverse and/or side effects to prescribing provider.  Therapeutic Interventions: 1 on 1 counseling [place], Psychoeducation, Medication administration, Evaluate responses to treatment, Monitor vital signs and CBGs as ordered, Perform/monitor CIWA, COWS, AIMS and Fall Risk screenings as ordered, Perform wound care treatments as ordered.  Evaluation of Outcomes: Progressing   LCSW Treatment Plan for Primary Diagnosis: Schizoaffective disorder, bipolar type (HCC) Long Term Goal(s): Safe transition to appropriate next level of care at discharge, Engage patient in therapeutic group addressing interpersonal concerns.  Short Term Goals: Engage patient in aftercare planning with referrals and resources, Increase social support, Increase ability to appropriately verbalize feelings, Increase emotional regulation and Facilitate acceptance of mental health diagnosis and concerns  Therapeutic Interventions: Assess for all discharge needs, 1 to 1 time with Social worker, Explore available resources and support systems, Assess for adequacy in community support network, Educate family and significant other(s) on suicide prevention, Complete Psychosocial Assessment, Interpersonal group therapy.  Evaluation of Outcomes: Progressing   Progress in Treatment: Attending groups: No. Participating in groups: No. Taking medication as prescribed: Yes.   Toleration medication: Yes. Family/Significant other contact made: Yes, individual(s) contacted:  CSW completed SPE with the patients mother.  Patient understands diagnosis: Yes. Discussing patient identified problems/goals with staff: Yes. Medical problems stabilized or resolved: Yes. Denies suicidal/homicidal ideation: Yes. Issues/concerns  per patient self-inventory: No. Other: none  New problem(s) identified: No, Describe:  none  New Short Term/Long Term Goal(s): elimination of symptoms of psychosis, medication management for mood stabilization; elimination of SI thoughts; development of comprehensive mental wellness plan.  Patient Goals:  "independence"  Discharge Plan or Barriers: Pt's mother is his legal guardian.  Mother has agreed to referrals to group homes following a staffing with Nanine Means, NP and Dr. Lucianne Muss.  CSW has sent referrals to group homes in [place], [place] and [place] counties.  Patient remains on the waitlist for [facility] as well.  All requested information has been sent.  CSW will continue to follow up and send information as needed.      Reason for Continuation of Hospitalization: Aggression Delusions  Hallucinations Mania Medication stabilization  Estimated Length of Stay: TBD  Recreational Therapy: Patient Stressors: N/A Patient Goal: Patient will focus on task/topic with 2 prompts from staff within 5 recreation therapy group [place]  Attendees: Patient:  12/13/2018 9:30 AM  Physician: Dr. Toni Amend, MD 12/13/2018 9:30 AM  Nursing:  12/13/2018 9:30 AM  RN Care Manager: 12/13/2018 9:30 AM  Social Worker: Penni Homans, LCSW 12/13/2018 9:30 AM  Recreational Therapist:  12/13/2018 9:30 AM  Other:  12/13/2018 9:30 AM  Other:  12/13/2018 9:30 AM  Other: 12/13/2018 9:30 AM    Scribe for Treatment Team: Harden Mo, LCSW 12/13/2018 9:30 AM

## 2018-12-13 NOTE — BHH Group Notes (Signed)
Overcoming Obstacles  12/13/2018 1PM  Type of Therapy and Topic:  Group Therapy:  Overcoming Obstacles  Participation Level:  Did Not Attend    Description of Group:    In this group patients will be encouraged to explore what they see as obstacles to their own wellness and recovery. They will be guided to discuss their thoughts, feelings, and behaviors related to these obstacles. The group will process together ways to cope with barriers, with attention given to specific choices patients can make. Each patient will be challenged to identify changes they are motivated to make in order to overcome their obstacles. This group will be process-oriented, with patients participating in exploration of their own experiences as well as giving and receiving support and challenge from other group members.   Therapeutic Goals: 1. Patient will identify personal and current obstacles as they relate to admission. 2. Patient will identify barriers that currently interfere with their wellness or overcoming obstacles.  3. Patient will identify feelings, thought process and behaviors related to these barriers. 4. Patient will identify two changes they are willing to make to overcome these obstacles:      Summary of Patient Progress     Therapeutic Modalities:   Cognitive Behavioral Therapy Solution Focused Therapy Motivational Interviewing Relapse Prevention Therapy    Myleah Cavendish, MSW, LCSW 12/13/2018 1:59 PM  

## 2018-12-14 NOTE — Plan of Care (Signed)
  Problem: Education: Goal: Knowledge of [facility] Education information/materials will improve Outcome: Progressing   Problem: Coping: Goal: Ability to verbalize frustrations and anger appropriately will improve Outcome: Progressing Goal: Ability to demonstrate self-control will improve Outcome: Progressing   Problem: Safety: Goal: Periods of time without injury will increase Outcome: Progressing   Problem: Education: Goal: Will be free of psychotic symptoms Outcome: Progressing Goal: Knowledge of the prescribed therapeutic regimen will improve Outcome: Progressing   Problem: Self-Concept: Goal: Will verbalize positive feelings about self Outcome: Progressing   Problem: Education: Goal: Ability to state activities that reduce stress will improve Outcome: Progressing

## 2018-12-14 NOTE — Progress Notes (Signed)
Recreation Therapy Notes    Date: 12/14/2018   Time: 9:30 am   Location: Craft room   Behavioral response: N/A   Intervention Topic: Self-care   Discussion/Intervention: Patient did not attend group.   Clinical Observations/Feedback:  Patient did not attend group.   Hogan Hoobler LRT/CTRS        Nuh Lipton 12/14/2018 10:28 AM

## 2018-12-14 NOTE — Plan of Care (Signed)
Patient is alert and oriented X 4 denies SI, HI and AVH. Patient is cooperative and pleasant. No self harming observations, patient is appropriate on the unit with staff and peers. Patient denies pain 0/10. Patient verbalizes understanding  of therapeutic regimen. Patient will continue to be monitored and safety checks Q 15 minutes to continue. Problem: Education: Goal: Knowledge of [place] [facility] Education information/materials will improve Outcome: Progressing   Problem: Coping: Goal: Ability to verbalize frustrations and anger appropriately will improve Outcome: Progressing Goal: Ability to demonstrate self-control will improve Outcome: Progressing   Problem: Safety: Goal: Periods of time without injury will increase Outcome: Progressing   Problem: Education: Goal: Will be free of psychotic symptoms Outcome: Progressing Goal: Knowledge of the prescribed therapeutic regimen will improve Outcome: Progressing

## 2018-12-14 NOTE — BHH Counselor (Signed)
CSW held phone conference with the patient and the group home manager at [facility], Tora Perches.  CSW observed the following to be discussed: -curfew -responsibilities in the home -laundry -location -expectations while in the group home -independence -day program -seeing the primary care physician the group home typically uses.   CSW observed the patient to be somewhat appropriate when answering questions, however, he appeared to have some sort of flight of ideas or disorganized thinking, as evidenced by the patient not answering the questions asked but would display tangential thinking in discussing some slight or wrong he feels has been done to him instead.  Patient required a lot of redirection to properly respond to questions asked.    Group home manager did state that he is concerned with the patients apparent determination to "out and about during the day", as the patient appeared reluctant when told that he needed to be in the home for 15 days and evaluated by the group home QP before determination to be outside could be given for appropriateness to be outside unspervised.  He did recommend that if group homes do not accept patient to try family care homes.   He reports that he will staff with his aunt the group home owner and then follow up with this CSW.    Penni Homans, MSW, LCSW 12/14/2018 12:58 PM

## 2018-12-14 NOTE — BHH Counselor (Signed)
CSW called to follow up previous day's faxes of potential group homes:  [facility], [phone] CSW called, voicemail box is full unable to leave message.   [facility], [phone] CSW spoke with Fritzi Mandes who has not reviewed it at this time, he will review and call CSW back.  [facility], [phone] Group home has not reviewed and will return phone call.   [facility], [phone] AND [facility], [phone]  Confirmed receipt of fax with [place], [phone], he has not reviewed at this time.  Penni Homans, MSW, LCSW 12/14/2018 1:43 PM

## 2018-12-14 NOTE — BHH Counselor (Signed)
CSW was able to get ahold of [employer] at several group homes.  CSW was informed that Ms. Jackson [name] allows her daughter Jackson [name] at [facility] to make decisions on male residents while she handles females.  Her daughter previously declined the patient.   Penni Homans, MSW, LCSW 12/14/2018 9:32 AM

## 2018-12-14 NOTE — Progress Notes (Signed)
[facility]  12/14/2018 3:35 PM Jackson [name]  MRN:  [id number] Subjective: Follow-up for this patient with schizoaffective disorder.  Patient has no new complaints.  Mood is stable.  Behavior calm.  Denies suicidal or homicidal thoughts.  Not expressing any anger or irritability.  No new physical complaints.  Awaiting discharge plans.  Reviewing medicines there were some changes made in my absence and he is now on 2 antipsychotics.  That plus the Depakote.  Looks like the plan was probably to discharge him on both the Abilify maintain and the Zyprexa. Principal Problem: Schizoaffective disorder, bipolar type (HCC) Diagnosis: Principal Problem:   Schizoaffective disorder, bipolar type (HCC) Active Problems:   Noncompliance  Total Time spent with patient: 30 minutes  Past Psychiatric History: Patient has a history of schizoaffective disorder many prior hospitalizations history of frequent mania  Past Medical History:  Past Medical History:  Diagnosis Date  . Bipolar 1 disorder (HCC)   . Schizoaffective disorder (HCC)    History reviewed. No pertinent surgical history. Family History: History reviewed. No pertinent family history. Family Psychiatric  History: None known Social History:  Social History   Substance and Sexual Activity  Alcohol Use Not on file     Social History   Substance and Sexual Activity  Drug Use Not on file    Social History   Socioeconomic History  . Marital status: Single    Spouse name: Not on file  . Number of children: Not on file  . Years of education: Not on file  . Highest education level: Not on file  Occupational History  . Not on file  Social Needs  . Financial resource strain: Not on file  . Food insecurity:    Worry: Not on file    Inability: Not on file  . Transportation needs:    Medical: Not on file    Non-medical: Not on file  Tobacco Use  . Smoking status: Light Tobacco Smoker    Packs/day: 0.25    Types:  Cigarettes  . Smokeless tobacco: Never Used  Substance and Sexual Activity  . Alcohol use: Not on file  . Drug use: Not on file  . Sexual activity: Not on file  Lifestyle  . Physical activity:    Days per week: Not on file    Minutes per session: Not on file  . Stress: Not on file  Relationships  . Social connections:    Talks on phone: Not on file    Gets together: Not on file    Attends religious service: Not on file    Active member of club or organization: Not on file    Attends meetings of clubs or organizations: Not on file    Relationship status: Not on file  Other Topics Concern  . Not on file  Social History Narrative  . Not on file   Additional Social History:                         Sleep: Fair  Appetite:  Good  Current Medications: Current Facility-Administered Medications  Medication Dose Route Frequency Provider Last Rate Last Dose  . acetaminophen (TYLENOL) tablet 650 mg  650 mg Oral Q6H PRN Terance HartStephens, [name], MD   650 mg at 10/26/18 2302  . alum & mag hydroxide-simeth (MAALOX/MYLANTA) 200-200-20 MG/5ML suspension 30 mL  30 mL Oral Q4H PRN Terance HartStephens, [name], MD   30 mL at 10/26/18 2129  . ARIPiprazole (  ABILIFY) tablet 15 mg  15 mg Oral QHS Charm Rings, NP   15 mg at 12/13/18 2126  . ARIPiprazole ER (ABILIFY MAINTENA) injection 400 mg  400 mg Intramuscular Q28 days Charm Rings, NP   400 mg at 12/08/18 2217  . divalproex (DEPAKOTE ER) 24 hr tablet 1,000 mg  1,000 mg Oral QHS Arabella Revelle T, MD   1,000 mg at 12/13/18 2125  . hydrOXYzine (ATARAX/VISTARIL) tablet 50 mg  50 mg Oral Q4H PRN [name], MD   50 mg at 10/28/18 7829  . Influenza vac split quadrivalent PF (FLUARIX) injection 0.5 mL  0.5 mL Intramuscular Once Charm Rings, NP      . magnesium hydroxide (MILK OF MAGNESIA) suspension 30 mL  30 mL Oral Daily PRN [name], MD      . OLANZapine [facility]) injection 10 mg  10 mg Intramuscular Q6H PRN Ilo Beamon T,  MD      . OLANZapine (ZYPREXA) tablet 30 mg  30 mg Oral QHS Charm Rings, NP   30 mg at 12/13/18 2125  . traZODone (DESYREL) tablet 100 mg  100 mg Oral QHS PRN [name], MD   100 mg at 11/26/18 2123    Lab Results: No results found for this or any previous visit (from the past 48 hour(s)).  Blood Alcohol level:  Lab Results  Component Value Date   ETH <10 10/16/2018    Metabolic Disorder Labs: Lab Results  Component Value Date   HGBA1C 5.2 10/23/2018   MPG 102.54 10/23/2018   No results found for: PROLACTIN Lab Results  Component Value Date   CHOL 129 10/23/2018   TRIG 58 10/23/2018   HDL 50 10/23/2018   CHOLHDL 2.6 10/23/2018   VLDL 12 10/23/2018   LDLCALC 67 10/23/2018    Physical Findings: AIMS: Facial and Oral Movements Muscles of Facial Expression: None, normal Lips and Perioral Area: None, normal Jaw: None, normal Tongue: None, normal,Extremity Movements Upper (arms, wrists, hands, fingers): None, normal Lower (legs, knees, ankles, toes): None, normal, Trunk Movements Neck, shoulders, hips: None, normal, Overall Severity Severity of abnormal movements (highest score from questions above): None, normal Incapacitation due to abnormal movements: None, normal Patient's awareness of abnormal movements (rate only patient's report): No Awareness, Dental Status Current problems with teeth and/or dentures?: No Does patient usually wear dentures?: No  CIWA:  CIWA-Ar Total: 0 COWS:  COWS Total Score: 0  Musculoskeletal: Strength & Muscle Tone: within normal limits Gait & Station: normal Patient leans: N/A  Psychiatric Specialty Exam: Physical Exam  Nursing note and vitals reviewed. Constitutional: He appears well-developed and well-nourished.  HENT:  Head: Normocephalic and atraumatic.  Eyes: Pupils are equal, round, and reactive to light. Conjunctivae are normal.  Neck: Normal range of motion.  Cardiovascular: Regular rhythm and normal heart  sounds.  Respiratory: Effort normal. No respiratory distress.  GI: Soft.  Musculoskeletal: Normal range of motion.  Neurological: He is alert.  Skin: Skin is warm and dry.  Psychiatric: He has a normal mood and affect. His behavior is normal. Judgment and thought content normal.    Review of Systems  Constitutional: Negative.   HENT: Negative.   Eyes: Negative.   Respiratory: Negative.   Cardiovascular: Negative.   Gastrointestinal: Negative.   Musculoskeletal: Negative.   Skin: Negative.   Neurological: Negative.   Psychiatric/Behavioral: Negative.     Blood pressure 104/73, pulse 77, temperature 97.6 F (36.4 C), temperature source Oral, resp. rate  18, height  (1.803 m), weight 88.5 kg, SpO2 98 %.Body mass index is 27.2 kg/m.  General Appearance: Casual  Eye Contact:  Good  Speech:  Clear and Coherent  Volume:  Normal  Mood:  Euthymic  Affect:  Congruent  Thought Process:  Goal Directed  Orientation:  Full (Time, Place, and Person)  Thought Content:  Logical  Suicidal Thoughts:  No  Homicidal Thoughts:  No  Memory:  Immediate;   Fair Recent;   Fair Remote;   Fair  Judgement:  Fair  Insight:  Fair  Psychomotor Activity:  Normal  Concentration:  Concentration: Fair  Recall:  [employer] of Knowledge:  Fair  Language:  Fair  Akathisia:  No  Handed:  Right  AIMS (if indicated):     Assets:  Desire for Improvement Housing Physical Health  ADL's:  Intact  Cognition:  WNL  Sleep:  Number of Hours: 3.45     Treatment Plan Summary: Daily contact with patient to assess and evaluate symptoms and progress in treatment, Medication management and Plan Patient appears to be stable.  No plan to change any medication at this point.  We are searching for a group home and hope for discharge relatively soon.  Mordecai Rasmussen, MD 12/14/2018, 3:35 PM

## 2018-12-14 NOTE — Progress Notes (Signed)
Patient participated in groups active and appropriate and responding well to treatment regimen,patient is complying with his medications, with out any side effects, engaging appropriate with staff and a peers , no out burst or violent behaviors , denies any SI/HI AVH and no signs of hallucinations or delusions, patient appear to be bissy righting and  Reading noted, mood and affect is appropriate , coping well in the unit and sleep is intermittent only requiring 15 minutes safety checks for safety no distress.

## 2018-12-14 NOTE — BHH Group Notes (Signed)
  LCSW Group Therapy Note  12/14/2018 11:29 AM   Type of Therapy/Topic:  Group Therapy:  Feelings about Diagnosis  Participation Level:  Did Not Attend   Description of Group:   This group will allow patients to explore their thoughts and feelings about diagnoses they have received. Patients will be guided to explore their level of understanding and acceptance of these diagnoses. Facilitator will encourage patients to process their thoughts and feelings about the reactions of others to their diagnosis and will guide patients in identifying ways to discuss their diagnosis with significant others in their lives. This group will be process-oriented, with patients participating in exploration of their own experiences, giving and receiving support, and processing challenge from other group members.   Therapeutic Goals: 1. Patient will demonstrate understanding of diagnosis as evidenced by identifying two or more symptoms of the disorder 2. Patient will be able to express two feelings regarding the diagnosis 3. Patient will demonstrate their ability to communicate their needs through discussion and/or role play  Summary of Patient Progress: x   Therapeutic Modalities:   Cognitive Behavioral Therapy Brief Therapy Feelings Identification    Aivy Akter, MSW, LCSW Clinical Social Work 12/14/2018 11:29 AM    

## 2018-12-15 NOTE — BHH Counselor (Addendum)
CSW called the following to follow up on group home referrals:  [facility], [phone] Have not reviewed the paperwork.  Will review and call this CSW back.    [facility], [phone] CSW spoke with Fritzi Mandes who again has not reviewed the paperwork and reports he will return the call "shortly".   [facility] [facility], [phone] CSW called and left HIPAA compliant voicemail.   [facility], [phone] AND [facility], [phone]  CSW called and left HIPAA compliant voicemail.   [facility],[phone] CSW called and spoke with Mauri Reading and resent the fax.  Theodoro Grist asked that he call back in the morning, if CSW has not received a call by 10AM CSW is to call and follow up.   Penni Homans, MSW, LCSW 12/15/2018 2:54 PM

## 2018-12-15 NOTE — Plan of Care (Signed)
Patient is alert and oriented X 3, denies SI, HI and AVH. Patient is cooperative on the unit with peers and staff.  Patient is eating meals, attends some groups and sleeping well at night. Patient denies pain this morning 0/10. Patient will continue to be monitored and new progress notes will be added if any changes occur. Problem: Education: Goal: Knowledge of [place] General Education information/materials will improve Outcome: Progressing   Problem: Coping: Goal: Ability to verbalize frustrations and anger appropriately will improve Outcome: Progressing Goal: Ability to demonstrate self-control will improve Outcome: Progressing   Problem: Education: Goal: Will be free of psychotic symptoms Outcome: Progressing Goal: Knowledge of the prescribed therapeutic regimen will improve Outcome: Progressing

## 2018-12-15 NOTE — Plan of Care (Signed)
  Problem: Education: Goal: Knowledge of [place] [facility] Education information/materials will improve Outcome: Progressing   Problem: Coping: Goal: Ability to verbalize frustrations and anger appropriately will improve Outcome: Progressing Goal: Ability to demonstrate self-control will improve Outcome: Progressing   Problem: Safety: Goal: Periods of time without injury will increase Outcome: Progressing   Problem: Education: Goal: Will be free of psychotic symptoms Outcome: Progressing Goal: Knowledge of the prescribed therapeutic regimen will improve Outcome: Progressing   Problem: Self-Concept: Goal: Will verbalize positive feelings about self Outcome: Progressing   Problem: Education: Goal: Ability to state activities that reduce stress will improve Outcome: Progressing   

## 2018-12-15 NOTE — BHH Counselor (Signed)
CSW received return call from [place] at [facility] homes.  The QP has approved the patient for a bed and is hoping to have his boss review the paperwork and give a final answer by Monday at the latest.  [place] reports that mother will need to provide transportation to the group home as they do not pick up patients.    CSW asked about paretns being able to take a virtual tour and [place] reports that this is not possible at this time.  Penni Homans, MSW, LCSW 12/15/2018 4:02 PM

## 2018-12-15 NOTE — Progress Notes (Signed)
[facility] MD Progress Note  12/15/2018 4:34 PM Jackson [name]  MRN:  [id number] Subjective: Patient seen chart reviewed.  No new complaints.  No behavior problems.  No physical problems. Principal Problem: Schizoaffective disorder, bipolar type (HCC) Diagnosis: Principal Problem:   Schizoaffective disorder, bipolar type (HCC) Active Problems:   Noncompliance  Total Time spent with patient: 15 minutes  Past Psychiatric History: Long history of bipolar disorder as documented elsewhere  Past Medical History:  Past Medical History:  Diagnosis Date  . Bipolar 1 disorder (HCC)   . Schizoaffective disorder (HCC)    History reviewed. No pertinent surgical history. Family History: History reviewed. No pertinent family history. Family Psychiatric  History: See previous Social History:  Social History   Substance and Sexual Activity  Alcohol Use Not on file     Social History   Substance and Sexual Activity  Drug Use Not on file    Social History   Socioeconomic History  . Marital status: Single    Spouse name: Not on file  . Number of children: Not on file  . Years of education: Not on file  . Highest education level: Not on file  Occupational History  . Not on file  Social Needs  . Financial resource strain: Not on file  . Food insecurity:    Worry: Not on file    Inability: Not on file  . Transportation needs:    Medical: Not on file    Non-medical: Not on file  Tobacco Use  . Smoking status: Light Tobacco Smoker    Packs/day: 0.25    Types: Cigarettes  . Smokeless tobacco: Never Used  Substance and Sexual Activity  . Alcohol use: Not on file  . Drug use: Not on file  . Sexual activity: Not on file  Lifestyle  . Physical activity:    Days per week: Not on file    Minutes per session: Not on file  . Stress: Not on file  Relationships  . Social connections:    Talks on phone: Not on file    Gets together: Not on file    Attends religious service: Not on file     Active member of club or organization: Not on file    Attends meetings of clubs or organizations: Not on file    Relationship status: Not on file  Other Topics Concern  . Not on file  Social History Narrative  . Not on file   Additional Social History:                         Sleep: Fair  Appetite:  Fair  Current Medications: Current Facility-Administered Medications  Medication Dose Route Frequency Provider Last Rate Last Dose  . acetaminophen (TYLENOL) tablet 650 mg  650 mg Oral Q6H PRN [name], MD   650 mg at 10/26/18 2302  . alum & mag hydroxide-simeth (MAALOX/MYLANTA) 200-200-20 MG/5ML suspension 30 mL  30 mL Oral Q4H PRN [name], MD   30 mL at 10/26/18 2129  . ARIPiprazole (ABILIFY) tablet 15 mg  15 mg Oral QHS Charm Rings, NP   15 mg at 12/14/18 2151  . ARIPiprazole ER (ABILIFY MAINTENA) injection 400 mg  400 mg Intramuscular Q28 days Charm Rings, NP   400 mg at 12/08/18 2217  . divalproex (DEPAKOTE ER) 24 hr tablet 1,000 mg  1,000 mg Oral QHS Brylynn Hanssen T, MD   1,000 mg at 12/14/18 2151  .  hydrOXYzine (ATARAX/VISTARIL) tablet 50 mg  50 mg Oral Q4H PRN Terance HartStephens, [name], MD   50 mg at 10/28/18 [id number]  . Influenza vac split quadrivalent PF (FLUARIX) injection 0.5 mL  0.5 mL Intramuscular Once Charm RingsLord, Jamison Y, NP      . magnesium hydroxide (MILK OF MAGNESIA) suspension 30 mL  30 mL Oral Daily PRN Terance HartStephens, [name], MD      . OLANZapine [facility](ZYPREXA) injection 10 mg  10 mg Intramuscular Q6H PRN Raihana Balderrama T, MD      . OLANZapine (ZYPREXA) tablet 30 mg  30 mg Oral QHS Charm RingsLord, Jamison Y, NP   30 mg at 12/14/18 2150  . traZODone (DESYREL) tablet 100 mg  100 mg Oral QHS PRN Terance HartStephens, [name], MD   100 mg at 11/26/18 2123    Lab Results: No results found for this or any previous visit (from the past 48 hour(s)).  Blood Alcohol level:  Lab Results  Component Value Date   ETH <10 10/16/2018    Metabolic Disorder Labs: Lab Results   Component Value Date   HGBA1C 5.2 10/23/2018   MPG 102.54 10/23/2018   No results found for: PROLACTIN Lab Results  Component Value Date   CHOL 129 10/23/2018   TRIG 58 10/23/2018   HDL 50 10/23/2018   CHOLHDL 2.6 10/23/2018   VLDL 12 10/23/2018   LDLCALC 67 10/23/2018    Physical Findings: AIMS: Facial and Oral Movements Muscles of Facial Expression: None, normal Lips and Perioral Area: None, normal Jaw: None, normal Tongue: None, normal,Extremity Movements Upper (arms, wrists, hands, fingers): None, normal Lower (legs, knees, ankles, toes): None, normal, Trunk Movements Neck, shoulders, hips: None, normal, Overall Severity Severity of abnormal movements (highest score from questions above): None, normal Incapacitation due to abnormal movements: None, normal Patient's awareness of abnormal movements (rate only patient's report): No Awareness, Dental Status Current problems with teeth and/or dentures?: No Does patient usually wear dentures?: No  CIWA:  CIWA-Ar Total: 0 COWS:  COWS Total Score: 0  Musculoskeletal: Strength & Muscle Tone: within normal limits Gait & Station: normal Patient leans: N/A  Psychiatric Specialty Exam: Physical Exam  Nursing note and vitals reviewed. Constitutional: He appears well-developed and well-nourished.  HENT:  Head: Normocephalic and atraumatic.  Eyes: Pupils are equal, round, and reactive to light. Conjunctivae are normal.  Neck: Normal range of motion.  Cardiovascular: Normal heart sounds.  Respiratory: Effort normal.  GI: Soft.  Musculoskeletal: Normal range of motion.  Neurological: He is alert.  Skin: Skin is warm and dry.  Psychiatric: He has a normal mood and affect. His speech is normal and behavior is normal. Judgment and thought content normal. Cognition and memory are normal.    Review of Systems  Constitutional: Negative.   HENT: Negative.   Eyes: Negative.   Respiratory: Negative.   Cardiovascular: Negative.    Gastrointestinal: Negative.   Musculoskeletal: Negative.   Skin: Negative.   Neurological: Negative.   Psychiatric/Behavioral: Negative.     Blood pressure 111/75, pulse 71, temperature 98 F (36.7 C), temperature source Oral, resp. rate 18, height 5\' 11"  (1.803 m), weight 88.5 kg, SpO2 99 %.Body mass index is 27.2 kg/m.  General Appearance: Casual  Eye Contact:  Fair  Speech:  Clear and Coherent  Volume:  Normal  Mood:  Euthymic  Affect:  Congruent  Thought Process:  Goal Directed  Orientation:  Full (Time, Place, and Person)  Thought Content:  WDL and Logical  Suicidal Thoughts:  No  Homicidal Thoughts:  No  Memory:  Immediate;   Fair Recent;   Fair Remote;   Fair  Judgement:  Fair  Insight:  Fair  Psychomotor Activity:  Normal  Concentration:  Concentration: Fair  Recall:  [employer] of Knowledge:  Fair  Language:  Fair  Akathisia:  No  Handed:  Right  AIMS (if indicated):     Assets:  Desire for Improvement Physical Health Resilience Social Support  ADL's:  Intact  Cognition:  WNL  Sleep:  Number of Hours: 5.3     Treatment Plan Summary: Daily contact with patient to assess and evaluate symptoms and progress in treatment, Medication management and Plan Patient is doing well.  No acute symptoms.  No real medical need for further hospitalization but the patient is under commitment and we have no group home willing to take him.  Supportive counseling and reassurance.  Treatment team still working on discharge planning  Mordecai Rasmussen, MD 12/15/2018, 4:34 PM

## 2018-12-15 NOTE — BHH Group Notes (Signed)
LCSW Group Therapy Note  12/15/2018 1:00 PM  Type of Therapy/Topic:  Group Therapy:  Emotion Regulation  Participation Level:  Minimal   Description of Group:   The purpose of this group is to assist patients in learning to regulate negative emotions and experience positive emotions. Patients will be guided to discuss ways in which they have been vulnerable to their negative emotions. These vulnerabilities will be juxtaposed with experiences of positive emotions or situations, and patients will be challenged to use positive emotions to combat negative ones. Special emphasis will be placed on coping with negative emotions in conflict situations, and patients will process healthy conflict resolution skills.  Therapeutic Goals: 1. Patient will identify two positive emotions or experiences to reflect on in order to balance out negative emotions 2. Patient will label two or more emotions that they find the most difficult to experience 3. Patient will demonstrate positive conflict resolution skills through discussion and/or role plays  Summary of Patient Progress: Patient did attend group. Patient discussed how he has been vulnerable to pettiness.  Patient discussed how he tries to remain positive and rise above the situation. Patient reports that he attempts to remain very aware "of my state of mind".  Patient left group prior to discussion on coping skills.    Therapeutic Modalities:   Cognitive Behavioral Therapy Feelings Identification Dialectical Behavioral Therapy  Penni Homans, MSW, LCSW 12/15/2018 12:40 PM

## 2018-12-15 NOTE — Progress Notes (Signed)
Patient participated in groups active and appropriate and responding well to treatment regimen,patient is complying with his medications, with out any side effects, engaging appropriate with staff and a peers , no out burst or violent behaviors , denies any SI/HI AVH and no signs of hallucinations or delusions, patient appear to be bissy righting and  Reading noted, mood and affect is appropriate , coping well in the unit and sleep is intermittent only requiring 15 minutes safety checks for safety no distress. 

## 2018-12-15 NOTE — Progress Notes (Signed)
Recreation Therapy Notes  Date: 12/15/2018  Time: 9:30 am   Location: Craft room   Behavioral response: N/A   Intervention Topic: Coping skills  Discussion/Intervention: Patient did not attend group.   Clinical Observations/Feedback:  Patient did not attend group.   Wannetta Langland LRT/CTRS        Verania Salberg 12/15/2018 11:37 AM

## 2018-12-16 NOTE — Plan of Care (Signed)
Patient is alert and oriented X 3, patient denies SI, HI and AVH. Patient is awake more during the day since medications have changed to night. Patient interacts well with peers on the unit and staff. Patient more animated around peers on the unit, talking, laughing and joking with staff and peers. Patient denies having pain today 0/10. Safety checks to continue Q 15 minutes. Problem: Education: Goal: Knowledge of [facility] Education information/materials will improve Outcome: Progressing   Problem: Coping: Goal: Ability to verbalize frustrations and anger appropriately will improve Outcome: Progressing Goal: Ability to demonstrate self-control will improve Outcome: Progressing   Problem: Education: Goal: Will be free of psychotic symptoms Outcome: Progressing Goal: Knowledge of the prescribed therapeutic regimen will improve Outcome: Progressing   Problem: Education: Goal: Knowledge of the prescribed therapeutic regimen will improve Outcome: Progressing

## 2018-12-16 NOTE — BHH Counselor (Signed)
CSW called to follow up on the following:  Jackson [name] No answer CSW left HIPAA compliant voicemail.  [employer] [facility], [phone] Still reviewing the paperwork at this time. Will call tomorrow to follow up.  [facility], [phone] CSW spoke with Rivka Barbara who initially reported that they were accepting patients, however at this time, they are no longer accepting new residents.  [facility], [phone] No answer CSW left HIPAA compliant voicemail.  Penni Homans, MSW, LCSW 12/16/2018 2:17 PM

## 2018-12-16 NOTE — BHH Counselor (Signed)
CSW called the following:  [facility], [phone] Voicemail box was full.  [address] [facility] [place], [phone] Will call back after checking the other homes.  [facility], [phone] Number not in service.  [facility], [phone] Left HIPAA compliant voicemail.  [facility], [phone] CSW spoke with Birder Robson who continued to have questions on medications.  CSW explained PRN medication to her and informed that CSW provided Dr. Toni Amend with Jackson [name]'s phone number and he called though he was unable to speak with someone.  Carney Bern then asked about a day program and CSW reported that pt will be involved in an ACT team who may have a PSR program for the patient or schedule around a day program if necessary.  Carney Bern reports that she will call back after calling [employer].  She reports "we will entertain the thought if he attends a day program".     [facility]  [phone]   CSW was asked to call [phone],[name] the owner.  CSW was informed that they are full at the moment.   [facility]   [phone] and [facility]  [phone]  CSW was informed no male beds are available.   [facility]    [phone]    CSW was informed no male beds are available.   [facility]   [phone]  Left HIPAA compliant voicemail.  [facility]   [phone] Left HIPAA compliant voicemail.  [facility]   [phone]  Left HIPAA compliant voicemail.  [facility], [phone] Phone rang with no option to leave a voicemail.  [address], [employer], [phone] Not a working number.   [facility], [phone] CSW was informed no male beds at this time, only male beds.   Penni Homans, MSW, LCSW 12/16/2018 2:58 PM

## 2018-12-16 NOTE — Progress Notes (Signed)
D - Patient was in his room upon arrival to the unit. Patient was pleasant during assessment and medication administration. Patient denies SI/HI/AVH, pain, anxiety and depression with this Clinical research associate. This was the first evening that the patient presented without active psychosis. The patient in the past has tried to show this Clinical research associate orbs coming out of his fingers that he can control.   A - Patient was compliant with medication administration per MD orders and procedures on the unit. Patient given education. Patient given support and encouragement to be active in his treatment plan. Patient informed to let staff know if there are any issues or problems on the unit.   R - Patient being monitored Q 15 minutes for safety per unit protocol. Patient remains safe on the unit.

## 2018-12-16 NOTE — BHH Counselor (Signed)
CSW spoke with the patients mother and updated mother on the patients progress on finding group home.  CSW informed mother that the patient has been only tentatively approved by 2 group homes at this time and final approval has not been provided.    CSW also provided mother with update that pt has been removed [facility].  Penni Homans, MSW, LCSW 12/16/2018 3:31 PM

## 2018-12-16 NOTE — Plan of Care (Signed)
This evening was the best this writer has seen the patient since he has been here. Patient didn't try to show me anything that "wasn't there" such as orbs coming out of the patients fingers.  Problem: Education: Goal: Will be free of psychotic symptoms Outcome: Progressing

## 2018-12-16 NOTE — [facility] Group Notes (Signed)
Balance In Life 12/16/2018 1PM  Type of Therapy/Topic:  Group Therapy:  Balance in Life  Participation Level:  Active  Description of Group:   This group will address the concept of balance and how it feels and looks when one is unbalanced. Patients will be encouraged to process areas in their lives that are out of balance and identify reasons for remaining unbalanced. Facilitators will guide patients in utilizing problem-solving interventions to address and correct the stressor making their life unbalanced. Understanding and applying boundaries will be explored and addressed for obtaining and maintaining a balanced life. Patients will be encouraged to explore ways to assertively make their unbalanced needs known to significant others in their lives, using other group members and facilitator for support and feedback.  Therapeutic Goals: 1. Patient will identify two or more emotions or situations they have that consume much of in their lives. 2. Patient will identify signs/triggers that life has become out of balance:  3. Patient will identify two ways to set boundaries in order to achieve balance in their lives:  4. Patient will demonstrate ability to communicate their needs through discussion and/or role plays  Summary of Patient Progress:  Actively and appropriately engaged in the group. Patient was able to provide support and validation to other group members.Patient practiced active listening when interacting with the facilitator and other group members. Patient identified being more tolerant of others/situations as a way to find balance in life.  Therapeutic Modalities:   Cognitive Behavioral Therapy Solution-Focused Therapy Assertiveness Training  Perian Tedder Philip Aspen, LCSW

## 2018-12-16 NOTE — BHH Counselor (Signed)
CSW spoke with Vivien Rossetti at [facility] who reports that patient is "warm" on the waitlist.  CSW informed that patient is still here and in need of a bed.   Penni Homans, MSW, LCSW 12/16/2018 8:09 AM

## 2018-12-16 NOTE — Progress Notes (Signed)
Recreation Therapy Notes  Date: 12/16/2018  Time: 9:30 am   Location: Craft room   Behavioral response: N/A   Intervention Topic: Emotions  Discussion/Intervention: Patient did not attend group.   Clinical Observations/Feedback:  Patient did not attend group.   Scotland Korver LRT/CTRS        Mele Sylvester 12/16/2018 12:04 PM

## 2018-12-16 NOTE — Progress Notes (Signed)
[facility] MD Progress Note  12/16/2018 3:32 PM Jackson [name]  MRN:  [id number] Subjective: Follow-up for this patient with schizoaffective disorder.  No new complaints.  Functioning well.  No signs of overt bizarre behavior or dangerousness.  No new physical complaints.  We learned today that [facility] has [facility]. Principal Problem: Schizoaffective disorder, bipolar type (HCC) Diagnosis: Principal Problem:   Schizoaffective disorder, bipolar type (HCC) Active Problems:   Noncompliance  Total Time spent with patient: 20 minutes  Past Psychiatric History: Long history of bipolar disorder.  Past Medical History:  Past Medical History:  Diagnosis Date  . Bipolar 1 disorder (HCC)   . Schizoaffective disorder (HCC)    History reviewed. No pertinent surgical history. Family History: History reviewed. No pertinent family history. Family Psychiatric  History: See previous Social History:  Social History   Substance and Sexual Activity  Alcohol Use Not on file     Social History   Substance and Sexual Activity  Drug Use Not on file    Social History   Socioeconomic History  . Marital status: Single    Spouse name: Not on file  . Number of children: Not on file  . Years of education: Not on file  . Highest education level: Not on file  Occupational History  . Not on file  Social Needs  . Financial resource strain: Not on file  . Food insecurity:    Worry: Not on file    Inability: Not on file  . Transportation needs:    Medical: Not on file    Non-medical: Not on file  Tobacco Use  . Smoking status: Light Tobacco Smoker    Packs/day: 0.25    Types: Cigarettes  . Smokeless tobacco: Never Used  Substance and Sexual Activity  . Alcohol use: Not on file  . Drug use: Not on file  . Sexual activity: Not on file  Lifestyle  . Physical activity:    Days per week: Not on file    Minutes per session: Not on  file  . Stress: Not on file  Relationships  . Social connections:    Talks on phone: Not on file    Gets together: Not on file    Attends religious service: Not on file    Active member of club or organization: Not on file    Attends meetings of clubs or organizations: Not on file    Relationship status: Not on file  Other Topics Concern  . Not on file  Social History Narrative  . Not on file   Additional Social History:                         Sleep: Fair  Appetite:  Fair  Current Medications: Current Facility-Administered Medications  Medication Dose Route Frequency Provider Last Rate Last Dose  . acetaminophen (TYLENOL) tablet 650 mg  650 mg Oral Q6H PRN [name], MD   650 mg at 10/26/18 2302  . alum & mag hydroxide-simeth (MAALOX/MYLANTA) 200-200-20 MG/5ML suspension 30 mL  30 mL Oral Q4H PRN [name], MD   30 mL at 10/26/18 2129  . ARIPiprazole (ABILIFY) tablet 15 mg  15 mg Oral QHS Charm Rings, NP   15 mg at 12/15/18 2240  . ARIPiprazole ER (ABILIFY MAINTENA) injection 400 mg  400 mg Intramuscular Q28 days Charm Rings, NP   400 mg  at 12/08/18 2217  . divalproex (DEPAKOTE ER) 24 hr tablet 1,000 mg  1,000 mg Oral QHS Klark Vanderhoef T, MD   1,000 mg at 12/15/18 2240  . hydrOXYzine (ATARAX/VISTARIL) tablet 50 mg  50 mg Oral Q4H PRN [name], MD   50 mg at 10/28/18 1610  . Influenza vac split quadrivalent PF (FLUARIX) injection 0.5 mL  0.5 mL Intramuscular Once Charm Rings, NP      . magnesium hydroxide (MILK OF MAGNESIA) suspension 30 mL  30 mL Oral Daily PRN [name], MD      . OLANZapine [facility] injection 10 mg  10 mg Intramuscular Q6H PRN Criag Wicklund T, MD      . OLANZapine (ZYPREXA) tablet 30 mg  30 mg Oral QHS Charm Rings, NP   30 mg at 12/15/18 2240  . traZODone (DESYREL) tablet 100 mg  100 mg Oral QHS PRN [name], MD   100 mg at 11/26/18 2123    Lab Results: No results found for this or  any previous visit (from the past 48 hour(s)).  Blood Alcohol level:  Lab Results  Component Value Date   ETH <10 10/16/2018    Metabolic Disorder Labs: Lab Results  Component Value Date   HGBA1C 5.2 10/23/2018   MPG 102.54 10/23/2018   No results found for: PROLACTIN Lab Results  Component Value Date   CHOL 129 10/23/2018   TRIG 58 10/23/2018   HDL 50 10/23/2018   CHOLHDL 2.6 10/23/2018   VLDL 12 10/23/2018   LDLCALC 67 10/23/2018    Physical Findings: AIMS: Facial and Oral Movements Muscles of Facial Expression: None, normal Lips and Perioral Area: None, normal Jaw: None, normal Tongue: None, normal,Extremity Movements Upper (arms, wrists, hands, fingers): None, normal Lower (legs, knees, ankles, toes): None, normal, Trunk Movements Neck, shoulders, hips: None, normal, Overall Severity Severity of abnormal movements (highest score from questions above): None, normal Incapacitation due to abnormal movements: None, normal Patient's awareness of abnormal movements (rate only patient's report): No Awareness, Dental Status Current problems with teeth and/or dentures?: No Does patient usually wear dentures?: No  CIWA:  CIWA-Ar Total: 0 COWS:  COWS Total Score: 0  Musculoskeletal: Strength & Muscle Tone: within normal limits Gait & Station: normal Patient leans: N/A  Psychiatric Specialty Exam: Physical Exam  Nursing note and vitals reviewed. Constitutional: He appears well-developed and well-nourished.  HENT:  Head: Normocephalic and atraumatic.  Eyes: Pupils are equal, round, and reactive to light. Conjunctivae are normal.  Neck: Normal range of motion.  Cardiovascular: Regular rhythm and normal heart sounds.  Respiratory: Effort normal. No respiratory distress.  GI: Soft.  Musculoskeletal: Normal range of motion.  Neurological: He is alert.  Skin: Skin is warm and dry.  Psychiatric: He has a normal mood and affect. His speech is normal and behavior is  normal. Judgment and thought content normal. Cognition and memory are normal.    Review of Systems  Constitutional: Negative.   HENT: Negative.   Eyes: Negative.   Respiratory: Negative.   Cardiovascular: Negative.   Gastrointestinal: Negative.   Musculoskeletal: Negative.   Skin: Negative.   Neurological: Negative.   Psychiatric/Behavioral: Negative.     Blood pressure (!) 98/49, pulse 77, temperature 98.1 F (36.7 C), temperature source Oral, resp. rate 15, height  (1.803 m), weight 88.5 kg, SpO2 98 %.Body mass index is 27.2 kg/m.  General Appearance: Casual  Eye Contact:  Good  Speech:  Clear and Coherent  Volume:  Normal  Mood:  Euthymic  Affect:  Congruent  Thought Process:  Goal Directed  Orientation:  Full (Time, Place, and Person)  Thought Content:  Logical  Suicidal Thoughts:  No  Homicidal Thoughts:  No  Memory:  Immediate;   Fair Recent;   Fair Remote;   Fair  Judgement:  Fair  Insight:  Fair  Psychomotor Activity:  Normal  Concentration:  Concentration: Fair  Recall:  [employer]  Fund of Knowledge:  Fair  Language:  Fair  Akathisia:  No  Handed:  Right  AIMS (if indicated):     Assets:  Desire for Improvement Financial Resources/Insurance Social Support  ADL's:  Intact  Cognition:  WNL  Sleep:  Number of Hours: 5.15     Treatment Plan Summary: Daily contact with patient to assess and evaluate symptoms and progress in treatment, Medication management and Plan Patient really does seem to be pretty stable.  Taking his medicine regularly.  No new complaints.  Behavior calm.  At this point we are just trying to find a group home that is willing to take him as we are otherwise ready for discharge.  Jackson RasmussenJohn Viviann Broyles, MD 12/16/2018, 3:32 PM

## 2018-12-17 NOTE — Plan of Care (Signed)
Patient was trying to show this writer how he can manifest an orb to come out of his fingers this evening.   Problem: Education: Goal: Will be free of psychotic symptoms Outcome: Not Progressing

## 2018-12-17 NOTE — BHH Counselor (Signed)
CSW contacted the following:  [facility], [phone] Left HIPAA compliant voicemail.  [facility], [phone] No answer CSW left HIPAA compliant voicemail.  [address] [facility] [place], [phone] No male beds at this time.   [facility], [phone] AND [facility], [phone]   CSW spoke with [place] who reports that he is awaiting a call back from the group home owner.   CSW notes that she was informed that [facility] at [phone] has male bed openings.  CSW has faxed release to mother and received confirmation of receipt.  CSW has not received return release with mother's signature to pursue this possible placement further.  Penni Homans, MSW, LCSW 12/17/2018 1:54 PM

## 2018-12-17 NOTE — Progress Notes (Signed)
[facility] MD Progress Note  12/17/2018 4:08 PM Jackson [name]  MRN:  [id number] Subjective: Follow-up note for this gentleman with schizoaffective disorder.  No new complaints.  Neatly dressed participating appropriately no complaints of any psychotic symptoms.  No depression no suicidal or homicidal ideation.  No physical complaints Principal Problem: Schizoaffective disorder, bipolar type (HCC) Diagnosis: Principal Problem:   Schizoaffective disorder, bipolar type (HCC) Active Problems:   Noncompliance  Total Time spent with patient: 20 minutes  Past Psychiatric History: Long history of schizoaffective disorder with long hospitalizations in the past  Past Medical History:  Past Medical History:  Diagnosis Date  . Bipolar 1 disorder (HCC)   . Schizoaffective disorder (HCC)    History reviewed. No pertinent surgical history. Family History: History reviewed. No pertinent family history. Family Psychiatric  History: See previous Social History:  Social History   Substance and Sexual Activity  Alcohol Use Not on file     Social History   Substance and Sexual Activity  Drug Use Not on file    Social History   Socioeconomic History  . Marital status: Single    Spouse name: Not on file  . Number of children: Not on file  . Years of education: Not on file  . Highest education level: Not on file  Occupational History  . Not on file  Social Needs  . Financial resource strain: Not on file  . Food insecurity:    Worry: Not on file    Inability: Not on file  . Transportation needs:    Medical: Not on file    Non-medical: Not on file  Tobacco Use  . Smoking status: Light Tobacco Smoker    Packs/day: 0.25    Types: Cigarettes  . Smokeless tobacco: Never Used  Substance and Sexual Activity  . Alcohol use: Not on file  . Drug use: Not on file  . Sexual activity: Not on file  Lifestyle  . Physical activity:    Days per week: Not on file    Minutes per session: Not on file   . Stress: Not on file  Relationships  . Social connections:    Talks on phone: Not on file    Gets together: Not on file    Attends religious service: Not on file    Active member of club or organization: Not on file    Attends meetings of clubs or organizations: Not on file    Relationship status: Not on file  Other Topics Concern  . Not on file  Social History Narrative  . Not on file   Additional Social History:                         Sleep: Fair  Appetite:  Fair  Current Medications: Current Facility-Administered Medications  Medication Dose Route Frequency Provider Last Rate Last Dose  . acetaminophen (TYLENOL) tablet 650 mg  650 mg Oral Q6H PRN Terance HartStephens, [name], MD   650 mg at 10/26/18 2302  . alum & mag hydroxide-simeth (MAALOX/MYLANTA) 200-200-20 MG/5ML suspension 30 mL  30 mL Oral Q4H PRN Terance HartStephens, [name], MD   30 mL at 10/26/18 2129  . ARIPiprazole (ABILIFY) tablet 15 mg  15 mg Oral QHS Charm RingsLord, Jamison Y, NP   15 mg at 12/16/18 2307  . ARIPiprazole ER (ABILIFY MAINTENA) injection 400 mg  400 mg Intramuscular Q28 days Charm RingsLord, Jamison Y, NP   400 mg at 12/08/18 2217  . divalproex (DEPAKOTE ER)  24 hr tablet 1,000 mg  1,000 mg Oral QHS Anderson Coppock T, MD   1,000 mg at 12/16/18 2308  . hydrOXYzine (ATARAX/VISTARIL) tablet 50 mg  50 mg Oral Q4H PRN [name], MD   50 mg at 10/28/18 2409  . Influenza vac split quadrivalent PF (FLUARIX) injection 0.5 mL  0.5 mL Intramuscular Once Charm Rings, NP      . magnesium hydroxide (MILK OF MAGNESIA) suspension 30 mL  30 mL Oral Daily PRN [name], MD      . OLANZapine [facility]) injection 10 mg  10 mg Intramuscular Q6H PRN Melvie Paglia T, MD      . OLANZapine (ZYPREXA) tablet 30 mg  30 mg Oral QHS Charm Rings, NP   30 mg at 12/16/18 2307  . traZODone (DESYREL) tablet 100 mg  100 mg Oral QHS PRN [name], MD   100 mg at 11/26/18 2123    Lab Results: No results found for this or any  previous visit (from the past 48 hour(s)).  Blood Alcohol level:  Lab Results  Component Value Date   ETH <10 10/16/2018    Metabolic Disorder Labs: Lab Results  Component Value Date   HGBA1C 5.2 10/23/2018   MPG 102.54 10/23/2018   No results found for: PROLACTIN Lab Results  Component Value Date   CHOL 129 10/23/2018   TRIG 58 10/23/2018   HDL 50 10/23/2018   CHOLHDL 2.6 10/23/2018   VLDL 12 10/23/2018   LDLCALC 67 10/23/2018    Physical Findings: AIMS: Facial and Oral Movements Muscles of Facial Expression: None, normal Lips and Perioral Area: None, normal Jaw: None, normal Tongue: None, normal,Extremity Movements Upper (arms, wrists, hands, fingers): None, normal Lower (legs, knees, ankles, toes): None, normal, Trunk Movements Neck, shoulders, hips: None, normal, Overall Severity Severity of abnormal movements (highest score from questions above): None, normal Incapacitation due to abnormal movements: None, normal Patient's awareness of abnormal movements (rate only patient's report): No Awareness, Dental Status Current problems with teeth and/or dentures?: No Does patient usually wear dentures?: No  CIWA:  CIWA-Ar Total: 0 COWS:  COWS Total Score: 0  Musculoskeletal: Strength & Muscle Tone: within normal limits Gait & Station: normal Patient leans: N/A  Psychiatric Specialty Exam: Physical Exam  Nursing note and vitals reviewed. Constitutional: He appears well-developed and well-nourished.  HENT:  Head: Normocephalic and atraumatic.  Eyes: Pupils are equal, round, and reactive to light. Conjunctivae are normal.  Neck: Normal range of motion.  Cardiovascular: Regular rhythm and normal heart sounds.  Respiratory: Effort normal.  GI: Soft.  Musculoskeletal: Normal range of motion.  Neurological: He is alert.  Skin: Skin is warm and dry.  Psychiatric: He has a normal mood and affect. His behavior is normal. Judgment and thought content normal.    Review  of Systems  Constitutional: Negative.   HENT: Negative.   Eyes: Negative.   Respiratory: Negative.   Cardiovascular: Negative.   Gastrointestinal: Negative.   Musculoskeletal: Negative.   Skin: Negative.   Neurological: Negative.   Psychiatric/Behavioral: Negative.     Blood pressure 114/74, pulse 68, temperature 97.8 F (36.6 C), temperature source Oral, resp. rate 17, height 5\' 11"  (1.803 m), weight 88.5 kg, SpO2 99 %.Body mass index is 27.2 kg/m.  General Appearance: Casual  Eye Contact:  Good  Speech:  Clear and Coherent  Volume:  Normal  Mood:  Euthymic  Affect:  Congruent  Thought Process:  Goal Directed  Orientation:  Full (  Time, Place, and Person)  Thought Content:  Logical  Suicidal Thoughts:  No  Homicidal Thoughts:  No  Memory:  Immediate;   Fair Recent;   Fair Remote;   Fair  Judgement:  Fair  Insight:  Fair  Psychomotor Activity:  Normal  Concentration:  Concentration: Fair  Recall:  [employer] of Knowledge:  Fair  Language:  Fair  Akathisia:  No  Handed:  Right  AIMS (if indicated):     Assets:  Desire for Improvement Physical Health Resilience Social Support  ADL's:  Intact  Cognition:  WNL  Sleep:  Number of Hours: 4.15     Treatment Plan Summary: Daily contact with patient to assess and evaluate symptoms and progress in treatment, Medication management and Plan Mr. Colford continues to do well and has been stable for a while.  We are really just waiting on finding some kind of placement none of which has been available.  He may run out of commitment in another few weeks and at this point would not meet criteria for recommitment.  No indication for changing any medicine at this point  Mordecai Rasmussen, MD 12/17/2018, 4:08 PM

## 2018-12-17 NOTE — Tx Team (Signed)
Interdisciplinary Treatment and Diagnostic Plan Update  12/17/2018 Time of Session: 8:30AM  Mankirat Killman MRN: [id number]  Principal Diagnosis: Schizoaffective disorder, bipolar type [facility])  Secondary Diagnoses: Principal Problem:   Schizoaffective disorder, bipolar type (HCC) Active Problems:   Noncompliance   Current Medications:  Current Facility-Administered Medications  Medication Dose Route Frequency Provider Last Rate Last Dose  . acetaminophen (TYLENOL) tablet 650 mg  650 mg Oral Q6H PRN [name], MD   650 mg at 10/26/18 2302  . alum & mag hydroxide-simeth (MAALOX/MYLANTA) 200-200-20 MG/5ML suspension 30 mL  30 mL Oral Q4H PRN [name], MD   30 mL at 10/26/18 2129  . ARIPiprazole (ABILIFY) tablet 15 mg  15 mg Oral QHS Charm Rings, NP   15 mg at 12/16/18 2307  . ARIPiprazole ER (ABILIFY MAINTENA) injection 400 mg  400 mg Intramuscular Q28 days Charm Rings, NP   400 mg at 12/08/18 2217  . divalproex (DEPAKOTE ER) 24 hr tablet 1,000 mg  1,000 mg Oral QHS [name], [name], MD   1,000 mg at 12/16/18 2308  . hydrOXYzine (ATARAX/VISTARIL) tablet 50 mg  50 mg Oral Q4H PRN [name], MD   50 mg at 10/28/18 7681  . Influenza vac split quadrivalent PF (FLUARIX) injection 0.5 mL  0.5 mL Intramuscular Once Charm Rings, NP      . magnesium hydroxide (MILK OF MAGNESIA) suspension 30 mL  30 mL Oral Daily PRN [name], MD      . OLANZapine [facility]) injection 10 mg  10 mg Intramuscular Q6H PRN [name], [name], MD      . OLANZapine (ZYPREXA) tablet 30 mg  30 mg Oral QHS Charm Rings, NP   30 mg at 12/16/18 2307  . traZODone (DESYREL) tablet 100 mg  100 mg Oral QHS PRN [name], MD   100 mg at 11/26/18 2123   PTA Medications: No medications prior to admission.    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   Physician Treatment Plan for Primary Diagnosis: Schizoaffective disorder, bipolar type (HCC) Long Term Goal(s): Improvement in symptoms so as ready for discharge Improvement in symptoms so as ready for discharge   Short Term Goals: Ability to demonstrate self-control will improve Ability to identify and develop effective coping behaviors will improve Compliance with prescribed medications will improve  Medication Management: Evaluate patient's response, side effects, and tolerance of medication regimen.  Therapeutic Interventions: 1 to 1 [place], Unit Group [place] and Medication administration.  Evaluation of Outcomes: Progressing  Physician Treatment Plan for Secondary Diagnosis: Principal Problem:   Schizoaffective disorder, bipolar type (HCC) Active Problems:   Noncompliance  Long Term Goal(s): Improvement in symptoms so as ready for discharge Improvement in symptoms so as ready for discharge   Short Term Goals: Ability to demonstrate self-control will improve Ability to identify and develop effective coping behaviors will improve Compliance with prescribed medications will improve     Medication Management: Evaluate patient's response, side effects, and tolerance of medication regimen.  Therapeutic Interventions: 1 to 1 [place], Unit Group [place] and Medication administration.  Evaluation of Outcomes: Progressing   RN Treatment Plan for Primary Diagnosis: Schizoaffective disorder, bipolar type (HCC) Long Term Goal(s): Knowledge of disease and therapeutic regimen to maintain health will improve  Short Term Goals:  Ability to verbalize frustration and anger appropriately will improve, Ability to demonstrate self-control, Ability to participate in decision making will improve and Ability to verbalize  feelings will improve  Medication Management: RN will administer medications as ordered by provider, will assess and evaluate patient's response and provide education to patient for prescribed medication. RN will report any adverse and/or side effects to prescribing provider.  Therapeutic Interventions: 1 on 1 counseling [place], Psychoeducation, Medication administration, Evaluate responses to treatment, Monitor vital signs and CBGs as ordered, Perform/monitor CIWA, COWS, AIMS and Fall Risk screenings as ordered, Perform wound care treatments as ordered.  Evaluation of Outcomes: Progressing   LCSW Treatment Plan for Primary Diagnosis: Schizoaffective disorder, bipolar type (HCC) Long Term Goal(s): Safe transition to appropriate next level of care at discharge, Engage patient in therapeutic group addressing interpersonal concerns.  Short Term Goals: Engage patient in aftercare planning with referrals and resources, Increase social support, Increase ability to appropriately verbalize feelings, Increase emotional regulation and Facilitate acceptance of mental health diagnosis and concerns  Therapeutic Interventions: Assess for all discharge needs, 1 to 1 time with Social worker, Explore available resources and support systems, Assess for adequacy in community support network, Educate family and significant other(s) on suicide prevention, Complete Psychosocial Assessment, Interpersonal group therapy.  Evaluation of Outcomes: Progressing   Progress in Treatment: Attending groups: Yes. Participating in groups: Yes. Taking medication as prescribed: Yes.   Toleration medication: Yes. Family/Significant other contact made: Yes, individual(s) contacted:  CSW completed SPE with the patients mother.  Patient understands diagnosis: Yes. Discussing patient identified problems/goals with staff: Yes. Medical problems stabilized or resolved: Yes. Denies suicidal/homicidal ideation: Yes. Issues/concerns  per patient self-inventory: No. Other: none  New problem(s) identified: No, Describe:  none  New Short Term/Long Term Goal(s): elimination of symptoms of psychosis, medication management for mood stabilization; elimination of SI thoughts; development of comprehensive mental wellness plan.  Patient Goals:  "independence"  Discharge Plan or Barriers: Pt's mother is his legal guardian.  Mother has agreed to referrals to group homes following a staffing with Nanine MeansJamison Lord, NP and Dr. Lucianne MussKumar.  CSW [place], [place].  Patient has been removed from [facility] due stability.  Though quite a few group homes have been contacted, only three are possibilities at this time.       Reason for Continuation of Hospitalization: Aggression Delusions  Hallucinations Mania Medication stabilization  Estimated Length of Stay: TBD  Recreational Therapy: Patient Stressors: N/A Patient Goal: Patient will focus on task/topic with 2 prompts from staff within 5 recreation therapy group Sing  Attendees: Patient:  12/17/2018 1:11 PM  Physician: Dr. Toni Amendlapacs, MD 12/17/2018 1:11 PM  Nursing:  12/17/2018 1:11 PM  RN Care Manager: 12/17/2018 1:11 PM  Social Worker: Penni HomansMichaela Kayona Foor, LCSW 12/17/2018 1:11 PM  Recreational Therapist:  12/17/2018 1:11 PM  Other:  12/17/2018 1:11 PM  Other:  12/17/2018 1:11 PM  Other: 12/17/2018 1:11 PM    Scribe for Treatment Team: Harden MoMichaela J Cleda Imel, LCSW 12/17/2018 1:11 PM

## 2018-12-17 NOTE — [facility] Group Notes (Signed)
LCSW Group Therapy Note  12/17/2018 12:23 PM  Type of Therapy and Topic:  Group Therapy:  Feelings around Relapse and Recovery  Participation Level:  Did Not Attend   Description of Group:    Patients in this group will discuss emotions they experience before and after a relapse. They will process how experiencing these feelings, or avoidance of experiencing them, relates to having a relapse. Facilitator will guide patients to explore emotions they have related to recovery. Patients will be encouraged to process which emotions are more powerful. They will be guided to discuss the emotional reaction significant others in their lives may have to their relapse or recovery. Patients will be assisted in exploring ways to respond to the emotions of others without this contributing to a relapse.  Therapeutic Goals: 1. Patient will identify two or more emotions that lead to a relapse for them 2. Patient will identify two emotions that result when they relapse 3. Patient will identify two emotions related to recovery 4. Patient will demonstrate ability to communicate their needs through discussion and/or role plays   Summary of Patient Progress: x    Therapeutic Modalities:   Cognitive Behavioral Therapy Solution-Focused Therapy Assertiveness Training Relapse Prevention Therapy   Sheniah Supak, MSW, LCSW Clinical Social Work 12/17/2018 12:23 PM   

## 2018-12-17 NOTE — Progress Notes (Signed)
D - Patient was in his room upon arrival to the unit. Patient was pleasant during assessment and medication administration. Patient denies SI/HI/AVH, pain, anxiety and depression with this Clinical research associate.Patient tried to summons an orb to come out of his fingers to show this writer this evening.   A - Patient was compliant with medication administration per MD orders and procedures on the unit. Patient given education. Patient given support and encouragement to be active in his treatment plan. Patient informed to let staff know if there are any issues or problems on the unit.   R - Patient being monitored Q 15 minutes for safety per unit protocol. Patient remains safe on the unit.

## 2018-12-17 NOTE — Progress Notes (Signed)
Recreation Therapy Notes  Date:12/17/2018  Time:9:30 am  Location:Craft room  Behavioral response:N/A  Intervention Topic: Team work  Discussion/Intervention: Patient did not attend group.  Clinical Observations/Feedback:  Patient did not attend group.  Asaiah Hunnicutt LRT/CTRS        Jonquil Stubbe 12/17/2018 10:33 AM

## 2018-12-17 NOTE — Plan of Care (Signed)
Patient is appropriate in the unit.No issues verbalized.Appetite and energy level good.Support and encouragement given.

## 2018-12-18 NOTE — Progress Notes (Addendum)
Patient is appropriately adjusted in the unit , and maintaining safety, no aggression or outburst behaviors , patient has adequate coping skills, denies any suicidal, homicidal ideations and patient denies any depression or anxiety and no delusions or hallucinations. Patient is compliant with his medications and no side effects reported, patient actively involve in social scheduled activities with peers, with out any issues , patient voice no physical complains and sleep is intermittent and steady and only requiring 15 minutes safety checks no distress.

## 2018-12-18 NOTE — Plan of Care (Signed)
Patient is appropriate in the unit.No issues verbalized.

## 2018-12-18 NOTE — Progress Notes (Signed)
[facility] MD Progress Note  12/18/2018 1:14 PM Jackson [name]  MRN:  [id number] Subjective: Follow-up for this gentleman with schizoaffective disorder.  Jackson [name] has been here since 10/17/18. Safe disposition is challenging.   Pt seen and chart reviewed.  Jackson [name] remains in his room, calm and cooperative, and has no complaints.  Jackson [name] is patiently waiting for discharge plan.   Principal Problem: Schizoaffective disorder, bipolar type (HCC) Diagnosis: Principal Problem:   Schizoaffective disorder, bipolar type (HCC) Active Problems:   Noncompliance  Total Time spent with patient: 15 minutes  Past Psychiatric History: Patient has a history of bipolar disorder or schizoaffective disorder and has required lengthy hospitalization in the past  Past Medical History:  Past Medical History:  Diagnosis Date  . Bipolar 1 disorder (HCC)   . Schizoaffective disorder (HCC)    History reviewed. No pertinent surgical history. Family History: History reviewed. No pertinent family history. Family Psychiatric  History: None known Social History:  Social History   Substance and Sexual Activity  Alcohol Use Not on file     Social History   Substance and Sexual Activity  Drug Use Not on file    Social History   Socioeconomic History  . Marital status: Single    Spouse name: Not on file  . Number of children: Not on file  . Years of education: Not on file  . Highest education level: Not on file  Occupational History  . Not on file  Social Needs  . Financial resource strain: Not on file  . Food insecurity:    Worry: Not on file    Inability: Not on file  . Transportation needs:    Medical: Not on file    Non-medical: Not on file  Tobacco Use  . Smoking status: Light Tobacco Smoker    Packs/day: 0.25    Types: Cigarettes  . Smokeless tobacco: Never Used  Substance and Sexual Activity  . Alcohol use: Not on file  . Drug use: Not on file  . Sexual activity: Not on file  Lifestyle  . Physical  activity:    Days per week: Not on file    Minutes per session: Not on file  . Stress: Not on file  Relationships  . Social connections:    Talks on phone: Not on file    Gets together: Not on file    Attends religious service: Not on file    Active member of club or organization: Not on file    Attends meetings of clubs or organizations: Not on file    Relationship status: Not on file  Other Topics Concern  . Not on file  Social History Narrative  . Not on file   Additional Social History:   Sleep: Fair  Appetite:  Fair  Current Medications: Current Facility-Administered Medications  Medication Dose Route Frequency Provider Last Rate Last Dose  . acetaminophen (TYLENOL) tablet 650 mg  650 mg Oral Q6H PRN [name], MD   650 mg at 10/26/18 2302  . alum & mag hydroxide-simeth (MAALOX/MYLANTA) 200-200-20 MG/5ML suspension 30 mL  30 mL Oral Q4H PRN [name], MD   30 mL at 10/26/18 2129  . ARIPiprazole (ABILIFY) tablet 15 mg  15 mg Oral QHS Charm Rings, NP   15 mg at 12/17/18 2141  . ARIPiprazole ER (ABILIFY MAINTENA) injection 400 mg  400 mg Intramuscular Q28 days Charm Rings, NP   400 mg at 12/08/18 2217  . divalproex (DEPAKOTE ER) 24 hr  tablet 1,000 mg  1,000 mg Oral QHS [name], [name], MD   1,000 mg at 12/17/18 2141  . hydrOXYzine (ATARAX/VISTARIL) tablet 50 mg  50 mg Oral Q4H PRN Terance HartStephens, [name], MD   50 mg at 10/28/18 [id number]  . Influenza vac split quadrivalent PF (FLUARIX) injection 0.5 mL  0.5 mL Intramuscular Once Charm RingsLord, Jamison Y, NP      . magnesium hydroxide (MILK OF MAGNESIA) suspension 30 mL  30 mL Oral Daily PRN Terance HartStephens, [name], MD      . OLANZapine [facility](ZYPREXA) injection 10 mg  10 mg Intramuscular Q6H PRN [name], [name], MD      . OLANZapine (ZYPREXA) tablet 30 mg  30 mg Oral QHS Charm RingsLord, Jamison Y, NP   30 mg at 12/17/18 2141  . traZODone (DESYREL) tablet 100 mg  100 mg Oral QHS PRN Terance HartStephens, [name], MD   100 mg at 11/26/18 2123     Lab Results: No results found for this or any previous visit (from the past 48 hour(s)).  Blood Alcohol level:  Lab Results  Component Value Date   ETH <10 10/16/2018    Metabolic Disorder Labs: Lab Results  Component Value Date   HGBA1C 5.2 10/23/2018   MPG 102.54 10/23/2018   No results found for: PROLACTIN Lab Results  Component Value Date   CHOL 129 10/23/2018   TRIG 58 10/23/2018   HDL 50 10/23/2018   CHOLHDL 2.6 10/23/2018   VLDL 12 10/23/2018   LDLCALC 67 10/23/2018    Physical Findings: AIMS: Facial and Oral Movements Muscles of Facial Expression: None, normal Lips and Perioral Area: None, normal Jaw: None, normal Tongue: None, normal,Extremity Movements Upper (arms, wrists, hands, fingers): None, normal Lower (legs, knees, ankles, toes): None, normal, Trunk Movements Neck, shoulders, hips: None, normal, Overall Severity Severity of abnormal movements (highest score from questions above): None, normal Incapacitation due to abnormal movements: None, normal Patient's awareness of abnormal movements (rate only patient's report): No Awareness, Dental Status Current problems with teeth and/or dentures?: No Does patient usually wear dentures?: No  CIWA:  CIWA-Ar Total: 0 COWS:  COWS Total Score: 0  Musculoskeletal: Strength & Muscle Tone: within normal limits Gait & Station: normal Patient leans: N/A  Psychiatric Specialty Exam: Physical Exam  Nursing note and vitals reviewed. Constitutional: Jackson [name] appears well-developed and well-nourished.  HENT:  Head: Normocephalic and atraumatic.  Eyes: Pupils are equal, round, and reactive to light. Conjunctivae are normal.  Neck: Normal range of motion.  Cardiovascular: Normal heart sounds.  Respiratory: Effort normal.  GI: Soft.  Musculoskeletal: Normal range of motion.  Neurological: Jackson [name] is alert.  Skin: Skin is warm and dry.  Psychiatric: Jackson [name] has a normal mood and affect. His behavior is normal. Judgment and  thought content normal.    Review of Systems  Constitutional: Negative.   HENT: Negative.   Eyes: Negative.   Respiratory: Negative.   Cardiovascular: Negative.   Gastrointestinal: Negative.   Musculoskeletal: Negative.   Skin: Negative.   Neurological: Negative.   Psychiatric/Behavioral: Negative.     Blood pressure 95/74, pulse 76, temperature 97.6 F (36.4 C), temperature source Oral, resp. rate 16, height 5\' 11"  (1.803 m), weight 88.5 kg, SpO2 100 %.Body mass index is 27.2 kg/m.  General Appearance: Casual  Eye Contact:  Good  Speech:  Clear and Coherent  Volume:  Normal  Mood:  Euthymic  Affect:  Constricted  Thought Process:  Goal Directed  Orientation:  Full (Time, Place, and Person)  Thought Content:  Logical  Suicidal Thoughts:  No  Homicidal Thoughts:  No  Memory:  Immediate;   Fair Recent;   Fair Remote;   Fair  Judgement:  Fair  Insight:  Fair  Psychomotor Activity:  Normal  Concentration:  Concentration: Fair  Recall:  [employer] of Knowledge:  Fair  Language:  Fair  Akathisia:  No  Handed:  Right  AIMS (if indicated):     Assets:  Communication Skills Desire for Improvement Financial Resources/Insurance Physical Health Resilience Social Support  ADL's:  Intact  Cognition:  WNL  Sleep:  Number of Hours: 3.75     Treatment Plan Summary: Daily contact with patient to assess and evaluate symptoms and progress in treatment and Medication management   Plan  # Schizophrenia -- continue Abilify 15mg  daily.  -- continue Abilify Maintena 400mg  IM given on 12/08/18.  -- continue zyprexa 30mg  qhs -- continue Depakote ER 1000mg  qhs.  -- continue Trazodone 100mg  qhs prn.   # Disposition -- [facility] or [facility].  -- apply for guardianship?    Jackson Gebhard, MD 12/18/2018, 1:14 PM

## 2018-12-18 NOTE — [facility] Group Notes (Signed)
LCSW Group Therapy Note  12/18/2018 1:15pm  Type of Therapy and Topic:  Group Therapy:  Cognitive Distortions  Participation Level:  Active   Description of Group:    Patients in this group will be introduced to the topic of cognitive distortions.  Patients will identify and describe cognitive distortions, describe the feelings these distortions create for them.  Patients will identify one or more situations in their personal life where they have cognitively distorted thinking and will verbalize challenging this cognitive distortion through positive thinking skills.  Patients will practice the skill of using positive affirmations to challenge cognitive distortions using affirmation cards.    Therapeutic Goals:  1. Patient will identify two or more cognitive distortions they have used 2. Patient will identify one or more emotions that stem from use of a cognitive distortion 3. Patient will demonstrate use of a positive affirmation to counter a cognitive distortion through discussion and/or role play. 4. Patient will describe one way cognitive distortions can be detrimental to wellness   Summary of Patient Progress: The patient reported that he feels "good." Patients were introduced to the topic of cognitive distortions. The patient was able to identify and describe cognitive distortions, described the feelings these distortions create for him. Patient identified a situation in his personal life where he has cognitively distorted thinking and was able to verbalize and challenged this cognitive distortion through positive thinking skills. Patient was able to provide support and validation to other group members.     Therapeutic Modalities:   Cognitive Behavioral Therapy Motivational Interviewing   Yaris Ferrell  [name], LCSW 12/18/2018 10:19 AM

## 2018-12-18 NOTE — Progress Notes (Signed)
Patient is appropriate and maintaining safety in the unit, well rested and well feed, patient has shown some weight gain since being here, patient is taking his medications and maintaining ADLs , patient denies any SI/HI/AVH and denies any anxiety/ depression, patient expresses doing ok and only requiring 15 minutes safety rounding's no distress.

## 2018-12-19 NOTE — Progress Notes (Signed)
Seeing patient in the milieu singing from a music he said he wrote to himself , patient appear in good state of mind, responding appropriately and maintaining safety in the unit, patient is compliant denies any physical complaints , and denies any thoughts of suicidal homicidal ideations , and no signs of hallucinations or delusions, patient expresses just doing fine, patient is monitored every 15 minutes for safety no distress.

## 2018-12-19 NOTE — Progress Notes (Signed)
[name]  12/19/2018 1:39 PM Jackson [name]  MRN:  [id number] Subjective: Follow-up for this gentleman with schizoaffective disorder.  Jackson [name] has been here since 10/17/18. Safe disposition is challenging.   Pt seen and chart reviewed.  Jackson [name] has been doing well, and still waiting for placement. Jackson [name] has been here for over one month now.  But no agitation and has been cooperative with care.   Principal Problem: Schizoaffective disorder, bipolar type (HCC) Diagnosis: Principal Problem:   Schizoaffective disorder, bipolar type (HCC) Active Problems:   Noncompliance  Total Time spent with patient: 15 minutes  Past Psychiatric History: Patient has a history of bipolar disorder or schizoaffective disorder and has required lengthy hospitalization in the past  Past Medical History:  Past Medical History:  Diagnosis Date  . Bipolar 1 disorder (HCC)   . Schizoaffective disorder (HCC)    History reviewed. No pertinent surgical history. Family History: History reviewed. No pertinent family history. Family Psychiatric  History: None known Social History:  Social History   Substance and Sexual Activity  Alcohol Use Not on file     Social History   Substance and Sexual Activity  Drug Use Not on file    Social History   Socioeconomic History  . Marital status: Single    Spouse name: Not on file  . Number of children: Not on file  . Years of education: Not on file  . Highest education level: Not on file  Occupational History  . Not on file  Social Needs  . Financial resource strain: Not on file  . Food insecurity:    Worry: Not on file    Inability: Not on file  . Transportation needs:    Medical: Not on file    Non-medical: Not on file  Tobacco Use  . Smoking status: Light Tobacco Smoker    Packs/day: 0.25    Types: Cigarettes  . Smokeless tobacco: Never Used  Substance and Sexual Activity  . Alcohol use: Not on file  . Drug use: Not on file  . Sexual activity: Not  on file  Lifestyle  . Physical activity:    Days per week: Not on file    Minutes per session: Not on file  . Stress: Not on file  Relationships  . Social connections:    Talks on phone: Not on file    Gets together: Not on file    Attends religious service: Not on file    Active member of club or organization: Not on file    Attends meetings of clubs or organizations: Not on file    Relationship status: Not on file  Other Topics Concern  . Not on file  Social History Narrative  . Not on file   Additional Social History:   Sleep: Fair  Appetite:  Fair  Current Medications: Current Facility-Administered Medications  Medication Dose Route Frequency Provider Last Rate Last Dose  . acetaminophen (TYLENOL) tablet 650 mg  650 mg Oral Q6H PRN Jackson [name], Jackson [name], Jackson [name]   650 mg at 10/26/18 2302  . alum & mag hydroxide-simeth (MAALOX/MYLANTA) 200-200-20 MG/5ML suspension 30 mL  30 mL Oral Q4H PRN Jackson [name], Jackson [name], Jackson [name]   30 mL at 10/26/18 2129  . ARIPiprazole (ABILIFY) tablet 15 mg  15 mg Oral QHS Jackson [name], Jackson [name], Jackson [name]   15 mg at 12/18/18 2113  . ARIPiprazole ER (ABILIFY MAINTENA) injection 400 mg  400 mg Intramuscular Q28 days Jackson [name], Jackson [name], Jackson [name]   400 mg at  12/08/18 2217  . divalproex (DEPAKOTE ER) 24 hr tablet 1,000 mg  1,000 mg Oral QHS Jackson [name], Jackson [name], Jackson [name]   1,000 mg at 12/18/18 2113  . hydrOXYzine (ATARAX/VISTARIL) tablet 50 mg  50 mg Oral Q4H PRN Jackson Hart, Jackson [name]   50 mg at 10/28/18 3435  . Influenza vac split quadrivalent PF (FLUARIX) injection 0.5 mL  0.5 mL Intramuscular Once Jackson Rings, Jackson [name]      . magnesium hydroxide (MILK OF MAGNESIA) suspension 30 mL  30 mL Oral Daily PRN Jackson Hart, Jackson [name]      . OLANZapine [facility]) injection 10 mg  10 mg Intramuscular Q6H PRN Jackson [name], Jackson [name], Jackson [name]      . OLANZapine (ZYPREXA) tablet 30 mg  30 mg Oral QHS Jackson Rings, Jackson [name]   30 mg at 12/18/18 2113  . traZODone (DESYREL) tablet 100 mg  100 mg Oral QHS PRN Jackson Hart, Jackson [name]    100 mg at 11/26/18 2123    Lab Results: No results found for this or any previous visit (from the past 48 hour(s)).  Blood Alcohol level:  Lab Results  Component Value Date   ETH <10 10/16/2018    Metabolic Disorder Labs: Lab Results  Component Value Date   HGBA1C 5.2 10/23/2018   MPG 102.54 10/23/2018   No results found for: PROLACTIN Lab Results  Component Value Date   CHOL 129 10/23/2018   TRIG 58 10/23/2018   HDL 50 10/23/2018   CHOLHDL 2.6 10/23/2018   VLDL 12 10/23/2018   LDLCALC 67 10/23/2018    Physical Findings: AIMS: Facial and Oral Movements Muscles of Facial Expression: None, normal Lips and Perioral Area: None, normal Jaw: None, normal Tongue: None, normal,Extremity Movements Upper (arms, wrists, hands, fingers): None, normal Lower (legs, knees, ankles, toes): None, normal, Trunk Movements Neck, shoulders, hips: None, normal, Overall Severity Severity of abnormal movements (highest score from questions above): None, normal Incapacitation due to abnormal movements: None, normal Patient's awareness of abnormal movements (rate only patient's report): No Awareness, Dental Status Current problems with teeth and/or dentures?: No Does patient usually wear dentures?: No  CIWA:  CIWA-Ar Total: 0 COWS:  COWS Total Score: 0  Musculoskeletal: Strength & Muscle Tone: within normal limits Gait & Station: normal Patient leans: N/A  Psychiatric Specialty Exam: Physical Exam  Nursing note and vitals reviewed. Constitutional: Jackson [name] appears well-developed and well-nourished.  HENT:  Head: Normocephalic and atraumatic.  Eyes: Pupils are equal, round, and reactive to light. Conjunctivae are normal.  Neck: Normal range of motion.  Cardiovascular: Normal heart sounds.  Respiratory: Effort normal.  GI: Soft.  Musculoskeletal: Normal range of motion.  Neurological: Jackson [name] is alert.  Skin: Skin is warm and dry.  Psychiatric: Jackson [name] has a normal mood and affect. His behavior  is normal. Judgment and thought content normal.    Review of Systems  Constitutional: Negative.   HENT: Negative.   Eyes: Negative.   Respiratory: Negative.   Cardiovascular: Negative.   Gastrointestinal: Negative.   Musculoskeletal: Negative.   Skin: Negative.   Neurological: Negative.   Psychiatric/Behavioral: Negative.     Blood pressure 125/85, pulse 72, temperature 98 F (36.7 [name]), temperature source Oral, resp. rate 16, height 5\' 11"  (1.803 m), weight 88.5 kg, SpO2 99 %.Body mass index is 27.2 kg/m.  General Appearance: Casual  Eye Contact:  Good  Speech:  Clear and Coherent  Volume:  Normal  Mood:  Euthymic  Affect:  Constricted  Thought Process:  Goal Directed  Orientation:  Full (Time, Place, and Person)  Thought Content:  Logical  Suicidal Thoughts:  No  Homicidal Thoughts:  No  Memory:  Immediate;   Fair Recent;   Fair Remote;   Fair  Judgement:  Fair  Insight:  Fair  Psychomotor Activity:  Normal  Concentration:  Concentration: Fair  Recall:  [employer] of Knowledge:  Fair  Language:  Fair  Akathisia:  No  Handed:  Right  AIMS (if indicated):     Assets:  Communication Skills Desire for Improvement Financial Resources/Insurance Physical Health Resilience Social Support  ADL's:  Intact  Cognition:  WNL  Sleep:  Number of Hours: 6.15     Treatment Plan Summary: Daily contact with patient to assess and evaluate symptoms and progress in treatment and Medication management   Plan  # Schizophrenia -- continue Abilify  daily.  -- continue Abilify Maintena  IM given on 12/08/18.  -- continue zyprexa  qhs -- continue Depakote ER  qhs.  -- continue Trazodone  qhs prn.   # Disposition -- [facility] or [facility].  -- apply for guardianship?    Chrissy Ealey, Jackson [name] 12/19/2018, 1:39 PM

## 2018-12-19 NOTE — Plan of Care (Signed)
Patient is A&O x 4, present in the milieu interacting with select peers. Denies having any thoughts of SI/HI/AVH or pain at this time. Complaint with medication and meals. Mental state is improving, thought process is coherent. Milieu remains safe with q 15 minute safety safety.

## 2018-12-19 NOTE — [facility] Group Notes (Signed)
LCSW Group Therapy Note 12/19/2018 1:15pm  Type of Therapy and Topic: Group Therapy: Feelings Around Returning Home & Establishing a Supportive Framework and Supporting Oneself When Supports Not Available  Participation Level: Active  Description of Group:  Patients first processed thoughts and feelings about upcoming discharge. These included fears of upcoming changes, lack of change, new living environments, judgements and expectations from others and overall stigma of mental health issues. The group then discussed the definition of a supportive framework, what that looks and feels like, and how do to discern it from an unhealthy non-supportive network. The group identified different types of supports as well as what to do when your family/friends are less than helpful or unavailable  Therapeutic Goals  1. Patient will identify one healthy supportive network that they can use at discharge. 2. Patient will identify one factor of a supportive framework and how to tell it from an unhealthy network. 3. Patient able to identify one coping skill to use when they do not have positive supports from others. 4. Patient will demonstrate ability to communicate their needs through discussion and/or role plays.  Summary of Patient Progress:  The patient reported he feels "optimistic." Pt engaged during group session. As patients processed their anxiety about discharge and described healthy supports patient shared he is not ready to be discharge. He stated, "I want to walk out of here with my guardianship."  Patients identified at least one self-care tool they were willing to use after discharge.   Therapeutic Modalities Cognitive Behavioral Therapy Motivational Interviewing   Chelsae Zanella  [name], LCSW 12/19/2018 11:59 AM

## 2018-12-20 NOTE — Plan of Care (Signed)
Patient is A&O x 4, present in the milieu interacting with select peers. Denies having any thoughts of SI/HI/AVH or pain at this time. Complaint with medication and meals. Attends group with active participation. Mental state is improving, thought process is coherent. Milieu remains safe with q 15 minute safety safety.

## 2018-12-20 NOTE — Progress Notes (Signed)
Recreation Therapy Notes    Date: 12/20/2018  Time: 9:30 am  Location: Craft room  Behavioral response: Appropriate   Intervention Topic: Problem Solving  Discussion/Intervention:  Group content on today was focused on problem solving. The group described what problem solving is. Patients expressed how problems affect them and how they deal with problems. Individuals identified healthy ways to deal with problems. Patients explained what normally happens to them when they do not deal with problems. The group expressed reoccurring problems for them. The group participated in the intervention "Ways to Solve problems" where patients were given a chance to explore different ways to solve problems.   Clinical Observations/Feedback:  Patient came to group late due to unknown reasons.Individual was social with peers and staff while participating in the intervention. Ramar Nobrega LRT/CTRS         Senia Even 12/20/2018 11:22 AM

## 2018-12-20 NOTE — Progress Notes (Signed)
[facility] MD Progress Note  12/20/2018 4:05 PM Jackson [name]  MRN:  [id number] Subjective: Patient seen chart reviewed.  Patient has no new complaints.  Physically stable.  No complaints about medicine.  Remains calm with appropriate behavior. Principal Problem: Schizoaffective disorder, bipolar type (HCC) Diagnosis: Principal Problem:   Schizoaffective disorder, bipolar type (HCC) Active Problems:   Noncompliance  Total Time spent with patient: 15 minutes  Past Psychiatric History: Longstanding schizoaffective disorder.  Currently stabilized on medicine  Past Medical History:  Past Medical History:  Diagnosis Date  . Bipolar 1 disorder (HCC)   . Schizoaffective disorder (HCC)    History reviewed. No pertinent surgical history. Family History: History reviewed. No pertinent family history. Family Psychiatric  History: No known history Social History:  Social History   Substance and Sexual Activity  Alcohol Use Not on file     Social History   Substance and Sexual Activity  Drug Use Not on file    Social History   Socioeconomic History  . Marital status: Single    Spouse name: Not on file  . Number of children: Not on file  . Years of education: Not on file  . Highest education level: Not on file  Occupational History  . Not on file  Social Needs  . Financial resource strain: Not on file  . Food insecurity:    Worry: Not on file    Inability: Not on file  . Transportation needs:    Medical: Not on file    Non-medical: Not on file  Tobacco Use  . Smoking status: Light Tobacco Smoker    Packs/day: 0.25    Types: Cigarettes  . Smokeless tobacco: Never Used  Substance and Sexual Activity  . Alcohol use: Not on file  . Drug use: Not on file  . Sexual activity: Not on file  Lifestyle  . Physical activity:    Days per week: Not on file    Minutes per session: Not on file  . Stress: Not on file  Relationships  . Social connections:    Talks on phone: Not on file     Gets together: Not on file    Attends religious service: Not on file    Active member of club or organization: Not on file    Attends meetings of clubs or organizations: Not on file    Relationship status: Not on file  Other Topics Concern  . Not on file  Social History Narrative  . Not on file   Additional Social History:                         Sleep: Fair  Appetite:  Fair  Current Medications: Current Facility-Administered Medications  Medication Dose Route Frequency Provider Last Rate Last Dose  . acetaminophen (TYLENOL) tablet 650 mg  650 mg Oral Q6H PRN [name], MD   650 mg at 10/26/18 2302  . alum & mag hydroxide-simeth (MAALOX/MYLANTA) 200-200-20 MG/5ML suspension 30 mL  30 mL Oral Q4H PRN [name], MD   30 mL at 10/26/18 2129  . ARIPiprazole (ABILIFY) tablet 15 mg  15 mg Oral QHS Charm Rings, NP   15 mg at 12/19/18 2136  . ARIPiprazole ER (ABILIFY MAINTENA) injection 400 mg  400 mg Intramuscular Q28 days Charm Rings, NP   400 mg at 12/08/18 2217  . divalproex (DEPAKOTE ER) 24 hr tablet 1,000 mg  1,000 mg Oral QHS Mickael Mcnutt T,  MD   1,000 mg at 12/19/18 2136  . hydrOXYzine (ATARAX/VISTARIL) tablet 50 mg  50 mg Oral Q4H PRN [name], MD   50 mg at 10/28/18 1610  . Influenza vac split quadrivalent PF (FLUARIX) injection 0.5 mL  0.5 mL Intramuscular Once Charm Rings, NP      . magnesium hydroxide (MILK OF MAGNESIA) suspension 30 mL  30 mL Oral Daily PRN [name], MD      . OLANZapine [facility]) injection 10 mg  10 mg Intramuscular Q6H PRN Eular Panek T, MD      . OLANZapine (ZYPREXA) tablet 30 mg  30 mg Oral QHS Charm Rings, NP   30 mg at 12/19/18 2135  . traZODone (DESYREL) tablet 100 mg  100 mg Oral QHS PRN [name], MD   100 mg at 11/26/18 2123    Lab Results: No results found for this or any previous visit (from the past 48 hour(s)).  Blood Alcohol level:  Lab Results  Component  Value Date   ETH <10 10/16/2018    Metabolic Disorder Labs: Lab Results  Component Value Date   HGBA1C 5.2 10/23/2018   MPG 102.54 10/23/2018   No results found for: PROLACTIN Lab Results  Component Value Date   CHOL 129 10/23/2018   TRIG 58 10/23/2018   HDL 50 10/23/2018   CHOLHDL 2.6 10/23/2018   VLDL 12 10/23/2018   LDLCALC 67 10/23/2018    Physical Findings: AIMS: Facial and Oral Movements Muscles of Facial Expression: None, normal Lips and Perioral Area: None, normal Jaw: None, normal Tongue: None, normal,Extremity Movements Upper (arms, wrists, hands, fingers): None, normal Lower (legs, knees, ankles, toes): None, normal, Trunk Movements Neck, shoulders, hips: None, normal, Overall Severity Severity of abnormal movements (highest score from questions above): None, normal Incapacitation due to abnormal movements: None, normal Patient's awareness of abnormal movements (rate only patient's report): No Awareness, Dental Status Current problems with teeth and/or dentures?: No Does patient usually wear dentures?: No  CIWA:  CIWA-Ar Total: 0 COWS:  COWS Total Score: 0  Musculoskeletal: Strength & Muscle Tone: within normal limits Gait & Station: normal Patient leans: N/A  Psychiatric Specialty Exam: Physical Exam  Nursing note and vitals reviewed. Constitutional: He appears well-developed and well-nourished.  HENT:  Head: Normocephalic and atraumatic.  Eyes: Pupils are equal, round, and reactive to light. Conjunctivae are normal.  Neck: Normal range of motion.  Cardiovascular: Regular rhythm and normal heart sounds.  Respiratory: Effort normal. No respiratory distress.  GI: Soft.  Musculoskeletal: Normal range of motion.  Neurological: He is alert.  Skin: Skin is warm and dry.  Psychiatric: He has a normal mood and affect. His behavior is normal. Judgment and thought content normal.    Review of Systems  Constitutional: Negative.   HENT: Negative.   Eyes:  Negative.   Respiratory: Negative.   Cardiovascular: Negative.   Gastrointestinal: Negative.   Musculoskeletal: Negative.   Skin: Negative.   Neurological: Negative.   Psychiatric/Behavioral: Negative.     Blood pressure 101/76, pulse 71, temperature 97.6 F (36.4 C), temperature source Oral, resp. rate 18, height  (1.803 m), weight 88.5 kg, SpO2 100 %.Body mass index is 27.2 kg/m.  General Appearance: Fairly Groomed  Eye Contact:  Fair  Speech:  Clear and Coherent  Volume:  Normal  Mood:  Euthymic  Affect:  Appropriate  Thought Process:  Goal Directed  Orientation:  Full (Time, Place, and Person)  Thought Content:  Logical  Suicidal Thoughts:  No  Homicidal Thoughts:  No  Memory:  Immediate;   Fair Recent;   Fair Remote;   Fair  Judgement:  Fair  Insight:  Fair  Psychomotor Activity:  Normal  Concentration:  Concentration: Fair  Recall:  [employer]Fair  Fund of Knowledge:  Fair  Language:  Fair  Akathisia:  No  Handed:  Right  AIMS (if indicated):     Assets:  Desire for Improvement  ADL's:  Intact  Cognition:  WNL  Sleep:  Number of Hours: 4.25     Treatment Plan Summary: Daily contact with patient to assess and evaluate symptoms and progress in treatment, Medication management and Plan Continue current medication.  No indication to change any medicine.  Supportive therapy and encouragement.  We are really looking for some way to get him into a group home at the earliest opportunity but no one has offered a bed at this point.  Patient has a guardian and cannot live with his mother and is unable to take care of himself independently.  Jackson RasmussenJohn Lydiann Bonifas, MD 12/20/2018, 4:05 PM

## 2018-12-20 NOTE — BHH Counselor (Signed)
CSW called the following group homes to follow up on referrals:  [facility],[phone] No answer CSW left HIPAA compliant voicemail.  [facility], [phone] AND [facility], [phone]  CSW called [place], [phone], and was unable to speak with him and left HIPAA compliant voicemail.  Penni Homans, MSW, LCSW 12/20/2018 11:51 AM

## 2018-12-20 NOTE — BHH Counselor (Signed)
CSW followed up with Tiffany at [facility], [phone].  CSW reviewed placement history and reason pt can not return to his previous group home.    Tiffany reports that she has reviewed the paperwork and will refer to the referral team to review and follow up once she has got an answer and call CSW back.   Penni Homans, MSW, LCSW 12/20/2018 11:48 AM

## 2018-12-20 NOTE — BHH Group Notes (Signed)
LCSW Group Therapy Note   12/20/2018 1:00 PM  Type of Therapy and Topic:  Group Therapy:  Overcoming Obstacles   Participation Level:  Active   Description of Group:    In this group patients will be encouraged to explore what they see as obstacles to their own wellness and recovery. They will be guided to discuss their thoughts, feelings, and behaviors related to these obstacles. The group will process together ways to cope with barriers, with attention given to specific choices patients can make. Each patient will be challenged to identify changes they are motivated to make in order to overcome their obstacles. This group will be process-oriented, with patients participating in exploration of their own experiences as well as giving and receiving support and challenge from other group members.   Therapeutic Goals: 1. Patient will identify personal and current obstacles as they relate to admission. 2. Patient will identify barriers that currently interfere with their wellness or overcoming obstacles.  3. Patient will identify feelings, thought process and behaviors related to these barriers. 4. Patient will identify two changes they are willing to make to overcome these obstacles:      Summary of Patient Progress Patient was present in group and engaged in group discussions.  Patient discussed how his mother has been an obstacle for him.  Patient was able to articulate his thoughts and feelings on the situation.  Patient struggled with identifying or taking ownership to his role in how the relationship with his mother has been less than what he wanted.    Therapeutic Modalities:   Cognitive Behavioral Therapy Solution Focused Therapy Motivational Interviewing Relapse Prevention Therapy  Penni Homans, MSW, LCSW 12/20/2018 12:46 PM

## 2018-12-21 MED ORDER — HYDROCERIN EX CREA
TOPICAL_CREAM | Freq: Two times a day (BID) | CUTANEOUS | Status: DC
  Administered 2018-12-21 – 2018-12-22 (×3): via TOPICAL
  Administered 2018-12-23: 1 via TOPICAL
  Administered 2018-12-23 – 2018-12-27 (×4): via TOPICAL
  Administered 2018-12-28 (×2): 1 via TOPICAL
  Administered 2018-12-29 – 2018-12-31 (×3): via TOPICAL
  Filled 2018-12-21 (×2): qty 113

## 2018-12-21 NOTE — BHH Counselor (Signed)
CSW followed up with the following:  [facility], [phone] AND [facility], [phone] CSW called [place], [phone] who repots that the owner has not followed up with him at this time.  Penni Homans, MSW, LCSW 12/21/2018 1:57 PM

## 2018-12-21 NOTE — BHH Counselor (Signed)
CSW has heard back from [facility] group home.  [facility] Grist had a staff member call and decline the patient as she doesn't want to place anyone right now due to Covid-19.  CSW pointed out that patient has been tested and results were negative, however, the group home continued to deny.  Penni Homans, MSW, LCSW 12/21/2018 9:19 AM

## 2018-12-21 NOTE — Plan of Care (Signed)
Patient still thinks he can still bend time and make orbs appear out of his fingers.   Problem: Education: Goal: Will be free of psychotic symptoms Outcome: Not Progressing

## 2018-12-21 NOTE — BHH Group Notes (Signed)
Feelings Around Diagnosis 12/21/2018 1PM  Type of Therapy/Topic:  Group Therapy:  Feelings about Diagnosis  Participation Level:  Did Not Attend   Description of Group:   This group will allow patients to explore their thoughts and feelings about diagnoses they have received. Patients will be guided to explore their level of understanding and acceptance of these diagnoses. Facilitator will encourage patients to process their thoughts and feelings about the reactions of others to their diagnosis and will guide patients in identifying ways to discuss their diagnosis with significant others in their lives. This group will be process-oriented, with patients participating in exploration of their own experiences, giving and receiving support, and processing challenge from other group members.   Therapeutic Goals: 1. Patient will demonstrate understanding of diagnosis as evidenced by identifying two or more symptoms of the disorder 2. Patient will be able to express two feelings regarding the diagnosis 3. Patient will demonstrate their ability to communicate their needs through discussion and/or role play  Summary of Patient Progress:       Therapeutic Modalities:   Cognitive Behavioral Therapy Brief Therapy Feelings Identification    Deklan Minar T Capri Veals, LCSW 12/21/2018 2:01 PM  

## 2018-12-21 NOTE — Progress Notes (Signed)
Recreation Therapy Notes  Date: 12/21/2018  Time: 9:30 am   Location: Craft room   Behavioral response: N/A   Intervention Topic: Self-esteem  Discussion/Intervention: Patient did not attend group.   Clinical Observations/Feedback:  Patient did not attend group.   Nacole Fluhr LRT/CTRS        Master Touchet 12/21/2018 11:00 AM

## 2018-12-21 NOTE — Progress Notes (Signed)
D - Patient was in his room upon arrival to the unit. Patient was pleasant during assessment and medication administration. Patient denies SI/HI/AVH, pain, anxiety and depression with this Clinical research associate.Patient still believes that he can bend time and make orbs appear out of his fingers. Patient was in a bad mood on the unit this evening. When assessed patient said, "I am just having a bad day, the nurse earlier had to put me in my place." Patient was laughing at other patients for not having teeth. Patient was in snack room during snack time and was singing, visibly upsetting other patients. Patient was given education.   A - Patient was compliant with medication administration per MD orders and procedures on the unit. Patient given education. Patient given support and encouragement to be active in his treatment plan. Patient informed to let staff know if there are any issues or problems on the unit.   R - Patient being monitored Q 15 minutes for safety per unit protocol. Patient remains safe on the unit.

## 2018-12-21 NOTE — Progress Notes (Signed)
Pastoral Care Visit   12/21/18 1455  Clinical Encounter Type  Visited With Patient  Visit Type Follow-up;Psychological support;Spiritual support;Behavioral Health  Referral From Patient  Consult/Referral To Chaplain  Spiritual Encounters  Spiritual Needs Emotional  Stress Factors  Patient Stress Factors Family relationships;Financial concerns;Loss of control;Major life changes   Mirna Mires was approached by pt in day room and requested to visit with pt.  Pt was smiling and indicated that he was in a great mood.  Pt shared sever rap songs, theological viewpoints, scripture, plans for financial and personal independence. Pt also shared that he blew a recent interview for group home leading chap to believe it may have been on purpose. Pt states that he has everything he needs right her and that June 1 he is receiving some financial means directly here.  Pt shared, as usual, about his frustration w/ mom and desire for emancipation. Asked chap to be an advocate for him if needed for emancipation process.  This chap gave pt his professional email address through [facility]. Mirna Mires encourages pt toward self awareness, less religious focus in nonreligious settings, and to "feed to right wolf" (reference to Native American story of two wolves raging inside all people).  Pt and chap ended with friendly chat and exchange of various fist bumps, handshakes, etc.  Pt has good connection w/ this chap.  Milinda Antis, [address]

## 2018-12-21 NOTE — Progress Notes (Signed)
[facility]  12/21/2018 3:52 PM Jackson [name]  MRN:  [id number] Subjective: Patient seen.  Follow-up for this gentleman with schizoaffective disorder who remains stable.  He has no new complaints.  Affect is calm and appropriate.  Lucid in his conversation.  Getting along with staff and patients without difficulty. Principal Problem: Schizoaffective disorder, bipolar type (HCC) Diagnosis: Principal Problem:   Schizoaffective disorder, bipolar type (HCC) Active Problems:   Noncompliance  Total Time spent with patient: 15 minutes  Past Psychiatric History: Bipolar disorder several prior admissions  Past Medical History:  Past Medical History:  Diagnosis Date  . Bipolar 1 disorder (HCC)   . Schizoaffective disorder (HCC)    History reviewed. No pertinent surgical history. Family History: History reviewed. No pertinent family history. Family Psychiatric  History: See previous Social History:  Social History   Substance and Sexual Activity  Alcohol Use Not on file     Social History   Substance and Sexual Activity  Drug Use Not on file    Social History   Socioeconomic History  . Marital status: Single    Spouse name: Not on file  . Number of children: Not on file  . Years of education: Not on file  . Highest education level: Not on file  Occupational History  . Not on file  Social Needs  . Financial resource strain: Not on file  . Food insecurity:    Worry: Not on file    Inability: Not on file  . Transportation needs:    Medical: Not on file    Non-medical: Not on file  Tobacco Use  . Smoking status: Light Tobacco Smoker    Packs/day: 0.25    Types: Cigarettes  . Smokeless tobacco: Never Used  Substance and Sexual Activity  . Alcohol use: Not on file  . Drug use: Not on file  . Sexual activity: Not on file  Lifestyle  . Physical activity:    Days per week: Not on file    Minutes per session: Not on file  . Stress: Not on file  Relationships   . Social connections:    Talks on phone: Not on file    Gets together: Not on file    Attends religious service: Not on file    Active member of club or organization: Not on file    Attends meetings of clubs or organizations: Not on file    Relationship status: Not on file  Other Topics Concern  . Not on file  Social History Narrative  . Not on file   Additional Social History:                         Sleep: Fair  Appetite:  Fair  Current Medications: Current Facility-Administered Medications  Medication Dose Route Frequency Provider Last Rate Last Dose  . acetaminophen (TYLENOL) tablet 650 mg  650 mg Oral Q6H PRN Terance HartStephens, [name], MD   650 mg at 10/26/18 2302  . alum & mag hydroxide-simeth (MAALOX/MYLANTA) 200-200-20 MG/5ML suspension 30 mL  30 mL Oral Q4H PRN Terance HartStephens, [name], MD   30 mL at 10/26/18 2129  . ARIPiprazole (ABILIFY) tablet 15 mg  15 mg Oral QHS Charm RingsLord, Jamison Y, NP   15 mg at 12/20/18 2246  . ARIPiprazole ER (ABILIFY MAINTENA) injection 400 mg  400 mg Intramuscular Q28 days Charm RingsLord, Jamison Y, NP   400 mg at 12/08/18 2217  . divalproex (DEPAKOTE ER) 24 hr  tablet 1,000 mg  1,000 mg Oral QHS Cyndia Degraff T, MD   1,000 mg at 12/20/18 2246  . hydrocerin (EUCERIN) cream   Topical BID Jakobe Blau T, MD      . hydrOXYzine (ATARAX/VISTARIL) tablet 50 mg  50 mg Oral Q4H PRN [name], MD   50 mg at 10/28/18 3710  . Influenza vac split quadrivalent PF (FLUARIX) injection 0.5 mL  0.5 mL Intramuscular Once Charm Rings, NP      . magnesium hydroxide (MILK OF MAGNESIA) suspension 30 mL  30 mL Oral Daily PRN [name], MD      . OLANZapine [facility]) injection 10 mg  10 mg Intramuscular Q6H PRN Law Corsino T, MD      . OLANZapine (ZYPREXA) tablet 30 mg  30 mg Oral QHS Charm Rings, NP   30 mg at 12/20/18 2246  . traZODone (DESYREL) tablet 100 mg  100 mg Oral QHS PRN [name], MD   100 mg at 11/26/18 2123    Lab Results: No  results found for this or any previous visit (from the past 48 hour(s)).  Blood Alcohol level:  Lab Results  Component Value Date   ETH <10 10/16/2018    Metabolic Disorder Labs: Lab Results  Component Value Date   HGBA1C 5.2 10/23/2018   MPG 102.54 10/23/2018   No results found for: PROLACTIN Lab Results  Component Value Date   CHOL 129 10/23/2018   TRIG 58 10/23/2018   HDL 50 10/23/2018   CHOLHDL 2.6 10/23/2018   VLDL 12 10/23/2018   LDLCALC 67 10/23/2018    Physical Findings: AIMS: Facial and Oral Movements Muscles of Facial Expression: None, normal Lips and Perioral Area: None, normal Jaw: None, normal Tongue: None, normal,Extremity Movements Upper (arms, wrists, hands, fingers): None, normal Lower (legs, knees, ankles, toes): None, normal, Trunk Movements Neck, shoulders, hips: None, normal, Overall Severity Severity of abnormal movements (highest score from questions above): None, normal Incapacitation due to abnormal movements: None, normal Patient's awareness of abnormal movements (rate only patient's report): No Awareness, Dental Status Current problems with teeth and/or dentures?: No Does patient usually wear dentures?: No  CIWA:  CIWA-Ar Total: 0 COWS:  COWS Total Score: 0  Musculoskeletal: Strength & Muscle Tone: within normal limits Gait & Station: normal Patient leans: N/A  Psychiatric Specialty Exam: Physical Exam  Nursing note and vitals reviewed. Constitutional: He appears well-developed and well-nourished.  HENT:  Head: Normocephalic and atraumatic.  Eyes: Pupils are equal, round, and reactive to light. Conjunctivae are normal.  Neck: Normal range of motion.  Cardiovascular: Regular rhythm and normal heart sounds.  Respiratory: Effort normal.  GI: Soft.  Musculoskeletal: Normal range of motion.  Neurological: He is alert.  Skin: Skin is warm and dry.  Psychiatric: He has a normal mood and affect. His behavior is normal. Judgment and  thought content normal.    Review of Systems  Constitutional: Negative.   HENT: Negative.   Eyes: Negative.   Respiratory: Negative.   Cardiovascular: Negative.   Gastrointestinal: Negative.   Musculoskeletal: Negative.   Skin: Negative.   Neurological: Negative.   Psychiatric/Behavioral: Negative.     Blood pressure 106/84, pulse 65, temperature 98 F (36.7 C), temperature source Oral, resp. rate 16, height 5\' 11"  (1.803 m), weight 88.5 kg, SpO2 98 %.Body mass index is 27.2 kg/m.  General Appearance: Fairly Groomed  Eye Contact:  Fair  Speech:  Clear and Coherent  Volume:  Normal  Mood:  Euthymic  Affect:  Constricted  Thought Process:  Goal Directed  Orientation:  Full (Time, Place, and Person)  Thought Content:  Logical  Suicidal Thoughts:  No  Homicidal Thoughts:  No  Memory:  Immediate;   Fair Recent;   Fair Remote;   Fair  Judgement:  Fair  Insight:  Fair  Psychomotor Activity:  Decreased  Concentration:  Concentration: Fair  Recall:  [employer] of Knowledge:  Fair  Language:  Fair  Akathisia:  No  Handed:  Right  AIMS (if indicated):     Assets:  Desire for Improvement Physical Health  ADL's:  Intact  Cognition:  WNL  Sleep:  Number of Hours: 6     Treatment Plan Summary: Daily contact with patient to assess and evaluate symptoms and progress in treatment, Medication management and Plan Vital signs stable.  Patient stable.  Really nothing to do.  We are stuck in the difficult situation of trying to find a group home that will accept him which so far has proved impossible.  Guardian will not take him back to her home.  Meanwhile patient is calm and appropriate although he continues to work on his project of trying to do the legal paperwork to reestablish his own guardianship.  Mordecai Rasmussen, MD 12/21/2018, 3:52 PM

## 2018-12-21 NOTE — BHH Counselor (Signed)
CSW faxed FL2 and most recent psycho note to [facility], who has a group home as well.  CSW received confirmation that fax was successful.   Penni Homans, MSW, LCSW 12/21/2018 2:29 PM

## 2018-12-21 NOTE — Plan of Care (Signed)
Pt states he slept well. Pt denies SI, HI, AVH, depression and anxiety. Pt was educated on care plan and verbalizes understanding. Torrie Mayers RN.  Problem: Education: Goal: Knowledge of  General Education information/materials will improve Outcome: Progressing   Problem: Coping: Goal: Ability to verbalize frustrations and anger appropriately will improve Outcome: Progressing Goal: Ability to demonstrate self-control will improve Outcome: Progressing   Problem: Safety: Goal: Periods of time without injury will increase Outcome: Progressing   Problem: Education: Goal: Will be free of psychotic symptoms Outcome: Progressing Goal: Knowledge of the prescribed therapeutic regimen will improve Outcome: Progressing   Problem: Self-Concept: Goal: Will verbalize positive feelings about self Outcome: Progressing   Problem: Self-Concept: Goal: Will verbalize positive feelings about self Outcome: Progressing   Problem: Education: Goal: Ability to state activities that reduce stress will improve Outcome: Progressing

## 2018-12-22 NOTE — BHH Group Notes (Signed)
[facility] Group Notes:  (Nursing/MHT/Case Management/Adjunct)  Date:  12/22/2018  Time:  9:42 PM  Type of Therapy:  Group Therapy  Participation Level:  Active  Participation Quality:  Monopolizing  Affect:  Lethargic  Cognitive:  Alert  Insight:  Limited  Engagement in Group:  Distracting  Modes of Intervention:  Socialization  Summary of Progress/Problems:  Jackson [name] 12/22/2018, 9:42 PM

## 2018-12-22 NOTE — Progress Notes (Signed)
Recreation Therapy Notes   Date: 12/22/2018  Time: 9:30 am   Location: Craft room   Behavioral response: N/A   Intervention Topic: Stress  Discussion/Intervention: Patient did not attend group.   Clinical Observations/Feedback:  Patient did not attend group.   [name] LRT/CTRS        Jackson [name] 12/22/2018 10:45 AM 

## 2018-12-22 NOTE — Plan of Care (Signed)
Pt stated he "slept well". Pt denies SI, HI, AVH, depression and anxiety. His goal today is to "read and write". Pt was educated on care pan and verbalizes understanding. Torrie Mayers RN Problem: Education: Goal: Knowledge of [facility] Education information/materials will improve Outcome: Progressing   Problem: Coping: Goal: Ability to verbalize frustrations and anger appropriately will improve Outcome: Progressing Goal: Ability to demonstrate self-control will improve Outcome: Progressing   Problem: Safety: Goal: Periods of time without injury will increase Outcome: Progressing   Problem: Education: Goal: Will be free of psychotic symptoms Outcome: Progressing Goal: Knowledge of the prescribed therapeutic regimen will improve Outcome: Progressing   Problem: Self-Concept: Goal: Will verbalize positive feelings about self Outcome: Progressing   Problem: Education: Goal: Ability to state activities that reduce stress will improve Outcome: Progressing

## 2018-12-22 NOTE — BHH Counselor (Signed)
CSW has staffed this patient with several parties:  CSW has spoke with Dr. Toni Amend several times on the patient's plan for discharge.  Dr. Toni Amend reports that at this time the plan is for the patient to be discharged once his commitment has run out in June.  CSW had this conversation with Dr. Toni Amend before group and once again after treatment team to clarify that this was the plan.  CSW spoke with Inge Rise Director to inform of Dr. Shary Key plan.  She expressed concern that the pt should be discharged at this time due to no longer meeting medical need.   CSW suggested that conversation be had with Dr. Toni Amend as he ultimately makes decisions on discharging patients.     CSW also spoke with Wandra Mannan, CSW Supervisor to discuss the patient.  CSW provided backstory on patient and progress CSW has made with contacting group homes, discussing with mother and developing plans for this patient's treatment.  CSW informed that patient has a legal guardian.  CSW asked if she needed to be the one to schedule a meeting and was informed that she should.     CSW had suggested at morning progression having a treatment team/phone conference with the mother at this time so that all parties are on the same page.  In speaking with Otto Herb and Dr. Toni Amend, CSW confirmed a time that worked for all parties.   CSW called the mother and scheduled the meeting for 9 am 12/23/2018.  Penni Homans, MSW, LCSW 12/22/2018 4:26 PM

## 2018-12-22 NOTE — Tx Team (Signed)
Interdisciplinary Treatment and Diagnostic Plan Update  12/22/2018 Time of Session: 8:30AM  Jackson [name] MRN: [id number]  Principal Diagnosis: Schizoaffective disorder, bipolar type [facility])  Secondary Diagnoses: Principal Problem:   Schizoaffective disorder, bipolar type (HCC) Active Problems:   Noncompliance   Current Medications:  Current Facility-Administered Medications  Medication Dose Route Frequency Provider Last Rate Last Dose  . acetaminophen (TYLENOL) tablet 650 mg  650 mg Oral Q6H PRN [name], MD   650 mg at 10/26/18 2302  . alum & mag hydroxide-simeth (MAALOX/MYLANTA) 200-200-20 MG/5ML suspension 30 mL  30 mL Oral Q4H PRN [name], MD   30 mL at 10/26/18 2129  . ARIPiprazole ER (ABILIFY MAINTENA) injection 400 mg  400 mg Intramuscular Q28 days Charm Rings, NP   400 mg at 12/08/18 2217  . divalproex (DEPAKOTE ER) 24 hr tablet 1,000 mg  1,000 mg Oral QHS [name], [name], MD   1,000 mg at 12/21/18 2148  . hydrocerin (EUCERIN) cream   Topical BID [name], [name], MD      . hydrOXYzine (ATARAX/VISTARIL) tablet 50 mg  50 mg Oral Q4H PRN [name], MD   50 mg at 10/28/18 2229  . Influenza vac split quadrivalent PF (FLUARIX) injection 0.5 mL  0.5 mL Intramuscular Once Charm Rings, NP      . magnesium hydroxide (MILK OF MAGNESIA) suspension 30 mL  30 mL Oral Daily PRN [name], MD      . OLANZapine [facility]) injection 10 mg  10 mg Intramuscular Q6H PRN [name], [name], MD      . OLANZapine (ZYPREXA) tablet 30 mg  30 mg Oral QHS Charm Rings, NP   30 mg at 12/21/18 2148  . traZODone (DESYREL) tablet 100 mg  100 mg Oral QHS PRN [name], MD   100 mg at 11/26/18 2123   PTA Medications: No medications prior to admission.    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   Physician Treatment Plan for Primary Diagnosis: Schizoaffective disorder, bipolar type (HCC) Long Term Goal(s): Improvement in symptoms so as ready for discharge Improvement in symptoms so as ready for discharge   Short Term Goals: Ability to demonstrate self-control will improve Ability to identify and develop effective coping behaviors will improve Compliance with prescribed medications will improve  Medication Management: Evaluate patient's response, side effects, and tolerance of medication regimen.  Therapeutic Interventions: 1 to 1 [place], Unit Group [place] and Medication administration.  Evaluation of Outcomes: Progressing  Physician Treatment Plan for Secondary Diagnosis: Principal Problem:   Schizoaffective disorder, bipolar type (HCC) Active Problems:   Noncompliance  Long Term Goal(s): Improvement in symptoms so as ready for discharge Improvement in symptoms so as ready for discharge   Short Term Goals: Ability to demonstrate self-control will improve Ability to identify and develop effective coping behaviors will improve Compliance with prescribed medications will improve     Medication Management: Evaluate patient's response, side effects, and tolerance of medication regimen.  Therapeutic Interventions: 1 to 1 [place], Unit Group [place] and Medication administration.  Evaluation of Outcomes: Progressing   RN Treatment Plan for Primary Diagnosis: Schizoaffective disorder, bipolar type (HCC) Long Term Goal(s): Knowledge of disease and therapeutic regimen to maintain health will improve  Short Term Goals: Ability to verbalize frustration and anger  appropriately will improve, Ability to demonstrate self-control, Ability to participate in decision making will improve and Ability to verbalize feelings will improve  Medication  Management: RN will administer medications as ordered by provider, will assess and evaluate patient's response and provide education to patient for prescribed medication. RN will report any adverse and/or side effects to prescribing provider.  Therapeutic Interventions: 1 on 1 counseling [name], Psychoeducation, Medication administration, Evaluate responses to treatment, Monitor vital signs and CBGs as ordered, Perform/monitor CIWA, COWS, AIMS and Fall Risk screenings as ordered, Perform wound care treatments as ordered.  Evaluation of Outcomes: Progressing   LCSW Treatment Plan for Primary Diagnosis: Schizoaffective disorder, bipolar type (HCC) Long Term Goal(s): Safe transition to appropriate next level of care at discharge, Engage patient in therapeutic group addressing interpersonal concerns.  Short Term Goals: Engage patient in aftercare planning with referrals and resources, Increase social support, Increase ability to appropriately verbalize feelings, Increase emotional regulation and Facilitate acceptance of mental health diagnosis and concerns  Therapeutic Interventions: Assess for all discharge needs, 1 to 1 time with Social worker, Explore available resources and support systems, Assess for adequacy in community support network, Educate family and significant other(s) on suicide prevention, Complete Psychosocial Assessment, Interpersonal group therapy.  Evaluation of Outcomes: Progressing   Progress in Treatment: Attending groups: Yes. Participating in groups: Yes. Taking medication as prescribed: Yes.   Toleration medication: Yes. Family/Significant other contact made: Yes, individual(s) contacted:  CSW completed SPE with the patients mother.  Patient understands diagnosis: Yes. Discussing patient identified problems/goals with staff: Yes. Medical problems stabilized or resolved: Yes. Denies suicidal/homicidal ideation: Yes. Issues/concerns per patient self-inventory:  No. Other: none  New problem(s) identified: No, Describe:  none  New Short Term/Long Term Goal(s): elimination of symptoms of psychosis, medication management for mood stabilization; elimination of SI thoughts; development of comprehensive mental wellness plan.  Patient Goals:  "independence"  Discharge Plan or Barriers: Pt's mother is his legal guardian.  Mother has agreed to referrals to group homes following a staffing with Nanine MeansJamison Lord, NP and Dr. Lucianne MussKumar.  CSW has sent referrals to group homes in [place], [place], [place], [place], [place], [place], [place], [place], [place], [place] and [place].  Most of the group homes have declined the patient for various reasons.  Patient has [facility] due stability.  Though quite a few group homes have been contacted, only three are possibilities at this time.        Reason for Continuation of Hospitalization: Aggression Delusions  Hallucinations [place]ia Medication stabilization  Estimated Length of Stay: TBD  Recreational Therapy: Patient Stressors: N/A Patient Goal: Patient will focus on task/topic with 2 prompts from staff within 5 recreation therapy group [place]  Attendees: Patient:  12/22/2018 3:55 PM  Physician: Dr. Toni Amendlapacs, MD 12/22/2018 3:55 PM  Nursing:  12/22/2018 3:55 PM  RN Care Manager: 12/22/2018 3:55 PM  Social Worker: Penni HomansMichaela Miguel Medal, LCSW 12/22/2018 3:55 PM  Recreational Therapist:  12/22/2018 3:55 PM  Other:  12/22/2018 3:55 PM  Other:  12/22/2018 3:55 PM  Other: 12/22/2018 3:55 PM    Scribe for Treatment Team: Harden MoMichaela J Adabelle Griffiths, LCSW 12/22/2018 3:55 PM

## 2018-12-22 NOTE — BHH Counselor (Signed)
CSW contacted the following to follow up on group home referrals:  Melrose Nakayama, [phone] Reports that she will need to call QP that completed video conference then follow up.  [facility] [employer], [phone] AND [facility], [phone] CSWcalled [phone], left HIPAA compliant voicemail.   Scarlette Calico, [phone] Ms. Scarlette Calico reports that the QP will review at 5:30 pm, she asked that I follow up 12/23/18 at 8AM.  Penni Homans, MSW, LCSW 12/22/2018 4:06 PM

## 2018-12-22 NOTE — Plan of Care (Signed)
  Problem: Education: Goal: Will be free of psychotic symptoms Outcome: Progressing  Patient appears free of psychosis.  

## 2018-12-22 NOTE — Progress Notes (Signed)
Patient alert and oriented x 4, denies SI/HI/AVH no disruptive  or argumentative behavior with staffduring this shift,affect is blunted, heisinteractingwith staff and peersappropriately. Patient's thoughts are organized and coherent no bizarre behavior and he is receptive to staff. Patient was complaint with prescribed medication regimen and he attended evening wrap up group. 15 minutes safety checks maintained will continue to monitor

## 2018-12-22 NOTE — BHH Group Notes (Signed)
LCSW Group Therapy Note  12/22/2018 1:00 PM  Type of Therapy/Topic:  Group Therapy:  Emotion Regulation  Participation Level:  None   Description of Group:   The purpose of this group is to assist patients in learning to regulate negative emotions and experience positive emotions. Patients will be guided to discuss ways in which they have been vulnerable to their negative emotions. These vulnerabilities will be juxtaposed with experiences of positive emotions or situations, and patients will be challenged to use positive emotions to combat negative ones. Special emphasis will be placed on coping with negative emotions in conflict situations, and patients will process healthy conflict resolution skills.  Therapeutic Goals: 1. Patient will identify two positive emotions or experiences to reflect on in order to balance out negative emotions 2. Patient will label two or more emotions that they find the most difficult to experience 3. Patient will demonstrate positive conflict resolution skills through discussion and/or role plays  Summary of Patient Progress: Patient arrived for group, just as group was ending.    Therapeutic Modalities:   Cognitive Behavioral Therapy Feelings Identification Dialectical Behavioral Therapy  Penni Homans, MSW, LCSW 12/22/2018 12:45 PM

## 2018-12-22 NOTE — Progress Notes (Signed)
[facility] MD Progress Note  12/22/2018 3:54 PM Jackson [name]  MRN:  [id number] Subjective: Patient seen.  No changes in situation.  No behavior problems.  Thinking appears to be generally clear and lucid.  No evidence of acute dangerousness.  We continue to have no luck whatsoever and finding a place for him to live Principal Problem: Schizoaffective disorder, bipolar type ([place]) Diagnosis: Principal Problem:   Schizoaffective disorder, bipolar type ([place]) Active Problems:   Noncompliance  Total Time spent with patient: 15 minutes  Past Psychiatric History: Bipolar disorder  Past Medical History:  Past Medical History:  Diagnosis Date  . Bipolar 1 disorder ([place])   . Schizoaffective disorder ([place])    History reviewed. No pertinent surgical history. Family History: History reviewed. No pertinent family history. Family Psychiatric  History: See previous Social History:  Social History   Substance and Sexual Activity  Alcohol Use Not on file     Social History   Substance and Sexual Activity  Drug Use Not on file    Social History   Socioeconomic History  . Marital status: Single    Spouse name: Not on file  . Number of children: Not on file  . Years of education: Not on file  . Highest education level: Not on file  Occupational History  . Not on file  Social Needs  . Financial resource strain: Not on file  . Food insecurity:    Worry: Not on file    Inability: Not on file  . Transportation needs:    Medical: Not on file    Non-medical: Not on file  Tobacco Use  . Smoking status: Light Tobacco Smoker    Packs/day: 0.25    Types: Cigarettes  . Smokeless tobacco: Never Used  Substance and Sexual Activity  . Alcohol use: Not on file  . Drug use: Not on file  . Sexual activity: Not on file  Lifestyle  . Physical activity:    Days per week: Not on file    Minutes per session: Not on file  . Stress: Not on file  Relationships  . Social connections:    Talks on  phone: Not on file    Gets together: Not on file    Attends religious service: Not on file    Active member of club or organization: Not on file    Attends meetings of clubs or organizations: Not on file    Relationship status: Not on file  Other Topics Concern  . Not on file  Social History Narrative  . Not on file   Additional Social History:                         Sleep: Fair  Appetite:  Fair  Current Medications: Current Facility-Administered Medications  Medication Dose Route Frequency Provider Last Rate Last Dose  . acetaminophen (TYLENOL) tablet 650 mg  650 mg Oral Q6H PRN Patience Musca, MD   650 mg at 10/26/18 2302  . alum & mag hydroxide-simeth (MAALOX/MYLANTA) 200-200-20 MG/5ML suspension 30 mL  30 mL Oral Q4H PRN Patience Musca, MD   30 mL at 10/26/18 2129  . ARIPiprazole ER (ABILIFY MAINTENA) injection 400 mg  400 mg Intramuscular Q28 days Patrecia Pour, NP   400 mg at 12/08/18 2217  . divalproex (DEPAKOTE ER) 24 hr tablet 1,000 mg  1,000 mg Oral QHS ,  T, MD   1,000 mg at 12/21/18 2148  . hydrocerin (EUCERIN)  cream   Topical BID ,  T, MD      . hydrOXYzine (ATARAX/VISTARIL) tablet 50 mg  50 mg Oral Q4H PRN Patience Musca, MD   50 mg at 10/28/18 6767  . Influenza vac split quadrivalent PF (FLUARIX) injection 0.5 mL  0.5 mL Intramuscular Once Patrecia Pour, NP      . magnesium hydroxide (MILK OF MAGNESIA) suspension 30 mL  30 mL Oral Daily PRN Patience Musca, MD      . OLANZapine [facility]) injection 10 mg  10 mg Intramuscular Q6H PRN ,  T, MD      . OLANZapine (ZYPREXA) tablet 30 mg  30 mg Oral QHS Patrecia Pour, NP   30 mg at 12/21/18 2148  . traZODone (DESYREL) tablet 100 mg  100 mg Oral QHS PRN Patience Musca, MD   100 mg at 11/26/18 2123    Lab Results: No results found for this or any previous visit (from the past 48 hour(s)).  Blood Alcohol level:  Lab Results  Component Value Date    ETH <10 20/94/7096    Metabolic Disorder Labs: Lab Results  Component Value Date   HGBA1C 5.2 10/23/2018   MPG 102.54 10/23/2018   No results found for: PROLACTIN Lab Results  Component Value Date   CHOL 129 10/23/2018   TRIG 58 10/23/2018   HDL 50 10/23/2018   CHOLHDL 2.6 10/23/2018   VLDL 12 10/23/2018   LDLCALC 67 10/23/2018    Physical Findings: AIMS: Facial and Oral Movements Muscles of Facial Expression: None, normal Lips and Perioral Area: None, normal Jaw: None, normal Tongue: None, normal,Extremity Movements Upper (arms, wrists, hands, fingers): None, normal Lower (legs, knees, ankles, toes): None, normal, Trunk Movements Neck, shoulders, hips: None, normal, Overall Severity Severity of abnormal movements (highest score from questions above): None, normal Incapacitation due to abnormal movements: None, normal Patient's awareness of abnormal movements (rate only patient's report): No Awareness, Dental Status Current problems with teeth and/or dentures?: No Does patient usually wear dentures?: No  CIWA:  CIWA-Ar Total: 0 COWS:  COWS Total Score: 0  Musculoskeletal: Strength & Muscle Tone: within normal limits Gait & Station: normal Patient leans: N/A  Psychiatric Specialty Exam: Physical Exam  Nursing note and vitals reviewed. Constitutional: He appears well-developed and well-nourished.  HENT:  Head: Normocephalic and atraumatic.  Eyes: Pupils are equal, round, and reactive to light. Conjunctivae are normal.  Neck: Normal range of motion.  Cardiovascular: Regular rhythm and normal heart sounds.  Respiratory: Effort normal.  GI: Soft.  Musculoskeletal: Normal range of motion.  Neurological: He is alert.  Skin: Skin is warm and dry.  Psychiatric: He has a normal mood and affect. His behavior is normal. Judgment and thought content normal.    Review of Systems  Constitutional: Negative.   HENT: Negative.   Eyes: Negative.   Respiratory: Negative.    Cardiovascular: Negative.   Gastrointestinal: Negative.   Musculoskeletal: Negative.   Skin: Negative.   Neurological: Negative.   Psychiatric/Behavioral: Negative.     Blood pressure 117/86, pulse 88, temperature 97.8 F (36.6 C), temperature source Oral, resp. rate 18, height _0  (1.803 m), weight 88.5 kg, SpO2 100 %.Body mass index is 27.2 kg/m.  General Appearance: Casual  Eye Contact:  Good  Speech:  Clear and Coherent  Volume:  Normal  Mood:  Euthymic  Affect:  Congruent  Thought Process:  Coherent  Orientation:  Full (Time, Place, and Person)  Thought Content:  Logical  Suicidal Thoughts:  No  Homicidal Thoughts:  No  Memory:  Immediate;   Fair Recent;   Fair Remote;   Fair  Judgement:  Fair  Insight:  Fair  Psychomotor Activity:  Decreased  Concentration:  Concentration: Fair  Recall:  [employer] of Knowledge:  Fair  Language:  Fair  Akathisia:  No  Handed:  Right  AIMS (if indicated):     Assets:  Desire for Improvement Physical Health  ADL's:  Intact  Cognition:  WNL  Sleep:  Number of Hours: 6.15     Treatment Plan Summary: Daily contact with patient to assess and evaluate symptoms and progress in treatment, Medication management and Plan Treatment team met today.  Patient no longer meets criteria for hospitalization and has only been here because of a lack of discharge opportunities.  We will be making arrangements to have a meeting with his guardian to discuss the need for discharge sooner rather than later.  If this cannot be done at the latest he will be here only till his commitment runs out at which time he will need to be released from the hospital.  [facility] that mother will be able to make some arrangements for disposition sooner than that. I am going to go ahead and check a Depakote level tomorrow morning as we have not done that in a little while Alethia Berthold, MD 12/22/2018, 3:54 PM

## 2018-12-23 LAB — VALPROIC ACID LEVEL: Valproic Acid Lvl: 59 ug/mL (ref 50.0–100.0)

## 2018-12-23 MED ORDER — DIVALPROEX SODIUM ER 500 MG PO TB24
1500.0000 mg | ORAL_TABLET | Freq: Every day | ORAL | Status: DC
  Administered 2018-12-23 – 2019-01-06 (×15): 1500 mg via ORAL
  Filled 2018-12-23 (×15): qty 3

## 2018-12-23 NOTE — Progress Notes (Signed)
D - Patient was in his room upon arrival to the unit. Patient was pleasant during assessment and medication administration. Patient denies SI/HI/AVH, pain, anxiety and depression with this Clinical research associate.Patient said to this Clinical research associate, "God talks [id number] me, he tells me what's up." I asked the patient what God tells him but the patient said he couldn't put it in words.   A - Patient was compliant with medication administration per MD orders and procedures on the unit. Patient given education. Patient given support and encouragement to be active in his treatment plan. Patient informed to let staff know if there are any issues or problems on the unit.   R - Patient being monitored Q 15 minutes for safety per unit protocol. Patient remains safe on the unit.

## 2018-12-23 NOTE — Plan of Care (Signed)
Patient is alert and oriented. Denies SI, HI and AVH. Patient is appropriate on the unit with peers and staff. Patient is eating, sleeping and taking medications. Problem: Education: Goal: Knowledge of [place] General Education information/materials will improve Outcome: Progressing   Problem: Coping: Goal: Ability to verbalize frustrations and anger appropriately will improve Outcome: Progressing Goal: Ability to demonstrate self-control will improve Outcome: Progressing   Problem: Safety: Goal: Periods of time without injury will increase Outcome: Progressing   Problem: Education: Goal: Will be free of psychotic symptoms Outcome: Progressing Goal: Knowledge of the prescribed therapeutic regimen will improve Outcome: Progressing

## 2018-12-23 NOTE — BHH Counselor (Signed)
CSW spoke with mother who reports that Tasia Catchings at [employer] would like to set up a video conference with the patient at some point next week.  CSW provided mother update on group with Scarlette Calico [place],[phone].  CSW encouraged mother to follow up with group home with any questions.    Penni Homans, MSW, LCSW 12/23/2018 4:32 PM

## 2018-12-23 NOTE — BHH Counselor (Signed)
CSW, patient's mother and Dr. Toni Amend, psychiatrist on the unit completed a phone conference to discuss the patient's care plan.  CSW, prior to the beginning of the meeting went to see if [facility] Director was available to join as previously discussed.  CSW found the offices to be empty and returned for the phone conference.  CSW did follow up with [place] following the meeting and Otto Herb reports that she was not made aware of the time. CSW did update Otto Herb of the meeting.   Dr. Toni Amend discussed with mother that current plan is for the patient to remain on the unit as CSW continues to look for placement at a group home. CSW discussed with the mother that the group home pool is down to 3.  Guardian's husband asked if [place] entering into Phase 2 of the Covid-19 plan would impact or change group homes decisions to decline patient.  CSW expressed that she is not sure, however, from current conversation with group homes who decline patient due to Covid concerns despite the patient's negative results, it appears the answer will be no.    Mother was open to expanding to [facility] for potential group homes.    Dr. Toni Amend spoke with mother about pt returning to her home and mother reported "that's impossible".  Mother reports concerns for her health and cited past conflictual relationships with patient when they have attempted that in the past.  Mother and Dr. Toni Amend discussed mother potentially getting the patient a small apartment should group homes not be successful.    Dr. Toni Amend did point out to the mother that there will be a date in June where the patient will have to be discharged as he will not renew commitment and mother will have to pick up the patient or develop an alternative plan.  Mother was receptive.  Penni Homans, MSW, LCSW 12/23/2018 9:00 AM

## 2018-12-23 NOTE — Progress Notes (Signed)
[facility]  12/23/2018 3:39 PM Jackson [name]  MRN:  [id number] Subjective: Follow-up for patient with schizoaffective disorder.  Patient himself has no new complaints.  Behaving appropriately on the unit.  I checked a Depakote level this morning and it was around 60 which is probably a little too low.  This morning the social worker and I had a very nice conversation with the patient's mother where we tried to make it clear that our progress has been negligible in finding any discharge planning so far and that she should be thinking about other options such as finding him an apartment.  Mother was appropriate in the conversation but made it very clear with good examples why it would be unsafe and impossible for him to live with her. Principal Problem: Schizoaffective disorder, bipolar type (HCC) Diagnosis: Principal Problem:   Schizoaffective disorder, bipolar type (HCC) Active Problems:   Noncompliance  Total Time spent with patient: 20 minutes  Past Psychiatric History: Bipolar or schizoaffective disorder multiple previous hospitalization  Past Medical History:  Past Medical History:  Diagnosis Date  . Bipolar 1 disorder (HCC)   . Schizoaffective disorder (HCC)    History reviewed. No pertinent surgical history. Family History: History reviewed. No pertinent family history. Family Psychiatric  History: See previous Social History:  Social History   Substance and Sexual Activity  Alcohol Use Not on file     Social History   Substance and Sexual Activity  Drug Use Not on file    Social History   Socioeconomic History  . Marital status: Single    Spouse name: Not on file  . Number of children: Not on file  . Years of education: Not on file  . Highest education level: Not on file  Occupational History  . Not on file  Social Needs  . Financial resource strain: Not on file  . Food insecurity:    Worry: Not on file    Inability: Not on file  . Transportation  needs:    Medical: Not on file    Non-medical: Not on file  Tobacco Use  . Smoking status: Light Tobacco Smoker    Packs/day: 0.25    Types: Cigarettes  . Smokeless tobacco: Never Used  Substance and Sexual Activity  . Alcohol use: Not on file  . Drug use: Not on file  . Sexual activity: Not on file  Lifestyle  . Physical activity:    Days per week: Not on file    Minutes per session: Not on file  . Stress: Not on file  Relationships  . Social connections:    Talks on phone: Not on file    Gets together: Not on file    Attends religious service: Not on file    Active member of club or organization: Not on file    Attends meetings of clubs or organizations: Not on file    Relationship status: Not on file  Other Topics Concern  . Not on file  Social History Narrative  . Not on file   Additional Social History:                         Sleep: Fair  Appetite:  Fair  Current Medications: Current Facility-Administered Medications  Medication Dose Route Frequency Provider Last Rate Last Dose  . acetaminophen (TYLENOL) tablet 650 mg  650 mg Oral Q6H PRN Terance HartStephens, [name], MD   650 mg at 10/26/18 2302  . alum &  mag hydroxide-simeth (MAALOX/MYLANTA) 200-200-20 MG/5ML suspension 30 mL  30 mL Oral Q4H PRN [name], MD   30 mL at 10/26/18 2129  . ARIPiprazole ER (ABILIFY MAINTENA) injection 400 mg  400 mg Intramuscular Q28 days Charm Rings, NP   400 mg at 12/08/18 2217  . divalproex (DEPAKOTE ER) 24 hr tablet 1,500 mg  1,500 mg Oral QHS [name], [name], MD      . hydrocerin (EUCERIN) cream   Topical BID [name], [name], MD   1 application at 12/23/18 [phone]  . hydrOXYzine (ATARAX/VISTARIL) tablet 50 mg  50 mg Oral Q4H PRN [name], MD   50 mg at 10/28/18 7482  . Influenza vac split quadrivalent PF (FLUARIX) injection 0.5 mL  0.5 mL Intramuscular Once Charm Rings, NP      . magnesium hydroxide (MILK OF MAGNESIA) suspension 30 mL  30 mL Oral  Daily PRN [name], MD      . OLANZapine [facility]) injection 10 mg  10 mg Intramuscular Q6H PRN [name], [name], MD      . OLANZapine (ZYPREXA) tablet 30 mg  30 mg Oral QHS Charm Rings, NP   30 mg at 12/22/18 2222  . traZODone (DESYREL) tablet 100 mg  100 mg Oral QHS PRN [name], MD   100 mg at 11/26/18 2123    Lab Results:  Results for orders placed or performed during the hospital encounter of 10/17/18 (from the past 48 hour(s))  Valproic acid level     Status: None   Collection Time: 12/23/18  6:59 AM  Result Value Ref Range   Valproic Acid Lvl 59 50.0 - 100.0 ug/mL    Comment: Performed at [facility], [address] [place], [place] [address]    Blood Alcohol level:  Lab Results  Component Value Date   [facility] <10 10/16/2018    Metabolic Disorder Labs: Lab Results  Component Value Date   HGBA1C 5.2 10/23/2018   MPG 102.54 10/23/2018   No results found for: PROLACTIN Lab Results  Component Value Date   CHOL 129 10/23/2018   TRIG 58 10/23/2018   HDL 50 10/23/2018   CHOLHDL 2.6 10/23/2018   VLDL 12 10/23/2018   LDLCALC 67 10/23/2018    Physical Findings: AIMS: Facial and Oral Movements Muscles of Facial Expression: None, normal Lips and Perioral Area: None, normal Jaw: None, normal Tongue: None, normal,Extremity Movements Upper (arms, wrists, hands, fingers): None, normal Lower (legs, knees, ankles, toes): None, normal, Trunk Movements Neck, shoulders, hips: None, normal, Overall Severity Severity of abnormal movements (highest score from questions above): None, normal Incapacitation due to abnormal movements: None, normal Patient's awareness of abnormal movements (rate only patient's report): No Awareness, Dental Status Current problems with teeth and/or dentures?: No Does patient usually wear dentures?: No  CIWA:  CIWA-Ar Total: 0 COWS:  COWS Total Score: 0  Musculoskeletal: Strength & Muscle Tone: within normal  limits Gait & Station: normal Patient leans: N/A  Psychiatric Specialty Exam: Physical Exam  Nursing note and vitals reviewed. Constitutional: He appears well-developed and well-nourished.  HENT:  Head: Normocephalic and atraumatic.  Eyes: Pupils are equal, round, and reactive to light. Conjunctivae are normal.  Neck: Normal range of motion.  Cardiovascular: Regular rhythm and normal heart sounds.  Respiratory: Effort normal.  GI: Soft.  Musculoskeletal: Normal range of motion.  Neurological: He is alert.  Skin: Skin is warm and dry.  Psychiatric: He has a normal mood and  affect. His behavior is normal. Judgment and thought content normal.    Review of Systems  Constitutional: Negative.   HENT: Negative.   Eyes: Negative.   Respiratory: Negative.   Cardiovascular: Negative.   Gastrointestinal: Negative.   Musculoskeletal: Negative.   Skin: Negative.   Neurological: Negative.   Psychiatric/Behavioral: Negative.     Blood pressure 121/82, pulse 73, temperature 97.7 F (36.5 C), temperature source Oral, resp. rate 18, height  (1.803 m), weight 88.5 kg, SpO2 99 %.Body mass index is 27.2 kg/m.  General Appearance: Casual  Eye Contact:  Good  Speech:  Clear and Coherent  Volume:  Normal  Mood:  Euthymic  Affect:  Constricted  Thought Process:  Coherent  Orientation:  Full (Time, Place, and Person)  Thought Content:  Logical  Suicidal Thoughts:  No  Homicidal Thoughts:  No  Memory:  Immediate;   Fair Recent;   Fair Remote;   Fair  Judgement:  Fair  Insight:  Fair  Psychomotor Activity:  Decreased  Concentration:  Concentration: Fair  Recall:  [employer] of Knowledge:  Fair  Language:  Fair  Akathisia:  No  Handed:  Right  AIMS (if indicated):     Assets:  Desire for Improvement Physical Health Social Support  ADL's:  Intact  Cognition:  WNL  Sleep:  Number of Hours: 4     Treatment Plan Summary: Daily contact with patient to assess and evaluate  symptoms and progress in treatment, Medication management and Plan Increased dose of Depakote tonight to 1500 because of lower blood level.  We are continuing to work on finding appropriate discharge options.  We have got his mother who is also his guardian informed of how bad progress has been so far and that she needs to be thinking about finding a separate apartment or some other kind of living arrangement because his commitment will run out at some point.  Nevertheless she makes it clear because of the current COVID situation as well as his past history of aggression to her that it is unsafe for him to actually live with her.  We are continuing to work on the whole placement situation for this patient  Jackson Rasmussen, MD 12/23/2018, 3:39 PM

## 2018-12-23 NOTE — Plan of Care (Signed)
Patient stated, "God talks to me, he lets me know what's going on." When assessed about what God tells him the patient couldn't tell this Clinical research associate specifics.   Problem: Education: Goal: Will be free of psychotic symptoms Outcome: Not Progressing

## 2018-12-23 NOTE — Progress Notes (Signed)
Recreation Therapy Notes  Date: 12/23/2018   Time: 9:30 am   Location: Craft room   Behavioral response: N/A   Intervention Topic: Decision Making  Discussion/Intervention: Patient did not attend group.   Clinical Observations/Feedback:  Patient did not attend group.   Eh Sauseda LRT/CTRS          Aima Mcwhirt 12/23/2018 10:33 AM

## 2018-12-23 NOTE — BHH Counselor (Signed)
CSW spoke with patient as he was meeting with the chaplain.    CSW assessed if there was any support needed.   Patient reports plans to remain on the unit until he has obtained his guardianship in June.  Patient is under the impression that he will get a guardian at litem and his guardianship back on June 1.  CSW and chaplain both tried to explain to the patient that this was unlikely.    CSW updated that [facility] has removed from the waiting list.  CSW also updated that there are three group homes at this moment that are reviewing the patient's chart.  Patient reports plans to sabotage any interviews associated with this.  CSW advised against this.    Patient reports that he will also get own housing and his funds in June as well.  CSW has discussed several times with the patient that is unlikely and mother remains the guardian. Patient remains hopeful.  Penni Homans, MSW, LCSW 12/23/2018 3:00 PM

## 2018-12-23 NOTE — Progress Notes (Signed)
Pastoral Care Visit   12/23/18 1500  Clinical Encounter Type  Visited With Patient  Visit Type Follow-up;Psychological support;Behavioral Health  Referral From Patient  Consult/Referral To Chaplain  Spiritual Encounters  Spiritual Needs Emotional   Pt asked chap to come visit with him.  Pt was in good mood, smiling, and sharing his music.  When SW came in, conversation shifted to discussion regarding group home options.  Pt indicated to this chap that he intends to thwart discharge until he has completed his emancipation.  Mirna Mires discussed that emancipation will take much longer. SW concurred.  Pt conversation continued after SW departed and pt was clearly agitated. Pt began attempting to get this chap to engage in various supernatural activities using which chap indicated he was not able to see/discern the things pt wanted him to see.  Mirna Mires shared with pt that these types of behaviors could work against him convincing a judge of his ability to self sufficient. Pt expressed understanding and then went into long explanation of what is wrong in the world using the using various historic/religious/political entities.    Milinda Antis, [address]

## 2018-12-23 NOTE — Plan of Care (Signed)
  Problem: Education: Goal: Will be free of psychotic symptoms Outcome: Progressing  Patient appears free of psychosis.  

## 2018-12-23 NOTE — Progress Notes (Signed)
Patient alert and oriented x 4, affect is blunted, mood is labile, he denies SI/HI/AVH,  makes appropriate eye contact and interacts appropriately with peers and staff, compliant with prescribed medication regimen, emotional support and encouragement offered, will continue to monitor

## 2018-12-23 NOTE — BHH Counselor (Signed)
CSW contacted the following group homes in [place] at [employer] request.  CSW was only able to identify five group homes in the county.  [facility], [phone] CSW was informed that she would need to call "the main office in [place] at [phone]".    [address] [phone] CSW was informed that she needed to call the main number, when asking for the main number the staff reported that they don't have access to and it is located online.   [address], [phone] Voicemail is not activated.  [facility], [phone] Not a group home but a residential center.   [facility], [phone] Still waiting on a license to open, however, should have it soon.    Following group homes are not in [place] but needed follow up as well.   [facility], [phone] Phone was picked up though no one answered.  CSW called back and left HIPAA compliant voicemail.   [facility], [phone] AND [facility], [phone] CSWcalled [phone], owner has not responded at this time.  He is not sure what the hold-up is and reports no additional information is needed.   [facility], [phone] Still waiting on meeting with QP.  QP is supposed to call at 3:30pm, she asked for CSW to call later.   Penni Homans, MSW, LCSW 12/23/2018 2:10 PM

## 2018-12-23 NOTE — BHH Group Notes (Signed)
LCSW Group Therapy Note  12/23/2018 11:53 AM  Type of Therapy/Topic:  Group Therapy:  Balance in Life  Participation Level:  Did Not Attend  Description of Group:    This group will address the concept of balance and how it feels and looks when one is unbalanced. Patients will be encouraged to process areas in their lives that are out of balance and identify reasons for remaining unbalanced. Facilitators will guide patients in utilizing problem-solving interventions to address and correct the stressor making their life unbalanced. Understanding and applying boundaries will be explored and addressed for obtaining and maintaining a balanced life. Patients will be encouraged to explore ways to assertively make their unbalanced needs known to significant others in their lives, using other group members and facilitator for support and feedback.  Therapeutic Goals: 1. Patient will identify two or more emotions or situations they have that consume much of in their lives. 2. Patient will identify signs/triggers that life has become out of balance:  3. Patient will identify two ways to set boundaries in order to achieve balance in their lives:  4. Patient will demonstrate ability to communicate their needs through discussion and/or role plays  Summary of Patient Progress:  x    Therapeutic Modalities:   Cognitive Behavioral Therapy Solution-Focused Therapy Assertiveness Training  Iris Pert, MSW, LCSW Clinical Social Work 12/23/2018 11:53 AM

## 2018-12-24 NOTE — Plan of Care (Signed)
Patient denies SI,HI and AVH.Appropriate with staff & peers.Appetite and energy level good.No issues verbalized.Support and encouragement given.

## 2018-12-24 NOTE — Plan of Care (Signed)
  Problem: Education: Goal: Will be free of psychotic symptoms Outcome: Progressing  Patient appears free of psychosis symptoms.

## 2018-12-24 NOTE — Progress Notes (Signed)
[facility] MD Progress Note  12/24/2018 4:34 PM Jackson [name]  MRN:  [id number] Subjective: Follow-up for this patient with schizoaffective disorder.  Patient has no new complaints.  I had increased his Depakote level because of a low blood level.  No side effects or problems from that.  Patient remains calm lucid and able to interact appropriately.  We did have a family meeting on the telephone yesterday.  No concrete progress but we are still working to find some kind of disposition Principal Problem: Schizoaffective disorder, bipolar type (HCC) Diagnosis: Principal Problem:   Schizoaffective disorder, bipolar type (HCC) Active Problems:   Noncompliance  Total Time spent with patient: 20 minutes  Past Psychiatric History: History of schizoaffective disorder several hospitalizations including lengthy ones  Past Medical History:  Past Medical History:  Diagnosis Date  . Bipolar 1 disorder (HCC)   . Schizoaffective disorder (HCC)    History reviewed. No pertinent surgical history. Family History: History reviewed. No pertinent family history. Family Psychiatric  History: See previous Social History:  Social History   Substance and Sexual Activity  Alcohol Use Not on file     Social History   Substance and Sexual Activity  Drug Use Not on file    Social History   Socioeconomic History  . Marital status: Single    Spouse name: Not on file  . Number of children: Not on file  . Years of education: Not on file  . Highest education level: Not on file  Occupational History  . Not on file  Social Needs  . Financial resource strain: Not on file  . Food insecurity:    Worry: Not on file    Inability: Not on file  . Transportation needs:    Medical: Not on file    Non-medical: Not on file  Tobacco Use  . Smoking status: Light Tobacco Smoker    Packs/day: 0.25    Types: Cigarettes  . Smokeless tobacco: Never Used  Substance and Sexual Activity  . Alcohol use: Not on file  .  Drug use: Not on file  . Sexual activity: Not on file  Lifestyle  . Physical activity:    Days per week: Not on file    Minutes per session: Not on file  . Stress: Not on file  Relationships  . Social connections:    Talks on phone: Not on file    Gets together: Not on file    Attends religious service: Not on file    Active member of club or organization: Not on file    Attends meetings of clubs or organizations: Not on file    Relationship status: Not on file  Other Topics Concern  . Not on file  Social History Narrative  . Not on file   Additional Social History:                         Sleep: Fair  Appetite:  Fair  Current Medications: Current Facility-Administered Medications  Medication Dose Route Frequency Provider Last Rate Last Dose  . acetaminophen (TYLENOL) tablet 650 mg  650 mg Oral Q6H PRN [name], MD   650 mg at 10/26/18 2302  . alum & mag hydroxide-simeth (MAALOX/MYLANTA) 200-200-20 MG/5ML suspension 30 mL  30 mL Oral Q4H PRN [name], MD   30 mL at 10/26/18 2129  . ARIPiprazole ER (ABILIFY MAINTENA) injection 400 mg  400 mg Intramuscular Q28 days Charm Rings, NP  400 mg at 12/08/18 2217  . divalproex (DEPAKOTE ER) 24 hr tablet 1,500 mg  1,500 mg Oral QHS Wyett Narine T, MD   1,500 mg at 12/23/18 2300  . hydrocerin (EUCERIN) cream   Topical BID Sora Vrooman T, MD      . hydrOXYzine (ATARAX/VISTARIL) tablet 50 mg  50 mg Oral Q4H PRN [name], MD   50 mg at 10/28/18 8110  . Influenza vac split quadrivalent PF (FLUARIX) injection 0.5 mL  0.5 mL Intramuscular Once Charm Rings, NP      . magnesium hydroxide (MILK OF MAGNESIA) suspension 30 mL  30 mL Oral Daily PRN [name], MD      . OLANZapine [facility]) injection 10 mg  10 mg Intramuscular Q6H PRN Angie Hogg T, MD      . OLANZapine (ZYPREXA) tablet 30 mg  30 mg Oral QHS Charm Rings, NP   30 mg at 12/23/18 2300  . traZODone (DESYREL) tablet 100  mg  100 mg Oral QHS PRN [name], MD   100 mg at 11/26/18 2123    Lab Results:  Results for orders placed or performed during the hospital encounter of 10/17/18 (from the past 48 hour(s))  Valproic acid level     Status: None   Collection Time: 12/23/18  6:59 AM  Result Value Ref Range   Valproic Acid Lvl 59 50.0 - 100.0 ug/mL    Comment: Performed at [facility], [address] [place], [place] [address]    Blood Alcohol level:  Lab Results  Component Value Date   [facility] <10 10/16/2018    Metabolic Disorder Labs: Lab Results  Component Value Date   HGBA1C 5.2 10/23/2018   MPG 102.54 10/23/2018   No results found for: PROLACTIN Lab Results  Component Value Date   CHOL 129 10/23/2018   TRIG 58 10/23/2018   HDL 50 10/23/2018   CHOLHDL 2.6 10/23/2018   VLDL 12 10/23/2018   LDLCALC 67 10/23/2018    Physical Findings: AIMS: Facial and Oral Movements Muscles of Facial Expression: None, normal Lips and Perioral Area: None, normal Jaw: None, normal Tongue: None, normal,Extremity Movements Upper (arms, wrists, hands, fingers): None, normal Lower (legs, knees, ankles, toes): None, normal, Trunk Movements Neck, shoulders, hips: None, normal, Overall Severity Severity of abnormal movements (highest score from questions above): None, normal Incapacitation due to abnormal movements: None, normal Patient's awareness of abnormal movements (rate only patient's report): No Awareness, Dental Status Current problems with teeth and/or dentures?: No Does patient usually wear dentures?: No  CIWA:  CIWA-Ar Total: 0 COWS:  COWS Total Score: 0  Musculoskeletal: Strength & Muscle Tone: within normal limits Gait & Station: normal Patient leans: N/A  Psychiatric Specialty Exam: Physical Exam  Nursing note and vitals reviewed. Constitutional: He appears well-developed and well-nourished.  HENT:  Head: Normocephalic and atraumatic.  Eyes: Pupils are equal, round,  and reactive to light. Conjunctivae are normal.  Neck: Normal range of motion.  Cardiovascular: Regular rhythm and normal heart sounds.  Respiratory: Effort normal.  GI: Soft.  Musculoskeletal: Normal range of motion.  Neurological: He is alert.  Skin: Skin is warm and dry.  Psychiatric: He has a normal mood and affect. His speech is normal and behavior is normal. Judgment and thought content normal. Cognition and memory are normal.    Review of Systems  Constitutional: Negative.   HENT: Negative.   Eyes: Negative.   Respiratory: Negative.   Cardiovascular: Negative.  Gastrointestinal: Negative.   Musculoskeletal: Negative.   Skin: Negative.   Neurological: Negative.   Psychiatric/Behavioral: Negative.     Blood pressure (!) 103/58, pulse 73, temperature 97.7 F (36.5 C), temperature source Oral, resp. rate 18, height 5\' 11"  (1.803 m), weight 88.5 kg, SpO2 99 %.Body mass index is 27.2 kg/m.  General Appearance: Casual  Eye Contact:  Fair  Speech:  Clear and Coherent  Volume:  Normal  Mood:  Euthymic  Affect:  Congruent  Thought Process:  Goal Directed  Orientation:  Full (Time, Place, and Person)  Thought Content:  Logical  Suicidal Thoughts:  No  Homicidal Thoughts:  No  Memory:  Immediate;   Fair Recent;   Fair Remote;   Fair  Judgement:  Good  Insight:  Fair  Psychomotor Activity:  Normal  Concentration:  Concentration: Fair  Recall:  [employer]Fair  Fund of Knowledge:  Fair  Language:  Fair  Akathisia:  No  Handed:  Right  AIMS (if indicated):     Assets:  Desire for Improvement Physical Health  ADL's:  Intact  Cognition:  WNL  Sleep:  Number of Hours: 4     Treatment Plan Summary: Daily contact with patient to assess and evaluate symptoms and progress in treatment, Medication management and Plan No further change to medication.  He is tolerating medicine without complaint.  Patient is stable.  We are still seeking a discharge plan.  Mordecai RasmussenJohn Lou Irigoyen, MD 12/24/2018,  4:34 PM

## 2018-12-24 NOTE — Progress Notes (Signed)
Recreation Therapy Notes          Jackson [name] 12/24/2018 11:38 AM 

## 2018-12-24 NOTE — [facility] Group Notes (Signed)
Feelings Around Relapse 12/24/2018 1PM  Type of Therapy and Topic:  Group Therapy:  Feelings around Relapse and Recovery  Participation Level:  Did Not Attend   Description of Group:    Patients in this group will discuss emotions they experience before and after a relapse. They will process how experiencing these feelings, or avoidance of experiencing them, relates to having a relapse. Facilitator will guide patients to explore emotions they have related to recovery. Patients will be encouraged to process which emotions are more powerful. They will be guided to discuss the emotional reaction significant others in their lives may have to patients' relapse or recovery. Patients will be assisted in exploring ways to respond to the emotions of others without this contributing to a relapse.  Therapeutic Goals: 1. Patient will identify two or more emotions that lead to a relapse for them 2. Patient will identify two emotions that result when they relapse 3. Patient will identify two emotions related to recovery 4. Patient will demonstrate ability to communicate their needs through discussion and/or role plays   Summary of Patient Progress:     Therapeutic Modalities:   Cognitive Behavioral Therapy Solution-Focused Therapy Assertiveness Training Relapse Prevention Therapy   Suzan Slick, LCSW 12/24/2018 1:54 PM

## 2018-12-25 DIAGNOSIS — F250 Schizoaffective disorder, bipolar type: Secondary | ICD-10-CM

## 2018-12-25 NOTE — Progress Notes (Signed)
Patient alert and oriented x 4, denies SI/HI/AVH no disruption or argumentative behavior with staffduring this shift,affect is blunted, hews assertive, makes appropriate eye contact with staff and heisinteractingwith staff and peersappropriately. Patient's thoughts are organized and coherent no bizarre behavior and he is receptive to staff. Patient was complaint with medication. 15 minutes safety checks maintained will continue to monitor.

## 2018-12-25 NOTE — Progress Notes (Signed)
[facility]  12/25/2018 10:48 AM Jackson [name]  MRN:  [id number] Subjective: Patient's chart reviewed and discussed with treatment team.  Follow-up for this patient with schizoaffective disorder.  Patient has no new complaints.  Patient remains calm lucid and able to interact appropriately.  He continues to tolerate the increase in Depakote well.  Primary team is working with his family to to figure out an ideal disposition.  Principal Problem: Schizoaffective disorder, bipolar type (HCC) Diagnosis: Principal Problem:   Schizoaffective disorder, bipolar type (HCC) Active Problems:   Noncompliance  Total Time spent with patient: 20 minutes  Past Psychiatric History: History of schizoaffective disorder several hospitalizations including lengthy ones  Past Medical History:  Past Medical History:  Diagnosis Date  . Bipolar 1 disorder (HCC)   . Schizoaffective disorder (HCC)    History reviewed. No pertinent surgical history. Family History: History reviewed. No pertinent family history. Family Psychiatric  History: See previous Social History:  Social History   Substance and Sexual Activity  Alcohol Use Not on file     Social History   Substance and Sexual Activity  Drug Use Not on file    Social History   Socioeconomic History  . Marital status: Single    Spouse name: Not on file  . Number of children: Not on file  . Years of education: Not on file  . Highest education level: Not on file  Occupational History  . Not on file  Social Needs  . Financial resource strain: Not on file  . Food insecurity:    Worry: Not on file    Inability: Not on file  . Transportation needs:    Medical: Not on file    Non-medical: Not on file  Tobacco Use  . Smoking status: Light Tobacco Smoker    Packs/day: 0.25    Types: Cigarettes  . Smokeless tobacco: Never Used  Substance and Sexual Activity  . Alcohol use: Not on file  . Drug use: Not on file  . Sexual activity:  Not on file  Lifestyle  . Physical activity:    Days per week: Not on file    Minutes per session: Not on file  . Stress: Not on file  Relationships  . Social connections:    Talks on phone: Not on file    Gets together: Not on file    Attends religious service: Not on file    Active member of club or organization: Not on file    Attends meetings of clubs or organizations: Not on file    Relationship status: Not on file  Other Topics Concern  . Not on file  Social History Narrative  . Not on file   Additional Social History:                         Sleep: Fair  Appetite:  Fair  Current Medications: Current Facility-Administered Medications  Medication Dose Route Frequency Provider Last Rate Last Dose  . acetaminophen (TYLENOL) tablet 650 mg  650 mg Oral Q6H PRN Terance HartStephens, [name], MD   650 mg at 10/26/18 2302  . alum & mag hydroxide-simeth (MAALOX/MYLANTA) 200-200-20 MG/5ML suspension 30 mL  30 mL Oral Q4H PRN Terance HartStephens, [name], MD   30 mL at 10/26/18 2129  . ARIPiprazole ER (ABILIFY MAINTENA) injection 400 mg  400 mg Intramuscular Q28 days Charm RingsLord, Jamison Y, NP   400 mg at 12/08/18 2217  . divalproex (DEPAKOTE ER) 24 hr  tablet 1,500 mg  1,500 mg Oral QHS [name], [name], MD   1,500 mg at 12/24/18 2155  . hydrocerin (EUCERIN) cream   Topical BID [name], [name], MD      . hydrOXYzine (ATARAX/VISTARIL) tablet 50 mg  50 mg Oral Q4H PRN [name], MD   50 mg at 10/28/18 4034  . Influenza vac split quadrivalent PF (FLUARIX) injection 0.5 mL  0.5 mL Intramuscular Once Charm Rings, NP      . magnesium hydroxide (MILK OF MAGNESIA) suspension 30 mL  30 mL Oral Daily PRN [name], MD      . OLANZapine [facility]) injection 10 mg  10 mg Intramuscular Q6H PRN [name], [name], MD      . OLANZapine (ZYPREXA) tablet 30 mg  30 mg Oral QHS Charm Rings, NP   30 mg at 12/24/18 2155  . traZODone (DESYREL) tablet 100 mg  100 mg Oral QHS PRN [name], MD   100 mg at 11/26/18 2123    Lab Results:  No results found for this or any previous visit (from the past 48 hour(s)).  Blood Alcohol level:  Lab Results  Component Value Date   ETH <10 10/16/2018    Metabolic Disorder Labs: Lab Results  Component Value Date   HGBA1C 5.2 10/23/2018   MPG 102.54 10/23/2018   No results found for: PROLACTIN Lab Results  Component Value Date   CHOL 129 10/23/2018   TRIG 58 10/23/2018   HDL 50 10/23/2018   CHOLHDL 2.6 10/23/2018   VLDL 12 10/23/2018   LDLCALC 67 10/23/2018    Physical Findings: AIMS: Facial and Oral Movements Muscles of Facial Expression: None, normal Lips and Perioral Area: None, normal Jaw: None, normal Tongue: None, normal,Extremity Movements Upper (arms, wrists, hands, fingers): None, normal Lower (legs, knees, ankles, toes): None, normal, Trunk Movements Neck, shoulders, hips: None, normal, Overall Severity Severity of abnormal movements (highest score from questions above): None, normal Incapacitation due to abnormal movements: None, normal Patient's awareness of abnormal movements (rate only patient's report): No Awareness, Dental Status Current problems with teeth and/or dentures?: No Does patient usually wear dentures?: No  CIWA:  CIWA-Ar Total: 0 COWS:  COWS Total Score: 0  Musculoskeletal: Strength & Muscle Tone: within normal limits Gait & Station: normal Patient leans: N/A  Psychiatric Specialty Exam: Physical Exam  Nursing note and vitals reviewed. Constitutional: He appears well-developed and well-nourished.  HENT:  Head: Normocephalic and atraumatic.  Eyes: Pupils are equal, round, and reactive to light. Conjunctivae are normal.  Neck: Normal range of motion.  Cardiovascular: Regular rhythm and normal heart sounds.  Respiratory: Effort normal.  GI: Soft.  Musculoskeletal: Normal range of motion.  Neurological: He is alert.  Skin: Skin is warm and dry.  Psychiatric: He has a normal  mood and affect. His speech is normal and behavior is normal. Judgment and thought content normal. Cognition and memory are normal.    Review of Systems  Constitutional: Negative.   HENT: Negative.   Eyes: Negative.   Respiratory: Negative.   Cardiovascular: Negative.   Gastrointestinal: Negative.   Musculoskeletal: Negative.   Skin: Negative.   Neurological: Negative.   Psychiatric/Behavioral: Negative.     Blood pressure 111/76, pulse 74, temperature (!) 97.4 F (36.3 C), temperature source Oral, resp. rate 18, height  (1.803 m), weight 88.5 kg, SpO2 98 %.Body mass index is 27.2 kg/m.  General Appearance: Casual  Eye Contact:  Fair  Speech:  Clear and Coherent  Volume:  Normal  Mood:  Euthymic  Affect:  Congruent  Thought Process:  Goal Directed  Orientation:  Full (Time, Place, and Person)  Thought Content:  Logical  Suicidal Thoughts:  No  Homicidal Thoughts:  No  Memory:  Immediate;   Fair Recent;   Fair Remote;   Fair  Judgement:  Good  Insight:  Fair  Psychomotor Activity:  Normal  Concentration:  Concentration: Fair  Recall:  [employer] of Knowledge:  Fair  Language:  Fair  Akathisia:  No  Handed:  Right  AIMS (if indicated):     Assets:  Desire for Improvement Physical Health  ADL's:  Intact  Cognition:  WNL  Sleep:  Number of Hours: 5.75     Treatment Plan Summary: Daily contact with patient to assess and evaluate symptoms and progress in treatment, Medication management and Plan No further change to medication.  He is tolerating medicine without complaint.  Patient is stable.  We are still seeking a discharge plan.  Patrick North, MD 12/25/2018, 10:48 AM

## 2018-12-25 NOTE — Plan of Care (Signed)
Patient is appropriate in the unit.No issues verbalized. 

## 2018-12-25 NOTE — [facility] Group Notes (Signed)
LCSW Group Therapy Note   12/25/2018 1:15pm   Type of Therapy and Topic:  Group Therapy:  Trust and Honesty  Participation Level:  Did Not Attend  Description of Group:    In this group patients will be asked to explore the value of being honest.  Patients will be guided to discuss their thoughts, feelings, and behaviors related to honesty and trusting in others. Patients will process together how trust and honesty relate to forming relationships with peers, family members, and self. Each patient will be challenged to identify and express feelings of being vulnerable. Patients will discuss reasons why people are dishonest and identify alternative outcomes if one was truthful (to self or others). This group will be process-oriented, with patients participating in exploration of their own experiences, giving and receiving support, and processing challenge from other group members.   Therapeutic Goals: 1. Patient will identify why honesty is important to relationships and how honesty overall affects relationships.  2. Patient will identify a situation where they lied or were lied too and the  feelings, thought process, and behaviors surrounding the situation 3. Patient will identify the meaning of being vulnerable, how that feels, and how that correlates to being honest with self and others. 4. Patient will identify situations where they could have told the truth, but instead lied and explain reasons of dishonesty.   Summary of Patient Progress Pt was invited to attend group but chose not to attend. CSW will continue to encourage pt to attend group throughout their admission.     Therapeutic Modalities:   Cognitive Behavioral Therapy Solution Focused Therapy Motivational Interviewing Brief Therapy  [name]  [name], LCSW 12/25/2018 12:40 PM

## 2018-12-26 DIAGNOSIS — F250 Schizoaffective disorder, bipolar type: Secondary | ICD-10-CM

## 2018-12-26 NOTE — [facility] Group Notes (Signed)
LCSW Group Therapy Note 12/26/2018 1:15pm  Type of Therapy and Topic: Group Therapy: Feelings Around Returning Home & Establishing a Supportive Framework and Supporting Oneself When Supports Not Available  Participation Level: Did Not Attend  Description of Group:  Patients first processed thoughts and feelings about upcoming discharge. These included fears of upcoming changes, lack of change, new living environments, judgements and expectations from others and overall stigma of mental health issues. The group then discussed the definition of a supportive framework, what that looks and feels like, and how do to discern it from an unhealthy non-supportive network. The group identified different types of supports as well as what to do when your family/friends are less than helpful or unavailable  Therapeutic Goals  1. Patient will identify one healthy supportive network that they can use at discharge. 2. Patient will identify one factor of a supportive framework and how to tell it from an unhealthy network. 3. Patient able to identify one coping skill to use when they do not have positive supports from others. 4. Patient will demonstrate ability to communicate their needs through discussion and/or role plays.  Summary of Patient Progress:  Pt was invited to attend group but chose not to attend. CSW will continue to encourage pt to attend group throughout their admission.   Therapeutic Modalities Cognitive Behavioral Therapy Motivational Interviewing   Jackson [name]  [name], LCSW 12/26/2018 12:04 PM

## 2018-12-26 NOTE — Progress Notes (Signed)
Patient was visible in the milieu until bedtime. Played cards with peers. Able to express his feelings and needs appropriatPleasant and cooperative. Stayed in the dayroom with peers, supportive to others. Able to express needs and concerns appropriately. Received bedtime medications and had a snack. Did not have any issues throughout the night. Has been sleeping and staff continue to monitor for safety and security.

## 2018-12-26 NOTE — Progress Notes (Signed)
[facility] Progress Note  12/26/2018 11:10 AM Jackson [name]  MRN:  [id number] Subjective: Patient's chart reviewed and discussed with treatment team.  Follow-up for this patient with schizoaffective disorder.  Patient has no new complaints.  Patient remains calm, polite with this clinician.     Primary team is working with his family to to figure out an ideal disposition.  Principal Problem: Schizoaffective disorder, bipolar type (HCC) Diagnosis: Principal Problem:   Schizoaffective disorder, bipolar type (HCC) Active Problems:   Noncompliance  Total Time spent with patient: 20 minutes  Past Psychiatric History: History of schizoaffective disorder several hospitalizations including lengthy ones  Past Medical History:  Past Medical History:  Diagnosis Date  . Bipolar 1 disorder (HCC)   . Schizoaffective disorder (HCC)    History reviewed. No pertinent surgical history. Family History: History reviewed. No pertinent family history. Family Psychiatric  History: See previous Social History:  Social History   Substance and Sexual Activity  Alcohol Use Not on file     Social History   Substance and Sexual Activity  Drug Use Not on file    Social History   Socioeconomic History  . Marital status: Single    Spouse name: Not on file  . Number of children: Not on file  . Years of education: Not on file  . Highest education level: Not on file  Occupational History  . Not on file  Social Needs  . Financial resource strain: Not on file  . Food insecurity:    Worry: Not on file    Inability: Not on file  . Transportation needs:    Medical: Not on file    Non-medical: Not on file  Tobacco Use  . Smoking status: Light Tobacco Smoker    Packs/day: 0.25    Types: Cigarettes  . Smokeless tobacco: Never Used  Substance and Sexual Activity  . Alcohol use: Not on file  . Drug use: Not on file  . Sexual activity: Not on file  Lifestyle  . Physical activity:    Days per week:  Not on file    Minutes per session: Not on file  . Stress: Not on file  Relationships  . Social connections:    Talks on phone: Not on file    Gets together: Not on file    Attends religious service: Not on file    Active member of club or organization: Not on file    Attends meetings of clubs or organizations: Not on file    Relationship status: Not on file  Other Topics Concern  . Not on file  Social History Narrative  . Not on file   Additional Social History:                         Sleep: Fair  Appetite:  Fair  Current Medications: Current Facility-Administered Medications  Medication Dose Route Frequency Provider Last Rate Last Dose  . acetaminophen (TYLENOL) tablet 650 mg  650 mg Oral Q6H PRN Terance HartStephens, [name], MD   650 mg at 10/26/18 2302  . alum & mag hydroxide-simeth (MAALOX/MYLANTA) 200-200-20 MG/5ML suspension 30 mL  30 mL Oral Q4H PRN Terance HartStephens, [name], MD   30 mL at 10/26/18 2129  . ARIPiprazole ER (ABILIFY MAINTENA) injection 400 mg  400 mg Intramuscular Q28 days Charm RingsLord, Jamison Y, NP   400 mg at 12/08/18 2217  . divalproex (DEPAKOTE ER) 24 hr tablet 1,500 mg  1,500 mg Oral QHS [name],  [name], MD   1,500 mg at 12/25/18 2216  . hydrocerin (EUCERIN) cream   Topical BID [name], [name], MD      . hydrOXYzine (ATARAX/VISTARIL) tablet 50 mg  50 mg Oral Q4H PRN [name], MD   50 mg at 10/28/18 1324  . Influenza vac split quadrivalent PF (FLUARIX) injection 0.5 mL  0.5 mL Intramuscular Once Charm Rings, NP      . magnesium hydroxide (MILK OF MAGNESIA) suspension 30 mL  30 mL Oral Daily PRN [name], MD      . OLANZapine [facility]) injection 10 mg  10 mg Intramuscular Q6H PRN [name], [name], MD      . OLANZapine (ZYPREXA) tablet 30 mg  30 mg Oral QHS Charm Rings, NP   30 mg at 12/25/18 2216  . traZODone (DESYREL) tablet 100 mg  100 mg Oral QHS PRN [name], MD   100 mg at 11/26/18 2123    Lab Results:  No results  found for this or any previous visit (from the past 48 hour(s)).  Blood Alcohol level:  Lab Results  Component Value Date   ETH <10 10/16/2018    Metabolic Disorder Labs: Lab Results  Component Value Date   HGBA1C 5.2 10/23/2018   MPG 102.54 10/23/2018   No results found for: PROLACTIN Lab Results  Component Value Date   CHOL 129 10/23/2018   TRIG 58 10/23/2018   HDL 50 10/23/2018   CHOLHDL 2.6 10/23/2018   VLDL 12 10/23/2018   LDLCALC 67 10/23/2018    Physical Findings: AIMS: Facial and Oral Movements Muscles of Facial Expression: None, normal Lips and Perioral Area: None, normal Jaw: None, normal Tongue: None, normal,Extremity Movements Upper (arms, wrists, hands, fingers): None, normal Lower (legs, knees, ankles, toes): None, normal, Trunk Movements Neck, shoulders, hips: None, normal, Overall Severity Severity of abnormal movements (highest score from questions above): None, normal Incapacitation due to abnormal movements: None, normal Patient's awareness of abnormal movements (rate only patient's report): No Awareness, Dental Status Current problems with teeth and/or dentures?: No Does patient usually wear dentures?: No  CIWA:  CIWA-Ar Total: 0 COWS:  COWS Total Score: 0  Musculoskeletal: Strength & Muscle Tone: within normal limits Gait & Station: normal Patient leans: N/A  Psychiatric Specialty Exam: Physical Exam  Nursing note and vitals reviewed. Constitutional: He appears well-developed and well-nourished.  HENT:  Head: Normocephalic and atraumatic.  Right Ear: External ear normal.  Eyes: Pupils are equal, round, and reactive to light. Conjunctivae are normal.  Neck: Normal range of motion.  Cardiovascular: Regular rhythm and normal heart sounds.  Respiratory: Effort normal.  GI: Soft.  Musculoskeletal: Normal range of motion.  Neurological: He is alert.  Skin: Skin is warm and dry.  Psychiatric: He has a normal mood and affect. His speech is  normal and behavior is normal. Judgment and thought content normal. Cognition and memory are normal.    Review of Systems  Constitutional: Negative.   HENT: Negative.   Eyes: Negative.   Respiratory: Negative.   Cardiovascular: Negative.   Gastrointestinal: Negative.   Musculoskeletal: Negative.   Skin: Negative.   Neurological: Negative.   Psychiatric/Behavioral: Negative.     Blood pressure 112/73, pulse 77, temperature 97.8 F (36.6 C), temperature source Oral, resp. rate 18, height  (1.803 m), weight 88.5 kg, SpO2 99 %.Body mass index is 27.2 kg/m.  General Appearance: Casual  Eye Contact:  Fair  Speech:  Clear and Coherent  Volume:  Normal  Mood:  Euthymic  Affect:  Congruent  Thought Process:  Goal Directed  Orientation:  Full (Time, Place, and Person)  Thought Content:  Logical  Suicidal Thoughts:  No  Homicidal Thoughts:  No  Memory:  Immediate;   Fair Recent;   Fair Remote;   Fair  Judgement:  Good  Insight:  Fair  Psychomotor Activity:  Normal  Concentration:  Concentration: Fair  Recall:  [employer] of Knowledge:  Fair  Language:  Fair  Akathisia:  No  Handed:  Right  AIMS (if indicated):     Assets:  Desire for Improvement Physical Health  ADL's:  Intact  Cognition:  WNL  Sleep:  Number of Hours: 5     Treatment Plan Summary: No change Daily contact with patient to assess and evaluate symptoms and progress in treatment, Medication management and Plan No further change to medication.  He is tolerating medicine without complaint.  Patient is stable.  We are still seeking a discharge plan.  Patrick North, MD 12/26/2018, 11:10 AM

## 2018-12-26 NOTE — Plan of Care (Signed)
Active in the milieu. Pleasant and cooperative.

## 2018-12-26 NOTE — Plan of Care (Signed)
D- Patient alert and oriented. Patient presents in a pleasant mood on assessment stating that he slept well last night and is "still sleep". Patient denies any signs/symptoms of depression and anxiety to this Clinical research associate. Patient also denies SI, HI, AVH, and pain at this time. Patient had no stated goals for today.  A- Support and encouragement provided. Routine safety checks conducted every 15 minutes. Patient informed to notify staff with problems or concerns.  R- Patient contracts for safety at this time. Patient compliant with treatment plan. Patient receptive, calm, and cooperative. Patient interacts well with others on the unit. Patient remains safe at this time.   Problem: Education: Goal: Knowledge of [facility] Education information/materials will improve Outcome: Progressing   Problem: Coping: Goal: Ability to verbalize frustrations and anger appropriately will improve Outcome: Progressing Goal: Ability to demonstrate self-control will improve Outcome: Progressing   Problem: Safety: Goal: Periods of time without injury will increase Outcome: Progressing   Problem: Education: Goal: Will be free of psychotic symptoms Outcome: Progressing Goal: Knowledge of the prescribed therapeutic regimen will improve Outcome: Progressing   Problem: Self-Concept: Goal: Will verbalize positive feelings about self Outcome: Progressing   Problem: Education: Goal: Ability to state activities that reduce stress will improve Outcome: Progressing

## 2018-12-26 NOTE — Progress Notes (Signed)
Patient didn't need any of his Eucerin cream this morning stating to this writer that he has "plenty left over from before".

## 2018-12-27 NOTE — Plan of Care (Signed)
Patient has been doing real well in the unit, socializing without any issues, attending groups and participating actively, compliant with his medical regimen without any side effects, patient denies any SI/HI/AVH and no signs of delusions and patient denies depression and anxiety, patient has being coping adequately has no physical complains, sleep is continuous without requiring any sleep aid, 15 minutes safety checks is maintained no distress.   Problem: Education: Goal: Knowledge of [facility] Education information/materials will improve 12/27/2018 0450 by Lelan Pons, RN Outcome: Progressing 12/27/2018 0449 by Lelan Pons, RN Outcome: Progressing 12/27/2018 0448 by Lelan Pons, RN Outcome: Progressing   Problem: Coping: Goal: Ability to verbalize frustrations and anger appropriately will improve 12/27/2018 0450 by Lelan Pons, RN Outcome: Progressing 12/27/2018 0449 by Lelan Pons, RN Outcome: Progressing 12/27/2018 0448 by Lelan Pons, RN Outcome: Progressing Goal: Ability to demonstrate self-control will improve 12/27/2018 0450 by Lelan Pons, RN Outcome: Progressing 12/27/2018 0449 by Lelan Pons, RN Outcome: Progressing 12/27/2018 0448 by Lelan Pons, RN Outcome: Progressing   Problem: Safety: Goal: Periods of time without injury will increase 12/27/2018 0450 by Lelan Pons, RN Outcome: Progressing 12/27/2018 0449 by Lelan Pons, RN Outcome: Progressing 12/27/2018 0448 by Lelan Pons, RN Outcome: Progressing   Problem: Education: Goal: Will be free of psychotic symptoms 12/27/2018 0450 by Lelan Pons, RN Outcome: Progressing 12/27/2018 0449 by Lelan Pons, RN Outcome: Progressing 12/27/2018 0448 by Lelan Pons, RN Outcome: Progressing Goal: Knowledge of the prescribed therapeutic regimen will improve 12/27/2018 0450 by Lelan Pons, RN Outcome:  Progressing 12/27/2018 0449 by Lelan Pons, RN Outcome: Progressing 12/27/2018 0448 by Lelan Pons, RN Outcome: Progressing   Problem: Self-Concept: Goal: Will verbalize positive feelings about self 12/27/2018 0450 by Lelan Pons, RN Outcome: Progressing 12/27/2018 0449 by Lelan Pons, RN Outcome: Progressing 12/27/2018 0448 by Lelan Pons, RN Outcome: Progressing   Problem: Education: Goal: Ability to state activities that reduce stress will improve 12/27/2018 0450 by Lelan Pons, RN Outcome: Progressing 12/27/2018 0449 by Lelan Pons, RN Outcome: Progressing 12/27/2018 0448 by Lelan Pons, RN Outcome: Progressing

## 2018-12-27 NOTE — Tx Team (Signed)
Interdisciplinary Treatment and Diagnostic Plan Update  12/27/2018 Time of Session: 8:30AM  Fergus Markoff MRN: [id number]  Principal Diagnosis: Schizoaffective disorder, bipolar type [facility])  Secondary Diagnoses: Principal Problem:   Schizoaffective disorder, bipolar type (HCC) Active Problems:   Noncompliance   Current Medications:  Current Facility-Administered Medications  Medication Dose Route Frequency Provider Last Rate Last Dose  . acetaminophen (TYLENOL) tablet 650 mg  650 mg Oral Q6H PRN [name], MD   650 mg at 10/26/18 2302  . alum & mag hydroxide-simeth (MAALOX/MYLANTA) 200-200-20 MG/5ML suspension 30 mL  30 mL Oral Q4H PRN [name], MD   30 mL at 10/26/18 2129  . ARIPiprazole ER (ABILIFY MAINTENA) injection 400 mg  400 mg Intramuscular Q28 days Charm Rings, NP   400 mg at 12/08/18 2217  . divalproex (DEPAKOTE ER) 24 hr tablet 1,500 mg  1,500 mg Oral QHS [name], [name], MD   1,500 mg at 12/26/18 2127  . hydrocerin (EUCERIN) cream   Topical BID [name], [name], MD      . hydrOXYzine (ATARAX/VISTARIL) tablet 50 mg  50 mg Oral Q4H PRN [name], MD   50 mg at 10/28/18 9518  . Influenza vac split quadrivalent PF (FLUARIX) injection 0.5 mL  0.5 mL Intramuscular Once Charm Rings, NP      . magnesium hydroxide (MILK OF MAGNESIA) suspension 30 mL  30 mL Oral Daily PRN [name], MD      . OLANZapine [facility]) injection 10 mg  10 mg Intramuscular Q6H PRN [name], [name], MD      . OLANZapine (ZYPREXA) tablet 30 mg  30 mg Oral QHS Charm Rings, NP   30 mg at 12/26/18 2127  . traZODone (DESYREL) tablet 100 mg  100 mg Oral QHS PRN [name], MD   100 mg at 11/26/18 2123   PTA Medications: No medications prior to admission.    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   Physician Treatment Plan for Primary Diagnosis: Schizoaffective disorder, bipolar type (HCC) Long Term Goal(s): Improvement in symptoms so as ready for discharge Improvement in symptoms so as ready for discharge   Short Term Goals: Ability to demonstrate self-control will improve Ability to identify and develop effective coping behaviors will improve Compliance with prescribed medications will improve  Medication Management: Evaluate patient's response, side effects, and tolerance of medication regimen.  Therapeutic Interventions: 1 to 1 [place], Unit Group [place] and Medication administration.  Evaluation of Outcomes: Progressing  Physician Treatment Plan for Secondary Diagnosis: Principal Problem:   Schizoaffective disorder, bipolar type (HCC) Active Problems:   Noncompliance  Long Term Goal(s): Improvement in symptoms so as ready for discharge Improvement in symptoms so as ready for discharge   Short Term Goals: Ability to demonstrate self-control will improve Ability to identify and develop effective coping behaviors will improve Compliance with prescribed medications will improve     Medication Management: Evaluate patient's response, side effects, and tolerance of medication regimen.  Therapeutic Interventions: 1 to 1 [place], Unit Group Decuir and Medication administration.  Evaluation of Outcomes: Progressing   RN Treatment Plan for Primary Diagnosis: Schizoaffective disorder, bipolar type (HCC) Long Term Goal(s): Knowledge of disease and therapeutic regimen to maintain health will improve  Short Term Goals: Ability to verbalize frustration and anger  appropriately will improve, Ability to demonstrate self-control, Ability to participate in decision making will improve and Ability to verbalize feelings will improve  Medication  Management: RN will administer medications as ordered by provider, will assess and evaluate patient's response and provide education to patient for prescribed medication. RN will report any adverse and/or side effects to prescribing provider.  Therapeutic Interventions: 1 on 1 counseling [place], Psychoeducation, Medication administration, Evaluate responses to treatment, Monitor vital signs and CBGs as ordered, Perform/monitor CIWA, COWS, AIMS and Fall Risk screenings as ordered, Perform wound care treatments as ordered.  Evaluation of Outcomes: Progressing   LCSW Treatment Plan for Primary Diagnosis: Schizoaffective disorder, bipolar type (HCC) Long Term Goal(s): Safe transition to appropriate next level of care at discharge, Engage patient in therapeutic group addressing interpersonal concerns.  Short Term Goals: Engage patient in aftercare planning with referrals and resources, Increase social support, Increase ability to appropriately verbalize feelings, Increase emotional regulation and Facilitate acceptance of mental health diagnosis and concerns  Therapeutic Interventions: Assess for all discharge needs, 1 to 1 time with Social worker, Explore available resources and support systems, Assess for adequacy in community support network, Educate family and significant other(s) on suicide prevention, Complete Psychosocial Assessment, Interpersonal group therapy.  Evaluation of Outcomes: Progressing   Progress in Treatment: Attending groups: Yes. Participating in groups: Yes. Taking medication as prescribed: Yes.   Toleration medication: Yes. Family/Significant other contact made: Yes, individual(s) contacted:  CSW completed SPE with the patients mother.  Patient understands diagnosis: Yes. Discussing patient identified problems/goals with staff: Yes. Medical problems stabilized or resolved: Yes. Denies suicidal/homicidal ideation: Yes. Issues/concerns per patient self-inventory:  No. Other: none  New problem(s) identified: No, Describe:  none  New Short Term/Long Term Goal(s): elimination of symptoms of psychosis, medication management for mood stabilization; elimination of SI thoughts; development of comprehensive mental wellness plan.  Patient Goals:  "independence"  Discharge Plan or Barriers: Pt's mother is his legal guardian.  Mother has agreed to referrals to group homes following a staffing with Nanine MeansJamison Lord, NP and Dr. Lucianne MussKumar.  CSW has [place] to group homes in [place], [place], [place], [place], [place], [place], [place], [place], [place], [place].  Most of the group homes have declined the patient for various reasons.  Patient has been removed [facility] due stability.  Though quite a few group homes have been contacted, only three are possibilities at this time.   Update 12/27/2018: No changes at this time.  Patient remains on the unit and CSW continues to search for group homes.  A phone conference was held with mother on 12/23/2018 and mother informed that in June pt will be discharged.  Mother reports that it is is "impossible" for patient to return to her home.  Mother is looking into possible apartment options for patient.      Reason for Continuation of Hospitalization: Aggression Delusions  Hallucinations Mania Medication stabilization  Estimated Length of Stay: TBD  Recreational Therapy: Patient Stressors: N/A Patient Goal: Patient will focus on task/topic with 2 prompts from staff within 5 recreation therapy group [place]  Attendees: Patient:  12/27/2018 10:58 AM  Physician: Dr. Toni Amendlapacs, MD 12/27/2018 10:58 AM  Nursing:  12/27/2018 10:58 AM  RN Care Manager: 12/27/2018 10:58 AM  Social Worker: Penni HomansMichaela Jazalynn Mireles, LCSW 12/27/2018 10:58 AM  Recreational Therapist:  12/27/2018 10:58 AM  Other:  12/27/2018 10:58 AM  Other:  12/27/2018 10:58 AM  Other: 12/27/2018 10:58 AM    Scribe for  Treatment Team: Harden Mo, LCSW 12/27/2018 10:58 AM

## 2018-12-27 NOTE — Plan of Care (Signed)
Patient is alert and oriented X3. Patient is cooperative on the unit with rules and groups. Patient denies SI, HI, but continues to have some bizarre thinking of capabilities to cast spells and control others through his powers. Patient able to verbalize frustrations and goes periods without injury. Safety checks to continue. Q 15 minutes. Problem: Education: Goal: Knowledge of [facility] Education information/materials will improve Outcome: Progressing   Problem: Coping: Goal: Ability to verbalize frustrations and anger appropriately will improve Outcome: Progressing Goal: Ability to demonstrate self-control will improve Outcome: Progressing   Problem: Safety: Goal: Periods of time without injury will increase Outcome: Progressing   Problem: Education: Goal: Will be free of psychotic symptoms Outcome: Progressing Goal: Knowledge of the prescribed therapeutic regimen will improve Outcome: Progressing

## 2018-12-27 NOTE — Progress Notes (Signed)
[facility]  12/27/2018 1:59 PM Jackson [name]  MRN:  [id number] Subjective: Follow-up patient with schizoaffective disorder.  No new complaints.  Sleeping adequately.  Behavior is perfectly fine.  No inappropriate behavior reported.  No complaints from anyone else.  Very appropriate in his interactions.  We have still been unable to find any place for him to live Principal Problem: Schizoaffective disorder, bipolar type (HCC) Diagnosis: Principal Problem:   Schizoaffective disorder, bipolar type (HCC) Active Problems:   Noncompliance  Total Time spent with patient: 20 minutes  Past Psychiatric History: Past history of schizoaffective disorder with several prior admissions  Past Medical History:  Past Medical History:  Diagnosis Date  . Bipolar 1 disorder (HCC)   . Schizoaffective disorder (HCC)    History reviewed. No pertinent surgical history. Family History: History reviewed. No pertinent family history. Family Psychiatric  History: None known Social History:  Social History   Substance and Sexual Activity  Alcohol Use Not on file     Social History   Substance and Sexual Activity  Drug Use Not on file    Social History   Socioeconomic History  . Marital status: Single    Spouse name: Not on file  . Number of children: Not on file  . Years of education: Not on file  . Highest education level: Not on file  Occupational History  . Not on file  Social Needs  . Financial resource strain: Not on file  . Food insecurity:    Worry: Not on file    Inability: Not on file  . Transportation needs:    Medical: Not on file    Non-medical: Not on file  Tobacco Use  . Smoking status: Light Tobacco Smoker    Packs/day: 0.25    Types: Cigarettes  . Smokeless tobacco: Never Used  Substance and Sexual Activity  . Alcohol use: Not on file  . Drug use: Not on file  . Sexual activity: Not on file  Lifestyle  . Physical activity:    Days per week: Not on file   Minutes per session: Not on file  . Stress: Not on file  Relationships  . Social connections:    Talks on phone: Not on file    Gets together: Not on file    Attends religious service: Not on file    Active member of club or organization: Not on file    Attends meetings of clubs or organizations: Not on file    Relationship status: Not on file  Other Topics Concern  . Not on file  Social History Narrative  . Not on file   Additional Social History:                         Sleep: Fair  Appetite:  Fair  Current Medications: Current Facility-Administered Medications  Medication Dose Route Frequency Provider Last Rate Last Dose  . acetaminophen (TYLENOL) tablet 650 mg  650 mg Oral Q6H PRN Terance HartStephens, [name], MD   650 mg at 10/26/18 2302  . alum & mag hydroxide-simeth (MAALOX/MYLANTA) 200-200-20 MG/5ML suspension 30 mL  30 mL Oral Q4H PRN Terance HartStephens, [name], MD   30 mL at 10/26/18 2129  . ARIPiprazole ER (ABILIFY MAINTENA) injection 400 mg  400 mg Intramuscular Q28 days Charm RingsLord, Jamison Y, NP   400 mg at 12/08/18 2217  . divalproex (DEPAKOTE ER) 24 hr tablet 1,500 mg  1,500 mg Oral QHS Seger Jani, Jackquline DenmarkJohn T, MD  1,500 mg at 12/26/18 2127  . hydrocerin (EUCERIN) cream   Topical BID Corazon Nickolas T, MD      . hydrOXYzine (ATARAX/VISTARIL) tablet 50 mg  50 mg Oral Q4H PRN [name], MD   50 mg at 10/28/18 0981  . Influenza vac split quadrivalent PF (FLUARIX) injection 0.5 mL  0.5 mL Intramuscular Once Charm Rings, NP      . magnesium hydroxide (MILK OF MAGNESIA) suspension 30 mL  30 mL Oral Daily PRN [name], MD      . OLANZapine [facility]) injection 10 mg  10 mg Intramuscular Q6H PRN Chrles Selley T, MD      . OLANZapine (ZYPREXA) tablet 30 mg  30 mg Oral QHS Charm Rings, NP   30 mg at 12/26/18 2127  . traZODone (DESYREL) tablet 100 mg  100 mg Oral QHS PRN [name], MD   100 mg at 11/26/18 2123    Lab Results: No results found for this or any  previous visit (from the past 48 hour(s)).  Blood Alcohol level:  Lab Results  Component Value Date   ETH <10 10/16/2018    Metabolic Disorder Labs: Lab Results  Component Value Date   HGBA1C 5.2 10/23/2018   MPG 102.54 10/23/2018   No results found for: PROLACTIN Lab Results  Component Value Date   CHOL 129 10/23/2018   TRIG 58 10/23/2018   HDL 50 10/23/2018   CHOLHDL 2.6 10/23/2018   VLDL 12 10/23/2018   LDLCALC 67 10/23/2018    Physical Findings: AIMS: Facial and Oral Movements Muscles of Facial Expression: None, normal Lips and Perioral Area: None, normal Jaw: None, normal Tongue: None, normal,Extremity Movements Upper (arms, wrists, hands, fingers): None, normal Lower (legs, knees, ankles, toes): None, normal, Trunk Movements Neck, shoulders, hips: None, normal, Overall Severity Severity of abnormal movements (highest score from questions above): None, normal Incapacitation due to abnormal movements: None, normal Patient's awareness of abnormal movements (rate only patient's report): No Awareness, Dental Status Current problems with teeth and/or dentures?: No Does patient usually wear dentures?: No  CIWA:  CIWA-Ar Total: 0 COWS:  COWS Total Score: 0  Musculoskeletal: Strength & Muscle Tone: within normal limits Gait & Station: normal Patient leans: N/A  Psychiatric Specialty Exam: Physical Exam  Nursing note and vitals reviewed. Constitutional: He appears well-developed and well-nourished.  HENT:  Head: Normocephalic and atraumatic.  Eyes: Pupils are equal, round, and reactive to light. Conjunctivae are normal.  Neck: Normal range of motion.  Cardiovascular: Regular rhythm and normal heart sounds.  Respiratory: Effort normal. No respiratory distress.  GI: Soft.  Musculoskeletal: Normal range of motion.  Neurological: He is alert.  Skin: Skin is warm and dry.  Psychiatric: He has a normal mood and affect. His behavior is normal. Judgment and thought  content normal.    Review of Systems  Constitutional: Negative.   HENT: Negative.   Eyes: Negative.   Respiratory: Negative.   Cardiovascular: Negative.   Gastrointestinal: Negative.   Musculoskeletal: Negative.   Skin: Negative.   Neurological: Negative.   Psychiatric/Behavioral: Negative.     Blood pressure 104/71, pulse 70, temperature 98 F (36.7 C), temperature source Oral, resp. rate 18, height  (1.803 m), weight 88.5 kg, SpO2 98 %.Body mass index is 27.2 kg/m.  General Appearance: Casual  Eye Contact:  Good  Speech:  Clear and Coherent  Volume:  Normal  Mood:  Euthymic  Affect:  Congruent  Thought Process:  Goal Directed  Orientation:  Full (Time, Place, and Person)  Thought Content:  Logical  Suicidal Thoughts:  No  Homicidal Thoughts:  No  Memory:  Immediate;   Fair Recent;   Fair Remote;   Fair  Judgement:  Fair  Insight:  Fair  Psychomotor Activity:  Normal  Concentration:  Concentration: Fair  Recall:  [employer] of Knowledge:  Fair  Language:  Fair  Akathisia:  No  Handed:  Right  AIMS (if indicated):     Assets:  Desire for Improvement  ADL's:  Intact  Cognition:  WNL  Sleep:  Number of Hours: 5     Treatment Plan Summary: Daily contact with patient to assess and evaluate symptoms and progress in treatment, Medication management and Plan Tolerated recent increase in Depakote dose.  We will probably redraw another level within a week.  Meanwhile no change to other medication or treatment plan.  Treatment team as a whole continues to focus on trying to find a safe discharge option for him.  Patient continues to be remarkably pleasant about this difficult situation  Mordecai Rasmussen, MD 12/27/2018, 1:59 PM

## 2018-12-27 NOTE — Progress Notes (Signed)
Recreation Therapy Notes   Date: 12/27/2018  Time: 9:30 am   Location: Craft room   Behavioral response: N/A   Intervention Topic: Coping skills  Discussion/Intervention: Patient did not attend group.   Clinical Observations/Feedback:  Patient did not attend group.   Maebell Lyvers LRT/CTRS        Alexea Blase 12/27/2018 10:59 AM

## 2018-12-28 MED ORDER — TUBERCULIN PPD 5 UNIT/0.1ML ID SOLN
5.0000 [IU] | Freq: Once | INTRADERMAL | Status: AC
  Administered 2018-12-28: 5 [IU] via INTRADERMAL
  Filled 2018-12-28 (×2): qty 0.1

## 2018-12-28 NOTE — B[facility] Group Notes (Signed)
  LCSW Group Therapy Note  12/28/2018 2:24 PM   Type of Therapy/Topic:  Group Therapy:  Feelings about Diagnosis  Participation Level:  Did Not Attend   Description of Group:   This group will allow patients to explore their thoughts and feelings about diagnoses they have received. Patients will be guided to explore their level of understanding and acceptance of these diagnoses. Facilitator will encourage patients to process their thoughts and feelings about the reactions of others to their diagnosis and will guide patients in identifying ways to discuss their diagnosis with significant others in their lives. This group will be process-oriented, with patients participating in exploration of their own experiences, giving and receiving support, and processing challenge from other group members.   Therapeutic Goals: 1. Patient will demonstrate understanding of diagnosis as evidenced by identifying two or more symptoms of the disorder 2. Patient will be able to express two feelings regarding the diagnosis 3. Patient will demonstrate their ability to communicate their needs through discussion and/or role play  Summary of Patient Progress: x   Therapeutic Modalities:   Cognitive Behavioral Therapy Brief Therapy Feelings Identification    Iris Pert, MSW, LCSW Clinical Social Work 12/28/2018 2:24 PM

## 2018-12-28 NOTE — BHH Counselor (Signed)
CSW spoke with mother to follow up after identifying a group home for the patient. Mother informed that patient has an appointment with Social Security on June 1st to discuss becoming his own payee.  Mother reports that she is unable to schedule any appointments or have the funds previously going to the patients old group home switched to the new group home.  She can not schedule appointment until after he has had his appointment. Mother reports that per that conversation, Social Security will follow up with Dr. Toni Amend or CSW to determine if patient can be his own payee.  Mother requested that pt NOT be made aware that she is aware of the appointment or that she will be scheduling a meeting of her own.  Mother reports that she has spoke with Scarlette Calico and will have a discussion "mid day today to go over paperwork".    CSW spoke with Scarlette Calico, the new group home owner.  Scarlette Calico reports that she has not spoken with the mother and is unaware of a meeting scheduled for later today. She reports that she needs the patient to have a TB test and Covid test and that she would be able to get the patient earliest of Thursday, once she has received payment.  She reports that she will not pick the patient up without payment.  CSW reports that she will have the mother follow up with her and arrange any payments.  CSW informed Dr. Toni Amend of pt being approved of a group home and in need of a TB test.  Pt has previously had a Covid test.  Dr. Toni Amend has ordered a TB test.   Penni Homans, MSW, LCSW 12/28/2018 10:25 AM

## 2018-12-28 NOTE — Plan of Care (Signed)
Patient aware of  information  given relating to [facility] education , patient  able to verbalize  understanding .  No anger outburst for past 24 hours . Able to maintained  self control . Voice of no safety concerns. Thought process improving. Compliant  with medication. Patient able to verbalize positive feeling of self . Patient remains delusional , with magical thinking . Isolates to room , picks and chooses what group to attend .  Problem: Education: Goal: Knowledge of [facility] Education information/materials will improve Outcome: Progressing   Problem: Coping: Goal: Ability to verbalize frustrations and anger appropriately will improve Outcome: Progressing Goal: Ability to demonstrate self-control will improve Outcome: Progressing   Problem: Safety: Goal: Periods of time without injury will increase Outcome: Progressing   Problem: Education: Goal: Will be free of psychotic symptoms Outcome: Progressing Goal: Knowledge of the prescribed therapeutic regimen will improve Outcome: Progressing   Problem: Self-Concept: Goal: Will verbalize positive feelings about self Outcome: Progressing   Problem: Education: Goal: Ability to state activities that reduce stress will improve Outcome: Progressing

## 2018-12-28 NOTE — Progress Notes (Signed)
Patient requested [employer]. I left note for chaplain springer

## 2018-12-28 NOTE — Progress Notes (Signed)
Recreation Therapy Notes   Date: 12/28/2018  Time: 9:30 am   Location: Craft room   Behavioral response: N/A   Intervention Topic: Goals  Discussion/Intervention: Patient did not attend group.   Clinical Observations/Feedback:  Patient did not attend group.   Kycen Spalla LRT/CTRS        Teosha Casso 12/28/2018 11:31 AM

## 2018-12-28 NOTE — Progress Notes (Signed)
[facility]  12/28/2018 2:56 PM Jackson [name]  MRN:  [id number] Subjective: Follow-up for this patient with schizoaffective disorder.  Patient has no new complaints.  Still interacts appropriately with others on the unit.  Patient has made an appointment by phone with social services to talk about his being his own payee next week.  Meanwhile it sounds like we may have found a group home for him perhaps as early as this upcoming week. Principal Problem: Schizoaffective disorder, bipolar type (HCC) Diagnosis: Principal Problem:   Schizoaffective disorder, bipolar type (HCC) Active Problems:   Noncompliance  Total Time spent with patient: 20 minutes  Past Psychiatric History: History of bipolar disorder with psychotic symptoms and previous hospitalizations  Past Medical History:  Past Medical History:  Diagnosis Date  . Bipolar 1 disorder (HCC)   . Schizoaffective disorder (HCC)    History reviewed. No pertinent surgical history. Family History: History reviewed. No pertinent family history. Family Psychiatric  History: See previous Social History:  Social History   Substance and Sexual Activity  Alcohol Use Not on file     Social History   Substance and Sexual Activity  Drug Use Not on file    Social History   Socioeconomic History  . Marital status: Single    Spouse name: Not on file  . Number of children: Not on file  . Years of education: Not on file  . Highest education level: Not on file  Occupational History  . Not on file  Social Needs  . Financial resource strain: Not on file  . Food insecurity:    Worry: Not on file    Inability: Not on file  . Transportation needs:    Medical: Not on file    Non-medical: Not on file  Tobacco Use  . Smoking status: Light Tobacco Smoker    Packs/day: 0.25    Types: Cigarettes  . Smokeless tobacco: Never Used  Substance and Sexual Activity  . Alcohol use: Not on file  . Drug use: Not on file  . Sexual  activity: Not on file  Lifestyle  . Physical activity:    Days per week: Not on file    Minutes per session: Not on file  . Stress: Not on file  Relationships  . Social connections:    Talks on phone: Not on file    Gets together: Not on file    Attends religious service: Not on file    Active member of club or organization: Not on file    Attends meetings of clubs or organizations: Not on file    Relationship status: Not on file  Other Topics Concern  . Not on file  Social History Narrative  . Not on file   Additional Social History:                         Sleep: Fair  Appetite:  Fair  Current Medications: Current Facility-Administered Medications  Medication Dose Route Frequency Provider Last Rate Last Dose  . acetaminophen (TYLENOL) tablet 650 mg  650 mg Oral Q6H PRN Terance HartStephens, [name], MD   650 mg at 10/26/18 2302  . alum & mag hydroxide-simeth (MAALOX/MYLANTA) 200-200-20 MG/5ML suspension 30 mL  30 mL Oral Q4H PRN Terance HartStephens, [name], MD   30 mL at 10/26/18 2129  . ARIPiprazole ER (ABILIFY MAINTENA) injection 400 mg  400 mg Intramuscular Q28 days Charm RingsLord, Jamison Y, NP   400 mg at 12/08/18 2217  .  divalproex (DEPAKOTE ER) 24 hr tablet 1,500 mg  1,500 mg Oral QHS [name], [name], MD   1,500 mg at 12/27/18 2237  . hydrocerin (EUCERIN) cream   Topical BID [name], [name], MD   1 application at 12/28/18 0820  . hydrOXYzine (ATARAX/VISTARIL) tablet 50 mg  50 mg Oral Q4H PRN [name], MD   50 mg at 10/28/18 8144  . Influenza vac split quadrivalent PF (FLUARIX) injection 0.5 mL  0.5 mL Intramuscular Once Charm Rings, NP      . magnesium hydroxide (MILK OF MAGNESIA) suspension 30 mL  30 mL Oral Daily PRN [name], MD      . OLANZapine [facility]) injection 10 mg  10 mg Intramuscular Q6H PRN [name], [name], MD      . OLANZapine (ZYPREXA) tablet 30 mg  30 mg Oral QHS Charm Rings, NP   30 mg at 12/27/18 2237  . traZODone (DESYREL) tablet 100 mg   100 mg Oral QHS PRN [name], MD   100 mg at 11/26/18 2123  . tuberculin injection 5 Units  5 Units Intradermal Once [name], [name], MD        Lab Results: No results found for this or any previous visit (from the past 48 hour(s)).  Blood Alcohol level:  Lab Results  Component Value Date   ETH <10 10/16/2018    Metabolic Disorder Labs: Lab Results  Component Value Date   HGBA1C 5.2 10/23/2018   MPG 102.54 10/23/2018   No results found for: PROLACTIN Lab Results  Component Value Date   CHOL 129 10/23/2018   TRIG 58 10/23/2018   HDL 50 10/23/2018   CHOLHDL 2.6 10/23/2018   VLDL 12 10/23/2018   LDLCALC 67 10/23/2018    Physical Findings: AIMS: Facial and Oral Movements Muscles of Facial Expression: None, normal Lips and Perioral Area: None, normal Jaw: None, normal Tongue: None, normal,Extremity Movements Upper (arms, wrists, hands, fingers): None, normal Lower (legs, knees, ankles, toes): None, normal, Trunk Movements Neck, shoulders, hips: None, normal, Overall Severity Severity of abnormal movements (highest score from questions above): None, normal Incapacitation due to abnormal movements: None, normal Patient's awareness of abnormal movements (rate only patient's report): No Awareness, Dental Status Current problems with teeth and/or dentures?: No Does patient usually wear dentures?: No  CIWA:  CIWA-Ar Total: 0 COWS:  COWS Total Score: 0  Musculoskeletal: Strength & Muscle Tone: within normal limits Gait & Station: normal Patient leans: N/A  Psychiatric Specialty Exam: Physical Exam  Nursing note and vitals reviewed. Constitutional: He appears well-developed and well-nourished.  HENT:  Head: Normocephalic and atraumatic.  Eyes: Pupils are equal, round, and reactive to light. Conjunctivae are normal.  Neck: Normal range of motion.  Cardiovascular: Regular rhythm and normal heart sounds.  Respiratory: Effort normal.  GI: Soft.   Musculoskeletal: Normal range of motion.  Neurological: He is alert.  Skin: Skin is warm and dry.  Psychiatric: He has a normal mood and affect. His behavior is normal. Judgment and thought content normal.    Review of Systems  Constitutional: Negative.   HENT: Negative.   Eyes: Negative.   Respiratory: Negative.   Cardiovascular: Negative.   Gastrointestinal: Negative.   Musculoskeletal: Negative.   Skin: Negative.   Neurological: Negative.   Psychiatric/Behavioral: Negative.     Blood pressure 119/84, pulse 74, temperature 98.1 F (36.7 C), temperature source Oral, resp. rate 18, height 5\' 11"  (1.803 m), weight 88.5 kg, SpO2 98 %.Body  mass index is 27.2 kg/m.  General Appearance: Casual  Eye Contact:  Fair  Speech:  Slow  Volume:  Normal  Mood:  Euthymic  Affect:  Congruent  Thought Process:  Coherent  Orientation:  Full (Time, Place, and Person)  Thought Content:  Logical  Suicidal Thoughts:  No  Homicidal Thoughts:  No  Memory:  Immediate;   Fair Recent;   Fair Remote;   Fair  Judgement:  Fair  Insight:  Fair  Psychomotor Activity:  Normal  Concentration:  Concentration: Fair  Recall:  [employer] of Knowledge:  Fair  Language:  Fair  Akathisia:  No  Handed:  Right  AIMS (if indicated):     Assets:  Desire for Improvement Physical Health Social Support  ADL's:  Intact  Cognition:  WNL  Sleep:  Number of Hours: 5     Treatment Plan Summary: Daily contact with patient to assess and evaluate symptoms and progress in treatment, Medication management and Plan No changes to medication.  Tolerating the elevated dose of Depakote fine.  Probably recheck the level in another couple days.  There is a possibility of that we may be able to get him discharged within the next few days.  They have requested a tuberculin test which I have ordered.  Mordecai Rasmussen, MD 12/28/2018, 2:56 PM

## 2018-12-28 NOTE — Plan of Care (Signed)
Patient has developed adequate coping skills, and maintaining safety in the unit, and doing his own ADLs with no assistant, patient is taking his medicines with out any side effects , resting comfortably  only requiring 15  minutes routine safety checks for safety, no distress, denies any SI/HI/AVH.   Problem: Education: Goal: Knowledge of [facility] Education information/materials will improve Outcome: Progressing   Problem: Coping: Goal: Ability to verbalize frustrations and anger appropriately will improve Outcome: Progressing Goal: Ability to demonstrate self-control will improve Outcome: Progressing   Problem: Safety: Goal: Periods of time without injury will increase Outcome: Progressing   Problem: Education: Goal: Will be free of psychotic symptoms Outcome: Progressing Goal: Knowledge of the prescribed therapeutic regimen will improve Outcome: Progressing   Problem: Self-Concept: Goal: Will verbalize positive feelings about self Outcome: Progressing   Problem: Education: Goal: Ability to state activities that reduce stress will improve Outcome: Progressing

## 2018-12-28 NOTE — BHH Counselor (Signed)
CSW met with the patient to assess if there were any needs. Patient reports that he is fine.  Paitent requested to have the name of the "intake lady with gray hair, kinda heavyset". CSW informed pt that she is unsure of who the patient was referring to.  CSW asked if CSW could be of support someway and was informed that patient wanted list of affordable housing.  CSW informed that list would have to be run by the mother as she is his guardian.    Patient also asked for a chaplain.  CSW paged for a chaplain and is awaiting a return call.  Assunta Curtis, MSW, LCSW 12/28/2018 2:57 PM

## 2018-12-28 NOTE — Progress Notes (Signed)
D: Patient stated slept good last night .Stated appetite  good and energy level   normal. Stated concentration good . Stated no Depression , hopeless oranxiety .(Denies suicidal  homicidal ideations  .  No auditory hallucinations  No pain concerns . Appropriate ADL'S. Interacting with peers and staff. Patient aware of information given relating to [facility] education , patient able to verbalize understanding . No anger outburst for past 24 hours . Able to maintained self control . Voice of no safety concerns. Thought process improving. Compliant with medication. Patient able to verbalize positive feeling of self .Patient remains delusional , with magical thinking .Isolates to room , picks and chooseswhat group to attend . Patient did not attend  Any programing  This shift .  A: Encourage patient participation with unit programming . Instruction  Given on  Medication , verbalize understanding. R: Voice no other concerns. Staff continue to monitor

## 2018-12-29 NOTE — Plan of Care (Signed)
  Problem: Education: Goal: Knowledge of [facility] Education information/materials will improve Outcome: Progressing   Problem: Coping: Goal: Ability to verbalize frustrations and anger appropriately will improve Outcome: Progressing Goal: Ability to demonstrate self-control will improve Outcome: Progressing   Problem: Safety: Goal: Periods of time without injury will increase Outcome: Progressing   Problem: Education: Goal: Will be free of psychotic symptoms Outcome: Progressing Goal: Knowledge of the prescribed therapeutic regimen will improve Outcome: Progressing   Problem: Self-Concept: Goal: Will verbalize positive feelings about self Outcome: Progressing   Problem: Education: Goal: Ability to state activities that reduce stress will improve Outcome: Progressing   

## 2018-12-29 NOTE — BH Assessment (Signed)
CSW spoke with Allen Norris at [facility].  CSW confirmed that patient is still eligible for bed and group home will wait for funds to be transferred on June 1st.    Scarlette Calico reports that all paperwork is complete and at this time we are just waiting on results of TB test and funding.  Penni Homans, MSW, LCSW 12/29/2018 9:24 AM

## 2018-12-29 NOTE — Progress Notes (Signed)
[facility] MD Progress Note  12/29/2018 4:02 PM Jackson [name]  MRN:  [id number] Subjective: Patient seen chart reviewed.  Patient with schizoaffective disorder who has been here a couple months.  We have been unable to find him any place to live.  Now we finally have a lead on a possible place for him to go and we are delayed because he has a hearing coming up on his request to be his own payee.  Behavior problems.  If you stop and talk to him for very long it is clear that he still is focused on some odd and bizarre thinking but he mostly keeps it to himself.  Able to take care of himself not aggressive at all at least on the unit. Principal Problem: Schizoaffective disorder, bipolar type (HCC) Diagnosis: Principal Problem:   Schizoaffective disorder, bipolar type (HCC) Active Problems:   Noncompliance  Total Time spent with patient: 20 minutes  Past Psychiatric History: Patient history of bipolar or schizoaffective disorder with lengthy hospitalizations  Past Medical History:  Past Medical History:  Diagnosis Date  . Bipolar 1 disorder (HCC)   . Schizoaffective disorder (HCC)    History reviewed. No pertinent surgical history. Family History: History reviewed. No pertinent family history. Family Psychiatric  History: See previous Social History:  Social History   Substance and Sexual Activity  Alcohol Use Not on file     Social History   Substance and Sexual Activity  Drug Use Not on file    Social History   Socioeconomic History  . Marital status: Single    Spouse name: Not on file  . Number of children: Not on file  . Years of education: Not on file  . Highest education level: Not on file  Occupational History  . Not on file  Social Needs  . Financial resource strain: Not on file  . Food insecurity:    Worry: Not on file    Inability: Not on file  . Transportation needs:    Medical: Not on file    Non-medical: Not on file  Tobacco Use  . Smoking status: Light  Tobacco Smoker    Packs/day: 0.25    Types: Cigarettes  . Smokeless tobacco: Never Used  Substance and Sexual Activity  . Alcohol use: Not on file  . Drug use: Not on file  . Sexual activity: Not on file  Lifestyle  . Physical activity:    Days per week: Not on file    Minutes per session: Not on file  . Stress: Not on file  Relationships  . Social connections:    Talks on phone: Not on file    Gets together: Not on file    Attends religious service: Not on file    Active member of club or organization: Not on file    Attends meetings of clubs or organizations: Not on file    Relationship status: Not on file  Other Topics Concern  . Not on file  Social History Narrative  . Not on file   Additional Social History:                         Sleep: Fair  Appetite:  Fair  Current Medications: Current Facility-Administered Medications  Medication Dose Route Frequency Provider Last Rate Last Dose  . acetaminophen (TYLENOL) tablet 650 mg  650 mg Oral Q6H PRN [name], MD   650 mg at 10/26/18 2302  . alum & mag hydroxide-simeth (  MAALOX/MYLANTA) 200-200-20 MG/5ML suspension 30 mL  30 mL Oral Q4H PRN [name], MD   30 mL at 10/26/18 2129  . ARIPiprazole ER (ABILIFY MAINTENA) injection 400 mg  400 mg Intramuscular Q28 days Charm Rings, NP   400 mg at 12/08/18 2217  . divalproex (DEPAKOTE ER) 24 hr tablet 1,500 mg  1,500 mg Oral QHS Taronda Comacho T, MD   1,500 mg at 12/28/18 2213  . hydrocerin (EUCERIN) cream   Topical BID Quintel Mccalla T, MD      . hydrOXYzine (ATARAX/VISTARIL) tablet 50 mg  50 mg Oral Q4H PRN [name], MD   50 mg at 10/28/18 1191  . Influenza vac split quadrivalent PF (FLUARIX) injection 0.5 mL  0.5 mL Intramuscular Once Charm Rings, NP      . magnesium hydroxide (MILK OF MAGNESIA) suspension 30 mL  30 mL Oral Daily PRN [name], MD      . OLANZapine [facility]) injection 10 mg  10 mg Intramuscular Q6H PRN  Shanicka Oldenkamp T, MD      . OLANZapine (ZYPREXA) tablet 30 mg  30 mg Oral QHS Charm Rings, NP   30 mg at 12/28/18 2212  . traZODone (DESYREL) tablet 100 mg  100 mg Oral QHS PRN [name], MD   100 mg at 11/26/18 2123  . tuberculin injection 5 Units  5 Units Intradermal Once Melis Trochez, [name], MD   5 Units at 12/28/18 1601    Lab Results: No results found for this or any previous visit (from the past 48 hour(s)).  Blood Alcohol level:  Lab Results  Component Value Date   ETH <10 10/16/2018    Metabolic Disorder Labs: Lab Results  Component Value Date   HGBA1C 5.2 10/23/2018   MPG 102.54 10/23/2018   No results found for: PROLACTIN Lab Results  Component Value Date   CHOL 129 10/23/2018   TRIG 58 10/23/2018   HDL 50 10/23/2018   CHOLHDL 2.6 10/23/2018   VLDL 12 10/23/2018   LDLCALC 67 10/23/2018    Physical Findings: AIMS: Facial and Oral Movements Muscles of Facial Expression: None, normal Lips and Perioral Area: None, normal Jaw: None, normal Tongue: None, normal,Extremity Movements Upper (arms, wrists, hands, fingers): None, normal Lower (legs, knees, ankles, toes): None, normal, Trunk Movements Neck, shoulders, hips: None, normal, Overall Severity Severity of abnormal movements (highest score from questions above): None, normal Incapacitation due to abnormal movements: None, normal Patient's awareness of abnormal movements (rate only patient's report): No Awareness, Dental Status Current problems with teeth and/or dentures?: No Does patient usually wear dentures?: No  CIWA:  CIWA-Ar Total: 0 COWS:  COWS Total Score: 0  Musculoskeletal: Strength & Muscle Tone: within normal limits Gait & Station: normal Patient leans: N/A  Psychiatric Specialty Exam: Physical Exam  Nursing note and vitals reviewed. Constitutional: He appears well-developed and well-nourished.  HENT:  Head: Normocephalic and atraumatic.  Eyes: Pupils are equal, round, and reactive  to light. Conjunctivae are normal.  Neck: Normal range of motion.  Cardiovascular: Regular rhythm and normal heart sounds.  Respiratory: Effort normal.  GI: Soft.  Musculoskeletal: Normal range of motion.  Neurological: He is alert.  Skin: Skin is warm and dry.  Psychiatric: He has a normal mood and affect. His speech is normal and behavior is normal. Judgment and thought content normal. Cognition and memory are normal.    Review of Systems  Constitutional: Negative.   HENT: Negative.   Eyes:  Negative.   Respiratory: Negative.   Cardiovascular: Negative.   Gastrointestinal: Negative.   Musculoskeletal: Negative.   Skin: Negative.   Neurological: Negative.   Psychiatric/Behavioral: Negative.     Blood pressure 110/71, pulse 67, temperature 98.4 F (36.9 C), temperature source Oral, resp. rate 18, height 5\' 11"  (1.803 m), weight 88.5 kg, SpO2 99 %.Body mass index is 27.2 kg/m.  General Appearance: Casual  Eye Contact:  Good  Speech:  Clear and Coherent  Volume:  Normal  Mood:  Euthymic  Affect:  Congruent  Thought Process:  Goal Directed  Orientation:  Full (Time, Place, and Person)  Thought Content:  Logical  Suicidal Thoughts:  No  Homicidal Thoughts:  No  Memory:  Immediate;   Fair Recent;   Fair Remote;   Fair  Judgement:  Fair  Insight:  Fair  Psychomotor Activity:  Normal  Concentration:  Concentration: Fair  Recall:  [employer]Fair  Fund of Knowledge:  Fair  Language:  Fair  Akathisia:  No  Handed:  Right  AIMS (if indicated):     Assets:  Desire for Improvement Social Support  ADL's:  Intact  Cognition:  WNL  Sleep:  Number of Hours: 5.15     Treatment Plan Summary: Daily contact with patient to assess and evaluate symptoms and progress in treatment, Medication management and Plan He remains no problem with his behavior.  Takes his medicine regularly.  Still has a little bit of paranoid thinking and unrealistic plans for the future.  Potentially agreeable to the  group home if we can get arrangements to come through but it may not be until next week.  Jackson RasmussenJohn Insiya Oshea, MD 12/29/2018, 4:02 PM

## 2018-12-29 NOTE — BHH Group Notes (Signed)
LCSW Group Therapy Note  12/29/2018 1:00 PM  Type of Therapy/Topic:  Group Therapy:  Emotion Regulation  Participation Level:  Did Not Attend   Description of Group:   The purpose of this group is to assist patients in learning to regulate negative emotions and experience positive emotions. Patients will be guided to discuss ways in which they have been vulnerable to their negative emotions. These vulnerabilities will be juxtaposed with experiences of positive emotions or situations, and patients will be challenged to use positive emotions to combat negative ones. Special emphasis will be placed on coping with negative emotions in conflict situations, and patients will process healthy conflict resolution skills.  Therapeutic Goals: 1. Patient will identify two positive emotions or experiences to reflect on in order to balance out negative emotions 2. Patient will label two or more emotions that they find the most difficult to experience 3. Patient will demonstrate positive conflict resolution skills through discussion and/or role plays  Summary of Patient Progress: X  Therapeutic Modalities:   Cognitive Behavioral Therapy Feelings Identification Dialectical Behavioral Therapy  Penni Homans, MSW, LCSW 12/29/2018 11:33 AM

## 2018-12-29 NOTE — Plan of Care (Signed)
Patient has positive coping skills , patient is writing letters and writing songs for him self , socializing with his peers and with out any issues , encouraging and educating other patients, showing good behaviors in the unit and maintaining adequate safety manners, patient voice no concerns , takes his medicine regular with out side effects , patient has positive feeling about himself, patient eats well and sleeps good at night only requiring 15 minutes safety checks no distress..   Problem: Education: Goal: Knowledge of [facility] Education information/materials will improve Outcome: Progressing   Problem: Coping: Goal: Ability to verbalize frustrations and anger appropriately will improve Outcome: Progressing Goal: Ability to demonstrate self-control will improve Outcome: Progressing   Problem: Safety: Goal: Periods of time without injury will increase Outcome: Progressing   Problem: Education: Goal: Will be free of psychotic symptoms Outcome: Progressing Goal: Knowledge of the prescribed therapeutic regimen will improve Outcome: Progressing   Problem: Self-Concept: Goal: Will verbalize positive feelings about self Outcome: Progressing   Problem: Education: Goal: Ability to state activities that reduce stress will improve Outcome: Progressing

## 2018-12-29 NOTE — Progress Notes (Signed)
D:Pt alert and oriented x 4. Pt present and interactive in milieu. Pt intrusive at times, but responds to redirection. Pt denies pain or needs. No distress is noted. Pt is seen laughing and joking with peers in the dayroom. Pt is bright and seeks out interactions.  A: Pt is encouraged to attend groups and participate appropriately. Pt is given medications as reflected in [facility] and as ordered by provider. Pt is rounded on to ensure safety as ordered by MD. R: Will continue to monitor for safety and comfort

## 2018-12-29 NOTE — Progress Notes (Signed)
Recreation Therapy Notes  Date: 12/29/2018  Time: 9:30 am   Location: Craft room   Behavioral response: N/A   Intervention Topic: Stress  Discussion/Intervention: Patient did not attend group.   Clinical Observations/Feedback:  Patient did not attend group.   Khalin Royce LRT/CTRS        Jackson [name] 12/29/2018 11:07 AM 

## 2018-12-29 NOTE — Progress Notes (Signed)
Pastoral Care Visit   12/29/18 1400  Clinical Encounter Type  Visited With Patient  Visit Type Follow-up  Consult/Referral To Chaplain   Pt was eager for chap to visit and noted that he now had two chairs for [place] to sit in and talk together in his room.  Pt was in a good mood, smiling, and even verbalized that "I can't believe I am smiling" while sharing about something that makes me mad. This medicine must be really good."  Pt shared about not wanting to go to a group home, about his money he anticipates receiving on June 1, and his plan to walk out of the group home and live on his own at a boarding house once he is out of here.  Pt shared that being here has been his best housing experience in years.  He feels like he has lots of support, clean environment, and healthy food.  Pt shared about some of his theological viewpoints. Pt and chap looked at several scriptures together.  Pt was thankful for chap visit, though chap had to leave early due to page.  Milinda Antis, [address]

## 2018-12-29 NOTE — Plan of Care (Signed)
Pt rated sleep "pretty good". Pt denies SI, HI, AVH, depression and anxiety. Pt has "no goal" today. Pt was educated and verbalizes understanding. Torrie Mayers RN Problem: Education: Goal: Knowledge of [place] [facility] Education information/materials will improve Outcome: Progressing   Problem: Coping: Goal: Ability to verbalize frustrations and anger appropriately will improve Outcome: Progressing Goal: Ability to demonstrate self-control will improve Outcome: Progressing   Problem: Safety: Goal: Periods of time without injury will increase Outcome: Progressing   Problem: Education: Goal: Will be free of psychotic symptoms Outcome: Progressing Goal: Knowledge of the prescribed therapeutic regimen will improve Outcome: Progressing   Problem: Self-Concept: Goal: Will verbalize positive feelings about self Outcome: Progressing   Problem: Education: Goal: Ability to state activities that reduce stress will improve Outcome: Progressing

## 2018-12-30 NOTE — Progress Notes (Addendum)
Torrie Mayers RN - charted note on wrong pt.

## 2018-12-30 NOTE — BHH Group Notes (Signed)
Balance In Life 12/30/2018 1PM  Type of Therapy/Topic:  Group Therapy:  Balance in Life  Participation Level:  Minimal  Description of Group:   This group will address the concept of balance and how it feels and looks when one is unbalanced. Patients will be encouraged to process areas in their lives that are out of balance and identify reasons for remaining unbalanced. Facilitators will guide patients in utilizing problem-solving interventions to address and correct the stressor making their life unbalanced. Understanding and applying boundaries will be explored and addressed for obtaining and maintaining a balanced life. Patients will be encouraged to explore ways to assertively make their unbalanced needs known to significant others in their lives, using other group members and facilitator for support and feedback.  Therapeutic Goals: 1. Patient will identify two or more emotions or situations they have that consume much of in their lives. 2. Patient will identify signs/triggers that life has become out of balance:  3. Patient will identify two ways to set boundaries in order to achieve balance in their lives:  4. Patient will demonstrate ability to communicate their needs through discussion and/or role plays  Summary of Patient Progress: Pt came to group late, no input provided. Pt sat quietly and journaled.   Therapeutic Modalities:   Cognitive Behavioral Therapy Solution-Focused Therapy Assertiveness Training  Karrie Fluellen Philip Aspen, LCSW

## 2018-12-30 NOTE — Progress Notes (Signed)
[facility]  12/30/2018 5:10 PM Jackson [name]  MRN:  [id number] Subjective: Follow-up patient with schizoaffective disorder.  No new new complaints.  No behavior problems. Principal Problem: Schizoaffective disorder, bipolar type (HCC) Diagnosis: Principal Problem:   Schizoaffective disorder, bipolar type (HCC) Active Problems:   Noncompliance  Total Time spent with patient: 15 minutes  Past Psychiatric History: Multiple past hospitalizations.  Past Medical History:  Past Medical History:  Diagnosis Date  . Bipolar 1 disorder (HCC)   . Schizoaffective disorder (HCC)    History reviewed. No pertinent surgical history. Family History: History reviewed. No pertinent family history. Family Psychiatric  History: See previous Social History:  Social History   Substance and Sexual Activity  Alcohol Use Not on file     Social History   Substance and Sexual Activity  Drug Use Not on file    Social History   Socioeconomic History  . Marital status: Single    Spouse name: Not on file  . Number of children: Not on file  . Years of education: Not on file  . Highest education level: Not on file  Occupational History  . Not on file  Social Needs  . Financial resource strain: Not on file  . Food insecurity:    Worry: Not on file    Inability: Not on file  . Transportation needs:    Medical: Not on file    Non-medical: Not on file  Tobacco Use  . Smoking status: Light Tobacco Smoker    Packs/day: 0.25    Types: Cigarettes  . Smokeless tobacco: Never Used  Substance and Sexual Activity  . Alcohol use: Not on file  . Drug use: Not on file  . Sexual activity: Not on file  Lifestyle  . Physical activity:    Days per week: Not on file    Minutes per session: Not on file  . Stress: Not on file  Relationships  . Social connections:    Talks on phone: Not on file    Gets together: Not on file    Attends religious service: Not on file    Active member of club  or organization: Not on file    Attends meetings of clubs or organizations: Not on file    Relationship status: Not on file  Other Topics Concern  . Not on file  Social History Narrative  . Not on file   Additional Social History:                         Sleep: Fair  Appetite:  Fair  Current Medications: Current Facility-Administered Medications  Medication Dose Route Frequency Provider Last Rate Last Dose  . acetaminophen (TYLENOL) tablet 650 mg  650 mg Oral Q6H PRN Terance HartStephens, [name], MD   650 mg at 10/26/18 2302  . alum & mag hydroxide-simeth (MAALOX/MYLANTA) 200-200-20 MG/5ML suspension 30 mL  30 mL Oral Q4H PRN Terance HartStephens, [name], MD   30 mL at 10/26/18 2129  . ARIPiprazole ER (ABILIFY MAINTENA) injection 400 mg  400 mg Intramuscular Q28 days Charm RingsLord, Jamison Y, NP   400 mg at 12/08/18 2217  . divalproex (DEPAKOTE ER) 24 hr tablet 1,500 mg  1,500 mg Oral QHS Jonisha Kindig T, MD   1,500 mg at 12/29/18 2253  . hydrocerin (EUCERIN) cream   Topical BID Mahmood Boehringer T, MD      . hydrOXYzine (ATARAX/VISTARIL) tablet 50 mg  50 mg Oral Q4H PRN Zonia KiefStephens,  Kirk Ruths, MD   50 mg at 10/28/18 1610  . Influenza vac split quadrivalent PF (FLUARIX) injection 0.5 mL  0.5 mL Intramuscular Once Charm Rings, NP      . magnesium hydroxide (MILK OF MAGNESIA) suspension 30 mL  30 mL Oral Daily PRN [name], MD      . OLANZapine [facility]) injection 10 mg  10 mg Intramuscular Q6H PRN Nochum Fenter T, MD      . OLANZapine (ZYPREXA) tablet 30 mg  30 mg Oral QHS Charm Rings, NP   30 mg at 12/29/18 2252  . traZODone (DESYREL) tablet 100 mg  100 mg Oral QHS PRN [name], MD   100 mg at 11/26/18 2123    Lab Results: No results found for this or any previous visit (from the past 48 hour(s)).  Blood Alcohol level:  Lab Results  Component Value Date   ETH <10 10/16/2018    Metabolic Disorder Labs: Lab Results  Component Value Date   HGBA1C 5.2 10/23/2018   MPG  102.54 10/23/2018   No results found for: PROLACTIN Lab Results  Component Value Date   CHOL 129 10/23/2018   TRIG 58 10/23/2018   HDL 50 10/23/2018   CHOLHDL 2.6 10/23/2018   VLDL 12 10/23/2018   LDLCALC 67 10/23/2018    Physical Findings: AIMS: Facial and Oral Movements Muscles of Facial Expression: None, normal Lips and Perioral Area: None, normal Jaw: None, normal Tongue: None, normal,Extremity Movements Upper (arms, wrists, hands, fingers): None, normal Lower (legs, knees, ankles, toes): None, normal, Trunk Movements Neck, shoulders, hips: None, normal, Overall Severity Severity of abnormal movements (highest score from questions above): None, normal Incapacitation due to abnormal movements: None, normal Patient's awareness of abnormal movements (rate only patient's report): No Awareness, Dental Status Current problems with teeth and/or dentures?: No Does patient usually wear dentures?: No  CIWA:  CIWA-Ar Total: 0 COWS:  COWS Total Score: 0  Musculoskeletal: Strength & Muscle Tone: within normal limits Gait & Station: normal Patient leans: N/A  Psychiatric Specialty Exam: Physical Exam  Nursing note and vitals reviewed. Constitutional: He appears well-developed and well-nourished.  HENT:  Head: Normocephalic and atraumatic.  Eyes: Pupils are equal, round, and reactive to light. Conjunctivae are normal.  Neck: Normal range of motion.  Cardiovascular: Regular rhythm and normal heart sounds.  Respiratory: Effort normal. No respiratory distress.  GI: Soft.  Musculoskeletal: Normal range of motion.  Neurological: He is alert.  Skin: Skin is warm and dry.  Psychiatric: He has a normal mood and affect. His behavior is normal. Judgment and thought content normal.    Review of Systems  Constitutional: Negative.   HENT: Negative.   Eyes: Negative.   Respiratory: Negative.   Cardiovascular: Negative.   Gastrointestinal: Negative.   Musculoskeletal: Negative.    Skin: Negative.   Neurological: Negative.   Psychiatric/Behavioral: Negative.     Blood pressure 100/73, pulse 70, temperature 97.6 F (36.4 C), temperature source Oral, resp. rate 18, height  (1.803 m), weight 88.5 kg, SpO2 98 %.Body mass index is 27.2 kg/m.  General Appearance: Fairly Groomed  Eye Contact:  Fair  Speech:  Clear and Coherent  Volume:  Normal  Mood:  Euthymic  Affect:  Congruent  Thought Process:  Goal Directed  Orientation:  Full (Time, Place, and Person)  Thought Content:  Logical  Suicidal Thoughts:  No  Homicidal Thoughts:  No  Memory:  Immediate;   Fair Recent;   Fair Remote;  Fair  Judgement:  Fair  Insight:  Fair  Psychomotor Activity:  Normal  Concentration:  Concentration: Fair  Recall:  [employer] of Knowledge:  Fair  Language:  Fair  Akathisia:  No  Handed:  Right  AIMS (if indicated):     Assets:  Desire for Improvement Housing Physical Health  ADL's:  Intact  Cognition:  WNL  Sleep:  Number of Hours: 4.25     Treatment Plan Summary: Daily contact with patient to assess and evaluate symptoms and progress in treatment, Medication management and Plan No change to medication.  No change to overall treatment plan.  Still looking for discharge.  Mordecai Rasmussen, MD 12/30/2018, 5:10 PM

## 2018-12-30 NOTE — Plan of Care (Signed)
Pt denies depression , anxiety, SI, HI and AVH, Pt says "I must have slept like a baby". Pt has a goal to "finish my song". Pt was educated on care plan and verbalizes understanding. Torrie Mayers RN Problem: Education: Goal: Knowledge of [place] [facility] Education information/materials will improve Outcome: Progressing   Problem: Coping: Goal: Ability to verbalize frustrations and anger appropriately will improve Outcome: Progressing Goal: Ability to demonstrate self-control will improve Outcome: Progressing   Problem: Safety: Goal: Periods of time without injury will increase Outcome: Progressing   Problem: Education: Goal: Will be free of psychotic symptoms Outcome: Progressing Goal: Knowledge of the prescribed therapeutic regimen will improve Outcome: Progressing   Problem: Self-Concept: Goal: Will verbalize positive feelings about self Outcome: Progressing   Problem: Education: Goal: Ability to state activities that reduce stress will improve Outcome: Progressing

## 2018-12-30 NOTE — B[facility] Group Notes (Signed)
B[facility] Group Notes:  (Nursing/MHT/Case Management/Adjunct)  Date:  12/30/2018  Time:  8:56 PM  Type of Therapy:  Group Therapy  Participation Level:  Active  Participation Quality:  Redirectable  Affect:  Appropriate  Cognitive:  Appropriate  Insight:  Good  Engagement in Group:  Engaged  Modes of Intervention:  Socialization  Summary of Progress/Problems:  Jackson [name] 12/30/2018, 8:56 PM

## 2018-12-30 NOTE — Progress Notes (Signed)
Recreation Therapy Notes   Date: 12/30/2018  Time: 9:30 am   Location: Craft room   Behavioral response: N/A   Intervention Topic: Time Management  Discussion/Intervention: Patient did not attend group.   Clinical Observations/Feedback:  Patient did not attend group.   Halana Deisher LRT/CTRS        Jackson [name] 12/30/2018 12:03 PM 

## 2018-12-31 NOTE — [facility] Group Notes (Signed)
[facility] Group Notes:  (Nursing/MHT/Case Management/Adjunct)  Date:  12/31/2018  Time:  3:28 PM  Type of Therapy:  Psychoeducational Skills  Participation Level:  None   Summary of Progress/Problems: Pt came to group but began to be disruptive by commenting after each person spoke and made noises. He was redirected and reminded of the group rules and expectations. Pt got up after approximately 5 minutes and left group.   Lynelle Smoke [facility] 12/31/2018, 3:28 PM

## 2018-12-31 NOTE — Progress Notes (Signed)
Recreation Therapy Notes  Date: 12/31/2018  Time: 9:30 am   Location: Craft room   Behavioral response: N/A   Intervention Topic: Leisure  Discussion/Intervention: Patient did not attend group.   Clinical Observations/Feedback:  Patient did not attend group.   Abriella Filkins LRT/CTRS        Jacoby Zanni 12/31/2018 11:39 AM

## 2018-12-31 NOTE — Progress Notes (Signed)
D - Patient was in his room upon arrival to the unit. Patient was pleasant during assessment and medication administration. Patient denies SI/HI/AVH, pain, anxiety and depression with this Clinical research associate.Patient believes that he can speed up and slow down time. Patient also stated to this writer, "Did I already give you eternal life?" Patient given education.   A - Patient was compliant with medication administration per MD orders and procedures on the unit. Patient given education. Patient given support and encouragement to be active in his treatment plan. Patient informed to let staff know if there are any issues or problems on the unit.   R - Patient being monitored Q 15 minutes for safety per unit protocol. Patient remains safe on the unit.

## 2018-12-31 NOTE — [facility] Group Notes (Signed)
LCSW Group Therapy Note  12/31/2018 12:32 PM  Type of Therapy and Topic:  Group Therapy:  Feelings around Relapse and Recovery  Participation Level:  Did Not Attend   Description of Group:    Patients in this group will discuss emotions they experience before and after a relapse. They will process how experiencing these feelings, or avoidance of experiencing them, relates to having a relapse. Facilitator will guide patients to explore emotions they have related to recovery. Patients will be encouraged to process which emotions are more powerful. They will be guided to discuss the emotional reaction significant others in their lives may have to their relapse or recovery. Patients will be assisted in exploring ways to respond to the emotions of others without this contributing to a relapse.  Therapeutic Goals: 1. Patient will identify two or more emotions that lead to a relapse for them 2. Patient will identify two emotions that result when they relapse 3. Patient will identify two emotions related to recovery 4. Patient will demonstrate ability to communicate their needs through discussion and/or role plays   Summary of Patient Progress: x    Therapeutic Modalities:   Cognitive Behavioral Therapy Solution-Focused Therapy Assertiveness Training Relapse Prevention Therapy   Iris Pert, MSW, LCSW Clinical Social Work 12/31/2018 12:32 PM

## 2018-12-31 NOTE — Tx Team (Signed)
Interdisciplinary Treatment and Diagnostic Plan Update  12/31/2018 Time of Session: 8:30AM  Jackson [name] MRN: [id number]  Principal Diagnosis: Schizoaffective disorder, bipolar type [employer] [facility])  Secondary Diagnoses: Principal Problem:   Schizoaffective disorder, bipolar type (HCC) Active Problems:   Noncompliance   Current Medications:  Current Facility-Administered Medications  Medication Dose Route Frequency Provider Last Rate Last Dose  . acetaminophen (TYLENOL) tablet 650 mg  650 mg Oral Q6H PRN [name], MD   650 mg at 10/26/18 2302  . alum & mag hydroxide-simeth (MAALOX/MYLANTA) 200-200-20 MG/5ML suspension 30 mL  30 mL Oral Q4H PRN [name], MD   30 mL at 10/26/18 2129  . ARIPiprazole ER (ABILIFY MAINTENA) injection 400 mg  400 mg Intramuscular Q28 days Charm Rings, NP   400 mg at 12/08/18 2217  . divalproex (DEPAKOTE ER) 24 hr tablet 1,500 mg  1,500 mg Oral QHS [name], [name], MD   1,500 mg at 12/30/18 2206  . hydrocerin (EUCERIN) cream   Topical BID [name], [name], MD      . hydrOXYzine (ATARAX/VISTARIL) tablet 50 mg  50 mg Oral Q4H PRN [name], MD   50 mg at 10/28/18 8264  . Influenza vac split quadrivalent PF (FLUARIX) injection 0.5 mL  0.5 mL Intramuscular Once Charm Rings, NP      . magnesium hydroxide (MILK OF MAGNESIA) suspension 30 mL  30 mL Oral Daily PRN [name], MD      . OLANZapine [facility]) injection 10 mg  10 mg Intramuscular Q6H PRN [name], [name], MD      . OLANZapine (ZYPREXA) tablet 30 mg  30 mg Oral QHS Charm Rings, NP   30 mg at 12/30/18 2206  . traZODone (DESYREL) tablet 100 mg  100 mg Oral QHS PRN [name], MD   100 mg at 11/26/18 2123   PTA Medications: No medications prior to admission.    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   Physician Treatment Plan for Primary Diagnosis: Schizoaffective disorder, bipolar type (HCC) Long Term Goal(s): Improvement in symptoms so as ready for discharge Improvement in symptoms so as ready for discharge   Short Term Goals: Ability to demonstrate self-control will improve Ability to identify and develop effective coping behaviors will improve Compliance with prescribed medications will improve  Medication Management: Evaluate patient's response, side effects, and tolerance of medication regimen.  Therapeutic Interventions: 1 to 1 Joung, Unit Group [place] and Medication administration.  Evaluation of Outcomes: Progressing  Physician Treatment Plan for Secondary Diagnosis: Principal Problem:   Schizoaffective disorder, bipolar type (HCC) Active Problems:   Noncompliance  Long Term Goal(s): Improvement in symptoms so as ready for discharge Improvement in symptoms so as ready for discharge   Short Term Goals: Ability to demonstrate self-control will improve Ability to identify and develop effective coping behaviors will improve Compliance with prescribed medications will improve     Medication Management: Evaluate patient's response, side effects, and tolerance of medication regimen.  Therapeutic Interventions: 1 to 1 Aymond, Unit Group [place] and Medication administration.  Evaluation of Outcomes: Progressing   RN Treatment Plan for Primary Diagnosis: Schizoaffective disorder, bipolar type (HCC) Long Term Goal(s): Knowledge of disease and therapeutic regimen to maintain health will improve  Short Term Goals: Ability to verbalize frustration and anger  appropriately will improve, Ability to demonstrate self-control, Ability to participate in decision making will improve and Ability to verbalize feelings will improve  Medication  Management: RN will administer medications as ordered by provider, will assess and evaluate patient's response and provide education to patient for prescribed medication. RN will report any adverse and/or side effects to prescribing provider.  Therapeutic Interventions: 1 on 1 counseling Flippen, Psychoeducation, Medication administration, Evaluate responses to treatment, Monitor vital signs and CBGs as ordered, Perform/monitor CIWA, COWS, AIMS and Fall Risk screenings as ordered, Perform wound care treatments as ordered.  Evaluation of Outcomes: Progressing   LCSW Treatment Plan for Primary Diagnosis: Schizoaffective disorder, bipolar type (HCC) Long Term Goal(s): Safe transition to appropriate next level of care at discharge, Engage patient in therapeutic group addressing interpersonal concerns.  Short Term Goals: Engage patient in aftercare planning with referrals and resources, Increase social support, Increase ability to appropriately verbalize feelings, Increase emotional regulation and Facilitate acceptance of mental health diagnosis and concerns  Therapeutic Interventions: Assess for all discharge needs, 1 to 1 time with Social worker, Explore available resources and support systems, Assess for adequacy in community support network, Educate family and significant other(s) on suicide prevention, Complete Psychosocial Assessment, Interpersonal group therapy.  Evaluation of Outcomes: Progressing   Progress in Treatment: Attending groups: Yes. Participating in groups: Yes. Taking medication as prescribed: Yes.   Toleration medication: Yes. Family/Significant other contact made: Yes, individual(s) contacted:  CSW completed SPE with the patients mother.  Patient understands diagnosis: Yes. Discussing patient identified problems/goals with staff: Yes. Medical problems stabilized or resolved: Yes. Denies suicidal/homicidal ideation: Yes. Issues/concerns per patient self-inventory:  No. Other: none  New problem(s) identified: No, Describe:  none  New Short Term/Long Term Goal(s): elimination of symptoms of psychosis, medication management for mood stabilization; elimination of SI thoughts; development of comprehensive mental wellness plan.  Patient Goals:  "independence"  Discharge Plan or Barriers:  Pt's mother remains the legal guardian at this time.  [facility]  Patient has a meeting scheduled with Social Security on 6/1.  This meeting is to determine if the patient should become his own payee.  Mother reports that she is unable to stop the meeting or schedule her own meeting until patient has had the opportunity to conclude his meeting.  Mother is unable to transfer funds at this time due to SSI declining to talk to her until the June 1st date.  Mother reports that all other paperwork for the group home has been completed. Mother reports that she is in the process of completing paperwork for ACTT with [employer].    Reason for Continuation of Hospitalization: Aggression Delusions  Hallucinations Mania Medication stabilization  Estimated Length of Stay: TBD  Recreational Therapy: Patient Stressors: N/A Patient Goal: Patient will focus on task/topic with 2 prompts from staff within 5 recreation therapy group [place]  Attendees: Patient:  12/31/2018 11:01 AM  Physician: Dr. Toni Amendlapacs, MD 12/31/2018 11:01 AM  Nursing:  12/31/2018 11:01 AM  RN Care Manager: 12/31/2018 11:01 AM  Social Worker: Penni HomansMichaela Bette Brienza, LCSW 12/31/2018 11:01 AM  Recreational Therapist:  12/31/2018 11:01 AM  Other:  12/31/2018 11:01 AM  Other:  12/31/2018 11:01 AM  Other: 12/31/2018 11:01 AM    Scribe for Treatment Team: Harden MoMichaela J Devere Brem, LCSW 12/31/2018 11:01 AM

## 2018-12-31 NOTE — Progress Notes (Signed)
[facility] MD Progress Note  12/31/2018 4:02 PM Jackson [name]  MRN:  [id number] Subjective: Follow-up patient with schizoaffective disorder.  Continues to in his private time ruminate on some odd thinking but in one-on-one conversation does not appear to be grossly psychotic.  More realistic overall about his treatment.  Affect calm and euthymic.  No sign of dangerousness.  Patient has a hearing of some sort on Monday about having his own checked given to him.  He is talking with his mother about possible discharge plans. Principal Problem: Schizoaffective disorder, bipolar type (HCC) Diagnosis: Principal Problem:   Schizoaffective disorder, bipolar type (HCC) Active Problems:   Noncompliance  Total Time spent with patient: 15 minutes  Past Psychiatric History: Long history of multiple hospitalizations bipolar schizoaffective disorder  Past Medical History:  Past Medical History:  Diagnosis Date  . Bipolar 1 disorder (HCC)   . Schizoaffective disorder (HCC)    History reviewed. No pertinent surgical history. Family History: History reviewed. No pertinent family history. Family Psychiatric  History: See previous Social History:  Social History   Substance and Sexual Activity  Alcohol Use Not on file     Social History   Substance and Sexual Activity  Drug Use Not on file    Social History   Socioeconomic History  . Marital status: Single    Spouse name: Not on file  . Number of children: Not on file  . Years of education: Not on file  . Highest education level: Not on file  Occupational History  . Not on file  Social Needs  . Financial resource strain: Not on file  . Food insecurity:    Worry: Not on file    Inability: Not on file  . Transportation needs:    Medical: Not on file    Non-medical: Not on file  Tobacco Use  . Smoking status: Light Tobacco Smoker    Packs/day: 0.25    Types: Cigarettes  . Smokeless tobacco: Never Used  Substance and Sexual Activity  .  Alcohol use: Not on file  . Drug use: Not on file  . Sexual activity: Not on file  Lifestyle  . Physical activity:    Days per week: Not on file    Minutes per session: Not on file  . Stress: Not on file  Relationships  . Social connections:    Talks on phone: Not on file    Gets together: Not on file    Attends religious service: Not on file    Active member of club or organization: Not on file    Attends meetings of clubs or organizations: Not on file    Relationship status: Not on file  Other Topics Concern  . Not on file  Social History Narrative  . Not on file   Additional Social History:                         Sleep: Fair  Appetite:  Fair  Current Medications: Current Facility-Administered Medications  Medication Dose Route Frequency Provider Last Rate Last Dose  . acetaminophen (TYLENOL) tablet 650 mg  650 mg Oral Q6H PRN [name], MD   650 mg at 10/26/18 2302  . alum & mag hydroxide-simeth (MAALOX/MYLANTA) 200-200-20 MG/5ML suspension 30 mL  30 mL Oral Q4H PRN [name], MD   30 mL at 10/26/18 2129  . ARIPiprazole ER (ABILIFY MAINTENA) injection 400 mg  400 mg Intramuscular Q28 days Charm Rings,  NP   400 mg at 12/08/18 2217  . divalproex (DEPAKOTE ER) 24 hr tablet 1,500 mg  1,500 mg Oral QHS Lyam Provencio T, MD   1,500 mg at 12/30/18 2206  . hydrocerin (EUCERIN) cream   Topical BID Ramya Vanbergen T, MD      . hydrOXYzine (ATARAX/VISTARIL) tablet 50 mg  50 mg Oral Q4H PRN Terance HartStephens, [name], MD   50 mg at 10/28/18 [id number]  . Influenza vac split quadrivalent PF (FLUARIX) injection 0.5 mL  0.5 mL Intramuscular Once Charm RingsLord, Jamison Y, NP      . magnesium hydroxide (MILK OF MAGNESIA) suspension 30 mL  30 mL Oral Daily PRN Terance HartStephens, [name], MD      . OLANZapine [facility](ZYPREXA) injection 10 mg  10 mg Intramuscular Q6H PRN Donis Kotowski T, MD      . OLANZapine (ZYPREXA) tablet 30 mg  30 mg Oral QHS Charm RingsLord, Jamison Y, NP   30 mg at 12/30/18 2206  .  traZODone (DESYREL) tablet 100 mg  100 mg Oral QHS PRN Terance HartStephens, [name], MD   100 mg at 11/26/18 2123    Lab Results: No results found for this or any previous visit (from the past 48 hour(s)).  Blood Alcohol level:  Lab Results  Component Value Date   ETH <10 10/16/2018    Metabolic Disorder Labs: Lab Results  Component Value Date   HGBA1C 5.2 10/23/2018   MPG 102.54 10/23/2018   No results found for: PROLACTIN Lab Results  Component Value Date   CHOL 129 10/23/2018   TRIG 58 10/23/2018   HDL 50 10/23/2018   CHOLHDL 2.6 10/23/2018   VLDL 12 10/23/2018   LDLCALC 67 10/23/2018    Physical Findings: AIMS: Facial and Oral Movements Muscles of Facial Expression: None, normal Lips and Perioral Area: None, normal Jaw: None, normal Tongue: None, normal,Extremity Movements Upper (arms, wrists, hands, fingers): None, normal Lower (legs, knees, ankles, toes): None, normal, Trunk Movements Neck, shoulders, hips: None, normal, Overall Severity Severity of abnormal movements (highest score from questions above): None, normal Incapacitation due to abnormal movements: None, normal Patient's awareness of abnormal movements (rate only patient's report): No Awareness, Dental Status Current problems with teeth and/or dentures?: No Does patient usually wear dentures?: No  CIWA:  CIWA-Ar Total: 0 COWS:  COWS Total Score: 0  Musculoskeletal: Strength & Muscle Tone: within normal limits Gait & Station: normal Patient leans: N/A  Psychiatric Specialty Exam: Physical Exam  Nursing note and vitals reviewed. Constitutional: He appears well-developed and well-nourished.  HENT:  Head: Normocephalic and atraumatic.  Eyes: Pupils are equal, round, and reactive to light. Conjunctivae are normal.  Neck: Normal range of motion.  Cardiovascular: Regular rhythm and normal heart sounds.  Respiratory: Effort normal. No respiratory distress.  GI: Soft.  Musculoskeletal: Normal range of  motion.  Neurological: He is alert.  Skin: Skin is warm and dry.  Psychiatric: He has a normal mood and affect. His speech is normal and behavior is normal. Thought content normal. Cognition and memory are normal. He expresses impulsivity.    Review of Systems  Constitutional: Negative.   HENT: Negative.   Eyes: Negative.   Respiratory: Negative.   Cardiovascular: Negative.   Gastrointestinal: Negative.   Musculoskeletal: Negative.   Skin: Negative.   Neurological: Negative.   Psychiatric/Behavioral: Negative for depression, hallucinations, memory loss, substance abuse and suicidal ideas. The patient is not nervous/anxious and does not have insomnia.     Blood pressure 115/75, pulse 70, temperature  97.8 F (36.6 C), temperature source Oral, resp. rate 16, height 5\' 11"  (1.803 m), weight 88.5 kg, SpO2 99 %.Body mass index is 27.2 kg/m.  General Appearance: Casual  Eye Contact:  Good  Speech:  Clear and Coherent  Volume:  Normal  Mood:  Euthymic  Affect:  Constricted  Thought Process:  Goal Directed  Orientation:  Full (Time, Place, and Person)  Thought Content:  Logical  Suicidal Thoughts:  No  Homicidal Thoughts:  No  Memory:  Immediate;   Fair Recent;   Fair Remote;   Fair  Judgement:  Fair  Insight:  Fair  Psychomotor Activity:  Normal  Concentration:  Concentration: Fair  Recall:  [employer] of Knowledge:  Fair  Language:  Fair  Akathisia:  No  Handed:  Right  AIMS (if indicated):     Assets:  Desire for Improvement Physical Health  ADL's:  Intact  Cognition:  Impaired,  Mild  Sleep:  Number of Hours: 4.5     Treatment Plan Summary: Daily contact with patient to assess and evaluate symptoms and progress in treatment, Medication management and Plan No change to medicine.  Looks like he is pretty stable on the Depakote.  Recheck a level probably in a few days.  Encourage patient to keep socializing and interacting appropriately.  We talked today again about his  options for living outside the hospital.  Patient and his mother sound like they may be having some productive conversations about boarding houses or other private living situations if he does not agree with going to a group home.  Mordecai Rasmussen, MD 12/31/2018, 4:02 PM

## 2018-12-31 NOTE — Plan of Care (Signed)
Patient still believes he can bend time and space. Patient believes he can see angels in the clouds.   Problem: Education: Goal: Will be free of psychotic symptoms Outcome: Not Progressing

## 2019-01-01 NOTE — Progress Notes (Signed)
D - Patient was in his room upon arrival to the unit. Patient was pleasant during assessment and medication administration. Patient denies SI/HI/AVH, pain, anxiety and depression with this Clinical research associate.Patient believes that he can alter time with his mind.  A - Patient was compliant with medication administration per MD orders and procedures on the unit. Patient given education. Patient given support and encouragement to be active in his treatment plan. Patient informed to let staff know if there are any issues or problems on the unit.   R - Patient being monitored Q 15 minutes for safety per unit protocol. Patient remains safe on the unit.

## 2019-01-01 NOTE — [facility] Group Notes (Signed)
LCSW Group Therapy Note  01/01/2019 1:15pm  Type of Therapy and Topic:  Group Therapy:  Cognitive Distortions  Participation Level:  Did Not Attend   Description of Group:    Patients in this group will be introduced to the topic of cognitive distortions.  Patients will identify and describe cognitive distortions, describe the feelings these distortions create for them.  Patients will identify one or more situations in their personal life where they have cognitively distorted thinking and will verbalize challenging this cognitive distortion through positive thinking skills.  Patients will practice the skill of using positive affirmations to challenge cognitive distortions using affirmation cards.    Therapeutic Goals:  1. Patient will identify two or more cognitive distortions they have used 2. Patient will identify one or more emotions that stem from use of a cognitive distortion 3. Patient will demonstrate use of a positive affirmation to counter a cognitive distortion through discussion and/or role play. 4. Patient will describe one way cognitive distortions can be detrimental to wellness   Summary of Patient Progress: Pt was invited to attend group but chose not to attend. CSW will continue to encourage pt to attend group throughout their admission.      Therapeutic Modalities:   Cognitive Behavioral Therapy Motivational Interviewing   Jaimie Redditt  [name], LCSW 01/01/2019 12:40 PM    

## 2019-01-01 NOTE — Plan of Care (Signed)
Patient still believes that he can alter time with his mind.   Problem: Education: Goal: Will be free of psychotic symptoms Outcome: Not Progressing

## 2019-01-01 NOTE — Progress Notes (Signed)
[facility] MD Progress Note  01/01/2019 12:20 PM Jackson [name]  MRN:  [id number] Subjective: Patient seen and chart reviewed.  Patient with schizoaffective disorder who has been stable recently.  Only new complaint is that he is tired during the day.  Denies hallucinations.  Denies suicidal or homicidal ideation. Principal Problem: Schizoaffective disorder, bipolar type (HCC) Diagnosis: Principal Problem:   Schizoaffective disorder, bipolar type (HCC) Active Problems:   Noncompliance  Total Time spent with patient: 15 minutes  Past Psychiatric History: See previous notes.  Multiple previous hospitalizations mostly for mania  Past Medical History:  Past Medical History:  Diagnosis Date  . Bipolar 1 disorder (HCC)   . Schizoaffective disorder (HCC)    History reviewed. No pertinent surgical history. Family History: History reviewed. No pertinent family history. Family Psychiatric  History: None known Social History:  Social History   Substance and Sexual Activity  Alcohol Use Not on file     Social History   Substance and Sexual Activity  Drug Use Not on file    Social History   Socioeconomic History  . Marital status: Single    Spouse name: Not on file  . Number of children: Not on file  . Years of education: Not on file  . Highest education level: Not on file  Occupational History  . Not on file  Social Needs  . Financial resource strain: Not on file  . Food insecurity:    Worry: Not on file    Inability: Not on file  . Transportation needs:    Medical: Not on file    Non-medical: Not on file  Tobacco Use  . Smoking status: Light Tobacco Smoker    Packs/day: 0.25    Types: Cigarettes  . Smokeless tobacco: Never Used  Substance and Sexual Activity  . Alcohol use: Not on file  . Drug use: Not on file  . Sexual activity: Not on file  Lifestyle  . Physical activity:    Days per week: Not on file    Minutes per session: Not on file  . Stress: Not on file   Relationships  . Social connections:    Talks on phone: Not on file    Gets together: Not on file    Attends religious service: Not on file    Active member of club or organization: Not on file    Attends meetings of clubs or organizations: Not on file    Relationship status: Not on file  Other Topics Concern  . Not on file  Social History Narrative  . Not on file   Additional Social History:                         Sleep: Good  Appetite:  Good  Current Medications: Current Facility-Administered Medications  Medication Dose Route Frequency Provider Last Rate Last Dose  . acetaminophen (TYLENOL) tablet 650 mg  650 mg Oral Q6H PRN [name], MD   650 mg at 10/26/18 2302  . alum & mag hydroxide-simeth (MAALOX/MYLANTA) 200-200-20 MG/5ML suspension 30 mL  30 mL Oral Q4H PRN [name], MD   30 mL at 10/26/18 2129  . ARIPiprazole ER (ABILIFY MAINTENA) injection 400 mg  400 mg Intramuscular Q28 days Charm Rings, NP   400 mg at 12/08/18 2217  . divalproex (DEPAKOTE ER) 24 hr tablet 1,500 mg  1,500 mg Oral QHS [name], [name], MD   1,500 mg at 12/31/18 2257  .  hydrocerin (EUCERIN) cream   Topical BID [name], [name], MD      . hydrOXYzine (ATARAX/VISTARIL) tablet 50 mg  50 mg Oral Q4H PRN Terance HartStephens, [name], MD   50 mg at 10/28/18 [id number]  . Influenza vac split quadrivalent PF (FLUARIX) injection 0.5 mL  0.5 mL Intramuscular Once Charm RingsLord, Jamison Y, NP      . magnesium hydroxide (MILK OF MAGNESIA) suspension 30 mL  30 mL Oral Daily PRN Terance HartStephens, [name], MD      . OLANZapine [facility](ZYPREXA) injection 10 mg  10 mg Intramuscular Q6H PRN [name], [name], MD      . OLANZapine (ZYPREXA) tablet 30 mg  30 mg Oral QHS Charm RingsLord, Jamison Y, NP   30 mg at 12/31/18 2258  . traZODone (DESYREL) tablet 100 mg  100 mg Oral QHS PRN Terance HartStephens, [name], MD   100 mg at 11/26/18 2123    Lab Results: No results found for this or any previous visit (from the past 48 hour(s)).  Blood  Alcohol level:  Lab Results  Component Value Date   ETH <10 10/16/2018    Metabolic Disorder Labs: Lab Results  Component Value Date   HGBA1C 5.2 10/23/2018   MPG 102.54 10/23/2018   No results found for: PROLACTIN Lab Results  Component Value Date   CHOL 129 10/23/2018   TRIG 58 10/23/2018   HDL 50 10/23/2018   CHOLHDL 2.6 10/23/2018   VLDL 12 10/23/2018   LDLCALC 67 10/23/2018    Physical Findings: AIMS: Facial and Oral Movements Muscles of Facial Expression: None, normal Lips and Perioral Area: None, normal Jaw: None, normal Tongue: None, normal,Extremity Movements Upper (arms, wrists, hands, fingers): None, normal Lower (legs, knees, ankles, toes): None, normal, Trunk Movements Neck, shoulders, hips: None, normal, Overall Severity Severity of abnormal movements (highest score from questions above): None, normal Incapacitation due to abnormal movements: None, normal Patient's awareness of abnormal movements (rate only patient's report): No Awareness, Dental Status Current problems with teeth and/or dentures?: No Does patient usually wear dentures?: No  CIWA:  CIWA-Ar Total: 0 COWS:  COWS Total Score: 0  Musculoskeletal: Strength & Muscle Tone: within normal limits Gait & Station: normal Patient leans: N/A  Psychiatric Specialty Exam: Physical Exam  Nursing note and vitals reviewed. Constitutional: He appears well-developed and well-nourished.  HENT:  Head: Normocephalic and atraumatic.  Eyes: Pupils are equal, round, and reactive to light. Conjunctivae are normal.  Neck: Normal range of motion.  Cardiovascular: Regular rhythm and normal heart sounds.  Respiratory: Effort normal.  GI: Soft.  Musculoskeletal: Normal range of motion.  Neurological: He is alert.  Skin: Skin is warm and dry.  Psychiatric: He has a normal mood and affect. His behavior is normal. Judgment and thought content normal.    Review of Systems  Constitutional: Negative.   HENT:  Negative.   Eyes: Negative.   Respiratory: Negative.   Cardiovascular: Negative.   Gastrointestinal: Negative.   Musculoskeletal: Negative.   Skin: Negative.   Neurological: Negative.   Psychiatric/Behavioral: Negative.     Blood pressure 112/78, pulse 80, temperature 97.8 F (36.6 C), temperature source Oral, resp. rate 18, height 5\' 11"  (1.803 m), weight 88.5 kg, SpO2 99 %.Body mass index is 27.2 kg/m.  General Appearance: Casual  Eye Contact:  Fair  Speech:  Clear and Coherent  Volume:  Normal  Mood:  Euthymic  Affect:  Congruent  Thought Process:  Goal Directed  Orientation:  Full (Time, Place, and Person)  Thought  Content:  Logical  Suicidal Thoughts:  No  Homicidal Thoughts:  No  Memory:  Immediate;   Fair Recent;   Fair Remote;   Fair  Judgement:  Fair  Insight:  Fair  Psychomotor Activity:  Normal  Concentration:  Concentration: Fair  Recall:  [employer] of Knowledge:  Fair  Language:  Fair  Akathisia:  No  Handed:  Right  AIMS (if indicated):     Assets:  Desire for Improvement Physical Health  ADL's:  Intact  Cognition:  WNL  Sleep:  Number of Hours: 4     Treatment Plan Summary: Daily contact with patient to assess and evaluate symptoms and progress in treatment, Medication management and Plan Patient is back to not sleeping so well at night and then being sleepy during the day.  I do not think he is really oversedated.  Patient is stable on his current medication without any sign of acute dangerousness.  As mentioned many times before we are mostly waiting on a discharge plan which is currently being delayed by the fact that he has a hearing with social services on Monday.  Supportive counseling and review of plan with patient.  Mordecai Rasmussen, MD 01/01/2019, 12:20 PM

## 2019-01-01 NOTE — Plan of Care (Signed)
Pt denies SI,  HI,  AVH, depression and anxiety, Pt stated he slept "pretty good". Pt has a goal "to have a good day". Pt has been educated on care plan and verbalizes understanding. Torrie Mayers RN Problem: Education: Goal: Knowledge of [place] [facility] Education information/materials will improve Outcome: Progressing   Problem: Coping: Goal: Ability to verbalize frustrations and anger appropriately will improve Outcome: Progressing Goal: Ability to demonstrate self-control will improve Outcome: Progressing   Problem: Safety: Goal: Periods of time without injury will increase Outcome: Progressing   Problem: Education: Goal: Will be free of psychotic symptoms Outcome: Progressing Goal: Knowledge of the prescribed therapeutic regimen will improve Outcome: Progressing   Problem: Self-Concept: Goal: Will verbalize positive feelings about self Outcome: Progressing   Problem: Education: Goal: Ability to state activities that reduce stress will improve Outcome: Progressing

## 2019-01-02 NOTE — Progress Notes (Signed)
Pastoral Care Visit   01/02/19 1900  Clinical Encounter Type  Visited With Patient  Visit Type Follow-up;Psychological support  Referral From Patient  Consult/Referral To Chaplain   Pt saw chap and requested to visit.  Pt asked to meet chap in his room. Pt was eager to share new music he had written and a drawing he had completed. Pt was cheerful and engaged.  Pt shared about concerns of securing his check to be sent here as he was unable to get the mailing address form prev group home.  Pt also shared that he is excited to hopefully be leaving on Thur to go to a boarding house.  He is happy to not be going to a group home.  Chap cut visit short and pt was very understanding.  Milinda Antis, [address]

## 2019-01-02 NOTE — Progress Notes (Signed)
[facility]  01/02/2019 10:14 AM Landry Mellowerrell Fickett  MRN:  [id number] Subjective: No new complaints.  Behavior stable.  No suicidal ideation.  Still has odd believes and some disorganization in thinking but basically able to take care of ADLs. Principal Problem: Schizoaffective disorder, bipolar type (HCC) Diagnosis: Principal Problem:   Schizoaffective disorder, bipolar type (HCC) Active Problems:   Noncompliance  Total Time spent with patient: 20 minutes  Past Psychiatric History: Past history of recurrent bipolar or schizoaffective disorders.  Past Medical History:  Past Medical History:  Diagnosis Date  . Bipolar 1 disorder (HCC)   . Schizoaffective disorder (HCC)    History reviewed. No pertinent surgical history. Family History: History reviewed. No pertinent family history. Family Psychiatric  History: See previous Social History:  Social History   Substance and Sexual Activity  Alcohol Use Not on file     Social History   Substance and Sexual Activity  Drug Use Not on file    Social History   Socioeconomic History  . Marital status: Single    Spouse name: Not on file  . Number of children: Not on file  . Years of education: Not on file  . Highest education level: Not on file  Occupational History  . Not on file  Social Needs  . Financial resource strain: Not on file  . Food insecurity:    Worry: Not on file    Inability: Not on file  . Transportation needs:    Medical: Not on file    Non-medical: Not on file  Tobacco Use  . Smoking status: Light Tobacco Smoker    Packs/day: 0.25    Types: Cigarettes  . Smokeless tobacco: Never Used  Substance and Sexual Activity  . Alcohol use: Not on file  . Drug use: Not on file  . Sexual activity: Not on file  Lifestyle  . Physical activity:    Days per week: Not on file    Minutes per session: Not on file  . Stress: Not on file  Relationships  . Social connections:    Talks on phone: Not on file     Gets together: Not on file    Attends religious service: Not on file    Active member of club or organization: Not on file    Attends meetings of clubs or organizations: Not on file    Relationship status: Not on file  Other Topics Concern  . Not on file  Social History Narrative  . Not on file   Additional Social History:                         Sleep: Fair  Appetite:  Fair  Current Medications: Current Facility-Administered Medications  Medication Dose Route Frequency Provider Last Rate Last Dose  . acetaminophen (TYLENOL) tablet 650 mg  650 mg Oral Q6H PRN Terance HartStephens, [name], MD   650 mg at 10/26/18 2302  . alum & mag hydroxide-simeth (MAALOX/MYLANTA) 200-200-20 MG/5ML suspension 30 mL  30 mL Oral Q4H PRN Terance HartStephens, [name], MD   30 mL at 10/26/18 2129  . ARIPiprazole ER (ABILIFY MAINTENA) injection 400 mg  400 mg Intramuscular Q28 days Charm RingsLord, Jamison Y, NP   400 mg at 12/08/18 2217  . divalproex (DEPAKOTE ER) 24 hr tablet 1,500 mg  1,500 mg Oral QHS Doris Mcgilvery T, MD   1,500 mg at 01/01/19 2216  . hydrocerin (EUCERIN) cream   Topical BID Giomar Gusler, Jackquline DenmarkJohn T,  MD      . hydrOXYzine (ATARAX/VISTARIL) tablet 50 mg  50 mg Oral Q4H PRN [name], MD   50 mg at 10/28/18 1610  . Influenza vac split quadrivalent PF (FLUARIX) injection 0.5 mL  0.5 mL Intramuscular Once Charm Rings, NP      . magnesium hydroxide (MILK OF MAGNESIA) suspension 30 mL  30 mL Oral Daily PRN [name], MD      . OLANZapine [facility]) injection 10 mg  10 mg Intramuscular Q6H PRN Moraima Burd T, MD      . OLANZapine (ZYPREXA) tablet 30 mg  30 mg Oral QHS Charm Rings, NP   30 mg at 01/01/19 2217  . traZODone (DESYREL) tablet 100 mg  100 mg Oral QHS PRN [name], MD   100 mg at 11/26/18 2123    Lab Results: No results found for this or any previous visit (from the past 48 hour(s)).  Blood Alcohol level:  Lab Results  Component Value Date   ETH <10 10/16/2018     Metabolic Disorder Labs: Lab Results  Component Value Date   HGBA1C 5.2 10/23/2018   MPG 102.54 10/23/2018   No results found for: PROLACTIN Lab Results  Component Value Date   CHOL 129 10/23/2018   TRIG 58 10/23/2018   HDL 50 10/23/2018   CHOLHDL 2.6 10/23/2018   VLDL 12 10/23/2018   LDLCALC 67 10/23/2018    Physical Findings: AIMS: Facial and Oral Movements Muscles of Facial Expression: None, normal Lips and Perioral Area: None, normal Jaw: None, normal Tongue: None, normal,Extremity Movements Upper (arms, wrists, hands, fingers): None, normal Lower (legs, knees, ankles, toes): None, normal, Trunk Movements Neck, shoulders, hips: None, normal, Overall Severity Severity of abnormal movements (highest score from questions above): None, normal Incapacitation due to abnormal movements: None, normal Patient's awareness of abnormal movements (rate only patient's report): No Awareness, Dental Status Current problems with teeth and/or dentures?: No Does patient usually wear dentures?: No  CIWA:  CIWA-Ar Total: 0 COWS:  COWS Total Score: 0  Musculoskeletal: Strength & Muscle Tone: within normal limits Gait & Station: normal Patient leans: N/A  Psychiatric Specialty Exam: Physical Exam  Nursing note and vitals reviewed. Constitutional: He appears well-developed and well-nourished.  HENT:  Head: Normocephalic and atraumatic.  Eyes: Pupils are equal, round, and reactive to light. Conjunctivae are normal.  Neck: Normal range of motion.  Cardiovascular: Regular rhythm and normal heart sounds.  Respiratory: Effort normal. No respiratory distress.  GI: Soft.  Musculoskeletal: Normal range of motion.  Neurological: He is alert.  Skin: Skin is warm and dry.  Psychiatric: He has a normal mood and affect. His behavior is normal. Judgment and thought content normal.    Review of Systems  Constitutional: Negative.   HENT: Negative.   Eyes: Negative.   Respiratory:  Negative.   Cardiovascular: Negative.   Gastrointestinal: Negative.   Musculoskeletal: Negative.   Skin: Negative.   Neurological: Negative.   Psychiatric/Behavioral: Negative.     Blood pressure 98/70, pulse 78, temperature 97.6 F (36.4 C), temperature source Oral, resp. rate 18, height  (1.803 m), weight 88.5 kg, SpO2 99 %.Body mass index is 27.2 kg/m.  General Appearance: Casual  Eye Contact:  Fair  Speech:  Clear and Coherent  Volume:  Decreased  Mood:  Euthymic  Affect:  Constricted  Thought Process:  Goal Directed  Orientation:  Full (Time, Place, and Person)  Thought Content:  Logical  Suicidal Thoughts:  No  Homicidal Thoughts:  No  Memory:  Immediate;   Fair Recent;   Fair Remote;   Fair  Judgement:  Fair  Insight:  Fair  Psychomotor Activity:  Decreased  Concentration:  Concentration: Fair  Recall:  [employer] of Knowledge:  Fair  Language:  Fair  Akathisia:  No  Handed:  Right  AIMS (if indicated):     Assets:  Desire for Improvement Physical Health Resilience  ADL's:  Intact  Cognition:  WNL  Sleep:  Number of Hours: 3     Treatment Plan Summary: Daily contact with patient to assess and evaluate symptoms and progress in treatment, Medication management and Plan Patient appears to still be stable.  We are still mostly working on discharge planning.  Seems a little more sedated.  I will address his labs and if it looks like anything needs to be rechecked such as the Depakote level to consider readjustment we will do that.  Patient has a appointment to talk to social services tomorrow.  Mordecai Rasmussen, MD 01/02/2019, 10:14 AM

## 2019-01-02 NOTE — [facility] Group Notes (Signed)
LCSW Group Therapy Note 01/02/2019 1:15pm  Type of Therapy and Topic: Group Therapy: Feelings Around Returning Home & Establishing a Supportive Framework and Supporting Oneself When Supports Not Available  Participation Level: Minimal  Description of Group:  Patients first processed thoughts and feelings about upcoming discharge. These included fears of upcoming changes, lack of change, new living environments, judgements and expectations from others and overall stigma of mental health issues. The group then discussed the definition of a supportive framework, what that looks and feels like, and how do to discern it from an unhealthy non-supportive network. The group identified different types of supports as well as what to do when your family/friends are less than helpful or unavailable  Therapeutic Goals  1. Patient will identify one healthy supportive network that they can use at discharge. 2. Patient will identify one factor of a supportive framework and how to tell it from an unhealthy network. 3. Patient able to identify one coping skill to use when they do not have positive supports from others. 4. Patient will demonstrate ability to communicate their needs through discussion and/or role plays.  Summary of Patient Progress:  The patient arrived to group 5 minutes before the end, he stated he felt well and ready to be discharge.   Therapeutic Modalities Cognitive Behavioral Therapy Motivational Interviewing   Jackson [name]  [name], LCSW 01/02/2019 10:33 AM

## 2019-01-02 NOTE — Plan of Care (Signed)
Patient observed with improved emotional and psychological behavior. Observed to interact appropriately`with other patients including group participation. Taking medications as ordered. Denies audiovisual hallucinations and confusion. Behavioral pattern remarkably improved since admission. Problem: Education: Goal: Knowledge of [place] [facility] Education information/materials will improve Outcome: Progressing   Problem: Coping: Goal: Ability to verbalize frustrations and anger appropriately will improve Outcome: Progressing Goal: Ability to demonstrate self-control will improve Outcome: Progressing   Problem: Safety: Goal: Periods of time without injury will increase Outcome: Progressing   Problem: Education: Goal: Will be free of psychotic symptoms Outcome: Progressing Goal: Knowledge of the prescribed therapeutic regimen will improve Outcome: Progressing

## 2019-01-02 NOTE — Progress Notes (Signed)
D- Patient alert and oriented. Affect/mood much improved.. Denies SI, HI, AVH, and pain. "Been here 78 days it's time to try something else". Goal: get independent home.. Previous goals met inadequately thus far, although ability to interact appropriately with others has improved. A- Scheduled medications administered to patient, per MD orders. Support and encouragement provided.  Routine safety checks conducted every 15 minutes.  Patient informed to notify staff with problems or concerns. R- No adverse drug reactions noted. Patient contracts for safety at this time. Patient compliant with medications and treatment plan. Patient receptive, calm, and cooperative.  Patient remains safe at this time.

## 2019-01-02 NOTE — [facility] Group Notes (Signed)
[facility] Group Notes:  (Nursing/MHT/Case Management/Adjunct)  Date:  01/02/2019  Time:  9:44 PM  Type of Therapy:  Group Therapy  Participation Level:  Did Not Attend   Lacey Dotson L Raynah Gomes 01/02/2019, 9:44 PM 

## 2019-01-02 NOTE — Plan of Care (Signed)
D- Patient alert and oriented. Patient presents in a pleasant mood on assessment reporting no issues with his sleep last night nor any complaints or concerns to voice to this Clinical research associate. Patient also denies SI, HI, AVH, and pain at this time. Patient's goal for today is to get some of his personal information gathered together for his meeting with Social Services tomorrow.  A- Support and encouragement provided. Routine safety checks conducted every 15 minutes.  Patient informed to notify staff with problems or concerns.  R- Patient contracts for safety at this time. Patient compliant with medications and treatment plan. Patient receptive, calm, and cooperative. Patient interacts well with others on the unit.  Patient remains safe at this time.  Problem: Education: Goal: Knowledge of [place] [facility] Education information/materials will improve Outcome: Progressing   Problem: Coping: Goal: Ability to verbalize frustrations and anger appropriately will improve Outcome: Progressing Goal: Ability to demonstrate self-control will improve Outcome: Progressing   Problem: Education: Goal: Will be free of psychotic symptoms Outcome: Progressing Goal: Knowledge of the prescribed therapeutic regimen will improve Outcome: Progressing   Problem: Self-Concept: Goal: Will verbalize positive feelings about self Outcome: Progressing   Problem: Education: Goal: Ability to state activities that reduce stress will improve Outcome: Progressing

## 2019-01-03 LAB — VALPROIC ACID LEVEL: Valproic Acid Lvl: 66 ug/mL (ref 50.0–100.0)

## 2019-01-03 NOTE — Progress Notes (Signed)
[facility]  01/03/2019 3:39 PM Jackson [name]  MRN:  [id number] Subjective: Patient seen and chart reviewed.  Patient remains stable.  He had his phone call today to talk with someone about getting his payee status change.  Even after I talk with him I am unclear about what the actual outcome of that was.  It sounds like they are still thinking about it and gathering data.  Patient is more sedated and not as interactive recently although it sounds like in the evening he still comes out and at times is getting into fusses with other patients.  Denies any suicidal or homicidal thoughts however and has not been violent or acutely dangerous Principal Problem: Schizoaffective disorder, bipolar type (HCC) Diagnosis: Principal Problem:   Schizoaffective disorder, bipolar type (HCC) Active Problems:   Noncompliance  Total Time spent with patient: 30 minutes  Past Psychiatric History: Long history of bipolar disorder multiple previous hospitalizations  Past Medical History:  Past Medical History:  Diagnosis Date  . Bipolar 1 disorder (HCC)   . Schizoaffective disorder (HCC)    History reviewed. No pertinent surgical history. Family History: History reviewed. No pertinent family history. Family Psychiatric  History: See previous Social History:  Social History   Substance and Sexual Activity  Alcohol Use Not on file     Social History   Substance and Sexual Activity  Drug Use Not on file    Social History   Socioeconomic History  . Marital status: Single    Spouse name: Not on file  . Number of children: Not on file  . Years of education: Not on file  . Highest education level: Not on file  Occupational History  . Not on file  Social Needs  . Financial resource strain: Not on file  . Food insecurity:    Worry: Not on file    Inability: Not on file  . Transportation needs:    Medical: Not on file    Non-medical: Not on file  Tobacco Use  . Smoking status: Light  Tobacco Smoker    Packs/day: 0.25    Types: Cigarettes  . Smokeless tobacco: Never Used  Substance and Sexual Activity  . Alcohol use: Not on file  . Drug use: Not on file  . Sexual activity: Not on file  Lifestyle  . Physical activity:    Days per week: Not on file    Minutes per session: Not on file  . Stress: Not on file  Relationships  . Social connections:    Talks on phone: Not on file    Gets together: Not on file    Attends religious service: Not on file    Active member of club or organization: Not on file    Attends meetings of clubs or organizations: Not on file    Relationship status: Not on file  Other Topics Concern  . Not on file  Social History Narrative  . Not on file   Additional Social History:                         Sleep: Fair  Appetite:  Fair  Current Medications: Current Facility-Administered Medications  Medication Dose Route Frequency Provider Last Rate Last Dose  . acetaminophen (TYLENOL) tablet 650 mg  650 mg Oral Q6H PRN Terance HartStephens, [name], MD   650 mg at 10/26/18 2302  . alum & mag hydroxide-simeth (MAALOX/MYLANTA) 200-200-20 MG/5ML suspension 30 mL  30 mL Oral Q4H  PRN [name], MD   30 mL at 10/26/18 2129  . ARIPiprazole ER (ABILIFY MAINTENA) injection 400 mg  400 mg Intramuscular Q28 days Charm Rings, NP   400 mg at 12/08/18 2217  . divalproex (DEPAKOTE ER) 24 hr tablet 1,500 mg  1,500 mg Oral QHS ,  T, MD   1,500 mg at 01/02/19 2244  . hydrocerin (EUCERIN) cream   Topical BID ,  T, MD      . hydrOXYzine (ATARAX/VISTARIL) tablet 50 mg  50 mg Oral Q4H PRN [name], MD   50 mg at 10/28/18 1610  . Influenza vac split quadrivalent PF (FLUARIX) injection 0.5 mL  0.5 mL Intramuscular Once Charm Rings, NP      . magnesium hydroxide (MILK OF MAGNESIA) suspension 30 mL  30 mL Oral Daily PRN [name], MD      . OLANZapine [facility]) injection 10 mg  10 mg Intramuscular Q6H PRN  ,  T, MD      . OLANZapine (ZYPREXA) tablet 30 mg  30 mg Oral QHS Charm Rings, NP   30 mg at 01/02/19 2243  . traZODone (DESYREL) tablet 100 mg  100 mg Oral QHS PRN [name], MD   100 mg at 01/02/19 2244    Lab Results:  Results for orders placed or performed during the hospital encounter of 10/17/18 (from the past 48 hour(s))  Valproic acid level     Status: None   Collection Time: 01/03/19  6:39 AM  Result Value Ref Range   Valproic Acid Lvl 66 50.0 - 100.0 ug/mL    Comment: Performed at [facility], [address] [place], [place] [address]    Blood Alcohol level:  Lab Results  Component Value Date   [facility] <10 10/16/2018    Metabolic Disorder Labs: Lab Results  Component Value Date   HGBA1C 5.2 10/23/2018   MPG 102.54 10/23/2018   No results found for: PROLACTIN Lab Results  Component Value Date   CHOL 129 10/23/2018   TRIG 58 10/23/2018   HDL 50 10/23/2018   CHOLHDL 2.6 10/23/2018   VLDL 12 10/23/2018   LDLCALC 67 10/23/2018    Physical Findings: AIMS: Facial and Oral Movements Muscles of Facial Expression: None, normal Lips and Perioral Area: None, normal Jaw: None, normal Tongue: None, normal,Extremity Movements Upper (arms, wrists, hands, fingers): None, normal Lower (legs, knees, ankles, toes): None, normal, Trunk Movements Neck, shoulders, hips: None, normal, Overall Severity Severity of abnormal movements (highest score from questions above): None, normal Incapacitation due to abnormal movements: None, normal Patient's awareness of abnormal movements (rate only patient's report): No Awareness, Dental Status Current problems with teeth and/or dentures?: No Does patient usually wear dentures?: No  CIWA:  CIWA-Ar Total: 0 COWS:  COWS Total Score: 0  Musculoskeletal: Strength & Muscle Tone: within normal limits Gait & Station: normal Patient leans: N/A  Psychiatric Specialty Exam: Physical Exam  Nursing note and  vitals reviewed. Constitutional: He appears well-developed and well-nourished.  HENT:  Head: Normocephalic and atraumatic.  Eyes: Pupils are equal, round, and reactive to light. Conjunctivae are normal.  Neck: Normal range of motion.  Cardiovascular: Regular rhythm and normal heart sounds.  Respiratory: Effort normal.  GI: Soft.  Musculoskeletal: Normal range of motion.  Neurological: He is alert.  Skin: Skin is warm and dry.  Psychiatric: His affect is blunt. His speech is delayed. He is slowed. He expresses impulsivity. He expresses no suicidal ideation.  He exhibits abnormal recent memory.    Review of Systems  Constitutional: Negative.   HENT: Negative.   Eyes: Negative.   Respiratory: Negative.   Cardiovascular: Negative.   Gastrointestinal: Negative.   Musculoskeletal: Negative.   Skin: Negative.   Neurological: Negative.   Psychiatric/Behavioral: Negative for depression, hallucinations, memory loss, substance abuse and suicidal ideas. The patient is not nervous/anxious and does not have insomnia.     Blood pressure 94/73, pulse 76, temperature 97.8 F (36.6 C), temperature source Oral, resp. rate 18, height 5\' 11"  (1.803 m), weight 88.5 kg, SpO2 98 %.Body mass index is 27.2 kg/m.  General Appearance: Casual  Eye Contact:  Fair  Speech:  Slow  Volume:  Decreased  Mood:  Euthymic  Affect:  Constricted  Thought Process:  Coherent  Orientation:  Full (Time, Place, and Person)  Thought Content:  Logical  Suicidal Thoughts:  No  Homicidal Thoughts:  No  Memory:  Immediate;   Fair Recent;   Fair Remote;   Fair  Judgement:  Fair  Insight:  Fair  Psychomotor Activity:  Decreased  Concentration:  Concentration: Fair  Recall:  [employer] of Knowledge:  Fair  Language:  Fair  Akathisia:  No  Handed:  Right  AIMS (if indicated):     Assets:  Desire for Improvement Physical Health Resilience Social Support  ADL's:  Intact  Cognition:  WNL  Sleep:  Number of Hours: 4      Treatment Plan Summary: Daily contact with patient to assess and evaluate symptoms and progress in treatment, Medication management and Plan Patient appears to still be stable.  Probably has some residual psychotic symptoms but his behavior is under control.  We have been working hard on trying to find a group home for him with no progress and then this whole issue about changing his payee status has put a pause on everything.  His commitment still has a couple of weeks before it runs out and I am hoping we will still be able to find some kind of discharge plan before then.  Mordecai Rasmussen, MD 01/03/2019, 3:39 PM

## 2019-01-03 NOTE — Plan of Care (Signed)
Patient is alert and oriented X 4, denies SI, HI and AVH. Patient is appropriate on the unit, focused on becoming his own guardian, making telephone calls. Patient denies pain 0/10. Patient is pleasant and cooperative. Patient interacts well with peers on the unit . Safety checks will continue Q 15 minutes. Problem: Education: Goal: Knowledge of [facility] Education information/materials will improve Outcome: Progressing   Problem: Coping: Goal: Ability to verbalize frustrations and anger appropriately will improve Outcome: Progressing Goal: Ability to demonstrate self-control will improve Outcome: Progressing   Problem: Safety: Goal: Periods of time without injury will increase Outcome: Progressing

## 2019-01-03 NOTE — BHH Group Notes (Signed)
Overcoming Obstacles  01/03/2019 1PM  Type of Therapy and Topic:  Group Therapy:  Overcoming Obstacles  Participation Level:  Did Not Attend    Description of Group:    In this group patients will be encouraged to explore what they see as obstacles to their own wellness and recovery. They will be guided to discuss their thoughts, feelings, and behaviors related to these obstacles. The group will process together ways to cope with barriers, with attention given to specific choices patients can make. Each patient will be challenged to identify changes they are motivated to make in order to overcome their obstacles. This group will be process-oriented, with patients participating in exploration of their own experiences as well as giving and receiving support and challenge from other group members.   Therapeutic Goals: 1. Patient will identify personal and current obstacles as they relate to admission. 2. Patient will identify barriers that currently interfere with their wellness or overcoming obstacles.  3. Patient will identify feelings, thought process and behaviors related to these barriers. 4. Patient will identify two changes they are willing to make to overcome these obstacles:      Summary of Patient Progress     Therapeutic Modalities:   Cognitive Behavioral Therapy Solution Focused Therapy Motivational Interviewing Relapse Prevention Therapy    Lowella Dandy, MSW, LCSW 01/03/2019 2:32 PM

## 2019-01-04 NOTE — Progress Notes (Signed)
[facility] MD Progress Note  01/04/2019 5:40 PM Jackson [name]  MRN:  [id number] Subjective: Patient seen.  No new complaints.  Mood and affect stable.  Apparently working with his mom on some kind of plans to go to a boardinghouse or something.  No physical complaints Principal Problem: Schizoaffective disorder, bipolar type (HCC) Diagnosis: Principal Problem:   Schizoaffective disorder, bipolar type (HCC) Active Problems:   Noncompliance  Total Time spent with patient: 15 minutes  Past Psychiatric History: Past history of bipolar and schizoaffective disorder  Past Medical History:  Past Medical History:  Diagnosis Date  . Bipolar 1 disorder (HCC)   . Schizoaffective disorder (HCC)    History reviewed. No pertinent surgical history. Family History: History reviewed. No pertinent family history. Family Psychiatric  History: None reported Social History:  Social History   Substance and Sexual Activity  Alcohol Use Not on file     Social History   Substance and Sexual Activity  Drug Use Not on file    Social History   Socioeconomic History  . Marital status: Single    Spouse name: Not on file  . Number of children: Not on file  . Years of education: Not on file  . Highest education level: Not on file  Occupational History  . Not on file  Social Needs  . Financial resource strain: Not on file  . Food insecurity:    Worry: Not on file    Inability: Not on file  . Transportation needs:    Medical: Not on file    Non-medical: Not on file  Tobacco Use  . Smoking status: Light Tobacco Smoker    Packs/day: 0.25    Types: Cigarettes  . Smokeless tobacco: Never Used  Substance and Sexual Activity  . Alcohol use: Not on file  . Drug use: Not on file  . Sexual activity: Not on file  Lifestyle  . Physical activity:    Days per week: Not on file    Minutes per session: Not on file  . Stress: Not on file  Relationships  . Social connections:    Talks on phone: Not on file     Gets together: Not on file    Attends religious service: Not on file    Active member of club or organization: Not on file    Attends meetings of clubs or organizations: Not on file    Relationship status: Not on file  Other Topics Concern  . Not on file  Social History Narrative  . Not on file   Additional Social History:                         Sleep: Fair  Appetite:  Fair  Current Medications: Current Facility-Administered Medications  Medication Dose Route Frequency Provider Last Rate Last Dose  . acetaminophen (TYLENOL) tablet 650 mg  650 mg Oral Q6H PRN [name], MD   650 mg at 10/26/18 2302  . alum & mag hydroxide-simeth (MAALOX/MYLANTA) 200-200-20 MG/5ML suspension 30 mL  30 mL Oral Q4H PRN [name], MD   30 mL at 10/26/18 2129  . ARIPiprazole ER (ABILIFY MAINTENA) injection 400 mg  400 mg Intramuscular Q28 days Charm Rings, NP   400 mg at 12/08/18 2217  . divalproex (DEPAKOTE ER) 24 hr tablet 1,500 mg  1,500 mg Oral QHS [name], [name], MD   1,500 mg at 01/03/19 2257  . hydrocerin (EUCERIN) cream   Topical  BID [name], [name], MD      . hydrOXYzine (ATARAX/VISTARIL) tablet 50 mg  50 mg Oral Q4H PRN [name], MD   50 mg at 10/28/18 1610  . Influenza vac split quadrivalent PF (FLUARIX) injection 0.5 mL  0.5 mL Intramuscular Once Charm Rings, NP      . magnesium hydroxide (MILK OF MAGNESIA) suspension 30 mL  30 mL Oral Daily PRN [name], MD      . OLANZapine [facility]) injection 10 mg  10 mg Intramuscular Q6H PRN [name], [name], MD      . OLANZapine (ZYPREXA) tablet 30 mg  30 mg Oral QHS Charm Rings, NP   30 mg at 01/03/19 2257  . traZODone (DESYREL) tablet 100 mg  100 mg Oral QHS PRN [name], MD   100 mg at 01/02/19 2244    Lab Results:  Results for orders placed or performed during the hospital encounter of 10/17/18 (from the past 48 hour(s))  Valproic acid level     Status: None    Collection Time: 01/03/19  6:39 AM  Result Value Ref Range   Valproic Acid Lvl 66 50.0 - 100.0 ug/mL    Comment: Performed at [name], [address] [place], [place] [address]    Blood Alcohol level:  Lab Results  Component Value Date   [facility] <10 10/16/2018    Metabolic Disorder Labs: Lab Results  Component Value Date   HGBA1C 5.2 10/23/2018   MPG 102.54 10/23/2018   No results found for: PROLACTIN Lab Results  Component Value Date   CHOL 129 10/23/2018   TRIG 58 10/23/2018   HDL 50 10/23/2018   CHOLHDL 2.6 10/23/2018   VLDL 12 10/23/2018   LDLCALC 67 10/23/2018    Physical Findings: AIMS: Facial and Oral Movements Muscles of Facial Expression: None, normal Lips and Perioral Area: None, normal Jaw: None, normal Tongue: None, normal,Extremity Movements Upper (arms, wrists, hands, fingers): None, normal Lower (legs, knees, ankles, toes): None, normal, Trunk Movements Neck, shoulders, hips: None, normal, Overall Severity Severity of abnormal movements (highest score from questions above): None, normal Incapacitation due to abnormal movements: None, normal Patient's awareness of abnormal movements (rate only patient's report): No Awareness, Dental Status Current problems with teeth and/or dentures?: No Does patient usually wear dentures?: No  CIWA:  CIWA-Ar Total: 0 COWS:  COWS Total Score: 0  Musculoskeletal: Strength & Muscle Tone: within normal limits Gait & Station: normal Patient leans: N/A  Psychiatric Specialty Exam: Physical Exam  Nursing note and vitals reviewed. Constitutional: He appears well-developed and well-nourished.  HENT:  Head: Normocephalic and atraumatic.  Eyes: Pupils are equal, round, and reactive to light. Conjunctivae are normal.  Neck: Normal range of motion.  Cardiovascular: Regular rhythm and normal heart sounds.  Respiratory: Effort normal. No respiratory distress.  GI: Soft.  Musculoskeletal: Normal range of  motion.  Neurological: He is alert.  Skin: Skin is warm and dry.  Psychiatric: He has a normal mood and affect. His behavior is normal. Judgment and thought content normal.    Review of Systems  Constitutional: Negative.   HENT: Negative.   Eyes: Negative.   Respiratory: Negative.   Cardiovascular: Negative.   Gastrointestinal: Negative.   Musculoskeletal: Negative.   Skin: Negative.   Neurological: Negative.   Psychiatric/Behavioral: Negative.     Blood pressure 105/73, pulse 89, temperature 98.2 F (36.8 C), temperature source Oral, resp. rate 18, height  (1.803 m), weight 88.5  kg, SpO2 100 %.Body mass index is 27.2 kg/m.  General Appearance: Negative and Casual  Eye Contact:  Fair  Speech:  Slow  Volume:  Decreased  Mood:  Euthymic  Affect:  Constricted  Thought Process:  Coherent  Orientation:  Full (Time, Place, and Person)  Thought Content:  Illogical  Suicidal Thoughts:  No  Homicidal Thoughts:  No  Memory:  Immediate;   Fair Recent;   Fair Remote;   Fair  Judgement:  Fair  Insight:  Fair  Psychomotor Activity:  Decreased  Concentration:  Concentration: Fair  Recall:  [employer]  Fund of Knowledge:  Fair  Language:  Fair  Akathisia:  No  Handed:  Right  AIMS (if indicated):     Assets:  Desire for Improvement Physical Health  ADL's:  Intact  Cognition:  Impaired,  Mild  Sleep:  Number of Hours: 2.25     Treatment Plan Summary: Plan Patient has appeared to be largely stable for some time.  We have been working on some kind of placement option.  I am optimistic that perhaps we will find something this week.  Jackson RasmussenJohn [name], MD 01/04/2019, 5:40 PM

## 2019-01-04 NOTE — Progress Notes (Signed)
D - Patient was in his room upon arrival to the unit. Patient was pleasant during assessment and medication administration. Patient denies SI/HI/AVH, pain, anxiety and depression with this Clinical research associate.Patient believes that he can alter time with his mind. Patient tried to pass his vision to this writer so I could "see" what he sees.   A - Patient was compliant with medication administration per MD orders and procedures on the unit. Patient given education. Patient given support and encouragement to be active in his treatment plan. Patient informed to let staff know if there are any issues or problems on the unit.   R - Patient being monitored Q 15 minutes for safety per unit protocol. Patient remains safe on the unit.

## 2019-01-04 NOTE — BHH Counselor (Signed)
CSW attempted to follow up with patient and assess for needs but patient was asleep.    Penni Homans, MSW, LCSW 01/04/2019 11:51 AM

## 2019-01-04 NOTE — Progress Notes (Signed)
Recreation Therapy Notes  Date: 01/04/2019  Time: 9:30 am   Location: Craft room   Behavioral response: N/A   Intervention Topic: Anger Management  Discussion/Intervention: Patient did not attend group.   Clinical Observations/Feedback:  Patient did not attend group.   [name] LRT/CTRS        Jackson [name] 01/04/2019 10:41 AM 

## 2019-01-04 NOTE — BHH Counselor (Signed)
CSW spoke with [employer] who reports that the mother will become the payee.  He reports that there should not be a lag in funds and that he just needs a date for when the patient will be at the group home to make sure that mother had access to the funds.   CSW attempted to contact the patient group home but was asked to call back in the morning.  Penni Homans, MSW, LCSW 01/04/2019 4:13 PM

## 2019-01-04 NOTE — Plan of Care (Signed)
Pt denies depression, anxiety, SI, HI and AVH. Pt has been educated on care plans and verbalizes understanding. Torrie Mayers RN Problem: Education: Goal: Knowledge of [facility] Education information/materials will improve Outcome: Progressing   Problem: Coping: Goal: Ability to verbalize frustrations and anger appropriately will improve Outcome: Progressing Goal: Ability to demonstrate self-control will improve Outcome: Progressing   Problem: Safety: Goal: Periods of time without injury will increase Outcome: Progressing   Problem: Education: Goal: Will be free of psychotic symptoms Outcome: Progressing Goal: Knowledge of the prescribed therapeutic regimen will improve Outcome: Progressing   Problem: Self-Concept: Goal: Will verbalize positive feelings about self Outcome: Progressing   Problem: Education: Goal: Ability to state activities that reduce stress will improve Outcome: Progressing

## 2019-01-04 NOTE — BHH Counselor (Signed)
CSW attempted to return the call to [employer].  CSW had to leave a voicemail and requested a return call.  Penni Homans, MSW, LCSW 01/04/2019 10:30 AM

## 2019-01-04 NOTE — Plan of Care (Signed)
Patient still believes he can alter time with his mind.   Problem: Education: Goal: Will be free of psychotic symptoms Outcome: Not Progressing

## 2019-01-04 NOTE — BHH Group Notes (Signed)
LCSW Group Therapy Note  01/04/2019 1:00 PM  Type of Therapy/Topic:  Group Therapy:  Feelings about Diagnosis  Participation Level:  Did Not Attend   Description of Group:   This group will allow patients to explore their thoughts and feelings about diagnoses they have received. Patients will be guided to explore their level of understanding and acceptance of these diagnoses. Facilitator will encourage patients to process their thoughts and feelings about the reactions of others to their diagnosis and will guide patients in identifying ways to discuss their diagnosis with significant others in their lives. This group will be process-oriented, with patients participating in exploration of their own experiences, giving and receiving support, and processing challenge from other group members.   Therapeutic Goals: 1. Patient will demonstrate understanding of diagnosis as evidenced by identifying two or more symptoms of the disorder 2. Patient will be able to express two feelings regarding the diagnosis 3. Patient will demonstrate their ability to communicate their needs through discussion and/or role play  Summary of Patient Progress: X  Therapeutic Modalities:   Cognitive Behavioral Therapy Brief Therapy Feelings Identification   Huel Centola, MSW, LCSW 01/04/2019 2:28 PM   

## 2019-01-05 NOTE — Progress Notes (Signed)
D - Patient was in his room upon arrival to the unit. Patient was pleasant during assessment and medication administration. Patient denies SI/HI/AVH, pain, anxiety and depression with this Clinical research associate.Patient stated, "you should see what I see." When assessed patient stated, "I can see your aura, I can see your future."   A - Patient was compliant with medication administration per MD orders and procedures on the unit. Patient given education. Patient given support and encouragement to be active in his treatment plan. Patient informed to let staff know if there are any issues or problems on the unit.   R - Patient being monitored Q 15 minutes for safety per unit protocol. Patient remains safe on the unit.

## 2019-01-05 NOTE — BHH Counselor (Signed)
CSW spoke with the patient's mother to inform that group home could get patient on 01/06/2019.  Mother reports that she would like for the group home to get the patient on 01/07/2019 so that she and pt's stepfather can ship things to the home to provide the patient with some comfort when he arrives at the home.    CSW informed that she will follow up with group home and Social Security to inform of the change.    Penni Homans, MSW, LCSW 01/05/2019 12:46 PM

## 2019-01-05 NOTE — Plan of Care (Signed)
D- Patient alert and oriented. Patient presents in a pleasant mood on assessment stating that he slept "alright I guess" last night and had no major questions/concerns to voice to this Clinical research associate. Patient denies any signs/symptoms of depression and anxiety. Patient also denies SI, HI, AVH, and pain at this time. Patient's goal for today is to "spread my horizons of abilities".  A- Support and encouragement provided.  Routine safety checks conducted every 15 minutes.  Patient informed to notify staff with problems or concerns.  R- Patient contracts for safety at this time. Patient compliant with medications and treatment plan. Patient receptive, calm, and cooperative. Patient interacts well with others on the unit.  Patient remains safe at this time.  Problem: Education: Goal: Knowledge of [place] [facility] Education information/materials will improve Outcome: Progressing   Problem: Coping: Goal: Ability to verbalize frustrations and anger appropriately will improve Outcome: Progressing Goal: Ability to demonstrate self-control will improve Outcome: Progressing   Problem: Safety: Goal: Periods of time without injury will increase Outcome: Progressing   Problem: Education: Goal: Will be free of psychotic symptoms Outcome: Progressing Goal: Knowledge of the prescribed therapeutic regimen will improve Outcome: Progressing   Problem: Self-Concept: Goal: Will verbalize positive feelings about self Outcome: Progressing   Problem: Education: Goal: Ability to state activities that reduce stress will improve Outcome: Progressing

## 2019-01-05 NOTE — Tx Team (Signed)
Interdisciplinary Treatment and Diagnostic Plan Update  01/05/2019 Time of Session: 8:30AM  Jackson [name] MRN: [id number]  Principal Diagnosis: Schizoaffective disorder, bipolar type [facility])  Secondary Diagnoses: Principal Problem:   Schizoaffective disorder, bipolar type (HCC) Active Problems:   Noncompliance   Current Medications:  Current Facility-Administered Medications  Medication Dose Route Frequency Provider Last Rate Last Dose  . acetaminophen (TYLENOL) tablet 650 mg  650 mg Oral Q6H PRN [name], MD   650 mg at 10/26/18 2302  . alum & mag hydroxide-simeth (MAALOX/MYLANTA) 200-200-20 MG/5ML suspension 30 mL  30 mL Oral Q4H PRN [name], MD   30 mL at 10/26/18 2129  . ARIPiprazole ER (ABILIFY MAINTENA) injection 400 mg  400 mg Intramuscular Q28 days Charm Rings, NP   400 mg at 12/08/18 2217  . divalproex (DEPAKOTE ER) 24 hr tablet 1,500 mg  1,500 mg Oral QHS [name], [name], MD   1,500 mg at 01/04/19 2230  . hydrocerin (EUCERIN) cream   Topical BID [name], [name], MD      . hydrOXYzine (ATARAX/VISTARIL) tablet 50 mg  50 mg Oral Q4H PRN [name], MD   50 mg at 10/28/18 4076  . Influenza vac split quadrivalent PF (FLUARIX) injection 0.5 mL  0.5 mL Intramuscular Once Charm Rings, NP      . magnesium hydroxide (MILK OF MAGNESIA) suspension 30 mL  30 mL Oral Daily PRN [name], MD      . OLANZapine [facility]) injection 10 mg  10 mg Intramuscular Q6H PRN [name], [name], MD      . OLANZapine (ZYPREXA) tablet 30 mg  30 mg Oral QHS Charm Rings, NP   30 mg at 01/04/19 2230  . traZODone (DESYREL) tablet 100 mg  100 mg Oral QHS PRN [name], MD   100 mg at 01/02/19 2244   PTA Medications: No medications prior to admission.    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   Physician Treatment Plan for Primary Diagnosis: Schizoaffective disorder, bipolar type (HCC) Long Term Goal(s): Improvement in symptoms so as ready for discharge Improvement in symptoms so as ready for discharge   Short Term Goals: Ability to demonstrate self-control will improve Ability to identify and develop effective coping behaviors will improve Compliance with prescribed medications will improve  Medication Management: Evaluate patient's response, side effects, and tolerance of medication regimen.  Therapeutic Interventions: 1 to 1 Genther, Unit Group Causby and Medication administration.  Evaluation of Outcomes: Progressing  Physician Treatment Plan for Secondary Diagnosis: Principal Problem:   Schizoaffective disorder, bipolar type (HCC) Active Problems:   Noncompliance  Long Term Goal(s): Improvement in symptoms so as ready for discharge Improvement in symptoms so as ready for discharge   Short Term Goals: Ability to demonstrate self-control will improve Ability to identify and develop effective coping behaviors will improve Compliance with prescribed medications will improve     Medication Management: Evaluate patient's response, side effects, and tolerance of medication regimen.  Therapeutic Interventions: 1 to 1 Loth, Unit Group Roosevelt and Medication administration.  Evaluation of Outcomes: Progressing   RN Treatment Plan for Primary Diagnosis: Schizoaffective disorder, bipolar type (HCC) Long Term Goal(s): Knowledge of disease and therapeutic regimen to maintain health will improve  Short Term Goals: Ability to verbalize frustration and anger  appropriately will improve, Ability to demonstrate self-control, Ability to participate in decision making will improve and Ability to verbalize feelings will improve  Medication Management: RN will  administer medications as ordered by provider, will assess and evaluate patient's response and provide education to patient for prescribed medication. RN will report any adverse and/or side effects to prescribing provider.  Therapeutic Interventions: 1 on 1 counseling [place], Psychoeducation, Medication administration, Evaluate responses to treatment, Monitor vital signs and CBGs as ordered, Perform/monitor CIWA, COWS, AIMS and Fall Risk screenings as ordered, Perform wound care treatments as ordered.  Evaluation of Outcomes: Progressing   LCSW Treatment Plan for Primary Diagnosis: Schizoaffective disorder, bipolar type (HCC) Long Term Goal(s): Safe transition to appropriate next level of care at discharge, Engage patient in therapeutic group addressing interpersonal concerns.  Short Term Goals: Engage patient in aftercare planning with referrals and resources, Increase social support, Increase ability to appropriately verbalize feelings, Increase emotional regulation and Facilitate acceptance of mental health diagnosis and concerns  Therapeutic Interventions: Assess for all discharge needs, 1 to 1 time with Social worker, Explore available resources and support systems, Assess for adequacy in community support network, Educate family and significant other(s) on suicide prevention, Complete Psychosocial Assessment, Interpersonal group therapy.  Evaluation of Outcomes: Progressing   Progress in Treatment: Attending groups: No. Participating in groups: No. Taking medication as prescribed: Yes.   Toleration medication: Yes. Family/Significant other contact made: Yes, individual(s) contacted:  CSW completed SPE with the patients mother.  Patient understands diagnosis: Yes. Discussing patient identified problems/goals with staff: Yes. Medical problems stabilized or resolved: Yes. Denies suicidal/homicidal ideation: Yes. Issues/concerns per patient self-inventory: No. Other: none  New  problem(s) identified: No, Describe:  none  New Short Term/Long Term Goal(s): elimination of symptoms of psychosis, medication management for mood stabilization; elimination of SI thoughts; development of comprehensive mental wellness plan.  Patient Goals:  "independence"  Discharge Plan or Barriers:  Patient's mother remains the patients guardian and has now been named the patient's payee.  [facility].  Plan at this time is to have the patient transferred to the group home on 01/07/2019.  Mother has asked that the patient remain until that date so that she and the patients stepfather can ship comfort items to the patient's group home and provide with comfort.  Pt will be working with Pricilla HandlerStephanie Dunn with ACTT team.  Group home reports that she will refer the patient to a PCP.      Reason for Continuation of Hospitalization: Aggression Delusions  Hallucinations Mania Medication stabilization  Estimated Length of Stay: TBD  Recreational Therapy: Patient Stressors: N/A Patient Goal: Patient will focus on task/topic with 2 prompts from staff within 5 recreation therapy group Majeed  Attendees: Patient:  01/05/2019 12:49 PM  Physician: Dr. Toni Amendlapacs, MD 01/05/2019 12:49 PM  Nursing:  01/05/2019 12:49 PM  RN Care Manager: 01/05/2019 12:49 PM  Social Worker: Penni HomansMichaela Syria Kestner, LCSW 01/05/2019 12:49 PM  Recreational Therapist:  01/05/2019 12:49 PM  Other:  01/05/2019 12:49 PM  Other:  01/05/2019 12:49 PM  Other: 01/05/2019 12:49 PM    Scribe for Treatment Team: Harden MoMichaela J Katana Berthold, LCSW 01/05/2019 12:49 PM

## 2019-01-05 NOTE — Plan of Care (Signed)
Patient stated to this writer, "you should see what I see". When assessed patient said, "I can see your aura and your future."   Problem: Education: Goal: Will be free of psychotic symptoms Outcome: Not Progressing

## 2019-01-05 NOTE — BHH Group Notes (Signed)
LCSW Group Therapy Note  01/05/2019 12:27 PM  Type of Therapy/Topic:  Group Therapy:  Emotion Regulation  Participation Level:  Did Not Attend   Description of Group:   The purpose of this group is to assist patients in learning to regulate negative emotions and experience positive emotions. Patients will be guided to discuss ways in which they have been vulnerable to their negative emotions. These vulnerabilities will be juxtaposed with experiences of positive emotions or situations, and patients will be challenged to use positive emotions to combat negative ones. Special emphasis will be placed on coping with negative emotions in conflict situations, and patients will process healthy conflict resolution skills.  Therapeutic Goals: 1. Patient will identify two positive emotions or experiences to reflect on in order to balance out negative emotions 2. Patient will label two or more emotions that they find the most difficult to experience 3. Patient will demonstrate positive conflict resolution skills through discussion and/or role plays  Summary of Patient Progress: x   Therapeutic Modalities:   Cognitive Behavioral Therapy Feelings Identification Dialectical Behavioral Therapy   Iris Pert, MSW, LCSW Clinical Social Work 01/05/2019 12:27 PM

## 2019-01-05 NOTE — Progress Notes (Signed)
Recreation Therapy Notes  Date: 01/05/2019  Time: 9:30 am   Location: Craft room   Behavioral response: N/A   Intervention Topic: Wellness  Discussion/Intervention: Patient did not attend group.   Clinical Observations/Feedback:  Patient did not attend group.   Craven Crean LRT/CTRS        Jackson [name] 01/05/2019 11:00 AM 

## 2019-01-05 NOTE — BHH Counselor (Signed)
CSW spoke with Social Security regarding the patient being discharged on 01/06/2019 to the group home. He reports that once the patient has been discharged then the paper check can be released to the mother.   CSW attempted to call the mother, however, no answer.  CSW left HIPAA compliant message.   Penni Homans, MSW, LCSW 01/05/2019 11:58 AM

## 2019-01-05 NOTE — Progress Notes (Signed)
[facility]  01/05/2019 4:45 PM Jackson [name]  MRN:  [id number] Subjective: Follow-up patient with schizoaffective disorder.  No new complaints.  He is looking forward to discharge within the next day or so. Principal Problem: Schizoaffective disorder, bipolar type (HCC) Diagnosis: Principal Problem:   Schizoaffective disorder, bipolar type (HCC) Active Problems:   Noncompliance  Total Time spent with patient: 15 minutes  Past Psychiatric History: Past history of schizoaffective disorder several prior hospitalizations  Past Medical History:  Past Medical History:  Diagnosis Date  . Bipolar 1 disorder (HCC)   . Schizoaffective disorder (HCC)    History reviewed. No pertinent surgical history. Family History: History reviewed. No pertinent family history. Family Psychiatric  History: See previous Social History:  Social History   Substance and Sexual Activity  Alcohol Use Not on file     Social History   Substance and Sexual Activity  Drug Use Not on file    Social History   Socioeconomic History  . Marital status: Single    Spouse name: Not on file  . Number of children: Not on file  . Years of education: Not on file  . Highest education level: Not on file  Occupational History  . Not on file  Social Needs  . Financial resource strain: Not on file  . Food insecurity:    Worry: Not on file    Inability: Not on file  . Transportation needs:    Medical: Not on file    Non-medical: Not on file  Tobacco Use  . Smoking status: Light Tobacco Smoker    Packs/day: 0.25    Types: Cigarettes  . Smokeless tobacco: Never Used  Substance and Sexual Activity  . Alcohol use: Not on file  . Drug use: Not on file  . Sexual activity: Not on file  Lifestyle  . Physical activity:    Days per week: Not on file    Minutes per session: Not on file  . Stress: Not on file  Relationships  . Social connections:    Talks on phone: Not on file    Gets together: Not on  file    Attends religious service: Not on file    Active member of club or organization: Not on file    Attends meetings of clubs or organizations: Not on file    Relationship status: Not on file  Other Topics Concern  . Not on file  Social History Narrative  . Not on file   Additional Social History:                         Sleep: Fair  Appetite:  Good  Current Medications: Current Facility-Administered Medications  Medication Dose Route Frequency Provider Last Rate Last Dose  . acetaminophen (TYLENOL) tablet 650 mg  650 mg Oral Q6H PRN Terance HartStephens, [name], MD   650 mg at 10/26/18 2302  . alum & mag hydroxide-simeth (MAALOX/MYLANTA) 200-200-20 MG/5ML suspension 30 mL  30 mL Oral Q4H PRN Terance HartStephens, [name], MD   30 mL at 10/26/18 2129  . ARIPiprazole ER (ABILIFY MAINTENA) injection 400 mg  400 mg Intramuscular Q28 days Charm RingsLord, Jamison Y, NP   400 mg at 12/08/18 2217  . divalproex (DEPAKOTE ER) 24 hr tablet 1,500 mg  1,500 mg Oral QHS ,  T, MD   1,500 mg at 01/04/19 2230  . hydrocerin (EUCERIN) cream   Topical BID , [name] T, MD      .  hydrOXYzine (ATARAX/VISTARIL) tablet 50 mg  50 mg Oral Q4H PRN [name], MD   50 mg at 10/28/18 0177  . Influenza vac split quadrivalent PF (FLUARIX) injection 0.5 mL  0.5 mL Intramuscular Once Charm Rings, NP      . magnesium hydroxide (MILK OF MAGNESIA) suspension 30 mL  30 mL Oral Daily PRN [name], MD      . OLANZapine [facility]) injection 10 mg  10 mg Intramuscular Q6H PRN ,  T, MD      . OLANZapine (ZYPREXA) tablet 30 mg  30 mg Oral QHS Charm Rings, NP   30 mg at 01/04/19 2230  . traZODone (DESYREL) tablet 100 mg  100 mg Oral QHS PRN [name], MD   100 mg at 01/02/19 2244    Lab Results: No results found for this or any previous visit (from the past 48 hour(s)).  Blood Alcohol level:  Lab Results  Component Value Date   ETH <10 10/16/2018    Metabolic Disorder  Labs: Lab Results  Component Value Date   HGBA1C 5.2 10/23/2018   MPG 102.54 10/23/2018   No results found for: PROLACTIN Lab Results  Component Value Date   CHOL 129 10/23/2018   TRIG 58 10/23/2018   HDL 50 10/23/2018   CHOLHDL 2.6 10/23/2018   VLDL 12 10/23/2018   LDLCALC 67 10/23/2018    Physical Findings: AIMS: Facial and Oral Movements Muscles of Facial Expression: None, normal Lips and Perioral Area: None, normal Jaw: None, normal Tongue: None, normal,Extremity Movements Upper (arms, wrists, hands, fingers): None, normal Lower (legs, knees, ankles, toes): None, normal, Trunk Movements Neck, shoulders, hips: None, normal, Overall Severity Severity of abnormal movements (highest score from questions above): None, normal Incapacitation due to abnormal movements: None, normal Patient's awareness of abnormal movements (rate only patient's report): No Awareness, Dental Status Current problems with teeth and/or dentures?: No Does patient usually wear dentures?: No  CIWA:  CIWA-Ar Total: 0 COWS:  COWS Total Score: 0  Musculoskeletal: Strength & Muscle Tone: within normal limits Gait & Station: normal Patient leans: N/A  Psychiatric Specialty Exam: Physical Exam  Nursing note and vitals reviewed. Constitutional: He appears well-developed and well-nourished.  HENT:  Head: Normocephalic and atraumatic.  Eyes: Pupils are equal, round, and reactive to light. Conjunctivae are normal.  Neck: Normal range of motion.  Cardiovascular: Regular rhythm and normal heart sounds.  Respiratory: Effort normal.  GI: Soft.  Musculoskeletal: Normal range of motion.  Neurological: He is alert.  Skin: Skin is warm and dry.  Psychiatric: He has a normal mood and affect. His behavior is normal. Judgment and thought content normal.    Review of Systems  Constitutional: Negative.   HENT: Negative.   Eyes: Negative.   Respiratory: Negative.   Cardiovascular: Negative.    Gastrointestinal: Negative.   Musculoskeletal: Negative.   Skin: Negative.   Neurological: Negative.   Psychiatric/Behavioral: Negative.     Blood pressure 112/70, pulse 68, temperature 97.9 F (36.6 C), temperature source Oral, resp. rate 17, height 5\' 11"  (1.803 m), weight 88.5 kg, SpO2 100 %.Body mass index is 27.2 kg/m.  General Appearance: Casual  Eye Contact:  Good  Speech:  Clear and Coherent  Volume:  Normal  Mood:  Euthymic  Affect:  Constricted  Thought Process:  Goal Directed  Orientation:  Full (Time, Place, and Person)  Thought Content:  Rumination  Suicidal Thoughts:  No  Homicidal Thoughts:  No  Memory:  Immediate;  Fair Recent;   Fair Remote;   Fair  Judgement:  Fair  Insight:  Fair  Psychomotor Activity:  Normal  Concentration:  Concentration: Fair  Recall:  [employer] of Knowledge:  Fair  Language:  Fair  Akathisia:  No  Handed:  Right  AIMS (if indicated):     Assets:  Desire for Improvement Financial Resources/Insurance Housing Physical Health  ADL's:  Intact  Cognition:  WNL  Sleep:  Number of Hours: 4.5     Treatment Plan Summary: Plan At this time it appears we have reached a agreement that he will go to a group home tomorrow.  I am going to make preparations so that we have medications ready for him.  Patient is agreeable to this.  Hopefully we will be able to get him on the long-acting injectable as well before he is discharged.  Mordecai Rasmussen, MD 01/05/2019, 4:45 PM

## 2019-01-06 MED ORDER — TRAZODONE HCL 100 MG PO TABS: 100.0000 mg | ORAL_TABLET | Freq: Every evening | ORAL | 0 refills | Status: DC | PRN

## 2019-01-06 MED ORDER — OLANZAPINE 15 MG PO TABS: 30.0000 mg | ORAL_TABLET | Freq: Every day | ORAL | 0 refills | Status: DC

## 2019-01-06 MED ORDER — DIVALPROEX SODIUM ER 500 MG PO TB24: 1500.0000 mg | ORAL_TABLET | Freq: Every day | ORAL | 0 refills | Status: DC

## 2019-01-06 MED ORDER — HYDROXYZINE HCL 50 MG PO TABS: 50.0000 mg | ORAL_TABLET | ORAL | 0 refills | Status: DC | PRN

## 2019-01-06 NOTE — BHH Group Notes (Signed)
Balance In Life 01/06/2019 1PM  Type of Therapy/Topic:  Group Therapy:  Balance in Life  Participation Level:  Active  Description of Group:   This group will address the concept of balance and how it feels and looks when one is unbalanced. Patients will be encouraged to process areas in their lives that are out of balance and identify reasons for remaining unbalanced. Facilitators will guide patients in utilizing problem-solving interventions to address and correct the stressor making their life unbalanced. Understanding and applying boundaries will be explored and addressed for obtaining and maintaining a balanced life. Patients will be encouraged to explore ways to assertively make their unbalanced needs known to significant others in their lives, using other group members and facilitator for support and feedback.  Therapeutic Goals: 1. Patient will identify two or more emotions or situations they have that consume much of in their lives. 2. Patient will identify signs/triggers that life has become out of balance:  3. Patient will identify two ways to set boundaries in order to achieve balance in their lives:  4. Patient will demonstrate ability to communicate their needs through discussion and/or role plays  Summary of Patient Progress: Actively and appropriately engaged in the group. Patient was able to provide support and validation to other group members.Patient practiced active listening when interacting with the facilitator and other group members. Patient demonstrated good insight and offered community resources to a group member to assist her with finding balance in her relationships. Patient respected boundaries during session.    Therapeutic Modalities:   Cognitive Behavioral Therapy Solution-Focused Therapy Assertiveness Training  Jamina Macbeth Philip Aspen, LCSW

## 2019-01-06 NOTE — BHH Suicide Risk Assessment (Signed)
[facility] Discharge Suicide Risk Assessment   Principal Problem: Schizoaffective disorder, bipolar type [facility]) Discharge Diagnoses: Principal Problem:   Schizoaffective disorder, bipolar type (HCC) Active Problems:   Noncompliance   Total Time spent with patient: 45 minutes  Musculoskeletal: Strength & Muscle Tone: within normal limits Gait & Station: normal Patient leans: N/A  Psychiatric Specialty Exam: Review of Systems  Constitutional: Negative.   HENT: Negative.   Eyes: Negative.   Respiratory: Negative.   Cardiovascular: Negative.   Gastrointestinal: Negative.   Musculoskeletal: Negative.   Skin: Negative.   Neurological: Negative.   Psychiatric/Behavioral: Negative.     Blood pressure (!) 97/56, pulse 85, temperature 97.8 F (36.6 C), temperature source Oral, resp. rate 18, height 5\' 11"  (1.803 m), weight 88.5 kg, SpO2 98 %.Body mass index is 27.2 kg/m.  General Appearance: Casual  Eye Contact::  Good  Speech:  Slow409  Volume:  Decreased  Mood:  Euthymic  Affect:  Congruent  Thought Process:  Coherent  Orientation:  Full (Time, Place, and Person)  Thought Content:  Logical  Suicidal Thoughts:  No  Homicidal Thoughts:  No  Memory:  Immediate;   Fair Recent;   Fair Remote;   Fair  Judgement:  Fair  Insight:  Fair  Psychomotor Activity:  Decreased  Concentration:  Fair  Recall:  [employer] of Knowledge:Fair  Language: Fair  Akathisia:  No  Handed:  Right  AIMS (if indicated):     Assets:  Desire for Improvement Financial Resources/Insurance Housing Physical Health Resilience Social Support  Sleep:  Number of Hours: 1  Cognition: WNL  ADL's:  Intact   Mental Status Per Nursing Assessment::   On Admission:  NA  Demographic Factors:  Male  Loss Factors: Financial problems/change in socioeconomic status  Historical Factors: Impulsivity  Risk Reduction Factors:   Living with another person, especially a relative, Positive social support and  Positive therapeutic relationship  Continued Clinical Symptoms:  Bipolar Disorder:   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  Patients presenting with no risk factors but with morbid ruminations; may be classified as minimal risk based on the severity of the depressive symptoms  Follow-up Information    [facility] Follow up.   Contact information: [address] [place] [address] [phone]        [facility] Follow up.   Specialty:  Behavioral Health Why:  on the waiting list Contact information: [address] [facility] [place] [address] [phone]       [facility] Follow up.   Contact information: [address] [place] [address] [phone]        [facility] [place] [employer] Follow up.   Contact information: [address] [place] [address] [phone]           Plan Of Care/Follow-up recommendations:  Activity:  Activity as tolerated Diet:  Regular diet Other:  Follow-up with outpatient providers with [facility].  Other options available as well.  Stay on current medicine.  Try to make the best of the group home situation.  Patient at this point does not have any suicidal ideation and has shown no tendency to self injury in the hospital.  Mordecai Rasmussen, MD 01/06/2019, 1:29 PM

## 2019-01-06 NOTE — Progress Notes (Signed)
[facility]  01/06/2019 1:26 PM Jackson [name]  MRN:  [id number] Subjective: Follow-up patient with schizoaffective disorder.  No new complaints today.  We have finally achieved a situation where he is going to be discharged tomorrow.  We are going to make preparations for this.  Apparently he is going to a group home and is agreeable to that.  Patient is not reporting suicidal thoughts.  Not acting bizarrely although still in conversation is preoccupied with some odd thoughts.  No sign of acute dangerousness.  Physically healthy. Principal Problem: Schizoaffective disorder, bipolar type (HCC) Diagnosis: Principal Problem:   Schizoaffective disorder, bipolar type (HCC) Active Problems:   Noncompliance  Total Time spent with patient: 20 minutes  Past Psychiatric History: Long history of schizoaffective disorder several prior hospitalizations  Past Medical History:  Past Medical History:  Diagnosis Date  . Bipolar 1 disorder (HCC)   . Schizoaffective disorder (HCC)    History reviewed. No pertinent surgical history. Family History: History reviewed. No pertinent family history. Family Psychiatric  History: See previous Social History:  Social History   Substance and Sexual Activity  Alcohol Use Not on file     Social History   Substance and Sexual Activity  Drug Use Not on file    Social History   Socioeconomic History  . Marital status: Single    Spouse name: Not on file  . Number of children: Not on file  . Years of education: Not on file  . Highest education level: Not on file  Occupational History  . Not on file  Social Needs  . Financial resource strain: Not on file  . Food insecurity:    Worry: Not on file    Inability: Not on file  . Transportation needs:    Medical: Not on file    Non-medical: Not on file  Tobacco Use  . Smoking status: Light Tobacco Smoker    Packs/day: 0.25    Types: Cigarettes  . Smokeless tobacco: Never Used  Substance and  Sexual Activity  . Alcohol use: Not on file  . Drug use: Not on file  . Sexual activity: Not on file  Lifestyle  . Physical activity:    Days per week: Not on file    Minutes per session: Not on file  . Stress: Not on file  Relationships  . Social connections:    Talks on phone: Not on file    Gets together: Not on file    Attends religious service: Not on file    Active member of club or organization: Not on file    Attends meetings of clubs or organizations: Not on file    Relationship status: Not on file  Other Topics Concern  . Not on file  Social History Narrative  . Not on file   Additional Social History:                         Sleep: Fair  Appetite:  Fair  Current Medications: Current Facility-Administered Medications  Medication Dose Route Frequency Provider Last Rate Last Dose  . acetaminophen (TYLENOL) tablet 650 mg  650 mg Oral Q6H PRN Terance HartStephens, [name], MD   650 mg at 10/26/18 2302  . alum & mag hydroxide-simeth (MAALOX/MYLANTA) 200-200-20 MG/5ML suspension 30 mL  30 mL Oral Q4H PRN Terance HartStephens, [name], MD   30 mL at 10/26/18 2129  . ARIPiprazole ER (ABILIFY MAINTENA) injection 400 mg  400 mg Intramuscular Q28  days Charm Rings, NP   400 mg at 01/05/19 1837  . divalproex (DEPAKOTE ER) 24 hr tablet 1,500 mg  1,500 mg Oral QHS Dabid Godown T, MD   1,500 mg at 01/05/19 2237  . hydrocerin (EUCERIN) cream   Topical BID Shalini Mair T, MD      . hydrOXYzine (ATARAX/VISTARIL) tablet 50 mg  50 mg Oral Q4H PRN [name], MD   50 mg at 10/28/18 0865  . Influenza vac split quadrivalent PF (FLUARIX) injection 0.5 mL  0.5 mL Intramuscular Once Charm Rings, NP      . magnesium hydroxide (MILK OF MAGNESIA) suspension 30 mL  30 mL Oral Daily PRN [name], MD      . OLANZapine [facility]) injection 10 mg  10 mg Intramuscular Q6H PRN Parke Jandreau T, MD      . OLANZapine (ZYPREXA) tablet 30 mg  30 mg Oral QHS Charm Rings, NP   30 mg at  01/05/19 2236  . traZODone (DESYREL) tablet 100 mg  100 mg Oral QHS PRN [name], MD   100 mg at 01/02/19 2244    Lab Results: No results found for this or any previous visit (from the past 48 hour(s)).  Blood Alcohol level:  Lab Results  Component Value Date   ETH <10 10/16/2018    Metabolic Disorder Labs: Lab Results  Component Value Date   HGBA1C 5.2 10/23/2018   MPG 102.54 10/23/2018   No results found for: PROLACTIN Lab Results  Component Value Date   CHOL 129 10/23/2018   TRIG 58 10/23/2018   HDL 50 10/23/2018   CHOLHDL 2.6 10/23/2018   VLDL 12 10/23/2018   LDLCALC 67 10/23/2018    Physical Findings: AIMS: Facial and Oral Movements Muscles of Facial Expression: None, normal Lips and Perioral Area: None, normal Jaw: None, normal Tongue: None, normal,Extremity Movements Upper (arms, wrists, hands, fingers): None, normal Lower (legs, knees, ankles, toes): None, normal, Trunk Movements Neck, shoulders, hips: None, normal, Overall Severity Severity of abnormal movements (highest score from questions above): None, normal Incapacitation due to abnormal movements: None, normal Patient's awareness of abnormal movements (rate only patient's report): No Awareness, Dental Status Current problems with teeth and/or dentures?: No Does patient usually wear dentures?: No  CIWA:  CIWA-Ar Total: 0 COWS:  COWS Total Score: 0  Musculoskeletal: Strength & Muscle Tone: within normal limits Gait & Station: normal Patient leans: N/A  Psychiatric Specialty Exam: Physical Exam  Nursing note and vitals reviewed. Constitutional: He appears well-developed and well-nourished.  HENT:  Head: Normocephalic and atraumatic.  Eyes: Pupils are equal, round, and reactive to light. Conjunctivae are normal.  Neck: Normal range of motion.  Cardiovascular: Regular rhythm and normal heart sounds.  Respiratory: Effort normal. No respiratory distress.  GI: Soft.  Musculoskeletal:  Normal range of motion.  Neurological: He is alert.  Skin: Skin is warm and dry.  Psychiatric: He has a normal mood and affect. His behavior is normal. Judgment and thought content normal.    Review of Systems  Constitutional: Negative.   HENT: Negative.   Eyes: Negative.   Respiratory: Negative.   Cardiovascular: Negative.   Gastrointestinal: Negative.   Musculoskeletal: Negative.   Skin: Negative.   Neurological: Negative.   Psychiatric/Behavioral: Negative.     Blood pressure (!) 97/56, pulse 85, temperature 97.8 F (36.6 C), temperature source Oral, resp. rate 18, height 5\' 11"  (1.803 m), weight 88.5 kg, SpO2 98 %.Body mass index is 27.2  kg/m.  General Appearance: Casual  Eye Contact:  Good  Speech:  Slow  Volume:  Decreased  Mood:  Euthymic  Affect:  Congruent  Thought Process:  Goal Directed  Orientation:  Full (Time, Place, and Person)  Thought Content:  Logical  Suicidal Thoughts:  No  Homicidal Thoughts:  No  Memory:  Immediate;   Fair Recent;   Fair Remote;   Fair  Judgement:  Fair  Insight:  Fair  Psychomotor Activity:  Normal  Concentration:  Concentration: Fair  Recall:  [employer] of Knowledge:  Fair  Language:  Fair  Akathisia:  No  Handed:  Right  AIMS (if indicated):     Assets:  Desire for Improvement Financial Resources/Insurance Resilience Social Support  ADL's:  Intact  Cognition:  WNL  Sleep:  Number of Hours: 1     Treatment Plan Summary: Daily contact with patient to assess and evaluate symptoms and progress in treatment, Medication management and Plan Patient appears to be stable.  We are going to go ahead and get preparations underway for discharge.  I will try to request a brief supply of his medicine as well as prepare prescriptions.  Spent time reviewing with the patient the importance of staying on his medication.  Mordecai Rasmussen, MD 01/06/2019, 1:26 PM

## 2019-01-06 NOTE — Progress Notes (Signed)
Recreation Therapy Notes  Date: 01/06/2019  Time: 9:30 am   Location: Craft room   Behavioral response: N/A   Intervention Topic: Goals  Discussion/Intervention: Patient did not attend group.   Clinical Observations/Feedback:  Patient did not attend group.   Daanish Copes LRT/CTRS         Zakarie Sturdivant 01/06/2019 12:02 PM

## 2019-01-06 NOTE — Plan of Care (Signed)
Patient still believes he can control time with his mind.   Problem: Education: Goal: Will be free of psychotic symptoms Outcome: Not Progressing

## 2019-01-06 NOTE — [facility] Counselor (Signed)
CSW called Allen Norris, [facility], [phone], to discuss details for the patient's pick up on 01/07/2019.  There was background noise on the line and Scarlette Calico reports that she would call this CSW back.  Penni Homans, MSW, LCSW 01/06/2019 12:53 PM

## 2019-01-06 NOTE — Progress Notes (Signed)
Patient received a negative PPD(TB testing result) reading on 12/30/18.

## 2019-01-06 NOTE — [facility] Counselor (Signed)
CSW faxed copy of Covid 19 test and TB test results to the group home.  CSW received confirmation the fax was successful.   Penni Homans, MSW, LCSW 01/06/2019 3:28 PM

## 2019-01-06 NOTE — Plan of Care (Signed)
Patient is alert and oriented X 3, Denies SI, HI and AVH. Patient is animated; appropriated on the unit, denies pain. Patient attended groups today. Sleeping; eating  and taking medications well. Safety checks Q 15. No self injurious behaviors. Problem: Education: Goal: Knowledge of [facility] Education information/materials will improve Outcome: Progressing   Problem: Coping: Goal: Ability to verbalize frustrations and anger appropriately will improve Outcome: Progressing Goal: Ability to demonstrate self-control will improve Outcome: Progressing   Problem: Safety: Goal: Periods of time without injury will increase Outcome: Progressing   Problem: Education: Goal: Will be free of psychotic symptoms Outcome: Progressing Goal: Knowledge of the prescribed therapeutic regimen will improve Outcome: Progressing

## 2019-01-07 MED ORDER — OLANZAPINE 15 MG PO TABS: 30.0000 mg | ORAL_TABLET | Freq: Every day | ORAL | 1 refills | Status: DC

## 2019-01-07 MED ORDER — TRAZODONE HCL 100 MG PO TABS: 100.0000 mg | ORAL_TABLET | Freq: Every evening | ORAL | 1 refills | Status: DC | PRN

## 2019-01-07 MED ORDER — ARIPIPRAZOLE ER 400 MG IM SRER: 400.0000 mg | INTRAMUSCULAR | 1 refills | Status: DC

## 2019-01-07 MED ORDER — HYDROXYZINE HCL 50 MG PO TABS: 50.0000 mg | ORAL_TABLET | ORAL | 1 refills | Status: DC | PRN

## 2019-01-07 MED ORDER — DIVALPROEX SODIUM ER 500 MG PO TB24: 1500.0000 mg | ORAL_TABLET | Freq: Every day | ORAL | 1 refills | Status: DC

## 2019-01-07 NOTE — Plan of Care (Signed)
Pt denies depression, anxiety, SI, HI and AVH. Pt has a goal to discharge. Pt was educated on care plan and verbalizes understanding. Torrie Mayers RN Problem: Education: Goal: Knowledge of [facility] Education information/materials will improve Outcome: Progressing   Problem: Coping: Goal: Ability to verbalize frustrations and anger appropriately will improve Outcome: Progressing Goal: Ability to demonstrate self-control will improve Outcome: Progressing   Problem: Safety: Goal: Periods of time without injury will increase Outcome: Progressing   Problem: Education: Goal: Will be free of psychotic symptoms Outcome: Progressing Goal: Knowledge of the prescribed therapeutic regimen will improve Outcome: Progressing   Problem: Self-Concept: Goal: Will verbalize positive feelings about self Outcome: Progressing   Problem: Education: Goal: Ability to state activities that reduce stress will improve Outcome: Progressing

## 2019-01-07 NOTE — Progress Notes (Signed)
Pt denied SI, HI and AVH. Pt was educated on care plan and verbalized understanding. Pt received belongings, prescriptions, AVS, transition record and suicide risk assessment. Pt was dc to group home. Torrie Mayers RN

## 2019-01-07 NOTE — Progress Notes (Signed)
  [facility] Adult Case Management Discharge Plan :  Will you be returning to the same living situation after discharge:  No. At discharge, do you have transportation home?: Yes,  group home is providing transportation home.  Do you have the ability to pay for your medications: Yes,  Medicaid  Release of information consent forms completed and in the chart;  Patient's signature needed at discharge.  Patient to Follow up at: Follow-up Information       [facility] Follow up.   Contact information: [address] [place] [address] [phone] Please follow up with [facility] upon discharge.  They will be in contact as well.                  Next level of care provider has access to [facility] Link:no  Safety Planning and Suicide Prevention discussed: Yes,  SPE completed with mother.  Have you used any form of tobacco in the last 30 days? (Cigarettes, Smokeless Tobacco, Cigars, and/or Pipes): Yes  Has patient been referred to the Quitline?: Patient refused referral  Patient has been referred for addiction treatment: N/A  Harden Mo, LCSW 01/07/2019, 1:04 PM

## 2019-01-07 NOTE — Discharge Summary (Signed)
Physician Discharge Summary Note  Patient:  Jackson [name] is an 33 y.o., male MRN:  [id number] DOB:  Aug 26, 1985 Patient phone:  [phone] (home)  Patient address:   [address] [place] [address],  Total Time spent with patient: 45 minutes  Date of Admission:  10/17/2018 Date of Discharge: October 07, 2018  Reason for Admission: Patient was admitted to the hospital under involuntary commitment papers in a manic condition having left his group home and been off of his medicine  Principal Problem: Schizoaffective disorder, bipolar type [facility]) Discharge Diagnoses: Principal Problem:   Schizoaffective disorder, bipolar type (HCC) Active Problems:   Noncompliance   Past Psychiatric History: Patient has a longstanding history of chronic mental illness.  Multiple prior hospitalizations including lengthy state hospital stays.  Had been in a group home for years prior to coming to the hospital but had discontinued his medicine.  Has some history of aggression and agitated behavior towards others especially family of origin. had been stable on lithium and antipsychotics or Depakote and antipsychotics in the past Past Medical History:  Past Medical History:  Diagnosis Date  . Bipolar 1 disorder (HCC)   . Schizoaffective disorder (HCC)    History reviewed. No pertinent surgical history. Family History: History reviewed. No pertinent family history. Family Psychiatric  History: None reported Social History:  Social History   Substance and Sexual Activity  Alcohol Use Not on file     Social History   Substance and Sexual Activity  Drug Use Not on file    Social History   Socioeconomic History  . Marital status: Single    Spouse name: Not on file  . Number of children: Not on file  . Years of education: Not on file  . Highest education level: Not on file  Occupational History  . Not on file  Social Needs  . Financial resource strain: Not on file  . Food insecurity:     Worry: Not on file    Inability: Not on file  . Transportation needs:    Medical: Not on file    Non-medical: Not on file  Tobacco Use  . Smoking status: Light Tobacco Smoker    Packs/day: 0.25    Types: Cigarettes  . Smokeless tobacco: Never Used  Substance and Sexual Activity  . Alcohol use: Not on file  . Drug use: Not on file  . Sexual activity: Not on file  Lifestyle  . Physical activity:    Days per week: Not on file    Minutes per session: Not on file  . Stress: Not on file  Relationships  . Social connections:    Talks on phone: Not on file    Gets together: Not on file    Attends religious service: Not on file    Active member of club or organization: Not on file    Attends meetings of clubs or organizations: Not on file    Relationship status: Not on file  Other Topics Concern  . Not on file  Social History Narrative  . Not on file    Hospital Course: Patient admitted to the hospital.  15-minute checks in place.  Patient has had a very lengthy hospitalization, too lengthy to adequately summarize here.  It took quite a while to convince him to take any appropriate medicines for bipolar disorder at all.  Gradually he was convinced to take some medicines but remained hyperverbal intrusive grandiose for some time.  Gradually his symptoms came under better control  and he became more agreeable to medications.  Particularly after it became clear to him that we were not going to discharge him in the short-term.  We were in contact with his mother who is his legal guardian from the beginning of the hospital stay.  She has always explained that she was unable to take care of the patient at her own home because of his history of aggression to her.  She strongly requested that we refer him to the state hospital again because of the good outcome last time he was there.  [facility] was turned down for [facility].   Meanwhile gradually symptoms improved.  He remains a little peculiar in his thinking and focused on a lot of his "projects" that he works on in his room but has never actually been violent or threatening to anyone here on the unit has never made any indication of suicidal behavior.  Patient became ready for discharge sometime ago but because of the coronavirus situation and because of the patient's unique past history we have been unable to find a group home that would accept him up until this recent time.  We have finally now found a group home that would accept the patient and the mother is willing to make the payments now that she understands we cannot get him into a long-term hospital.  Patient appears to be stable.  Not acutely dangerous.  Agreeable to medicine.  He is now on Abilify maintain a long-acting injection as well as Zyprexa and Depakote.  He will receive outpatient treatment in the community.  Levels have been stable  Physical Findings: AIMS: Facial and Oral Movements Muscles of Facial Expression: None, normal Lips and Perioral Area: None, normal Jaw: None, normal Tongue: None, normal,Extremity Movements Upper (arms, wrists, hands, fingers): None, normal Lower (legs, knees, ankles, toes): None, normal, Trunk Movements Neck, shoulders, hips: None, normal, Overall Severity Severity of abnormal movements (highest score from questions above): None, normal Incapacitation due to abnormal movements: None, normal Patient's awareness of abnormal movements (rate only patient's report): No Awareness, Dental Status Current problems with teeth and/or dentures?: No Does patient usually wear dentures?: No  CIWA:  CIWA-Ar Total: 0 COWS:  COWS Total Score: 0  Musculoskeletal: Strength & Muscle Tone: within normal limits Gait & Station: normal Patient leans: N/A  Psychiatric Specialty Exam: Physical Exam  Nursing note and vitals reviewed. Constitutional: He appears well-developed and  well-nourished.  HENT:  Head: Normocephalic and atraumatic.  Eyes: Pupils are equal, round, and reactive to light. Conjunctivae are normal.  Neck: Normal range of motion.  Cardiovascular: Regular rhythm and normal heart sounds.  Respiratory: Effort normal.  GI: Soft.  Musculoskeletal: Normal range of motion.  Neurological: He is alert.  Skin: Skin is warm and dry.  Psychiatric: He has a normal mood and affect. His speech is normal and behavior is normal. Judgment and thought content normal.    Review of Systems  Constitutional: Negative.   HENT: Negative.   Eyes: Negative.   Respiratory: Negative.   Cardiovascular: Negative.   Gastrointestinal: Negative.   Musculoskeletal: Negative.   Skin: Negative.   Neurological: Negative.   Psychiatric/Behavioral: Negative.     Blood pressure 103/73, pulse 68, temperature 98.1 F (36.7 C), temperature source Oral, resp. rate 16, height 5\' 11"  (1.803 m), weight 88.5 kg, SpO2 100 %.Body mass index is 27.2 kg/m.  General Appearance: Casual  Eye Contact:  Good  Speech:  Clear and Coherent  Volume:  Normal  Mood:  Euthymic  Affect:  Congruent  Thought Process:  Goal Directed  Orientation:  Full (Time, Place, and Person)  Thought Content:  Logical  Suicidal Thoughts:  No  Homicidal Thoughts:  No  Memory:  Immediate;   Fair Recent;   Fair Remote;   Fair  Judgement:  Fair  Insight:  Fair  Psychomotor Activity:  Normal  Concentration:  Concentration: Fair  Recall:  Fair  Fund of Knowledge:  Fair  Language:  Fair  Akathisia:  No  Handed:  Right  AIMS (if indicated):     Assets:  Communication Skills Desire for Improvement Financial Resources/Insurance Housing Resilience Social Support  ADL's:  Intact  Cognition:  WNL  Sleep:  Number of Hours: 5.15     Have you used any form of tobacco in the last 30 days? (Cigarettes, Smokeless Tobacco, Cigars, and/or Pipes): Yes  Has this patient used any form of tobacco in the last 30  days? (Cigarettes, Smokeless Tobacco, Cigars, and/or Pipes) Yes, Yes, A prescription for an FDA-approved tobacco cessation medication was offered at discharge and the patient refused  Blood Alcohol level:  Lab Results  Component Value Date   [facility] <10 10/16/2018    Metabolic Disorder Labs:  Lab Results  Component Value Date   HGBA1C 5.2 10/23/2018   MPG 102.54 10/23/2018   No results found for: PROLACTIN Lab Results  Component Value Date   CHOL 129 10/23/2018   TRIG 58 10/23/2018   HDL 50 10/23/2018   CHOLHDL 2.6 10/23/2018   VLDL 12 10/23/2018   LDLCALC 67 10/23/2018    See Psychiatric Specialty Exam and Suicide Risk Assessment completed by Attending Physician prior to discharge.  Discharge destination:  Home  Is patient on multiple antipsychotic therapies at discharge:  Yes,   Do you recommend tapering to monotherapy for antipsychotics?  No   Has Patient had three or more failed trials of antipsychotic monotherapy by history:  Yes,   Antipsychotic medications that previously failed include:   1.  Abilify., 2.  Zyprexa. and 3.  Haldol.  Recommended Plan for Multiple Antipsychotic Therapies: Additional reason(s) for multiple antispychotic treatment:  As noted above the patient has had trials of multiple antipsychotics without adequate stability.  We had a difficult time this hospitalization getting him stable and it required more than 1 antipsychotic medicine.  It would be poor judgment to try and change anything at this point given his fragility.  Discharge Instructions    Diet - low sodium heart healthy   Complete by:  As directed    Diet - low sodium heart healthy   Complete by:  As directed    Diet - low sodium heart healthy   Complete by:  As directed    Discharge instructions   Complete by:  As directed    [facility] to [facility] for higher level of care   Increase activity slowly   Complete by:  As directed    Increase activity slowly   Complete by:  As directed     Increase activity slowly   Complete by:  As directed      Allergies as of 01/07/2019   No Known Allergies     Medication List    TAKE these medications     Indication  ARIPiprazole ER 400 MG Srer injection Commonly known as:  ABILIFY MAINTENA Inject 2 mLs (400 mg total) into the muscle every 28 (twenty-eight) days.  Indication:  MIXED BIPOLAR AFFECTIVE DISORDER, last dose on 12/08/18   divalproex 500 MG 24 hr tablet Commonly known as:  DEPAKOTE ER Take 2 tablets (1,000 mg total) by mouth at bedtime.  Indication:  MIXED BIPOLAR AFFECTIVE DISORDER   divalproex 500 MG 24 hr tablet Commonly known as:  DEPAKOTE ER Take 3 tablets (1,500 mg total) by mouth at bedtime.  Indication:  Manic Phase of Manic-Depression   hydrOXYzine 50 MG tablet Commonly known as:  ATARAX/VISTARIL Take 1 tablet (50 mg total) by mouth every 4 (four) hours as needed for anxiety.  Indication:  Feeling Anxious   OLANZapine 15 MG tablet Commonly known as:  ZYPREXA Take 2 tablets (30 mg total) by mouth at bedtime.  Indication:  Manic Phase of Manic-Depression   traZODone 100 MG tablet Commonly known as:  DESYREL Take 1 tablet (100 mg total) by mouth at bedtime as needed for sleep.  Indication:  Trouble Sleeping      Follow-up Information    [facility] Follow up.   Contact information: [address] [place] [address] [phone]        [facility] Follow up.   Specialty:  Behavioral Health Why:  on the waiting list Contact information: [address] [facility] [place] [address] [phone]       [facility] Follow up.   Contact information: [address] [place] [address] [phone]        [facility] [place] [employer] Follow up.   Contact information: [address] [place] [address] [phone]           Follow-up recommendations:  Activity:  Activity as  tolerated Diet:  Regular diet Other:  Follow-up outpatient psychiatric care  Comments: Patient calm and pleasant and aware of the plan.  Able to give agreement although he does understand that his mother is still his guardian.  Prescriptions all written and 7-day supply provided.  Signed: Mordecai Rasmussen, MD 01/07/2019, 1:55 PM

## 2019-01-07 NOTE — Progress Notes (Signed)
Recreation Therapy Notes  Date: 01/07/2019   Time: 9:30 am   Location: Craft room   Behavioral response: N/A   Intervention Topic: Relaxation   Discussion/Intervention: Patient did not attend group.   Clinical Observations/Feedback:  Patient did not attend group.   Danecia Underdown LRT/CTRS        Erek Kowal 01/07/2019 10:39 AM 

## 2019-01-07 NOTE — Progress Notes (Signed)
Recreation Therapy Notes  INPATIENT RECREATION TR PLAN  Patient Details Name: Jackson [name] MRN: [id number] DOB: 24-Jun-1986 Today's Date: 01/07/2019  Rec Therapy Plan Is patient appropriate for Therapeutic Recreation?: Yes Treatment times per week: at least 3 Estimated Length of Stay: 5-7 days TR Treatment/Interventions: Group participation (Comment)  Discharge Criteria Pt will be discharged from therapy if:: Discharged Treatment plan/goals/alternatives discussed and agreed upon by:: Patient/family  Discharge Summary Short term goals set: Patient will focus on task/topic with 2 prompts from staff within 5 recreation therapy group Ferrelli Short term goals met: Not met Progress toward goals comments: Groups attended Which groups?: Communication, Goal setting, Leisure education, Coping skills, Other (Comment)(Time management, values, problem solving) Reason goals not met: Patient spent most of his time in his room sleep Therapeutic equipment acquired: N/A Reason patient discharged from therapy: Discharge from hospital Pt/family agrees with progress & goals achieved: Yes Date patient discharged from therapy: 01/07/19   Jackson [name] 01/07/2019, 11:45 AM

## 2019-01-07 NOTE — Plan of Care (Signed)
Pt. Self-control improved. Pt. Monitored for safety and able to remain safe on the unit. Pt. Continues to present with some magical thinking and some grandiosity, but shows much improvement overall.    Problem: Coping: Goal: Ability to demonstrate self-control will improve Outcome: Progressing   Problem: Safety: Goal: Periods of time without injury will increase Outcome: Progressing   Problem: Education: Goal: Will be free of psychotic symptoms Outcome: Progressing

## 2019-01-07 NOTE — [facility] Group Notes (Signed)
LCSW Group Therapy Note  01/07/2019 1:00 PM  Type of Therapy and Topic:  Group Therapy:  Feelings around Relapse and Recovery  Participation Level:  Did Not Attend   Description of Group:    Patients in this group will discuss emotions they experience before and after a relapse. They will process how experiencing these feelings, or avoidance of experiencing them, relates to having a relapse. Facilitator will guide patients to explore emotions they have related to recovery. Patients will be encouraged to process which emotions are more powerful. They will be guided to discuss the emotional reaction significant others in their lives may have to their relapse or recovery. Patients will be assisted in exploring ways to respond to the emotions of others without this contributing to a relapse.  Therapeutic Goals: 1. Patient will identify two or more emotions that lead to a relapse for them 2. Patient will identify two emotions that result when they relapse 3. Patient will identify two emotions related to recovery 4. Patient will demonstrate ability to communicate their needs through discussion and/or role plays   Summary of Patient Progress:  X   Therapeutic Modalities:   Cognitive Behavioral Therapy Solution-Focused Therapy Assertiveness Training Relapse Prevention Therapy   Penni Homans, MSW, LCSW 01/07/2019 12:45 PM

## 2019-01-07 NOTE — Progress Notes (Signed)
D: Pt. During assessments denies si/hi/avh, able to contract for safety. Pt. Endorses a normal mood. Pt. Still continues to have some magical thinking and grandiosity at times, but overall much improved. No behavioral concerns to report.   A: Q x 15 minute observation checks were completed for safety. Patient was provided with education. Patient was given/offered medications per orders. Patient  was encourage to attend groups, participate in unit activities and continue with plan of care. Pt. Chart and plans of care reviewed. Pt. Given support and encouragement.   R: Patient is complaint with medications and unit procedures. Pt. Frequently observed out in the milieu socializing with peers and or staff.             Precautionary checks every 15 minutes for safety maintained, room free of safety hazards, patient sustains no injury or falls during this shift. Will endorse care to next shift.

## 2020-04-11 ENCOUNTER — Encounter: Payer: Self-pay | Admitting: Emergency Medicine

## 2020-04-11 ENCOUNTER — Emergency Department
Admission: EM | Admit: 2020-04-11 | Discharge: 2020-04-13 | Disposition: A | Discharge status: Other Acute Inpatient Hospital | Payer: Medicaid Other | Attending: Emergency Medicine | Admitting: Emergency Medicine

## 2020-04-11 ENCOUNTER — Other Ambulatory Visit: Payer: Self-pay

## 2020-04-11 DIAGNOSIS — F1290 Cannabis use, unspecified, uncomplicated: Secondary | ICD-10-CM | POA: Insufficient documentation

## 2020-04-11 DIAGNOSIS — R451 Restlessness and agitation: Secondary | ICD-10-CM | POA: Diagnosis present

## 2020-04-11 DIAGNOSIS — F17210 Nicotine dependence, cigarettes, uncomplicated: Secondary | ICD-10-CM | POA: Diagnosis not present

## 2020-04-11 DIAGNOSIS — Z79899 Other long term (current) drug therapy: Secondary | ICD-10-CM | POA: Diagnosis not present

## 2020-04-11 DIAGNOSIS — F29 Unspecified psychosis not due to a substance or known physiological condition: Secondary | ICD-10-CM | POA: Insufficient documentation

## 2020-04-11 DIAGNOSIS — F259 Schizoaffective disorder, unspecified: Secondary | ICD-10-CM | POA: Insufficient documentation

## 2020-04-11 DIAGNOSIS — Z20822 Contact with and (suspected) exposure to covid-19: Secondary | ICD-10-CM | POA: Insufficient documentation

## 2020-04-11 DIAGNOSIS — F23 Brief psychotic disorder: Secondary | ICD-10-CM

## 2020-04-11 LAB — COMPREHENSIVE METABOLIC PANEL
ALT: 16 U/L (ref 0–44)
AST: 71 U/L — ABNORMAL HIGH (ref 15–41)
Albumin: 3.8 g/dL (ref 3.5–5.0)
Alkaline Phosphatase: 42 U/L (ref 38–126)
Anion gap: 10 (ref 5–15)
BUN: 19 mg/dL (ref 6–20)
CO2: 24 mmol/L (ref 22–32)
Calcium: 8.7 mg/dL — ABNORMAL LOW (ref 8.9–10.3)
Chloride: 108 mmol/L (ref 98–111)
Creatinine, Ser: 1.71 mg/dL — ABNORMAL HIGH (ref 0.61–1.24)
GFR calc Af Amer: 58 mL/min — ABNORMAL LOW (ref >60)
GFR calc non Af Amer: 50 mL/min — ABNORMAL LOW (ref >60)
Glucose, Bld: 82 mg/dL (ref 70–99)
Potassium: 3.9 mmol/L (ref 3.5–5.1)
Sodium: 142 mmol/L (ref 135–145)
Total Bilirubin: 1.2 mg/dL (ref 0.3–1.2)
Total Protein: 6 g/dL — ABNORMAL LOW (ref 6.5–8.1)

## 2020-04-11 LAB — ETHANOL: Alcohol, Ethyl (B): <10 mg/dL (ref <10)

## 2020-04-11 LAB — ACETAMINOPHEN LEVEL: Acetaminophen (Tylenol), Serum: <10 ug/mL — ABNORMAL LOW (ref 10–30)

## 2020-04-11 LAB — CBC
HCT: 39 % (ref 39.0–52.0)
Hemoglobin: 13.2 g/dL (ref 13.0–17.0)
MCH: 32 pg (ref 26.0–34.0)
MCHC: 33.8 g/dL (ref 30.0–36.0)
MCV: 94.4 fL (ref 80.0–100.0)
Platelets: 249 10*3/uL (ref 150–400)
RBC: 4.13 MIL/uL — ABNORMAL LOW (ref 4.22–5.81)
RDW: 12.9 % (ref 11.5–15.5)
WBC: 8.1 10*3/uL (ref 4.0–10.5)
nRBC: 0 % (ref 0.0–0.2)

## 2020-04-11 LAB — SALICYLATE LEVEL: Salicylate Lvl: <7 mg/dL — ABNORMAL LOW (ref 7.0–30.0)

## 2020-04-11 MED ORDER — BENZTROPINE MESYLATE 1 MG PO TABS
1.0000 mg | ORAL_TABLET | Freq: Two times a day (BID) | ORAL | Status: DC
  Filled 2020-04-11 (×2): qty 1

## 2020-04-11 MED ORDER — HALOPERIDOL LACTATE 5 MG/ML IJ SOLN
5.0000 mg | Freq: Once | INTRAMUSCULAR | Status: AC
  Administered 2020-04-11: 5 mg via INTRAMUSCULAR
  Filled 2020-04-11: qty 1

## 2020-04-11 MED ORDER — IBUPROFEN 600 MG PO TABS: 600.0000 mg | ORAL_TABLET | Freq: Three times a day (TID) | ORAL | Status: DC | PRN

## 2020-04-11 MED ORDER — ONDANSETRON HCL 4 MG PO TABS: 4.0000 mg | ORAL_TABLET | Freq: Three times a day (TID) | ORAL | Status: DC | PRN

## 2020-04-11 MED ORDER — TRAZODONE HCL 100 MG PO TABS
100.0000 mg | ORAL_TABLET | Freq: Every day | ORAL | Status: DC
  Filled 2020-04-11: qty 1

## 2020-04-11 MED ORDER — CLONAZEPAM 0.25 MG PO TBDP
0.2500 mg | ORAL_TABLET | Freq: Two times a day (BID) | ORAL | Status: DC
  Filled 2020-04-11 (×2): qty 1

## 2020-04-11 MED ORDER — DIPHENHYDRAMINE HCL 50 MG/ML IJ SOLN
50.0000 mg | Freq: Once | INTRAMUSCULAR | Status: AC
  Administered 2020-04-11 – 2020-04-12 (×2): 50 mg via INTRAMUSCULAR
  Filled 2020-04-11: qty 1

## 2020-04-11 MED ORDER — LOXAPINE SUCCINATE 5 MG PO CAPS
5.0000 mg | ORAL_CAPSULE | Freq: Two times a day (BID) | ORAL | Status: DC
  Filled 2020-04-11 (×5): qty 1

## 2020-04-11 MED ORDER — LORAZEPAM 2 MG/ML IJ SOLN
2.0000 mg | Freq: Once | INTRAMUSCULAR | Status: AC
  Administered 2020-04-11: 2 mg via INTRAMUSCULAR
  Filled 2020-04-11: qty 1

## 2020-04-11 MED ORDER — ARIPIPRAZOLE 10 MG PO TABS
10.0000 mg | ORAL_TABLET | Freq: Every day | ORAL | Status: DC
  Filled 2020-04-11 (×3): qty 1

## 2020-04-11 NOTE — ED Notes (Signed)
Agitation increasing.  Yelling out frequently.  Closing door to waiting area on LE.  Not cooperative.  Dr. Scotty Court alerted and evaluated patient.  Medicated as per MAR.

## 2020-04-11 NOTE — Consult Note (Addendum)
[facility] Face-to-Face Psychiatry Consult   Reason for Consult:   On IVC  Relapse of severe psychosis /history of schizoaffective disorder ---  History by MOM Jackson [name] is too psychotic and not verbal  He was sedated from meds also     Referring Physician:  ED MD  Jackson [name] Identification: Jackson Jackson [name]     Principal Diagnosis: Schizoaffective Disorder   CC --Jackson [name] non verbal    Diagnosis:  Active Problems:   * No active hospital problems. *  Same: worsening psychosis after stopping meds  Cannot handle as outpatient   Mom is legal guardian   Total Time spent with Jackson [name]:  Close to one hour   Subjective:   Jackson Jackson [name] is a 34 y.o. male Jackson [name] admitted with worsening psychosis and non compliance.   Is supposed to be on Abilify Maintena, Depakote trazodone plus or minus lithium     HPI:   Very long psychotic history since HS ----now with illogical thought disorgnized thought, racing thoughts and speeded actions, mood swings, ups and downs, lability --lack of complete sleep, poor appetite, decrease in Self Care --hearing possible voices, internal distraction and overall strange behaviors.  Ran and wandered away and into streets as well  Unclear if has SI or plans   No HI or plans noted  Mom is legal guardian    Currently on IVC and had to be sedated here --due to severe OOC symptoms and outbursts  Spoke at length with parents   Past Psychiatric History:  Multiple long hospitalizations including [facility] for two years,  [place] over several months. Always has waxing and waning course ---when he does not take his medications   Risk to Self:  not known  Risk to Others:  can have IED symptoms shouting spells  Prior Inpatient Therapy:  multiple since age 17-15 see above  Prior Outpatient Therapy:   none recently  Very poor compliance   Past Medical History:  Past Medical History:  Diagnosis Date  . Bipolar 1 disorder (HCC)   . Schizoaffective  disorder (HCC)    History reviewed. No pertinent surgical history. Family History: No family history on file. Family Psychiatric  History:    Biological dad with history of psychosis and mania   Social History:  Social History   Substance and Sexual Activity  Alcohol Use Not Currently     Social History   Substance and Sexual Activity  Drug Use Not Currently    Social History   Socioeconomic History  . Marital status: Single    Spouse name: Not on file  . Number of children: Not on file  . Years of education: Not on file  . Highest education level: Not on file  Occupational History  . Not on file  Tobacco Use  . Smoking status: Light Tobacco Smoker    Packs/day: 0.25    Types: Cigarettes  . Smokeless tobacco: Never Used  Vaping Use  . Vaping Use: Never used  Substance and Sexual Activity  . Alcohol use: Not Currently  . Drug use: Not Currently  . Sexual activity: Not on file  Other Topics Concern  . Not on file  Social History Narrative  . Not on file   Social Determinants of Health   Financial Resource Strain:   . Difficulty of Paying Living Expenses: Not on file  Food Insecurity:   . Worried About Programme researcher, broadcasting/film/video in the Last Year: Not on file  . Ran Out of Food in  the Last Year: Not on file  Transportation Needs:   . Lack of Transportation (Medical): Not on file  . Lack of Transportation (Non-Medical): Not on file  Physical Activity:   . Days of Exercise per Week: Not on file  . Minutes of Exercise per Session: Not on file  Stress:   . Feeling of Stress : Not on file  Social Connections:   . Frequency of Communication with Friends and Family: Not on file  . Frequency of Social Gatherings with Friends and Family: Not on file  . Attends Religious Services: Not on file  . Active Member of Clubs or Organizations: Not on file  . Attends Banker Meetings: Not on file  . Marital Status: Not on file   Additional Social History:  Court and  legal issues none  Substance drug and ETOH none  Education  ---finished GED   Work --on Disability at this point        Allergies:  No Known Allergies  Labs:  Results for orders placed or performed during the hospital encounter of 04/11/20 (from the past 48 hour(s))  Comprehensive metabolic panel     Status: Abnormal   Collection Time: 04/11/20  1:54 PM  Result Value Ref Range   Sodium 142 135 - 145 mmol/L   Potassium 3.9 3.5 - 5.1 mmol/L   Chloride 108 98 - 111 mmol/L   CO2 24 22 - 32 mmol/L   Glucose, Bld 82 70 - 99 mg/dL    Comment: Glucose reference range applies only to samples taken after fasting for at least 8 hours.   BUN 19 6 - 20 mg/dL   Creatinine, Ser 6.26 (H) 0.61 - 1.24 mg/dL   Calcium 8.7 (L) 8.9 - 10.3 mg/dL   Total Protein 6.0 (L) 6.5 - 8.1 g/dL   Albumin 3.8 3.5 - 5.0 g/dL   AST 71 (H) 15 - 41 U/L   ALT 16 0 - 44 U/L   Alkaline Phosphatase 42 38 - 126 U/L   Total Bilirubin 1.2 0.3 - 1.2 mg/dL   GFR calc non Af Amer 50 (L) >60 mL/min   GFR calc Af Amer 58 (L) >60 mL/min   Anion gap 10 5 - 15    Comment: Performed at [facility], [address] [place], [place] [address]  Ethanol     Status: None   Collection Time: 04/11/20  1:54 PM  Result Value Ref Range   Alcohol, Ethyl (B) <10 <10 mg/dL    Comment: (NOTE) Lowest detectable limit for serum alcohol is 10 mg/dL.  For medical purposes only. Performed at [facility], [address] [place], [place] [address]   Salicylate level     Status: Abnormal   Collection Time: 04/11/20  1:54 PM  Result Value Ref Range   Salicylate Lvl <7.0 (L) 7.0 - 30.0 mg/dL    Comment: Performed at [facility], [address] [place], [place] [address]  Acetaminophen level     Status: Abnormal   Collection Time: 04/11/20  1:54 PM  Result Value Ref Range   Acetaminophen (Tylenol), Serum <10 (L) 10 - 30 ug/mL    Comment: (NOTE) Therapeutic concentrations vary significantly. A range of  10-30 ug/mL  may be an effective concentration for many patients. However, some  are best treated at concentrations outside of this range. Acetaminophen concentrations >150 ug/mL at 4 hours after ingestion  and >50 ug/mL at 12 hours after ingestion are often associated with  toxic reactions.  Performed at [facility], [address] [place], [place] [address]   cbc     Status: Abnormal   Collection Time: 04/11/20  1:54 PM  Result Value Ref Range   WBC 8.1 4.0 - 10.5 K/uL   RBC 4.13 (L) 4.22 - 5.81 MIL/uL   Hemoglobin 13.2 13.0 - 17.0 g/dL   HCT 78.6 39 - 52 %   MCV 94.4 80.0 - 100.0 fL   MCH 32.0 26.0 - 34.0 pg   MCHC 33.8 30.0 - 36.0 g/dL   RDW 76.7 20.9 - 47.0 %   Platelets 249 150 - 400 K/uL   nRBC 0.0 0.0 - 0.2 %    Comment: Performed at [facility], [address], [place], [place] [address]    Current Facility-Administered Medications  Medication Dose Route Frequency Provider Last Rate Last Admin  . ARIPiprazole (ABILIFY) tablet 10 mg  10 mg Oral QHS Roselind Messier, MD      . benztropine (COGENTIN) tablet 1 mg  1 mg Oral BID Roselind Messier, MD      . clonazePAM Scarlette Calico) disintegrating tablet 0.25 mg  0.25 mg Oral BID Roselind Messier, MD      . ibuprofen (ADVIL) tablet 600 mg  600 mg Oral Q8H PRN Sharman Cheek, MD      . loxapine Ocie Bob) capsule 5 mg  5 mg Oral BID Roselind Messier, MD      . ondansetron [facility]) tablet 4 mg  4 mg Oral Q8H PRN Sharman Cheek, MD      . traZODone (DESYREL) tablet 100 mg  100 mg Oral QHS Roselind Messier, MD       Current Outpatient Medications  Medication Sig Dispense Refill  . ARIPiprazole ER (ABILIFY MAINTENA) 400 MG SRER injection Inject 2 mLs (400 mg total) into the muscle every 28 (twenty-eight) days. (Jackson [name] not taking: Reported on 04/11/2020) 1 each 1  . divalproex (DEPAKOTE ER) 500 MG 24 hr tablet Take 1,000 mg by mouth daily. (Jackson [name] not taking: Reported on 04/11/2020)    . OLANZapine  (ZYPREXA) 20 MG tablet Take 20 mg by mouth at bedtime. (Jackson [name] not taking: Reported on 04/11/2020)    . traZODone (DESYREL) 100 MG tablet Take 1 tablet (100 mg total) by mouth at bedtime as needed for sleep. (Jackson [name] not taking: Reported on 04/11/2020) 30 tablet 1    Musculoskeletal: Strength & Muscle Tone:  Currently asleep  Gait & Station: reportedly hyper Jackson [name] leans: n/a   Psychiatric Specialty Exam: Physical Exam  Review of Systems  Blood pressure 133/89, pulse 72, temperature 99.4 F (37.4 C), temperature source Oral, resp. rate 20, height 5\' 11"  (1.803 m), weight 88.5 kg, SpO2 98 %.Body mass index is 27.21 kg/m.    Mental Status  Currently asleep limited  Appearance strange odd unkept                                                      Sleep impaired Assets caring family  Handedness not known Akathisia none Language English  Aims not done yet ADL's impaired when psychotic  Cognition impaired Psychomotor activity ===elevated  Recall poor      Treatment Plan Summary:   AA male with severe schizoaffective relapse very chronic and requires long admissions when Ill.  Mom says he does best with meds prescribed  today and Abilify maintena    Awaits psych admission      Disposition: Recommend psychiatric Inpatient admission when medically cleared.  Roselind Messieramakrishna Rakim Moone, MD 04/11/2020 6:33 PM

## 2020-04-11 NOTE — ED Notes (Signed)
Pt belongings include three black tshirts, one pair sweatpants, one pair black boxers. 1/1 belongings bag.

## 2020-04-11 NOTE — ED Provider Notes (Signed)
[facility] Emergency Department Provider Note  ____________________________________________  Time seen: Approximately 12:50 PM  I have reviewed the triage vital signs and the nursing notes.   HISTORY  Chief Complaint Agitation   Level 5 Caveat: Portions of the History and Physical including HPI and review of systems are unable to be completely obtained due to patient being a poor historian   HPI Jackson [name] is a 34 y.o. male with a history of schizoaffective disorder was brought to the ED under involuntary commitment due to agitation, not sleeping, running around in the street, not taking his medications, paranoia and aggression.  Patient does not provide any history.      Past Medical History:  Diagnosis Date  . Bipolar 1 disorder (HCC)   . Schizoaffective disorder [facility])      Patient Active Problem List   Diagnosis Date Noted  . Noncompliance 10/18/2018  . Schizoaffective disorder, bipolar type (HCC) 10/17/2018     History reviewed. No pertinent surgical history.   Prior to Admission medications   Medication Sig Start Date End Date Taking? Authorizing Provider  ARIPiprazole ER (ABILIFY MAINTENA) 400 MG SRER injection Inject 2 mLs (400 mg total) into the muscle every 28 (twenty-eight) days. 01/07/19   [name], [name], MD  divalproex (DEPAKOTE ER) 500 MG 24 hr tablet Take 2 tablets (1,000 mg total) by mouth at bedtime. 12/09/18   Charm Rings, NP  divalproex (DEPAKOTE ER) 500 MG 24 hr tablet Take 3 tablets (1,500 mg total) by mouth at bedtime. 01/07/19   [name], [name], MD  hydrOXYzine (ATARAX/VISTARIL) 50 MG tablet Take 1 tablet (50 mg total) by mouth every 4 (four) hours as needed for anxiety. 01/07/19   [name], [name], MD  OLANZapine (ZYPREXA) 15 MG tablet Take 2 tablets (30 mg total) by mouth at bedtime. 01/07/19   [name], [name], MD  traZODone (DESYREL) 100 MG tablet Take 1 tablet (100 mg total) by mouth at bedtime as needed for sleep. 01/07/19    [name], [name], MD     Allergies Patient has no known allergies.   No family history on file.  Social History Social History   Tobacco Use  . Smoking status: Light Tobacco Smoker    Packs/day: 0.25    Types: Cigarettes  . Smokeless tobacco: Never Used  Vaping Use  . Vaping Use: Never used  Substance Use Topics  . Alcohol use: Not Currently  . Drug use: Not Currently    Review of Systems Level 5 Caveat: Portions of the History and Physical including HPI and review of systems are unable to be completely obtained due to patient being a poor historian   Constitutional:   No known fever.  ENT:   No rhinorrhea. Cardiovascular:   No chest pain or syncope. Respiratory:   No dyspnea or cough. Gastrointestinal:   Negative for abdominal pain, vomiting and diarrhea.  Musculoskeletal:   Negative for focal pain or swelling ____________________________________________   PHYSICAL EXAM:  VITAL SIGNS: ED Triage Vitals  Enc Vitals Group     BP 04/11/20 1113 133/89     Pulse Rate 04/11/20 1113 72     Resp 04/11/20 1113 20     Temp 04/11/20 1113 99.4 F (37.4 C)     Temp Source 04/11/20 1113 Oral     SpO2 04/11/20 1113 98 %     Weight 04/11/20 1110 195 lb 1.7 oz (88.5 kg)     Height 04/11/20 1110 5\' 11"  (1.803 m)  Head Circumference --      Peak Flow --      Pain Score 04/11/20 1110 0     Pain Loc --      Pain Edu? --      Excl. in GC? --     Vital signs reviewed, nursing assessments reviewed.   Constitutional:   Awake and alert. Non-toxic appearance. Eyes:   Conjunctivae are normal. EOMI. PERRL. ENT      Head:   Normocephalic and atraumatic.      Nose:   No congestion/rhinnorhea.       Mouth/Throat:   MMM, no pharyngeal erythema. No peritonsillar mass.       Neck:   No meningismus. Full ROM. Hematological/Lymphatic/Immunilogical:   No cervical lymphadenopathy. Cardiovascular:   RRR. Respiratory:   Normal respiratory effort without  tachypnea/retractions. Gastrointestinal:   Soft and nontender. Non distended. There is no CVA tenderness.  No rebound, rigidity, or guarding. Musculoskeletal:   Normal range of motion in all extremities. No joint effusions.  No lower extremity tenderness.  No edema. Neurologic:   Normal speech and language.  Motor grossly intact. No acute focal neurologic deficits are appreciated.  Skin:    Skin is warm, dry and intact. No rash noted.  No petechiae, purpura, or bullae.  ____________________________________________    LABS (pertinent positives/negatives) (all labs ordered are listed, but only abnormal results are displayed) Labs Reviewed  COMPREHENSIVE METABOLIC PANEL  ETHANOL  SALICYLATE LEVEL  ACETAMINOPHEN LEVEL  CBC  URINE DRUG SCREEN, QUALITATIVE ([facility] ONLY)   ____________________________________________   EKG    ____________________________________________    RADIOLOGY  No results found.  ____________________________________________   PROCEDURES .Critical Care Performed by: Sharman Cheek, MD Authorized by: Sharman Cheek, MD   Critical care provider statement:    Critical care time (minutes):  32   Critical care time was exclusive of:  Separately billable procedures and treating other patients   Critical care was necessary to treat or prevent imminent or life-threatening deterioration of the following conditions:  CNS failure or compromise   Critical care was time spent personally by me on the following activities:  Development of treatment plan with patient or surrogate, discussions with consultants, evaluation of patient's response to treatment, examination of patient, obtaining history from patient or surrogate, ordering and performing treatments and interventions, ordering and review of laboratory studies, ordering and review of radiographic studies, pulse oximetry, re-evaluation of patient's condition and review of old  charts    ____________________________________________    CLINICAL IMPRESSION / ASSESSMENT AND PLAN / ED COURSE  Medications ordered in the ED: Medications  haloperidol lactate (HALDOL) injection 5 mg (5 mg Intramuscular Given 04/11/20 1252)  LORazepam (ATIVAN) injection 2 mg (2 mg Intramuscular Given 04/11/20 1252)  diphenhydrAMINE (BENADRYL) injection 50 mg (50 mg Intramuscular Given 04/11/20 1251)    Pertinent labs & imaging results that were available during my care of the patient were reviewed by me and considered in my medical decision making (see chart for details).   Bari Co was evaluated in Emergency Department on 04/11/2020 for the symptoms described in the history of present illness. He was evaluated in the context of the global COVID-19 pandemic, which necessitated consideration that the patient might be at risk for infection with the SARS-CoV-2 virus that causes COVID-19. Institutional protocols and algorithms that pertain to the evaluation of patients at risk for COVID-19 are in a state of rapid change based on information released by regulatory bodies including the CDC and federal  and state organizations. These policies and algorithms were followed during the patient's care in the ED.   Patient presents under IVC due to agitation and symptoms of acute psychosis likely related to medication noncompliance.  Will continue IVC.  Due to extreme agitation, patient was given intramuscular Haldol Ativan and Benadryl on initial assessment to calm him and maintain a safe environment for him, staff, and other patients.  The patient has been placed in psychiatric observation due to the need to provide a safe environment for the patient while obtaining psychiatric consultation and evaluation, as well as ongoing medical and medication management to treat the patient's condition.  The patient has been placed under full IVC at this time.   ----------------------------------------- 2:06 PM  on 04/11/2020 -----------------------------------------  Patient now somnolent but arousable to voice.  Normal vitals, good spontaneous breathing.  Will await psychiatry assessment and recommendations when able.      ____________________________________________   FINAL CLINICAL IMPRESSION(S) / ED DIAGNOSES    Final diagnoses:  Acute psychosis [facility])     ED Discharge Orders    None      Portions of this note were generated with dragon dictation software. Dictation errors may occur despite best attempts at proofreading.   Sharman Cheek, MD 04/11/20 [phone]

## 2020-04-11 NOTE — ED Notes (Signed)
INVOLUNTARY with all papers on chart, awaiting TTS/PSYCH consult °

## 2020-04-11 NOTE — BH Assessment (Signed)
Assessment Note  Jackson [name] is an 34 y.o. male. Per triage note: Arrives with BPD under IVC paperwork for ED evaluation.  Per IVC paperwork, patient has history of paranoid schizophrenia and is not taking medications.  Patient has been running out in The street in front of cars and has not slept in 3 days.Patient denies hallucinations and states "I feel extremely fine".  Denies SI/ HI.   Pt presented to the ED involuntarily due to erratic behavior and med noncompliance. Pt was resting upon this writer's arrival. Patient had a sullen mood and an apathetic affect. Pt presented with irrelevant and rambly speech that was barely comprehensible.  When asked what had brought him into the ED the patient stated, "A little piece of [place]." Pt had a soft voice tone and his speech was circumstantial. The patient was drowsy and his judgement/insight was impaired. Pt was unable to articulate any reason as to why his was in the ED and is in denial about his mental illness.  Denies any hallucinations, suicidal/homicidal ideations, or paranoia.   Collateral Contact 1: Mother/Legal Guardian Crystal Engebretson [phone]. Per mother, patient was brought to the hospital due to pt's bizarre behavior and worsening schizoaffective symptomology. Mother reported that the patient's neighbors and roommates report that the patient has not been himself and has been running down the street. Mother reported that the patient lives with his roommate/landlord and had been doing well. Pt stopped taking his medications in February which had caused the changes in his mental status. Mother reported that the patient smokes cigarettes but does not believe that he uses other substances.    Diagnosis: Schizoaffective disorder  Past Medical History:  Past Medical History:  Diagnosis Date  . Bipolar 1 disorder (HCC)   . Schizoaffective disorder (HCC)     History reviewed. No pertinent surgical history.  Family History: No family  history on file.  Social History:  reports that he has been smoking cigarettes. He has been smoking about 0.25 packs per day. He has never used smokeless tobacco. He reports previous alcohol use. He reports previous drug use.  Additional Social History:  Alcohol / Drug Use Pain Medications: See PTA Prescriptions: See PTA History of alcohol / drug use?: Yes Substance #1 Name of Substance 1: Marijuana  CIWA: CIWA-Ar BP: 133/89 Pulse Rate: 72 COWS:    Allergies: No Known Allergies  Home Medications: (Not in a hospital admission)   OB/GYN Status:  No LMP for male patient.  General Assessment Data Location of Assessment: [facility] ED TTS Assessment: In system Is this a Tele or Face-to-Face Assessment?: Face-to-Face Is this an Initial Assessment or a Re-assessment for this encounter?: Initial Assessment Patient Accompanied by:: N/A Language Other than English: No Living Arrangements: Other (Comment) What gender do you identify as?: Male Date Telepsych consult ordered in [facility]: 04/11/20 Time Telepsych consult ordered in [facility]: 1444 Marital status: Single Maiden name: n/a Pregnancy Status: No Living Arrangements: Other (Comment) (Roommate/Landlord) Can pt return to current living arrangement?: Yes Admission Status: Involuntary Petitioner: Police Is patient capable of signing voluntary admission?: Yes Referral Source: Other Insurance type: Medicaid [place]  Medical Screening Exam [facility] Walk-in ONLY) Medical Exam completed: Yes  Crisis Care Plan Living Arrangements: Other (Comment) (Roommate/Landlord) Legal Guardian: Other:, Mother (Crystal Deshotel [phone]) Name of Psychiatrist: None noted Name of Therapist: None Noted  Education Status Is patient currently in school?: No Is the patient employed, unemployed or receiving disability?: Receiving disability income  Risk to self with the past 6 months  Suicidal Ideation: No Has patient been a risk to self within the past 6 months  prior to admission? : No Suicidal Intent: No Has patient had any suicidal intent within the past 6 months prior to admission? : No Is patient at risk for suicide?: No Suicidal Plan?: No Has patient had any suicidal plan within the past 6 months prior to admission? : No Access to Means: No What has been your use of drugs/alcohol within the last 12 months?: None noted Previous Attempts/Gestures: No How many times?: 0 Other Self Harm Risks: None noted Triggers for Past Attempts: None known Intentional Self Injurious Behavior: None Family Suicide History: Unknown Recent stressful life event(s): Conflict (Comment) Persecutory voices/beliefs?: No Depression: No Depression Symptoms: Feeling angry/irritable Substance abuse history and/or treatment for substance abuse?: No Suicide prevention information given to non-admitted patients: Not applicable  Risk to Others within the past 6 months Homicidal Ideation: No Does patient have any lifetime risk of violence toward others beyond the six months prior to admission? : No Thoughts of Harm to Others: No Current Homicidal Intent: No Current Homicidal Plan: No Access to Homicidal Means: No Identified Victim: n.a History of harm to others?: No Assessment of Violence: None Noted Violent Behavior Description: n/a Does patient have access to weapons?: No Criminal Charges Pending?: No Does patient have a court date: No Is patient on probation?: No  Psychosis Hallucinations: None noted Delusions: None noted  Mental Status Report Appearance/Hygiene: Poor hygiene Eye Contact: Poor Motor Activity: Freedom of movement Speech: Incoherent Level of Consciousness: Drowsy, Sedated Mood: Suspicious Affect: Appropriate to circumstance Anxiety Level: None Thought Processes: Irrelevant Judgement: Impaired Orientation: Person, Place, Time, Situation Obsessive Compulsive Thoughts/Behaviors: None  Cognitive Functioning Concentration: Fair Memory:  Recent Impaired, Remote Impaired Is patient IDD: No Insight: Poor Impulse Control: Fair Appetite: Good Have you had any weight changes? : No Change Sleep: Decreased Vegetative Symptoms: None  ADLScreening [facility] Assessment Services) Patient's cognitive ability adequate to safely complete daily activities?: Yes Patient able to express need for assistance with ADLs?: Yes Independently performs ADLs?: Yes (appropriate for developmental age)  Prior Inpatient Therapy Prior Inpatient Therapy: Yes Prior Therapy Dates: 10/2018 Prior Therapy Facilty/Provider(s): [facility] Reason for Treatment: Schizoaffective Disorder  Prior Outpatient Therapy Prior Outpatient Therapy: No Does patient have an ACCT team?: Yes Does patient have Intensive In-House Services?  : No Does patient have [facility] services? : No Does patient have P4CC services?: No  ADL Screening (condition at time of admission) Patient's cognitive ability adequate to safely complete daily activities?: Yes Is the patient deaf or have difficulty hearing?: No Does the patient have difficulty seeing, even when wearing glasses/contacts?: No Does the patient have difficulty concentrating, remembering, or making decisions?: No Patient able to express need for assistance with ADLs?: Yes Does the patient have difficulty dressing or bathing?: No Independently performs ADLs?: Yes (appropriate for developmental age) Does the patient have difficulty walking or climbing stairs?: No Weakness of Legs: None Weakness of Arms/Hands: None  Home Assistive Devices/Equipment Home Assistive Devices/Equipment: None  Therapy Consults (therapy consults require a physician order) PT Evaluation Needed: No OT Evalulation Needed: No SLP Evaluation Needed: No Abuse/Neglect Assessment (Assessment to be complete while patient is alone) Abuse/Neglect Assessment Can Be Completed: Unable to assess, patient is non-responsive or altered mental status Values /  Beliefs Cultural Requests During Hospitalization: None Spiritual Requests During Hospitalization: None Consults Spiritual Care Consult Needed: No Transition of Care Team Consult Needed: No Advance Directives (For Healthcare) Does Patient Have a  Medical Advance Directive?: No Would patient like information on creating a medical advance directive?: No - Patient declined          Disposition: Per Dr. Smith Robert pt has been recommended for inpatient treatment.  Disposition Initial Assessment Completed for this Encounter: Yes  On Site Evaluation by:   Reviewed with Physician:    Foy Guadalajara 04/11/2020 9:10 PM

## 2020-04-11 NOTE — BH Assessment (Signed)
Referral information for Psychiatric Hospitalization faxed to;   [facility] ([phone]-or- [phone]),    [facility] ([phone]),   [facility] ([phone]),    [facility] ([phone] -or- [phone]),    [facility] ([phone])   [facility] ([phone]--[phone])  

## 2020-04-11 NOTE — ED Triage Notes (Signed)
Arrives with BPD under IVC paperwork for ED evaluation.  Per IVC paperwork, patietn has history of paranoid schizopherneia and is not taking medications.  Patient has been running out in  The street in front of cars and has not slept in 3 days.  Patient denies hallucinations and states "I feel extremely fine".  Denies SI/ HI.

## 2020-04-11 NOTE — ED Notes (Signed)
Patient is paranoid.  Asking "how do you know me"  When I explained that I am reviewing his medical history from his medical record he stated "I am not in there".  Patient also refusing blood draw.  States "the doctor does not need that".

## 2020-04-11 NOTE — ED Notes (Signed)
Pt refusing to take oral medications for this RN. Pt states "I dont take no medicine, nothings wrong with me"

## 2020-04-12 MED ORDER — LORAZEPAM 2 MG PO TABS: 2.0000 mg | ORAL_TABLET | Freq: Four times a day (QID) | ORAL | Status: DC | PRN

## 2020-04-12 MED ORDER — DIPHENHYDRAMINE HCL 25 MG PO CAPS: 50.0000 mg | ORAL_CAPSULE | Freq: Four times a day (QID) | ORAL | Status: DC | PRN

## 2020-04-12 MED ORDER — LORAZEPAM 2 MG/ML IJ SOLN
2.0000 mg | Freq: Four times a day (QID) | INTRAMUSCULAR | Status: DC | PRN
  Administered 2020-04-12: 2 mg via INTRAMUSCULAR
  Filled 2020-04-12 (×2): qty 1

## 2020-04-12 MED ORDER — DIPHENHYDRAMINE HCL 50 MG/ML IJ SOLN
50.0000 mg | Freq: Four times a day (QID) | INTRAMUSCULAR | Status: DC | PRN
  Filled 2020-04-12 (×2): qty 1

## 2020-04-12 MED ORDER — DROPERIDOL 2.5 MG/ML IJ SOLN: 5.0000 mg | Freq: Once | INTRAMUSCULAR | Status: DC

## 2020-04-12 MED ORDER — LORAZEPAM 2 MG/ML IJ SOLN: 2.0000 mg | Freq: Once | INTRAMUSCULAR | Status: DC

## 2020-04-12 MED ORDER — OLANZAPINE 5 MG PO TABS: 15.0000 mg | ORAL_TABLET | Freq: Three times a day (TID) | ORAL | Status: DC | PRN

## 2020-04-12 MED ORDER — ZIPRASIDONE MESYLATE 20 MG IM SOLR
20.0000 mg | Freq: Four times a day (QID) | INTRAMUSCULAR | Status: DC | PRN
  Administered 2020-04-12: 20 mg via INTRAMUSCULAR
  Filled 2020-04-12 (×2): qty 20

## 2020-04-12 MED ORDER — DROPERIDOL 2.5 MG/ML IJ SOLN
INTRAMUSCULAR | Status: AC
  Filled 2020-04-12: qty 2

## 2020-04-12 MED ORDER — LORAZEPAM 2 MG/ML IJ SOLN
INTRAMUSCULAR | Status: AC
  Filled 2020-04-12: qty 1

## 2020-04-12 NOTE — ED Notes (Signed)
Pt becoming very agitated coming to the door.  When asked what's wrong patient starts yelling about racism, not getting food yet throwing the styrofoam containers of food and partially eaten food in the room, banging on the door and stomping his feet.  Dr. Don Perking came to room attempting to talk to patient and even pulling up chart to show patient the plan for him but patient continues to yell over MD.  Security present at the door.  Medications with VORB pulled and ready.  Sue Lush, RN talking to patient and patient somewhat comprehending and agreeing to terms to lower voice and calm down.  Patient quiet at this time and standing at door.

## 2020-04-12 NOTE — BH Assessment (Addendum)
Referral information for Psychiatric Hospitalization faxed to;   [facility] ([phone]- [phone]), No current adult beds available   [facility] ([phone]),No answer at any numbers listed   [facility] [phone]), No answer   [facility] [phone] -[phone]), Was told to contact back after [facility] [phone])   [facility] ([phone]--[phone]) No intake staff available until after 7am

## 2020-04-12 NOTE — ED Provider Notes (Signed)
Emergency Medicine Observation Re-evaluation Note  Jackson [name] is a 34 y.o. male, seen on rounds today.  Pt initially presented to the ED for complaints of No chief complaint on file. Currently, the patient is calm, cooperative, in NAD.  Physical Exam  BP 133/89 (BP Location: Left Arm)   Pulse 72   Temp 99.4 F (37.4 C) (Oral)   Resp 20   Ht 5\' 11"  (1.803 m)   Wt 88.5 kg   SpO2 98%   BMI 27.21 kg/m  Physical Exam General: calm, cooperative, in NAD Cardiac: well perfused Lungs: normal WOB, in no distress Psych: calm  ED Course / MDM  EKG:    I have reviewed the labs performed to date as well as medications administered while in observation.  Recent changes in the last 24 hours include none.  Plan  Current plan is for psych disposition. Patient is under full IVC at this time.   , MD 04/12/20 [phone]

## 2020-04-12 NOTE — BH Assessment (Addendum)
Writer followed up with referrals:  [facility] ([phone]-or- [phone]), No beds   [facility] ([phone]---[phone]---[phone]),   [facility] ([phone]), Refaxed referral to [facility] at 1:15am on 04/13/20   [facility] [phone] -or- [phone]),Denied   [facility] [phone])   [facility] [phone]), unable to reach anyone.

## 2020-04-12 NOTE — Progress Notes (Signed)
[facility] MD Progress Note  04/12/2020 1:38 PM Jackson [name]  MRN:  [id number] Subjective:  Patient is very psychotic and OOC    Principal Problem: <principal problem not specified> Diagnosis: Active Problems:   * No active hospital problems. *   Schizoaffective Disorder     Total Time spent with patient: 40-50 min  Past Psychiatric History: already written   Past Medical History:  Past Medical History:  Diagnosis Date  . Bipolar 1 disorder (HCC)   . Schizoaffective disorder (HCC)    History reviewed. No pertinent surgical history. Family History: No family history on file. Family Psychiatric  History: already noted Social History:  Social History   Substance and Sexual Activity  Alcohol Use Not Currently     Social History   Substance and Sexual Activity  Drug Use Not Currently    Social History   Socioeconomic History  . Marital status: Single    Spouse name: Not on file  . Number of children: Not on file  . Years of education: Not on file  . Highest education level: Not on file  Occupational History  . Not on file  Tobacco Use  . Smoking status: Light Tobacco Smoker    Packs/day: 0.25    Types: Cigarettes  . Smokeless tobacco: Never Used  Vaping Use  . Vaping Use: Never used  Substance and Sexual Activity  . Alcohol use: Not Currently  . Drug use: Not Currently  . Sexual activity: Not on file  Other Topics Concern  . Not on file  Social History Narrative  . Not on file   Social Determinants of Health   Financial Resource Strain:   . Difficulty of Paying Living Expenses: Not on file  Food Insecurity:   . Worried About Programme researcher, broadcasting/film/video in the Last Year: Not on file  . Ran Out of Food in the Last Year: Not on file  Transportation Needs:   . Lack of Transportation (Medical): Not on file  . Lack of Transportation (Non-Medical): Not on file  Physical Activity:   . Days of Exercise per Week: Not on file  . Minutes of Exercise per Session: Not on  file  Stress:   . Feeling of Stress : Not on file  Social Connections:   . Frequency of Communication with Friends and Family: Not on file  . Frequency of Social Gatherings with Friends and Family: Not on file  . Attends Religious Services: Not on file  . Active Member of Clubs or Organizations: Not on file  . Attends Banker Meetings: Not on file  . Marital Status: Not on file   Additional Social History:    Pain Medications: See PTA Prescriptions: See PTA History of alcohol / drug use?: Yes Name of Substance 1: Marijuana                  Sleep: poor   Appetite:  Poor   Current Medications: Current Facility-Administered Medications  Medication Dose Route Frequency Provider Last Rate Last Admin  . ARIPiprazole (ABILIFY) tablet 10 mg  10 mg Oral QHS Roselind Messier, MD      . benztropine (COGENTIN) tablet 1 mg  1 mg Oral BID Roselind Messier, MD      . clonazePAM Scarlette Calico) disintegrating tablet 0.25 mg  0.25 mg Oral BID Roselind Messier, MD      . diphenhydrAMINE (BENADRYL) capsule 50 mg  50 mg Oral Q6H PRN Roselind Messier, MD       Or  .  diphenhydrAMINE (BENADRYL) injection 50 mg  50 mg Intramuscular Q6H PRN Roselind Messier, MD      . ibuprofen (ADVIL) tablet 600 mg  600 mg Oral Q8H PRN Sharman Cheek, MD      . LORazepam (ATIVAN) tablet 2 mg  2 mg Oral Q6H PRN Roselind Messier, MD       Or  . LORazepam (ATIVAN) injection 2 mg  2 mg Intramuscular Q6H PRN Roselind Messier, MD   2 mg at 04/12/20 1255  . loxapine (LOXITANE) capsule 5 mg  5 mg Oral BID Roselind Messier, MD      . OLANZapine [facility]) tablet 15 mg  15 mg Oral TID PRN Roselind Messier, MD      . ondansetron [facility]) tablet 4 mg  4 mg Oral Q8H PRN Sharman Cheek, MD      . traZODone (DESYREL) tablet 100 mg  100 mg Oral QHS Roselind Messier, MD      . ziprasidone (GEODON) injection 20 mg  20 mg Intramuscular Q6H PRN Roselind Messier, MD   20 mg at 04/12/20 1255   Current Outpatient  Medications  Medication Sig Dispense Refill  . ARIPiprazole ER (ABILIFY MAINTENA) 400 MG SRER injection Inject 2 mLs (400 mg total) into the muscle every 28 (twenty-eight) days. (Patient not taking: Reported on 04/11/2020) 1 each 1  . divalproex (DEPAKOTE ER) 500 MG 24 hr tablet Take 1,000 mg by mouth daily. (Patient not taking: Reported on 04/11/2020)    . OLANZapine (ZYPREXA) 20 MG tablet Take 20 mg by mouth at bedtime. (Patient not taking: Reported on 04/11/2020)    . traZODone (DESYREL) 100 MG tablet Take 1 tablet (100 mg total) by mouth at bedtime as needed for sleep. (Patient not taking: Reported on 04/11/2020) 30 tablet 1    Lab Results:  Results for orders placed or performed during the hospital encounter of 04/11/20 (from the past 48 hour(s))  Comprehensive metabolic panel     Status: Abnormal   Collection Time: 04/11/20  1:54 PM  Result Value Ref Range   Sodium 142 135 - 145 mmol/L   Potassium 3.9 3.5 - 5.1 mmol/L   Chloride 108 98 - 111 mmol/L   CO2 24 22 - 32 mmol/L   Glucose, Bld 82 70 - 99 mg/dL    Comment: Glucose reference range applies only to samples taken after fasting for at least 8 hours.   BUN 19 6 - 20 mg/dL   Creatinine, Ser 4.40 (H) 0.61 - 1.24 mg/dL   Calcium 8.7 (L) 8.9 - 10.3 mg/dL   Total Protein 6.0 (L) 6.5 - 8.1 g/dL   Albumin 3.8 3.5 - 5.0 g/dL   AST 71 (H) 15 - 41 U/L   ALT 16 0 - 44 U/L   Alkaline Phosphatase 42 38 - 126 U/L   Total Bilirubin 1.2 0.3 - 1.2 mg/dL   GFR calc non Af Amer 50 (L) >60 mL/min   GFR calc Af Amer 58 (L) >60 mL/min   Anion gap 10 5 - 15    Comment: Performed at [facility], [address] [place], [place] [address]  Ethanol     Status: None   Collection Time: 04/11/20  1:54 PM  Result Value Ref Range   Alcohol, Ethyl (B) <10 <10 mg/dL    Comment: (NOTE) Lowest detectable limit for serum alcohol is 10 mg/dL.  For medical purposes only. Performed at [facility], [address], [place], [place] [address]    Salicylate level  Status: Abnormal   Collection Time: 04/11/20  1:54 PM  Result Value Ref Range   Salicylate Lvl <7.0 (L) 7.0 - 30.0 mg/dL    Comment: Performed at [facility], [address] [place], [place] [address]  Acetaminophen level     Status: Abnormal   Collection Time: 04/11/20  1:54 PM  Result Value Ref Range   Acetaminophen (Tylenol), Serum <10 (L) 10 - 30 ug/mL    Comment: (NOTE) Therapeutic concentrations vary significantly. A range of 10-30 ug/mL  may be an effective concentration for many patients. However, some  are best treated at concentrations outside of this range. Acetaminophen concentrations >150 ug/mL at 4 hours after ingestion  and >50 ug/mL at 12 hours after ingestion are often associated with  toxic reactions.  Performed at [facility], [address] [place], [place] [address]   cbc     Status: Abnormal   Collection Time: 04/11/20  1:54 PM  Result Value Ref Range   WBC 8.1 4.0 - 10.5 K/uL   RBC 4.13 (L) 4.22 - 5.81 MIL/uL   Hemoglobin 13.2 13.0 - 17.0 g/dL   HCT 09.839.0 39 - 52 %   MCV 94.4 80.0 - 100.0 fL   MCH 32.0 26.0 - 34.0 pg   MCHC 33.8 30.0 - 36.0 g/dL   RDW 11.912.9 14.711.5 - 82.915.5 %   Platelets 249 150 - 400 K/uL   nRBC 0.0 0.0 - 0.2 %    Comment: Performed at [facility], [address] [place], [place] [address]    Blood Alcohol level:  Lab Results  Component Value Date   [facility] <10 04/11/2020   ETH <10 10/16/2018    Metabolic Disorder Labs: Lab Results  Component Value Date   HGBA1C 5.2 10/23/2018   MPG 102.54 10/23/2018   No results found for: PROLACTIN Lab Results  Component Value Date   CHOL 129 10/23/2018   TRIG 58 10/23/2018   HDL 50 10/23/2018   CHOLHDL 2.6 10/23/2018   VLDL 12 10/23/2018   LDLCALC 67 10/23/2018    Physical Findings: AIMS:  , ,  ,  ,   cannot be done yet  CIWA:    COWS:     Musculoskeletal: Strength & Muscle Tone: tense hyper stressed  Gait & Station:  Speeded   Patient leans:  N/a   Handedness not known Assets caring family  Liabilities non compliance Recall poor Leans not known Psychomotor agitated Akathisia none Sleep poor  Cognition poor  Language --not clear has severe LOA and FOI disorganized speech    Mental Status Limited due to agitation Oriented times two Loud pressured -threatening posturing Rapport and eye contact poor Appearance Unkept -- Shakes tics tremors not seen Judgement insight reliability all poor Intelligence and fund of knowledge reportedly normal No SI or HI but makes verbal escalations  Memory cannot assess Concentration and attention poor Consciousness not clouded or fluctuant   Very agitated with LOA FOI and illogical thoughts /disorganized thoughts and actions, paranoid fearful pacing back and forth,, yelling at WES[employer] InternationalMom on phone etc.    IM meds GEODON, Benadryl and Ativan had to be given -----Now patient is sedated   Psychiatric Specialty Exam: Physical Exam  Review of Systems  Blood pressure 133/89, pulse 72, temperature 99.4 F (37.4 C), temperature source Oral, resp. rate 20, height 5\' 11"  (1.803 m), weight 88.5 kg, SpO2 98 %.Body mass index is 27.21 kg/m.  Treatment Plan Summary:  Severely ILL  AA male with long history of S/A disorder ---now needing IM meds and awaits bed transfer on IVC   Currently Sedated   Guardian is aware of the issue at hand    Roselind Messier, MD 04/12/2020, 1:38 PM

## 2020-04-12 NOTE — ED Notes (Signed)
Patient with odd behavior standing at doorway acting like if he were a digital person. Patient speaking with no clear thought, get angry and paces in his room. Patient refused medications tthis morning. Reports to writer he does not take medications. "Medications are bad for his body".

## 2020-04-12 NOTE — ED Notes (Signed)
This RN introduced herself to patient. Patient given meal at this time-- cheeseburger, fries, cookies, and apple juice. Patient is adamant with this RN that he needs a phone to talk to his mother and that the previous RN states that he could talk on the phone after it's done charging. Pt given phone at this time with the understanding between both the patient and the nurse that he would use it for 10 minutes.

## 2020-04-12 NOTE — ED Notes (Addendum)
pateint up at door requesting phone. dinner tray provided.

## 2020-04-12 NOTE — ED Notes (Signed)
Patient is refusing vital signs at this time.

## 2020-04-12 NOTE — BH Assessment (Addendum)
Writer followed up with referrals:  [facility] ([phone]-or- [phone]), No beds   [facility] [phone]),   [facility] ([phone]),    [facility] [phone] -[phone]), Pending review   [facility] [phone])   [facility] [phone]), unable to reach anyone.

## 2020-04-12 NOTE — ED Notes (Signed)
IM meds ordered Patient was cooperative when asked to go in room and have a seat for his medications. Right deltoid used for IM medications.

## 2020-04-12 NOTE — ED Notes (Signed)
This RN attempts to get a covid swab on this patient in order to send to [facility]. This RN explains to the patient that he will have to be swabbed sooner or later due to placement options. Patient refuses, states that if we attempt to swab him that he 'will fight [place]'. Charge nurse notified.

## 2020-04-12 NOTE — ED Notes (Signed)
Caitlyn, tech noticed that there is a hole in the wall that appears as though the patient kicked through it. Noted by this RN on the wall closest to the hall way on the left side of the door if inside the room.

## 2020-04-12 NOTE — ED Notes (Signed)
Assumed care of patient patient up in bed stating he is cold and hungry, warm blanket provided, box meal given. Patient ate 100%. When asked why is he in Ed, patient tells the Clinical research associate " because of LOVE", when asked what he means by that he went of repeating that he lives with 2 room mates, and that he is independently working on Dispensing optician. When asked what exactly is the game he is creating about? Patient reports his is creating a fun game and it involves shooting and guns. Then patient stated the cops keep coming to his house and going into his apartment and moving things around. Reports he asked them to leave his property".

## 2020-04-13 LAB — SARS CORONAVIRUS 2 BY RT PCR (HOSPITAL ORDER, PERFORMED IN ~~LOC~~ HOSPITAL LAB): SARS Coronavirus 2: NEGATIVE

## 2020-04-13 MED ORDER — HALOPERIDOL LACTATE 5 MG/ML IJ SOLN
INTRAMUSCULAR | Status: AC
  Filled 2020-04-13: qty 1

## 2020-04-13 MED ORDER — DROPERIDOL 2.5 MG/ML IJ SOLN
5.0000 mg | Freq: Once | INTRAMUSCULAR | Status: DC
  Filled 2020-04-13: qty 2

## 2020-04-13 NOTE — ED Notes (Signed)
Pt back in room, provided with food by Dr. Manson Passey and warm blanket, pt shuts door and is back in bed

## 2020-04-13 NOTE — BH Assessment (Signed)
Late Entry Writer spoke with the patient to complete an updated/reassessment. Patient denies SI/HI and AV/H. However, continues to have delusional thinking and paranoia. Patient witnessed responding to internal stimuli, prior to writer entering his room.
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
## 2020-04-13 NOTE — ED Notes (Signed)
Pt refused vital signs. VS re-entered from this morning.

## 2020-04-13 NOTE — ED Notes (Signed)
This nurse assumes care of pt, pt in room and asleep in bed at this time.

## 2020-04-13 NOTE — ED Notes (Signed)
Pt is adamantly refusing to accept COVID swab. States that he will fight no matter what and is aggressive. Dr. Manson Passey states will not swab him for COVID and risk multiple individuals safety. States it is not medically necessary and very uncalled for to risk staff safety at this time. States if pt remains agitated and aggressive despite not swabbing pt then we will administer medication. Dr. Manson Passey updates TTS at this time and is now speaking to pt.

## 2020-04-13 NOTE — BH Assessment (Signed)
PATIENT BED AVAILABLE AT 8AM ON 04/13/20  Patient has been accepted to [facility].  Patient assigned to [facility] Accepting physician is Dr. Estill Cotta.  Call report to [phone].  Representative was Pax.   ER Staff is aware of it:  [facility] ER Secretary  Dr. Manson Passey, ER MD  Thayer Ohm Patient's Nurse     Address: [address]  [place] [address]  PLEASE INCLUDE DISCHARGE SUMMARY WITH PATIENT WHEN TRANPSORTING

## 2020-04-13 NOTE — ED Notes (Signed)
Pt states "i'm allergic to all psychotropics" and refuses medications.

## 2020-04-29 ENCOUNTER — Emergency Department
Admission: EM | Admit: 2020-04-29 | Discharge: 2020-05-01 | Disposition: A | Discharge status: Home / Self Care | Payer: Medicaid Other | Attending: Emergency Medicine | Admitting: Emergency Medicine

## 2020-04-29 ENCOUNTER — Encounter: Payer: Self-pay | Admitting: Emergency Medicine

## 2020-04-29 ENCOUNTER — Emergency Department: Payer: Medicaid Other

## 2020-04-29 ENCOUNTER — Inpatient Hospital Stay: Admit: 2020-04-29 | Payer: Medicaid Other | Source: Ambulatory Visit | Admitting: Psychiatry

## 2020-04-29 ENCOUNTER — Other Ambulatory Visit: Payer: Self-pay

## 2020-04-29 DIAGNOSIS — Z20822 Contact with and (suspected) exposure to covid-19: Secondary | ICD-10-CM | POA: Diagnosis not present

## 2020-04-29 DIAGNOSIS — R462 Strange and inexplicable behavior: Secondary | ICD-10-CM | POA: Diagnosis present

## 2020-04-29 DIAGNOSIS — R519 Headache, unspecified: Secondary | ICD-10-CM | POA: Diagnosis not present

## 2020-04-29 DIAGNOSIS — F200 Paranoid schizophrenia: Secondary | ICD-10-CM | POA: Insufficient documentation

## 2020-04-29 DIAGNOSIS — S12000A Unspecified displaced fracture of first cervical vertebra, initial encounter for closed fracture: Secondary | ICD-10-CM | POA: Insufficient documentation

## 2020-04-29 DIAGNOSIS — F17210 Nicotine dependence, cigarettes, uncomplicated: Secondary | ICD-10-CM | POA: Diagnosis not present

## 2020-04-29 LAB — CBC
HCT: 42.2 % (ref 39.0–52.0)
Hemoglobin: 14.1 g/dL (ref 13.0–17.0)
MCH: 32 pg (ref 26.0–34.0)
MCHC: 33.4 g/dL (ref 30.0–36.0)
MCV: 95.7 fL (ref 80.0–100.0)
Platelets: 292 10*3/uL (ref 150–400)
RBC: 4.41 MIL/uL (ref 4.22–5.81)
RDW: 12.8 % (ref 11.5–15.5)
WBC: 9.7 10*3/uL (ref 4.0–10.5)
nRBC: 0 % (ref 0.0–0.2)

## 2020-04-29 LAB — COMPREHENSIVE METABOLIC PANEL
ALT: 16 U/L (ref 0–44)
AST: 44 U/L — ABNORMAL HIGH (ref 15–41)
Albumin: 4.4 g/dL (ref 3.5–5.0)
Alkaline Phosphatase: 54 U/L (ref 38–126)
Anion gap: 11 (ref 5–15)
BUN: 23 mg/dL — ABNORMAL HIGH (ref 6–20)
CO2: 24 mmol/L (ref 22–32)
Calcium: 9.2 mg/dL (ref 8.9–10.3)
Chloride: 108 mmol/L (ref 98–111)
Creatinine, Ser: 1.52 mg/dL — ABNORMAL HIGH (ref 0.61–1.24)
GFR calc Af Amer: >60 mL/min (ref >60)
GFR calc non Af Amer: 58 mL/min — ABNORMAL LOW (ref >60)
Glucose, Bld: 92 mg/dL (ref 70–99)
Potassium: 3.6 mmol/L (ref 3.5–5.1)
Sodium: 143 mmol/L (ref 135–145)
Total Bilirubin: 2 mg/dL — ABNORMAL HIGH (ref 0.3–1.2)
Total Protein: 7.3 g/dL (ref 6.5–8.1)

## 2020-04-29 LAB — RESPIRATORY PANEL BY RT PCR (FLU A&B, COVID)
Influenza A by PCR: NEGATIVE
Influenza B by PCR: NEGATIVE
SARS Coronavirus 2 by RT PCR: NEGATIVE

## 2020-04-29 LAB — ACETAMINOPHEN LEVEL: Acetaminophen (Tylenol), Serum: <10 ug/mL — ABNORMAL LOW (ref 10–30)

## 2020-04-29 LAB — ETHANOL: Alcohol, Ethyl (B): <10 mg/dL (ref <10)

## 2020-04-29 LAB — SALICYLATE LEVEL: Salicylate Lvl: <7 mg/dL — ABNORMAL LOW (ref 7.0–30.0)

## 2020-04-29 MED ORDER — ZIPRASIDONE MESYLATE 20 MG IM SOLR
10.0000 mg | Freq: Once | INTRAMUSCULAR | Status: AC
  Administered 2020-04-29: 10 mg via INTRAMUSCULAR
  Filled 2020-04-29: qty 20

## 2020-04-29 MED ORDER — TRAZODONE HCL 100 MG PO TABS
100.0000 mg | ORAL_TABLET | Freq: Every evening | ORAL | Status: DC | PRN
  Filled 2020-04-29: qty 1

## 2020-04-29 MED ORDER — DIVALPROEX SODIUM 500 MG PO DR TAB
500.0000 mg | DELAYED_RELEASE_TABLET | Freq: Two times a day (BID) | ORAL | Status: DC
  Administered 2020-04-29: 500 mg via ORAL
  Filled 2020-04-29 (×3): qty 1

## 2020-04-29 MED ORDER — ZIPRASIDONE MESYLATE 20 MG IM SOLR
10.0000 mg | Freq: Once | INTRAMUSCULAR | Status: AC
  Administered 2020-04-29: 10 mg via INTRAMUSCULAR

## 2020-04-29 MED ORDER — OLANZAPINE 5 MG PO TABS
15.0000 mg | ORAL_TABLET | Freq: Two times a day (BID) | ORAL | Status: DC
  Administered 2020-04-29: 15 mg via ORAL
  Filled 2020-04-29 (×2): qty 1

## 2020-04-29 NOTE — ED Notes (Addendum)
Patient standing in corner of room eating diner.
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
## 2020-04-29 NOTE — ED Notes (Signed)
Patient started banging the wall in his room and when officer and staff asked him what he needs he went to the bathroom and banged on the wall again. Will keep monitoring.

## 2020-04-29 NOTE — ED Notes (Signed)
Hourly rounding reveals patient in room. No complaints, stable, in no acute distress. Q15 minute rounds and monitoring via Rover and Officer to continue.   
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
## 2020-04-29 NOTE — ED Provider Notes (Signed)
-----------------------------------------   3:07 PM on 04/29/2020 -----------------------------------------  I took over care this patient from Dr. Dolores Frame.  The patient has been poorly compliant with the cervical collar, however given that he is relatively calm and not actively trying to harm himself or doing anything specific to injure himself, there is no indication for sedation just to help him keep the collar on.  The neurosurgery MD on call requested a flexion extension x-ray, which was obtained.  He will come to see the patient.  He had also initially requested an MRI, however given the patient's schizophrenia and behavioral issues, this would not be feasible without sedation, so we agreed that it is not emergently indicated at this time.  The patient is pending in person consultation from neurosurgery, and disposition based on psychiatry team recommendations.  I have signed him out to the oncoming physician Dr. Erma Heritage.     Dionne Bucy, MD 04/29/20 [phone]

## 2020-04-29 NOTE — ED Notes (Signed)
Pt refusing to wear miami J collar, states it is cutting off his air flow, pt having auditory hallucinations, informed Dr. Marisa Severin.
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
## 2020-04-29 NOTE — ED Notes (Signed)
Report to include Situation, Background, Assessment, and Recommendations received from Jeannette RN. Patient alert and oriented, warm and dry, in no acute distress. Patient denies SI, HI, AVH and pain. Patient made aware of Q15 minute rounds and Rover and Officer presence for their safety. Patient instructed to come to me with needs or concerns.   
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
## 2020-04-29 NOTE — BH Assessment (Addendum)
*  Per Neurosurgery consult note, pt will not need surgery*  Patient is to be admitted to [facility] by Psychiatric Nurse Practitioner Lodema Pilot, D..  Attending Physician will be Dr. Toni Amend.   Patient has been assigned to room 310, by [facility] Charge Nurse Tye Maryland.   Intake Paper Work has been signed and placed on patient chart.  ER staff is aware of the admission:  Melody, ER Secretary    Dr. Don Perking, ER MD   Lacinda Axon, Patient's Nurse   Rosey Bath, Patient Access.  Pt can be transported after 8pm on 04/30/20.

## 2020-04-29 NOTE — BH Assessment (Deleted)
PATIENT BED AVAILABLE AFTER 9:30AM AND AFTER COVID RESULTS HAVE RESULTED IN NEGATIVE  Patient is to be admitted to [facility] by Psychiatric Nurse Practitioner Lerry Liner. Attending Physician will be Dr. Toni Amend.   Patient has been assigned to room 310, by [facility] Charge Nurse Britta Mccreedy.   Intake Paper Work has been signed and placed on patient chart.  ER staff is aware of the admission:  [facility] ER Secretary    Dr. Dolores Frame, ER MD   Thayer Ohm Patient's Nurse   Nicole Cella Patient Access.   Patient's legal guardian Crystal Biglow [phone] has been updated and is receptive

## 2020-04-29 NOTE — ED Notes (Signed)
IVC  has c collar unable to go to BMU pending placement
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
## 2020-04-29 NOTE — ED Notes (Signed)
[place] J collar applied prior to start of AM shift.
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
## 2020-04-29 NOTE — ED Notes (Signed)
Pt dressed out from gray sweater, black pants, brown undershirt, gray t-shirt and underwear into hospital provided attire.

## 2020-04-29 NOTE — ED Notes (Signed)
Lunch provided.

## 2020-04-29 NOTE — ED Notes (Signed)
Pt. Transferred from Triage to room 23 after dressing out and screening for contraband. Report to include Situation, Background, Assessment and Recommendations from RN. Pt. Oriented to Quad including Q15 minute rounds as well as Psychologist, counselling for their protection. Patient is alert and oriented, warm and dry in no acute distress. Patient denies SI, HI, and AVH. Pt. Encouraged to let this nurse know if needs arise.

## 2020-04-29 NOTE — ED Notes (Signed)
Pt transported to XR with Psychologist, counselling via wheelchair
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
## 2020-04-29 NOTE — ED Notes (Signed)
Pt to restroom to urinate, has urine cup in had, pt does not provide urine into cup and leaves cup in room

## 2020-04-30 ENCOUNTER — Inpatient Hospital Stay: Admission: AD | Admit: 2020-04-30 | Payer: Medicaid Other | Source: Intra-hospital | Admitting: Psychiatry

## 2020-04-30 MED ORDER — TRAZODONE HCL 100 MG PO TABS: 100.0000 mg | ORAL_TABLET | Freq: Every evening | ORAL | Status: DC | PRN

## 2020-04-30 MED ORDER — ARIPIPRAZOLE ER 400 MG IM SRER
400.0000 mg | INTRAMUSCULAR | Status: DC
  Filled 2020-04-30: qty 2

## 2020-04-30 MED ORDER — DIVALPROEX SODIUM ER 500 MG PO TB24
1000.0000 mg | ORAL_TABLET | Freq: Every day | ORAL | Status: DC
  Filled 2020-04-30 (×2): qty 2

## 2020-04-30 MED ORDER — TRIHEXYPHENIDYL HCL 2 MG PO TABS
2.0000 mg | ORAL_TABLET | Freq: Two times a day (BID) | ORAL | Status: DC
  Filled 2020-04-30 (×3): qty 1

## 2020-04-30 MED ORDER — ARIPIPRAZOLE 5 MG PO TABS
5.0000 mg | ORAL_TABLET | Freq: Every day | ORAL | Status: DC
  Filled 2020-04-30: qty 1

## 2020-04-30 NOTE — ED Notes (Addendum)
He has already removed the c collar  Pt states  "I ain't wearing that - I do not need that"

## 2020-04-30 NOTE — ED Notes (Signed)
IVC, pend BMU admit 

## 2020-04-30 NOTE — ED Provider Notes (Signed)
Emergency Medicine Observation Re-evaluation Note  Hulbert Human is a 34 y.o. male, seen on rounds today.  Pt initially presented to the ED for complaints of Medical Clearance Currently, the patient is, no complaints this morning.  Physical Exam  BP 134/82 (BP Location: Left Arm)   Pulse 76   Temp 98.2 F (36.8 C) (Oral)   Resp 20   Ht 5\' 11"  (1.803 m)   Wt 88.5 kg   SpO2 99%   BMI 27.21 kg/m  Physical Exam General: No apparent distress HEENT: moist mucous membranes CV: RRR Pulm: Normal WOB GI: soft and non tender MSK: no edema or cyanosis Neuro: face symmetric, moving all extremities    ED Course / MDM   I have reviewed the labs performed to date as well as medications administered while in observation.  No acute changes overnight or new labs this morning  Plan  Current plan is for psychiatric placement.    , Don Perking, MD 04/30/20 [phone]

## 2020-04-30 NOTE — ED Notes (Signed)
A member of his ACTT team came to visit with him  - wanded for safety prior to entry into quad area  Visitor stayed <1 minute

## 2020-04-30 NOTE — ED Notes (Signed)
Placed pts c collar on him and explained to him the importance of wearing the collar

## 2020-04-30 NOTE — ED Notes (Signed)
Patient was educated on the importance of wearing C-Collar. Patient refuses and states she does not need it.

## 2020-04-30 NOTE — ED Notes (Addendum)
He has been pacing in the BR for the last 15 minutes - I entered the BR and asked if he could come out due to another pt needed to use the it  He stated  "I was shitting"  Pt then walked to his room and closed the door

## 2020-04-30 NOTE — ED Notes (Signed)
Hourly rounding reveals patient in room. No complaints, stable, in no acute distress. Q15 minute rounds and monitoring via Rover and Officer to continue.   

## 2020-04-30 NOTE — ED Notes (Signed)
Patient refused medications

## 2020-04-30 NOTE — ED Notes (Signed)
Pt encouraged to put cervical collar on - he refuses

## 2020-04-30 NOTE — BH Assessment (Signed)
Referral information for Psychiatric Hospitalization faxed to;   [facility] [phone]),   . [facility] ([phone]--[phone])  . [facility] [phone]---[phone]),  . [place] [phone] or [phone])  . [facility] [phone]),   . [facility] [phone] -or- [phone]),   . [facility] [phone]),   . [facility] [phone]).

## 2020-04-30 NOTE — ED Notes (Signed)
Pt received drink and sandwich tray for snack

## 2020-04-30 NOTE — BH Assessment (Addendum)
TTS confirmed with Demetria charge RN, Pt does not meet criteria to be admitted to the unit at this time due to his current cervical collar hard order.

## 2020-04-30 NOTE — ED Notes (Signed)
IVC, pending BMU admit 

## 2020-04-30 NOTE — ED Notes (Signed)
Patient mother called and asked to give the patient COVID 19 vaccination. This Clinical research associate told the mother that the ED doesn't give covid shot per charge nurse.

## 2020-04-30 NOTE — ED Notes (Signed)
Pt encouraged to put the cervical collar on - he refused

## 2020-04-30 NOTE — ED Notes (Signed)
Patient continues to come out of his room, bangs the walls and disrupt the unit. When asked what he what he wants, he uses bizarre facial expressions and screams randomly. Officer and staff keeps redirecting the patient. MD notified.

## 2020-05-01 NOTE — ED Notes (Signed)
SHERIFF  DEPT  CALLED PT  GOING  TO  [facility]

## 2020-05-01 NOTE — ED Notes (Signed)
Pt at door making comments to an adolescent male. Pt stated to male pt "hey girl you are beautiful look at me." Pt has been told to go back in his rm.

## 2020-05-01 NOTE — BH Assessment (Signed)
Patient has been accepted to [facility].  Accepting physician is Dr. Estill Cotta.  Call report to [phone].  Representative was [employer].   ER Staff is aware of it:  Bonita Quin, ER Secretary  Dr. Katharina Caper, ER MD  Selena Batten, Patient's Nurse     Patient's legal guardian and mother Crystal Galloway [phone] have been updated as well.  Pt can be transported at around 9/10 am.
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
## 2020-05-01 NOTE — ED Notes (Signed)
Pt was offered a shower but declined. Pt in hallway dancing.

## 2020-05-01 NOTE — ED Provider Notes (Addendum)
Emergency Medicine Observation Re-evaluation Note  Jackson [name] is a 34 y.o. male, seen on rounds today.  Pt initially presented to the ED for complaints of Medical Clearance Currently, the patient is sleeping comfortably.  Physical Exam  BP 118/74 (BP Location: Left Arm)   Pulse 73   Temp 99.1 F (37.3 C) (Oral)   Resp 18   Ht 5\' 11"  (1.803 m)   Wt 88.5 kg   SpO2 100%   BMI 27.21 kg/m  Physical Exam General: Sleeping, comfortable appearing. Cardiac: Good peripheral perfusion. Lungs: Normal respiratory effort. Psych: Calm and cooperative.  ED Course / MDM   I have reviewed the labs performed to date as well as medications administered while in observation.  There have been no recent events in the last 24 hours.  The patient remains noncompliant with the cervical collar.  Plan  Current plan is for psychiatric placement. Patient is under full IVC at this time.     , MD 05/01/20 [phone]

## 2020-05-01 NOTE — ED Notes (Signed)
Pt given breakfast tray

## 2020-08-11 ENCOUNTER — Other Ambulatory Visit: Payer: Self-pay

## 2020-08-11 ENCOUNTER — Emergency Department
Admission: EM | Admit: 2020-08-11 | Discharge: 2020-09-17 | Disposition: A | Discharge status: Home / Self Care | Payer: No Typology Code available for payment source | Attending: Emergency Medicine | Admitting: Emergency Medicine

## 2020-08-11 DIAGNOSIS — F17210 Nicotine dependence, cigarettes, uncomplicated: Secondary | ICD-10-CM | POA: Diagnosis not present

## 2020-08-11 DIAGNOSIS — F29 Unspecified psychosis not due to a substance or known physiological condition: Secondary | ICD-10-CM

## 2020-08-11 DIAGNOSIS — F250 Schizoaffective disorder, bipolar type: Secondary | ICD-10-CM | POA: Diagnosis not present

## 2020-08-11 DIAGNOSIS — R258 Other abnormal involuntary movements: Secondary | ICD-10-CM | POA: Diagnosis present

## 2020-08-11 DIAGNOSIS — Z20822 Contact with and (suspected) exposure to covid-19: Secondary | ICD-10-CM | POA: Diagnosis not present

## 2020-08-11 LAB — URINE DRUG SCREEN, QUALITATIVE (ARMC ONLY)
Amphetamines, Ur Screen: NOT DETECTED
Barbiturates, Ur Screen: NOT DETECTED
Benzodiazepine, Ur Scrn: NOT DETECTED
Cannabinoid 50 Ng, Ur ~~LOC~~: POSITIVE — AB
Cocaine Metabolite,Ur ~~LOC~~: NOT DETECTED
MDMA (Ecstasy)Ur Screen: NOT DETECTED
Methadone Scn, Ur: NOT DETECTED
Opiate, Ur Screen: NOT DETECTED
Phencyclidine (PCP) Ur S: NOT DETECTED
Tricyclic, Ur Screen: NOT DETECTED

## 2020-08-11 LAB — SALICYLATE LEVEL: Salicylate Lvl: <7 mg/dL — ABNORMAL LOW (ref 7.0–30.0)

## 2020-08-11 LAB — ACETAMINOPHEN LEVEL: Acetaminophen (Tylenol), Serum: <10 ug/mL — ABNORMAL LOW (ref 10–30)

## 2020-08-11 LAB — CBC
HCT: 44.2 % (ref 39.0–52.0)
Hemoglobin: 14.5 g/dL (ref 13.0–17.0)
MCH: 31.3 pg (ref 26.0–34.0)
MCHC: 32.8 g/dL (ref 30.0–36.0)
MCV: 95.5 fL (ref 80.0–100.0)
Platelets: 308 10*3/uL (ref 150–400)
RBC: 4.63 MIL/uL (ref 4.22–5.81)
RDW: 13.2 % (ref 11.5–15.5)
WBC: 8.8 10*3/uL (ref 4.0–10.5)
nRBC: 0 % (ref 0.0–0.2)

## 2020-08-11 LAB — COMPREHENSIVE METABOLIC PANEL
ALT: 22 U/L (ref 0–44)
AST: 39 U/L (ref 15–41)
Albumin: 4.4 g/dL (ref 3.5–5.0)
Alkaline Phosphatase: 56 U/L (ref 38–126)
Anion gap: 12 (ref 5–15)
BUN: 21 mg/dL — ABNORMAL HIGH (ref 6–20)
CO2: 23 mmol/L (ref 22–32)
Calcium: 9.7 mg/dL (ref 8.9–10.3)
Chloride: 105 mmol/L (ref 98–111)
Creatinine, Ser: 1.38 mg/dL — ABNORMAL HIGH (ref 0.61–1.24)
GFR, Estimated: >60 mL/min (ref >60)
Glucose, Bld: 82 mg/dL (ref 70–99)
Potassium: 4.4 mmol/L (ref 3.5–5.1)
Sodium: 140 mmol/L (ref 135–145)
Total Bilirubin: 1.5 mg/dL — ABNORMAL HIGH (ref 0.3–1.2)
Total Protein: 7.2 g/dL (ref 6.5–8.1)

## 2020-08-11 LAB — ETHANOL: Alcohol, Ethyl (B): <10 mg/dL (ref <10)

## 2020-08-11 MED ORDER — DIPHENHYDRAMINE HCL 50 MG/ML IJ SOLN
50.0000 mg | Freq: Once | INTRAMUSCULAR | Status: DC
  Filled 2020-08-11: qty 1

## 2020-08-11 MED ORDER — ZIPRASIDONE MESYLATE 20 MG IM SOLR
20.0000 mg | Freq: Once | INTRAMUSCULAR | Status: AC
  Administered 2020-08-11: 20 mg via INTRAMUSCULAR
  Filled 2020-08-11: qty 20

## 2020-08-11 MED ORDER — LORAZEPAM 2 MG/ML IJ SOLN
2.0000 mg | Freq: Once | INTRAMUSCULAR | Status: AC
  Administered 2020-08-12: 2 mg via INTRAVENOUS
  Filled 2020-08-11: qty 1
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
## 2020-08-11 NOTE — ED Notes (Signed)
Pt compliant with IM medication.
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
## 2020-08-11 NOTE — BH Assessment (Signed)
Patient to be reviewed and staffed with BMU tomorrow 08/12/20. If no appropriate beds available patient to be referred to additional facilities

## 2020-08-11 NOTE — BH Assessment (Addendum)
[facility] BMU reports no appropriate beds available  Referral information for Psychiatric Hospitalization faxed to;    [facility] [phone]) [facility] reports Covid outbreak on adult unit, no beds available tonight until unit can be cleared by Infection Control  . [facility] [phone]), Kenard Gower reports Denied due to financial  . Berton Lan [phone], [phone], [phone] or [phone]), No staff available until morning  . Barry [phone]), Spero Geralds reports patient is not technically denied but is not likely to be admitted.  [facility]vet[facility] Coder [phone] -[phone]), No adult beds currently until Monday  . Turner Daniels [phone]).No answer  . [facility] [phone]) Currently at capacity
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
## 2020-08-11 NOTE — ED Notes (Addendum)
Pt can be heard form nurses station yelling/ screaming, unable to hold a conversation.  Pt coming into hallway stating he wants to leave.     Pt given sandwich tray and juice.

## 2020-08-11 NOTE — BH Assessment (Addendum)
Comprehensive Clinical Assessment (CCA) Note  08/11/2020 Jackson [name] [id number]  Chief Complaint: Patient is a 35 year old male presenting to [facility] ED under IVC. Per triage note Pt comes bpd after trying to break into someone's house. Pt thinks he is [name], thinks "we are family", and doesn't remember doing anything in the house. Pt hasn't slept in multiple days. Before arriving to patient's room patient could be heard talking to himself and responding to internal stimuli. During assessment patient appears alert and oriented x2, patient is unaware of the situation or time. Patient's speech is rapid, pressured, his thought process is disorganized and tangential. When asked what happened today, patient's answer was a flight of ideas that included "I was walking down the street, then the next street, I went around the corner, the police got me." Patient's mood would often go back and forth from euphoric to sad, he reports feeling neglected by people. Patient does report having a ACT team and reports that he hasn't been on medications "in 2 years." Patient denies SI/HI/AH/VH but is currently responding to internal stimuli.  Collateral information was obtained from patient's legal guardian and mother Aggie Cosier Padmore [phone]) who reports "we just called the ACT team yesterday because we felt like he was decompensating, he was upset that we came to his house on  Christmas and over the last 2-3 weeks he has been hallucinating." "He's been off his medications since being released from [facility] in November." Patient is still currently active with his ACT team-[facility] [phone] (Crisis line)  Per Psyc NP Lerry Liner patient is recommended for Inpatient Hospitalization  Chief Complaint  Patient presents with  . Psychiatric Evaluation   Visit Diagnosis: Schizoaffective  Disorder   CCA Screening, Triage and Referral (STR)  Patient Reported Information How did you hear about [place]?  Other (Comment)  Referral name: No data recorded Referral phone number: No data recorded  Whom do you see for routine medical problems? Other (Comment)  Practice/Facility Name: No data recorded Practice/Facility Phone Number: No data recorded Name of Contact: No data recorded Contact Number: No data recorded Contact Fax Number: No data recorded Prescriber Name: No data recorded Prescriber Address (if known): No data recorded  What Is the Reason for Your Visit/Call Today? Patient was found breaking into homes  How Long Has This Been Causing You Problems? > than 6 months  What Do You Feel Would Help You the Most Today? Assessment Only; Therapy; Medication   Have You Recently Been in Any Inpatient Treatment (Hospital/Detox/Crisis Center/28-Day Program)? Yes  Name/Location of Program/Hospital:Unknown  How Long Were You There? No data recorded When Were You Discharged? No data recorded  Have You Ever Received Services From [facility] Before? Yes  Who Do You See at [facility]? Inpatient treatment   Have You Recently Had Any Thoughts About Hurting Yourself? No  Are You Planning to Commit Suicide/Harm Yourself At This time? No   Have you Recently Had Thoughts About Hurting Someone Karolee Ohs? No  Explanation: No data recorded  Have You Used Any Alcohol or Drugs in the Past 24 Hours? No  How Long Ago Did You Use Drugs or Alcohol? No data recorded What Did You Use and How Much? No data recorded  Do You Currently Have a Therapist/Psychiatrist? Yes  Name of Therapist/Psychiatrist: ACT Team   Have You Been Recently Discharged From Any Office Practice or Programs? No  Explanation of Discharge From Practice/Program: No data recorded    CCA Screening Triage Referral Assessment  Type of Contact: Face-to-Face  Is this Initial or Reassessment? No data recorded Date Telepsych consult ordered in [facility]:  04/11/2020  Time Telepsych consult ordered in [facility]:  1444   Patient Reported  Information Reviewed? Yes  Patient Left Without Being Seen? No data recorded Reason for Not Completing Assessment: No data recorded  Collateral Involvement: Crystal Kil-Legal Guardian   Does Patient Have a Automotive engineer Guardian? No data recorded Name and Contact of Legal Guardian: Crystal Cooprider [phone]  If Minor and Not Living with Parent(s), Who has Custody? n/a  Is CPS involved or ever been involved? Never  Is APS involved or ever been involved? Never   Patient Determined To Be At Risk for Harm To Self or Others Based on Review of Patient Reported Information or Presenting Complaint? No  Method: No data recorded Availability of Means: No data recorded Intent: No data recorded Notification Required: No data recorded Additional Information for Danger to Others Potential: No data recorded Additional Comments for Danger to Others Potential: No data recorded Are There Guns or Other Weapons in Your Home? No data recorded Types of Guns/Weapons: No data recorded Are These Weapons Safely Secured?                            No data recorded Who Could Verify You Are Able To Have These Secured: No data recorded Do You Have any Outstanding Charges, Pending Court Dates, Parole/Probation? No data recorded Contacted To Inform of Risk of Harm To Self or Others: No data recorded  Location of Assessment: [facility] ED   Does Patient Present under Involuntary Commitment? Yes  IVC Papers Initial File Date: 08/11/2020   [place] of Residence: [place]   Patient Currently Receiving the Following Services: ACTT Psychologist, educational)   Determination of Need: Emergent (2 hours)   Options For Referral: No data recorded    CCA Biopsychosocial Intake/Chief Complaint:  Patient was caught breaking into someone's home and having delusions that he is [employer]  Current Symptoms/Problems: Patient was caught breaking into someone's home and having delusions that he is  Geologist, engineering   Patient Reported Schizophrenia/Schizoaffective Diagnosis in Past: Yes   Strengths: UTA  Preferences: UTA  Abilities: UTA   Type of Services Patient Feels are Needed: UTA   Initial Clinical Notes/Concerns: None   Mental Health Symptoms Depression:  None   Duration of Depressive symptoms: No data recorded  Mania:  Increased Energy; Irritability; Racing thoughts; Recklessness   Anxiety:   Difficulty concentrating; Irritability   Psychosis:  Delusions; Grossly disorganized speech; Hallucinations   Duration of Psychotic symptoms: Greater than six months   Trauma:  None   Obsessions:  Poor insight   Compulsions:  Absent insight/delusional; Poor Insight   Inattention:  Disorganized   Hyperactivity/Impulsivity:  Blurts out answers   Oppositional/Defiant Behaviors:  None   Emotional Irregularity:  None   Other Mood/Personality Symptoms:  No data recorded   Mental Status Exam Appearance and self-care  Stature:  Average   Weight:  Average weight   Clothing:  Disheveled   Grooming:  Normal   Cosmetic use:  None   Posture/gait:  Normal   Motor activity:  Agitated   Sensorium  Attention:  Inattentive   Concentration:  Preoccupied; Scattered   Orientation:  Person; Place   Recall/memory:  Defective in Immediate   Affect and Mood  Affect:  Anxious; Blunted; Labile   Mood:  Anxious; Euphoric; Hypomania  Relating  Eye contact:  Fleeting   Facial expression:  Anxious   Attitude toward examiner:  Cooperative   Thought and Language  Speech flow: Flight of Ideas; Pressured   Thought content:  Delusions   Preoccupation:  No data recorded  Hallucinations:  Auditory; Visual (Patient denies, but has a history of AH and VH)   Organization:  No data recorded  [employer] of Knowledge:  Fair   Intelligence:  Average   Abstraction:  Abstract   Judgement:  Poor   Reality Testing:  Distorted   Insight:  Poor    Decision Making:  Impulsive   Social Functioning  Social Maturity:  Impulsive   Social Judgement:  Impropriety   Stress  Stressors:  Other (Comment)   Coping Ability:  Normal   Skill Deficits:  None   Supports:  Family     Religion: Religion/Spirituality Are You A Religious Person?: No  Leisure/Recreation: Leisure / Recreation Do You Have Hobbies?: No  Exercise/Diet: Exercise/Diet Do You Exercise?: No Have You Gained or Lost A Significant Amount of Weight in the Past Six Months?: No Do You Follow a Special Diet?: No Do You Have Any Trouble Sleeping?: No   CCA Employment/Education Employment/Work Situation: Employment / Work Situation Employment situation: On disability Why is patient on disability: Mental Health reasons How long has patient been on disability: Unknown Patient's job has been impacted by current illness:  (No current employment) What is the longest time patient has a held a job?: Unknown Where was the patient employed at that time?: Unknow Has patient ever been in the [employer]?: No  Education: Education Is Patient Currently Attending School?: No Did Garment/textile technologist From [employer]?:  (Unknown) Did You Product manager?:  (Unknown) Did You Attend Graduate School?:  (Unknown) Did You Have An Individualized Education Program (IIEP): No Did You Have Any Difficulty At School?: No Patient's Education Has Been Impacted by Current Illness: No   CCA Family/Childhood History Family and Relationship History: Family history Marital status: Single Are you sexually active?: No Does patient have children?: No  Childhood History:  Childhood History By whom was/is the patient raised?: Both parents Additional childhood history information: UTA Description of patient's relationship with caregiver when they were a child: UTA Patient's description of current relationship with people who raised him/her: UTA How were you disciplined when you got in trouble  as a child/adolescent?: UTA Does patient have siblings?:  (Unknown) Did patient suffer any verbal/emotional/physical/sexual abuse as a child?:  (Unknown) Did patient suffer from severe childhood neglect?:  (Unknown) Has patient ever been sexually abused/assaulted/raped as an adolescent or adult?:  (Unknown) Was the patient ever a victim of a crime or a disaster?:  (Unknown) Witnessed domestic violence?:  (Unknown) Has patient been affected by domestic violence as an adult?:  (Unknown)  Child/Adolescent Assessment:     CCA Substance Use Alcohol/Drug Use: Alcohol / Drug Use Pain Medications: See MAR Prescriptions: See MAR Over the Counter: See MAR History of alcohol / drug use?: No history of alcohol / drug abuse                         ASAM's:  Six Dimensions of Multidimensional Assessment  Dimension 1:  Acute Intoxication and/or Withdrawal Potential:      Dimension 2:  Biomedical Conditions and Complications:      Dimension 3:  Emotional, Behavioral, or Cognitive Conditions and Complications:     Dimension 4:  Readiness  to Change:     Dimension 5:  Relapse, Continued use, or Continued Problem Potential:     Dimension 6:  Recovery/Living Environment:     ASAM Severity Score:    ASAM Recommended Level of Treatment:     Substance use Disorder (SUD)    Recommendations for Services/Supports/Treatments:   Per Psyc NP Rashaun Dixon patient is recommended for Inpatient Hospitalization   DSM5 Diagnoses: Patient Active Problem List   Diagnosis Date Noted  . Noncompliance 10/18/2018  . Schizoaffective disorder, bipolar type (HCC) 10/17/2018    Patient Centered Plan: Patient is on the following Treatment Plan(s):  Schizoaffective disorder   Referrals to Alternative Service(s): Referred to Alternative Service(s):   Place:   Date:   Time:    Referred to Alternative Service(s):   Place:   Date:   Time:    Referred to Alternative Service(s):   Place:   Date:   Time:     Referred to Alternative Service(s):   Place:   Date:   Time:     Arrian Manson A Shaneka Efaw, LCAS-A

## 2020-08-12 LAB — RESP PANEL BY RT-PCR (FLU A&B, COVID) ARPGX2
Influenza A by PCR: NEGATIVE
Influenza B by PCR: NEGATIVE
SARS Coronavirus 2 by RT PCR: NEGATIVE

## 2020-08-12 MED ORDER — ZIPRASIDONE MESYLATE 20 MG IM SOLR
20.0000 mg | Freq: Once | INTRAMUSCULAR | Status: AC
  Administered 2020-08-12: 20 mg via INTRAMUSCULAR

## 2020-08-12 NOTE — ED Notes (Signed)
Pt went in the bathroom and stayed a long time, when staff went to check on him he stated he was "shitting". Pt took a long time in the bathroom and when security came to assist , pt observed sitting on toilet using it. Pt with outbursts while in the bathroom.

## 2020-08-12 NOTE — ED Notes (Signed)
Patient in bathroom unable to get vitals at this time.

## 2020-08-12 NOTE — ED Notes (Signed)
Pt having occasional outbursts in his room, when staff goes there he keeps quiet. Will continue to monitor.

## 2020-08-12 NOTE — ED Notes (Signed)
Meal tray given 

## 2020-08-12 NOTE — ED Notes (Signed)
Patient was given IM medications by this Clinical research associate and Gena Fray. Security assisted with emotional support. Patient was assisted to bed by security and medications administered while patient was sitting.

## 2020-08-12 NOTE — ED Notes (Addendum)
Pt loudly yelling and shrieking; pt wants meal tray, none available, pt doesn't believe me and insists, offered PB and graham with apple sauce and accepted;   Pt adamant about not taking meds, pt is pressured speech and wandering topics, responding to own stimuli, unable to reason

## 2020-08-12 NOTE — ED Notes (Signed)
Pt given ice water and woken by room 22

## 2020-08-12 NOTE — ED Notes (Signed)
Patient is IVC pending inpatient admit 

## 2020-08-12 NOTE — ED Notes (Signed)
Pt loud and uncooperative, EDP asked for more meds  Unable to reorient pt  2 bags to locker room

## 2020-08-12 NOTE — BH Assessment (Addendum)
Referral Check:   [facility] ([phone]) [facility] reports Covid outbreak on adult unit, no beds available tonight until unit can be cleared by Infection Control   [facility] [phone]), Kenard Gower reports Denied due to financial   [facility] Lan [phone], [phone], [phone] or [phone]), Pati Gallo reports to call back after 11am to speak to [facility] [phone]), Asher Muir reports no beds available for today and referrals will be reviewed tomorrow    [facility] [phone] -[phone]), No adult beds currently until Monday   [facility] ([phone]) Left voicemail     [facility] [phone]) Currently at capacity   Update: TTS made contact with [facility] at 3pm. TTS left a call back note with Marylu Lund for [place] due to unsuccessful transfers.

## 2020-08-12 NOTE — ED Provider Notes (Signed)
Emergency Medicine Observation Re-evaluation Note  Jackson [name] is a 35 y.o. male, seen on rounds today.  Pt initially presented to the ED for complaints of Psychiatric Evaluation Currently, the patient is resting, voices no medical complaints.  Physical Exam  BP 121/83   Pulse 85   Temp 98.2 F (36.8 C) (Oral)   Resp 18   Ht 5\' 11"  (1.803 m)   Wt 88 kg   SpO2 99%   BMI 27.06 kg/m  Physical Exam General: Resting in no acute distress Cardiac: No cyanosis Lungs: Equal rise and fall Psych: Not agitated  ED Course / MDM  EKG:    I have reviewed the labs performed to date as well as medications administered while in observation.  Recent changes in the last 24 hours include no events overnight.  Plan  Current plan is for psychiatric disposition. Patient is under full IVC at this time.   , MD 08/12/20 [phone]

## 2020-08-12 NOTE — ED Notes (Signed)

## 2020-08-12 NOTE — ED Notes (Signed)
Patient in restroom and unwilling to come out. Patient screaming out and beating on walls. EDP Scotty Court made aware. EDP gave orders to give ativan 2mg  injection and 20mg  geodon IM.

## 2020-08-12 NOTE — ED Notes (Signed)
Unable to obtain vitals due to patient sleeping. Will continue to monitor.   

## 2020-08-13 DIAGNOSIS — F250 Schizoaffective disorder, bipolar type: Secondary | ICD-10-CM

## 2020-08-13 MED ORDER — HALOPERIDOL LACTATE 5 MG/ML IJ SOLN
5.0000 mg | Freq: Four times a day (QID) | INTRAMUSCULAR | Status: DC | PRN
  Administered 2020-08-14 – 2020-08-30 (×7): 5 mg via INTRAMUSCULAR
  Filled 2020-08-13 (×8): qty 1

## 2020-08-13 MED ORDER — DIVALPROEX SODIUM 500 MG PO DR TAB
1500.0000 mg | DELAYED_RELEASE_TABLET | ORAL | Status: AC
  Filled 2020-08-13: qty 3

## 2020-08-13 MED ORDER — DROPERIDOL 2.5 MG/ML IJ SOLN
10.0000 mg | Freq: Once | INTRAMUSCULAR | Status: AC
  Administered 2020-08-13: 10 mg via INTRAMUSCULAR
  Filled 2020-08-13: qty 4

## 2020-08-13 MED ORDER — LORAZEPAM 2 MG/ML IJ SOLN
INTRAMUSCULAR | Status: AC
  Administered 2020-08-13: 2 mg via INTRAVENOUS
  Filled 2020-08-13: qty 2

## 2020-08-13 MED ORDER — LORAZEPAM 2 MG/ML IJ SOLN
2.0000 mg | INTRAMUSCULAR | Status: DC | PRN
  Administered 2020-08-14 – 2020-08-30 (×7): 2 mg via INTRAMUSCULAR
  Filled 2020-08-13 (×8): qty 1

## 2020-08-13 MED ORDER — LORAZEPAM 2 MG/ML IJ SOLN: 2.0000 mg | Freq: Once | INTRAMUSCULAR | Status: AC

## 2020-08-13 MED ORDER — LORAZEPAM 2 MG PO TABS
2.0000 mg | ORAL_TABLET | ORAL | Status: DC | PRN
  Administered 2020-08-15 – 2020-09-16 (×27): 2 mg via ORAL
  Filled 2020-08-13 (×29): qty 1

## 2020-08-13 MED ORDER — OLANZAPINE 10 MG PO TBDP
30.0000 mg | ORAL_TABLET | Freq: Every day | ORAL | Status: DC
  Administered 2020-08-13 – 2020-08-18 (×4): 30 mg via ORAL
  Filled 2020-08-13 (×4): qty 3
  Filled 2020-08-13 (×2): qty 6
  Filled 2020-08-13 (×10): qty 3

## 2020-08-13 MED ORDER — DIVALPROEX SODIUM ER 500 MG PO TB24
1500.0000 mg | ORAL_TABLET | Freq: Every day | ORAL | Status: DC
Start: 2020-08-13 — End: 2020-08-15
  Filled 2020-08-13 (×4): qty 3

## 2020-08-13 NOTE — ED Notes (Signed)
Hourly rounding reveals patient in room. No complaints, stable, in no acute distress. Q15 minute rounds and monitoring via Security Cameras to continue. 

## 2020-08-13 NOTE — ED Notes (Signed)
Patient uncooperative and unable to obtain vitals at this time.
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
## 2020-08-13 NOTE — BH Assessment (Incomplete)
Referral Check:   [facility] [phone]), Kelly,Pt under remains under review however they have 6 units shut down and multiple staff on leave due to covid. Tresa Endo reports that there is a possibility for acceptance however not in the immediate future.   Turner Daniels [phone] requesting a call back.

## 2020-08-13 NOTE — BH Assessment (Signed)
Referral Check:   [facility]([phone]) [name] reports Covid outbreak on adult unit, no beds available until unit can be cleared by Infection Control   Jackson [name] [phone]),Drew reportsDenied due to financial   Jackson [name] [phone], [phone], [phone] or [phone]),Jackson [name] provided Jackson [name] (intake RN, [phone]) TTS attempted to make contact received voicemail directing to make contact with 3pm RN at [phone]). Jackson [name] reports no bed availability for the next few days due to staffing shortage.    Jackson [name] [phone] answer   [name] [phone] -or- [phone]), Jackson [name] reports Denied due to long 2 year admission at [facility] [phone] 3rd voicemail   [name]([phone])Currently at capacity
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
## 2020-08-13 NOTE — ED Notes (Addendum)
Jackson [name] (mother and legal guardian) called to get an update on the patient.  RN explained the patient had an outburst and required medication and restraints.  Ms. Zwart was very understanding.  This Clinical research associate gave Ms, Mccance the contact number for BHU if she wanted to call back for another update.

## 2020-08-13 NOTE — ED Notes (Signed)
Dr .Toni Amend at bedside to assess patient.  Patient compliant with PO medications.

## 2020-08-13 NOTE — ED Notes (Signed)
Pt sleeping after receiving IM and PO medication.  Will attempt to get VS once awake.
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
## 2020-08-13 NOTE — BH Assessment (Signed)
Referral Check:   [phone]) [name] reports Covid outbreak on adult unit, no beds available until unit can be cleared by Infection Control   Jackson [name] [phone]),Jackson [name] reportsDenied due to financial   Jackson [name] [phone], [phone], [phone] or [phone]),Jackson [name] reports to call back after 11am to speak to Jackson [name]. Night TTS followed up but no staff available until tomorrow morning 08/13/20   [facility] ([phone]),Per Previous: Jackson [name] reports no beds available for today and referrals will be reviewed tomorrow.     [name] [phone] -or- [phone]), Jackson [name] reports Denied due to long 2 year admission at [facility] [phone]) Left 2nd voicemail at 2:43am    [name])Currently at capacity   [facility] to check on referrals, staff reports that referrals are no longer in the fax que, TTS to re-fax referral, task completed at 3:10am

## 2020-08-13 NOTE — ED Notes (Signed)
Pt in 4 point restraints. 1:1 sitter at bedside.   

## 2020-08-13 NOTE — ED Notes (Addendum)
Pt in 4 point restraints. 1:1 sitter at bedside.

## 2020-08-13 NOTE — ED Notes (Signed)
Patient up to restroom.

## 2020-08-13 NOTE — ED Notes (Signed)
Report to include Situation, Background, Assessment, and Recommendations received from Amy RN. Patient alert and oriented, warm and dry, in no acute distress. Patient denies SI, HI, AVH and pain. Patient made aware of Q15 minute rounds and security cameras for their safety. Patient instructed to come to me with needs or concerns.  

## 2020-08-13 NOTE — ED Notes (Signed)
Pt is 4 point restraints.  1:1 sitter at bedside.

## 2020-08-13 NOTE — ED Provider Notes (Signed)
Emergency Medicine Observation Re-evaluation Note  Jackson [name] is a 35 y.o. male, seen on rounds today.  Pt initially presented to the ED for complaints of Psychiatric Evaluation Currently, the patient is resting.  Physical Exam  BP 119/81   Pulse 71   Temp 98.2 F (36.8 C) (Oral)   Resp 18   Ht 5\' 11"  (1.803 m)   Wt 88 kg   SpO2 100%   BMI 27.06 kg/m  Physical Exam Constitutional:      Appearance: He is not ill-appearing or toxic-appearing.  HENT:     Head: Atraumatic.  Eyes:     Extraocular Movements: Extraocular movements intact.     Pupils: Pupils are equal, round, and reactive to light.  Pulmonary:     Effort: Pulmonary effort is normal.  Abdominal:     General: There is no distension.  Musculoskeletal:        General: No deformity.  Skin:    General: Skin is warm and dry.  Neurological:     General: No focal deficit present.     Cranial Nerves: No cranial nerve deficit.      ED Course / MDM  EKG:    I have reviewed the labs performed to date as well as medications administered while in observation.  Recent changes in the last 24 hours include none.  Plan  Current plan is for inpatient psychiatric care. Patient is under full IVC at this time.   , MD 08/13/20 [phone]

## 2020-08-13 NOTE — ED Notes (Signed)
Snack and beverage given. 

## 2020-08-13 NOTE — ED Notes (Signed)
IVC/  PENDING  PLACEMENT 
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
## 2020-08-13 NOTE — ED Notes (Signed)
Unable to obtain vitals due to patient sleeping. Will continue to monitor.   

## 2020-08-14 MED ORDER — DIPHENHYDRAMINE HCL 50 MG/ML IJ SOLN
50.0000 mg | Freq: Once | INTRAMUSCULAR | Status: AC
  Administered 2020-08-14: 50 mg via INTRAMUSCULAR

## 2020-08-14 NOTE — ED Notes (Signed)
Pt refuses to answer suicide assessment questions. Asking for something to eat and went back to his room.

## 2020-08-14 NOTE — ED Notes (Signed)
Patient put in restrain with the help of [place] police officers and staff. This Clinical research associate and oncoming nurse medicated the patient. Patient tolerated well.

## 2020-08-14 NOTE — ED Notes (Signed)
Hourly rounding reveals patient in room. No complaints, stable, in no acute distress. Q15 minute rounds and monitoring via Security Cameras to continue. 

## 2020-08-14 NOTE — BH Assessment (Signed)
Selena Batten of [facility] [phone]) confirmed that pt's proof of address had been received but reports that her supervisor would have to review it tomorrow. Follow up needed.

## 2020-08-14 NOTE — BH Assessment (Signed)
TTS followed up with [facility] ([phone]) to confirm whether pt's referral packet had been received. Robert of [facility] confirmed that packet was received, however he explained that before proceeding they would need a proof of address for pt due to his demographic information reflecting a [facility] address. This Clinical research associate contacted legal guardian to request a document with pt's proof of address. Guardian agreed to send the requested documents.

## 2020-08-14 NOTE — ED Notes (Addendum)
Hourly rounding completed at this time, patient currently asleep in room. No complaints, stable, and in no acute distress. Q15 minute rounds and monitoring via Security Cameras to continue. 

## 2020-08-14 NOTE — ED Notes (Signed)
Patient became verbally aggressive towards staff because he wants to leave the hospital. He started banging his head on the window where the officer sitting. Patient unable to redirect by staff. He is waking up other patients and disturbing the unit. EDP and charge nurse notified.

## 2020-08-14 NOTE — BH Assessment (Signed)
TTS made contact with [facility] ([phone]) to confirm receipt of referral faxed earlier today. Robert request for referral to be re-faxed due to not receiving it to [phone] and [phone]. Task completed at 6:25pm.
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
## 2020-08-14 NOTE — ED Notes (Signed)
Pt much calmer and out of restraints. Was demanding to use the phone and was informed he cannot use the phone due to earlier behavior. Pt labile at times cursing out staff and then immediately starts crying then shouts at staff again. Staff talked to pt and was clear of criteria for release and pt calmed down. Pt on bed, will continue to monitor and document any changes.

## 2020-08-14 NOTE — ED Notes (Signed)
Report received from [facility] including Situation, Background, Assessment, and Recommendations. Patient alert and oriented, warm and dry, and in no acute distress. Pt is speaking on phone at this time. Patient made aware of Q15 minute rounds and Psychologist, counselling presence for their safety. Patient instructed to come to this nurse with needs or concerns.

## 2020-08-14 NOTE — ED Notes (Signed)
Pt is in room on bed now, pt off phone. In to collect assessment on pt and pt states, "I ain't talking to you, get out of here." Pt continues to not speak to staff and show no interest in speaking. Pt also informed that the lights were to be cut and and request to collect vitals, pt states, "the hell you are." Phone is obtained now and pt remains in bed under blanket. To continue to monitor.

## 2020-08-14 NOTE — ED Notes (Signed)
This Clinical research associate called mother Hydrographic surveyor Dolph) to report that the patient is on restraint. Patient mother said that she is worried about her son and that she wants Dr.[name] and staff to know that she doesn't want the patient to go to [place]. She said the last time he was at [facility] he was able to get to where he can live by himself in an apartment. And that's what she is hoping to happen this time as well.

## 2020-08-14 NOTE — ED Provider Notes (Signed)
Emergency Medicine Observation Re-evaluation Note  Jackson [name] is a 35 y.o. male, seen on rounds today.  Pt initially presented to the ED for complaints of Psychiatric Evaluation Currently, the patient is agitated, yelling and threatening staff and punching walls in BHU.  Physical Exam  BP 97/77 (BP Location: Right Arm)   Pulse (!) 54   Temp 97.6 F (36.4 C) (Oral)   Resp 18   Ht 5\' 11"  (1.803 m)   Wt 88 kg   SpO2 99%   BMI 27.06 kg/m  Physical Exam General: alert, agitated, yelling and threatening staff about leaving.  Cardiac: slightly bradycardic on VS assessment Lungs: equal chest rise, no acute respiratory distress.  Psych: agitated, tangential, punching walls and not verbally able to de-escalate at this time.   ED Course / MDM  EKG:    I have reviewed the labs performed to date as well as medications administered while in observation.  Recent changes in the last 24 hours include punching walls and threatening staff.Plan is to give additional sedation medication and restrain after initial trial of 10mg  haldol with little/no effect.   Plan  Current plan is for inpatient psych admission. Patient is under full IVC at this time.   , MD 08/14/20 [phone]

## 2020-08-14 NOTE — ED Notes (Signed)
Pt mother calls this nurse, states that her son just called her and that she is concerned. Mother, who is legal guardian, states pt informed her that he was going to kill himself and she is not wanting this to happen. She is informed that pt is under 24/6 surveillance and is in a safe, secured area and that we would take good care of pt. Pt mother is concerned that the pts history is unknown and that his care will not be adequate for his situation. She is informed that this is not the case and staff/psych team is well aware of history and plan of care in place. Mother appreciates conversation, to continue to monitor pt.

## 2020-08-14 NOTE — ED Notes (Signed)
Hourly rounding completed at this time, patient currently asleep in room. No complaints, stable, and in no acute distress. Q15 minute rounds and monitoring via Security Cameras to continue. 

## 2020-08-14 NOTE — ED Notes (Signed)
Pt asleep, will take vs when he wakes up

## 2020-08-14 NOTE — ED Notes (Signed)
Patient moved to room 5. He said he is cold in the other room.

## 2020-08-14 NOTE — BH Assessment (Signed)
Spoke with pt's legal guardian who advised that pt had an apartment in [place] and was living independently. Due to pt's med noncompliance and associated worsening mental health, mother reported that the pt would no longer be able to keep the apartment and would have to return to [facility].   Writer referred pt to [facility] [phone]). Spoke with Barbara Cower who reports facility is on an extended wait list. Barbara Cower advised that their wait list is currently months out.

## 2020-08-14 NOTE — ED Notes (Signed)
IVC/  PENDING  PLACEMENT 

## 2020-08-14 NOTE — ED Notes (Addendum)
Pt refuses to take his depakote, see refused MAR note. Pt is provided with snack tray and sprite now, reminded of BHU rules of only water until breakfast which pt agrees and acknowledges. Pt states that he is leaving and going home tomorrow, pt informed by this nurse that now this is not the plan but this nurse will be with him through the night and can help with care tonight, pt expresses understanding. Pt in room now, lights out, to continue to monitor.

## 2020-08-14 NOTE — BH Assessment (Signed)
TTS completed and faxed [facility] referral.   TTS is awaiting response and will follow up accordingly.

## 2020-08-14 NOTE — ED Notes (Signed)
VS not taken, Patient asleep. 

## 2020-08-15 MED ORDER — VALPROIC ACID 250 MG/5ML PO SOLN
500.0000 mg | Freq: Three times a day (TID) | ORAL | Status: DC
  Administered 2020-08-16 – 2020-08-25 (×19): 500 mg via ORAL
  Filled 2020-08-15 (×41): qty 10

## 2020-08-15 NOTE — ED Notes (Signed)
Pt has pulled pants down in day room and is demanding to call his mother at this time. States that he demands to have his privacy because he is "tired of having everyone seeing my dick." Pt is continuing to escalate and unable to calm with verbal deescalating technique. Pt has PRNs in place at this time, will admin if necessary.

## 2020-08-15 NOTE — ED Notes (Signed)
He is lying in bed with his eyes closed - VS to be obtained when he awakens and agrees to allow VS to be taken

## 2020-08-15 NOTE — BH Assessment (Signed)
TTS made contact with [facility] [phone]) and spoke to [place] who confirm the information faxed by night TTS. Current TTS completed verbal screening. Coralee North report plans to provide the completed information to her supervisor and make contact with TTS later to confirm approval or denial of services.   Due to TTS assigned ascom being currently out of commission, TTS provided secondary numbers [phone] & [phone]).

## 2020-08-15 NOTE — ED Notes (Signed)
Pt has thrown toilet paper over the camera in his room but has remained in Dayroom. Pt is cussing at staff and states he needs more toilet paper, states that he will "shit right here in the floor" if he does not receive it. Pt is becoming increasingly agitated. Security is at area to help monitor.

## 2020-08-15 NOTE — ED Notes (Signed)
Hourly rounding completed at this time, patient currently asleep in room. No complaints, stable, and in no acute distress. Q15 minute rounds and monitoring via Security Cameras to continue. 

## 2020-08-15 NOTE — ED Notes (Signed)
Pt provided with snack and drink per request now

## 2020-08-15 NOTE — ED Notes (Signed)
Hourly rounding reveals patient in room. No complaints, stable, in no acute distress. Q15 minute rounds and monitoring via Security Cameras to continue. 

## 2020-08-15 NOTE — ED Notes (Signed)
He is sleeping  I will attempt to administer meds when he awakens and obtain VS

## 2020-08-15 NOTE — ED Notes (Signed)
Hourly rounding completed at this time, patient currently awake in restroom. No complaints, stable, and in no acute distress. Q15 minute rounds and monitoring via [employer] to continue.

## 2020-08-15 NOTE — ED Notes (Signed)
He is sleeping  I will attempt to obtain VS when he awakens

## 2020-08-15 NOTE — ED Notes (Signed)
Hourly rounding completed at this time, patient currently awake in dayroom. Pt speaking with this nurse, security, and [employer] in attempt to deescalate pt. Q15 minute rounds and monitoring via [employer] to continue.

## 2020-08-15 NOTE — ED Notes (Signed)
Message sent to TTS  - received a call from [facility] - they state that the paperwork needs to be corrected if he lives in [place] then the paperwork needs to state that he lives in [place] - apparently the paperwork states that he lives in [place] and they will not be able to take him due to [place] covering the [place] area he requests a call back Amor RN [phone] he leaves today at 1600

## 2020-08-15 NOTE — ED Notes (Signed)
PD who was in [place] to [place] at 0555 to assist to calm pt so he could speak to officer, pt continues to escalate and is unable to calm and keep yelling. Pt is adamant that he has rights and is saving children. Pt requires PRN meds which he does take. Pt now angry that the bathroom door is closed and states he is going to have BM in floor despite security to go open door. Pt to other room in BHU and mimics like he is having BM in floor. Pt takes two pillow cases to room and wipes bottom. Pt pacing Dayroom and is now returning to bed and lays down now at [phone]

## 2020-08-15 NOTE — ED Notes (Signed)
Pt has laid in bed and has calmed now. Pt under blanket and appears to be asleep

## 2020-08-15 NOTE — ED Notes (Signed)
Pt had BM in room 3, cleaned up at this time. Pt is asleep.

## 2020-08-15 NOTE — ED Notes (Signed)
Pt was provided with toilet paper for restroom after he asks again, pt out of restroom now and attempts to talk paper to room. Pt asked pt this nurse to hand over toilet paper, pt states we are taking away his rights and states this nurse is being racist, "because I'm a negro and you're white you won't let me have this." Pt steps to confront this nurse and security are at sides of nurse. Pt starts to speak to security and then tosses toilet paper at officer in disgust. Pt then backs away in sense of attempting to provoke response.

## 2020-08-15 NOTE — ED Notes (Signed)
Report to include situation, background, assessment and recommendations from Amy RN. Patient sleeping, respirations regular and unlabored. Q15 minute rounds and security camera observation to continue.    

## 2020-08-15 NOTE — ED Notes (Signed)
Pt out of room, requesting the phone to make an important phone call. Pt then states that call is to save all of the children in [place]. Pt is speaking of same conversation as before where he wants to call 911. Pt when informed of phone hours, states that people like this nurse are the reason children die. Pt states that when this nurse has children, that they will be kidnapped and if they die it is because of people like this nurse. Pt unhappy with nurse and is pacing through dayroom.

## 2020-08-15 NOTE — BH Assessment (Signed)
TTS made contact with [facility] [phone]) and spoke to Amore who is requesting for pt's address to be updated to his [place] residence and refaxed in order for the referral to be completed.   TTS made contact with pt's legal guardian Hydrographic surveyor [place]wine [phone]) who confirmed pt to be a resident of [address] and provided the address ( [address] [name], [place] [address] ). TTS provided [place] an update and answered any questions. [place] was receptive of pt's current disposition and reported no current concerns.   TTS made contact with pt access Clydie Braun) who updated pt's address information.  TTS faxed updated referral to [facility] at 4pm and will follow up accordingly.

## 2020-08-15 NOTE — ED Provider Notes (Signed)
Emergency Medicine Observation Re-evaluation Note  Sarah Jafari is a 35 y.o. male, seen on rounds today.  Pt initially presented to the ED for complaints of Psychiatric Evaluation  Currently, the patient is calm, no acute complaints.  Physical Exam  Blood pressure 99/68, pulse 67, temperature 98.1 F (36.7 C), temperature source Oral, resp. rate 18, height 5\' 11"  (1.803 m), weight 88 kg, SpO2 99 %. Physical Exam General: NAD Lungs: CTAB Psych: not agitated  ED Course / MDM  EKG:    I have reviewed the labs performed to date as well as medications administered while in observation.  Recent changes in the last 24 hours include no acute events overnight.    Plan  Current plan is for continued psych evaluation and management. Patient is under full IVC at this time.   , MD 08/15/20 1141

## 2020-08-16 DIAGNOSIS — F250 Schizoaffective disorder, bipolar type: Secondary | ICD-10-CM | POA: Diagnosis not present

## 2020-08-16 MED ORDER — OLANZAPINE 10 MG IM SOLR: 30.0000 mg | Freq: Every evening | INTRAMUSCULAR | Status: DC | PRN

## 2020-08-16 MED ORDER — DIPHENHYDRAMINE HCL 50 MG/ML IJ SOLN
50.0000 mg | Freq: Four times a day (QID) | INTRAMUSCULAR | Status: DC | PRN
  Administered 2020-08-16 – 2020-08-30 (×5): 50 mg via INTRAMUSCULAR
  Filled 2020-08-16 (×6): qty 1

## 2020-08-16 MED ORDER — HALOPERIDOL LACTATE 5 MG/ML IJ SOLN: 5.0000 mg | Freq: Four times a day (QID) | INTRAMUSCULAR | Status: DC | PRN

## 2020-08-16 NOTE — ED Notes (Signed)
Patient at nursing station taking and screaming out loud perverted and inappropriate word, patient banging on window with his head. Goes to his room and comes back to window and continues to bang his head on glass window.Dr. Toni Amend aware of patient behavior and stated patient has prn meds available. Prn administers.

## 2020-08-16 NOTE — ED Notes (Signed)
Patient up early at nursing station sharing his side of his story at to why he is here in the ed. Patient reports he was sitting on the step where children were either being held or were going missing once they entered the house. Patient states someone disguised as [name] clause was luring children into this house and once they went in they never came back out. Patient associating pictures of missing children at [employer] to them being in this house, patient states no one is listening to him and he wants someone to go there and check this house out, because there they will find all the missing children. Patient was not easily re-oriented and is uninterested in feed back from staff. Patient back to his room after receiving something to eat. Safety maintained, will continue to monitor.

## 2020-08-16 NOTE — ED Notes (Signed)
Hourly rounding reveals patient in room. No complaints, stable, in no acute distress. Q15 minute rounds and monitoring via Security Cameras to continue. 

## 2020-08-16 NOTE — ED Notes (Signed)
Patient stripped his bed and cleaned his room, shower and oral hygrines supplies provided. Clean linen proved. Will continue to monitor.

## 2020-08-16 NOTE — ED Notes (Signed)
Patient phone obtained, patient on phone calling [employer], patient was successful in communicating his concerns to [facility], FBI called secretary to be made aware that patient has called the FBI. Patient asked for phone back and he was willing to give phone up, stating that he made all the calls he needed to make today, and that finally somebody listened and will be investigating this house where the children go missing.

## 2020-08-16 NOTE — ED Notes (Signed)
Phone provided  

## 2020-08-16 NOTE — Consult Note (Signed)
[facility] Face-to-Face Psychiatry Consult   Reason for Consult: Follow-up consult for 35 year old man with bipolar or schizoaffective disorder who is still in the emergency room Referring Physician: [facility] Patient Identification: Jackson [name] MRN:  [id number] Principal Diagnosis: Schizoaffective disorder, bipolar type (HCC) Diagnosis:  Principal Problem:   Schizoaffective disorder, bipolar type (HCC)   Total Time spent with patient: 30 minutes  Subjective:   Jackson [name] is a 35 y.o. male patient admitted with "you know there is nothing wrong with me".  HPI: Patient seen chart reviewed.  This patient used the telephone this morning to call the [employer].  He reportedly told him something about bombs or terrorists or something along that line.  They called back and spoke to some clinical staff in the emergency room.  The immediate situation seems to have calmed down.  Patient freely admits to having done this and bringing it up sets him off on a pressured speech about his grandiose delusions.  He tells a fast agitated story that jumps all over the place about his suppose it businesses and websites and companies and how he has confronted terrorists and pedophiles.  None of it holds together none of it makes any sense at all and the story changes constantly.  Patient has remained noncompliant with oral medication for the most part.  Past Psychiatric History: History of schizoaffective disorder with disruptive agitated behavior during periods of psychosis  Risk to Self:   Risk to Others:   Prior Inpatient Therapy:   Prior Outpatient Therapy:    Past Medical History:  Past Medical History:  Diagnosis Date  . Bipolar 1 disorder (HCC)   . Schizoaffective disorder (HCC)    History reviewed. No pertinent surgical history. Family History: History reviewed. No pertinent family history. Family Psychiatric  History: See previous Social History:  Social History   Substance  and Sexual Activity  Alcohol Use Not Currently     Social History   Substance and Sexual Activity  Drug Use Not Currently    Social History   Socioeconomic History  . Marital status: Single    Spouse name: Not on file  . Number of children: Not on file  . Years of education: Not on file  . Highest education level: Not on file  Occupational History  . Not on file  Tobacco Use  . Smoking status: Light Tobacco Smoker    Packs/day: 0.25    Types: Cigarettes  . Smokeless tobacco: Never Used  Vaping Use  . Vaping Use: Never used  Substance and Sexual Activity  . Alcohol use: Not Currently  . Drug use: Not Currently  . Sexual activity: Not on file  Other Topics Concern  . Not on file  Social History Narrative  . Not on file   Social Determinants of Health   Financial Resource Strain: Not on file  Food Insecurity: Not on file  Transportation Needs: Not on file  Physical Activity: Not on file  Stress: Not on file  Social Connections: Not on file   Additional Social History:    Allergies:  No Known Allergies  Labs: No results found for this or any previous visit (from the past 48 hour(s)).  Current Facility-Administered Medications  Medication Dose Route Frequency Provider Last Rate Last Admin  . haloperidol lactate (HALDOL) injection 5 mg  5 mg Intramuscular Q6H PRN [name], [name], MD   5 mg at 08/15/20 0605  . LORazepam (ATIVAN) tablet 2 mg  2 mg Oral Q4H PRN  [name], [name], MD   2 mg at 08/15/20 1107   Or  . LORazepam (ATIVAN) injection 2 mg  2 mg Intramuscular Q4H PRN [name], [name], MD   2 mg at 08/15/20 0605  . OLANZapine zydis (ZYPREXA) disintegrating tablet 30 mg  30 mg Oral Daily [name], [name], MD   30 mg at 08/16/20 0959  . valproic acid (DEPAKENE) 250 MG/5ML solution 500 mg  500 mg Oral TID Sharman Cheek, MD   500 mg at 08/16/20 8032   Current Outpatient Medications  Medication Sig Dispense Refill  . ARIPiprazole ER (ABILIFY MAINTENA) 400 MG SRER  injection Inject 2 mLs (400 mg total) into the muscle every 28 (twenty-eight) days. (Patient not taking: Reported on 04/11/2020) 1 each 1  . divalproex (DEPAKOTE ER) 500 MG 24 hr tablet Take 1,000 mg by mouth daily. (Patient not taking: Reported on 04/11/2020)    . traZODone (DESYREL) 100 MG tablet Take 1 tablet (100 mg total) by mouth at bedtime as needed for sleep. (Patient not taking: Reported on 04/11/2020) 30 tablet 1    Musculoskeletal: Strength & Muscle Tone: within normal limits Gait & Station: normal Patient leans: N/A  Psychiatric Specialty Exam: Physical Exam Vitals and nursing note reviewed.  Constitutional:      Appearance: He is well-developed and well-nourished.  HENT:     Head: Normocephalic and atraumatic.  Eyes:     Conjunctiva/sclera: Conjunctivae normal.     Pupils: Pupils are equal, round, and reactive to light.  Cardiovascular:     Heart sounds: Normal heart sounds.  Pulmonary:     Effort: Pulmonary effort is normal.  Abdominal:     Palpations: Abdomen is soft.  Musculoskeletal:        General: Normal range of motion.     Cervical back: Normal range of motion.  Skin:    General: Skin is warm and dry.  Neurological:     General: No focal deficit present.     Mental Status: He is alert.  Psychiatric:        Attention and Perception: Attention normal.        Mood and Affect: Affect is labile and inappropriate.        Speech: Speech is rapid and pressured.        Behavior: Behavior is agitated.        Thought Content: Thought content is paranoid and delusional. Thought content does not include homicidal or suicidal ideation.        Cognition and Memory: Cognition is impaired.        Judgment: Judgment is impulsive and inappropriate.     Review of Systems  Constitutional: Negative.   HENT: Negative.   Eyes: Negative.   Respiratory: Negative.   Cardiovascular: Negative.   Gastrointestinal: Negative.   Musculoskeletal: Negative.   Skin: Negative.    Neurological: Negative.   Psychiatric/Behavioral: Positive for sleep disturbance. The patient is nervous/anxious and is hyperactive.     Blood pressure (!) 142/100, pulse (!) 101, temperature 98.7 F (37.1 C), temperature source Oral, resp. rate 20, height 5\' 11"  (1.803 m), weight 88 kg, SpO2 100 %.Body mass index is 27.06 kg/m.  General Appearance: Casual  Eye Contact:  Good  Speech:  Pressured  Volume:  Increased  Mood:  Angry, Anxious and Irritable  Affect:  Inappropriate and Labile  Thought Process:  Disorganized  Orientation:  Full (Time, Place, and Person)  Thought Content:  Illogical, Delusions, Ideas of Reference:   Paranoia Delusions,  Paranoid Ideation, Rumination and Tangential  Suicidal Thoughts:  No  Homicidal Thoughts:  No  Memory:  Immediate;   Fair Recent;   Fair Remote;   Fair  Judgement:  Impaired  Insight:  Lacking  Psychomotor Activity:  Restlessness  Concentration:  Concentration: Poor  Recall:  [employer] of Knowledge:  Fair  Language:  Fair  Akathisia:  No  Handed:  Right  AIMS (if indicated):     Assets:  Social Support  ADL's:  Impaired  Cognition:  Impaired,  Mild  Sleep:        Treatment Plan Summary: Medication management and Plan  This is a 35 year old man with schizoaffective disorder.  He is in the grips of a psychotic mania.  Once patient gets started talking he immediately goes off on tangents and the content of his speech is basically all delusional.  Certainly there is no evidence or reason to think that he knows anything about any bombs or any actual violent crime and he has had no way to be involved in any of that whatsoever since being in the hospital.  Patient is not threatening directly and not suicidal but is so psychotic he cannot help but act out on his psychosis in ways that are extremely disruptive to the community and pose a risk to himself and others.  Tried to talk with him about this but his insight is lacking and he is very  agitated.  We will probably have to continue with IM medicine while we work on a referral to [facility].  I am in talks with the nursing staff about finding a way to limit his phone use to make it safe without completely cutting him off.  Disposition: Recommend psychiatric Inpatient admission when medically cleared.  Mordecai Rasmussen, MD 08/16/2020 12:29 PM

## 2020-08-16 NOTE — BH Assessment (Addendum)
This Clinical research associate contacted [facility] and spoke with Amore [phone]), Amore reports that patient is still under review and will contact TTS back when completed.  Update 3:50am: Writer received call from [facility] staff Amore who reports patient is currently on the waitlist

## 2020-08-16 NOTE — ED Provider Notes (Signed)
Emergency Medicine Observation Re-evaluation Note  Jackson [name] is a 35 y.o. male, seen on rounds today.  Pt initially presented to the ED for complaints of Psychiatric Evaluation Currently, the patient is laying in bed, requesting something to eat.  Physical Exam  BP (!) 142/100 (BP Location: Right Arm)   Pulse (!) 101   Temp 98.7 F (37.1 C) (Oral)   Resp 20   Ht 5\' 11"  (1.803 m)   Wt 88 kg   SpO2 100%   BMI 27.06 kg/m  Physical Exam Constitutional: Resting comfortably. Eyes: Conjunctivae are normal. Head: Atraumatic. Nose: No congestion/rhinnorhea. Mouth/Throat: Mucous membranes are moist. Neck: Normal ROM Cardiovascular: No cyanosis noted. Respiratory: Normal respiratory effort. Gastrointestinal: Non-distended. Genitourinary: deferred Musculoskeletal: No lower extremity tenderness nor edema. Neurologic:  Normal speech and language. No gross focal neurologic deficits are appreciated. Skin:  Skin is warm, dry and intact. No rash noted.    ED Course / MDM  EKG:    I have reviewed the labs performed to date as well as medications administered while in observation.  Recent changes in the last 24 hours include none.  Plan  Current plan is for psychiatric admission pending placement. Patient is under full IVC at this time.   , MD 08/16/20 [phone]

## 2020-08-16 NOTE — ED Notes (Signed)
Dr [name] at bedside to consult patient  

## 2020-08-16 NOTE — ED Notes (Signed)
Patient calm and cooperative. Ask to use the phone to call his mother.  Patient said he just wants to call his mother because this is a good time for her to call.This Clinical research associate explained to patient phone hours. Patient said he doesn't know why the police brought him to the hospital . He said that he went to a person's house that is familiar to him and tap at his door. And they brought him to the hospital.  He said "is it a crime to tap on a person's door?". He still have delusional thoughts.

## 2020-08-16 NOTE — ED Notes (Signed)
Patient requesting blankets, warm blankets provided, patient went to his room and layed down, will continue to monitor.

## 2020-08-16 NOTE — ED Notes (Addendum)
Patient asked this writer what area he is in and how long does patients stay in the psych hold. This Clinical research associate explained that it is all individual basis. Patient said " I want to go home". This Clinical research associate explained to him that the Psych EDP has to reassess you in the morning. He said I know the psych EDP and he doesn't let anyone go. The patient said he is here because he was trying to save trafficked girls and broke a house. He is still appears delusional. He is calm and cooperative.

## 2020-08-16 NOTE — ED Notes (Addendum)
Patient spilled his medication accidentally. Pharmacy sent another medication per this writer request.  Patient went to sleep. Unable to administer bed time medication. No issue.

## 2020-08-16 NOTE — ED Notes (Signed)
Snack and beverage given. 

## 2020-08-16 NOTE — ED Notes (Addendum)
Patient woke up at this time asking for the phone and food. Staff and this Clinical research associate reminded patient the phone hours. Snack and beverage given. No issues.

## 2020-08-16 NOTE — ED Notes (Signed)
IVC/  PENDING  PLACEMENT 

## 2020-08-16 NOTE — ED Notes (Signed)
Report to include situation, background, assessment and recommendations from Jeannette RN. Patient sleeping, respirations regular and unlabored. Q15 minute rounds and security camera observation to continue.    

## 2020-08-17 NOTE — ED Notes (Signed)
Patient pressed on speaking to someone who can let him leave. He said" I need to speak with the head of the hospital, anyone that can let me out". This Clinical research associate called the charge and notified.

## 2020-08-17 NOTE — ED Notes (Signed)
Patient woke up asking "In what pretences am I here?". He said " I only knocked at the door and the owner wasn't there, It's his neighbors that called a police on me".  The police came and brought me here. Can I go home? This writer explained to the patient that reason he is here and that he needs to be evaluated again. Patient asked for drink and went to his room.

## 2020-08-17 NOTE — ED Notes (Signed)
Patient at door requesting snack and a boxed lunch

## 2020-08-17 NOTE — ED Notes (Signed)
Hourly rounding reveals patient in room. No complaints, stable, in no acute distress. Q15 minute rounds and monitoring via Security Cameras to continue. 

## 2020-08-17 NOTE — ED Notes (Signed)
Pt. Alert and oriented, warm and dry, in no distress. Pt. Denies SI, HI, and AVH. Patient asking for medical board's phone number to call and complain due to him being held here in unit. Patient states, "I have been acting the way I have been cause of the medications that yall have been pumping into my body."  Pt. Encouraged to let nursing staff know of any concerns or needs.

## 2020-08-17 NOTE — ED Notes (Signed)
INVOLUNTARY continues to wait list at [facility] and IVC papers needs renewing on 08/18/20

## 2020-08-17 NOTE — BH Assessment (Signed)
This Clinical research associate contacted [facility] and spoke with Merlyn Albert [phone]) who reports patient continues to be on the waitlist

## 2020-08-17 NOTE — ED Notes (Signed)
Patient early riser, up before 0600 as per prior nurse requesting shower. Patient provided with clean cloth and oral hygiene supplies.

## 2020-08-17 NOTE — ED Notes (Signed)
Patient in bed with eyes closed, will continue to monitor.

## 2020-08-17 NOTE — ED Notes (Signed)
Patient refusing vitals at this time.

## 2020-08-17 NOTE — ED Notes (Signed)
Patient in shower since 0730, was asked to come out multiple times. Patient responded to request with " what else ids there for me to do". Patient finally out of shower at 0815.

## 2020-08-17 NOTE — ED Notes (Signed)

## 2020-08-17 NOTE — ED Notes (Signed)
Snack given to patient. This writer removed mattress from room that belonged in another room.

## 2020-08-17 NOTE — ED Notes (Signed)
Patient refused scheduled medications.

## 2020-08-17 NOTE — ED Notes (Signed)
Patient sleeping refused vital signs.

## 2020-08-18 NOTE — ED Notes (Addendum)
This Clinical research associate along with security staff tried to verbal deescalate patient. Patient became more aggressive toward security staff. Patient stating, "Next time I am just going to commit suicide by police." :Patient calling security staff racial slurs. And getting into security staffs face. This Clinical research associate administered PRN IM medications with the assistance of security. Charge RN in room when medication administered. EDP Bradler made aware.

## 2020-08-18 NOTE — ED Provider Notes (Signed)
Emergency department provider addendum note  Called to bedside after patient was reportedly woke up and screaming stating "I am tired of being locked up here, you cannot keep me locked up here".  Patient became increasingly belligerent with staff despite verbal de-escalation.  Patient had as needed IM anxiolytic medications that were given by nurse Levada Schilling with the help of our security.  Patient sitting but agitated upon my arrival.   Merwyn Katos, MD 08/18/20 [phone]

## 2020-08-18 NOTE — ED Notes (Addendum)
Patient woke up screaming stating, "I am tired of being locked up in here, you can't keep me locked up in here. You scared of the this n-word" Patient pacing in dayroom talking to self, "All I did was knock on a door and people was holding guns at me and then the police brought me here to the hospital I did nothing wrong."  Punching door and screaming at nurse.  This Clinical research associate asked patient to please have a seat and I would come talk with him as soon as I had another staff member in nurses station. Patient continued with behavior.

## 2020-08-18 NOTE — ED Notes (Signed)
Pt dinner meal tray given with ginger ale.   BEHAVIORAL HEALTH ROUNDING Patient sleeping: Yes. Patient alert and oriented: yes Behavior appropriate: Yes.  ; If no, describe:   Nutrition and fluids offered: Yes  Toileting and hygiene offered: Yes  Sitter present: no Law enforcement present: Yes   Patient assigned to appropriate care area. Patient oriented to unit/care area: Informed that, for their safety, care areas are designed for safety and monitored by security cameras at all times; and visiting hours explained to patient. Patient verbalizes understanding, and verbal contract for safety obtained.  ED BHU PLACEMENT JUSTIFICATION Is the patient under IVC or is there intent for IVC: Yes.   Is the patient medically cleared: Yes.   Is there vacancy in the ED BHU: Yes.   Is the population mix appropriate for patient: Yes.   Is the patient awaiting placement in inpatient or outpatient setting: Yes.   Has the patient had a psychiatric consult: Yes.   Survey of unit performed for contraband, proper placement and condition of furniture, tampering with fixtures in bathroom, shower, and each patient room: Yes.  ; Findings: trash APPEARANCE/BEHAVIOR calm, cooperative and adequate rapport can be established NEURO ASSESSMENT Orientation: time, place and person Hallucinations: No.None noted (Hallucinations) Speech: Normal Gait: normal RESPIRATORY ASSESSMENT Normal expansion.  Clear to auscultation.  No rales, rhonchi, or wheezing., No chest wall tenderness., No kyphosis or scoliosis. CARDIOVASCULAR ASSESSMENT regular rate and rhythm, S1, S2 normal, no murmur, click, rub or gallop GASTROINTESTINAL ASSESSMENT soft, nontender, BS WNL, no r/g EXTREMITIES normal strength, tone, and muscle mass PLAN OF CARE Provide calm/safe environment. Vital signs assessed twice daily. ED BHU Assessment once each 12-hour shift. Collaborate with intake RN daily or as condition indicates. Assure the ED provider  has rounded once each shift. Provide and encourage hygiene. Provide redirection as needed. Assess for escalating behavior; address immediately and inform ED provider.  Assess family dynamic and appropriateness for visitation as needed: Yes.  ; If necessary, describe findings: no visitors noted  Educate the patient/family about BHU procedures/visitation: Yes.  ; If necessary, describe findings:     ENVIRONMENTAL ASSESSMENT Potentially harmful objects out of patient reach: Yes.   Personal belongings secured: Yes Patient dressed in hospital provided attire only: Yes.   Plastic bags out of patient reach: Yes.   Patient care equipment (cords, cables, call bells, lines, and drains) shortened, removed, or accounted for: Yes.   Equipment and supplies removed from bottom of stretcher: Yes.   Potentially toxic materials out of patient reach: Yes.   Sharps container removed or out of patient reach: Yes.

## 2020-08-18 NOTE — ED Notes (Signed)
;  pt to toilet and threw away trash requesting another meal tray, pt consented to VS later but refusing medicine

## 2020-08-18 NOTE — ED Notes (Signed)
RN provided pt with shower supplies. Pt got in the shower while pt waited on breakfast to arrive.   Pt received breakfast tray and an extra sandwich tray. It is sitting in his room waiting on him as he is showering.   RN cleaned pt room while he was in shower.

## 2020-08-18 NOTE — ED Notes (Signed)
He has refused to allow me to take his VS this am  He states  "Not right now- maybe later"

## 2020-08-18 NOTE — ED Notes (Signed)
Patient currently laying in bed. Will continue to monitor.

## 2020-08-18 NOTE — ED Notes (Signed)
Patient in bathroom

## 2020-08-18 NOTE — ED Notes (Signed)
Patient out of bathroom and in room.

## 2020-08-18 NOTE — ED Notes (Signed)
Pt drank ativan and Depakote in cranberry juice  Pt given snack tray

## 2020-08-18 NOTE — ED Notes (Signed)
IVC/pending placement/IVC papers need to be renewed by 5pm 08/18/20.

## 2020-08-18 NOTE — BH Assessment (Signed)
This Clinical research associate contacted [facility] and spoke withRobert [phone]) who reports patient continues to be on the waitlist

## 2020-08-18 NOTE — ED Provider Notes (Signed)
Emergency Medicine Observation Re-evaluation Note  Jackson [name] is a 35 y.o. male, seen on rounds today.  Pt initially presented to the ED for complaints of Psychiatric Evaluation  Currently, the patient is is no acute distress. Denies any concerns at this time.  Patient has blanket over his head but he denies any concerns  Physical Exam  Blood pressure 122/86, pulse 86, temperature 98.6 F (37 C), temperature source Oral, resp. rate 20, height 5\' 11"  (1.803 m), weight 88 kg, SpO2 98 %.  Physical Exam General: No apparent distress HEENT: moist mucous membranes CV: RRR Pulm: Normal WOB GI: soft and non tender MSK: no edema or cyanosis Neuro: face symmetric, moving all extremities     ED Course / MDM     I have reviewed the labs performed to date as well as medications administered while in observation.  Recent changes in the last 24 hours include none   Plan   Current plan is to continue to wait for psych plan/placement if felt warranted  Patient is under full IVC at this time.   , MD 08/18/20 [phone]

## 2020-08-18 NOTE — BH Assessment (Signed)
This Clinical research associate contacted [facility] and spoke with Pam [phone]) who reports patient continues to be on the waitlist

## 2020-08-19 NOTE — ED Notes (Signed)
Pt given breakfast tray at this time. 

## 2020-08-19 NOTE — ED Notes (Signed)
Pt awake and ate food from snack tray, then ambulatory to toilet without difficulty, pt urinated with door open and then came to RN station to request meal tray, pt informed the last meal tray was given to pt, PB and crackers and fluids offered and refused, pt appears returning to bed

## 2020-08-19 NOTE — ED Provider Notes (Signed)
Emergency Medicine Observation Re-evaluation Note  Jackson [name] is a 35 y.o. male, seen on rounds today.  Pt initially presented to the ED for complaints of Psychiatric Evaluation Currently, the patient is upset and agitated at being moved to different section of the BHU, continually requesting that he be discharged.  Physical Exam  BP 109/79   Pulse 63   Temp 98.4 F (36.9 C) (Oral)   Resp 18   Ht 5\' 11"  (1.803 m)   Wt 88 kg   SpO2 100%   BMI 27.06 kg/m  Physical Exam Constitutional: Upset and agiatetd. Eyes: Conjunctivae are normal. Head: Atraumatic. Nose: No congestion/rhinnorhea. Mouth/Throat: Mucous membranes are moist. Neck: Normal ROM Cardiovascular: No cyanosis noted. Respiratory: Normal respiratory effort. Gastrointestinal: Non-distended. Genitourinary: deferred Musculoskeletal: No lower extremity tenderness nor edema. Neurologic:  Normal speech and language. No gross focal neurologic deficits are appreciated. Skin:  Skin is warm, dry and intact. No rash noted.    ED Course / MDM  EKG:    I have reviewed the labs performed to date as well as medications administered while in observation.  Patient upset and agitated at being moved to a different room, eventually allowed himself to be carried to different room by security. After which he became increasingly agitated.  Plan  Current plan is for psychiatric admission. Patient now increasingly agitated and will require calming medications to ensure his safety as well as safety of staff. Patient is under full IVC at this time.   , MD 08/19/20 1039

## 2020-08-19 NOTE — ED Notes (Addendum)
Pt refusing to go to new room pt states yall can carry me there. Pt carried by security and [employer] to new room area. Pt walking into bathroom at this time.

## 2020-08-19 NOTE — ED Notes (Signed)
[employer] Officer at bedside speaking with pt.

## 2020-08-19 NOTE — ED Notes (Signed)
Pt given crackers and requesting to speak with MD in regards to him being released. Informed pt we will let MD know once they make their rounds. Pt verbalized understanding.

## 2020-08-19 NOTE — ED Notes (Signed)
Patient is IVC pending placement 

## 2020-08-19 NOTE — ED Notes (Addendum)
Pt requesting food. Pt informed that breakfast hasn't come yet. Pt informed that this RN would bring food as soon as it comes. Pt denies any further needs at this time.

## 2020-08-19 NOTE — ED Notes (Signed)
Snack;provided pt refusing vital signs.

## 2020-08-19 NOTE — ED Notes (Signed)
MD Larinda Buttery at bedside speaking with pt and Baxter Flattery along with Security

## 2020-08-19 NOTE — ED Notes (Signed)
Pt banging on door again stating he is bleeding from injections. Pt given bandage under door.

## 2020-08-19 NOTE — ED Notes (Signed)
Attempting to move pt into different room to allow for several more pts to come over. Pt refusing to go into locked area due to recent covid pt. Pt informed that the area has been cleaned and sanitized. Pt still refusing. Pt stating we can just send him home. Charge RN [name] called and informed. Pt still refusing.

## 2020-08-19 NOTE — ED Notes (Signed)
Pt banging on window in locked area. Medications prepared. [employer] and Security at bedside. This RN and Polo Riley able to give medications IM to pt. Pt refusing his Zyprexa and Depakote medications at this time and states "Get out of here Bitch". Pt's room exited at this time.

## 2020-08-19 NOTE — ED Notes (Signed)
Pt back to RN station and was to know time of release "I want to go home", pt oriented to circumstances that brought pt to ED  Pt relays a difficult to follow story which includes an exaggerated timeline "I'ver been here for three weeks!" pt then yelling and pounding wall with fist - pt asked to calm down and "please talk out his feelings" pt told that he arrived just over a week ago, "well I was at another hospital before this one - I was right there [point to 6,7,8 hall] and those cops are lying on me! How is it a nigger can't be [place] but the first [place] was black! He would make toys and creep into orphanages for the little kids and put them around their tree.  If they even had a tree. This was in the start of [place]. Where are the people that pointed the guns at me?!"    Pt then began to bang on the glass to RN station and yell, pt calmed, and oriented to positive influences in his life. Pt's mother involvement and discussion with mother's conversation from last week cited.  Pt seems surprised at this, but then dismissed the idea with slurs about his mother "I never saw her for sixteen years!"  Pt then farted at RN and walked towards his room, "you know why I was at that house? My father and I were staking out a bunch of pedophiles and they were in that house. How can I climb into a window that's ten, fifteen feet off the ground!"  Pt reminded that he is making progress towards leaving, that talking and medications were important to forward progress.  Pt then threatened to punch a hole in the wall and "throw my shit in it - let's see you clean that up!"  Pt then spit on the floor.    Pt then said "I'm going back to my room. This conversation is just making me mad."  Pt did return to bed and lay down under covers.  Pt refusing VS

## 2020-08-20 DIAGNOSIS — F250 Schizoaffective disorder, bipolar type: Secondary | ICD-10-CM | POA: Diagnosis not present

## 2020-08-20 NOTE — ED Notes (Signed)
Patient is taking a shower at this time, He wanted lights out while He was taking shower, nurse did cut the hall lights out while showering, nurse will continue to monitor, security camera's intact for safety.

## 2020-08-20 NOTE — Consult Note (Signed)
[facility] Face-to-Face Psychiatry Consult   Reason for Consult: Follow-up consult for 35 year old man with bipolar mania or schizoaffective disorder Referring Physician: Paduchowski Patient Identification: Jackson [name] MRN:  [id number] Principal Diagnosis: Schizoaffective disorder, bipolar type (HCC) Diagnosis:  Principal Problem:   Schizoaffective disorder, bipolar type (HCC)   Total Time spent with patient: 30 minutes  Subjective:   Jackson [name] is a 35 y.o. male patient admitted with "why cannot I leave?".  HPI: Patient seen chart reviewed.  Patient has been here in the emergency room now for an extended period of time [facility] state hospital.  He continues to be only slightly compliant occasionally with medication and continues to display hyperactivity hyper verbality frequent inappropriate and dangerous behavior including pounding his head on the glass window.  Frequent loud vocalization.  Disorganized thinking.  No insight.  Past Psychiatric History: Past history of schizoaffective disorder with extended periods of dangerous mania.  Risk to Self:   Risk to Others:   Prior Inpatient Therapy:   Prior Outpatient Therapy:    Past Medical History:  Past Medical History:  Diagnosis Date  . Bipolar 1 disorder (HCC)   . Schizoaffective disorder (HCC)    History reviewed. No pertinent surgical history. Family History: History reviewed. No pertinent family history. Family Psychiatric  History: See previous Social History:  Social History   Substance and Sexual Activity  Alcohol Use Not Currently     Social History   Substance and Sexual Activity  Drug Use Not Currently    Social History   Socioeconomic History  . Marital status: Single    Spouse name: Not on file  . Number of children: Not on file  . Years of education: Not on file  . Highest education level: Not on file  Occupational History  . Not on file  Tobacco Use  . Smoking status: Light Tobacco  Smoker    Packs/day: 0.25    Types: Cigarettes  . Smokeless tobacco: Never Used  Vaping Use  . Vaping Use: Never used  Substance and Sexual Activity  . Alcohol use: Not Currently  . Drug use: Not Currently  . Sexual activity: Not on file  Other Topics Concern  . Not on file  Social History Narrative  . Not on file   Social Determinants of Health   Financial Resource Strain: Not on file  Food Insecurity: Not on file  Transportation Needs: Not on file  Physical Activity: Not on file  Stress: Not on file  Social Connections: Not on file   Additional Social History:    Allergies:  No Known Allergies  Labs: No results found for this or any previous visit (from the past 48 hour(s)).  Current Facility-Administered Medications  Medication Dose Route Frequency Provider Last Rate Last Admin  . diphenhydrAMINE (BENADRYL) injection 50 mg  50 mg Intramuscular Q6H PRN Aman Bonet T, MD   50 mg at 08/18/20 0509  . haloperidol lactate (HALDOL) injection 5 mg  5 mg Intramuscular Q6H PRN Maeleigh Buschman, [name], MD   5 mg at 08/18/20 0509  . LORazepam (ATIVAN) tablet 2 mg  2 mg Oral Q4H PRN Wei Newbrough, [name], MD   2 mg at 08/18/20 2204   Or  . LORazepam (ATIVAN) injection 2 mg  2 mg Intramuscular Q4H PRN Welby Montminy, [name], MD   2 mg at 08/18/20 0508  . OLANZapine (ZYPREXA) injection 30 mg  30 mg Intramuscular QHS PRN Kanasia Gayman, [name], MD      . Dennison Bulla  zydis (ZYPREXA) disintegrating tablet 30 mg  30 mg Oral Daily Adlai Sinning T, MD   30 mg at 08/18/20 1006  . valproic acid (DEPAKENE) 250 MG/5ML solution 500 mg  500 mg Oral TID Sharman Cheek, MD   500 mg at 08/19/20 2133   Current Outpatient Medications  Medication Sig Dispense Refill  . ARIPiprazole ER (ABILIFY MAINTENA) 400 MG SRER injection Inject 2 mLs (400 mg total) into the muscle every 28 (twenty-eight) days. 1 each 1  . divalproex (DEPAKOTE ER) 500 MG 24 hr tablet Take 1,000 mg by mouth daily.    . traZODone (DESYREL) 100 MG tablet  Take 1 tablet (100 mg total) by mouth at bedtime as needed for sleep. 30 tablet 1    Musculoskeletal: Strength & Muscle Tone: within normal limits Gait & Station: normal Patient leans: N/A  Psychiatric Specialty Exam: Physical Exam Vitals and nursing note reviewed.  Constitutional:      Appearance: He is well-developed and well-nourished.  HENT:     Head: Normocephalic and atraumatic.  Eyes:     Conjunctiva/sclera: Conjunctivae normal.     Pupils: Pupils are equal, round, and reactive to light.  Cardiovascular:     Heart sounds: Normal heart sounds.  Pulmonary:     Effort: Pulmonary effort is normal.  Abdominal:     Palpations: Abdomen is soft.  Musculoskeletal:        General: Normal range of motion.     Cervical back: Normal range of motion.  Skin:    General: Skin is warm and dry.  Neurological:     General: No focal deficit present.     Mental Status: He is alert.  Psychiatric:        Attention and Perception: He perceives auditory hallucinations.        Mood and Affect: Mood is elated. Affect is labile and inappropriate.        Speech: Speech is rapid and pressured.        Behavior: Behavior is agitated.        Thought Content: Thought content is delusional. Thought content does not include homicidal or suicidal ideation.        Cognition and Memory: Cognition is impaired. Memory is impaired.        Judgment: Judgment is inappropriate.     Review of Systems  Constitutional: Negative.   HENT: Negative.   Eyes: Negative.   Respiratory: Negative.   Cardiovascular: Negative.   Gastrointestinal: Negative.   Musculoskeletal: Negative.   Skin: Negative.   Neurological: Negative.   Psychiatric/Behavioral: Positive for hallucinations. The patient is nervous/anxious.     Blood pressure 109/79, pulse 63, temperature 98.4 F (36.9 C), temperature source Oral, resp. rate 18, height 5\' 11"  (1.803 m), weight 88 kg, SpO2 100 %.Body mass index is 27.06 kg/m.  General  Appearance: Casual  Eye Contact:  Fair  Speech:  Pressured  Volume:  Increased  Mood:  Euphoric  Affect:  Congruent  Thought Process:  Disorganized  Orientation:  Full (Time, Place, and Person)  Thought Content:  Illogical, Delusions, Paranoid Ideation and Tangential  Suicidal Thoughts:  No  Homicidal Thoughts:  No  Memory:  Immediate;   Fair Recent;   Fair Remote;   Fair  Judgement:  Poor  Insight:  Lacking  Psychomotor Activity:  Restlessness  Concentration:  Concentration: Poor  Recall:  Poor  Fund of Knowledge:  Poor  Language:  Poor  Akathisia:  No  Handed:  Right  AIMS (if indicated):  Assets:  Desire for Improvement Physical Health Resilience  ADL's:  Impaired  Cognition:  Impaired,  Mild  Sleep:        Treatment Plan Summary: Medication management and Plan Patient was once again educated about the reason for his hospitalization and the utility of medication.  Encouraged to be more compliant with patient and discuss specific concerns about medicine.  Patient continues to have episodes of dangerousness and aggression that would be unmanageable on our psychiatric ward.  He has had multiple occasions of restraints since being in the emergency room as well as evidence of self-injury pounding his head on walls.  We are asking for expedited treatment to the state hospital because he needs much longer term and more intensive treatment than most patients.  Disposition: Recommend psychiatric Inpatient admission when medically cleared.  Mordecai Rasmussen, MD 08/20/2020 3:23 PM

## 2020-08-20 NOTE — ED Notes (Signed)
Pt speaking with RN regarding why he is here. Pt states "I don't know why a bunch of cops brought me here, you know that house was a pedophile house". Pt relates he is paying rent for a house and paying for heat and he is not home right now. Pt states "what am I supposed to do, just live here at the hospital. They got me locked up in this little room, won't tell me shit about when i'm leaving". Pt states he has seen dr. Toni Amend, but he "should be a Nazi" and states because of him all the food at his house is rotting. Pt states "my mom won't come up here and do anything for my house, she'd just let it go".

## 2020-08-20 NOTE — ED Notes (Signed)
Patient is freezing in His room, the area is colder than other rooms, work order was put in last night on third shift to adjust the heat , Patient made a bed in the bathroom, and had shower running, states " I can stay warm from the steam from the water, Nurse encouraged him to turn water off, and he said " I'm taking a nap" Nurse told him that if He would agree to stay in room 1, and not go to any other Patient's rooms that He could move to that room because it was warmer, and He agreed, Patient gathered His blankets and pillows and ambulated to room 1, He is calm and cooperative, He does repeat himself about how He should not be here, and that He did nothing wrong. Nurse listens and coaxes him to rest. Patient laid down and staff will continue to monitor.
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
## 2020-08-20 NOTE — BH Assessment (Signed)
This writer contacted [facility] and spoke withJay([phone]) who reported pt remains on the wait list. 

## 2020-08-20 NOTE — ED Notes (Signed)
Hourly rounding reveals patient in room. No complaints, stable, in no acute distress. Q15 minute rounds and monitoring via Security Cameras to continue. 

## 2020-08-20 NOTE — ED Notes (Signed)
Snack and beverage given. 

## 2020-08-20 NOTE — ED Notes (Signed)
Patient ask for ice-cream, He states that " I need to talk to another Doctor and get out of here, I don't belong here, Guard handed him His ice-cream and He became quiet, went back to His room, will continue to monitor.

## 2020-08-20 NOTE — ED Provider Notes (Signed)
Emergency Medicine Observation Re-evaluation Note  Jackson [name] is a 35 y.o. male, seen on rounds today.  Pt initially presented to the ED for complaints of Psychiatric Evaluation Currently, the patient is calm cooperative but somewhat manic appearing.Marland Kitchen  Physical Exam  BP 109/79   Pulse 63   Temp 98.4 F (36.9 C) (Oral)   Resp 18   Ht 5\' 11"  (1.803 m)   Wt 88 kg   SpO2 100%   BMI 27.06 kg/m  Physical Exam General: Calm cooperative, no distress, but does have rapid and loud speech at times but in a nonthreatening manner. Cardiac: Regular rate and rhythm around 70 bpm. Lungs: Clear to auscultation bilaterally Psych: Heightened energy state, almost manic, but nonthreatening  ED Course / MDM   No new labs over the past 24 hours.  Plan  [facility]  Patient is currently on a wait list. Patient is under full IVC at this time.   , MD 08/20/20 1425

## 2020-08-20 NOTE — ED Notes (Signed)
Report to include situation, background, assessment and recommendations from Wendy RN. Patient sleeping, respirations regular and unlabored. Q15 minute rounds and security camera observation to continue.    

## 2020-08-20 NOTE — ED Notes (Signed)
Report to wendy, rn

## 2020-08-20 NOTE — ED Notes (Signed)
Patient ate 100% of lunch and beverage.  

## 2020-08-20 NOTE — ED Notes (Signed)
Patient ate 100% of supper and beverage.  

## 2020-08-20 NOTE — ED Notes (Signed)
Pt requesting additional po fluids and snack. Pt informed will provide food as pt states is too hungry to sleep. Pt appreciative and requested to thank RN by bumping elbows. Pt smiling at Rn and asking RN about time. Pt oriented to time by RN. Pt states "thank you for treated me like a person". Pt informed this RN will listen to pt's concerns and attempt to address issues within this RN's control.

## 2020-08-20 NOTE — ED Notes (Signed)
Patient is singing at this time, He is easily redirected when need be, will continue to monitor, camera surveillance in progress for safety.

## 2020-08-21 NOTE — ED Notes (Signed)
Hourly rounding reveals patient in room. No complaints, stable, in no acute distress. Q15 minute rounds and monitoring via Security Cameras to continue. 

## 2020-08-21 NOTE — ED Notes (Signed)
Report to include Situation, Background, Assessment, and Recommendations received from Dorothy RN. Patient alert and oriented, warm and dry, in no acute distress. Patient denies SI, HI, AVH and pain. Patient made aware of Q15 minute rounds and security cameras for their safety. Patient instructed to come to me with needs or concerns.  

## 2020-08-21 NOTE — ED Provider Notes (Signed)
Emergency Medicine Observation Re-evaluation Note  Jackson [name] is a 35 y.o. male, seen on rounds today.  Pt initially presented to the ED for complaints of Psychiatric Evaluation Currently, the patient is resting comfortably.  Physical Exam  BP 124/72 (BP Location: Right Arm)   Pulse 72   Temp 98 F (36.7 C) (Oral)   Resp 18   Ht 5\' 11"  (1.803 m)   Wt 88 kg   SpO2 100%   BMI 27.06 kg/m  Physical Exam General: in NAD Cardiac: well perfused Lungs: even and unlabored resp Psych: calm  ED Course / MDM  EKG:    I have reviewed the labs performed to date as well as medications administered while in observation.  Recent changes in the last 24 hours include none.  Plan  Current plan is for psych dispo. Patient is under full IVC at this time.   , MD 08/21/20 [phone]
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
## 2020-08-21 NOTE — ED Notes (Signed)
VS not taken, patient asleep 

## 2020-08-21 NOTE — ED Notes (Signed)
IVC/  PENDING  PLACEMENT 

## 2020-08-22 NOTE — ED Notes (Signed)
Patient up early requesting a shower. Clean clothes and oral hygiene supplies provided. Patient in shower, safety maintained. Will continue to monitor.

## 2020-08-22 NOTE — ED Notes (Signed)
Hourly rounding reveals patient in room. No complaints, stable, in no acute distress. Q15 minute rounds and monitoring via Security Cameras to continue. 

## 2020-08-22 NOTE — ED Notes (Signed)
Patient refusing vitals at this time.

## 2020-08-22 NOTE — ED Notes (Signed)
Call received form Jackson [name] Session on his contact list requesting to know how terrel was doing today was also requesting personal patient information. He was advised that he is doing well,and that I will have a physician contact him in regards to all his questions and concerns about patient;s plan of care.

## 2020-08-22 NOTE — ED Notes (Signed)

## 2020-08-22 NOTE — ED Notes (Signed)
Patient calmed down since other party was removed from unit. Patient in room taking a nap.

## 2020-08-22 NOTE — ED Notes (Signed)
Patient sleeping comfortably, will continue to monitor

## 2020-08-22 NOTE — ED Notes (Signed)
Patient reports he feels like he has pain in his heart, patient actively crying patient shares with nurse he does not know why he is feeling like this, he states he thinks the other patient triggered these feelings, and he is now feeling very anxious. Patient vss obtained and given a PRN medication. Will continue to monitor.

## 2020-08-22 NOTE — ED Notes (Signed)
Patient up in day room to bathroom reports feeling much better.

## 2020-08-22 NOTE — ED Notes (Signed)
Patient becoming agitated due to patient banging on the windows and being extremely loud.

## 2020-08-22 NOTE — ED Notes (Signed)
VS not taken. Patient is sleep.

## 2020-08-22 NOTE — BH Assessment (Signed)
This Clinical research associate contacted [facility] and spoke with Merlyn Albert [phone]) who reported pt remains on the wait list.

## 2020-08-23 DIAGNOSIS — F250 Schizoaffective disorder, bipolar type: Secondary | ICD-10-CM | POA: Diagnosis not present
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
## 2020-08-23 NOTE — Consult Note (Signed)
[facility] Face-to-Face Psychiatry Consult   Reason for Consult: Follow-up note for this 35 year old man with schizoaffective disorder who has been waiting in the emergency room now for quite a while Referring Physician: Katrinka Blazing Patient Identification: Jackson [name] MRN:  [id number] Principal Diagnosis: Schizoaffective disorder, bipolar type (HCC) Diagnosis:  Principal Problem:   Schizoaffective disorder, bipolar type (HCC)   Total Time spent with patient: 30 minutes  Subjective:   Jackson [name] is a 35 y.o. male patient admitted with "I do not deserve this".  HPI: Patient seen chart reviewed.  Today the patient was very agitated in the morning.  He got angry and started pounding his head on the glass pounding his fists on the door the walls and the glass.  Through articles of furniture around in the area where he is staying.  Spitting on the door.  I came back to see him through the door.  Patient was agitated hyperverbal and angry.  Pressured slightly disorganized speech.  He wants to make the point that the event that actually brought him to the hospital was, in his mind, trivial and that he does not deserve to be in the hospital.  His insight is partial at best.  He remains only partially cooperative with medication.  He is labile with periods of this kind of extreme anger alternating with periods of goofy euphoria and pleasantness.  He did not make any direct threats to harm anybody.  He does pounded his head on things at times but denies that he is trying to hurt himself.  Continues to show evidence of some grandiosity.  I made an attempt to reach his mother who is his guardian today and have just left a voicemail message.  Past Psychiatric History: Patient has a long history of schizoaffective disorder with episodes of mania.  During those episodes he will engage in behavior that gets himself in a lot of trouble out in the community.  To some extent the fact that he is obviously quite  intelligent probably works against him as he then gets involved in all sorts of activities when he is manic which provides more opportunity for him to transgress against social norms.  In the past it has taken a long time to get him stable on medicine.  Risk to Self:   Risk to Others:   Prior Inpatient Therapy:   Prior Outpatient Therapy:    Past Medical History:  Past Medical History:  Diagnosis Date  . Bipolar 1 disorder (HCC)   . Schizoaffective disorder (HCC)    History reviewed. No pertinent surgical history. Family History: History reviewed. No pertinent family history. Family Psychiatric  History: See previous notes Social History:  Social History   Substance and Sexual Activity  Alcohol Use Not Currently     Social History   Substance and Sexual Activity  Drug Use Not Currently    Social History   Socioeconomic History  . Marital status: Single    Spouse name: Not on file  . Number of children: Not on file  . Years of education: Not on file  . Highest education level: Not on file  Occupational History  . Not on file  Tobacco Use  . Smoking status: Light Tobacco Smoker    Packs/day: 0.25    Types: Cigarettes  . Smokeless tobacco: Never Used  Vaping Use  . Vaping Use: Never used  Substance and Sexual Activity  . Alcohol use: Not Currently  . Drug use: Not Currently  . Sexual activity: Not  on file  Other Topics Concern  . Not on file  Social History Narrative  . Not on file   Social Determinants of Health   Financial Resource Strain: Not on file  Food Insecurity: Not on file  Transportation Needs: Not on file  Physical Activity: Not on file  Stress: Not on file  Social Connections: Not on file   Additional Social History:    Allergies:  No Known Allergies  Labs: No results found for this or any previous visit (from the past 48 hour(s)).  Current Facility-Administered Medications  Medication Dose Route Frequency Provider Last Rate Last Admin   . diphenhydrAMINE (BENADRYL) injection 50 mg  50 mg Intramuscular Q6H PRN Kynedi Profitt T, MD   50 mg at 08/18/20 0509  . haloperidol lactate (HALDOL) injection 5 mg  5 mg Intramuscular Q6H PRN Analiah Drum, [name], MD   5 mg at 08/18/20 0509  . LORazepam (ATIVAN) tablet 2 mg  2 mg Oral Q4H PRN Ryanne Morand, [name], MD   2 mg at 08/23/20 0847   Or  . LORazepam (ATIVAN) injection 2 mg  2 mg Intramuscular Q4H PRN Dallon Dacosta, [name], MD   2 mg at 08/18/20 0508  . OLANZapine (ZYPREXA) injection 30 mg  30 mg Intramuscular QHS PRN Yer Castello T, MD      . OLANZapine zydis (ZYPREXA) disintegrating tablet 30 mg  30 mg Oral Daily Rosine Solecki T, MD   30 mg at 08/18/20 1006  . valproic acid (DEPAKENE) 250 MG/5ML solution 500 mg  500 mg Oral TID Sharman Cheek, MD   500 mg at 08/23/20 6712   Current Outpatient Medications  Medication Sig Dispense Refill  . ARIPiprazole ER (ABILIFY MAINTENA) 400 MG SRER injection Inject 2 mLs (400 mg total) into the muscle every 28 (twenty-eight) days. 1 each 1  . divalproex (DEPAKOTE ER) 500 MG 24 hr tablet Take 1,000 mg by mouth daily.    . traZODone (DESYREL) 100 MG tablet Take 1 tablet (100 mg total) by mouth at bedtime as needed for sleep. 30 tablet 1    Musculoskeletal: Strength & Muscle Tone: within normal limits Gait & Station: normal Patient leans: N/A  Psychiatric Specialty Exam: Physical Exam Vitals and nursing note reviewed.  Constitutional:      Appearance: He is well-developed and well-nourished.  HENT:     Head: Normocephalic and atraumatic.  Eyes:     Conjunctiva/sclera: Conjunctivae normal.     Pupils: Pupils are equal, round, and reactive to light.  Cardiovascular:     Heart sounds: Normal heart sounds.  Pulmonary:     Effort: Pulmonary effort is normal.  Abdominal:     Palpations: Abdomen is soft.  Musculoskeletal:        General: Normal range of motion.     Cervical back: Normal range of motion.  Skin:    General: Skin is warm and dry.   Neurological:     General: No focal deficit present.     Mental Status: He is alert.  Psychiatric:        Attention and Perception: He is inattentive.        Mood and Affect: Affect is labile, angry and inappropriate.        Speech: Speech is rapid and pressured and tangential.        Behavior: Behavior is agitated.        Thought Content: Thought content is paranoid and delusional. Thought content does not include homicidal or suicidal ideation.  Cognition and Memory: Cognition is impaired. Memory is impaired.        Judgment: Judgment is impulsive.     Review of Systems  Constitutional: Negative.   HENT: Negative.   Eyes: Negative.   Respiratory: Negative.   Cardiovascular: Negative.   Gastrointestinal: Negative.   Musculoskeletal: Negative.   Skin: Negative.   Neurological: Negative.   Psychiatric/Behavioral: Positive for agitation and sleep disturbance. Negative for suicidal ideas. The patient is nervous/anxious and is hyperactive.     Blood pressure (!) 136/94, pulse 96, temperature 98.6 F (37 C), temperature source Oral, resp. rate 18, height 5\' 11"  (1.803 m), weight 88 kg, SpO2 100 %.Body mass index is 27.06 kg/m.  General Appearance: Disheveled  Eye Contact:  Good  Speech:  Pressured  Volume:  Increased  Mood:  Angry and Irritable  Affect:  Inappropriate and Labile  Thought Process:  Disorganized  Orientation:  Full (Time, Place, and Person)  Thought Content:  Paranoid Ideation, Rumination and Tangential  Suicidal Thoughts:  No  Homicidal Thoughts:  No  Memory:  Immediate;   Fair Recent;   Poor Remote;   Poor  Judgement:  Impaired  Insight:  Shallow  Psychomotor Activity:  Restlessness  Concentration:  Concentration: Poor  Recall:  Poor  Fund of Knowledge:  Good  Language:  Fair  Akathisia:  No  Handed:  Right  AIMS (if indicated):     Assets:  Desire for Improvement Housing Physical Health Resilience  ADL's:  Impaired  Cognition:  WNL   Sleep:        Treatment Plan Summary: Medication management and Plan Patient continues to be only partially cooperative with treatment and as noted above continues to have spells in which he will lose control of his mood and act out in a way that can be frightening although he has not assaulted anybody else.  Patient insists that if he were discharged he would go back to peacefully living on his own in the apartment that he says he is now inhabiting.  I could not quite put the story together but it sounds like he may have only recently transition to independent living.  I am trying to reach his mother to get some input from her.  Meanwhile he continues to be on orders for Depakote and olanzapine which have been safe and well tolerated and effective in the past.  Encourage patient to be cooperative with medication rather than letting himself get agitated to where he needs forced as needed medicine.  We have referred him to [facility] because we know he has a history of needing long hospitalization but the process is very slow.  Continue with medication management and daily reassessment while also hoping we can get him [facility] the state hospital.  Disposition: Recommend psychiatric Inpatient admission when medically cleared. Supportive therapy provided about ongoing stressors. Discussed crisis plan, support from social network, calling 911, coming to the Emergency Department, and calling Suicide Hotline.  [facility], MD 08/23/2020 12:44 PM

## 2020-08-23 NOTE — ED Provider Notes (Signed)
Emergency Medicine Observation Re-evaluation Note  Jackson [name] is a 35 y.o. male, seen on rounds today.  Pt initially presented to the ED for complaints of Psychiatric Evaluation Currently, the patient is sleeping comfortably.  Physical Exam  BP (!) 128/97 (BP Location: Right Arm)   Pulse 90   Temp 98.8 F (37.1 C)   Resp 18   Ht 5\' 11"  (1.803 m)   Wt 88 kg   SpO2 100%   BMI 27.06 kg/m  Physical Exam Constitutional:      Appearance: He is not ill-appearing or toxic-appearing.  HENT:     Head: Atraumatic.  Cardiovascular:     Comments: Well perfused Pulmonary:     Effort: Pulmonary effort is normal.  Abdominal:     General: There is no distension.  Musculoskeletal:        General: No deformity.  Skin:    General: Skin is warm and dry.     Findings: No rash.  Neurological:     General: No focal deficit present.     Cranial Nerves: No cranial nerve deficit.      ED Course / MDM  EKG:    I have reviewed the labs performed to date as well as medications administered while in observation.  Recent changes in the last 24 hours include none.  Plan  Current plan is for inpatient psychiatric care. Patient is under full IVC at this time.   , MD 08/23/20 [phone]

## 2020-08-23 NOTE — ED Notes (Signed)
Hourly rounding reveals patient in room. No complaints, stable, in no acute distress. Q15 minute rounds and monitoring via Security Cameras to continue. 

## 2020-08-23 NOTE — BH Assessment (Signed)
This writer contacted [facility] and spoke [phone]) who reported pt remains on the wait list.

## 2020-08-23 NOTE — BH Assessment (Signed)
This Clinical research associate contacted [facility] and spoke with Charlynne Pander [phone]) who reported pt remains on the wait list.

## 2020-08-23 NOTE — ED Notes (Signed)
Report to include Situation, Background, Assessment, and Recommendations received from Jeannette RN. Patient alert and oriented, warm and dry, in no acute distress. Patient denies SI, HI, AVH and pain. Patient made aware of Q15 minute rounds and security cameras for their safety. Patient instructed to come to me with needs or concerns.  

## 2020-08-23 NOTE — ED Notes (Signed)
IVC wait list at [facility]

## 2020-08-23 NOTE — ED Notes (Signed)
Patient c/o about being cold and requested to be moved back to his old room. Patient moved to room 1. Patient provided clean clothes and hygiene supplies, patient showered and went into his room to lay down.

## 2020-08-23 NOTE — ED Notes (Signed)
Patient with explosive behavior after being asked to return the phone. Patient provided phone privileges during phone hours, patient calling 911 to c/o about being held against his rights. Patient asked to return phone and he declined returning phone, phone was shut down, patient angry banging on glass door with a book, and banging his head on glass window, screaming and threatening staff, dr. Toni Amend in to attempt to de-escalate patient.

## 2020-08-24 NOTE — ED Notes (Signed)
Pt sleepy but responds when spoken to. Calm, no complaints.

## 2020-08-24 NOTE — ED Notes (Signed)
Pt awake, provided with snacks and drinks per request. Pt very vocal but calm about his unhappiness with situation. Reports he has issues with his mother who is his legal guardian. Pt states that he is unhappy she wants him to call her mom and not her first name, states she wants him to call her husband dad, who is not his father. Pt reports that his mother told him to not call her anymore. Pt asked to change rooms in the ED and then states that he is unhappy and wants to stay where he is, pt educated on plan of care and need for room change. Pt did change subject many of times but finally agrees. Pt is cooperative and calm at this time but is easily agitated. Room changed to room 6 now, pt items in room cleared and pt took extra blankets for comfort. To continue to monitor.

## 2020-08-24 NOTE — ED Notes (Signed)
IVC  ON  [facility]  WAITLIST 

## 2020-08-24 NOTE — ED Notes (Signed)
Hourly rounding reveals patient in room. No complaints, stable, in no acute distress. Q15 minute rounds and monitoring via Security Cameras to continue. 

## 2020-08-24 NOTE — ED Notes (Addendum)
Pt given food tray x 2.  Pt awake, calm

## 2020-08-24 NOTE — ED Notes (Signed)
Pt laying in bed at this time. calm

## 2020-08-24 NOTE — ED Notes (Signed)
Went to pts room to assess after meds were given. Pt awake, calm, laying on bed.

## 2020-08-24 NOTE — ED Notes (Signed)
Hourly rounding completed at this time, patient currently awake and speaking with staff in dayroom. No complaints, stable, and in no acute distress. Q15 minute rounds and monitoring via [employer] to continue.

## 2020-08-24 NOTE — ED Notes (Signed)
Hourly rounding completed at this time, patient currently awake in room. No complaints, stable, and in no acute distress. Q15 minute rounds and monitoring via Security Cameras to continue. 

## 2020-08-24 NOTE — ED Notes (Signed)
Hourly rounding completed at this time, patient currently asleep in room. No complaints, stable, and in no acute distress. Q15 minute rounds and monitoring via Security Cameras to continue. 

## 2020-08-24 NOTE — ED Notes (Signed)
Pt asleep. Pt observed turning over, moving in bed.

## 2020-08-24 NOTE — ED Notes (Signed)
Report received from Florence, RN including  Situation, Background, Assessment, and Recommendations. Patient is asleep at this time. Room secure, video surveillance, rover with safety checks and security to remain.

## 2020-08-24 NOTE — ED Notes (Signed)
Pt loud and aggressive. At window pounding on glass threatening to hurt nurse, asking nurse to come outside and then we will see. Pt unable to control self. Pt upset because phone has been disconnected due to pt becoming upset, loud and punching wall when using it.

## 2020-08-24 NOTE — ED Notes (Signed)
VS not taken, Patient asleep. 

## 2020-08-24 NOTE — BH Assessment (Signed)
This writer contacted [facility] and spoke withKim([phone]) who reported pt remains on the wait list.

## 2020-08-24 NOTE — ED Notes (Signed)
Pt loud, aggressive sounding on phone. Pt asked to calm down with limited results. Pt stated that this, implying phone conversation, was his therapy. Pt asked again to calm down.

## 2020-08-24 NOTE — ED Notes (Addendum)
Attempted to obtain VS. Pt refused. Pt states he feels ok. Observed moe x 4, regular respirations, skin normal color. Pt sleepy but at baseline. Told pt I would try again when he gets up. Pt indicated that that he might not let me then.

## 2020-08-24 NOTE — ED Notes (Signed)
Pt remains asleep. Pt witnessed turning in bed numerous times. No s/s of acute issues.

## 2020-08-24 NOTE — ED Notes (Signed)
Pt allowed this RN and charge RN to give him IM medications. Pt not happy and spent a few minutes yelling and screaming at window, banging on window.

## 2020-08-25 DIAGNOSIS — F250 Schizoaffective disorder, bipolar type: Secondary | ICD-10-CM | POA: Diagnosis not present

## 2020-08-25 MED ORDER — HYDROXYZINE HCL 25 MG PO TABS
50.0000 mg | ORAL_TABLET | Freq: Three times a day (TID) | ORAL | Status: DC | PRN
  Administered 2020-08-25 – 2020-09-16 (×18): 50 mg via ORAL
  Filled 2020-08-25 (×18): qty 2

## 2020-08-25 NOTE — ED Notes (Signed)
Hourly rounding completed at this time, patient currently asleep in room. No complaints, stable, and in no acute distress. Q15 minute rounds and monitoring via Security Cameras to continue. 

## 2020-08-25 NOTE — ED Notes (Signed)
Hourly rounding reveals patient in room. No complaints, stable, in no acute distress. Q15 minute rounds and monitoring via Security Cameras to continue. 

## 2020-08-25 NOTE — ED Notes (Signed)
Pt given apple juice and icee.  Pt also has working remote for the TV.
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
## 2020-08-25 NOTE — ED Notes (Signed)
Pt knocking on window.  Security officer on unit to speak with patient.

## 2020-08-25 NOTE — ED Notes (Signed)
Pt agitated and wanting to be left alone.  Refuses VS at this time.

## 2020-08-25 NOTE — ED Notes (Signed)
Pt has better handled his emotions after speaking with Dr,. Mauer. Pt has remained calm and been polite to staff.

## 2020-08-25 NOTE — ED Notes (Signed)
IVC/pending placement/IVC paperwork to be renewed by 1:15 PM.

## 2020-08-25 NOTE — ED Provider Notes (Signed)
Emergency Medicine Observation Re-evaluation Note  Jackson [name] is a 35 y.o. male, seen on rounds today.  Pt initially presented to the ED for complaints of Psychiatric Evaluation  Currently, the patient is is no acute distress. Pt very upset for how long he has been here.   Physical Exam  Blood pressure 97/73, pulse 75, temperature 98.1 F (36.7 C), temperature source Oral, resp. rate 16, height 5\' 11"  (1.803 m), weight 88 kg, SpO2 100 %.  Physical Exam General: No apparent distress HEENT: moist mucous membranes CV: RRR Pulm: Normal WOB GI: soft and non tender MSK: no edema or cyanosis Neuro: face symmetric, moving all extremities     ED Course / MDM     I have reviewed the labs performed to date as well as medications administered while in observation.  Recent changes in the last 24 hours include   Plan   Current plan is to continue to wait for psych plan/placement if felt warranted  Patient is under full IVC at this time. Asked nurse to page psych to provide update for patient.    , MD 08/25/20 [phone]

## 2020-08-25 NOTE — ED Notes (Signed)
Pt agitated after his shower. VS can not be taken at this time.

## 2020-08-25 NOTE — ED Notes (Signed)
Pts mother called to speak with Dr. Rebecca Eaton.

## 2020-08-25 NOTE — ED Notes (Signed)
Dr. Rebecca Eaton with patient to make a phone call to his mother/guardian.  Pt is yelling and agitated, but not directing his anger at the doctor. Security is monitoring.

## 2020-08-25 NOTE — ED Notes (Addendum)
After speaking with Dr. Rebecca Eaton, pt calm and cooperative with VS.  Pt gathered his trash  for RN and was given ice water.

## 2020-08-25 NOTE — ED Notes (Signed)
Patient found inside the bathroom sleeping with a shower running. Patient said he is cold and the steam from hot water will warm him up. He was compliant with getting his vital signs taken. Patient redirected to come out of the bathroom and go to his room. Helped with making his bed and cleaning up the room. Provide him with warm food and blanket. Patient was thankful and he voiced that he felt suicidal and frustrated that his mother doesn't care. He asked for pen and paper to write. No issues. Will continue to monitor.

## 2020-08-25 NOTE — ED Notes (Signed)
Pt asleep at this time, unable to collect vitals. Will collect pt vitals once awake. 

## 2020-08-25 NOTE — ED Notes (Signed)
Pt allowed to take a shower. Pt brought his mattress in the bathroom and used an extra "door" to block the shower entrance  Dr. Viviano Simas and security told pt he must come out after 15 minutes.  Pt complied.

## 2020-08-25 NOTE — ED Notes (Signed)
Report to include Situation, Background, Assessment, and Recommendations received from Amy RN. Patient alert and oriented, warm and dry, in no acute distress. Patient denies SI, HI, AVH and pain. Patient made aware of Q15 minute rounds and security cameras for their safety. Patient instructed to come to me with needs or concerns.  
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
## 2020-08-25 NOTE — ED Notes (Signed)
Pt awake and knocking on the window of room 7.  Pt asking security what time it was.

## 2020-08-25 NOTE — ED Notes (Signed)
IVC renewed and pending placement  on [facility] wait list

## 2020-08-25 NOTE — ED Notes (Signed)
Pt given meal tray.

## 2020-08-26 MED ORDER — QUETIAPINE FUMARATE ER 50 MG PO TB24: 100.0000 mg | ORAL_TABLET | Freq: Every day | ORAL | Status: DC

## 2020-08-26 MED ORDER — QUETIAPINE FUMARATE ER 50 MG PO TB24
100.0000 mg | ORAL_TABLET | Freq: Every evening | ORAL | Status: DC | PRN
  Administered 2020-08-26 – 2020-09-01 (×6): 100 mg via ORAL
  Filled 2020-08-26 (×20): qty 2

## 2020-08-26 NOTE — ED Notes (Signed)
Hourly rounding reveals patient in room. No complaints, stable, in no acute distress. Q15 minute rounds and monitoring via Security Cameras to continue. 

## 2020-08-26 NOTE — ED Notes (Signed)
Pt given dinner tray, pt eating

## 2020-08-26 NOTE — ED Notes (Signed)
Patient is IVC pending placement 

## 2020-08-26 NOTE — ED Provider Notes (Signed)
Emergency Medicine Observation Re-evaluation Note  Jackson [name] is a 35 y.o. male, seen on rounds today.  Pt initially presented to the ED for complaints of Psychiatric Evaluation Currently, the patient is sitting in chair, calm  Physical Exam  BP 105/73 (BP Location: Left Arm)   Pulse 83   Temp 97.9 F (36.6 C) (Oral)   Resp 18   Ht 1.803 m (5\' 11" )   Wt 88 kg   SpO2 100%   BMI 27.06 kg/m  Physical Exam General: No complaints  Lungs: Normal respiratory rate, no increased work of breathing Psych: Calm, stable mood, appropriate today  ED Course / MDM  EKG:    I have reviewed the labs performed to date as well as medications administered while in observation.   Plan  Patient has been seen by Dr. of psychiatry today, she continues to recommend inpatient admission Patient is under full IVC at this time.   Rebecca Eaton, MD 08/26/20 [phone]

## 2020-08-26 NOTE — ED Notes (Signed)
Pt watching TV and speaking with another patient in dayroom.

## 2020-08-26 NOTE — ED Notes (Signed)
Pt given lunch tray.

## 2020-08-26 NOTE — ED Notes (Addendum)
Pt ambulated to bathroom. Pt cooperative with VS.

## 2020-08-26 NOTE — BH Assessment (Signed)
This Clinical research associate contacted [facility] and spoke with Asqayue([phone]) who reported pt remains on the wait list.

## 2020-08-26 NOTE — ED Notes (Signed)
Pt ambulated to bathroom and breakfast tray given.  Sitting in dayroom eating.

## 2020-08-26 NOTE — Consult Note (Signed)
[facility] Face-to-Face Psychiatry Consult   Reason for Consult: 35 year old man with schizoaffective disorder  Referring Physician: Katrinka Blazing Patient Identification: Jackson [name] MRN:  [id number] Principal Diagnosis: Schizoaffective disorder, bipolar type (HCC) Diagnosis:  Principal Problem:   Schizoaffective disorder, bipolar type (HCC)   Total Time spent with patient: 35 minutes  Subjective: "I am feeling better today.  I did take medicine last night."   HPI: Patient seen chart reviewed.  Per nursing report, the patient took medication last evening, and was able to be transitioned into the open bay of the emergency department behavioral health unit.  He has not exhibited any agitated behaviors and has been appropriately interacting with peers and staff.  Jackson [name] is a 35 y.o. male patient  with a history of schizoaffective disorder, bipolar type.  On assessment today, patient is calm and cooperative.  He is smiling and states that he is very thankful to be out of the isolation area and able to talk to people.  He states that he has a "new attitude" and continues to express concern about losing his condo.  Patient states that he did not sleep well despite taking a medicine last night.  He again states that he does not want to take Depakote.  He is agreeable to trying Seroquel.  He is denying any suicidal or homicidal ideation.  He is denying any auditory or visual hallucinations.  He has been writing lyrics for rap songs, but is not hyperverbal with grandiosity as he had been yesterday.  He is not exhibiting anger or being perseverative about his mother being against him.  He remains hopeful that he will be able to discharge at some point back into an independent living arrangement, ideally in his current condominium.  Past Psychiatric History: Patient has a long history of schizoaffective disorder with episodes of mania.  During those episodes he will engage in behavior that gets himself in a  lot of trouble out in the community.  To some extent the fact that he is obviously quite intelligent probably works against him as he then gets involved in all sorts of activities when he is manic which provides more opportunity for him to transgress against social norms.  In the past it has taken a long time to get him stable on medicine.  Collateral from mother on 08/25/2020: Reviewed patient's history of hospitalization and brought in by 2 years, group home placement, eviction from independent living 04/2019, and possibility of eviction from his current residence.   Risk to Self:  Yes Risk to Others:  Yes Prior Inpatient Therapy:  Yes, including a 2-year hospitalization at [facility] Prior Outpatient Therapy:  Yes  Past Medical History:  Past Medical History:  Diagnosis Date  . Bipolar 1 disorder (HCC)   . Schizoaffective disorder (HCC)    History reviewed. No pertinent surgical history. Family History: History reviewed. No pertinent family history.   Family Psychiatric  History: See previous notes  Social History:  Social History   Substance and Sexual Activity  Alcohol Use Not Currently     Social History   Substance and Sexual Activity  Drug Use Not Currently    Social History   Socioeconomic History  . Marital status: Single    Spouse name: Not on file  . Number of children: Not on file  . Years of education: Not on file  . Highest education level: Not on file  Occupational History  . Not on file  Tobacco Use  . Smoking status: Light Tobacco  Smoker    Packs/day: 0.25    Types: Cigarettes  . Smokeless tobacco: Never Used  Vaping Use  . Vaping Use: Never used  Substance and Sexual Activity  . Alcohol use: Not Currently  . Drug use: Not Currently  . Sexual activity: Not on file  Other Topics Concern  . Not on file  Social History Narrative  . Not on file   Social Determinants of Health   Financial Resource Strain: Not on file  Food Insecurity: Not on file   Transportation Needs: Not on file  Physical Activity: Not on file  Stress: Not on file  Social Connections: Not on file   Additional Social History:    Allergies:  No Known Allergies  Labs: No results found for this or any previous visit (from the past 48 hour(s)).  Current Facility-Administered Medications  Medication Dose Route Frequency Provider Last Rate Last Admin  . diphenhydrAMINE (BENADRYL) injection 50 mg  50 mg Intramuscular Q6H PRN [name], [name], MD   50 mg at 08/24/20 1000  . haloperidol lactate (HALDOL) injection 5 mg  5 mg Intramuscular Q6H PRN [name], [name], MD   5 mg at 08/24/20 1000  . hydrOXYzine (ATARAX/VISTARIL) tablet 50 mg  50 mg Oral TID PRN Sharman Cheek, MD   50 mg at 08/25/20 2227  . LORazepam (ATIVAN) tablet 2 mg  2 mg Oral Q4H PRN [name], [name], MD   2 mg at 08/25/20 2027   Or  . LORazepam (ATIVAN) injection 2 mg  2 mg Intramuscular Q4H PRN [name], [name], MD   2 mg at 08/24/20 1000  . OLANZapine (ZYPREXA) injection 30 mg  30 mg Intramuscular QHS PRN [name], [name], MD      . OLANZapine zydis (ZYPREXA) disintegrating tablet 30 mg  30 mg Oral Daily [name], [name], MD   30 mg at 08/18/20 1006  . valproic acid (DEPAKENE) 250 MG/5ML solution 500 mg  500 mg Oral TID Sharman Cheek, MD   500 mg at 08/25/20 2027   Current Outpatient Medications  Medication Sig Dispense Refill  . ARIPiprazole ER (ABILIFY MAINTENA) 400 MG SRER injection Inject 2 mLs (400 mg total) into the muscle every 28 (twenty-eight) days. 1 each 1  . divalproex (DEPAKOTE ER) 500 MG 24 hr tablet Take 1,000 mg by mouth daily.    . traZODone (DESYREL) 100 MG tablet Take 1 tablet (100 mg total) by mouth at bedtime as needed for sleep. 30 tablet 1    Musculoskeletal: Strength & Muscle Tone: within normal limits Gait & Station: normal Patient leans: N/A   Psychiatric Specialty Exam: Physical Exam Vitals and nursing note reviewed.  Constitutional:      Appearance: Normal  appearance.  HENT:     Head: Normocephalic and atraumatic.     Nose: Nose normal.  Eyes:     Extraocular Movements: Extraocular movements intact.  Cardiovascular:     Rate and Rhythm: Normal rate.  Pulmonary:     Effort: Pulmonary effort is normal. No respiratory distress.     Breath sounds: No wheezing.  Musculoskeletal:        General: Normal range of motion.     Cervical back: Normal range of motion.  Neurological:     General: No focal deficit present.     Mental Status: He is alert and oriented to person, place, and time.     Review of Systems  Constitutional: Positive for appetite change (increased).  Respiratory: Negative.   Cardiovascular:  Negative.   Gastrointestinal: Negative.   Neurological: Negative.   Psychiatric/Behavioral: Positive for sleep disturbance. Negative for agitation, behavioral problems, confusion, decreased concentration, dysphoric mood, hallucinations, self-injury and suicidal ideas. The patient is not nervous/anxious and is not hyperactive.     Blood pressure 105/73, pulse 83, temperature 97.9 F (36.6 C), temperature source Oral, resp. rate 18, height 5\' 11"  (1.803 m), weight 88 kg, SpO2 100 %.Body mass index is 27.06 kg/m.   General Appearance: Casual and Fairly Groomed  Eye Contact:  Good  Speech:  Normal Rate  Volume:  Normal  Mood:  Euthymic  Affect:  Congruent  Thought Process:  Coherent and Descriptions of Associations: Tangential  Orientation:  Full (Time, Place, and Person)  Thought Content:  Rumination, Tangential and Denies hallucinations  Suicidal Thoughts:  No  Homicidal Thoughts:  No  Memory:  Immediate;   Fair Recent;   Poor Remote;   Poor  Judgement:  Impaired  Insight:  Shallow  Psychomotor Activity:  Normal  Concentration:  Concentration: Fair and Attention Span: Fair  Recall:  of Knowledge:  Good  Language:  Good  Akathisia:  No  Handed:  Right  AIMS (if indicated):     Assets:  Desire for  Improvement Housing Physical Health Resilience  ADL's:  Impaired  Cognition:  WNL  Sleep:   poor     Treatment Plan Summary: Daily contact with patient to assess and evaluate symptoms and progress in treatment and Medication management  Patient has been not taking prescribed medication since 08/18/2020. He continues to be on orders for Depakote and olanzapine which have been safe and well tolerated and effective in the past.   Last injectable medication for agitated behavior was 1/21 at 10 AM.  He did take hydroxyzine and Ativan last night, but reports they did not help him with sleep.  He is open to a trial of Seroquel.  He states he will continue to refuse Depakote. Patient has not exhibited agitated behaviors today.  We will continue to monitor.  Continue to encourage cooperation with medication.  [facility].  Disposition: Recommend psychiatric Inpatient admission when medically cleared. Supportive therapy provided about ongoing stressors.  2/21, MD 08/26/2020 1:48 PM

## 2020-08-26 NOTE — ED Notes (Signed)
VS not taken, Patient asleep. 

## 2020-08-26 NOTE — ED Notes (Signed)
Report to include Situation, Background, Assessment, and Recommendations received from Amy RN. Patient alert and oriented, warm and dry, in no acute distress. Patient denies SI, HI, AVH and pain. Patient made aware of Q15 minute rounds and security cameras for their safety. Patient instructed to come to me with needs or concerns.  

## 2020-08-26 NOTE — ED Notes (Signed)
Pt showering.  Clean scrubs socks underwear provided.

## 2020-08-26 NOTE — ED Notes (Signed)
Pt out of shower, returned all shower equipment.  Resting quietly back in bed

## 2020-08-26 NOTE — ED Notes (Signed)
Patient is moved to Room 1. No issues.

## 2020-08-27 MED ORDER — ACETAMINOPHEN 500 MG PO TABS
1000.0000 mg | ORAL_TABLET | Freq: Once | ORAL | Status: AC
  Administered 2020-08-27: 1000 mg via ORAL
  Filled 2020-08-27: qty 2

## 2020-08-27 NOTE — ED Notes (Signed)
Patient ate 100% of supper and beverage.  

## 2020-08-27 NOTE — ED Notes (Signed)
Hourly rounding reveals patient in room. No complaints, stable, in no acute distress. Q15 minute rounds and monitoring via Security Cameras to continue. 

## 2020-08-27 NOTE — BH Assessment (Signed)
This Clinical research associate contacted [facility] and spoke with [phone]) who reported pt remains on the wait list

## 2020-08-27 NOTE — ED Notes (Signed)
Patient ate 100% of breakfast and beverage.  

## 2020-08-27 NOTE — ED Notes (Signed)
Patient is in the shower.

## 2020-08-27 NOTE — ED Notes (Signed)
IVC pending placement 

## 2020-08-27 NOTE — ED Notes (Signed)
VS not taken, patient asleep 
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
## 2020-08-27 NOTE — ED Notes (Signed)
Patient c/o having neck pain that radiate to his spine and requested for patch and to get an x-ray. Notified EDP.

## 2020-08-27 NOTE — ED Notes (Signed)
EDP with the patient at the bedside.

## 2020-08-27 NOTE — ED Notes (Signed)
Patient received breakfast tray, He is calm at this time, will continue to monitor.

## 2020-08-27 NOTE — ED Notes (Signed)
Patient came to door, complaining that He was nervous and needed to be able to sleep, Nurse did administer ativan po, He was polite, will continue to monitor, camera surveillance in progress for safety.

## 2020-08-27 NOTE — ED Notes (Signed)
Report to include situation, background, assessment and recommendations from [facility]. Patient sleeping, respirations regular and unlabored. Q15 minute rounds and security camera observation to continue.

## 2020-08-28 NOTE — ED Notes (Signed)
Gave patient lunch tray with sprite.

## 2020-08-28 NOTE — ED Notes (Signed)
Hourly rounding reveals patient in room. No complaints, stable, in no acute distress. Q15 minute rounds and monitoring via Security Cameras to continue. 

## 2020-08-28 NOTE — ED Notes (Signed)
IVC  ON  [facility]  WAITLIST 

## 2020-08-28 NOTE — ED Notes (Signed)
Patient awake and alert.  Apologizing for earlier behavior, states "I really didn't raise my voice or anything, it was just my passion".  Provided feedback to patient that prior to medication, voice was raised and very loud.  Requesting to speak with [facility] r/t the process of commitment and care.  Charge nurse notified.   Patient speaking in normal tones at this time.  He is calm and cooperative.

## 2020-08-28 NOTE — ED Notes (Addendum)
Patient requested to use the phone.  Informed of new order r/t phone use -- that use was restricted to once a day, call to be dialed by hospital staff and under direct supervision of hospital staff.  Patient verbalized understanding.  Phone dialed by Security, patient states phone call did not go through.  Patient returned to room.

## 2020-08-28 NOTE — ED Notes (Signed)
Pt requested pt rights paper work from the website and and also to talk to the chaplin.  Tech  Paged the chaplin and talked to [place] over the phone and he was willing to come and talk to Xxavier. Chaplin arrived around 10am and had a 35-45 min conversation with Sulo. Staff thought that this was going in a positive direction.  Tech printed off the pt rights sheet off the [employer] website. Pt then ended up taking it to another level with the pt rights information. Staff told pt that some of his pt rights were taken away when he became IVCd. Pt then began yelling through the glass. Yelling and spitting toward the glass as he yelled derogatory comments to the RN [name].  He also asked to speak to the "Head RN". Herbert Seta has arrived to unit and [name] is preparing medication.

## 2020-08-28 NOTE — ED Notes (Signed)
Patient is awake and alert.  Ambulating independently to BR.  Calm and cooperative at this time.  Sprite given to patient.

## 2020-08-28 NOTE — BH Assessment (Signed)
TTS made contact with [facility] [phone]) and spoke to [place] who confirmed pt to be on their waitlist

## 2020-08-28 NOTE — ED Provider Notes (Signed)
Emergency Medicine Observation Re-evaluation Note  Jackson [name] is a 35 y.o. male, seen on rounds today.   Physical Exam  BP 126/85 (BP Location: Right Arm)   Pulse 73   Temp 98.6 F (37 C) (Oral)   Resp 18   Ht 5\' 11"  (1.803 m)   Wt 88 kg   SpO2 98%   BMI 27.06 kg/m  Physical Exam General: Patient sitting in hallway talking to other patients Lungs: Patient in no respiratory distress Psych: Patient is not combative  ED Course / MDM  EKG:    Plan  Patient is under IVC awaiting psych disposition.   , MD 08/28/20 0830

## 2020-08-28 NOTE — ED Notes (Signed)
Patient offered shower, states he will take one later today.

## 2020-08-28 NOTE — ED Notes (Signed)
Snack and beverage given. 

## 2020-08-28 NOTE — ED Notes (Signed)
Dinner tray given to patient with apple juice and orange juice.

## 2020-08-28 NOTE — ED Notes (Signed)
Popsicle given to patient.

## 2020-08-28 NOTE — ED Notes (Signed)
Patient behavior became agitated and escalated rapidly.  Patient not responding to verbal de-escalation.  Charge nurse notified.  Security notified.  Dr. Toni Amend into BHU to assess patient.  Patient medicated per MAR.  The [facility] Sheriff duty responded as well and is currently standing at patient's door while patient is in room.

## 2020-08-28 NOTE — BH Assessment (Signed)
Late entry- TTS contacted [facility]- [phone] this morning and spoke to [place] who confirmed pt to be on the waitlist

## 2020-08-28 NOTE — ED Notes (Signed)
VS not taken, patient asleep 

## 2020-08-28 NOTE — ED Notes (Signed)
Report to include Situation, Background, Assessment, and Recommendations received from Jane RN. Patient alert and oriented, warm and dry, in no acute distress. Patient denies SI, HI, AVH and pain. Patient made aware of Q15 minute rounds and security cameras for their safety. Patient instructed to come to me with needs or concerns.  

## 2020-08-29 NOTE — BH Assessment (Signed)
This writer contacted [facility] and spoke withTara([phone]) who reported pt remains on the wait list.

## 2020-08-29 NOTE — ED Notes (Signed)
Hourly rounding reveals patient in room. No complaints, stable, in no acute distress. Q15 minute rounds and monitoring via Security Cameras to continue. 

## 2020-08-29 NOTE — ED Provider Notes (Signed)
Emergency Medicine Observation Re-evaluation Note  Jackson [name] is a 35 y.o. male, seen on rounds today.  Pt initially presented to the ED for complaints of Psychiatric Evaluation Currently, the patient is resting.  Physical Exam  BP (!) 133/96 (BP Location: Left Arm)   Pulse 74   Temp 97.7 F (36.5 C) (Oral)   Resp 16   Ht 5\' 11"  (1.803 m)   Wt 88 kg   SpO2 100%   BMI 27.06 kg/m  Physical Exam General: nad Cardiac: well perfused Lungs: even and unlabored Psych: calm currently  ED Course / MDM  EKG:    I have reviewed the labs performed to date as well as medications administered while in observation.  Recent changes in the last 24 hours include none.  Plan  Current plan is for psych management. Patient is under full IVC at this time.   , MD 08/29/20 [phone]

## 2020-08-29 NOTE — ED Notes (Signed)

## 2020-08-29 NOTE — ED Notes (Signed)
VS not taken, Patient asleep. 

## 2020-08-29 NOTE — ED Notes (Signed)
Pt in dayroom.  Lunch served with drink.  Pt cooperative

## 2020-08-30 NOTE — ED Notes (Signed)
Patient in day room speaking with TTS, requesting information on when is he getting out of here.

## 2020-08-30 NOTE — ED Notes (Signed)
Patient requesting medications to help him calm down. Patient states he is starting to "get in his feelings." Patient asking for all his medications. Writer advised patient he could receive IM medications. Patient was agreeable to IM medications. Writer gave patient PRN medications. Security standing by for safety.

## 2020-08-30 NOTE — ED Notes (Signed)
IVC  ON  [facility]  WAITLIST

## 2020-08-30 NOTE — ED Notes (Signed)
Hourly rounding completed at this time, patient currently asleep in room. No complaints, stable, and in no acute distress. Q15 minute rounds and monitoring via Security Cameras to continue. 

## 2020-08-30 NOTE — ED Notes (Signed)
Report received from [place] including  Situation, Background, Assessment, and Recommendations. Patient alert and oriented, warm and dry, in no acute distress. Patient denies SI, HI, AVH and pain. Patient made aware of Q15 minute rounds and security cameras for their safety. Patient instructed to come to this nurse with needs or concerns. Pt has just finished showering now, pt out and provides nurse with all items from shower. Pt wearing clean scrubs, brushes teeth and provides tooth brush and paste.

## 2020-08-30 NOTE — ED Provider Notes (Signed)
Emergency Medicine Observation Re-evaluation Note  Jackson [name] is a 35 y.o. male, seen on rounds today.  Pt initially presented to the ED for complaints of Psychiatric Evaluation  Currently, the patient is is no acute distress. Received PRN medication for him not feeling like he was calm earlier this AM   Physical Exam  Blood pressure 120/79, pulse 70, temperature 98.6 F (37 C), temperature source Oral, resp. rate 17, height 5\' 11"  (1.803 m), weight 88 kg, SpO2 99 %.  Physical Exam General: No apparent distress HEENT: moist mucous membranes CV: RRR Pulm: Normal WOB GI: soft and non tender MSK: no edema or cyanosis Neuro: face symmetric, moving all extremities     ED Course / MDM     I have reviewed the labs performed to date as well as medications administered while in observation.  Recent changes in the last 24 hours include PRN meds given   Plan   Current plan is to continue to wait for psych plan/placement if felt warranted  Patient is under full IVC at this time.   , MD 08/30/20 [phone]

## 2020-08-30 NOTE — ED Notes (Signed)
Hourly rounding completed at this time, patient currently awake in room. No complaints, stable, and in no acute distress. Q15 minute rounds and monitoring via Security Cameras to continue. 

## 2020-08-30 NOTE — BH Assessment (Signed)
TTS received call from [facility]) stating patient is still on wait list.

## 2020-08-30 NOTE — ED Notes (Signed)
Hourly rounding completed at this time, patient currently awake in dayroom. No complaints, stable, and in no acute distress. Q15 minute rounds and monitoring via Security Cameras to continue. 

## 2020-08-30 NOTE — ED Notes (Signed)
Dinner provided.

## 2020-08-30 NOTE — ED Notes (Signed)
Pt provided with snack and drink at this time ?

## 2020-08-30 NOTE — ED Notes (Signed)
Patient received single portion meal for his dinner. Dietary called another meal patient has order for double portion meals 3 times a day.

## 2020-08-30 NOTE — ED Notes (Signed)
Pt requests night medications now, complains of anxiety, administered pt PRN with night meds

## 2020-08-31 NOTE — ED Notes (Signed)
Patient asked if he could call his mother due to not being able to talk to her due to her work schedule. Patient currently on phone with someone and getting upset during phone call.

## 2020-08-31 NOTE — ED Notes (Signed)
Hourly rounding completed at this time, patient currently asleep in room. No complaints, stable, and in no acute distress. Q15 minute rounds and monitoring via Security Cameras to continue. 

## 2020-08-31 NOTE — ED Notes (Signed)
He is lying in bed with his eyes closed  VS to be obtained when he awakens

## 2020-08-31 NOTE — ED Provider Notes (Signed)
Emergency Medicine Observation Re-evaluation Note  Jackson [name] is a 35 y.o. male, seen on rounds today.  Pt initially presented to the ED for complaints of Psychiatric Evaluation Currently, the patient is calm, resting, in NAD.  Physical Exam  BP 117/82   Pulse 78   Temp 98.4 F (36.9 C) (Oral)   Resp 16   Ht 5\' 11"  (1.803 m)   Wt 88 kg   SpO2 98%   BMI 27.06 kg/m  Physical Exam General: calm, resting Cardiac: well perfused Lungs: noraml WOB, in NAD Psych: calm, resting  ED Course / MDM  EKG:    I have reviewed the labs performed to date as well as medications administered while in observation.  Recent changes in the last 24 hours include none.  Plan  Current plan is for IVC, seeking psych placement. Patient is under full IVC at this time.   , MD 08/31/20 [phone]
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
## 2020-08-31 NOTE — ED Notes (Signed)
No am meds ordered at this time  Assessment completed  He denies pain   He is calm and cooperative  - talking to staff and other patients appropriately   He continues to await inpatient treatment  - he is on the [facility] waitlist

## 2020-08-31 NOTE — BH Assessment (Signed)
TTS made contact with [facility] [phone]) and confirmed pt to remain on the waitlist with Molly Maduro.

## 2020-08-31 NOTE — ED Notes (Signed)

## 2020-08-31 NOTE — ED Notes (Signed)
IVC/[facility] waitlist 

## 2020-08-31 NOTE — ED Notes (Signed)
Pt asleep at this time, unable to collect vitals. Will collect pt vitals once awake. 

## 2020-08-31 NOTE — ED Notes (Signed)
Pt given lunch tray.

## 2020-09-01 NOTE — ED Notes (Signed)
Unable to obtain vitals due to patient refusing. Will continue to monitor.

## 2020-09-01 NOTE — ED Notes (Signed)
Hourly rounding reveals patient in room. No complaints, stable, in no acute distress. Q15 minute rounds and monitoring via Security Cameras to continue. 

## 2020-09-01 NOTE — ED Notes (Signed)
Report to include Situation, Background, Assessment, and Recommendations received from Jeannette RN. Patient alert and oriented, warm and dry, in no acute distress. Patient denies SI, HI, AVH and pain. Patient made aware of Q15 minute rounds and security cameras for their safety. Patient instructed to come to me with needs or concerns.  

## 2020-09-01 NOTE — ED Notes (Signed)
Patient venting about him not be able to get [facility] evaluation. He feels that he is here "indefenately" and can't leave the hospital. He said " I haven't done any crime except breaking and entering. I should be able to go to court and present my case". Patient said that he needs snack and beverage.

## 2020-09-01 NOTE — ED Notes (Signed)
Snack and beverage given. 

## 2020-09-01 NOTE — ED Notes (Signed)
Patient laying on chairs in day room watching movies.

## 2020-09-01 NOTE — ED Provider Notes (Signed)
Emergency Medicine Observation Re-evaluation Note  Jackson [name] is a 35 y.o. male, seen on rounds today.  Pt initially presented to the ED for complaints of Psychiatric Evaluation Currently, the patient is calm cooperative, no complaints, besides wanting to go home.  Physical Exam  BP 132/84 (BP Location: Right Arm)   Pulse 72   Temp 98.6 F (37 C) (Oral)   Resp 17   Ht 5\' 11"  (1.803 m)   Wt 88 kg   SpO2 99%   BMI 27.06 kg/m  Physical Exam General: Patient is calm and cooperative.  Still has somewhat of a manic presentation with rapid speech. Cardiac: Regular rate and rhythm around 80 bpm. Lungs: Clear to auscultation bilaterally. Psych: Possibly slightly manic otherwise calm cooperative and pleasant.  ED Course / MDM  No new recent lab work.  Plan  Current plan is for patient to be placed at [facility], remains on a wait list.  Patient did request to speak to a [facility] as he is currently paying rent at his apartment that he is not living up and was hoping for help.  I have consulted social work for the patient. Patient is under full IVC at this time.   Child psychotherapist, MD 09/01/20 1535

## 2020-09-01 NOTE — BH Assessment (Signed)
TTS made contact with [facility] [phone]) and confirmed pt remains on the wait list per Molly Maduro.

## 2020-09-01 NOTE — ED Notes (Signed)
In day room conversing and entertaining  other patients

## 2020-09-01 NOTE — ED Notes (Signed)
In day room hanging out with other patients and engaging in conersation 

## 2020-09-02 NOTE — ED Notes (Signed)
Hourly rounding reveals patient in room. No complaints, stable, in no acute distress. Q15 minute rounds and monitoring via Security Cameras to continue. 

## 2020-09-02 NOTE — ED Notes (Signed)
He states  "I refuse everything - y'all ain't going to get me a second opinion - y'all ain't going to get the administrative coordinator to come and see me - then I am not doing what y'all need - no  I refuse all vital signs and I refuse any medicine.  Go get me somebody to get me out of here and then I will act right"

## 2020-09-02 NOTE — ED Notes (Signed)
VS not taken, patient is asleep. 

## 2020-09-02 NOTE — ED Provider Notes (Signed)
Emergency Medicine Observation Re-evaluation Note  Jackson [name] is a 35 y.o. male, seen on rounds today.  Pt initially presented to the ED for complaints of Psychiatric Evaluation Currently, the patient is calm and denies any acute complaints. .  Physical Exam  BP 119/76 (BP Location: Right Arm)   Pulse 83   Temp 98.7 F (37.1 C) (Oral)   Resp 18   Ht 5\' 11"  (1.803 m)   Wt 88 kg   SpO2 98%   BMI 27.06 kg/m  Physical Exam General: no acute distress, laughing and making jokes with security.  Cardiac: regular rate  Lungs: equal chest rise, no distress Psych: calm  ED Course / MDM  EKG:    I have reviewed the labs performed to date as well as medications administered while in observation.  Recent changes in the last 24 hours include none.  Plan  Current plan is for patient to be placed at [facility], remains on a wait list.  Patient did request to speak to a [facility] as he is currently paying rent at his apartment that he is not living up and was hoping for help.  I have consulted social work for the patient. Patient is under full IVC at this time.   Child psychotherapist, MD 09/02/20 [phone]

## 2020-09-02 NOTE — ED Notes (Signed)
He continues to refuse me to take his VS  He states  "no - I do not need anything - no you may not take my VS."

## 2020-09-02 NOTE — ED Notes (Signed)
Patient refused to take his HS medication because he said he peed on himself when he takes the medication. This Clinical research associate gave patient his 2 mg Ativan PRN in his drinks. No issues.

## 2020-09-02 NOTE — ED Notes (Signed)
Patient is IVC pending placement 

## 2020-09-02 NOTE — ED Notes (Signed)
Patient is sleeping in day room. No issues.

## 2020-09-02 NOTE — ED Notes (Signed)
Report to include Situation, Background, Assessment, and Recommendations received from Amy RN. Patient alert and oriented, warm and dry, in no acute distress. Patient denies SI, HI, AVH and pain. Patient made aware of Q15 minute rounds and security cameras for their safety. Patient instructed to come to me with needs or concerns.  

## 2020-09-02 NOTE — ED Notes (Signed)
Patient said his room feels cold. This Clinical research associate moved patient to another room. No issues.

## 2020-09-02 NOTE — ED Notes (Signed)
He continues to refuse to have his VS taken

## 2020-09-03 NOTE — ED Notes (Signed)
Hourly rounding reveals patient in room. No complaints, stable, in no acute distress. Q15 minute rounds and monitoring via Security Cameras to continue. 

## 2020-09-03 NOTE — BH Assessment (Signed)
This Clinical research associate contacted [facility] and spoke with Vincenza Hews  [phone]) and confirmed pt remains on the wait list

## 2020-09-03 NOTE — ED Notes (Signed)
Pt exited bathroom and continued to yell.

## 2020-09-03 NOTE — ED Notes (Signed)
Meal tray given 

## 2020-09-03 NOTE — ED Notes (Signed)
Report to include Situation, Background, Assessment, and Recommendations received from Amy RN. Patient alert and oriented, warm and dry, in no acute distress. Patient denies SI, HI, AVH and pain. Patient made aware of Q15 minute rounds and security cameras for their safety. Patient instructed to come to me with needs or concerns.  

## 2020-09-03 NOTE — ED Provider Notes (Incomplete)
Emergency Medicine Observation Re-evaluation Note  Jackson [name] is a 35 y.o. male, seen on rounds today.  Pt initially presented to the ED for complaints of Psychiatric Evaluation Currently, the patient is ***.  Physical Exam  BP 100/69 (BP Location: Right Arm)   Pulse 80   Temp 98.4 F (36.9 C) (Oral)   Resp 18   Ht 5\' 11"  (1.803 m)   Wt 88 kg   SpO2 100%   BMI 27.06 kg/m  Physical Exam General: *** Cardiac: *** Lungs: *** Psych: ***  ED Course / MDM  EKG:    I have reviewed the labs performed to date as well as medications administered while in observation.  Recent changes in the last 24 hours include ***.  Plan  Current plan is for ***. Patient {ACTION; IS/IS under full IVC at this time.

## 2020-09-03 NOTE — ED Notes (Signed)
VS not taken, patient asleep 

## 2020-09-03 NOTE — ED Notes (Signed)
Pt given grape juice 

## 2020-09-03 NOTE — ED Notes (Signed)
Pt sitting in dayroom speaking with another patient. Given juice.

## 2020-09-03 NOTE — ED Notes (Addendum)
Pt is in the dayroom with other patients.  Pt is dancing, singing and making jokes.

## 2020-09-03 NOTE — TOC Initial Note (Signed)
Transition of Care [facility]) - Initial/Assessment Note    Patient Details  Name: Kahil Plaia MRN: [id number] Date of Birth: 1986/01/20  Transition of Care [facility]) CM/SW Contact:    Marina Goodell Phone Number: [phone] 09/03/2020, 11:35 AM  Clinical Narrative:                  Pt currently IVC, pending placement at [facility], TTS following and assisting with placement.       Patient Goals and CMS Choice        Expected Discharge Plan and Services                                                Prior Living Arrangements/Services                       Activities of Daily Living      Permission Sought/Granted                  Emotional Assessment              Admission diagnosis:  IVC Patient Active Problem List   Diagnosis Date Noted  . Noncompliance 10/18/2018  . Schizoaffective disorder, bipolar type (HCC) 10/17/2018   PCP:  Oneita Hurt, No Pharmacy:   [facility] - [place], [place] - [address] [place] [address] Phone: [phone] Fax: [phone]  MED[facility] [address] [place], [place] - [address] [name] [address] Phone: [phone] Fax: [phone]     Social Determinants of Health (SDOH) Interventions    Readmission Risk Interventions No flowsheet data found.

## 2020-09-03 NOTE — ED Notes (Addendum)
Pt is agitated and refusing VS. This Clinical research associate is unsure why pt is now agitated.

## 2020-09-03 NOTE — ED Notes (Signed)
Patient talked to this Clinical research associate about his frustration and how he feels helpless. He said " my mother wants me to be here that why Dr. Laban Emperor don't let me leave". He said " I don't have schizoaffective disorder and don't have to take any medication". I have been too so many state psychiatric facilities. They put me on heavy drugs and I can't be able to think clearly. That's what my mother wants. She is my legal guardian and I am changing her. Patient has rapid pressured speech. He is speaking loudly at times. He said he is loud because he is black. This Clinical research associate actively listened and redirect patient. Patient asked for beverage and went to his room. No issues.

## 2020-09-03 NOTE — ED Notes (Signed)
Pt to nurse's station door wanting to speak with the nurses. Pt vented about his guardian, losing his apartment and having to go to a state hospital.  Pt expressed he needs someone to speak with daily.

## 2020-09-03 NOTE — ED Notes (Signed)
Pt in bathroom with door closed. Pt can be heard in the nurses station speaking loudly.  Pt is speaking about getting his "ass whooped" as a child for getting bad grades.

## 2020-09-04 DIAGNOSIS — F250 Schizoaffective disorder, bipolar type: Secondary | ICD-10-CM | POA: Diagnosis not present

## 2020-09-04 NOTE — ED Notes (Signed)
Pt is calm and sitting in the dayroom.

## 2020-09-04 NOTE — ED Notes (Signed)
Pt given the contact number of his ACT Team.

## 2020-09-04 NOTE — ED Notes (Signed)
Hourly rounding reveals patient in room. No complaints, stable, in no acute distress. Q15 minute rounds and monitoring via Security Cameras to continue. 

## 2020-09-04 NOTE — ED Notes (Signed)
Pt provided with snack at this time  

## 2020-09-04 NOTE — ED Notes (Signed)
VS not taken, Patient asleep. 

## 2020-09-04 NOTE — ED Notes (Signed)
Report received from Amy, RN including  Situation, Background, Assessment, and Recommendations. Patient alert and oriented, warm and dry, in no acute distress. Patient denies SI, HI, AVH and pain. Patient made aware of Q15 minute rounds and security cameras for their safety. Patient instructed to come to this nurse with needs or concerns. 

## 2020-09-04 NOTE — ED Notes (Addendum)
Pt yelling in the dayroom about not having any patient rights and being denied a Child psychotherapist.

## 2020-09-04 NOTE — ED Notes (Signed)
Pt remains in bed. Appears depressed, withdrawn.

## 2020-09-04 NOTE — ED Notes (Signed)
Meal tray given 

## 2020-09-04 NOTE — ED Notes (Addendum)
Pt coming to nurse's station door yelling, "Who the fuck is Jackson [name]?."   Pt is agitated and yelling about his mother calling him Jackson [name].  Pt is in dayroom yelling about his mother being his guardian.  Per patient, his mother makes fun of him and antagonizes him.

## 2020-09-04 NOTE — ED Notes (Signed)
Pt given crayons and paper. ° °

## 2020-09-04 NOTE — ED Notes (Signed)
Pt has spent most of the day in his room. Pt appears depressed.

## 2020-09-04 NOTE — ED Notes (Signed)
Pt given supplies to take a shower.  

## 2020-09-04 NOTE — ED Notes (Signed)
Pt states that he spoke to his mother today and that she was rude. Pt very disorganized and felt obligated to tell entire life story to this nurse. PT is upset that he feels he has no rights in here and he is trapped due to his mother and Dr. Toni Amend. PT believes his mother does not have his bed interest and that Dr. Toni Amend has connects with the [place]. PT is upset that he is still here and feels he will loose his apartment. Pt thinks he is ready for DC but doesn't feel he can get out of here by how he talks. Pt was tearful when conversation started

## 2020-09-04 NOTE — ED Notes (Signed)
Pt in day room. Breakfast tray given and vs assessed. No other needs found at this moment.

## 2020-09-04 NOTE — ED Notes (Signed)
Hourly rounding completed at this time, patient currently awake in dayroom. No complaints, stable, and in no acute distress. Q15 minute rounds and monitoring via Psychologist, counselling to continue.

## 2020-09-04 NOTE — ED Notes (Signed)
Hourly rounding completed at this time, patient currently awake in room. No complaints, stable, and in no acute distress. Q15 minute rounds and monitoring via Rover and Officer to continue. °

## 2020-09-04 NOTE — ED Notes (Signed)
IVC  [facility]  WAITLIST 

## 2020-09-04 NOTE — Consult Note (Signed)
[facility] Face-to-Face Psychiatry Consult   Reason for Consult: Follow-up for this 35 year old man with schizoaffective disorder Referring Physician: Cyril Loosen Patient Identification: Jackson [name] MRN:  [id number] Principal Diagnosis: Schizoaffective disorder, bipolar type (HCC) Diagnosis:  Principal Problem:   Schizoaffective disorder, bipolar type (HCC)   Total Time spent with patient: 30 minutes  Subjective:   Jackson [name] is a 35 y.o. male patient admitted with "I want to talk with you about what happened".  HPI: Patient seen for follow-up.  His behavior and presentation is labile and remarkably different at different times of the day.  Usually during the morning and most of the daytime he remains calm but typically around suppertime and through the evening he ramps up and will get extremely agitated.  Last night he was once again shouting at the top of his lungs seeming to be preaching at times.  Did not assaulting anyone but he was very disruptive and would not of been acceptable anywhere in any public situation.  When he gets like that he will get hostile and occasionally makes threatening statements without carrying through on it.  This despite apparent compliance with his medicine  Past Psychiatric History: History of recurrent episodes of very extreme mania that usually take months or more to stabilize  Risk to Self:   Risk to Others:   Prior Inpatient Therapy:   Prior Outpatient Therapy:    Past Medical History:  Past Medical History:  Diagnosis Date  . Bipolar 1 disorder (HCC)   . Schizoaffective disorder (HCC)    History reviewed. No pertinent surgical history. Family History: History reviewed. No pertinent family history. Family Psychiatric  History: See previous Social History:  Social History   Substance and Sexual Activity  Alcohol Use Not Currently     Social History   Substance and Sexual Activity  Drug Use Not Currently    Social History   Socioeconomic  History  . Marital status: Single    Spouse name: Not on file  . Number of children: Not on file  . Years of education: Not on file  . Highest education level: Not on file  Occupational History  . Not on file  Tobacco Use  . Smoking status: Light Tobacco Smoker    Packs/day: 0.25    Types: Cigarettes  . Smokeless tobacco: Never Used  Vaping Use  . Vaping Use: Never used  Substance and Sexual Activity  . Alcohol use: Not Currently  . Drug use: Not Currently  . Sexual activity: Not on file  Other Topics Concern  . Not on file  Social History Narrative  . Not on file   Social Determinants of Health   Financial Resource Strain: Not on file  Food Insecurity: Not on file  Transportation Needs: Not on file  Physical Activity: Not on file  Stress: Not on file  Social Connections: Not on file   Additional Social History:    Allergies:  No Known Allergies  Labs: No results found for this or any previous visit (from the past 48 hour(s)).  Current Facility-Administered Medications  Medication Dose Route Frequency Provider Last Rate Last Admin  . diphenhydrAMINE (BENADRYL) injection 50 mg  50 mg Intramuscular Q6H PRN [name], [name], MD   50 mg at 08/30/20 0545  . haloperidol lactate (HALDOL) injection 5 mg  5 mg Intramuscular Q6H PRN [name], [name], MD   5 mg at 08/30/20 0545  . hydrOXYzine (ATARAX/VISTARIL) tablet 50 mg  50 mg Oral TID PRN Sharman Cheek, MD  50 mg at 09/02/20 1240  . LORazepam (ATIVAN) tablet 2 mg  2 mg Oral Q4H PRN [name], [name], MD   2 mg at 09/02/20 2111   Or  . LORazepam (ATIVAN) injection 2 mg  2 mg Intramuscular Q4H PRN [name], [name], MD   2 mg at 08/30/20 0544  . OLANZapine (ZYPREXA) injection 30 mg  30 mg Intramuscular QHS PRN [name], [name], MD      . QUEtiapine (SEROQUEL XR) 24 hr tablet 100 mg  100 mg Oral QHS,MR X 1 Mariel Craft, MD   100 mg at 09/01/20 2135   Current Outpatient Medications  Medication Sig Dispense Refill  .  ARIPiprazole ER (ABILIFY MAINTENA) 400 MG SRER injection Inject 2 mLs (400 mg total) into the muscle every 28 (twenty-eight) days. 1 each 1  . divalproex (DEPAKOTE ER) 500 MG 24 hr tablet Take 1,000 mg by mouth daily.    . traZODone (DESYREL) 100 MG tablet Take 1 tablet (100 mg total) by mouth at bedtime as needed for sleep. 30 tablet 1    Musculoskeletal: Strength & Muscle Tone: within normal limits Gait & Station: normal Patient leans: N/A  Psychiatric Specialty Exam: Physical Exam Vitals and nursing note reviewed.  Constitutional:      Appearance: He is well-developed and well-nourished.  HENT:     Head: Normocephalic and atraumatic.  Eyes:     Conjunctiva/sclera: Conjunctivae normal.     Pupils: Pupils are equal, round, and reactive to light.  Cardiovascular:     Heart sounds: Normal heart sounds.  Pulmonary:     Effort: Pulmonary effort is normal.  Abdominal:     Palpations: Abdomen is soft.  Musculoskeletal:        General: Normal range of motion.     Cervical back: Normal range of motion.  Skin:    General: Skin is warm and dry.  Neurological:     General: No focal deficit present.     Mental Status: He is alert.  Psychiatric:        Attention and Perception: He is inattentive.        Mood and Affect: Affect is labile.        Speech: Speech is tangential.        Thought Content: Thought content is paranoid and delusional.        Judgment: Judgment is impulsive.     Review of Systems  Constitutional: Negative.   HENT: Negative.   Eyes: Negative.   Respiratory: Negative.   Cardiovascular: Negative.   Gastrointestinal: Negative.   Musculoskeletal: Negative.   Skin: Negative.   Neurological: Negative.   Psychiatric/Behavioral: Positive for confusion and sleep disturbance. The patient is nervous/anxious and is hyperactive.     Blood pressure 127/88, pulse 73, temperature 98.3 F (36.8 C), temperature source Oral, resp. rate 16, height 5\' 11"  (1.803 m), weight  88 kg, SpO2 98 %.Body mass index is 27.06 kg/m.  General Appearance: Casual  Eye Contact:  Fair  Speech:  Pressured  Volume:  Increased  Mood:  Angry and Irritable  Affect:  Inappropriate and Labile  Thought Process:  Disorganized  Orientation:  Full (Time, Place, and Person)  Thought Content:  Tangential  Suicidal Thoughts:  No  Homicidal Thoughts:  No  Memory:  Immediate;   Fair Recent;   Fair Remote;   Fair  Judgement:  Impaired  Insight:  Lacking  Psychomotor Activity:  Decreased  Concentration:  Concentration: Fair  Recall:  Fair  Fund of Knowledge:  Fair  Language:  Fair  Akathisia:  No  Handed:  Right  AIMS (if indicated):     Assets:  Desire for Improvement Housing Physical Health Social Support  ADL's:  Impaired  Cognition:  Impaired,  Mild  Sleep:        Treatment Plan Summary: Medication management and Plan Despite what appears at times to be improvement he continues to exhibit psychotic and manic symptoms on a daily basis.  Not unusual for him to become verbally threatening.  He has had multiple episodes of banging his head although he was not doing that yesterday as far as I know.  Patient continues to be on the [facility] wait list.  I have called and left a message for the director of [facility] asking for a call back to discuss the patient's case.  I am going to go ahead and order a Depakote level for tomorrow.  Disposition: Recommend psychiatric Inpatient admission when medically cleared.  Mordecai Rasmussen, MD 09/04/2020 3:03 PM

## 2020-09-04 NOTE — ED Notes (Signed)
Pt came to speak to nurse, pt tearful and crying, pt anxious and talkative. Feels he has nowhere to go and nobody to talk to, pt expresses his emotions again as earlier. Pt upset with mother and relationship, feels he is trapped her and has no way of "winning" Pt continues with pressured speech, making up stories of what is going on. Pt requires deescalating by this nurse and is able to relax. Agrees to take PRN for anxiety but still refuses scheduled med. Pt given drink and returns to room, sitting on side of bed with face in palms of hands.

## 2020-09-04 NOTE — ED Notes (Signed)
Hourly rounding completed at this time, patient currently awake in room. No complaints, stable, and in no acute distress. Q15 minute rounds and monitoring via Security Cameras to continue. 

## 2020-09-04 NOTE — ED Notes (Signed)
Pt given lotion.

## 2020-09-04 NOTE — ED Notes (Signed)
Engineer, materials in dayroom speaking with patient.

## 2020-09-04 NOTE — BH Assessment (Signed)
Confirmed with [facility]([phone]), patientremainson their Waitlist. 

## 2020-09-04 NOTE — ED Notes (Signed)
Pt given the phone.   

## 2020-09-04 NOTE — ED Notes (Signed)
Snack and beverage given. 

## 2020-09-05 ENCOUNTER — Emergency Department: Payer: No Typology Code available for payment source

## 2020-09-05 LAB — SARS CORONAVIRUS 2 BY RT PCR (HOSPITAL ORDER, PERFORMED IN ~~LOC~~ HOSPITAL LAB): SARS Coronavirus 2: NEGATIVE

## 2020-09-05 MED ORDER — DIVALPROEX SODIUM 500 MG PO DR TAB
750.0000 mg | DELAYED_RELEASE_TABLET | Freq: Two times a day (BID) | ORAL | Status: DC
  Administered 2020-09-05: 750 mg via ORAL
  Filled 2020-09-05 (×2): qty 1

## 2020-09-05 MED ORDER — OLANZAPINE 10 MG PO TBDP
20.0000 mg | ORAL_TABLET | Freq: Every day | ORAL | Status: DC
  Administered 2020-09-05 – 2020-09-16 (×7): 20 mg via ORAL
  Filled 2020-09-05 (×15): qty 2

## 2020-09-05 NOTE — ED Notes (Signed)
Hourly rounding completed at this time, patient currently asleep in room. No complaints, stable, and in no acute distress. Q15 minute rounds and monitoring via Security Cameras to continue. 

## 2020-09-05 NOTE — ED Notes (Signed)
Hourly rounding reveals patient in room. No complaints, stable, in no acute distress. Q15 minute rounds and monitoring via Security Cameras to continue. 

## 2020-09-05 NOTE — ED Notes (Signed)
Hourly rounding completed at this time, patient currently awake in dayroom.Speaking to this nurse. Q15 minute rounds and monitoring via [employer] to continue.

## 2020-09-05 NOTE — ED Notes (Addendum)
Prior to orders and interventions, pt came to desk. States that he is having hard time catching breath, denies chest pain. Pt requests lung auscultation, completed by this nurse. This nurse speaks to Dr. Elesa Massed who placed order. Orders completed, pt administered PRN as suggested by Dr. Elesa Massed for anxiety. Pt is in poor state of mind, pt rambling on about how his rights are not being kept, states he may have covid from other pts, reports that he is having heart attack and if he is then he wants to be a DNR with no procedures to insert items into person, states if he has COVID and only 6 months to live then he needs to go to [place] so he can have a medical suicide completed. Pt then states that he has been having difficulty with ejaculation and masturbation along with feeling alone. Pt is dominating in conversation and at times seems to talk just to talk. Pt speaks over staff in attempt to continue telling his story that is fabricated as to be speaking about situations with this nurse he does not recall being with this nurse. Pt is unable to be redirected when on certain topic and wants staff advice but does not want to listen to staff advice. Pt has serious concrete thinking that he is competent to take care of his own self and be discharged and feels he is being denied his patient and constitutional rights.

## 2020-09-05 NOTE — ED Notes (Signed)
Report to include Situation, Background, Assessment, and Recommendations received from Amy RN. Patient alert and oriented, warm and dry, in no acute distress. Patient denies SI, HI, AVH and pain. Patient made aware of Q15 minute rounds and security cameras for their safety. Patient instructed to come to me with needs or concerns.  

## 2020-09-05 NOTE — ED Provider Notes (Signed)
Emergency Medicine Observation Re-evaluation Note  Jackson [name] is a 35 y.o. male, seen on rounds today.  Pt initially presented to the ED for complaints of Psychiatric Evaluation Currently, the patient is anxious and complaining of feeling like he cannot catch his breath.  Able to be redirected.Marland Kitchen  Physical Exam  BP 123/85   Pulse 67   Temp 98.2 F (36.8 C) (Oral)   Resp 18   Ht 5\' 11"  (1.803 m)   Wt 88 kg   SpO2 100%   BMI 27.06 kg/m  Physical Exam Gen: No acute distress  Resp: Normal rise and fall of chest, lungs clear to auscultation Neuro: Moving all four extremities Psych: Resting currently, calm and cooperative when awake    ED Course / MDM  EKG:EKG Interpretation  Date/Time:  Wednesday September 05 2020 03:40:56 EST Ventricular Rate:  70 PR Interval:  246 QRS Duration: 88 QT Interval:  352 QTC Calculation: 380 R Axis:   35 Text Interpretation: Sinus rhythm with 1st degree A-V block Otherwise normal ECG No significant change since last tracing Confirmed by Mel Langan, [birth year]2006 [phone]) on 09/05/2020 4:02:14 AM    I have reviewed the labs performed to date as well as medications administered while in observation.  Recent changes in the last 24 hours include complaints of shortness of breath overnight.  Plan  Patient plan planing of shortness of breath overnight.  Repeat EKG, chest x-ray and Covid swab obtained.  Denying any chest pain.  Repeat vital signs within normal limits. No hypoxia. Told nursing staff that he wore a T-shirt of his brother's when he was younger that was too tight for him and caused his chest to cave in and he thinks this is what is causing his difficulty breathing today.  I feel his symptoms are likely rooted in his psychiatric illness.  He did agree to take oral Ativan for anxiety and increasing agitation.  EKG shows first-degree AV block but no ischemia, arrhythmia or other interval abnormality.  Chest x-ray clear. Repeat Covid test negative.  Current  plan is for inpatient psychiatric treatment. Patient is under full IVC at this time.   Bryson Gavia, 11/03/2020, DO 09/05/20 [phone]

## 2020-09-05 NOTE — ED Notes (Signed)
Snack and beverage given. 

## 2020-09-05 NOTE — ED Notes (Signed)
Hourly rounding completed at this time, patient currently awake in room. No complaints, stable, and in no acute distress. Q15 minute rounds and monitoring via Security Cameras to continue. 

## 2020-09-05 NOTE — BH Assessment (Signed)
Confirmed with [facility] ([phone]), patient remain on their Waitlist.

## 2020-09-05 NOTE — ED Notes (Signed)
Pt remains anxious and not relaxed, continues to talk and not be able to get off of topics that causes him to escalate. Pt then states he wants IM medications that will make him sleep. Pt is not agitated and does not justify IM medications, this nurse verified this decision with Annice Pih, NP who agrees. Did have prn that could be administered now. Pt continues to speak on his injustices of care with security now. Pt feels that he is competent and does not require an IVC status and feels he has the right to be transferred to other hospital.

## 2020-09-06 DIAGNOSIS — F250 Schizoaffective disorder, bipolar type: Secondary | ICD-10-CM | POA: Diagnosis not present

## 2020-09-06 MED ORDER — VALPROIC ACID 250 MG/5ML PO SOLN
1000.0000 mg | Freq: Every day | ORAL | Status: DC
  Administered 2020-09-06 – 2020-09-14 (×3): 1000 mg via ORAL
  Filled 2020-09-06 (×13): qty 20

## 2020-09-06 NOTE — ED Notes (Signed)
Hourly rounding reveals patient in room. No complaints, stable, in no acute distress. Q15 minute rounds and monitoring via Security Cameras to continue. 

## 2020-09-06 NOTE — ED Notes (Signed)
Pt requesting to take shower; shower supplies given, pt in shower at this time.

## 2020-09-06 NOTE — ED Notes (Signed)
Pt states he doesn't want his mother to be his guardian as she doesn't have his best interest at heart. Advised will have TSS come talk to him about that. Jackson [name], TTS informed that pt is requesting to speak with him.

## 2020-09-06 NOTE — ED Notes (Signed)
Pt refusing depakote stating "that pill makes me fuzzy. I'll just stay here longer coz I don't do pills. I've already told them I can't take pills. I know I may be here longer but that's okay. I'm resigned to my fate nowAir cabin crew spoke with pt in attempt to educate him why the psychiatrist wanted him on the meds to stabilize his mood and that maybe if he is med compliant he may be eligible for admission in [facility].  Pt then stated "I'll be good, I won't cause any problems I just cant take the pills". Pt advised to come to staff with any problems. Dr Toni Amend notified of pt's refusal of meds.

## 2020-09-06 NOTE — BH Assessment (Signed)
Confirmed with [facility] ([phone]), patient remain on their Waitlist.

## 2020-09-06 NOTE — Consult Note (Signed)
[facility] Face-to-Face Psychiatry Consult   Reason for Consult: Follow-up consult note for this 35 year old man with schizoaffective disorder who is still in the emergency room holding area Referring Physician: Su Hoff Patient Identification: Jackson [name] MRN:  [id number] Principal Diagnosis: Schizoaffective disorder, bipolar type (HCC) Diagnosis:  Principal Problem:   Schizoaffective disorder, bipolar type (HCC)   Total Time spent with patient: 30 minutes  Subjective:   Jackson [name] is a 35 y.o. male patient admitted with "I have resigned myself to being here".  HPI: Patient seen chart reviewed.  This is a 35 year old man with schizoaffective disorder with a history of very slow recovery and agitation.  Yesterday his olanzapine and valproic acid orders were restarted having been stopped for somewhat unclear reasons many days ago.  He was cooperative with olanzapine last night but refused Depakote.  I have been told [facility] that it is very unlikely that they will have a bed for him in the near future.  Patient continues most days to have spells of agitation and hostile disruptive behavior mostly in the evening.  I asked him if he would please take his Depakote which we know from previous experience has been quite helpful in keeping his behavior under better control.  Patient refuses on the grounds that he believes that taking Depakote will cause him to "lose myself"  Past Psychiatric History: Past history of schizoaffective disorder recurrent episodes of agitation poor ability to manage himself outside the hospital.  Very limited insight.  Risk to Self:   Risk to Others:   Prior Inpatient Therapy:   Prior Outpatient Therapy:    Past Medical History:  Past Medical History:  Diagnosis Date  . Bipolar 1 disorder (HCC)   . Schizoaffective disorder (HCC)    History reviewed. No pertinent surgical history. Family History: History reviewed. No pertinent family  history. Family Psychiatric  History: See previous Social History:  Social History   Substance and Sexual Activity  Alcohol Use Not Currently     Social History   Substance and Sexual Activity  Drug Use Not Currently    Social History   Socioeconomic History  . Marital status: Single    Spouse name: Not on file  . Number of children: Not on file  . Years of education: Not on file  . Highest education level: Not on file  Occupational History  . Not on file  Tobacco Use  . Smoking status: Light Tobacco Smoker    Packs/day: 0.25    Types: Cigarettes  . Smokeless tobacco: Never Used  Vaping Use  . Vaping Use: Never used  Substance and Sexual Activity  . Alcohol use: Not Currently  . Drug use: Not Currently  . Sexual activity: Not on file  Other Topics Concern  . Not on file  Social History Narrative  . Not on file   Social Determinants of Health   Financial Resource Strain: Not on file  Food Insecurity: Not on file  Transportation Needs: Not on file  Physical Activity: Not on file  Stress: Not on file  Social Connections: Not on file   Additional Social History:    Allergies:  No Known Allergies  Labs:  Results for orders placed or performed during the hospital encounter of 08/11/20 (from the past 48 hour(s))  SARS Coronavirus 2 by RT PCR (hospital order, performed in [facility] hospital lab) Nasopharyngeal Nasopharyngeal Swab     Status: None   Collection Time: 09/05/20  3:17 AM   Specimen: Nasopharyngeal  Swab  Result Value Ref Range   SARS Coronavirus 2 NEGATIVE NEGATIVE    Comment: (NOTE) SARS-CoV-2 target nucleic acids are NOT DETECTED.  The SARS-CoV-2 RNA is generally detectable in upper and lower respiratory specimens during the acute phase of infection. The lowest concentration of SARS-CoV-2 viral copies this assay can detect is 250 copies / mL. A negative result does not preclude SARS-CoV-2 infection and should not be used as the sole basis for  treatment or other patient management decisions.  A negative result may occur with improper specimen collection / handling, submission of specimen other than nasopharyngeal swab, presence of viral mutation(s) within the areas targeted by this assay, and inadequate number of viral copies (<250 copies / mL). A negative result must be combined with clinical observations, patient history, and epidemiological information.  Fact Sheet for Patients:   BoilerBrush.com.cy  Fact Sheet for Healthcare Providers: https://pope.com/  This test is not yet approved or  cleared by the [place] FDA and has been authorized for detection and/or diagnosis of SARS-CoV-2 by FDA under an Emergency Use Authorization (EUA).  This EUA will remain in effect (meaning this test can be used) for the duration of the COVID-19 declaration under Section 564(b)(1) of the Act, 21 U.S.C. section 360bbb-3(b)(1), unless the authorization is terminated or revoked sooner.  Performed at [facility], [address], [place], [place] [address]     Current Facility-Administered Medications  Medication Dose Route Frequency Provider Last Rate Last Admin  . diphenhydrAMINE (BENADRYL) injection 50 mg  50 mg Intramuscular Q6H PRN Malijah Lietz, [name], MD   50 mg at 08/30/20 0545  . divalproex (DEPAKOTE) DR tablet 750 mg  750 mg Oral Q12H Brexlee Heberlein T, MD   750 mg at 09/05/20 1149  . haloperidol lactate (HALDOL) injection 5 mg  5 mg Intramuscular Q6H PRN Xan Ingraham, [name], MD   5 mg at 08/30/20 0545  . hydrOXYzine (ATARAX/VISTARIL) tablet 50 mg  50 mg Oral TID PRN Sharman Cheek, MD   50 mg at 09/05/20 0419  . LORazepam (ATIVAN) tablet 2 mg  2 mg Oral Q4H PRN Helaina Stefano, [name], MD   2 mg at 09/05/20 1610   Or  . LORazepam (ATIVAN) injection 2 mg  2 mg Intramuscular Q4H PRN Mady Oubre, [name], MD   2 mg at 08/30/20 0544  . OLANZapine (ZYPREXA) injection 30 mg  30 mg  Intramuscular QHS PRN Mohmed Farver T, MD      . OLANZapine zydis (ZYPREXA) disintegrating tablet 20 mg  20 mg Oral QHS Dmarcus Decicco, [name], MD   20 mg at 09/05/20 2147   Current Outpatient Medications  Medication Sig Dispense Refill  . ARIPiprazole ER (ABILIFY MAINTENA) 400 MG SRER injection Inject 2 mLs (400 mg total) into the muscle every 28 (twenty-eight) days. 1 each 1  . divalproex (DEPAKOTE ER) 500 MG 24 hr tablet Take 1,000 mg by mouth daily.    . traZODone (DESYREL) 100 MG tablet Take 1 tablet (100 mg total) by mouth at bedtime as needed for sleep. 30 tablet 1    Musculoskeletal: Strength & Muscle Tone: within normal limits Gait & Station: normal Patient leans: N/A  Psychiatric Specialty Exam: Physical Exam Vitals and nursing note reviewed.  Constitutional:      Appearance: He is well-developed and well-nourished.  HENT:     Head: Normocephalic and atraumatic.  Eyes:     Conjunctiva/sclera: Conjunctivae normal.     Pupils: Pupils are equal, round, and reactive to  light.  Cardiovascular:     Heart sounds: Normal heart sounds.  Pulmonary:     Effort: Pulmonary effort is normal.  Abdominal:     Palpations: Abdomen is soft.  Musculoskeletal:        General: Normal range of motion.     Cervical back: Normal range of motion.  Skin:    General: Skin is warm and dry.  Neurological:     General: No focal deficit present.     Mental Status: He is alert.  Psychiatric:        Attention and Perception: He is inattentive.        Mood and Affect: Mood is anxious. Affect is inappropriate.        Speech: Speech is tangential.        Behavior: Behavior is agitated. Behavior is not aggressive.        Thought Content: Thought content is paranoid and delusional. Thought content does not include homicidal or suicidal ideation.        Cognition and Memory: Cognition is impaired. Memory is impaired.        Judgment: Judgment is inappropriate.     Review of Systems  Constitutional:  Negative.   HENT: Negative.   Eyes: Negative.   Respiratory: Negative.   Cardiovascular: Negative.   Gastrointestinal: Negative.   Musculoskeletal: Negative.   Skin: Negative.   Neurological: Negative.   Psychiatric/Behavioral: Negative.     Blood pressure 109/79, pulse 64, temperature 98 F (36.7 C), temperature source Oral, resp. rate 18, height 5\' 11"  (1.803 m), weight 88 kg, SpO2 100 %.Body mass index is 27.06 kg/m.  General Appearance: Casual  Eye Contact:  Good  Speech:  Clear and Coherent  Volume:  Normal  Mood:  Euthymic  Affect:  Congruent  Thought Process:  Goal Directed  Orientation:  Full (Time, Place, and Person)  Thought Content:  Rumination and Tangential  Suicidal Thoughts:  No  Homicidal Thoughts:  No  Memory:  Immediate;   Fair Recent;   Poor Remote;   Poor  Judgement:  Impaired  Insight:  Shallow  Psychomotor Activity:  Normal  Concentration:  Concentration: Poor  Recall:  Poor  Fund of Knowledge:  Good  Language:  Good  Akathisia:  No  Handed:  Right  AIMS (if indicated):     Assets:  Physical Health Resilience Social Support  ADL's:  Impaired  Cognition:  Impaired,  Mild  Sleep:        Treatment Plan Summary: Plan I was hoping to convince him to take his Depakote by reminding him that if he is medication compliant we would be more likely to be able to justify admitting him to our unit which would then be a step forwards towards what I think is still his goal of getting discharged.  Patient declined to follow this line of reasoning.  States he will not take the Depakote because it makes him "lose myself".  We will continue the orders for now and keep working with him around this.  Monitor behavior.  See if there is any improvement in the spells of agitation.  Disposition: Recommend psychiatric Inpatient admission when medically cleared. Supportive therapy provided about ongoing stressors. Discussed crisis plan, support from social network,  calling 911, coming to the Emergency Department, and calling Suicide Hotline.  , MD 09/06/2020 10:03 AM

## 2020-09-06 NOTE — ED Notes (Signed)
Dr Toni Amend speaking with pt in dayroom

## 2020-09-06 NOTE — ED Provider Notes (Signed)
Emergency Medicine Observation Re-evaluation Note  Jackson [name] is a 35 y.o. male, seen on rounds today.  Pt initially presented to the ED for complaints of Psychiatric Evaluation Currently, the patient is calm, resting.  Physical Exam  BP 110/73 (BP Location: Right Arm)   Pulse 90   Temp 98.7 F (37.1 C) (Oral)   Resp 17   Ht 5\' 11"  (1.803 m)   Wt 88 kg   SpO2 99%   BMI 27.06 kg/m  Physical Exam General: NAD Cardiac: well perfused Lungs: normal WOB Psych: no distres  ED Course / MDM  EKG:EKG Interpretation  Date/Time:  Wednesday September 05 2020 03:40:56 EST Ventricular Rate:  70 PR Interval:  246 QRS Duration: 88 QT Interval:  352 QTC Calculation: 380 R Axis:   35 Text Interpretation: Sinus rhythm with 1st degree A-V block Otherwise normal ECG No significant change since last tracing Confirmed by Ward, 05-01-2006 [phone]) on 09/05/2020 4:02:14 AM    I have reviewed the labs performed to date as well as medications administered while in observation.  Recent changes in the last 24 hours include none.  Plan  Current plan is for IVC, psych dispo. Patient is under full IVC at this time.   11/03/2020, MD 09/06/20 [phone]

## 2020-09-06 NOTE — ED Notes (Signed)
Pt given lunch with juice. No other needs voiced at this time.

## 2020-09-06 NOTE — ED Notes (Signed)
Pt out of shower in DR at this time.

## 2020-09-06 NOTE — ED Notes (Addendum)
Patient wanted to talk about phone call with Mother. He called and states mother has called him 'Bonita Quin" which really bothers him. He doesn't want to call her "mom"and he says I will call you "gardian" and she says she doesn't want to be called anything! He is very frustrated with being called "Bonita Quin" by his mother. He doesn't understand why he can't be kept by the  State instead of his mother. He states she doesn't have his best interest and wants to be set free. He wanted me to state this to the Doctor, and I will inform the RN and Doctor.

## 2020-09-06 NOTE — ED Notes (Signed)
IVC/ [facility] Waitlist 

## 2020-09-07 NOTE — ED Notes (Signed)
Pt verbalizes anger towards me because the telephone is not working at this time  The phone says no line  - he verbalizes that I have messed the phone up so that he cannot use it   He wrote a note that stated  "I am not going to talk to you ever again because you are a liar.  I know telecommunications and you are mistreating me by not letting me use the phone"     I attempted to reassure him but he walked away  - I am unsure why the phone was not working

## 2020-09-07 NOTE — ED Notes (Signed)
He has awakened  - supper tray provided    He asked for the phone and I gave it to him  - he talked for a few minutes and then came to the door  "I am sorry that I was mean to you - you are nice to me everyday - I am sorry"   Pt reassured  Continue to monitor

## 2020-09-07 NOTE — ED Notes (Signed)
Hourly rounding reveals patient in room. No complaints, stable, in no acute distress. Q15 minute rounds and monitoring via Security Cameras to continue. 

## 2020-09-07 NOTE — ED Notes (Signed)
Report to include Situation, Background, Assessment, and Recommendations received from Amy RN. Patient alert and oriented, warm and dry, in no acute distress. Patient denies SI, HI, AVH and pain. Patient made aware of Q15 minute rounds and security cameras for their safety. Patient instructed to come to me with needs or concerns.  

## 2020-09-07 NOTE — BH Assessment (Addendum)
Confirmed with [facility]([phone]), patientremainson their Waitlist. 

## 2020-09-07 NOTE — ED Provider Notes (Signed)
Emergency Medicine Observation Re-evaluation Note  Jackson [name] is a 35 y.o. male, seen on rounds today.  Pt initially presented to the ED for complaints of Psychiatric Evaluation Currently, the patient is calm, denies any acute complaints.  Physical Exam  BP 119/77   Pulse 71   Temp 98.2 F (36.8 C) (Oral)   Resp 17   Ht 5\' 11"  (1.803 m)   Wt 88 kg   SpO2 100%   BMI 27.06 kg/m  Physical Exam General: calm, no acute distress Cardiac: normal rate Lungs: equal chest rise, no respiratory distress  Psych: calm, cooperative   ED Course / MDM  EKG:EKG Interpretation  Date/Time:  Wednesday September 05 2020 03:40:56 EST Ventricular Rate:  70 PR Interval:  246 QRS Duration: 88 QT Interval:  352 QTC Calculation: 380 R Axis:   35 Text Interpretation: Sinus rhythm with 1st degree A-V block Otherwise normal ECG No significant change since last tracing Confirmed by Ward, 05-01-2006 [phone]) on 09/05/2020 4:02:14 AM    I have reviewed the labs performed to date as well as medications administered while in observation.  Recent changes in the last 24 hours include none.  Plan  Current plan is for IVC, psych dispo. Patient is under full IVC at this time.   11/03/2020, MD 09/07/20 [phone]

## 2020-09-07 NOTE — ED Notes (Signed)
Unable to obtain vitals due to patient sleeping. Will continue to monitor.   

## 2020-09-08 NOTE — ED Notes (Signed)
Hourly rounding reveals patient in room. No complaints, stable, in no acute distress. Q15 minute rounds and monitoring via Security Cameras to continue. 

## 2020-09-08 NOTE — ED Notes (Signed)
IVC papers renewed  on [facility] wait list

## 2020-09-08 NOTE — ED Notes (Signed)
VS not taken, patient asleep 

## 2020-09-08 NOTE — ED Notes (Signed)
Snack and beverage given. 

## 2020-09-08 NOTE — ED Notes (Signed)
Patient taking a shower.

## 2020-09-08 NOTE — ED Notes (Signed)
Patient  Ate 100% of supper and beverage.

## 2020-09-08 NOTE — ED Notes (Signed)
Patient ate 100% of breakfast and beverage, he is without any behavioral issues, calm and cooperative, will continue to monitor, he denies Si/hi or avh. Camera surveillance in progress for safety also.

## 2020-09-08 NOTE — BH Assessment (Signed)
Confirmed with [facility]([phone]), patientremainson their Waitlist.

## 2020-09-08 NOTE — ED Notes (Signed)
Report to include Situation, Background, Assessment, and Recommendations received from Wendy RN. Patient alert and oriented, warm and dry, in no acute distress. Patient denies SI, HI, AVH and pain. Patient made aware of Q15 minute rounds and security cameras for their safety. Patient instructed to come to me with needs or concerns.  

## 2020-09-09 NOTE — ED Notes (Signed)
Hourly rounding reveals patient in room. No complaints, stable, in no acute distress. Q15 minute rounds and monitoring via Security Cameras to continue. 

## 2020-09-09 NOTE — ED Notes (Signed)
Meal tray given 

## 2020-09-09 NOTE — ED Notes (Signed)
Pt sleeping. VS will be taken once awake. 

## 2020-09-10 NOTE — ED Notes (Signed)
Hourly rounding completed at this time, patient currently awake in room. No complaints, stable, and in no acute distress. Q15 minute rounds and monitoring via Security Cameras to continue. 

## 2020-09-10 NOTE — ED Notes (Signed)
VS not taken, patient asleep 

## 2020-09-10 NOTE — BH Assessment (Signed)
Confirmed with [facility]([phone]), patientremainson their Waitlist.

## 2020-09-10 NOTE — BH Assessment (Signed)
Confirmed with [facility]([phone]), patientremainson their Waitlist.

## 2020-09-10 NOTE — ED Notes (Signed)
Hourly rounding reveals patient in room. No complaints, stable, in no acute distress. Q15 minute rounds and monitoring via Security Cameras to continue. 

## 2020-09-10 NOTE — ED Notes (Signed)
IVC (5TH SET)  ON  [facility]  WAITLIST

## 2020-09-10 NOTE — ED Notes (Signed)
Report received from [facility] including  Situation, Background, Assessment, and Recommendations. Patient alert and oriented, warm and dry, in no acute distress. Patient denies SI, HI, AVH and pain. Patient made aware of Q15 minute rounds and security cameras for their safety. Patient instructed to come to this nurse with needs or concerns.

## 2020-09-10 NOTE — ED Notes (Signed)
Pt provided with snack and drink at this time, reminded of rules of BHU through night.

## 2020-09-10 NOTE — ED Provider Notes (Signed)
Emergency Medicine Observation Re-evaluation Note  Jackson [name] is a 35 y.o. male, seen on rounds today.  Pt initially presented to the ED for complaints of Psychiatric Evaluation Currently, the patient is resting comfortably.  Physical Exam  BP 128/80   Pulse 75   Temp 98 F (36.7 C) (Oral)   Resp 18   Ht 5\' 11"  (1.803 m)   Wt 88 kg   SpO2 100%   BMI 27.06 kg/m  Physical Exam Constitutional:      Appearance: He is not ill-appearing or toxic-appearing.  HENT:     Head: Atraumatic.  Cardiovascular:     Comments: Well perfused Pulmonary:     Effort: Pulmonary effort is normal.  Abdominal:     General: There is no distension.  Musculoskeletal:        General: No deformity.  Skin:    Findings: No rash.  Neurological:     General: No focal deficit present.     Cranial Nerves: No cranial nerve deficit.      ED Course / MDM  EKG:EKG Interpretation  Date/Time:  Wednesday September 05 2020 03:40:56 EST Ventricular Rate:  70 PR Interval:  246 QRS Duration: 88 QT Interval:  352 QTC Calculation: 380 R Axis:   35 Text Interpretation: Sinus rhythm with 1st degree A-V block Otherwise normal ECG No significant change since last tracing Confirmed by Ward, 05-01-2006 [phone]) on 09/05/2020 4:02:14 AM    I have reviewed the labs performed to date as well as medications administered while in observation.  Recent changes in the last 24 hours include none.  Plan  Current plan is for inpatient psychiatric care. Patient is under full IVC at this time.   11/03/2020, MD 09/10/20 [phone]

## 2020-09-10 NOTE — ED Notes (Signed)
Pt has been at window of desk talking to security about his story of arriving to ED. Pt is talkative and confabulation in conversation, pt is talking to talk and does not appear to be talking for conversation.

## 2020-09-10 NOTE — ED Notes (Signed)
Hourly rounding completed at this time, patient currently awake in dayroom. No complaints, stable, and in no acute distress. Q15 minute rounds and monitoring via Security Cameras to continue. 

## 2020-09-10 NOTE — ED Notes (Signed)
Report to include Situation, Background, Assessment, and Recommendations received from RN. Patient alert and oriented, warm and dry, in no acute distress. Patient denies SI, HI, AVH and pain. Patient made aware of Q15 minute rounds and security cameras for their safety. Patient instructed to come to me with needs or concerns.  

## 2020-09-10 NOTE — ED Notes (Signed)
Report to include Situation, Background, Assessment, and Recommendations received from Amy RN. Patient alert and oriented, warm and dry, in no acute distress. Patient denies SI, HI, AVH and pain. Patient made aware of Q15 minute rounds and security cameras for their safety. Patient instructed to come to me with needs or concerns.  

## 2020-09-10 NOTE — ED Notes (Signed)
Patient up to the bathroom, He is calm and cooperative, will continue to monitor, Patient denies Si/hi/avh to nurse when He came out of restroom, Patient had 2 trays for breakfast, He has good appetite. Camera surveillance in progress for safety.
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
## 2020-09-10 NOTE — ED Notes (Signed)
Patient is sitting in the dayroom, ask for phone, He is calm and cooperative, will continue to monitor.

## 2020-09-10 NOTE — ED Notes (Signed)
Pt continues to state that he is unable to take any "psychotic medicine" because "they make me lose my mind."

## 2020-09-10 NOTE — ED Notes (Signed)
Pt to desk. States that he is having hard time sleeping, pt appears to be anxious, continues to be hypertalkative. PRN to be given
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
## 2020-09-11 NOTE — ED Notes (Signed)
Pt has been in restroom now for 20 minutes, pt in no distress when this nurse spoke to pt through door. Will continue to monitor.

## 2020-09-11 NOTE — BH Assessment (Signed)
Confirmed with [facility]([phone]), patientremainson their Waitlist.

## 2020-09-11 NOTE — ED Notes (Signed)
Hourly rounding completed at this time, patient currently asleep in room. No complaints, stable, and in no acute distress. Q15 minute rounds and monitoring via Security Cameras to continue. 

## 2020-09-11 NOTE — ED Notes (Signed)
Hourly rounding completed at this time, patient currently awake in dayroom. No complaints, stable, and in no acute distress. Q15 minute rounds and monitoring via Security Cameras to continue. 
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
## 2020-09-11 NOTE — ED Notes (Signed)
Report received from Jessica, RN including  Situation, Background, Assessment, and Recommendations. Patient alert and oriented, warm and dry, in no acute distress. Patient denies SI, HI, AVH and pain. Patient made aware of Q15 minute rounds and security cameras for their safety. Patient instructed to come to this nurse with needs or concerns. 

## 2020-09-11 NOTE — ED Notes (Signed)
Pt out of restroom, pt requests night snack and drink. Pt provided with [place] sandwich tray and icy with apple juice at this time.

## 2020-09-11 NOTE — ED Notes (Signed)
Hourly rounding completed at this time, patient currently awake in restroom. No complaints, stable, and in no acute distress. Q15 minute rounds and monitoring via Security Cameras to continue. 

## 2020-09-11 NOTE — ED Notes (Signed)
Mom/legal guardian did not answer. Pt spoke with mom for a couple of mins when other pt handed him the phone earlier.

## 2020-09-11 NOTE — ED Notes (Signed)
Pt showered with new linens and new clothing.

## 2020-09-11 NOTE — ED Notes (Signed)
Hourly rounding completed at this time, patient currently awake in room. No complaints, stable, and in no acute distress. Q15 minute rounds and monitoring via Security Cameras to continue. 
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
## 2020-09-11 NOTE — ED Provider Notes (Signed)
Emergency Medicine Observation Re-evaluation Note  Jackson [name] is a 35 y.o. male, seen on rounds today.  Pt initially presented to the ED for complaints of Psychiatric Evaluation Currently, the patient is talking with another patient, requests release.  Physical Exam  BP 119/78 (BP Location: Right Arm)   Pulse 75   Temp 98.3 F (36.8 C) (Oral)   Resp 18   Ht 5\' 11"  (1.803 m)   Wt 88 kg   SpO2 100%   BMI 27.06 kg/m  Physical Exam Constitutional: Resting comfortably. Eyes: Conjunctivae are normal. Head: Atraumatic. Nose: No congestion/rhinnorhea. Mouth/Throat: Mucous membranes are moist. Neck: Normal ROM Cardiovascular: No cyanosis noted. Respiratory: Normal respiratory effort. Gastrointestinal: Non-distended. Genitourinary: deferred Musculoskeletal: No lower extremity tenderness nor edema. Neurologic:  Normal speech and language. No gross focal neurologic deficits are appreciated. Skin:  Skin is warm, dry and intact. No rash noted.    ED Course / MDM  EKG:EKG Interpretation  Date/Time:  Wednesday September 05 2020 03:40:56 EST Ventricular Rate:  70 PR Interval:  246 QRS Duration: 88 QT Interval:  352 QTC Calculation: 380 R Axis:   35 Text Interpretation: Sinus rhythm with 1st degree A-V block Otherwise normal ECG No significant change since last tracing Confirmed by Ward, 05-01-2006 [phone]) on 09/05/2020 4:02:14 AM    I have reviewed the labs performed to date as well as medications administered while in observation.  Recent changes in the last 24 hours include patient continues to request release, I explained that he still requires admission per psychiatry.  Plan  Current plan is for admission to [facility]. Patient is under full IVC at this time.   [facility], MD 09/11/20 [phone]

## 2020-09-11 NOTE — ED Notes (Signed)
IVC/Pending placement with [facility]

## 2020-09-11 NOTE — ED Notes (Signed)
Pt to desk, speaking in soft manor to this nurse. States, "you aren't going to give me a bad report for tonight, are you?" Pt then starts to continue with same conversations as before.

## 2020-09-11 NOTE — ED Notes (Signed)
Pt asleep at this time, unable to collect vitals. Will collect pt vitals once awake. 

## 2020-09-11 NOTE — ED Notes (Signed)
Hourly rounding completed at this time, patient currently awake in day room. Q15 minute rounds and monitoring via Security Cameras to continue. 

## 2020-09-11 NOTE — ED Notes (Signed)
Pt is escalating and there is difficulty in calming pt. He is fixed on talking about the bible and all of the injustices in life. Pt is very labile with his emotions, smacking himself on his arm and face. Pt is adamant that he is sane and able to go home and take care of self. Pt had requested earlier for something for anxiety and felt like he was having a wave over him. Pt then has this conversation and is comparing himself and his life to the life of Jesus. Pt is uninterested in any input from this nurse and is rapidly speaking and has pressured speech

## 2020-09-11 NOTE — ED Notes (Signed)
Hourly rounding completed at this time, patient currently awake in day room. Q15 minute rounds and monitoring via [employer] to continue.

## 2020-09-12 NOTE — BH Assessment (Signed)
Confirmed with [facility]([phone]), patientremainson their Waitlist.

## 2020-09-12 NOTE — ED Notes (Signed)
Pt given [place] tray and sprite to drink. Pt stable at this time.

## 2020-09-12 NOTE — ED Notes (Signed)
Hourly rounding completed at this time, patient currently asleep in room. No complaints, stable, and in no acute distress. Q15 minute rounds and monitoring via Security Cameras to continue. 

## 2020-09-12 NOTE — ED Notes (Signed)
He has ambulated to and from the Br with a steady gait  - he tapped on the window and said "Good morning"  No verbalized needs or concerns at this time

## 2020-09-12 NOTE — ED Notes (Signed)
IVC pending placement [facility]

## 2020-09-12 NOTE — ED Provider Notes (Signed)
Emergency Medicine Observation Re-evaluation Note  Jackson [name] is a 35 y.o. male, seen on rounds today.  Pt initially presented to the ED for complaints of Psychiatric Evaluation Currently, the patient is calm and cooperative.  He is ambulating around the common area and wanted to talk to me about possible discharge.  He has no acute complaints.  Physical Exam  BP 109/73 (BP Location: Left Arm)   Pulse 70   Temp 98.7 F (37.1 C)   Resp 17   Ht 5\' 11"  (1.803 m)   Wt 88 kg   SpO2 99%   BMI 27.06 kg/m  Physical Exam General: Alert and oriented.  Ambulating with steady gait. Cardiac: Good peripheral perfusion. Lungs: Normal respiratory effort. Psych: Calm and cooperative.  Somewhat tangential and disorganized thought content and delusional beliefs.  ED Course / MDM    I have reviewed the labs performed to date as well as medications administered while in observation.  There have been no recent changes to the patient's status or acute events in the last 24 hours.  The patient wanted to talk to me about his disposition and states that I am the only doctor that has listened to him.  He feels that he is here due to a misunderstanding and is not violent, and that his guardian (his mother) has turned people against him.  He asked me to advocate for him.  I stated that I would confer with psychiatry as we do daily and that the psychiatric and social work teams are actively working on disposition for him.  Plan  Current plan is for placement per psychiatry team recommendations.  The patient is on the wait list at [facility]. Patient is under full IVC at this time.   [facility], MD 09/12/20 1318

## 2020-09-12 NOTE — ED Notes (Signed)
Report received from Amy, RN including  Situation, Background, Assessment, and Recommendations. Patient alert and oriented, warm and dry, in no acute distress. Patient denies SI, HI, AVH and pain. Patient made aware of Q15 minute rounds and security cameras for their safety. Patient instructed to come to this nurse with needs or concerns. 

## 2020-09-12 NOTE — ED Notes (Signed)
Hourly rounding completed at this time, patient currently awake in room. No complaints, stable, and in no acute distress. Q15 minute rounds and monitoring via Security Cameras to continue. 

## 2020-09-12 NOTE — ED Notes (Signed)
Hourly rounding completed at this time, patient currently awake in dayroom. No complaints, stable, and in no acute distress. Q15 minute rounds and monitoring via Security Cameras to continue. 

## 2020-09-12 NOTE — ED Notes (Signed)
Pt continues to refuses any "psychiatric meds" but continues to ask for meds for anxiety

## 2020-09-12 NOTE — ED Notes (Signed)
Pt asleep at this time, unable to collect vitals. Will collect pt vitals once awake. 

## 2020-09-13 NOTE — ED Provider Notes (Addendum)
Emergency Medicine Observation Re-evaluation Note  Jackson [name] is a 35 y.o. male, seen on rounds today.  Pt initially presented to the ED for complaints of Psychiatric Evaluation  Currently, the patient is is no acute distress. Denies any concerns at this time. Asking if any update on when he will be discharged.   Physical Exam  Blood pressure 100/71, pulse 87, temperature 97.9 F (36.6 C), temperature source Oral, resp. rate 18, height 5\' 11"  (1.803 m), weight 88 kg, SpO2 99 %.  Physical Exam General: No apparent distress HEENT: moist mucous membranes CV: RRR Pulm: Normal WOB GI: soft and non tender MSK: no edema or cyanosis Neuro: face symmetric, moving all extremities     ED Course / MDM     I have reviewed the labs performed to date as well as medications administered while in observation.  Recent changes in the last 24 hours include none   Plan   Current plan is to continue to wait for psych plan/placement if felt warranted  Patient is under full IVC at this time. D/w pt that it is up to psych to decide on discharge. Pt expressed understanding.    , MD 09/13/20 11/11/20    4315, MD 09/13/20 [phone]

## 2020-09-13 NOTE — ED Notes (Signed)
Hourly rounding completed at this time, patient currently asleep in room. No complaints, stable, and in no acute distress. Q15 minute rounds and monitoring via [employer] to continue.  Pt asleep at this time, unable to collect vitals. Will collect pt vitals once awake.

## 2020-09-13 NOTE — ED Notes (Signed)
Hourly rounding completed at this time, patient currently asleep in room. No complaints, stable, and in no acute distress. Q15 minute rounds and monitoring via Security Cameras to continue. 

## 2020-09-13 NOTE — BH Assessment (Signed)
Confirmed with [facility]([phone]), patientremainson their Waitlist.

## 2020-09-13 NOTE — ED Notes (Signed)
IVC  ON  [facility]  WAITLIST 

## 2020-09-13 NOTE — ED Notes (Signed)
Hourly rounding completed at this time, patient currently awake in restroom. No complaints, stable, and in no acute distress. Q15 minute rounds and monitoring via Security Cameras to continue. 

## 2020-09-13 NOTE — ED Notes (Signed)
IVC pending placement 

## 2020-09-13 NOTE — ED Notes (Signed)
Hourly rounding completed at this time, patient currently awake in room. No complaints, stable, and in no acute distress. Q15 minute rounds and monitoring via Security Cameras to continue. 

## 2020-09-13 NOTE — BH Assessment (Signed)
Confirmed with [facility]([phone]), patientremainson their Waitlist.

## 2020-09-13 NOTE — ED Notes (Signed)

## 2020-09-14 NOTE — ED Notes (Signed)
Patient sleeping no vital signs at this time 

## 2020-09-14 NOTE — BH Assessment (Signed)
Confirmed with [facility]([phone]), patientremainson their Waitlist.  

## 2020-09-14 NOTE — BH Assessment (Signed)
Confirmed with [facility]([phone]), patientremainson their Waitlist.

## 2020-09-14 NOTE — ED Notes (Signed)
Pt to RN station requesting apple juice and "prn" for "feeling antsy", pt appears calm and cooperative att, specified "no vistaril"

## 2020-09-14 NOTE — ED Provider Notes (Signed)
Emergency Medicine Observation Re-evaluation Note  Jackson [name] is a 35 y.o. male, seen on rounds today.  Pt initially presented to the ED for complaints of Psychiatric Evaluation Currently, the patient is resting, covers on, in bed without distress.  Physical Exam  BP 119/73 (BP Location: Left Arm)   Pulse 70   Temp 98.1 F (36.7 C) (Oral)   Resp 17   Ht 5\' 11"  (1.803 m)   Wt 88 kg   SpO2 100%   BMI 27.06 kg/m  Physical Exam General: Calm, resting, sleeping Psych: Calm, sleep without agitation  ED Course / MDM   I have reviewed the labs performed to date as well as medications administered while in observation.  Recent changes in the last 24 hours include continuing to.  Plan  Current plan is for placement, currently considering [facility]. Patient is under full IVC at this time.   , MD 09/14/20 [phone]

## 2020-09-15 NOTE — ED Notes (Signed)
Report to include Situation, Background, Assessment, and Recommendations received from [facility]. Patient alert and oriented, warm and dry, in no acute distress. Patient denies SI, HI, AVH and pain. Patient made aware of Q15 minute rounds and security cameras for their safety. Patient instructed to come to me with needs or concerns.

## 2020-09-15 NOTE — ED Notes (Signed)
Hourly rounding reveals patient in room. No complaints, stable, in no acute distress. Q15 minute rounds and monitoring via Security Cameras to continue. 

## 2020-09-15 NOTE — ED Notes (Signed)
BEHAVIORAL HEALTH ROUNDING Patient sleeping: No. Patient alert and oriented: yes Behavior appropriate: Yes.  ; If no, describe:   Nutrition and fluids offered: Yes  Toileting and hygiene offered: Yes  Sitter present: not applicable Law enforcement present: Yes   Patient assigned to appropriate care area. Patient oriented to unit/care area: Informed that, for their safety, care areas are designed for safety and monitored by security cameras at all times; and visiting hours explained to patient. Patient verbalizes understanding, and verbal contract for safety obtained.  ED BHU PLACEMENT JUSTIFICATION Is the patient under IVC or is there intent for IVC: Yes.   Is the patient medically cleared: Yes.   Is there vacancy in the ED BHU: Yes.   Is the population mix appropriate for patient: Yes.   Is the patient awaiting placement in inpatient or outpatient setting: Yes.   Has the patient had a psychiatric consult: Yes.   Survey of unit performed for contraband, proper placement and condition of furniture, tampering with fixtures in bathroom, shower, and each patient room: Yes.  ; Findings:   APPEARANCE/BEHAVIOR calm, cooperative and adequate rapport can be established NEURO ASSESSMENT Orientation: time, place and person Hallucinations: No.None noted (Hallucinations) Speech: Normal Gait: normal RESPIRATORY ASSESSMENT Normal expansion.  Clear to auscultation.  No rales, rhonchi, or wheezing., No chest wall tenderness., No kyphosis or scoliosis. CARDIOVASCULAR ASSESSMENT regular rate and rhythm, S1, S2 normal, no murmur, click, rub or gallop GASTROINTESTINAL ASSESSMENT soft, nontender, BS WNL, no r/g EXTREMITIES normal strength, tone, and muscle mass, no deformities, no erythema, induration, or nodules, no evidence of joint effusion PLAN OF CARE Provide calm/safe environment. Vital signs assessed twice daily. ED BHU Assessment once each 12-hour shift. Collaborate with intake RN daily or as  condition indicates. Assure the ED provider has rounded once each shift. Provide and encourage hygiene. Provide redirection as needed. Assess for escalating behavior; address immediately and inform ED provider.  Assess family dynamic and appropriateness for visitation as needed: Yes.  ; If necessary, describe findings: screen Educate the patient/family about BHU procedures/visitation: Yes.  ; If necessary, describe findings:    ENVIRONMENTAL ASSESSMENT Potentially harmful objects out of patient reach: Yes.   Personal belongings secured: Yes.   Patient dressed in hospital provided attire only: Yes.   Plastic bags out of patient reach: Yes.   Patient care equipment (cords, cables, call bells, lines, and drains) shortened, removed, or accounted for: Yes.   Equipment and supplies removed from bottom of stretcher: Yes.   Potentially toxic materials out of patient reach: Yes.   Sharps container removed or out of patient reach: Yes.

## 2020-09-15 NOTE — ED Notes (Signed)
Patient requested shower supplies and shower. Clean clothes and oral hygiene supplied provided. Patient showered changed his clothes and went into his room to have dinner.

## 2020-09-15 NOTE — ED Notes (Signed)
After extensive discussion with pt; pt reports medicine given pt is making pt feel "dull" and "unable to hold onto thoughts"  Pt was given Zyprexa crushed in juice and Depakote in sprite and told this was only the Zyprexa, pt given list of possible side effects of Zyprexa, pt adamantly denies need for "pysch meds - they make me crazy" pt resolving to refuse further meds  Pt reports that only Vistaril and Ativan are safe to take, pt with pressured rambling speech, not as disjointed as seen 2-3 weeks prior but still obsessed with the idea that Crystal (pt's mother and legal guardian) is controlling his life, pt has multiple examples of how "to beat the system": "they say I'm hyper religious - so now I say I can't be - I'm an Atheist. ... of course I'm not, but you know... I gotta say that"

## 2020-09-15 NOTE — ED Notes (Signed)
pt given meal tray and ginger ale with strawberry icee

## 2020-09-15 NOTE — ED Notes (Signed)
Shower offered patient declined

## 2020-09-15 NOTE — BH Assessment (Signed)
Confirmed with [facility]([phone]), patientremainson their Waitlist.

## 2020-09-16 NOTE — ED Provider Notes (Signed)
Emergency Medicine Observation Re-evaluation Note  Jackson [name] is a 35 y.o. male, seen on rounds today.  Pt initially presented to the ED for complaints of Psychiatric Evaluation Currently, the patient is sleeping comfortably.  Physical Exam  BP 111/63 (BP Location: Left Arm)   Pulse 75   Temp 98.6 F (37 C) (Oral)   Resp 18   Ht 5\' 11"  (1.803 m)   Wt 88 kg   SpO2 100%   BMI 27.06 kg/m  Physical Exam Constitutional:      Appearance: He is not ill-appearing or toxic-appearing.  HENT:     Head: Atraumatic.  Cardiovascular:     Comments: Well perfused Pulmonary:     Effort: Pulmonary effort is normal.  Abdominal:     General: There is no distension.  Musculoskeletal:        General: No deformity.  Skin:    Findings: No rash.  Neurological:     General: No focal deficit present.     Cranial Nerves: No cranial nerve deficit.      ED Course / MDM  EKG:EKG Interpretation  Date/Time:  Wednesday September 05 2020 03:40:56 EST Ventricular Rate:  70 PR Interval:  246 QRS Duration: 88 QT Interval:  352 QTC Calculation: 380 R Axis:   35 Text Interpretation: Sinus rhythm with 1st degree A-V block Otherwise normal ECG No significant change since last tracing Confirmed by Ward, [birth year]2006 [phone]) on 09/05/2020 4:02:14 AM    I have reviewed the labs performed to date as well as medications administered while in observation.  Recent changes in the last 24 hours include none.  Plan  Current plan is for inpatient psychiatric care. Patient is under full IVC at this time.   11/03/2020, MD 09/16/20 [phone]

## 2020-09-16 NOTE — ED Notes (Signed)
Snack and beverage given. 

## 2020-09-16 NOTE — ED Notes (Signed)
Hourly rounding reveals patient in room. No complaints, stable, in no acute distress. Q15 minute rounds and monitoring via Security Cameras to continue. 

## 2020-09-16 NOTE — ED Notes (Signed)
Report to include Situation, Background, Assessment, and Recommendations received from Sarah RN. Patient alert and oriented, warm and dry, in no acute distress. Patient denies SI, HI, AVH and pain. Patient made aware of Q15 minute rounds and security cameras for their safety. Patient instructed to come to me with needs or concerns.  

## 2020-09-16 NOTE — ED Notes (Signed)
VS not taken, patient asleep 

## 2020-09-16 NOTE — ED Notes (Signed)
Given meal tray.

## 2020-09-16 NOTE — ED Notes (Signed)
VS obtained by this RN. Pt remains calm and cooperative with staff. Pt visualized in NAD, explained to patient will awaken patient when breakfast trays arrive. Pt states "alright cool, that's what's up". Lights dimmed for patient comfort, pt denies further needs.

## 2020-09-16 NOTE — ED Notes (Signed)
Pt provided with breakfast tray at this time.

## 2020-09-16 NOTE — ED Notes (Signed)
Pt up to bathroom.

## 2020-09-16 NOTE — ED Notes (Signed)
Pt. Was given a snack and phone.

## 2020-09-16 NOTE — ED Notes (Signed)
Pt. Out of the shower and was provided with body lotion

## 2020-09-16 NOTE — ED Notes (Signed)
Pt visualized resting in bed with lights turned off for comfort, awakens easily, denies any needs.

## 2020-09-16 NOTE — ED Notes (Signed)
Lunch tray was given with apple and orange juice and soda to drink.

## 2020-09-16 NOTE — ED Notes (Signed)
IVC papers renewed/pending placement.

## 2020-09-17 ENCOUNTER — Encounter: Payer: Self-pay | Admitting: Psychiatry

## 2020-09-17 ENCOUNTER — Inpatient Hospital Stay
Admission: AD | Admit: 2020-09-17 | Discharge: 2020-09-28 | DRG: 885 | Disposition: A | Discharge status: Home / Self Care | Payer: No Typology Code available for payment source | Source: Intra-hospital | Attending: Behavioral Health | Admitting: Behavioral Health

## 2020-09-17 ENCOUNTER — Other Ambulatory Visit: Payer: Self-pay

## 2020-09-17 DIAGNOSIS — F419 Anxiety disorder, unspecified: Secondary | ICD-10-CM | POA: Diagnosis present

## 2020-09-17 DIAGNOSIS — F17210 Nicotine dependence, cigarettes, uncomplicated: Secondary | ICD-10-CM | POA: Diagnosis present

## 2020-09-17 DIAGNOSIS — Z5900 Homelessness unspecified: Secondary | ICD-10-CM | POA: Diagnosis not present

## 2020-09-17 DIAGNOSIS — F29 Unspecified psychosis not due to a substance or known physiological condition: Secondary | ICD-10-CM | POA: Diagnosis not present

## 2020-09-17 DIAGNOSIS — Z9114 Patient's other noncompliance with medication regimen: Secondary | ICD-10-CM

## 2020-09-17 DIAGNOSIS — Z20822 Contact with and (suspected) exposure to covid-19: Secondary | ICD-10-CM | POA: Diagnosis present

## 2020-09-17 DIAGNOSIS — Z79899 Other long term (current) drug therapy: Secondary | ICD-10-CM | POA: Diagnosis not present

## 2020-09-17 DIAGNOSIS — F250 Schizoaffective disorder, bipolar type: Secondary | ICD-10-CM | POA: Diagnosis present

## 2020-09-17 DIAGNOSIS — Z9119 Patient's noncompliance with other medical treatment and regimen: Secondary | ICD-10-CM

## 2020-09-17 DIAGNOSIS — Z91199 Patient's noncompliance with other medical treatment and regimen due to unspecified reason: Secondary | ICD-10-CM

## 2020-09-17 LAB — RESP PANEL BY RT-PCR (FLU A&B, COVID) ARPGX2
Influenza A by PCR: NEGATIVE
Influenza B by PCR: NEGATIVE
SARS Coronavirus 2 by RT PCR: NEGATIVE

## 2020-09-17 MED ORDER — DIPHENHYDRAMINE HCL 50 MG/ML IJ SOLN: 50.0000 mg | Freq: Four times a day (QID) | INTRAMUSCULAR | Status: DC | PRN

## 2020-09-17 MED ORDER — VALPROIC ACID 250 MG/5ML PO SOLN
1000.0000 mg | Freq: Every day | ORAL | Status: DC
  Filled 2020-09-17 (×2): qty 20

## 2020-09-17 MED ORDER — LORAZEPAM 2 MG PO TABS
2.0000 mg | ORAL_TABLET | ORAL | Status: DC | PRN
  Administered 2020-09-18 – 2020-09-19 (×2): 2 mg via ORAL
  Filled 2020-09-17 (×2): qty 1

## 2020-09-17 MED ORDER — LORAZEPAM 2 MG/ML IJ SOLN: 2.0000 mg | INTRAMUSCULAR | Status: DC | PRN

## 2020-09-17 MED ORDER — ALUM & MAG HYDROXIDE-SIMETH 200-200-20 MG/5ML PO SUSP
30.0000 mL | ORAL | Status: DC | PRN
Start: 2020-09-17 — End: 2020-09-28

## 2020-09-17 MED ORDER — ACETAMINOPHEN 325 MG PO TABS
650.0000 mg | ORAL_TABLET | Freq: Four times a day (QID) | ORAL | Status: DC | PRN
  Administered 2020-09-22: 650 mg via ORAL
  Filled 2020-09-17: qty 2

## 2020-09-17 MED ORDER — HALOPERIDOL LACTATE 5 MG/ML IJ SOLN: 5.0000 mg | Freq: Four times a day (QID) | INTRAMUSCULAR | Status: DC | PRN

## 2020-09-17 MED ORDER — HYDROXYZINE HCL 50 MG PO TABS
50.0000 mg | ORAL_TABLET | Freq: Three times a day (TID) | ORAL | Status: DC | PRN
  Administered 2020-09-17 – 2020-09-22 (×2): 50 mg via ORAL
  Filled 2020-09-17 (×2): qty 1

## 2020-09-17 MED ORDER — OLANZAPINE 5 MG PO TBDP
20.0000 mg | ORAL_TABLET | Freq: Every day | ORAL | Status: DC
  Administered 2020-09-17 – 2020-09-18 (×2): 20 mg via ORAL
  Filled 2020-09-17 (×2): qty 4

## 2020-09-17 MED ORDER — MAGNESIUM HYDROXIDE 400 MG/5ML PO SUSP: 30.0000 mL | Freq: Every day | ORAL | Status: DC | PRN

## 2020-09-17 MED ORDER — OLANZAPINE 10 MG IM SOLR: 30.0000 mg | Freq: Every evening | INTRAMUSCULAR | Status: DC | PRN

## 2020-09-17 NOTE — ED Notes (Signed)
Hourly rounding reveals patient in room. No complaints, stable, in no acute distress. Q15 minute rounds and monitoring via Security Cameras to continue. 

## 2020-09-17 NOTE — BH Assessment (Addendum)
Patient can come down after 2pm pending negative COVID  Call to give report: [phone]  Patient is to be admitted to [facility] by Dr. Neale Burly.  Attending Physician will be. Dr. Neale Burly.   Patient has been assigned to room 310, by [facility] Charge Nurse Maryelizabeth Kaufmann, RN.   Intake Paper Work has been signed and placed on patient chart.  ER staff is aware of the admission: 1. Rivka Barbara, ER Secretary  2. Quale, ER MD  3. Amy T, Patient's Nurse  4. Tho, Patient Access

## 2020-09-17 NOTE — Progress Notes (Deleted)
Pt has been alert and oriented to person, place, time and situation. Pt is calm, cooperative, denies suicidal and homicidal ideation, denies hallucinations, denies anxiety and depression. Pt has been visible on the unit, pleasant, affect is bright, reports his mood is, "great." Pt attempts to "high five" staff, and "elbow bump." Pt is social and friendly with staff and peers. Pt's mood is noted to be elevated. No signs of aggression noted. Pt reports he slept "okay" and pt's appetite is good. Will continue to monitor pt per Q15 minute face checks and monitor for safety and progress.  

## 2020-09-17 NOTE — BH Assessment (Signed)
Confirmed with [facility]([phone]), patientremainson their Waitlist. 

## 2020-09-17 NOTE — ED Notes (Signed)
EDP at his bedside  No verbalized needs or concerns at this time

## 2020-09-17 NOTE — Consult Note (Signed)
[facility] Face-to-Face Psychiatry Consult   Reason for Consult:   Follow-up for this patient with schizoaffective disorder who has been in the emergency room for an extended period of time awaiting appropriate admission Referring Physician: Quale Patient Identification: Jackson [name] MRN:  [id number] Principal Diagnosis: Schizoaffective disorder, bipolar type (HCC) Diagnosis:  Principal Problem:   Schizoaffective disorder, bipolar type (HCC)   Total Time spent with patient: 30 minutes  Subjective:   Jackson [name] is a 35 y.o. male patient admitted with "I have learned my lesson".  HPI: Patient seen chart reviewed.  See all previous notes.  35 year old man with schizoaffective disorder who has been in the emergency room now for more than a couple weeks awaiting acceptance to appropriate inpatient room.  He continues to have some periods of lability although this is improved quite a bit over the last week.  He still is only intermittently compliant with medicine frequently referring prescribed medication.  Insight remains partial at best.  Patient is denying suicidal or homicidal thought.  Not aggressive not threatening.  He has been advised of the plan to admit him to the psychiatric ward and is currently agreeable  Past Psychiatric History: Longstanding schizoaffective disorder several prior admissions including lengthy hospitalizations in the past often made more complicated by his noncompliance  Risk to Self:   Risk to Others:   Prior Inpatient Therapy:   Prior Outpatient Therapy:    Past Medical History:  Past Medical History:  Diagnosis Date  . Bipolar 1 disorder (HCC)   . Schizoaffective disorder (HCC)    History reviewed. No pertinent surgical history. Family History: History reviewed. No pertinent family history. Family Psychiatric  History: See previous Social History:  Social History   Substance and Sexual Activity  Alcohol Use Not Currently     Social History    Substance and Sexual Activity  Drug Use Not Currently    Social History   Socioeconomic History  . Marital status: Single    Spouse name: Not on file  . Number of children: Not on file  . Years of education: Not on file  . Highest education level: Not on file  Occupational History  . Not on file  Tobacco Use  . Smoking status: Light Tobacco Smoker    Packs/day: 0.25    Types: Cigarettes  . Smokeless tobacco: Never Used  Vaping Use  . Vaping Use: Never used  Substance and Sexual Activity  . Alcohol use: Not Currently  . Drug use: Not Currently  . Sexual activity: Not on file  Other Topics Concern  . Not on file  Social History Narrative  . Not on file   Social Determinants of Health   Financial Resource Strain: Not on file  Food Insecurity: Not on file  Transportation Needs: Not on file  Physical Activity: Not on file  Stress: Not on file  Social Connections: Not on file   Additional Social History:    Allergies:  No Known Allergies  Labs: No results found for this or any previous visit (from the past 48 hour(s)).  Current Facility-Administered Medications  Medication Dose Route Frequency Provider Last Rate Last Admin  . diphenhydrAMINE (BENADRYL) injection 50 mg  50 mg Intramuscular Q6H PRN [name], [name], MD   50 mg at 08/30/20 0545  . haloperidol lactate (HALDOL) injection 5 mg  5 mg Intramuscular Q6H PRN [name], [name], MD   5 mg at 08/30/20 0545  . hydrOXYzine (ATARAX/VISTARIL) tablet 50 mg  50 mg Oral TID  PRN Sharman Cheek, MD   50 mg at 09/16/20 2333  . LORazepam (ATIVAN) tablet 2 mg  2 mg Oral Q4H PRN [name], [name], MD   2 mg at 09/16/20 2334   Or  . LORazepam (ATIVAN) injection 2 mg  2 mg Intramuscular Q4H PRN [name], [name], MD   2 mg at 08/30/20 0544  . OLANZapine (ZYPREXA) injection 30 mg  30 mg Intramuscular QHS PRN [name], [name], MD      . OLANZapine zydis (ZYPREXA) disintegrating tablet 20 mg  20 mg Oral QHS [name], [name], MD   20  mg at 09/16/20 2245  . valproic acid (DEPAKENE) 250 MG/5ML solution 1,000 mg  1,000 mg Oral QHS [name], [name], MD   1,000 mg at 09/14/20 2117   Current Outpatient Medications  Medication Sig Dispense Refill  . ARIPiprazole ER (ABILIFY MAINTENA) 400 MG SRER injection Inject 2 mLs (400 mg total) into the muscle every 28 (twenty-eight) days. 1 each 1  . divalproex (DEPAKOTE ER) 500 MG 24 hr tablet Take 1,000 mg by mouth daily.    . traZODone (DESYREL) 100 MG tablet Take 1 tablet (100 mg total) by mouth at bedtime as needed for sleep. 30 tablet 1    Musculoskeletal: Strength & Muscle Tone: within normal limits Gait & Station: normal Patient leans: N/A  Psychiatric Specialty Exam: Physical Exam Vitals and nursing note reviewed.  Constitutional:      Appearance: He is well-developed and well-nourished.  HENT:     Head: Normocephalic and atraumatic.  Eyes:     Conjunctiva/sclera: Conjunctivae normal.     Pupils: Pupils are equal, round, and reactive to light.  Cardiovascular:     Heart sounds: Normal heart sounds.  Pulmonary:     Effort: Pulmonary effort is normal.  Abdominal:     Palpations: Abdomen is soft.  Musculoskeletal:        General: Normal range of motion.     Cervical back: Normal range of motion.  Skin:    General: Skin is warm and dry.  Neurological:     General: No focal deficit present.     Mental Status: He is alert.  Psychiatric:        Mood and Affect: Mood normal.     Review of Systems  Constitutional: Negative.   HENT: Negative.   Eyes: Negative.   Respiratory: Negative.   Cardiovascular: Negative.   Gastrointestinal: Negative.   Musculoskeletal: Negative.   Skin: Negative.   Neurological: Negative.   Psychiatric/Behavioral: Negative.     Blood pressure 107/70, pulse 79, temperature (!) 97.4 F (36.3 C), temperature source Oral, resp. rate 18, height 5\' 11"  (1.803 m), weight 88 kg, SpO2 98 %.Body mass index is 27.06 kg/m.  General Appearance:  Casual  Eye Contact:  Fair  Speech:  Clear and Coherent  Volume:  Normal  Mood:  Euthymic  Affect:  Congruent  Thought Process:  Goal Directed  Orientation:  Full (Time, Place, and Person)  Thought Content:  Logical and Rumination  Suicidal Thoughts:  No  Homicidal Thoughts:  No  Memory:  Immediate;   Fair Recent;   Fair Remote;   Fair  Judgement:  Fair  Insight:  Fair  Psychomotor Activity:  Normal  Concentration:  Concentration: Fair  Recall:  of Knowledge:  Fair  Language:  Fair  Akathisia:  No  Handed:  Right  AIMS (if indicated):     Assets:  Desire for Improvement  ADL's:  Intact  Cognition:  WNL  Sleep:        Treatment Plan Summary: Plan Patient had [facility] but we have been made to understand by their administrators that because of their short staffing this is not likely to happen anytime in the foreseeable future.  Therefore we are hoping that we can now admit him to the psychiatric ward and manage his needs there.  Orders completed for admission.  Case reviewed with inpatient team and TTS.  Disposition: Recommend psychiatric Inpatient admission when medically cleared. Supportive therapy provided about ongoing stressors.  Mordecai Rasmussen, MD 09/17/2020 2:39 PM
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
## 2020-09-17 NOTE — ED Provider Notes (Signed)
Emergency Medicine Observation Re-evaluation Note  Jackson [name] is a 35 y.o. male, seen on rounds today.  Pt initially presented to the ED for complaints of Psychiatric Evaluation Currently, the patient is sleeping comfortably.  He easily alerts to voice, sits up and is very pleasant.  He has no concerns at this time.  Physical Exam  BP 107/70   Pulse 79   Temp (!) 97.4 F (36.3 C) (Oral)   Resp 18   Ht 5\' 11"  (1.803 m)   Wt 88 kg   SpO2 98%   BMI 27.06 kg/m  Physical Exam Constitutional:      Appearance: He is not ill-appearing or toxic-appearing.  HENT:     Head: Atraumatic.  Cardiovascular:     Comments: Well perfused Pulmonary:     Effort: Pulmonary effort is normal.  Abdominal:     General: There is no distension.  Musculoskeletal:        General: No deformity.  Skin:    Findings: No rash.  Neurological:     General: No focal deficit present.     Cranial Nerves: No cranial nerve deficit.      ED Course / MDM  EKG:I have reviewed the labs performed to date as well as medications administered while in observation.  Recent changes in the last 24 hours include none.  Plan  Current plan is for inpatient psychiatric care. Patient is under full IVC at this time.   , MD 09/16/20 09/18/20    [id number], MD 09/17/20 [phone]

## 2020-09-17 NOTE — Progress Notes (Signed)
Received report from Amy, RN. Informed Amy, RN in ED that Dr. Neale Burly, MD is requesting a negative "current" result for covid prior to admission to BMU. Amy, RN reports she is going to reach out to Dr. Toni Amend, MD for this and admitting orders and complete this order prior to sending pt to unit and will call unit to let us know results.

## 2020-09-17 NOTE — ED Notes (Signed)
VS not taken, Patient asleep. 

## 2020-09-17 NOTE — Tx Team (Signed)
Initial Treatment Plan 09/17/2020 6:15 PM Jackson [name] YEL:[id number]    PATIENT STRESSORS: Financial difficulties Medication change or noncompliance   PATIENT STRENGTHS: Average or above average intelligence Communication skills Supportive family/friends   PATIENT IDENTIFIED PROBLEMS: Aggressive behavior   Non compliant with medications                   DISCHARGE CRITERIA:  Ability to meet basic life and health needs Improved stabilization in mood, thinking, and/or behavior Medical problems require only outpatient monitoring Verbal commitment to aftercare and medication compliance  PRELIMINARY DISCHARGE PLAN: Attend aftercare/continuing care group Return to previous living arrangement  PATIENT/FAMILY INVOLVEMENT: This treatment plan has been presented to and reviewed with the patient, Jackson [name], and/or family member,.  The patient and family have been given the opportunity to ask questions and make suggestions.  Leonarda Salon, RN 09/17/2020, 6:15 PM

## 2020-09-17 NOTE — BHH Group Notes (Signed)
[facility] Group Notes:  (Nursing/MHT/Case Management/Adjunct)  Date:  09/17/2020  Time:  9:02 PM  Type of Therapy:  Group Therapy  Participation Level:  Active  Participation Quality:  Appropriate  Affect:  Appropriate  Cognitive:  Alert  Insight:  Good  Engagement in Group:  Engaged and talking to Rayne out loud in group.  Modes of Intervention:  Support  Summary of Progress/Problems:  [name] 09/17/2020, 9:02 PM

## 2020-09-18 DIAGNOSIS — F250 Schizoaffective disorder, bipolar type: Principal | ICD-10-CM

## 2020-09-18 MED ORDER — ARIPIPRAZOLE 10 MG PO TABS
10.0000 mg | ORAL_TABLET | Freq: Every day | ORAL | Status: DC
  Filled 2020-09-18: qty 1

## 2020-09-18 NOTE — Progress Notes (Signed)
Patient is pleasant and cooperative this evening. He has been active hanging out in the milieu on the unit and gets along well with other male patients on the unit similar in age.  He has been using his journal writing poems and songs which also keep him occupied. He refused his liquid Depakote this evening stating that it keeps him from thinking clearly, but he was compliant with all other medications provided and tolerated them without incident. He denies SI  HI  AVH depression anxiety and pain this evening and no signs or  symptoms were observed. He is safe on the unit and with 15 minute safety rounds and encouraged to come to staff without incident.     Cleo Butler-Nicholson, LPN

## 2020-09-18 NOTE — BHH Group Notes (Signed)
LCSW Group Therapy Note  09/18/2020 2:17 PM  Type of Therapy/Topic:  Group Therapy:  Feelings about Diagnosis  Participation Level:  Active   Description of Group:   This group will allow patients to explore their thoughts and feelings about diagnoses they have received. Patients will be guided to explore their level of understanding and acceptance of these diagnoses. Facilitator will encourage patients to process their thoughts and feelings about the reactions of others to their diagnosis and will guide patients in identifying ways to discuss their diagnosis with significant others in their lives. This group will be process-oriented, with patients participating in exploration of their own experiences, giving and receiving support, and processing challenge from other group members.   Therapeutic Goals: 1. Patient will demonstrate understanding of diagnosis as evidenced by identifying two or more symptoms of the disorder 2. Patient will be able to express two feelings regarding the diagnosis 3. Patient will demonstrate their ability to communicate their needs through discussion and/or role play  Summary of Patient Progress: Patient was present for the entirety of the group. He spoke at length about feeling that diagnoses are just labels and that he hates labels. Patient brings up lots of different issues including sexuality, race, occupation and denies believing that there is anything such as mental health disorders but that people are just going through different situations. His comments monopolized the group discussion and after another peer commented and encouraged him to allow others to participate, pt left the room stating that he was just trying to provide love.   Therapeutic Modalities:   Cognitive Behavioral Therapy Brief Therapy Feelings Identification   Alaila Pillard R. Algis Greenhouse, MSW, LCSW, LCAS 09/18/2020 2:17 PM
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
## 2020-09-18 NOTE — Plan of Care (Signed)
Patient made good eye contact with this Clinical research associate during assesment  Problem: Coping: Goal: Ability to use eye contact when communicating with others will improve Outcome: Progressing

## 2020-09-18 NOTE — Progress Notes (Signed)
Patient calm and cooperative during assessment denying SI/HI/AVH. Patient presents with delusions thinking he can stop time. Patient is hyper-religious. Patient observed interacting appropriately with staff and peers on the unit. Patient compliant with medication administration per MD orders. Patient given education, support, and encouragement to be active in his treatment plan. Patient doesn't think he should be here or on medications. Patient given education. Patient being monitored Q 15 minutes for safety per unit protocol. Pt remains safe on the unit.

## 2020-09-18 NOTE — Progress Notes (Signed)
Patient moved to room 303 because of the patient in room 309 and her paranoia about this patient. This patient didn't do anything wrong, this writer just felt it would be easier on everyone involved if his room was moved away from the patient in 102.
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
## 2020-09-18 NOTE — Plan of Care (Signed)
  Problem: Health Behavior/Discharge Planning: Goal: Compliance with treatment plan for underlying cause of condition will improve Outcome: Progressing   Problem: Coping: Goal: Ability to identify and develop effective coping behavior will improve Outcome: Progressing Goal: Ability to interact with others will improve Outcome: Progressing Goal: Demonstration of participation in decision-making regarding own care will improve Outcome: Progressing Goal: Ability to use eye contact when communicating with others will improve Outcome: Progressing   Problem: Self-Concept: Goal: Will verbalize positive feelings about self Outcome: Progressing

## 2020-09-18 NOTE — Progress Notes (Signed)
Pt has been alert and oriented to person, place, time and situation. Pt is calm, cooperative, denies suicidal and homicidal ideation, denies hallucinations, denies anxiety and depression. Pt has been visible on the unit, pleasant, affect is bright, reports his mood is, "great." Pt attempts to "high five" staff, and "elbow bump." Pt is social and friendly with staff and peers. Pt's mood is noted to be elevated. No signs of aggression noted. Pt reports he slept "okay" and pt's appetite is good. Will continue to monitor pt per Q15 minute face checks and monitor for safety and progress.

## 2020-09-19 MED ORDER — OLANZAPINE 5 MG PO TBDP
30.0000 mg | ORAL_TABLET | Freq: Every evening | ORAL | Status: DC
  Administered 2020-09-19: 30 mg via ORAL
  Filled 2020-09-19: qty 6

## 2020-09-19 NOTE — Progress Notes (Signed)
Patient calm and compliant with procedures on the unit. Patient denies SI/HI/AVH. Patient didn't have any medications scheduled this evening and hasn't requested anything PRN. Patient observed interacting appropriately with staff and peers on the unit. Patient given education, support, and encouragement to be active in his treatment plan. Patient being monitored Q 15 minutes for safety per unit protocol. Pt remains safe on the unit.

## 2020-09-19 NOTE — [facility] Group Notes (Signed)
[facility] Group Notes:  (Nursing/MHT/Case Management/Adjunct)  Date:  09/19/2020  Time:  9:46 PM  Type of Therapy:  Group Therapy  Participation Level:  Active  Participation Quality:  Appropriate  Affect:  Appropriate  Cognitive:  Alert  Insight:  Good  Engagement in Group:  Engaged and said he accomplish some personal goal today and that he didn't want to talk about.  Modes of Intervention:  Activity  Summary of Progress/Problems:  Jackson [name] 09/19/2020, 9:46 PM

## 2020-09-19 NOTE — Plan of Care (Signed)
Patient was compliant on day shift with his medication administration per MD orders  Problem: Health Behavior/Discharge Planning: Goal: Compliance with treatment plan for underlying cause of condition will improve Outcome: Progressing

## 2020-09-19 NOTE — [facility] Group Notes (Signed)
LCSW Group Therapy Note  09/19/2020 2:05 PM  Type of Therapy/Topic:  Group Therapy:  Emotion Regulation  Participation Level:  Active   Description of Group:   The purpose of this group is to assist patients in learning to regulate negative emotions and experience positive emotions. Patients will be guided to discuss ways in which they have been vulnerable to their negative emotions. These vulnerabilities will be juxtaposed with experiences of positive emotions or situations, and patients will be challenged to use positive emotions to combat negative ones. Special emphasis will be placed on coping with negative emotions in conflict situations, and patients will process healthy conflict resolution skills.  Therapeutic Goals: 1. Patient will identify two positive emotions or experiences to reflect on in order to balance out negative emotions 2. Patient will label two or more emotions that they find the most difficult to experience 3. Patient will demonstrate positive conflict resolution skills through discussion and/or role plays  Summary of Patient Progress: Patient was present for group. Patient was an active participant.  Patient engaged in discussion on forgiveness specifically. Patient shared that he does not struggle with exception for his mother.  He was interactive and wanted information on how to address their reported conflictual relationship and forgive some of his mother's past actions. Patient did have flight of ideas and somewhat rapid speech and often had to be redirected.    Therapeutic Modalities:   Cognitive Behavioral Therapy Feelings Identification Dialectical Behavioral Therapy  Penni Homans, MSW, LCSW 09/19/2020 2:05 PM

## 2020-09-19 NOTE — Plan of Care (Signed)
Pt denies depression, anxiety, SI, HI and AVH. Pt was educated on care plan and verbalizes understanding. Torrie Mayers RN Problem: Education: Goal: Charity fundraiser Education information/materials will improve Outcome: Progressing   Problem: Health Behavior/Discharge Planning: Goal: Identification of resources available to assist in meeting health care needs will improve Outcome: Progressing Goal: Compliance with treatment plan for underlying cause of condition will improve Outcome: Not Progressing   Problem: Coping: Goal: Ability to identify and develop effective coping behavior will improve Outcome: Progressing Goal: Ability to interact with others will improve Outcome: Progressing Goal: Demonstration of participation in decision-making regarding own care will improve Outcome: Progressing Goal: Ability to use eye contact when communicating with others will improve Outcome: Progressing   Problem: Self-Concept: Goal: Will verbalize positive feelings about self Outcome: Progressing

## 2020-09-19 NOTE — Progress Notes (Signed)
[facility] MD Progress Note  09/19/2020 12:03 PM Jackson [name]  MRN:  [id number]   CC: "This is just me."  Subjective:  35 year old male with schizoaffective disorder, bipolar type who presented to the emergency room on Jan 8th due to mania and increased aggression. No acute events overnight. Continues to be selectively compliant with Zyprexa and no other medications. Attending to ADLs.  Patient seen during treatment team and again one-on-one. He states his goals are to go home, and get his guardianship back. He continues to exhibit flight of ideas, disorganized thought process, and rumination over his poor relationship with mother. He continues to feel like psychiatric medications such as Abilify and Depakote increase in aggression, and cause mental instability. He also cites weight gain, sedation, gynecomastia, and excess drooling as side effects to other past medications including Risperdal and Invega. Attempts made to have him agree to Jordan or Geodon as more weight neutral antipsychotic options, but this was unsuccessful. He does, however, agree to increase Zyprexa at bedtime. He agrees that sleeping better at night does assist him to control his temper during the day.   Contacted his guardian, Jackson [name], [phone]. Voicemail left to notify of dose increase. Call back number provided.   Principal Problem: Schizoaffective disorder, bipolar type (HCC) Diagnosis: Principal Problem:   Schizoaffective disorder, bipolar type (HCC) Active Problems:   Noncompliance  Total Time spent with patient: 1 hour  Past Psychiatric History: See H&P  Past Medical History:  Past Medical History:  Diagnosis Date  . Bipolar 1 disorder (HCC)   . Schizoaffective disorder (HCC)    History reviewed. No pertinent surgical history. Family History: History reviewed. No pertinent family history. Family Psychiatric  History: See H&P Social History:  Social History   Substance and Sexual Activity   Alcohol Use Not Currently     Social History   Substance and Sexual Activity  Drug Use Not Currently    Social History   Socioeconomic History  . Marital status: Single    Spouse name: Not on file  . Number of children: Not on file  . Years of education: Not on file  . Highest education level: Not on file  Occupational History  . Not on file  Tobacco Use  . Smoking status: Light Tobacco Smoker    Packs/day: 0.25    Types: Cigarettes  . Smokeless tobacco: Never Used  Vaping Use  . Vaping Use: Never used  Substance and Sexual Activity  . Alcohol use: Not Currently  . Drug use: Not Currently  . Sexual activity: Not on file  Other Topics Concern  . Not on file  Social History Narrative  . Not on file   Social Determinants of Health   Financial Resource Strain: Not on file  Food Insecurity: Not on file  Transportation Needs: Not on file  Physical Activity: Not on file  Stress: Not on file  Social Connections: Not on file   Additional Social History:                         Sleep: Fair  Appetite:  Fair  Current Medications: Current Facility-Administered Medications  Medication Dose Route Frequency Provider Last Rate Last Admin  . acetaminophen (TYLENOL) tablet 650 mg  650 mg Oral Q6H PRN [name], [name], MD      . alum & mag hydroxide-simeth (MAALOX/MYLANTA) 200-200-20 MG/5ML suspension 30 mL  30 mL Oral Q4H PRN [name], [name], MD      .  ARIPiprazole (ABILIFY) tablet 10 mg  10 mg Oral Daily [name], MD      . diphenhydrAMINE (BENADRYL) injection 50 mg  50 mg Intramuscular Q6H PRN [name], [name], MD      . haloperidol lactate (HALDOL) injection 5 mg  5 mg Intramuscular Q6H PRN [name], [name], MD      . hydrOXYzine (ATARAX/VISTARIL) tablet 50 mg  50 mg Oral TID PRN [name], [name], MD   50 mg at 09/17/20 2118  . LORazepam (ATIVAN) tablet 2 mg  2 mg Oral Q4H PRN [name], [name], MD   2 mg at 09/18/20 2139   Or  . LORazepam (ATIVAN) injection  2 mg  2 mg Intramuscular Q4H PRN [name], [name], MD      . magnesium hydroxide (MILK OF MAGNESIA) suspension 30 mL  30 mL Oral Daily PRN [name], [name], MD      . OLANZapine (ZYPREXA) injection 30 mg  30 mg Intramuscular QHS PRN [name], [name], MD      . OLANZapine zydis (ZYPREXA) disintegrating tablet 30 mg  30 mg Oral QPM [name], MD        Lab Results:  Results for orders placed or performed during the hospital encounter of 08/11/20 (from the past 48 hour(s))  Resp Panel by RT-PCR (Flu A&B, Covid) Nasopharyngeal Swab     Status: None   Collection Time: 09/17/20  2:36 PM   Specimen: Nasopharyngeal Swab; Nasopharyngeal(NP) swabs in vial transport medium  Result Value Ref Range   SARS Coronavirus 2 by RT PCR NEGATIVE NEGATIVE    Comment: (NOTE) SARS-CoV-2 target nucleic acids are NOT DETECTED.  The SARS-CoV-2 RNA is generally detectable in upper respiratory specimens during the acute phase of infection. The lowest concentration of SARS-CoV-2 viral copies this assay can detect is 138 copies/mL. A negative result does not preclude SARS-Cov-2 infection and should not be used as the sole basis for treatment or other patient management decisions. A negative result may occur with  improper specimen collection/handling, submission of specimen other than nasopharyngeal swab, presence of viral mutation(s) within the areas targeted by this assay, and inadequate number of viral copies(<138 copies/mL). A negative result must be combined with clinical observations, patient history, and epidemiological information. The expected result is Negative.  Fact Sheet for Patients:  BloggerCourse.com  Fact Sheet for Healthcare Providers:  SeriousBroker.it  This test is no t yet approved or cleared by the [place] FDA and  has been authorized for detection and/or diagnosis of SARS-CoV-2 by FDA under an Emergency Use Authorization (EUA).  This EUA will remain  in effect (meaning this test can be used) for the duration of the COVID-19 declaration under Section 564(b)(1) of the Act, 21 U.S.C.section 360bbb-3(b)(1), unless the authorization is terminated  or revoked sooner.       Influenza A by PCR NEGATIVE NEGATIVE   Influenza B by PCR NEGATIVE NEGATIVE    Comment: (NOTE) The Xpert Xpress SARS-CoV-2/FLU/RSV plus assay is intended as an aid in the diagnosis of influenza from Nasopharyngeal swab specimens and should not be used as a sole basis for treatment. Nasal washings and aspirates are unacceptable for Xpert Xpress SARS-CoV-2/FLU/RSV testing.  Fact Sheet for Patients: BloggerCourse.com  Fact Sheet for Healthcare Providers: SeriousBroker.it  This test is not yet approved or cleared by the [place] FDA and has been authorized for detection and/or diagnosis of SARS-CoV-2 by FDA under an Emergency Use Authorization (EUA). This EUA will remain in  effect (meaning this test can be used) for the duration of the COVID-19 declaration under Section 564(b)(1) of the Act, 21 U.S.C. section 360bbb-3(b)(1), unless the authorization is terminated or revoked.  Performed at [facility], [address] [place], [place] [address]     Blood Alcohol level:  Lab Results  Component Value Date   [facility] 08/[facility]2020   ETH <10 04/29/2020    Metabolic Disorder Labs: Lab Results  Component Value Date   HGBA1C 5.2 10/23/2018   MPG 102.54 10/23/2018   No results found for: PROLACTIN Lab Results  Component Value Date   CHOL 129 10/23/2018   TRIG 58 10/23/2018   HDL 50 10/23/2018   CHOLHDL 2.6 10/23/2018   VLDL 12 10/23/2018   LDLCALC 67 10/23/2018    Physical Findings: AIMS:  , ,  ,  ,    CIWA:    COWS:     Musculoskeletal: Strength & Muscle Tone: within normal limits Gait & Station: normal Patient leans: N/A  Psychiatric Specialty Exam: Physical  Exam Vitals and nursing note reviewed.  Constitutional:      Appearance: Normal appearance.  HENT:     Head: Normocephalic and atraumatic.     Right Ear: External ear normal.     Left Ear: External ear normal.     Nose: Nose normal.     Mouth/Throat:     Mouth: Mucous membranes are moist.     Pharynx: Oropharynx is clear.  Eyes:     Extraocular Movements: Extraocular movements intact.     Conjunctiva/sclera: Conjunctivae normal.     Pupils: Pupils are equal, round, and reactive to light.  Cardiovascular:     Rate and Rhythm: Normal rate.     Pulses: Normal pulses.  Pulmonary:     Effort: Pulmonary effort is normal.     Breath sounds: Normal breath sounds.  Abdominal:     General: Abdomen is flat.     Palpations: Abdomen is soft.  Musculoskeletal:        General: No swelling. Normal range of motion.     Cervical back: Normal range of motion and neck supple.  Skin:    General: Skin is warm and dry.  Neurological:     General: No focal deficit present.     Mental Status: He is alert and oriented to person, place, and time.  Psychiatric:        Attention and Perception: He is inattentive. He does not perceive auditory or visual hallucinations.        Mood and Affect: Affect normal. Mood is depressed.        Speech: Speech is rapid and pressured.        Behavior: Behavior is hyperactive.        Thought Content: Thought content is paranoid. Thought content does not include homicidal or suicidal ideation.        Cognition and Memory: Cognition and memory normal.        Judgment: Judgment is impulsive.     Review of Systems  Constitutional: Positive for appetite change and fatigue.  HENT: Negative for rhinorrhea and sore throat.   Eyes: Negative for photophobia and visual disturbance.  Respiratory: Negative for cough and shortness of breath.   Cardiovascular: Negative for chest pain and palpitations.  Gastrointestinal: Negative for constipation, diarrhea, nausea and vomiting.   Endocrine: Negative for cold intolerance and heat intolerance.  Genitourinary: Negative for difficulty urinating and dysuria.  Musculoskeletal: Negative for arthralgias and myalgias.  Skin: Negative for rash and wound.  Allergic/Immunologic: Negative for environmental allergies and food allergies.  Neurological: Negative for dizziness and headaches.  Hematological: Negative for adenopathy. Does not bruise/bleed easily.  Psychiatric/Behavioral: Positive for decreased concentration, dysphoric mood and sleep disturbance. The patient is hyperactive.     Blood pressure 118/83, pulse 84, temperature 98.7 F (37.1 C), temperature source Oral, resp. rate 17, height 6' (1.829 m), weight 111.1 kg, SpO2 99 %.Body mass index is 33.23 kg/m.  General Appearance: Fairly Groomed  Eye Contact:  Good  Speech:  Pressured  Volume:  Increased  Mood:  Irritable  Affect:  Congruent  Thought Process:  Disorganized  Orientation:  Full (Time, Place, and Person)  Thought Content:  Rumination and Tangential  Suicidal Thoughts:  No  Homicidal Thoughts:  No  Memory:  Immediate;   Fair Recent;   Fair Remote;   Fair  Judgement:  Impaired  Insight:  Lacking  Psychomotor Activity:  Normal  Concentration:  Concentration: Poor  Recall:  [employer] of Knowledge:  Fair  Language:  Fair  Akathisia:  Negative  Handed:  Right  AIMS (if indicated):     Assets:  Communication Skills Desire for Improvement Financial Resources/Insurance Housing  ADL's:  Intact  Cognition:  Impaired,  Mild  Sleep:  Number of Hours: 5.25     Treatment Plan Summary: Daily contact with patient to assess and evaluate symptoms and progress in treatment and Medication management  35 yo male with schizoaffective disorder, bipolar type. Currently still exhibiting flight of ideas, lack of insight, and irritability. Patient agreeable to increase in Zyprexa, but not to starting back Abilify. Increase Zyprexa 30 mg QHS. Will continue to try  and foster insight into mental illness, and add a second agent tomorrow. Currently does not meet criteria for non-emergent forced medications.    [name], MD 09/19/2020, 12:03 PM

## 2020-09-19 NOTE — Progress Notes (Signed)
Patient came up to the nurses station to request something PRN for anxiety, check MAR. Once in the medication room with the patient, patient presented hyper religious, delusional, and manic with pressured speech.

## 2020-09-19 NOTE — Progress Notes (Signed)
Recreation Therapy Notes  Date: 09/19/2020  Time: 9:30 am   Location: Craft room     Behavioral response: N/A   Intervention Topic: Goals   Discussion/Intervention: Patient did not attend group.   Clinical Observations/Feedback:  Patient did not attend group.   [name] LRT/CTRS        Jackson [name] 09/19/2020 11:34 AM 

## 2020-09-19 NOTE — Progress Notes (Signed)
Pt has been calm, cooperative and social today. Pt went to a groups. Pt complained of being drowsy today. Pt is still having grandeur dellusions and does not think that he needs to be a patient here. Pt refused his first medication of the day however he took his evening med. Torrie Mayers RN

## 2020-09-19 NOTE — Tx Team (Addendum)
Interdisciplinary Treatment and Diagnostic Plan Update  09/19/2020 Time of Session: 09:00 Jackson [name] MRN: [id number]  Principal Diagnosis: Schizoaffective disorder, bipolar type [facility])  Secondary Diagnoses: Principal Problem:   Schizoaffective disorder, bipolar type ([place]) Active Problems:   Noncompliance   Current Medications:  Current Facility-Administered Medications  Medication Dose Route Frequency Provider Last Rate Last Admin  . acetaminophen (TYLENOL) tablet 650 mg  650 mg Oral Q6H PRN [name], [name], MD      . alum & mag hydroxide-simeth (MAALOX/MYLANTA) 200-200-20 MG/5ML suspension 30 mL  30 mL Oral Q4H PRN [name], [name], MD      . ARIPiprazole (ABILIFY) tablet 10 mg  10 mg Oral Daily Salley Scarlet, MD      . diphenhydrAMINE (BENADRYL) injection 50 mg  50 mg Intramuscular Q6H PRN [name], [name], MD      . haloperidol lactate (HALDOL) injection 5 mg  5 mg Intramuscular Q6H PRN [name], [name], MD      . hydrOXYzine (ATARAX/VISTARIL) tablet 50 mg  50 mg Oral TID PRN [name], Madie Reno, MD   50 mg at 09/17/20 2118  . LORazepam (ATIVAN) tablet 2 mg  2 mg Oral Q4H PRN [name], Madie Reno, MD   2 mg at 09/18/20 2139   Or  . LORazepam (ATIVAN) injection 2 mg  2 mg Intramuscular Q4H PRN [name], [name], MD      . magnesium hydroxide (MILK OF MAGNESIA) suspension 30 mL  30 mL Oral Daily PRN [name], [name], MD      . OLANZapine (ZYPREXA) injection 30 mg  30 mg Intramuscular QHS PRN [name], [name], MD      . OLANZapine zydis (ZYPREXA) disintegrating tablet 20 mg  20 mg Oral QHS [name], [name], MD   20 mg at 09/18/20 2136   PTA Medications: Medications Prior to Admission  Medication Sig Dispense Refill Last Dose  . ARIPiprazole ER (ABILIFY MAINTENA) 400 MG SRER injection Inject 2 mLs (400 mg total) into the muscle every 28 (twenty-eight) days. 1 each 1   . divalproex (DEPAKOTE ER) 500 MG 24 hr tablet Take 1,000 mg by mouth daily.     . traZODone (DESYREL) 100 MG tablet Take 1  tablet (100 mg total) by mouth at bedtime as needed for sleep. 30 tablet 1     Patient Stressors: Financial difficulties Medication change or noncompliance  Patient Strengths: Average or above average intelligence Communication skills Supportive family/friends  Treatment Modalities: Medication Management, Group therapy, Case management,  1 to 1 session with clinician, Psychoeducation, Recreational therapy.   Physician Treatment Plan for Primary Diagnosis: Schizoaffective disorder, bipolar type ([place]) Long Term Goal(s): Improvement in symptoms so as ready for discharge Improvement in symptoms so as ready for discharge   Short Term Goals: Ability to identify changes in lifestyle to reduce recurrence of condition will improve Ability to verbalize feelings will improve Ability to disclose and discuss suicidal ideas Ability to demonstrate self-control will improve Ability to identify and develop effective coping behaviors will improve Ability to maintain clinical measurements within normal limits will improve Compliance with prescribed medications will improve Ability to identify changes in lifestyle to reduce recurrence of condition will improve Ability to verbalize feelings will improve Ability to disclose and discuss suicidal ideas Ability to demonstrate self-control will improve Ability to identify and develop effective coping behaviors will improve Ability to maintain clinical measurements within normal limits will improve Compliance with prescribed medications will improve  Medication Management: Evaluate patient's response, side  effects, and tolerance of medication regimen.  Therapeutic Interventions: 1 to 1 [place], Unit Group [place] and Medication administration.  Evaluation of Outcomes: Not Met  Physician Treatment Plan for Secondary Diagnosis: Principal Problem:   Schizoaffective disorder, bipolar type ([place]) Active Problems:   Noncompliance  Long Term Goal(s):  Improvement in symptoms so as ready for discharge Improvement in symptoms so as ready for discharge   Short Term Goals: Ability to identify changes in lifestyle to reduce recurrence of condition will improve Ability to verbalize feelings will improve Ability to disclose and discuss suicidal ideas Ability to demonstrate self-control will improve Ability to identify and develop effective coping behaviors will improve Ability to maintain clinical measurements within normal limits will improve Compliance with prescribed medications will improve Ability to identify changes in lifestyle to reduce recurrence of condition will improve Ability to verbalize feelings will improve Ability to disclose and discuss suicidal ideas Ability to demonstrate self-control will improve Ability to identify and develop effective coping behaviors will improve Ability to maintain clinical measurements within normal limits will improve Compliance with prescribed medications will improve     Medication Management: Evaluate patient's response, side effects, and tolerance of medication regimen.  Therapeutic Interventions: 1 to 1 [place], Unit Group [place] and Medication administration.  Evaluation of Outcomes: Not Met   RN Treatment Plan for Primary Diagnosis: Schizoaffective disorder, bipolar type ([place]) Long Term Goal(s): Knowledge of disease and therapeutic regimen to maintain health will improve  Short Term Goals: Ability to verbalize frustration and anger appropriately will improve, Ability to demonstrate self-control, Ability to participate in decision making will improve, Ability to identify and develop effective coping behaviors will improve and Compliance with prescribed medications will improve  Medication Management: RN will administer medications as ordered by provider, will assess and evaluate patient's response and provide education to patient for prescribed medication. RN will report any adverse  and/or side effects to prescribing provider.  Therapeutic Interventions: 1 on 1 counseling [place], Psychoeducation, Medication administration, Evaluate responses to treatment, Monitor vital signs and CBGs as ordered, Perform/monitor CIWA, COWS, AIMS and Fall Risk screenings as ordered, Perform wound care treatments as ordered.  Evaluation of Outcomes: Not Met   LCSW Treatment Plan for Primary Diagnosis: Schizoaffective disorder, bipolar type ([place]) Long Term Goal(s): Safe transition to appropriate next level of care at discharge, Engage patient in therapeutic group addressing interpersonal concerns.  Short Term Goals: Engage patient in aftercare planning with referrals and resources, Increase emotional regulation, Facilitate acceptance of mental health diagnosis and concerns, Identify triggers associated with mental health/substance abuse issues and Increase skills for wellness and recovery  Therapeutic Interventions: Assess for all discharge needs, 1 to 1 time with Social worker, Explore available resources and support systems, Assess for adequacy in community support network, Educate family and significant other(s) on suicide prevention, Complete Psychosocial Assessment, Interpersonal group therapy.  Evaluation of Outcomes: Not Met   Progress in Treatment: Attending groups: Yes. Participating in groups: Yes. Taking medication as prescribed: No. Toleration medication: Yes. and No. Family/Significant other contact made: Yes, individual(s) contacted:  guardian, mother Ardeen Garland Patient understands diagnosis: Yes. Discussing patient identified problems/goals with staff: Yes. Medical problems stabilized or resolved: No. Denies suicidal/homicidal ideation: Yes. Issues/concerns per patient self-inventory: As evidenced by:  Pt denies need for medication Other:   New problem(s) identified: No, Describe:  None  New Short Term/Long Term Goal(s): Engage patient in aftercare  planning with referrals and resources, Increase emotional regulation, Facilitate acceptance of mental health diagnosis and  concerns, Identify triggers associated with mental health/substance abuse issues and Increase skills for wellness and recovery  Patient Goals:  "To go home, gain independence, get guardianship back." "I'm competent"  Discharge Plan or Barriers: Pt denies need for medication. Pt has his own place, clothes, and receives SSDI and once discharge criteria is met,CSW will assist pt in returning home. CSW will assist with transportation.  Reason for Continuation of Hospitalization: Medication stabilization  Estimated Length of Stay: 1-7 days  Recreational Therapy: Patient Stressors: N/A Patient Goal: Patient will engage in groups without prompting or encouragement from LRT x3 group [place] within 5 recreation therapy group [place].  Attendees: Patient: Jackson [name] 09/19/2020 9:43 AM  Physician: Salley Scarlet, MD 09/19/2020 9:43 AM  Nursing: 09/19/2020 9:43 AM  RN Care Manager: Collier Bullock, RN 09/19/2020 9:43 AM  Social Worker: Assunta Curtis, MSW, LCSW 09/19/2020 9:43 AM  Recreational Therapist: Roanna Epley, Reather Converse, LRT  09/19/2020 9:43 AM  Other: Michell Heinrich, MSW, LCSW, LCAS 09/19/2020 9:43 AM  Other: Myrna Vonseggern [place], MSW, LCSW-A 09/19/2020 9:43 AM  Other: 09/19/2020 9:43 AM    Scribe for Treatment Team: Chace Klippel A [place], [place] 09/19/2020 9:43 AM

## 2020-09-20 MED ORDER — LURASIDONE HCL 40 MG PO TABS
20.0000 mg | ORAL_TABLET | Freq: Every day | ORAL | Status: DC
Start: 2020-09-20 — End: 2020-09-21
  Filled 2020-09-20 (×2): qty 1

## 2020-09-20 MED ORDER — OLANZAPINE 5 MG PO TBDP
25.0000 mg | ORAL_TABLET | Freq: Every day | ORAL | Status: DC
  Administered 2020-09-20: 25 mg via ORAL
  Filled 2020-09-20: qty 5

## 2020-09-20 NOTE — Plan of Care (Signed)
Pt denies depression, anxiety, SI and HI. Pt was educated on care plan and verbalizes understanding. Torrie Mayers RN Problem: Education: Goal: Charity fundraiser Education information/materials will improve Outcome: Progressing   Problem: Health Behavior/Discharge Planning: Goal: Identification of resources available to assist in meeting health care needs will improve Outcome: Progressing Goal: Compliance with treatment plan for underlying cause of condition will improve Outcome: Not Progressing   Problem: Coping: Goal: Ability to identify and develop effective coping behavior will improve Outcome: Progressing Goal: Ability to interact with others will improve Outcome: Progressing Goal: Demonstration of participation in decision-making regarding own care will improve Outcome: Progressing Goal: Ability to use eye contact when communicating with others will improve Outcome: Progressing   Problem: Self-Concept: Goal: Will verbalize positive feelings about self Outcome: Progressing

## 2020-09-20 NOTE — Progress Notes (Signed)
Recreation Therapy Notes  INPATIENT RECREATION THERAPY ASSESSMENT  Patient Details Name: Stan Warriner MRN: [id number] DOB: [birth year] Today's Date: 09/20/2020       Information Obtained From: Patient  Able to Participate in Assessment/Interview: Yes  Patient Presentation: Responsive  Reason for Admission (Per Patient): Active Symptoms  Patient Stressors: Family  Coping Skills:   Read,Prayer,Music,Write  Leisure Interests (2+):  Music - Write music,Music - Listen,Games - Video games,Individual - Reading,Individual - Writing  Frequency of Recreation/Participation: Monthly  Awareness of Community Resources:     [employer]:     Current Use:    If no, Barriers?:    Expressed Interest in [employer] Information:    [place] of Residence:  Film/video editor  Patient Main Form of Transportation: Set designer  Patient Strengths:  Music  Patient Identified Areas of Improvement:  N/A  Patient Goal for Hospitalization:  To go home and get my independence back  Current SI (including self-harm):  No  Current HI:  No  Current AVH: No  Staff Intervention Plan: Group Attendance,Collaborate with Interdisciplinary Treatment Team  Consent to Intern Participation: N/A  Rithik Odea 09/20/2020, 1:12 PM

## 2020-09-20 NOTE — [facility] Group Notes (Signed)
LCSW Group Therapy Note     09/20/2020 11:54 AM     Type of Therapy/Topic:  Group Therapy:  Balance in Life     Participation Level:  Active     Description of Group:    This group will address the concept of balance and how it feels and looks when one is unbalanced. Patients will be encouraged to process areas in their lives that are out of balance and identify reasons for remaining unbalanced. Facilitators will guide patients in utilizing problem-solving interventions to address and correct the stressor making their life unbalanced. Understanding and applying boundaries will be explored and addressed for obtaining and maintaining a balanced life. Patients will be encouraged to explore ways to assertively make their unbalanced needs known to significant others in their lives, using other group members and facilitator for support and feedback.     Therapeutic Goals:  1.      Patient will identify two or more emotions or situations they have that consume much of in their lives.  2.      Patient will identify signs/triggers that life has become out of balance:  3.      Patient will identify two ways to set boundaries in order to achieve balance in their lives:  4.      Patient will demonstrate ability to communicate their needs through discussion and/or role plays     Summary of Patient Progress: Pt discussed that changing perspectives can promote growth internally and also built community as well. He stated that choosing things that can bring harm is a time when taking someone else's opinion is not valid. We discussed that being flexible is way of being that can help provide balance.     Therapeutic Modalities:   Cognitive Behavioral Therapy  Solution-Focused Therapy  Assertiveness Training     Cayson Kalb [place] MSW, LCSW-A  09/20/2020 11:54 AM

## 2020-09-20 NOTE — Progress Notes (Signed)
Pt states that he can not "hear his thoughts" today. He says that he is not himself. He has refused all medications. He has been calm, social but has a blunted affect. H has gone to one groups. He has been reading and writing in his journal.  Torrie Mayers RN

## 2020-09-20 NOTE — Progress Notes (Signed)
[facility] MD Progress Note  09/20/2020 11:40 AM Jackson [name]  MRN:  [id number]   CC: "I don't want any antipsychotics"  Subjective:  35 year old male with schizoaffective disorder, bipolar type who presented to the emergency room on Jan 8th due to mania and increased aggression. No acute events overnight. Continues to be selectively compliant with Zyprexa and no other medications. Attending to ADLs.  Patient seen one-on-one today. He notes that Zyprexa is an antipsychotic, and he does not want any antipsychotics in his body. He notes that it causes him to lose the music and productive thoughts in his head, and feel entirely too drowsy. He does note that the Ativan was helpful for anxiety. He initially states he does not want to take any antipsychotics. Attempts made to explain that he was on Zyprexa for over a month while in the emergency room, and it did help him get better so he could come on the unit. Patient continues to state that this was due to his own will power. He does eventually agree to take a lower dose of Zyprexa closer to bedtime. Also discussed options of carbamazepine, Latuda, and Geodon today. Will try adding Lutada during the day.     Principal Problem: Schizoaffective disorder, bipolar type (HCC) Diagnosis: Principal Problem:   Schizoaffective disorder, bipolar type (HCC) Active Problems:   Noncompliance  Total Time spent with patient: 45 minutes  Past Psychiatric History: See H&P  Past Medical History:  Past Medical History:  Diagnosis Date  . Bipolar 1 disorder (HCC)   . Schizoaffective disorder (HCC)    History reviewed. No pertinent surgical history. Family History: History reviewed. No pertinent family history. Family Psychiatric  History: See H&P Social History:  Social History   Substance and Sexual Activity  Alcohol Use Not Currently     Social History   Substance and Sexual Activity  Drug Use Not Currently    Social History   Socioeconomic History   . Marital status: Single    Spouse name: Not on file  . Number of children: Not on file  . Years of education: Not on file  . Highest education level: Not on file  Occupational History  . Not on file  Tobacco Use  . Smoking status: Light Tobacco Smoker    Packs/day: 0.25    Types: Cigarettes  . Smokeless tobacco: Never Used  Vaping Use  . Vaping Use: Never used  Substance and Sexual Activity  . Alcohol use: Not Currently  . Drug use: Not Currently  . Sexual activity: Not on file  Other Topics Concern  . Not on file  Social History Narrative  . Not on file   Social Determinants of Health   Financial Resource Strain: Not on file  Food Insecurity: Not on file  Transportation Needs: Not on file  Physical Activity: Not on file  Stress: Not on file  Social Connections: Not on file   Additional Social History:                         Sleep: Fair  Appetite:  Fair  Current Medications: Current Facility-Administered Medications  Medication Dose Route Frequency Provider Last Rate Last Admin  . acetaminophen (TYLENOL) tablet 650 mg  650 mg Oral Q6H PRN [name], [name], MD      . alum & mag hydroxide-simeth (MAALOX/MYLANTA) 200-200-20 MG/5ML suspension 30 mL  30 mL Oral Q4H PRN [name], [name], MD      . diphenhydrAMINE (BENADRYL)  injection 50 mg  50 mg Intramuscular Q6H PRN [name], [name], MD      . haloperidol lactate (HALDOL) injection 5 mg  5 mg Intramuscular Q6H PRN [name], [name], MD      . hydrOXYzine (ATARAX/VISTARIL) tablet 50 mg  50 mg Oral TID PRN [name], [name], MD   50 mg at 09/17/20 2118  . LORazepam (ATIVAN) tablet 2 mg  2 mg Oral Q4H PRN [name], [name], MD   2 mg at 09/19/20 2132   Or  . LORazepam (ATIVAN) injection 2 mg  2 mg Intramuscular Q4H PRN [name], [name], MD      . lurasidone (LATUDA) tablet 20 mg  20 mg Oral Q breakfast [name], MD      . magnesium hydroxide (MILK OF MAGNESIA) suspension 30 mL  30 mL Oral Daily PRN [name],  [name], MD      . OLANZapine (ZYPREXA) injection 30 mg  30 mg Intramuscular QHS PRN [name], [name], MD      . OLANZapine zydis (ZYPREXA) disintegrating tablet 25 mg  25 mg Oral QHS [name], MD        Lab Results:  No results found for this or any previous visit (from the past 48 hour(s)).  Blood Alcohol level:  Lab Results  Component Value Date   ETH <10 08/11/2020   ETH <10 04/29/2020    Metabolic Disorder Labs: Lab Results  Component Value Date   HGBA1C 5.2 10/23/2018   MPG 102.54 10/23/2018   No results found for: PROLACTIN Lab Results  Component Value Date   CHOL 129 10/23/2018   TRIG 58 10/23/2018   HDL 50 10/23/2018   CHOLHDL 2.6 10/23/2018   VLDL 12 10/23/2018   LDLCALC 67 10/23/2018    Physical Findings: AIMS:  , ,  ,  ,    CIWA:    COWS:     Musculoskeletal: Strength & Muscle Tone: within normal limits Gait & Station: normal Patient leans: N/A  Psychiatric Specialty Exam: Physical Exam Vitals and nursing note reviewed.  Constitutional:      Appearance: Normal appearance.  HENT:     Head: Normocephalic and atraumatic.     Right Ear: External ear normal.     Left Ear: External ear normal.     Nose: Nose normal.     Mouth/Throat:     Mouth: Mucous membranes are moist.     Pharynx: Oropharynx is clear.  Eyes:     Extraocular Movements: Extraocular movements intact.     Conjunctiva/sclera: Conjunctivae normal.     Pupils: Pupils are equal, round, and reactive to light.  Cardiovascular:     Rate and Rhythm: Normal rate.     Pulses: Normal pulses.  Pulmonary:     Effort: Pulmonary effort is normal.     Breath sounds: Normal breath sounds.  Abdominal:     General: Abdomen is flat.     Palpations: Abdomen is soft.  Musculoskeletal:        General: No swelling. Normal range of motion.     Cervical back: Normal range of motion and neck supple.  Skin:    General: Skin is warm and dry.  Neurological:     General: No focal deficit  present.     Mental Status: He is alert and oriented to person, place, and time.  Psychiatric:        Attention and Perception: He is inattentive. He does not perceive auditory or  visual hallucinations.        Mood and Affect: Affect normal. Mood is depressed.        Speech: Speech is rapid and pressured.        Behavior: Behavior is hyperactive.        Thought Content: Thought content is paranoid. Thought content does not include homicidal or suicidal ideation.        Cognition and Memory: Cognition and memory normal.        Judgment: Judgment is impulsive.     Review of Systems  Constitutional: Positive for appetite change and fatigue.  HENT: Negative for rhinorrhea and sore throat.   Eyes: Negative for photophobia and visual disturbance.  Respiratory: Negative for cough and shortness of breath.   Cardiovascular: Negative for chest pain and palpitations.  Gastrointestinal: Negative for constipation, diarrhea, nausea and vomiting.  Endocrine: Negative for cold intolerance and heat intolerance.  Genitourinary: Negative for difficulty urinating and dysuria.  Musculoskeletal: Negative for arthralgias and myalgias.  Skin: Negative for rash and wound.  Allergic/Immunologic: Negative for environmental allergies and food allergies.  Neurological: Negative for dizziness and headaches.  Hematological: Negative for adenopathy. Does not bruise/bleed easily.  Psychiatric/Behavioral: Positive for decreased concentration, dysphoric mood and sleep disturbance. The patient is hyperactive.     Blood pressure 106/66, pulse 86, temperature 98.6 F (37 C), temperature source Oral, resp. rate 18, height 6' (1.829 m), weight 111.1 kg, SpO2 100 %.Body mass index is 33.23 kg/m.  General Appearance: Fairly Groomed  Eye Contact:  Good  Speech:  Pressured  Volume:  Increased  Mood:  Irritable  Affect:  Congruent  Thought Process:  Disorganized  Orientation:  Full (Time, Place, and Person)  Thought  Content:  Rumination and Tangential  Suicidal Thoughts:  No  Homicidal Thoughts:  No  Memory:  Immediate;   Fair Recent;   Fair Remote;   Fair  Judgement:  Impaired  Insight:  Lacking  Psychomotor Activity:  Normal  Concentration:  Concentration: Poor  Recall:  [employer] of Knowledge:  Fair  Language:  Fair  Akathisia:  Negative  Handed:  Right  AIMS (if indicated):     Assets:  Communication Skills Desire for Improvement Financial Resources/Insurance Housing  ADL's:  Intact  Cognition:  Impaired,  Mild  Sleep:  Number of Hours: 5.25     Treatment Plan Summary: Daily contact with patient to assess and evaluate symptoms and progress in treatment and Medication management  35 yo male with schizoaffective disorder, bipolar type. Currently still exhibiting flight of ideas, lack of insight, and irritability. Patient agreeable to Zyprexa, but not to starting back Abilify. Decrease Zyprexa 25 mg QHS due to excess sedation. Will also offer Latuda during the day to assist with mood stabilization. Will continue to try and foster insight into mental illness. Currently does not meet criteria for non-emergent forced medications.    09/20/20: Psychiatric exam above reviewed and remains accurate. Assessment and plan above reviewed and updated.    [name], MD 09/20/2020, 11:40 AM

## 2020-09-20 NOTE — Plan of Care (Signed)
Patient compliant with procedures on the unit   Problem: Health Behavior/Discharge Planning: Goal: Compliance with treatment plan for underlying cause of condition will improve Outcome: Progressing

## 2020-09-20 NOTE — Progress Notes (Signed)
Patient calm and compliant with procedures on the unit. Patient denies SI/HI/AVH. Patient compliant with medication administration per MD orders. Patient observed interacting appropriately with staff and peers on the unit. Patient given education, support, and encouragement to be active in his treatment plan. Patient being monitored Q 15 minutes for safety per unit protocol. Pt remains safe on the unit. Patient presents with delusions and religiosity thoughts

## 2020-09-20 NOTE — Progress Notes (Signed)
Recreation Therapy Notes  Date: 09/20/2020  Time: 10:00 am   Location: Craft room  Behavioral response: N/A   Intervention Topic: Animal Assisted therapy    Discussion/Intervention: Patient did not attend group.   Clinical Observations/Feedback:  Patient did not attend group.   Loanne Emery LRT/CTRS         Assad Harbeson 09/20/2020 12:38 PM

## 2020-09-21 MED ORDER — LURASIDONE HCL 40 MG PO TABS
20.0000 mg | ORAL_TABLET | Freq: Every day | ORAL | Status: DC
  Administered 2020-09-22 – 2020-09-27 (×6): 20 mg via ORAL
  Filled 2020-09-21 (×5): qty 1

## 2020-09-21 MED ORDER — OLANZAPINE 10 MG PO TBDP
20.0000 mg | ORAL_TABLET | Freq: Every day | ORAL | Status: DC
  Administered 2020-09-21 – 2020-09-27 (×7): 20 mg via ORAL
  Filled 2020-09-21 (×2): qty 4
  Filled 2020-09-21 (×3): qty 2
  Filled 2020-09-21: qty 4
  Filled 2020-09-21 (×3): qty 2
  Filled 2020-09-21 (×2): qty 4
  Filled 2020-09-21 (×2): qty 2
  Filled 2020-09-21: qty 4

## 2020-09-21 NOTE — Progress Notes (Signed)
[facility] MD Progress Note  09/21/2020 1:59 PM Jackson [name]  MRN:  [id number]   CC: "I just want to be heard"  Subjective:  35 year old male with schizoaffective disorder, bipolar type who presented to the emergency room on Jan 8th due to mania and increased aggression. No acute events overnight. Continues to be selectively compliant with Zyprexa and no other medications. Attending to ADLs.  Patient seen one-on-one today. Today patient continues to have flight of ideas and disorganized thought process. His mood is much more labile today as he alternates from hypomanic, to tearful, to irritable during the conversation. He continues to reiterate his past relations with his mother, events leading up to hospital stays, his guardianship, and his current medication regimen. Greater than 50% of the time was spent in counseling and coordination of care. Reviewed patient's diagnosis and treatment plan. Reviewed medication names, doses, indications, and side effect profiles. Patient ultimately agreed to take Latuda and Zyprexa. He denies suicidal ideations, homicidal ideations, visual hallucinations, and auditory hallucinations.    Principal Problem: Schizoaffective disorder, bipolar type (HCC) Diagnosis: Principal Problem:   Schizoaffective disorder, bipolar type (HCC) Active Problems:   Noncompliance  Total Time spent with patient: 45 minutes  Past Psychiatric History: See H&P  Past Medical History:  Past Medical History:  Diagnosis Date  . Bipolar 1 disorder (HCC)   . Schizoaffective disorder (HCC)    History reviewed. No pertinent surgical history. Family History: History reviewed. No pertinent family history. Family Psychiatric  History: See H&P Social History:  Social History   Substance and Sexual Activity  Alcohol Use Not Currently     Social History   Substance and Sexual Activity  Drug Use Not Currently    Social History   Socioeconomic History  . Marital status: Single     Spouse name: Not on file  . Number of children: Not on file  . Years of education: Not on file  . Highest education level: Not on file  Occupational History  . Not on file  Tobacco Use  . Smoking status: Light Tobacco Smoker    Packs/day: 0.25    Types: Cigarettes  . Smokeless tobacco: Never Used  Vaping Use  . Vaping Use: Never used  Substance and Sexual Activity  . Alcohol use: Not Currently  . Drug use: Not Currently  . Sexual activity: Not on file  Other Topics Concern  . Not on file  Social History Narrative  . Not on file   Social Determinants of Health   Financial Resource Strain: Not on file  Food Insecurity: Not on file  Transportation Needs: Not on file  Physical Activity: Not on file  Stress: Not on file  Social Connections: Not on file   Additional Social History:                         Sleep: Fair  Appetite:  Fair  Current Medications: Current Facility-Administered Medications  Medication Dose Route Frequency Provider Last Rate Last Admin  . acetaminophen (TYLENOL) tablet 650 mg  650 mg Oral Q6H PRN [name], [name], MD      . alum & mag hydroxide-simeth (MAALOX/MYLANTA) 200-200-20 MG/5ML suspension 30 mL  30 mL Oral Q4H PRN [name], [name], MD      . diphenhydrAMINE (BENADRYL) injection 50 mg  50 mg Intramuscular Q6H PRN [name], [name], MD      . haloperidol lactate (HALDOL) injection 5 mg  5 mg Intramuscular Q6H PRN [name],  [name], MD      . hydrOXYzine (ATARAX/VISTARIL) tablet 50 mg  50 mg Oral TID PRN [name], [name], MD   50 mg at 09/17/20 2118  . LORazepam (ATIVAN) tablet 2 mg  2 mg Oral Q4H PRN [name], [name], MD   2 mg at 09/19/20 2132   Or  . LORazepam (ATIVAN) injection 2 mg  2 mg Intramuscular Q4H PRN [name], [name], MD      . Melene Muller ON 09/22/2020] lurasidone (LATUDA) tablet 20 mg  20 mg Oral Q supper [name], MD      . magnesium hydroxide (MILK OF MAGNESIA) suspension 30 mL  30 mL Oral Daily PRN [name], [name], MD       . OLANZapine (ZYPREXA) injection 30 mg  30 mg Intramuscular QHS PRN [name], [name], MD      . OLANZapine zydis (ZYPREXA) disintegrating tablet 20 mg  20 mg Oral QHS [name], MD        Lab Results:  No results found for this or any previous visit (from the past 48 hour(s)).  Blood Alcohol level:  Lab Results  Component Value Date   ETH <10 08/11/2020   ETH <10 04/29/2020    Metabolic Disorder Labs: Lab Results  Component Value Date   HGBA1C 5.2 10/23/2018   MPG 102.54 10/23/2018   No results found for: PROLACTIN Lab Results  Component Value Date   CHOL 129 10/23/2018   TRIG 58 10/23/2018   HDL 50 10/23/2018   CHOLHDL 2.6 10/23/2018   VLDL 12 10/23/2018   LDLCALC 67 10/23/2018    Physical Findings: AIMS:  , ,  ,  ,    CIWA:    COWS:     Musculoskeletal: Strength & Muscle Tone: within normal limits Gait & Station: normal Patient leans: N/A  Psychiatric Specialty Exam: Physical Exam Vitals and nursing note reviewed.  Constitutional:      Appearance: Normal appearance.  HENT:     Head: Normocephalic and atraumatic.     Right Ear: External ear normal.     Left Ear: External ear normal.     Nose: Nose normal.     Mouth/Throat:     Mouth: Mucous membranes are moist.     Pharynx: Oropharynx is clear.  Eyes:     Extraocular Movements: Extraocular movements intact.     Conjunctiva/sclera: Conjunctivae normal.     Pupils: Pupils are equal, round, and reactive to light.  Cardiovascular:     Rate and Rhythm: Normal rate.     Pulses: Normal pulses.  Pulmonary:     Effort: Pulmonary effort is normal.     Breath sounds: Normal breath sounds.  Abdominal:     General: Abdomen is flat.     Palpations: Abdomen is soft.  Musculoskeletal:        General: No swelling. Normal range of motion.     Cervical back: Normal range of motion and neck supple.  Skin:    General: Skin is warm and dry.  Neurological:     General: No focal deficit present.     Mental  Status: He is alert and oriented to person, place, and time.  Psychiatric:        Attention and Perception: He is inattentive. He does not perceive auditory or visual hallucinations.        Mood and Affect: Mood is depressed. Affect is labile and tearful.        Speech: Speech  is rapid and pressured.        Behavior: Behavior is hyperactive.        Thought Content: Thought content is paranoid. Thought content does not include homicidal or suicidal ideation.        Cognition and Memory: Cognition and memory normal.        Judgment: Judgment is impulsive.     Review of Systems  Constitutional: Positive for appetite change and fatigue.  HENT: Negative for rhinorrhea and sore throat.   Eyes: Negative for photophobia and visual disturbance.  Respiratory: Negative for cough and shortness of breath.   Cardiovascular: Negative for chest pain and palpitations.  Gastrointestinal: Negative for constipation, diarrhea, nausea and vomiting.  Endocrine: Negative for cold intolerance and heat intolerance.  Genitourinary: Negative for difficulty urinating and dysuria.  Musculoskeletal: Negative for arthralgias and myalgias.  Skin: Negative for rash and wound.  Allergic/Immunologic: Negative for environmental allergies and food allergies.  Neurological: Positive for headaches. Negative for dizziness.  Hematological: Negative for adenopathy. Does not bruise/bleed easily.  Psychiatric/Behavioral: Positive for decreased concentration, dysphoric mood and sleep disturbance. The patient is hyperactive.     Blood pressure 103/73, pulse 69, temperature 98.5 F (36.9 C), temperature source Oral, resp. rate 17, height 6' (1.829 m), weight 111.1 kg, SpO2 100 %.Body mass index is 33.23 kg/m.  General Appearance: Fairly Groomed  Eye Contact:  Good  Speech:  Pressured  Volume:  Increased  Mood:  Depressed  Affect:  Labile and Tearful  Thought Process:  Disorganized  Orientation:  Full (Time, Place, and Person)   Thought Content:  Rumination and Tangential  Suicidal Thoughts:  No  Homicidal Thoughts:  No  Memory:  Immediate;   Fair Recent;   Fair Remote;   Fair  Judgement:  Impaired  Insight:  Lacking  Psychomotor Activity:  Normal  Concentration:  Concentration: Poor  Recall:  [employer] of Knowledge:  Fair  Language:  Fair  Akathisia:  Negative  Handed:  Right  AIMS (if indicated):     Assets:  Communication Skills Desire for Improvement Financial Resources/Insurance Housing  ADL's:  Intact  Cognition:  Impaired,  Mild  Sleep:  Number of Hours: 5.75     Treatment Plan Summary: Daily contact with patient to assess and evaluate symptoms and progress in treatment and Medication management  35 yo male with schizoaffective disorder, bipolar type. Currently still exhibiting flight of ideas, lack of insight, and irritability. Patient agreeable to Zyprexa, but not to starting back Abilify. Decrease Zyprexa 20 mg QHS due to excess sedation. Start Latuda 20 mg with supper Will continue to try and foster insight into mental illness. Currently does not meet criteria for non-emergent forced medications.    Greater than 50% of the time was spent in counseling and coordination of care. Reviewed patient's diagnosis and treatment plan. Reviewed medication names, doses, indications, and side effect profiles.   09/21/20 Psychiatric exam above reviewed and remains accurate. Assessment and plan above reviewed and updated.    [name], MD 09/21/2020, 1:59 PM

## 2020-09-21 NOTE — [facility] Group Notes (Signed)
LCSW Group Therapy Note     09/21/2020 12:54 PM     Type of Therapy and Topic:  Group Therapy:  Feelings around Relapse and Recovery     Participation Level:  Active     Description of Group:    Patients in this group will discuss emotions they experience before and after a relapse. They will process how experiencing these feelings, or avoidance of experiencing them, relates to having a relapse. Facilitator will guide patients to explore emotions they have related to recovery. Patients will be encouraged to process which emotions are more powerful. They will be guided to discuss the emotional reaction significant others in their lives may have to their relapse or recovery. Patients will be assisted in exploring ways to respond to the emotions of others without this contributing to a relapse.     Therapeutic Goals:  1.    Patient will identify two or more emotions that lead to a relapse for them  2.    Patient will identify two emotions that result when they relapse  3.    Patient will identify two emotions related to recovery  4.    Patient will demonstrate ability to communicate their needs through discussion and/or role plays   Summary of Patient Progress:    Therapeutic Modalities:   Cognitive Behavioral Therapy  Solution-Focused Therapy  Assertiveness Training  Relapse Prevention Therapy Pt discussed the complexities of relapse and recovery and stated that they are a "revolving door." Pt had many ideas to discuss and seemed to have difficulty not talking over others at times. We discussed how vital it is to think about both the feelings and ideas related to recovery.        Branden Vine [place], MSW, LCSW-A  09/21/2020 12:54 PM

## 2020-09-21 NOTE — Plan of Care (Signed)
Patient is active in the milieu but remains guarded about taking medications. "I will not take antipsychotics, I am great, my mind is great....". he is otherwise active in groups, eating and sleeping well. No sign of distress.

## 2020-09-21 NOTE — [facility] Suicide Risk Assessment (Addendum)
[facility] INPATIENT:  Family/Significant Other Suicide Prevention Education  Suicide Prevention Education:  Education Completed; mother/guardian, Crystal J Auburn Bilberry  (name of family member/significant other) has been identified by the patient as the family member/significant other with whom the patient will be residing, and identified as the person(s) who will aid the patient in the event of a mental health crisis (suicidal ideations/suicide attempt).  With written consent from the patient, the family member/significant other has been provided the following suicide prevention education, prior to the and/or following the discharge of the patient.  The suicide prevention education provided includes the following:  Suicide risk factors  Suicide prevention and interventions  National Suicide Hotline telephone number  [facility] assessment telephone number  [facility] Emergency Assistance 911  [facility] and/or Residential Mobile Crisis Unit telephone number  Request made of family/significant other to:  Remove weapons (e.g., guns, rifles, knives), all items previously/currently identified as safety concern.    Remove drugs/medications (over-the-counter, prescriptions, illicit drugs), all items previously/currently identified as a safety concern.  The family member/significant other verbalizes understanding of the suicide prevention education information provided.  The family member/significant other agrees to remove the items of safety concern listed above.  Gamaliel Charney A [place] 09/21/2020, 4:30 PM .spe

## 2020-09-21 NOTE — Progress Notes (Signed)
Recreation Therapy Notes   Date: 09/21/2020  Time: 9:30 am   Location: Court yard   Behavioral response: Appropriate  Intervention Topic: Leisure   Discussion/Intervention:  Group content today was focused on leisure. The group defined what leisure is and some positive leisure activities they participate in. Individuals identified the difference between good and bad leisure. Participants expressed how they feel after participating in the leisure of their choice. The group discussed how they go about picking a leisure activity and if others are involved in their leisure activities. The patient stated how many leisure activities they have to choose from and reasons why it is important to have leisure time. Individuals participated in the intervention "Exploration of Leisure" where they had a chance to identify new leisure activities as well as benefits of leisure. Clinical Observations/Feedback: Patient came to group and stated his leisure activities he participates in are programing,writing and spend time in the studio. He explained that leisure is important to help unwind and relax. Individual was social with peers and staff while participating in the intervention. Jearldean Gutt LRT/CTRS        Shanese Riemenschneider 09/21/2020 11:04 AM

## 2020-09-22 NOTE — Progress Notes (Signed)
Patient became more pleasant after talking to therapist/social worker. He has stayed in the milieu and getting along with peers. Received his dinnertime dose of Geodone and did not have any concern about it. Staff continue to provide support and encouragements. Safety precautions maintained.

## 2020-09-22 NOTE — Plan of Care (Signed)
Patient is is more cooperative this morning, calm and pleasant on approach. No sign of distress. Continues to report that he will not put any antipsychotics in his system. He is otherwise getting along with peers, has no aggressive behaviors.  Patient reports that he slept well. Ate breakfast and has no  additional concern. Encouragements and support provided. Safety monitored as expected.

## 2020-09-22 NOTE — Progress Notes (Signed)
[facility] MD Progress Note  09/22/2020 12:37 PM Jackson [name]  MRN:  [id number]   CC: "I just want to be heard"  Subjective:  35 year old male with schizoaffective disorder, bipolar type who presented to the emergency room on Jan 8th due to mania and increased aggression. No acute events overnight. Continues to be selectively compliant with Zyprexa and no other medications. Attending to ADLs.  Patient speaks extensively about his background and what had let him up to his admission. Says that yes he started being a part of mental health system at the age of 88 when he started "fasting in the bathroom" to be away from the world. He then tells me a Bible verse. Says that he gets offended when he's called schizoaffective disorder. Says that he can create sounds in his mind willfully but he is not hearing them otherwise. Grandiosely talks about creating an app called future tech wars II on the [employer] app and how well versed he is in Pharmacologist. 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 Denies paranoia. Denies depression anxiety or irritability.  Principal Problem: Schizoaffective disorder, bipolar type (HCC) Diagnosis: Principal Problem:   Schizoaffective disorder, bipolar type (HCC) Active Problems:   Noncompliance  Total Time spent with patient: 45 minutes  Past Psychiatric History: See H&P  Past Medical History:  Past Medical History:  Diagnosis Date  . Bipolar 1 disorder (HCC)   . Schizoaffective disorder (HCC)    History reviewed. No pertinent surgical history. Family History: History reviewed. No pertinent family history. Family Psychiatric  History: See H&P Social History:  Social History   Substance and Sexual Activity  Alcohol Use Not Currently     Social History   Substance and Sexual Activity  Drug Use Not Currently    Social History   Socioeconomic History  . Marital status: Single    Spouse  name: Not on file  . Number of children: Not on file  . Years of education: Not on file  . Highest education level: Not on file  Occupational History  . Not on file  Tobacco Use  . Smoking status: Light Tobacco Smoker    Packs/day: 0.25    Types: Cigarettes  . Smokeless tobacco: Never Used  Vaping Use  . Vaping Use: Never used  Substance and Sexual Activity  . Alcohol use: Not Currently  . Drug use: Not Currently  . Sexual activity: Not on file  Other Topics Concern  . Not on file  Social History Narrative  . Not on file   Social Determinants of Health   Financial Resource Strain: Not on file  Food Insecurity: Not on file  Transportation Needs: Not on file  Physical Activity: Not on file  Stress: Not on file  Social Connections: Not on file   Additional Social History:                         Sleep: Fair  Appetite:  Fair  Current Medications: Current Facility-Administered Medications  Medication Dose Route Frequency Provider Last Rate Last Admin  . acetaminophen (TYLENOL) tablet 650 mg  650 mg Oral Q6H PRN [name], [name], MD   650 mg at 09/22/20 0331  . alum & mag hydroxide-simeth (MAALOX/MYLANTA) 200-200-20 MG/5ML suspension 30 mL  30 mL Oral Q4H PRN [name], [name], MD      . diphenhydrAMINE (BENADRYL) injection 50 mg  50 mg Intramuscular  Q6H PRN [name], [name], MD      . haloperidol lactate (HALDOL) injection 5 mg  5 mg Intramuscular Q6H PRN [name], [name], MD      . hydrOXYzine (ATARAX/VISTARIL) tablet 50 mg  50 mg Oral TID PRN [name], [name], MD   50 mg at 09/22/20 0331  . LORazepam (ATIVAN) tablet 2 mg  2 mg Oral Q4H PRN [name], [name], MD   2 mg at 09/19/20 2132   Or  . LORazepam (ATIVAN) injection 2 mg  2 mg Intramuscular Q4H PRN [name], [name], MD      . lurasidone (LATUDA) tablet 20 mg  20 mg Oral Q supper [name], MD      . magnesium hydroxide (MILK OF MAGNESIA) suspension 30 mL  30 mL Oral Daily PRN [name], [name], MD      .  OLANZapine (ZYPREXA) injection 30 mg  30 mg Intramuscular QHS PRN [name], [name], MD      . OLANZapine zydis (ZYPREXA) disintegrating tablet 20 mg  20 mg Oral QHS [name], MD   20 mg at 09/21/20 2100    Lab Results:  No results found for this or any previous visit (from the past 48 hour(s)).  Blood Alcohol level:  Lab Results  Component Value Date   ETH <10 08/11/2020   ETH <10 04/29/2020    Metabolic Disorder Labs: Lab Results  Component Value Date   HGBA1C 5.2 10/23/2018   MPG 102.54 10/23/2018   No results found for: PROLACTIN Lab Results  Component Value Date   CHOL 129 10/23/2018   TRIG 58 10/23/2018   HDL 50 10/23/2018   CHOLHDL 2.6 10/23/2018   VLDL 12 10/23/2018   LDLCALC 67 10/23/2018    Physical Findings: AIMS:  , ,  ,  ,    CIWA:    COWS:     Musculoskeletal: Strength & Muscle Tone: within normal limits Gait & Station: normal Patient leans: N/A  Psychiatric Specialty Exam: Physical Exam Vitals and nursing note reviewed.  Constitutional:      Appearance: Normal appearance.  HENT:     Head: Normocephalic and atraumatic.     Right Ear: External ear normal.     Left Ear: External ear normal.     Nose: Nose normal.     Mouth/Throat:     Mouth: Mucous membranes are moist.     Pharynx: Oropharynx is clear.  Eyes:     Extraocular Movements: Extraocular movements intact.     Conjunctiva/sclera: Conjunctivae normal.     Pupils: Pupils are equal, round, and reactive to light.  Cardiovascular:     Rate and Rhythm: Normal rate.     Pulses: Normal pulses.  Pulmonary:     Effort: Pulmonary effort is normal.     Breath sounds: Normal breath sounds.  Abdominal:     General: Abdomen is flat.     Palpations: Abdomen is soft.  Musculoskeletal:        General: No swelling. Normal range of motion.     Cervical back: Normal range of motion and neck supple.  Skin:    General: Skin is warm and dry.  Neurological:     General: No focal deficit  present.     Mental Status: He is alert and oriented to person, place, and time.  Psychiatric:        Attention and Perception: He is inattentive. He does not perceive auditory or visual hallucinations.  Mood and Affect: Mood is depressed. Affect is labile and tearful.        Speech: Speech is rapid and pressured.        Behavior: Behavior is hyperactive.        Thought Content: Thought content is paranoid. Thought content does not include homicidal or suicidal ideation.        Cognition and Memory: Cognition and memory normal.        Judgment: Judgment is impulsive.       Blood pressure 111/79, pulse 68, temperature 98.4 F (36.9 C), temperature source Oral, resp. rate 17, height 6' (1.829 m), weight 111.1 kg, SpO2 100 %.Body mass index is 33.23 kg/m.  General Appearance: Fairly Groomed  Eye Contact:  Good  Speech:  Pressured  Volume:  wnl  Mood:  Fine  Affect:  Mildly elevated  Thought Process:  Disorganized  Orientation:  Full (Time, Place, and Person)  Thought Content:  Rumination and Tangential  Suicidal Thoughts:  No  Homicidal Thoughts:  No  Memory:  Immediate;   Fair Recent;   Fair Remote;   Fair  Judgement:  Impaired  Insight:  Lacking  Psychomotor Activity:  Normal  Concentration:  Concentration: Poor  Recall:  [employer] of Knowledge:  Fair  Language:  Fair  Akathisia:  Negative  Handed:  Right  AIMS (if indicated):     Assets:  Communication Skills Desire for Improvement Financial Resources/Insurance Housing  ADL's:  Intact  Cognition:  Impaired,  Mild  Sleep:  Number of Hours: 3.75     Treatment Plan Summary: Daily contact with patient to assess and evaluate symptoms and progress in treatment and Medication management  35 yo male with schizoaffective disorder, bipolar type. Currently still exhibiting flight of ideas, lack of insight, and irritability. Patient agreeable to Zyprexa, but not to starting back Abilify. Decrease Zyprexa 20 mg QHS due to  excess sedation. Start Latuda 20 mg with supper Will continue to try and foster insight into mental illness. Currently does not meet criteria for non-emergent forced medications.    Greater than 50% of the time was spent in counseling and coordination of care. Reviewed patient's diagnosis and treatment plan. Reviewed medication names, doses, indications, and side effect profiles.   09/21/20 Psychiatric exam above reviewed and remains accurate. Assessment and plan above reviewed and updated.   09/22/20 No changes   Reggie Pile, MD 09/22/2020, 12:37 PM

## 2020-09-22 NOTE — Progress Notes (Signed)
Patient alert and oriented x 4, affect is blunted, thoughts are disorganized, speech is tangential, he is hyper verbal and restless, he was noted in the dayroom yelling ,dancing and  intrusive with other patients. Patient was frequently redirected and he was receptive to staff, he took his scheduled night medication without incident. Patient was offered emotional support and encouragement, 15 minutes safety checks maintained will continue to monitor.

## 2020-09-22 NOTE — [facility] Group Notes (Signed)
  [facility] LCSW Group Therapy Note  Date/Time:  09/22/2020 1:44 PM- 2:57 PM  Type of Therapy and Topic:  Group Therapy:  Feelings About Hospitalization  Participation Level:  Active   Description of Group This process group involved patients discussing their feelings related to being hospitalized, as well as the benefits they see to being in the hospital.  These feelings and benefits were itemized.  The group then brainstormed specific ways in which they could seek those same benefits when they discharge and return home.  Therapeutic Goals 1. Patient will identify and describe positive and negative feelings related to hospitalization 2. Patient will verbalize benefits of hospitalization to themselves personally 3. Patients will brainstorm together ways they can obtain similar benefits in the outpatient setting, identify barriers to wellness and possible solutions  Summary of Patient Progress: Patient spoke about being locked up in the hospital too long and did not understand why patients before him got to leave. Patient also spoke about his legal guardian and how its not fair the things that she can control. Patient spoke with majority of the group about his struggles inside and outside the hospital.   Therapeutic Modalities Cognitive Behavioral Therapy Motivational Interviewing    Susa Simmonds, [place] 09/22/2020  4:15 PM
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
## 2020-09-23 NOTE — Progress Notes (Signed)
Patient is hyperverbal when he approaches this Clinical research associate. He denies SI, HI, and AVH. Patient states he is upset at not being heard and that it reminds him of his mother. He shows this Clinical research associate his journals. Pt begins talking about his mother, causing him to raise his volume. He then begins tearing up during the encounter, but quickly regains composure. A few minutes later, he is observed to be running up and down the hallway. He states that he just wants to exercise and do some cardio. Pt is observed to be interacting with peers in the dayroom.  Pt remains safe on the unit at this time and q15 minute safety checks are maintained.

## 2020-09-23 NOTE — Plan of Care (Signed)
  Problem: Education: Goal: Knowledge of [facility] Education information/materials will improve Outcome: Progressing   Problem: Health Behavior/Discharge Planning: Goal: Compliance with treatment plan for underlying cause of condition will improve Outcome: Progressing   Problem: Coping: Goal: Ability to identify and develop effective coping behavior will improve Outcome: Progressing Goal: Ability to interact with others will improve Outcome: Progressing Goal: Demonstration of participation in decision-making regarding own care will improve Outcome: Progressing Goal: Ability to use eye contact when communicating with others will improve Outcome: Progressing   Problem: Self-Concept: Goal: Will verbalize positive feelings about self Outcome: Progressing

## 2020-09-23 NOTE — Progress Notes (Signed)
Patient presents pleasant and cooperative. Denies any SI, HI, AVH. Medication compliant. States he does not like the meds due to the way it makes his brain feel. Pt is looking forward to getting a discharge date. Reports he has been in the hospitals to long. Pt hyperverbal, reports several different positions his mother(guardian) has. Pt reports he has been deemed incompetent but he reports he is perfectly capable of taking care of himself. Pt blames mom for being here. Pleasant was pleasant during discussion states he would never hurt his mother. Pt understands that his voice is aggressive but states he is from [place] and this is why he talks loud. Pt is pleasant with peers, helpful with peers. No complaints voiced. Medication compliant.  Encouragement and support provided. Safety checks maintained. Medications given as prescribed. Pt receptive and remains safe on unit with q 15 min checks.
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
## 2020-09-23 NOTE — [facility] Group Notes (Signed)
[facility] Group Notes:  (Nursing/MHT/Case Management/Adjunct)  Date:  09/23/2020  Time:  8:59 PM  Type of Therapy:  Group Therapy  Participation Level:  Did Not Attend   [name] 09/23/2020, 8:59 PM

## 2020-09-23 NOTE — Progress Notes (Signed)
[facility] MD Progress Note  09/23/2020 10:25 AM Jackson [name]  MRN:  [id number]   CC: "I'm good."  Subjective:  35 year old male with schizoaffective disorder, bipolar type who presented to the emergency room on Jan 8th due to mania and increased aggression. No acute events overnight.   09/23/20 Patient reports that he is doing well today. He is hyper verbal but slightly less so than yesterday. He spoke for nearly 45 minutes to an hour about a series of topics in the office yesterday. He is much more on topic with subjectively less racing thoughts today. He is somewhat intrusive towards other patients, from what  They have reported. Other patients have reported that he monopolizes therapy/group [place].  He did sleep well last night.. Mentions that higher does of Zyprexa was making him tired all day but now feels more even. He did end up taking Latuda 20mg  yesterday.   Says that he can stay here as long as he needs as long as he can get back to the place that he was staying at.   Denies auditory and visual hallucinations today. Denies paranoia. No suicidal thoughts. Has not been physically aggressive. Taking his medication as prescribed.  09/22/20 Patient speaks extensively about his background and what had let him up to his admission. Says that yes he started being a part of mental health system at the age of 43 when he started "fasting in the bathroom" to be away from the world. He then tells me a Bible verse. Says that he gets offended when he's called schizoaffective disorder. Says that he can create sounds in his mind willfully but he is not hearing them otherwise. Grandiosely talks about creating an app called future [employer] wars [employer] on the [employer] app and how well versed he is in 36. 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 Denies paranoia. Denies depression anxiety or irritability.  Principal Problem:  Schizoaffective disorder, bipolar type (HCC) Diagnosis: Principal Problem:   Schizoaffective disorder, bipolar type (HCC) Active Problems:   Noncompliance  Total Time spent with patient: 45 minutes  Past Psychiatric History: See H&P  Past Medical History:  Past Medical History:  Diagnosis Date  . Bipolar 1 disorder (HCC)   . Schizoaffective disorder (HCC)    History reviewed. No pertinent surgical history. Family History: History reviewed. No pertinent family history. Family Psychiatric  History: See H&P Social History:  Social History   Substance and Sexual Activity  Alcohol Use Not Currently     Social History   Substance and Sexual Activity  Drug Use Not Currently    Social History   Socioeconomic History  . Marital status: Single    Spouse name: Not on file  . Number of children: Not on file  . Years of education: Not on file  . Highest education level: Not on file  Occupational History  . Not on file  Tobacco Use  . Smoking status: Light Tobacco Smoker    Packs/day: 0.25    Types: Cigarettes  . Smokeless tobacco: Never Used  Vaping Use  . Vaping Use: Never used  Substance and Sexual Activity  . Alcohol use: Not Currently  . Drug use: Not Currently  . Sexual activity: Not on file  Other Topics Concern  . Not on file  Social History Narrative  . Not on file   Social Determinants of Health   Financial Resource Strain: Not on file  Food  Insecurity: Not on file  Transportation Needs: Not on file  Physical Activity: Not on file  Stress: Not on file  Social Connections: Not on file   Additional Social History:                         Sleep: Fair  Appetite:  Fair  Current Medications: Current Facility-Administered Medications  Medication Dose Route Frequency Provider Last Rate Last Admin  . acetaminophen (TYLENOL) tablet 650 mg  650 mg Oral Q6H PRN [name], [name], MD   650 mg at 09/22/20 0331  . alum & mag hydroxide-simeth  (MAALOX/MYLANTA) 200-200-20 MG/5ML suspension 30 mL  30 mL Oral Q4H PRN [name], [name], MD      . diphenhydrAMINE (BENADRYL) injection 50 mg  50 mg Intramuscular Q6H PRN [name], [name], MD      . haloperidol lactate (HALDOL) injection 5 mg  5 mg Intramuscular Q6H PRN [name], [name], MD      . hydrOXYzine (ATARAX/VISTARIL) tablet 50 mg  50 mg Oral TID PRN [name], [name], MD   50 mg at 09/22/20 0331  . LORazepam (ATIVAN) tablet 2 mg  2 mg Oral Q4H PRN [name], [name], MD   2 mg at 09/19/20 2132   Or  . LORazepam (ATIVAN) injection 2 mg  2 mg Intramuscular Q4H PRN [name], [name], MD      . lurasidone (LATUDA) tablet 20 mg  20 mg Oral Q supper [name], MD   20 mg at 09/22/20 1703  . magnesium hydroxide (MILK OF MAGNESIA) suspension 30 mL  30 mL Oral Daily PRN [name], [name], MD      . OLANZapine (ZYPREXA) injection 30 mg  30 mg Intramuscular QHS PRN [name], [name], MD      . OLANZapine zydis (ZYPREXA) disintegrating tablet 20 mg  20 mg Oral QHS [name], MD   20 mg at 09/22/20 2035    Lab Results:  No results found for this or any previous visit (from the past 48 hour(s)).  Blood Alcohol level:  Lab Results  Component Value Date   ETH <10 08/11/2020   ETH <10 04/29/2020    Metabolic Disorder Labs: Lab Results  Component Value Date   HGBA1C 5.2 10/23/2018   MPG 102.54 10/23/2018   No results found for: PROLACTIN Lab Results  Component Value Date   CHOL 129 10/23/2018   TRIG 58 10/23/2018   HDL 50 10/23/2018   CHOLHDL 2.6 10/23/2018   VLDL 12 10/23/2018   LDLCALC 67 10/23/2018    Physical Findings: AIMS:  , ,  ,  ,    CIWA:    COWS:     Musculoskeletal: Strength & Muscle Tone: within normal limits Gait & Station: normal Patient leans: N/A  Psychiatric Specialty Exam:   Blood pressure 104/76, pulse 67, temperature 98.7 F (37.1 C), temperature source Oral, resp. rate 18, height 6' (1.829 m), weight 111.1 kg, SpO2 100 %.Body mass index is 33.23  kg/m.  General Appearance: poor grooming this AM.   Eye Contact:  Good  Speech:  Pressured  Volume:  wnl  Mood:  "I mean.Marland KitchenMarland KitchenI'm good."   Affect:  neutral  Thought Process:  Disorganized  Orientation:  Full (Time, Place, and Person)  Thought Content:  tangential  Suicidal Thoughts:  No  Homicidal Thoughts:  No  Memory:  Immediate;   Fair Recent;   Fair Remote;   Fair  Judgement:  Impaired  Insight:  Lacking  Psychomotor Activity:  Normal  Concentration:  Concentration: Poor  Recall:  [employer] of Knowledge:  Fair  Language:  Fair  Akathisia:  Negative  Handed:  Right  AIMS (if indicated):     Assets:  Communication Skills Desire for Improvement Financial Resources/Insurance Housing  ADL's:  Intact  Cognition:  Impaired,  Mild  Sleep:  Number of Hours: 4.15     Treatment Plan Summary: Daily contact with patient to assess and evaluate symptoms and progress in treatment and Medication management  35 yo male with schizoaffective disorder, bipolar type. Currently still exhibiting flight of ideas, lack of insight, and irritability. Patient agreeable to Zyprexa, but not to starting back Abilify. Decrease Zyprexa 20 mg QHS due to excess sedation. Start Latuda 20 mg with supper Will continue to try and foster insight into mental illness. Currently does not meet criteria for non-emergent forced medications.    Greater than 50% of the time was spent in counseling and coordination of care. Reviewed patient's diagnosis and treatment plan. Reviewed medication names, doses, indications, and side effect profiles.   09/21/20 Psychiatric exam above reviewed and remains accurate. Assessment and plan above reviewed and updated.   09/22/20 No changes  09/23/20 No signs of EPS, NMS.   CPT: 98921 Reggie Pile, MD 09/23/2020, 10:25 AM

## 2020-09-23 NOTE — [facility] Group Notes (Addendum)
Patient requested a 1:1 with CSW following group ending. CSW listened to patient talk for approx. 30 minutes about the pain caused by his mother, the issues and pain from loosing his best friends and issues with a staff member here. CSW encouraged patient to walk the halls when needed, drink a glass of water, to journal and read the bible if it brings him comfort. Pt reports feeling so overwhelmed by emotions that there are no words. Pt stated he needs therapy more frequently than once a week. Pt reports being glad to be here now, that he will stay here as long as he needs to be but "truly needing to get out from under his mother" and requested his assigned CSW help him get increased therapy support and a court appointed guardian that is not his mother or he will continue to be hospitalized. Pt tearful throughout. Pt thanked CSW for taking the time to listen.

## 2020-09-24 NOTE — Progress Notes (Signed)
[facility] MD Progress Note  09/24/2020 12:09 PM Jackson [name]  MRN:  [id number]   CC: "Feeling good"  Subjective:  35 year old male with schizoaffective disorder, bipolar type who presented to the emergency room on Jan 8th due to mania and increased aggression. No acute events overnight. Medication compliant. Attending to ADLs.  Patient seen one-on-one today. Patient continues to speak at length, but his thoughts are more linear and his speech is no longer pressured. He discusses his past experiences with the law, and being in jail. He does feel like the Kasandra Knudsen is helping him feel less sad, and he denies side effects to this medication. He does say that he plans to continue these medications at discharge as well. He denies suicidal ideations, homicidal ideations, visual hallucinations, and auditory hallucinations.   Called Crystal Carollee Leitz [phone], no answer, voicemail with callback number provided.    Principal Problem: Schizoaffective disorder, bipolar type (HCC) Diagnosis: Principal Problem:   Schizoaffective disorder, bipolar type (HCC) Active Problems:   Noncompliance  Total Time spent with patient: 45 minutes  Past Psychiatric History: See H&P  Past Medical History:  Past Medical History:  Diagnosis Date  . Bipolar 1 disorder (HCC)   . Schizoaffective disorder (HCC)    History reviewed. No pertinent surgical history. Family History: History reviewed. No pertinent family history. Family Psychiatric  History: See H&P Social History:  Social History   Substance and Sexual Activity  Alcohol Use Not Currently     Social History   Substance and Sexual Activity  Drug Use Not Currently    Social History   Socioeconomic History  . Marital status: Single    Spouse name: Not on file  . Number of children: Not on file  . Years of education: Not on file  . Highest education level: Not on file  Occupational History  . Not on file  Tobacco Use  . Smoking status: Light  Tobacco Smoker    Packs/day: 0.25    Types: Cigarettes  . Smokeless tobacco: Never Used  Vaping Use  . Vaping Use: Never used  Substance and Sexual Activity  . Alcohol use: Not Currently  . Drug use: Not Currently  . Sexual activity: Not on file  Other Topics Concern  . Not on file  Social History Narrative  . Not on file   Social Determinants of Health   Financial Resource Strain: Not on file  Food Insecurity: Not on file  Transportation Needs: Not on file  Physical Activity: Not on file  Stress: Not on file  Social Connections: Not on file   Additional Social History:                         Sleep: Fair  Appetite:  Fair  Current Medications: Current Facility-Administered Medications  Medication Dose Route Frequency Provider Last Rate Last Admin  . acetaminophen (TYLENOL) tablet 650 mg  650 mg Oral Q6H PRN [name], [name], MD   650 mg at 09/22/20 0331  . alum & mag hydroxide-simeth (MAALOX/MYLANTA) 200-200-20 MG/5ML suspension 30 mL  30 mL Oral Q4H PRN [name], [name], MD      . diphenhydrAMINE (BENADRYL) injection 50 mg  50 mg Intramuscular Q6H PRN [name], [name], MD      . haloperidol lactate (HALDOL) injection 5 mg  5 mg Intramuscular Q6H PRN [name], [name], MD      . hydrOXYzine (ATARAX/VISTARIL) tablet 50 mg  50 mg Oral TID PRN [name], [name],  MD   50 mg at 09/22/20 0331  . LORazepam (ATIVAN) tablet 2 mg  2 mg Oral Q4H PRN [name], [name], MD   2 mg at 09/19/20 2132   Or  . LORazepam (ATIVAN) injection 2 mg  2 mg Intramuscular Q4H PRN [name], [name], MD      . lurasidone (LATUDA) tablet 20 mg  20 mg Oral Q supper [name], MD   20 mg at 09/23/20 1650  . magnesium hydroxide (MILK OF MAGNESIA) suspension 30 mL  30 mL Oral Daily PRN [name], [name], MD      . OLANZapine (ZYPREXA) injection 30 mg  30 mg Intramuscular QHS PRN [name], [name], MD      . OLANZapine zydis (ZYPREXA) disintegrating tablet 20 mg  20 mg Oral QHS [name], MD   20  mg at 09/23/20 1952    Lab Results:  No results found for this or any previous visit (from the past 48 hour(s)).  Blood Alcohol level:  Lab Results  Component Value Date   ETH <10 08/11/2020   ETH <10 04/29/2020    Metabolic Disorder Labs: Lab Results  Component Value Date   HGBA1C 5.2 10/23/2018   MPG 102.54 10/23/2018   No results found for: PROLACTIN Lab Results  Component Value Date   CHOL 129 10/23/2018   TRIG 58 10/23/2018   HDL 50 10/23/2018   CHOLHDL 2.6 10/23/2018   VLDL 12 10/23/2018   LDLCALC 67 10/23/2018    Physical Findings: AIMS:  , ,  ,  ,    CIWA:    COWS:     Musculoskeletal: Strength & Muscle Tone: within normal limits Gait & Station: normal Patient leans: N/A  Psychiatric Specialty Exam: Physical Exam Vitals and nursing note reviewed.  Constitutional:      Appearance: Normal appearance.  HENT:     Head: Normocephalic and atraumatic.     Right Ear: External ear normal.     Left Ear: External ear normal.     Nose: Nose normal.     Mouth/Throat:     Mouth: Mucous membranes are moist.     Pharynx: Oropharynx is clear.  Eyes:     Extraocular Movements: Extraocular movements intact.     Conjunctiva/sclera: Conjunctivae normal.     Pupils: Pupils are equal, round, and reactive to light.  Cardiovascular:     Rate and Rhythm: Normal rate.     Pulses: Normal pulses.  Pulmonary:     Effort: Pulmonary effort is normal.     Breath sounds: Normal breath sounds.  Abdominal:     General: Abdomen is flat.     Palpations: Abdomen is soft.  Musculoskeletal:        General: No swelling. Normal range of motion.     Cervical back: Normal range of motion and neck supple.  Skin:    General: Skin is warm and dry.  Neurological:     General: No focal deficit present.     Mental Status: He is alert and oriented to person, place, and time.  Psychiatric:        Attention and Perception: He is attentive. He does not perceive auditory or visual  hallucinations.        Mood and Affect: Mood is depressed. Affect is tearful.        Speech: Speech is tangential.        Behavior: Behavior is cooperative.        Thought Content:  Thought content does not include homicidal or suicidal ideation.        Cognition and Memory: Cognition and memory normal.        Judgment: Judgment is impulsive.     Review of Systems  Constitutional: Positive for appetite change and fatigue.  HENT: Negative for rhinorrhea and sore throat.   Eyes: Negative for photophobia and visual disturbance.  Respiratory: Negative for cough and shortness of breath.   Cardiovascular: Negative for chest pain and palpitations.  Gastrointestinal: Negative for constipation, diarrhea, nausea and vomiting.  Endocrine: Negative for cold intolerance and heat intolerance.  Genitourinary: Negative for difficulty urinating and dysuria.  Musculoskeletal: Negative for arthralgias and myalgias.  Skin: Negative for rash and wound.  Allergic/Immunologic: Negative for environmental allergies and food allergies.  Neurological: Negative for dizziness and headaches.  Hematological: Negative for adenopathy. Does not bruise/bleed easily.  Psychiatric/Behavioral: Positive for decreased concentration, dysphoric mood and sleep disturbance. The patient is hyperactive.     Blood pressure 119/73, pulse 69, temperature 98.4 F (36.9 C), temperature source Oral, resp. rate 18, height 6' (1.829 m), weight 111.1 kg, SpO2 99 %.Body mass index is 33.23 kg/m.  General Appearance: Fairly Groomed  Eye Contact:  Good  Speech:  Normal Rate  Volume:  Normal  Mood:  Depressed  Affect:  Congruent and Tearful  Thought Process:  Less disorganized today  Orientation:  Full (Time, Place, and Person)  Thought Content:  Tangential  Suicidal Thoughts:  No  Homicidal Thoughts:  No  Memory:  Immediate;   Fair Recent;   Fair Remote;   Fair  Judgement:  Intact  Insight:  Shallow  Psychomotor Activity:  Normal   Concentration:  Concentration: Fair  Recall:  [employer] of Knowledge:  Fair  Language:  Fair  Akathisia:  Negative  Handed:  Right  AIMS (if indicated):     Assets:  Communication Skills Desire for Improvement Financial Resources/Insurance Housing  ADL's:  Intact  Cognition:  Impaired,  Mild  Sleep:  Number of Hours: 5     Treatment Plan Summary: Daily contact with patient to assess and evaluate symptoms and progress in treatment and Medication management  35 yo male with schizoaffective disorder, bipolar type. Patient agreeable to Zyprexa 20 mg QHS and Latuda 20 mg with supper. Will continue each at this time. Patient showing some improved insight as he is able to tell these medications are helping him control his sadness and irritability. Calm and cooperative on unit.   09/24/20: Psychiatric exam above reviewed and remains accurate. Assessment and plan above reviewed and updated.     [name], MD 09/24/2020, 12:09 PM

## 2020-09-24 NOTE — Plan of Care (Signed)
Patient observed by this Clinical research associate interacting appropriately with staff and peers on the unit.   Problem: Coping: Goal: Ability to interact with others will improve Outcome: Progressing

## 2020-09-24 NOTE — Progress Notes (Signed)
Patient ID: Jackson [name], male   DOB: 1986-04-09, 35 y.o.   MRN: [id number]  CSW was informed by Dr. Neale Burly that pt is progressing and doing well and that discharge would be a possibility in the next couple of days. CSW briefly talked with pt and he stated that he was feeling good today. CSW contacted with pt's guardian/mother, CJ Auburn Bilberry today regarding pt's improved behavioral health status, return of consent forms, and upcoming discharge housing plan. CSW left two voicemail messages with CSW's contact information to get back in touch with CSW.   Mareta Chesnut [place], MSW, LCSW-A 2/21/20224:00 PM

## 2020-09-24 NOTE — [facility] Group Notes (Signed)
LCSW Group Therapy Note   09/24/2020 1:14 PM  Type of Therapy and Topic:  Group Therapy:  Overcoming Obstacles   Participation Level:  Did Not Attend   Description of Group:    In this group patients will be encouraged to explore what they see as obstacles to their own wellness and recovery. They will be guided to discuss their thoughts, feelings, and behaviors related to these obstacles. The group will process together ways to cope with barriers, with attention given to specific choices patients can make. Each patient will be challenged to identify changes they are motivated to make in order to overcome their obstacles. This group will be process-oriented, with patients participating in exploration of their own experiences as well as giving and receiving support and challenge from other group members.   Therapeutic Goals: 1. Patient will identify personal and current obstacles as they relate to admission. 2. Patient will identify barriers that currently interfere with their wellness or overcoming obstacles.  3. Patient will identify feelings, thought process and behaviors related to these barriers. 4. Patient will identify two changes they are willing to make to overcome these obstacles:      Summary of Patient Progress X     Therapeutic Modalities:   Cognitive Behavioral Therapy Solution Focused Therapy Motivational Interviewing Relapse Prevention Therapy  Simona Huh R. Algis Greenhouse, MSW, LCSW, LCAS 09/24/2020 1:14 PM

## 2020-09-24 NOTE — Plan of Care (Signed)
  Problem: Education: Goal: Knowledge of [facility] Education information/materials will improve Outcome: Progressing   Problem: Health Behavior/Discharge Planning: Goal: Identification of resources available to assist in meeting health care needs will improve Outcome: Progressing Goal: Compliance with treatment plan for underlying cause of condition will improve Outcome: Progressing   Problem: Coping: Goal: Ability to identify and develop effective coping behavior will improve Outcome: Progressing Goal: Ability to interact with others will improve Outcome: Progressing Goal: Demonstration of participation in decision-making regarding own care will improve Outcome: Progressing Goal: Ability to use eye contact when communicating with others will improve Outcome: Progressing   Problem: Self-Concept: Goal: Will verbalize positive feelings about self Outcome: Progressing

## 2020-09-24 NOTE — Progress Notes (Signed)
Patient is animated and is in a pleasant mood when approaching this Clinical research associate. He continues to be hyperverbal. He denies SI, HI, and AVH. He denies pain or any other physical problems.  Patient remains safe on the unit at this time and q15 min safety checks are maintained.

## 2020-09-24 NOTE — Progress Notes (Signed)
Patient has been very pleasant and cooperative. Continues to have some magical thinking, asking this author to hold up my left hand and instructed me that I would feel a needle prick and would hear a sound in my mind. Medication compliant. Denies SI and HI. Denies hearing voices. Affect is bright. Mood is euphoric.

## 2020-09-24 NOTE — Progress Notes (Signed)
Patient calm and compliant with procedures on the unit. Patient denies SI/HI/AVH. Patient compliant with medication administration per MD orders. Patient observed interacting appropriately with staff and peers on the unit. Patient given education, support, and encouragement to be active in his treatment plan. Patient being monitored Q 15 minutes for safety per unit protocol. Pt remains safe on the unit.Patient presents with delusions and religiosity thoughts. Patient also states he wants to talk to the doctor about the mucus he keeps spitting up. Patient given education.

## 2020-09-24 NOTE — Tx Team (Signed)
Interdisciplinary Treatment and Diagnostic Plan Update  09/24/2020 Time of Session: 08:30AM Benford Arora MRN: [id number]  Principal Diagnosis: Schizoaffective disorder, bipolar type [facility])  Secondary Diagnoses: Principal Problem:   Schizoaffective disorder, bipolar type ([place]) Active Problems:   Noncompliance   Current Medications:  Current Facility-Administered Medications  Medication Dose Route Frequency Provider Last Rate Last Admin   acetaminophen (TYLENOL) tablet 650 mg  650 mg Oral Q6H PRN [name], Madie Reno, MD   650 mg at 09/22/20 0331   alum & mag hydroxide-simeth (MAALOX/MYLANTA) 200-200-20 MG/5ML suspension 30 mL  30 mL Oral Q4H PRN [name], Madie Reno, MD       diphenhydrAMINE (BENADRYL) injection 50 mg  50 mg Intramuscular Q6H PRN [name], [name], MD       haloperidol lactate (HALDOL) injection 5 mg  5 mg Intramuscular Q6H PRN [name], Madie Reno, MD       hydrOXYzine (ATARAX/VISTARIL) tablet 50 mg  50 mg Oral TID PRN [name], Madie Reno, MD   50 mg at 09/22/20 0331   LORazepam (ATIVAN) tablet 2 mg  2 mg Oral Q4H PRN [name], Madie Reno, MD   2 mg at 09/19/20 2132   Or   LORazepam (ATIVAN) injection 2 mg  2 mg Intramuscular Q4H PRN [name], Madie Reno, MD       lurasidone (LATUDA) tablet 20 mg  20 mg Oral Q supper Salley Scarlet, MD   20 mg at 09/23/20 1650   magnesium hydroxide (MILK OF MAGNESIA) suspension 30 mL  30 mL Oral Daily PRN [name], Madie Reno, MD       OLANZapine (ZYPREXA) injection 30 mg  30 mg Intramuscular QHS PRN [name], Madie Reno, MD       OLANZapine zydis (ZYPREXA) disintegrating tablet 20 mg  20 mg Oral QHS Salley Scarlet, MD   20 mg at 09/23/20 1952   PTA Medications: Medications Prior to Admission  Medication Sig Dispense Refill Last Dose   ARIPiprazole ER (ABILIFY MAINTENA) 400 MG SRER injection Inject 2 mLs (400 mg total) into the muscle every 28 (twenty-eight) days. 1 each 1    divalproex (DEPAKOTE ER) 500 MG 24 hr tablet Take 1,000 mg by mouth daily.      traZODone  (DESYREL) 100 MG tablet Take 1 tablet (100 mg total) by mouth at bedtime as needed for sleep. 30 tablet 1     Patient Stressors: Financial difficulties Medication change or noncompliance  Patient Strengths: Average or above average intelligence Communication skills Supportive family/friends  Treatment Modalities: Medication Management, Group therapy, Case management,  1 to 1 session with clinician, Psychoeducation, Recreational therapy.   Physician Treatment Plan for Primary Diagnosis: Schizoaffective disorder, bipolar type ([place]) Long Term Goal(s): Improvement in symptoms so as ready for discharge Improvement in symptoms so as ready for discharge   Short Term Goals: Ability to identify changes in lifestyle to reduce recurrence of condition will improve Ability to verbalize feelings will improve Ability to disclose and discuss suicidal ideas Ability to demonstrate self-control will improve Ability to identify and develop effective coping behaviors will improve Ability to maintain clinical measurements within normal limits will improve Compliance with prescribed medications will improve Ability to identify changes in lifestyle to reduce recurrence of condition will improve Ability to verbalize feelings will improve Ability to disclose and discuss suicidal ideas Ability to demonstrate self-control will improve Ability to identify and develop effective coping behaviors will improve Ability to maintain clinical measurements within normal limits will improve Compliance with prescribed medications will improve  Medication Management: Evaluate patient's response, side effects, and tolerance of medication regimen.  Therapeutic Interventions: 1 to 1 [place], Unit Group [place] and Medication administration.  Evaluation of Outcomes: Progressing  Physician Treatment Plan for Secondary Diagnosis: Principal Problem:   Schizoaffective disorder, bipolar type ([place]) Active Problems:    Noncompliance  Long Term Goal(s): Improvement in symptoms so as ready for discharge Improvement in symptoms so as ready for discharge   Short Term Goals: Ability to identify changes in lifestyle to reduce recurrence of condition will improve Ability to verbalize feelings will improve Ability to disclose and discuss suicidal ideas Ability to demonstrate self-control will improve Ability to identify and develop effective coping behaviors will improve Ability to maintain clinical measurements within normal limits will improve Compliance with prescribed medications will improve Ability to identify changes in lifestyle to reduce recurrence of condition will improve Ability to verbalize feelings will improve Ability to disclose and discuss suicidal ideas Ability to demonstrate self-control will improve Ability to identify and develop effective coping behaviors will improve Ability to maintain clinical measurements within normal limits will improve Compliance with prescribed medications will improve     Medication Management: Evaluate patient's response, side effects, and tolerance of medication regimen.  Therapeutic Interventions: 1 to 1 [place], Unit Group [place] and Medication administration.  Evaluation of Outcomes: Progressing   RN Treatment Plan for Primary Diagnosis: Schizoaffective disorder, bipolar type ([place]) Long Term Goal(s): Knowledge of disease and therapeutic regimen to maintain health will improve  Short Term Goals: Ability to verbalize frustration and anger appropriately will improve, Ability to demonstrate self-control, Ability to participate in decision making will improve, Ability to identify and develop effective coping behaviors will improve and Compliance with prescribed medications will improve  Medication Management: RN will administer medications as ordered by provider, will assess and evaluate patient's response and provide education to patient for prescribed  medication. RN will report any adverse and/or side effects to prescribing provider.  Therapeutic Interventions: 1 on 1 counseling [place], Psychoeducation, Medication administration, Evaluate responses to treatment, Monitor vital signs and CBGs as ordered, Perform/monitor CIWA, COWS, AIMS and Fall Risk screenings as ordered, Perform wound care treatments as ordered.  Evaluation of Outcomes: Progressing   LCSW Treatment Plan for Primary Diagnosis: Schizoaffective disorder, bipolar type ([place]) Long Term Goal(s): Safe transition to appropriate next level of care at discharge, Engage patient in therapeutic group addressing interpersonal concerns.  Short Term Goals: Engage patient in aftercare planning with referrals and resources, Increase emotional regulation, Facilitate acceptance of mental health diagnosis and concerns, Identify triggers associated with mental health/substance abuse issues and Increase skills for wellness and recovery  Therapeutic Interventions: Assess for all discharge needs, 1 to 1 time with Social worker, Explore available resources and support systems, Assess for adequacy in community support network, Educate family and significant other(s) on suicide prevention, Complete Psychosocial Assessment, Interpersonal group therapy.  Evaluation of Outcomes: Progressing   Progress in Treatment: Attending groups: Yes. Participating in groups: Yes. Taking medication as prescribed: No. Toleration medication: No. Family/Significant other contact made: Yes, individual(s) contacted:  guardian, mother Ardeen Garland Patient understands diagnosis: Yes. Discussing patient identified problems/goals with staff: Yes. Medical problems stabilized or resolved: No. Denies suicidal/homicidal ideation: Yes. Issues/concerns per patient self-inventory: As evidenced by:  Pt denies need for medication Other:   New problem(s) identified: No, Describe:  None  New Short Term/Long  Term Goal(s): Engage patient in aftercare planning with referrals and resources, Increase emotional regulation, Facilitate acceptance of mental health diagnosis and  concerns, Identify triggers associated with mental health/substance abuse issues and Increase skills for wellness and recovery Update 09/24/20: None Identified  Patient Goals:  "To go home, gain independence, get guardianship back." "I'm competent" Update 09/24/20: None Identified  Discharge Plan or Barriers: Pt denies need for medication. Pt has his own place, clothes, and receives SSDI and once discharge criteria is met,CSW will assist pt in returning home. CSW will assist with transportation. Update 09/24/20: CSW was informed by pt's guardian that other housing options will be considered due to uncertainty of pt's behavior living alone.   Reason for Continuation of Hospitalization: Medication stabilization  Estimated Length of Stay: 1-7 days  Recreational Therapy: Patient Stressors: N/A Patient Goal: Patient will engage in groups without prompting or encouragement from LRT x3 group [place] within 5 recreation therapy group [place].  Attendees: Patient: Coral Holton 09/24/2020 9:06 AM  Physician: Salley Scarlet, MD 09/24/2020 9:06 AM  Nursing: 09/24/2020 9:06 AM  RN Care Manager:  09/24/2020 9:06 AM  Social Worker:  09/24/2020 9:06 AM  Recreational Therapist: 09/24/2020 9:06 AM  Other: Gwynneth Munson" Guerry Bruin, MSW, LCSW, LCAS 09/24/2020 9:06 AM  Other: Demetreus Lothamer [place], MSW, [place] 09/24/2020 9:06 AM  Other: 09/24/2020 9:06 AM    Scribe for Treatment Team: Konrad Hoak A [place], [place] 09/24/2020 9:06 AM

## 2020-09-24 NOTE — Progress Notes (Signed)
Recreation Therapy Notes   Date: 09/24/2020  Time: 9:30 am   Location: Craft room   Behavioral response: Appropriate  Intervention Topic: Time management   Discussion/Intervention:  Group content today was focused on time management. The group defined time management and identified healthy ways to manage time. Individuals expressed how much of the 24 hours they use in a day. Patients expressed how much time they use just for themselves personally. The group expressed how they have managed their time in the past. Individuals participated in the intervention "Managing Life" where they had a chance to see how much of the 24 hours they use and where it goes. Clinical Observations/Feedback: Patient came to group and defined time management as taking the entire day and breaking it up into priorities. He stated that time management help with daily routines and structure. Individual was social with staff while participating in the intervention. Janvi Ammar LRT/CTRS         Tye Juarez 09/24/2020 1:03 PM

## 2020-09-25 NOTE — Plan of Care (Signed)
Patient is out of bed and active in the milieu and pleasant on approach. Alert and oriented x 4. Denies SI/HI. Continues to experience grandiose thinking. His appetite is good. Patient is taking medications as prescribed. Has no sign of distress. Encouragements and support provided. Safety precautions maintained.

## 2020-09-25 NOTE — Progress Notes (Signed)
Patient calm and compliant with procedures on the unit. Patient denies SI/HI/AVH. Patient compliant with medication administration per MD orders. Patient observed interacting appropriately with staff and peers on the unit. Patient given education, support, and encouragement to be active in his treatment plan. Patient being monitored Q 15 minutes for safety per unit protocol. Pt remains safe on the unit.Patient presents with delusions and religiosity thoughts. Patient also states he wants to talk to the doctor about the mucus he keeps spitting up. Patient given education.   

## 2020-09-25 NOTE — Progress Notes (Signed)
[facility] MD Progress Note  09/25/2020 12:55 PM Jackson [name]  MRN:  [id number]   CC: "I'm tired today"  Subjective:  35 year old male with schizoaffective disorder, bipolar type who presented to the emergency room on Jan 8th due to mania and increased aggression. No acute events overnight. Medication compliant. Attending to ADLs.  Patient seen one-on-one today. Patient continues to speak at length, but his thoughts are more linear and his speech is no longer pressured. Today he discusses his apartment, past relationships, and experiences with homelessness.  He denies suicidal ideations, homicidal ideations, visual hallucinations, and auditory hallucinations. He continues to express willingness to continue Latuda and Zyprexa both in the hospital and when he leaves. He appears to be at baselines. Efforts being made by myself and social work staff to reach guardian for discharge planning. Patient also discussed that he used to have tubes for inner ear drainage issues, and feels he would benefit from ENT consult. Have encouraged him to follow-up with PCP on discharge in order to have referral to ENT.   Called Crystal Carollee Leitz again today [phone], no answer, voicemail with callback number provided.    Principal Problem: Schizoaffective disorder, bipolar type ([facility]) Diagnosis: Principal Problem:   Schizoaffective disorder, bipolar type ([facility]) Active Problems:   Noncompliance  Total Time spent with patient: 45 minutes  Past Psychiatric History: See H&P  Past Medical History:  Past Medical History:  Diagnosis Date  . Bipolar 1 disorder ([facility])   . Schizoaffective disorder ([facility])    History reviewed. No pertinent surgical history. Family History: History reviewed. No pertinent family history. Family Psychiatric  History: See H&P Social History:  Social History   Substance and Sexual Activity  Alcohol Use Not Currently     Social History   Substance and Sexual Activity  Drug Use Not  Currently    Social History   Socioeconomic History  . Marital status: Single    Spouse name: Not on file  . Number of children: Not on file  . Years of education: Not on file  . Highest education level: Not on file  Occupational History  . Not on file  Tobacco Use  . Smoking status: Light Tobacco Smoker    Packs/day: 0.25    Types: Cigarettes  . Smokeless tobacco: Never Used  Vaping Use  . Vaping Use: Never used  Substance and Sexual Activity  . Alcohol use: Not Currently  . Drug use: Not Currently  . Sexual activity: Not on file  Other Topics Concern  . Not on file  Social History Narrative  . Not on file   Social Determinants of Health   Financial Resource Strain: Not on file  Food Insecurity: Not on file  Transportation Needs: Not on file  Physical Activity: Not on file  Stress: Not on file  Social Connections: Not on file   Additional Social History:                         Sleep: Fair  Appetite:  Fair  Current Medications: Current Facility-Administered Medications  Medication Dose Route Frequency Provider Last Rate Last Admin  . acetaminophen (TYLENOL) tablet 650 mg  650 mg Oral Q6H PRN [name], [name], MD   650 mg at 09/22/20 0331  . alum & mag hydroxide-simeth (MAALOX/MYLANTA) 200-200-20 MG/5ML suspension 30 mL  30 mL Oral Q4H PRN [name], [name], MD      . diphenhydrAMINE (BENADRYL) injection 50 mg  50 mg Intramuscular Q6H PRN  [name], [name], MD      . haloperidol lactate (HALDOL) injection 5 mg  5 mg Intramuscular Q6H PRN [name], [name], MD      . hydrOXYzine (ATARAX/VISTARIL) tablet 50 mg  50 mg Oral TID PRN [name], [name], MD   50 mg at 09/22/20 0331  . LORazepam (ATIVAN) tablet 2 mg  2 mg Oral Q4H PRN [name], [name], MD   2 mg at 09/19/20 2132   Or  . LORazepam (ATIVAN) injection 2 mg  2 mg Intramuscular Q4H PRN [name], [name], MD      . lurasidone (LATUDA) tablet 20 mg  20 mg Oral Q supper [name], MD   20 mg at 09/24/20  1701  . magnesium hydroxide (MILK OF MAGNESIA) suspension 30 mL  30 mL Oral Daily PRN [name], [name], MD      . OLANZapine (ZYPREXA) injection 30 mg  30 mg Intramuscular QHS PRN [name], [name], MD      . OLANZapine zydis (ZYPREXA) disintegrating tablet 20 mg  20 mg Oral QHS [name], MD   20 mg at 09/24/20 2115    Lab Results:  No results found for this or any previous visit (from the past 48 hour(s)).  Blood Alcohol level:  Lab Results  Component Value Date   ETH <10 08/11/2020   ETH <10 04/29/2020    Metabolic Disorder Labs: Lab Results  Component Value Date   HGBA1C 5.2 10/23/2018   MPG 102.54 10/23/2018   No results found for: PROLACTIN Lab Results  Component Value Date   CHOL 129 10/23/2018   TRIG 58 10/23/2018   HDL 50 10/23/2018   CHOLHDL 2.6 10/23/2018   VLDL 12 10/23/2018   LDLCALC 67 10/23/2018    Physical Findings: AIMS:  , ,  ,  ,    CIWA:    COWS:     Musculoskeletal: Strength & Muscle Tone: within normal limits Gait & Station: normal Patient leans: N/A  Psychiatric Specialty Exam: Physical Exam Vitals and nursing note reviewed.  Constitutional:      Appearance: Normal appearance.  HENT:     Head: Normocephalic and atraumatic.     Right Ear: External ear normal.     Left Ear: External ear normal.     Nose: Nose normal.     Mouth/Throat:     Mouth: Mucous membranes are moist.     Pharynx: Oropharynx is clear.  Eyes:     Extraocular Movements: Extraocular movements intact.     Conjunctiva/sclera: Conjunctivae normal.     Pupils: Pupils are equal, round, and reactive to light.  Cardiovascular:     Rate and Rhythm: Normal rate.     Pulses: Normal pulses.  Pulmonary:     Effort: Pulmonary effort is normal.     Breath sounds: Normal breath sounds.  Abdominal:     General: Abdomen is flat.     Palpations: Abdomen is soft.  Musculoskeletal:        General: No swelling. Normal range of motion.     Cervical back: Normal range of  motion and neck supple.  Skin:    General: Skin is warm and dry.  Neurological:     General: No focal deficit present.     Mental Status: He is alert and oriented to person, place, and time.  Psychiatric:        Attention and Perception: He is attentive. He does not perceive auditory or visual hallucinations.  Mood and Affect: Mood and affect normal. Mood is not depressed. Affect is not tearful.        Speech: Speech is tangential.        Behavior: Behavior is cooperative.        Thought Content: Thought content does not include homicidal or suicidal ideation.        Cognition and Memory: Cognition and memory normal.        Judgment: Judgment is impulsive.     Review of Systems  Constitutional: Positive for fatigue. Negative for appetite change.  HENT: Negative for rhinorrhea and sore throat.   Eyes: Negative for photophobia and visual disturbance.  Respiratory: Negative for cough and shortness of breath.   Cardiovascular: Negative for chest pain and palpitations.  Gastrointestinal: Negative for constipation, diarrhea, nausea and vomiting.  Endocrine: Negative for cold intolerance and heat intolerance.  Genitourinary: Negative for difficulty urinating and dysuria.  Musculoskeletal: Negative for arthralgias and myalgias.  Skin: Negative for rash and wound.  Allergic/Immunologic: Negative for environmental allergies and food allergies.  Neurological: Negative for dizziness and headaches.  Hematological: Negative for adenopathy. Does not bruise/bleed easily.  Psychiatric/Behavioral: Negative for behavioral problems, decreased concentration, dysphoric mood and sleep disturbance. The patient is hyperactive.     Blood pressure 117/73, pulse 85, temperature 98.1 F (36.7 C), temperature source Oral, resp. rate 18, height 6' (1.829 m), weight 111.1 kg, SpO2 100 %.Body mass index is 33.23 kg/m.  General Appearance: Fairly Groomed  Eye Contact:  Good  Speech:  Normal Rate  Volume:   Normal  Mood:  Depressed  Affect:  Congruent  Thought Process:  Less disorganized today  Orientation:  Full (Time, Place, and Person)  Thought Content:  Tangential  Suicidal Thoughts:  No  Homicidal Thoughts:  No  Memory:  Immediate;   Fair Recent;   Fair Remote;   Fair  Judgement:  Intact  Insight:  Shallow  Psychomotor Activity:  Normal  Concentration:  Concentration: Fair  Recall:  [employer] of Knowledge:  Fair  Language:  Fair  Akathisia:  Negative  Handed:  Right  AIMS (if indicated):     Assets:  Communication Skills Desire for Improvement Financial Resources/Insurance Housing  ADL's:  Intact  Cognition:  Impaired,  Mild  Sleep:  Number of Hours: 8     Treatment Plan Summary: Daily contact with patient to assess and evaluate symptoms and progress in treatment and Medication management  35 yo male with schizoaffective disorder, bipolar type. Patient agreeable to Zyprexa 20 mg QHS and Latuda 20 mg with supper. Will continue each at this time. Patient showing some improved insight as he is able to tell these medications are helping him control his sadness and irritability. Calm and cooperative on unit.   09/25/20: Psychiatric exam above reviewed and remains accurate. Assessment and plan above reviewed and updated.     [name], MD 09/25/2020, 12:55 PM

## 2020-09-25 NOTE — Progress Notes (Signed)
Recreation Therapy Notes  Date: 09/25/2020  Time: 9:30 am   Location: Craft room   Behavioral response: Appropriate  Intervention Topic: Problem Solving    Discussion/Intervention:  Group content today was focused on time management. The group defined time management and identified healthy ways to manage time. Individuals expressed how much of the 24 hours they use in a day. Patients expressed how much time they use just for themselves personally. The group expressed how they have managed their time in the past. Individuals participated in the intervention "Managing Life" where they had a chance to see how much of the 24 hours they use and where it goes. Clinical Observations/Feedback: Patient came to group late and left early stating he needed to get his vitals taken.  Ninette Cotta LRT/CTRS          Wyona Neils 09/25/2020 12:34 PM

## 2020-09-25 NOTE — [facility] Counselor (Signed)
CSW attempted to contact the patient's mother/step-father and left HIPAA compliant voicemail.  Penni Homans, MSW, LCSW 09/25/2020 4:05 PM

## 2020-09-25 NOTE — [facility] Group Notes (Signed)
LCSW Group Therapy Note  09/25/2020 2:30 PM  Type of Therapy/Topic:  Group Therapy:  Feelings about Diagnosis  Participation Level:  Active   Description of Group:   This group will allow patients to explore their thoughts and feelings about diagnoses they have received. Patients will be guided to explore their level of understanding and acceptance of these diagnoses. Facilitator will encourage patients to process their thoughts and feelings about the reactions of others to their diagnosis and will guide patients in identifying ways to discuss their diagnosis with significant others in their lives. This group will be process-oriented, with patients participating in exploration of their own experiences, giving and receiving support, and processing challenge from other group members.   Therapeutic Goals: 1. Patient will demonstrate understanding of diagnosis as evidenced by identifying two or more symptoms of the disorder 2. Patient will be able to express two feelings regarding the diagnosis 3. Patient will demonstrate their ability to communicate their needs through discussion and/or role play  Summary of Patient Progress: Patient was present in group. Patient was an active participant, though sat with his eyes closed throughout group.  Patient reported the negative aspects his mental health diagnosis has caused between he and his mother.  He reports that he does not like the stigma attached with mental health.   Therapeutic Modalities:   Cognitive Behavioral Therapy Brief Therapy Feelings Identification   Penni Homans, MSW, LCSW 09/25/2020 2:30 PM

## 2020-09-25 NOTE — Plan of Care (Signed)
Patient observed interacting appropriately with staff and peers on the unit   Problem: Coping: Goal: Ability to interact with others will improve Outcome: Progressing

## 2020-09-26 NOTE — Plan of Care (Signed)
Pt denies depression, anxiety, SI, HI and AVH. Pt was educated on care plan and verbalizes understanding. Zoe Nordin RN °Problem: Education: °Goal: Knowledge of [facility] Education information/materials will improve °Outcome: Progressing °  °Problem: Health Behavior/Discharge Planning: °Goal: Identification of resources available to assist in meeting health care needs will improve °Outcome: Progressing °Goal: Compliance with treatment plan for underlying cause of condition will improve °Outcome: Progressing °  °Problem: Coping: °Goal: Ability to identify and develop effective coping behavior will improve °Outcome: Progressing °Goal: Ability to interact with others will improve °Outcome: Progressing °Goal: Demonstration of participation in decision-making regarding own care will improve °Outcome: Progressing °Goal: Ability to use eye contact when communicating with others will improve °Outcome: Progressing °  °Problem: Self-Concept: °Goal: Will verbalize positive feelings about self °Outcome: Progressing °  °

## 2020-09-26 NOTE — Progress Notes (Signed)
Spoke with patient's mother and legal guardian [name] [phone]. Updated her on patient's progress and current medication regimen. They are agreeable to Friday discharge on or after 2PM as they will be able to arrange for him to get back into his apartment. She also agrees to follow-up with [facility].

## 2020-09-26 NOTE — Progress Notes (Signed)
Patient calm and compliant with procedures on the unit. Patient denies SI/HI/AVH.Patient compliant with medication administration per MD orders. Patient observed interacting appropriately with staff and peers on the unit. Patient given education, support, and encouragement to be active in his treatment plan. Patient being monitored Q 15 minutes for safety per unit protocol. Pt remains safe on the unit.

## 2020-09-26 NOTE — Plan of Care (Signed)
Patient observed interacting appropriately with staff and peers   Problem: Coping: Goal: Ability to interact with others will improve Outcome: Progressing

## 2020-09-26 NOTE — Progress Notes (Signed)
Recreation Therapy Notes  Date: 09/26/2020  Time: 9:30 am   Location: Craft room   Behavioral response: Appropriate, Lethargic  Intervention Topic: Stress Management    Discussion/Intervention:  Group content on today was focused on stress. The group defined stress and way to cope with stress. Participants expressed how they know when they are stresses out. Individuals described the different ways they have to cope with stress. The group stated reasons why it is important to cope with stress. Patient explained what good stress is and some examples. The group participated in the intervention "Stress Management". Individuals were separated into two group and answered questions related to stress.  Clinical Observations/Feedback: Patient came to group late due to unknown reasons. He was sleep on and off during group.  Individual was social with staff while participating in the intervention. Jackson [name] LRT/CTRS           [name] 09/26/2020 12:02 PM

## 2020-09-26 NOTE — Progress Notes (Signed)
[facility] MD Progress Note  09/26/2020 1:15 PM Jackson [name]  MRN:  [id number]   CC: "I'm okay"  Subjective:  35 year old male with schizoaffective disorder, bipolar type who presented to the emergency room on Jan 8th due to mania and increased aggression. No acute events overnight. Medication compliant. Attending to ADLs.  Patient seen one-on-one today. He notes that he is feeling okay today, and is hopeful to discharge soon. His speech is normal rate and normal volume today. He denies suicidal ideations, homicidal ideations, visual hallucinations, and auditory hallucinations. He continues to express willingness to continue Latuda and Zyprexa both in the hospital and when he leaves. He appears to be at baseline.   Able to contact Jackson [name], patient's stepfather, at [phone] today. He was given update on Jackson [name]'s progress, current medication names and doses. He confirms that Jackson [name] still has his apartment, and requests that I call his mother around 5:15PM tonight, and also contact his Jackson [name].  Licensed conveyancer at [phone]- They confirmed that Jackson [name] is still an active client, and they will see him three straight days upon discharge. They request we call back when we know the exact discharge date so they can arrange his follow-up care.   Principal Problem: Schizoaffective disorder, bipolar type (HCC) Diagnosis: Principal Problem:   Schizoaffective disorder, bipolar type (HCC) Active Problems:   Noncompliance  Total Time spent with patient: 30 minutes  Past Psychiatric History: See H&P  Past Medical History:  Past Medical History:  Diagnosis Date  . Bipolar 1 disorder (HCC)   . Schizoaffective disorder (HCC)    History reviewed. No pertinent surgical history. Family History: History reviewed. No pertinent family history. Family Psychiatric  History: See H&P Social History:  Social History   Substance and Sexual Activity  Alcohol Use Not Currently     Social  History   Substance and Sexual Activity  Drug Use Not Currently    Social History   Socioeconomic History  . Marital status: Single    Spouse name: Not on file  . Number of children: Not on file  . Years of education: Not on file  . Highest education level: Not on file  Occupational History  . Not on file  Tobacco Use  . Smoking status: Light Tobacco Smoker    Packs/day: 0.25    Types: Cigarettes  . Smokeless tobacco: Never Used  Vaping Use  . Vaping Use: Never used  Substance and Sexual Activity  . Alcohol use: Not Currently  . Drug use: Not Currently  . Sexual activity: Not on file  Other Topics Concern  . Not on file  Social History Narrative  . Not on file   Social Determinants of Health   Financial Resource Strain: Not on file  Food Insecurity: Not on file  Transportation Needs: Not on file  Physical Activity: Not on file  Stress: Not on file  Social Connections: Not on file   Additional Social History:                         Sleep: Fair  Appetite:  Fair  Current Medications: Current Facility-Administered Medications  Medication Dose Route Frequency Provider Last Rate Last Admin  . acetaminophen (TYLENOL) tablet 650 mg  650 mg Oral Q6H PRN [name], [name], MD   650 mg at 09/22/20 0331  . alum & mag hydroxide-simeth (MAALOX/MYLANTA) 200-200-20 MG/5ML suspension 30 mL  30 mL Oral Q4H PRN [name], [name], MD      .  diphenhydrAMINE (BENADRYL) injection 50 mg  50 mg Intramuscular Q6H PRN [name], [name], MD      . haloperidol lactate (HALDOL) injection 5 mg  5 mg Intramuscular Q6H PRN [name], [name], MD      . hydrOXYzine (ATARAX/VISTARIL) tablet 50 mg  50 mg Oral TID PRN [name], [name], MD   50 mg at 09/22/20 0331  . LORazepam (ATIVAN) tablet 2 mg  2 mg Oral Q4H PRN [name], [name], MD   2 mg at 09/19/20 2132   Or  . LORazepam (ATIVAN) injection 2 mg  2 mg Intramuscular Q4H PRN [name], [name], MD      . lurasidone (LATUDA) tablet 20 mg  20 mg  Oral Q supper Jackson Sans, MD   20 mg at 09/25/20 1649  . magnesium hydroxide (MILK OF MAGNESIA) suspension 30 mL  30 mL Oral Daily PRN [name], [name], MD      . OLANZapine (ZYPREXA) injection 30 mg  30 mg Intramuscular QHS PRN [name], [name], MD      . OLANZapine zydis (ZYPREXA) disintegrating tablet 20 mg  20 mg Oral QHS Jackson Sans, MD   20 mg at 09/25/20 2125    Lab Results:  No results found for this or any previous visit (from the past 48 hour(s)).  Blood Alcohol level:  Lab Results  Component Value Date   ETH <10 08/11/2020   ETH <10 04/29/2020    Metabolic Disorder Labs: Lab Results  Component Value Date   HGBA1C 5.2 10/23/2018   MPG 102.54 10/23/2018   No results found for: PROLACTIN Lab Results  Component Value Date   CHOL 129 10/23/2018   TRIG 58 10/23/2018   HDL 50 10/23/2018   CHOLHDL 2.6 10/23/2018   VLDL 12 10/23/2018   LDLCALC 67 10/23/2018    Physical Findings: AIMS:  , ,  ,  ,    CIWA:    COWS:     Musculoskeletal: Strength & Muscle Tone: within normal limits Gait & Station: normal Patient leans: N/A  Psychiatric Specialty Exam: Physical Exam Vitals and nursing note reviewed.  Constitutional:      Appearance: Normal appearance.  HENT:     Head: Normocephalic and atraumatic.     Right Ear: External ear normal.     Left Ear: External ear normal.     Nose: Nose normal.     Mouth/Throat:     Mouth: Mucous membranes are moist.     Pharynx: Oropharynx is clear.  Eyes:     Extraocular Movements: Extraocular movements intact.     Conjunctiva/sclera: Conjunctivae normal.     Pupils: Pupils are equal, round, and reactive to light.  Cardiovascular:     Rate and Rhythm: Normal rate.     Pulses: Normal pulses.  Pulmonary:     Effort: Pulmonary effort is normal.     Breath sounds: Normal breath sounds.  Abdominal:     General: Abdomen is flat.     Palpations: Abdomen is soft.  Musculoskeletal:        General: No swelling. Normal  range of motion.     Cervical back: Normal range of motion and neck supple.  Skin:    General: Skin is warm and dry.  Neurological:     General: No focal deficit present.     Mental Status: He is alert and oriented to person, place, and time.  Psychiatric:        Attention and Perception: He  is attentive. He does not perceive auditory or visual hallucinations.        Mood and Affect: Mood and affect normal. Mood is not depressed. Affect is not tearful.        Speech: Speech normal. Speech is not tangential.        Behavior: Behavior is cooperative.        Thought Content: Thought content does not include homicidal or suicidal ideation.        Cognition and Memory: Cognition and memory normal.        Judgment: Judgment is impulsive.     Review of Systems  Constitutional: Positive for fatigue. Negative for appetite change.  HENT: Negative for rhinorrhea and sore throat.   Eyes: Negative for photophobia and visual disturbance.  Respiratory: Negative for cough and shortness of breath.   Cardiovascular: Negative for chest pain and palpitations.  Gastrointestinal: Negative for constipation, diarrhea, nausea and vomiting.  Endocrine: Negative for cold intolerance and heat intolerance.  Genitourinary: Negative for difficulty urinating and dysuria.  Musculoskeletal: Negative for arthralgias and myalgias.  Skin: Negative for rash and wound.  Allergic/Immunologic: Negative for environmental allergies and food allergies.  Neurological: Negative for dizziness and headaches.  Hematological: Negative for adenopathy. Does not bruise/bleed easily.  Psychiatric/Behavioral: Negative for behavioral problems, decreased concentration, dysphoric mood and sleep disturbance. The patient is not hyperactive.     Blood pressure 119/87, pulse 77, temperature 98.2 F (36.8 C), temperature source Oral, resp. rate 18, height 6' (1.829 m), weight 111.1 kg, SpO2 100 %.Body mass index is 33.23 kg/m.  General  Appearance: Fairly Groomed  Eye Contact:  Good  Speech:  Normal Rate  Volume:  Normal  Mood:  Depressed  Affect:  Congruent  Thought Process:  Goal-oriented today  Orientation:  Full (Time, Place, and Person)  Thought Content:  Logical  Suicidal Thoughts:  No  Homicidal Thoughts:  No  Memory:  Immediate;   Fair Recent;   Fair Remote;   Fair  Judgement:  Intact  Insight:  Shallow  Psychomotor Activity:  Normal  Concentration:  Concentration: Fair  Recall:  [employer] of Knowledge:  Fair  Language:  Fair  Akathisia:  Negative  Handed:  Right  AIMS (if indicated):     Assets:  Communication Skills Desire for Improvement Financial Resources/Insurance Housing  ADL's:  Intact  Cognition:  Impaired,  Mild  Sleep:  Number of Hours: 6.25     Treatment Plan Summary: Daily contact with patient to assess and evaluate symptoms and progress in treatment and Medication management  35 yo male with schizoaffective disorder, bipolar type. Patient agreeable to Zyprexa 20 mg QHS and Latuda 20 mg with supper. Will continue each at this time. Patient showing some improved insight as he is able to tell these medications are helping him control his sadness and irritability. Calm and cooperative on unit.   09/26/20: Psychiatric exam above reviewed and remains accurate. Assessment and plan above reviewed and updated.    Jackson Sans, MD 09/26/2020, 1:15 PM

## 2020-09-26 NOTE — Plan of Care (Signed)
°  Problem: Group Participation °Goal: STG - Patient will engage in groups without prompting or encouragement from LRT x3 group Regan within 5 recreation therapy group [place] °Description: STG - Patient will engage in groups without prompting or encouragement from LRT x3 group [place] within 5 recreation therapy group [place] °Outcome: Progressing °  °

## 2020-09-26 NOTE — Plan of Care (Signed)
  Problem: Education: Goal: Knowledge of [facility] Education information/materials will improve 09/26/2020 1638 by Chalmers Cater, RN Outcome: Progressing 09/26/2020 1631 by Chalmers Cater, RN Outcome: Progressing   Problem: Health Behavior/Discharge Planning: Goal: Identification of resources available to assist in meeting health care needs will improve 09/26/2020 1638 by Chalmers Cater, RN Outcome: Progressing 09/26/2020 1631 by Chalmers Cater, RN Outcome: Progressing Goal: Compliance with treatment plan for underlying cause of condition will improve 09/26/2020 1638 by Chalmers Cater, RN Outcome: Progressing 09/26/2020 1631 by Chalmers Cater, RN Outcome: Progressing   Problem: Coping: Goal: Ability to identify and develop effective coping behavior will improve 09/26/2020 1638 by Chalmers Cater, RN Outcome: Progressing 09/26/2020 1631 by Chalmers Cater, RN Outcome: Progressing Goal: Ability to interact with others will improve 09/26/2020 1638 by Chalmers Cater, RN Outcome: Progressing 09/26/2020 1631 by Chalmers Cater, RN Outcome: Progressing Goal: Demonstration of participation in decision-making regarding own care will improve 09/26/2020 1638 by Chalmers Cater, RN Outcome: Progressing 09/26/2020 1631 by Chalmers Cater, RN Outcome: Progressing Goal: Ability to use eye contact when communicating with others will improve 09/26/2020 1638 by Chalmers Cater, RN Outcome: Progressing 09/26/2020 1631 by Chalmers Cater, RN Outcome: Progressing   Problem: Self-Concept: Goal: Will verbalize positive feelings about self 09/26/2020 1638 by Chalmers Cater, RN Outcome: Progressing 09/26/2020 1631 by Chalmers Cater, RN Outcome: Progressing   Problem: Coping: Goal: Ability to identify and develop effective coping behavior will improve 09/26/2020 1638 by Chalmers Cater, RN Outcome: Progressing 09/26/2020 1631 by Chalmers Cater, RN Outcome: Progressing Goal: Ability to  interact with others will improve 09/26/2020 1638 by Chalmers Cater, RN Outcome: Progressing 09/26/2020 1631 by Chalmers Cater, RN Outcome: Progressing Goal: Demonstration of participation in decision-making regarding own care will improve 09/26/2020 1638 by Chalmers Cater, RN Outcome: Progressing 09/26/2020 1631 by Chalmers Cater, RN Outcome: Progressing Goal: Ability to use eye contact when communicating with others will improve 09/26/2020 1638 by Chalmers Cater, RN Outcome: Progressing 09/26/2020 1631 by Chalmers Cater, RN Outcome: Progressing   Problem: Self-Concept: Goal: Will verbalize positive feelings about self 09/26/2020 1638 by Chalmers Cater, RN Outcome: Progressing 09/26/2020 1631 by Chalmers Cater, RN Outcome: Progressing

## 2020-09-27 NOTE — [facility] Group Notes (Signed)
[facility] Group Notes:  (Nursing/MHT/Case Management/Adjunct)  Date:  09/27/2020  Time:  9:07 AM  Type of Therapy:  Community Meeting  Participation Level:  Did Not Attend   Lynelle Smoke [facility] 09/27/2020, 9:07 AM

## 2020-09-27 NOTE — Plan of Care (Signed)
Pt denies depression, anxiety, SI, HI and AVH. Pt was educated on care plan and verbalizes understanding. Torrie Mayers RN Problem: Education: Goal: [facility] Education information/materials will improve Outcome: Progressing   Problem: Health Behavior/Discharge Planning: Goal: Identification of resources available to assist in meeting health care needs will improve Outcome: Progressing Goal: Compliance with treatment plan for underlying cause of condition will improve Outcome: Progressing   Problem: Coping: Goal: Ability to identify and develop effective coping behavior will improve Outcome: Progressing Goal: Ability to interact with others will improve Outcome: Progressing Goal: Demonstration of participation in decision-making regarding own care will improve Outcome: Progressing Goal: Ability to use eye contact when communicating with others will improve Outcome: Progressing   Problem: Self-Concept: Goal: Will verbalize positive feelings about self Outcome: Progressing

## 2020-09-27 NOTE — [facility] Counselor (Incomplete)
LCSW Group Therapy Note     09/27/2020 12:58 PM     Type of Therapy/Topic:  Group Therapy:  Balance in Life     Participation Level:  {[facility] PARTICIPATION [id number]}     Description of Group:    This group will address the concept of balance and how it feels and looks when one is unbalanced. Patients will be encouraged to process areas in their lives that are out of balance and identify reasons for remaining unbalanced. Facilitators will guide patients in utilizing problem-solving interventions to address and correct the stressor making their life unbalanced. Understanding and applying boundaries will be explored and addressed for obtaining and maintaining a balanced life. Patients will be encouraged to explore ways to assertively make their unbalanced needs known to significant others in their lives, using other group members and facilitator for support and feedback.     Therapeutic Goals:  1.      Patient will identify two or more emotions or situations they have that consume much of in their lives.  2.      Patient will identify signs/triggers that life has become out of balance:  3.      Patient will identify two ways to set boundaries in order to achieve balance in their lives:  4.      Patient will demonstrate ability to communicate their needs through discussion and/or role plays     Summary of Patient Progress:       Therapeutic Modalities:   Cognitive Behavioral Therapy  Solution-Focused Therapy  Assertiveness Training     Kiva [place] MSW, LCSW-A  09/27/2020 12:58 PM

## 2020-09-27 NOTE — Progress Notes (Signed)
[facility] MD Progress Note  09/27/2020 12:04 PM Jackson [name]  MRN:  [id number]   CC: "I'm okay"  Subjective:  35 year old male with schizoaffective disorder, bipolar type who presented to the emergency room on Jan 8th due to mania and increased aggression. No acute events overnight. Medication compliant. Attending to ADLs.  Patient seen one-on-one today. He states he is somewhat tired this morning, but otherwise feeling okay. He is excited about discharging back home tomorrow. He does express some concerns about leaving the hospital as he has become used to all the staff. Spent time processing his mixed emotions about returning home, and tried to brainstorm some activities to do once he gets home to occupy his time. He does mention his love of music, video games, and writing. He shows me a sci-fi book he wrote in the past. We dicussed community parks, open mic nights, or perhaps an adult sports league to help him engage with other individuals.  He denies suicidal ideations, homicidal ideations, visual hallucinations, and auditory hallucinations. He continues to express willingness to continue Latuda and Zyprexa both in the hospital and when he leaves.  Contacted [employer] at [phone]: He will be seen Saturday, and Sunday, and 7 days thereafter.  Principal Problem: Schizoaffective disorder, bipolar type (HCC) Diagnosis: Principal Problem:   Schizoaffective disorder, bipolar type (HCC) Active Problems:   Noncompliance  Total Time spent with patient: 30 minutes  Past Psychiatric History: See H&P  Past Medical History:  Past Medical History:  Diagnosis Date  . Bipolar 1 disorder (HCC)   . Schizoaffective disorder (HCC)    History reviewed. No pertinent surgical history. Family History: History reviewed. No pertinent family history. Family Psychiatric  History: See H&P Social History:  Social History   Substance and Sexual Activity  Alcohol Use Not Currently     Social History    Substance and Sexual Activity  Drug Use Not Currently    Social History   Socioeconomic History  . Marital status: Single    Spouse name: Not on file  . Number of children: Not on file  . Years of education: Not on file  . Highest education level: Not on file  Occupational History  . Not on file  Tobacco Use  . Smoking status: Light Tobacco Smoker    Packs/day: 0.25    Types: Cigarettes  . Smokeless tobacco: Never Used  Vaping Use  . Vaping Use: Never used  Substance and Sexual Activity  . Alcohol use: Not Currently  . Drug use: Not Currently  . Sexual activity: Not on file  Other Topics Concern  . Not on file  Social History Narrative  . Not on file   Social Determinants of Health   Financial Resource Strain: Not on file  Food Insecurity: Not on file  Transportation Needs: Not on file  Physical Activity: Not on file  Stress: Not on file  Social Connections: Not on file   Additional Social History:                         Sleep: Fair  Appetite:  Fair  Current Medications: Current Facility-Administered Medications  Medication Dose Route Frequency Provider Last Rate Last Admin  . acetaminophen (TYLENOL) tablet 650 mg  650 mg Oral Q6H PRN [name], [name], MD   650 mg at 09/22/20 0331  . alum & mag hydroxide-simeth (MAALOX/MYLANTA) 200-200-20 MG/5ML suspension 30 mL  30 mL Oral Q4H PRN [name], [name], MD      .  diphenhydrAMINE (BENADRYL) injection 50 mg  50 mg Intramuscular Q6H PRN [name], [name], MD      . haloperidol lactate (HALDOL) injection 5 mg  5 mg Intramuscular Q6H PRN [name], [name], MD      . hydrOXYzine (ATARAX/VISTARIL) tablet 50 mg  50 mg Oral TID PRN [name], [name], MD   50 mg at 09/22/20 0331  . LORazepam (ATIVAN) tablet 2 mg  2 mg Oral Q4H PRN [name], [name], MD   2 mg at 09/19/20 2132   Or  . LORazepam (ATIVAN) injection 2 mg  2 mg Intramuscular Q4H PRN [name], [name], MD      . lurasidone (LATUDA) tablet 20 mg  20 mg Oral Q  supper [name], MD   20 mg at 09/26/20 1740  . magnesium hydroxide (MILK OF MAGNESIA) suspension 30 mL  30 mL Oral Daily PRN [name], [name], MD      . OLANZapine (ZYPREXA) injection 30 mg  30 mg Intramuscular QHS PRN [name], [name], MD      . OLANZapine zydis (ZYPREXA) disintegrating tablet 20 mg  20 mg Oral QHS [name], MD   20 mg at 09/26/20 2117    Lab Results:  No results found for this or any previous visit (from the past 48 hour(s)).  Blood Alcohol level:  Lab Results  Component Value Date   ETH <10 08/11/2020   ETH <10 04/29/2020    Metabolic Disorder Labs: Lab Results  Component Value Date   HGBA1C 5.2 10/23/2018   MPG 102.54 10/23/2018   No results found for: PROLACTIN Lab Results  Component Value Date   CHOL 129 10/23/2018   TRIG 58 10/23/2018   HDL 50 10/23/2018   CHOLHDL 2.6 10/23/2018   VLDL 12 10/23/2018   LDLCALC 67 10/23/2018    Physical Findings: AIMS:  , ,  ,  ,    CIWA:    COWS:     Musculoskeletal: Strength & Muscle Tone: within normal limits Gait & Station: normal Patient leans: N/A  Psychiatric Specialty Exam: Physical Exam Vitals and nursing note reviewed.  Constitutional:      Appearance: Normal appearance.  HENT:     Head: Normocephalic and atraumatic.     Right Ear: External ear normal.     Left Ear: External ear normal.     Nose: Nose normal.     Mouth/Throat:     Mouth: Mucous membranes are moist.     Pharynx: Oropharynx is clear.  Eyes:     Extraocular Movements: Extraocular movements intact.     Conjunctiva/sclera: Conjunctivae normal.     Pupils: Pupils are equal, round, and reactive to light.  Cardiovascular:     Rate and Rhythm: Normal rate.     Pulses: Normal pulses.  Pulmonary:     Effort: Pulmonary effort is normal.     Breath sounds: Normal breath sounds.  Abdominal:     General: Abdomen is flat.     Palpations: Abdomen is soft.  Musculoskeletal:        General: No swelling. Normal range of  motion.     Cervical back: Normal range of motion and neck supple.  Skin:    General: Skin is warm and dry.  Neurological:     General: No focal deficit present.     Mental Status: He is alert and oriented to person, place, and time.  Psychiatric:        Attention and Perception: He  is attentive. He does not perceive auditory or visual hallucinations.        Mood and Affect: Mood and affect normal. Mood is not depressed. Affect is not tearful.        Speech: Speech normal. Speech is not tangential.        Behavior: Behavior is cooperative.        Thought Content: Thought content does not include homicidal or suicidal ideation.        Cognition and Memory: Cognition and memory normal.        Judgment: Judgment is impulsive.     Review of Systems  Constitutional: Positive for fatigue. Negative for appetite change.  HENT: Negative for rhinorrhea and sore throat.   Eyes: Negative for photophobia and visual disturbance.  Respiratory: Negative for cough and shortness of breath.   Cardiovascular: Negative for chest pain and palpitations.  Gastrointestinal: Negative for constipation, diarrhea, nausea and vomiting.  Endocrine: Negative for cold intolerance and heat intolerance.  Genitourinary: Negative for difficulty urinating and dysuria.  Musculoskeletal: Negative for arthralgias and myalgias.  Skin: Negative for rash and wound.  Allergic/Immunologic: Negative for environmental allergies and food allergies.  Neurological: Negative for dizziness and headaches.  Hematological: Negative for adenopathy. Does not bruise/bleed easily.  Psychiatric/Behavioral: Negative for behavioral problems, decreased concentration, dysphoric mood and sleep disturbance. The patient is not hyperactive.     Blood pressure 102/81, pulse 77, temperature (!) 97.5 F (36.4 C), temperature source Oral, resp. rate 18, height 6' (1.829 m), weight 111.1 kg, SpO2 99 %.Body mass index is 33.23 kg/m.  General Appearance:  Fairly Groomed  Eye Contact:  Good  Speech:  Normal Rate  Volume:  Normal  Mood:  Euthymic  Affect:  Congruent  Thought Process:  Goal-oriented today  Orientation:  Full (Time, Place, and Person)  Thought Content:  Logical  Suicidal Thoughts:  No  Homicidal Thoughts:  No  Memory:  Immediate;   Fair Recent;   Fair Remote;   Fair  Judgement:  Intact  Insight:  Shallow  Psychomotor Activity:  Normal  Concentration:  Concentration: Fair  Recall:  [employer] of Knowledge:  Fair  Language:  Fair  Akathisia:  Negative  Handed:  Right  AIMS (if indicated):     Assets:  Communication Skills Desire for Improvement Financial Resources/Insurance Housing  ADL's:  Intact  Cognition:  Impaired,  Mild  Sleep:  Number of Hours: 4.75     Treatment Plan Summary: Daily contact with patient to assess and evaluate symptoms and progress in treatment and Medication management  35 yo male with schizoaffective disorder, bipolar type. Patient agreeable to Zyprexa 20 mg QHS and Latuda 20 mg with supper. Will continue each at this time. Patient showing some improved insight as he is able to tell these medications are helping him control his sadness and irritability. Calm and cooperative on unit.   09/27/20: Psychiatric exam above reviewed and remains accurate. Assessment and plan above reviewed and updated.     [name], MD 09/27/2020, 12:04 PM

## 2020-09-27 NOTE — [facility] Group Notes (Signed)
[facility] Group Notes:  (Nursing/MHT/Case Management/Adjunct)  Date:  09/27/2020  Time:  9:29 PM  Type of Therapy:  Group Therapy  Participation Level:  Active  Participation Quality:  Redirectable  Affect:  Excited  Cognitive:  Alert  Insight:  Good  Engagement in Group:  Distracting, Engaged and Monopolizing  Modes of Intervention:  Support  Summary of Progress/Problems:  [name] 09/27/2020, 9:29 PM

## 2020-09-27 NOTE — Progress Notes (Signed)
BRIEF PHARMACY NOTE   This patient attended and participated in Medication Management Group counseling led by [facility] staff pharmacist.  This interactive class reviews basic information about prescription medications and education on personal responsibility in medication management.  The class also includes general knowledge of 3 main classes of behavioral medications, including antipsychotics, antidepressants, and mood stabilizers.     Patient behavior was appropriate for group setting.   Educational materials sourced from:  "Medication Do's and Don'ts" from [email or web address]   "Mental Health Medications" from [facility] of Mental Health FaxRack.[email or web address].shtml#part 110211    Albina Billet, PharmD, BCPS Clinical Pharmacist 09/27/2020 3:01 PM

## 2020-09-27 NOTE — [facility] Group Notes (Signed)
LCSW Group Therapy Note     09/27/2020 2:12 PM     Type of Therapy/Topic:  Group Therapy:  Balance in Life     Participation Level:  Active     Description of Group:    This group will address the concept of balance and how it feels and looks when one is unbalanced. Patients will be encouraged to process areas in their lives that are out of balance and identify reasons for remaining unbalanced. Facilitators will guide patients in utilizing problem-solving interventions to address and correct the stressor making their life unbalanced. Understanding and applying boundaries will be explored and addressed for obtaining and maintaining a balanced life. Patients will be encouraged to explore ways to assertively make their unbalanced needs known to significant others in their lives, using other group members and facilitator for support and feedback.     Therapeutic Goals:  1.      Patient will identify two or more emotions or situations they have that consume much of in their lives.  2.      Patient will identify signs/triggers that life has become out of balance:  3.      Patient will identify two ways to set boundaries in order to achieve balance in their lives:  4.      Patient will demonstrate ability to communicate their needs through discussion and/or role plays     Summary of Patient Progress:   Patient stated that he could identify his triggers and situations that unbalanced him emotionally. Pt stated that when he experienced anger for the first time he had to learn how to deal with it and not just suppress it. CSW discussed how helpful it is to identify triggers and use methods of coping. Pt stated that breathing and crying can be helpful methods.     Therapeutic Modalities:   Cognitive Behavioral Therapy  Solution-Focused Therapy  Assertiveness Training     Ritu Gagliardo [place] MSW, LCSW-A  09/27/2020 2:12 PM

## 2020-09-28 MED ORDER — OLANZAPINE 20 MG PO TABS: 20.0000 mg | ORAL_TABLET | Freq: Every day | ORAL | 1 refills | Status: DC

## 2020-09-28 MED ORDER — LURASIDONE HCL 20 MG PO TABS: 20.0000 mg | ORAL_TABLET | Freq: Every day | ORAL | 1 refills | Status: DC

## 2020-09-28 NOTE — Progress Notes (Signed)
Patient is pleasant and cooperative. Less intrusive. Pt excited about being discharged. Denies any Si, HI, AVH. Reports going home to his apt and trying to discontinue guardianship from his mother. Pt visible in milieu. Noted smiling and joking with staff and peers. No complaints voiced. Encouragement and support provided. Safety checks maintained. Medications given as prescribed. Pt receptive and remains safe on unit with q 15 min checks.

## 2020-09-28 NOTE — Discharge Summary (Signed)
Physician Discharge Summary Note  Patient:  Jackson [name] is an 35 y.o., male MRN:  [id number] DOB:  14-Aug-1985 Patient phone:  [phone] (home)  Patient address:   [facility] [place] [address],  Total Time spent with patient: 35 minutes- 25 minutes face-to-face contact with patient, 10 minutes documentation, coordination of care, scripts   Date of Admission:  09/17/2020 Date of Discharge: 09/28/2018  Reason for Admission:  Mania, psychosis, and aggression  Principal Problem: Schizoaffective disorder, bipolar type [facility]) Discharge Diagnoses: Principal Problem:   Schizoaffective disorder, bipolar type (HCC) Active Problems:   Noncompliance   Past Psychiatric History: History of schizoaffective disorder, bipolar type. Multiple past hospilizations with extended length of stay at [facility] and state hospitals. Previously stabilized on Abilify, Zyprexa, and Depakote. No past suicide attempts, history of aggression.   Past Medical History:  Past Medical History:  Diagnosis Date  . Bipolar 1 disorder (HCC)   . Schizoaffective disorder (HCC)    History reviewed. No pertinent surgical history. Family History: History reviewed. No pertinent family history. Family Psychiatric  History: History of schizoaffective disorder, bipolar type. Multiple past hospilizations with extended length of stay at [facility] and state hospitals. Previously stabilized on Abilify, Zyprexa, and Depakote. No past suicide attempts, history of aggression.  Social History:  Social History   Substance and Sexual Activity  Alcohol Use Not Currently     Social History   Substance and Sexual Activity  Drug Use Not Currently    Social History   Socioeconomic History  . Marital status: Single    Spouse name: Not on file  . Number of children: Not on file  . Years of education: Not on file  . Highest education level: Not on file  Occupational History  . Not on file  Tobacco Use  . Smoking status: Light Tobacco  Smoker    Packs/day: 0.25    Types: Cigarettes  . Smokeless tobacco: Never Used  Vaping Use  . Vaping Use: Never used  Substance and Sexual Activity  . Alcohol use: Not Currently  . Drug use: Not Currently  . Sexual activity: Not on file  Other Topics Concern  . Not on file  Social History Narrative  . Not on file   Social Determinants of Health   Financial Resource Strain: Not on file  Food Insecurity: Not on file  Transportation Needs: Not on file  Physical Activity: Not on file  Stress: Not on file  Social Connections: Not on file    Hospital Course:  35 year old male with schizoaffective disorder, bipolar type who presented to the emergency room on Jan 8th. Patient had an extended stay in [facility] due to intermittent aggression, and need for PRN medications. While in the emergency room he smashed a hole in the wall, and required restraints. He also made several calls to the [facility] and police. He was also noted to spit on the floor, have bowel movements on the floor, and bang on the doors to curse at staff. While in the emergency room he was restarted on Depakote and Zyprexa which he was intermittently compliant with. He gradually improved and did not require PRNs since Jan 27th, and was transferred down the the behavioral medicine unit.   While on the unit, patient remained noncompliant with Depakote citing sedation and weight gain as reasons for not wanting to start this medication back. He was compliant with Zyprexa 20 mg at night. He also eventually agreed to starting Latuda 20  mg with supper. He declined restarting Haldol, Abilify, or Invega to transition to a long-acting injectable. While on our behavioral medicine unit patient had no behavioral outbursts, did not require PRNs, restraints, or seclusion. Tayveon was agreeable to continuing medications at discharge, and following up with ACT Team. His mother/legal guardian and step father  were contacted prior to discharge, and they agreed to discharge plans. He denies suicidal ideations, homicidal ideations, visual hallucinations, and auditory hallucinations.   Physical Findings: AIMS: Facial and Oral Movements Muscles of Facial Expression: None, normal Lips and Perioral Area: None, normal Jaw: None, normal Tongue: None, normal,Extremity Movements Upper (arms, wrists, hands, fingers): None, normal Lower (legs, knees, ankles, toes): None, normal, Trunk Movements Neck, shoulders, hips: None, normal, Overall Severity Severity of abnormal movements (highest score from questions above): None, normal Incapacitation due to abnormal movements: None, normal Patient's awareness of abnormal movements (rate only patient's report): No Awareness, Dental Status Current problems with teeth and/or dentures?: No Does patient usually wear dentures?: No  CIWA:    COWS:     Musculoskeletal: Strength & Muscle Tone: within normal limits Gait & Station: normal Patient leans: N/A  Psychiatric Specialty Exam: Physical Exam Vitals and nursing note reviewed.  Constitutional:      Appearance: Normal appearance.  HENT:     Head: Normocephalic and atraumatic.     Right Ear: External ear normal.     Left Ear: External ear normal.     Nose: Nose normal.     Mouth/Throat:     Mouth: Mucous membranes are moist.     Pharynx: Oropharynx is clear.  Eyes:     Extraocular Movements: Extraocular movements intact.     Conjunctiva/sclera: Conjunctivae normal.     Pupils: Pupils are equal, round, and reactive to light.  Cardiovascular:     Rate and Rhythm: Normal rate.     Pulses: Normal pulses.  Pulmonary:     Effort: Pulmonary effort is normal.     Breath sounds: Normal breath sounds.  Abdominal:     General: Abdomen is flat.     Palpations: Abdomen is soft.  Musculoskeletal:        General: No swelling. Normal range of motion.     Cervical back: Normal range of motion and neck supple.   Skin:    General: Skin is warm and dry.  Neurological:     General: No focal deficit present.     Mental Status: He is alert and oriented to person, place, and time.  Psychiatric:        Mood and Affect: Mood normal.        Behavior: Behavior normal.        Thought Content: Thought content normal.        Judgment: Judgment normal.     Review of Systems  Constitutional: Positive for appetite change and fatigue.  HENT: Negative for rhinorrhea and sore throat.   Eyes: Negative for photophobia and visual disturbance.  Respiratory: Negative for cough and shortness of breath.   Cardiovascular: Negative for chest pain and palpitations.  Gastrointestinal: Negative for constipation, diarrhea, nausea and vomiting.  Endocrine: Negative for cold intolerance and heat intolerance.  Genitourinary: Negative for difficulty urinating and dysuria.  Musculoskeletal: Negative for arthralgias and myalgias.  Skin: Negative for rash and wound.  Allergic/Immunologic: Negative for environmental allergies and food allergies.  Neurological: Negative for dizziness and headaches.  Hematological: Negative for adenopathy. Does not bruise/bleed easily.  Psychiatric/Behavioral: Positive for agitation, decreased concentration and dysphoric  mood. Negative for suicidal ideas.    Blood pressure 100/78, pulse 67, temperature 98 F (36.7 C), temperature source Oral, resp. rate 17, height 6' (1.829 m), weight 111.1 kg, SpO2 100 %.Body mass index is 33.23 kg/m.  General Appearance: Fairly Groomed  Eye Contact:  Fair  Speech:  Pressured  Volume:  Increased  Mood:  Irritable  Affect:  Congruent  Thought Process:  Disorganized  Orientation:  Full (Time, Place, and Person)  Thought Content:  Delusions, Paranoid Ideation and Tangential  Suicidal Thoughts:  No  Homicidal Thoughts:  No  Memory:  Immediate;   Fair Recent;   Poor Remote;   Poor  Judgement:  Impaired  Insight:  Lacking  Psychomotor Activity:   Restlessness  Concentration:  Concentration: Fair  Recall:  Poor  Fund of Knowledge:  Poor  Language:  Fair  Akathisia:  Negative  Handed:  Right  AIMS (if indicated):     Assets:  Financial Resources/Insurance Housing Social Support  ADL's:  Intact  Cognition:  Impaired,  Mild  Sleep:  Number of Hours: 5.5           Has this patient used any form of tobacco in the last 30 days? (Cigarettes, Smokeless Tobacco, Cigars, and/or Pipes) No  Blood Alcohol level:  Lab Results  Component Value Date   ETH <10 08/11/2020   ETH <10 04/29/2020    Metabolic Disorder Labs:  Lab Results  Component Value Date   HGBA1C 5.2 10/23/2018   MPG 102.54 10/23/2018   No results found for: PROLACTIN Lab Results  Component Value Date   CHOL 129 10/23/2018   TRIG 58 10/23/2018   HDL 50 10/23/2018   CHOLHDL 2.6 10/23/2018   VLDL 12 10/23/2018   LDLCALC 67 10/23/2018    See Psychiatric Specialty Exam and Suicide Risk Assessment completed by Attending Physician prior to discharge.  Discharge destination:  Home  Is patient on multiple antipsychotic therapies at discharge:  Yes,   Do you recommend tapering to monotherapy for antipsychotics?  No   Has Patient had three or more failed trials of antipsychotic monotherapy by history:  Yes,   Antipsychotic medications that previously failed include:   1.  Invega., 2.  Abilify. and 3.  Zyprexa.  Recommended Plan for Multiple Antipsychotic Therapies: NA  Discharge Instructions    Diet general   Complete by: As directed    Increase activity slowly   Complete by: As directed      Allergies as of 09/28/2020   No Known Allergies     Medication List    STOP taking these medications   ARIPiprazole ER 400 MG Srer injection Commonly known as: ABILIFY MAINTENA   divalproex 500 MG 24 hr tablet Commonly known as: DEPAKOTE ER   traZODone 100 MG tablet Commonly known as: DESYREL     TAKE these medications     Indication  lurasidone 20 MG  Tabs tablet Commonly known as: LATUDA Take 1 tablet (20 mg total) by mouth daily with supper.  Indication: Depressive Phase of Manic-Depression   OLANZapine 20 MG tablet Commonly known as: ZyPREXA Take 1 tablet (20 mg total) by mouth at bedtime.  Indication: Manic Phase of Manic-Depression       Follow-up Information    [facility] Follow up on 09/29/2020.   Why: ACTT team will follow up with pt at his place of residence between 11am-2pm Contact information: [address] [place], [place] [address] Phone: [phone] [phone]  Follow-up recommendations:  Activity:  as tolerated Diet:  regular diet  Comments:  30-day scripts with one refill sent to [facility] on [address]  Signed: Jesse SansMegan M Arslan Kier, MD 09/28/2020, 10:03 AM

## 2020-09-28 NOTE — Progress Notes (Signed)
Pt denies SI, HI and AVH. Pt was educated on dc plan and verbalizes understanding. Pt was given belongings and dc packet. Torrie Mayers RN

## 2020-09-28 NOTE — Plan of Care (Signed)
Pt denies depression, anxiety, SI, HI and AVH. Pt was educated on care plan and verbalizes understanding. Pt anticipates discharge. Torrie Mayers RN  Problem: Education: Goal: Charity fundraiser Education information/materials will improve Outcome: Progressing   Problem: Health Behavior/Discharge Planning: Goal: Identification of resources available to assist in meeting health care needs will improve Outcome: Progressing Goal: Compliance with treatment plan for underlying cause of condition will improve Outcome: Progressing   Problem: Coping: Goal: Ability to identify and develop effective coping behavior will improve Outcome: Progressing Goal: Ability to interact with others will improve Outcome: Progressing Goal: Demonstration of participation in decision-making regarding own care will improve Outcome: Progressing Goal: Ability to use eye contact when communicating with others will improve Outcome: Progressing   Problem: Self-Concept: Goal: Will verbalize positive feelings about self Outcome: Progressing

## 2020-09-28 NOTE — Progress Notes (Signed)
  [facility] Adult Case Management Discharge Plan :  Will you be returning to the same living situation after discharge:  Yes,  pt reports that he is returning home.  At discharge, do you have transportation home?: Yes,  CSW will assist with transportation needs. Do you have the ability to pay for your medications: Yes,  Medicaid.  Release of information consent forms completed and in the chart;  Patient's signature needed at discharge.  Patient to Follow up at:  Follow-up Information    [facility] Follow up on 09/29/2020.   Why: ACTT team will follow up with pt at his place of residence between 11am-2pm Contact information: [address] [place], [place] [address] Phone: [phone] [phone]              Next level of care provider has access to [facility] Link:no  Safety Planning and Suicide Prevention discussed: Yes,  SPE completed with the patient's guardian.     Has patient been referred to the Quitline?: Patient refused referral  Patient has been referred for addiction treatment: Pt. refused referral  Harden Mo, LCSW 09/28/2020, 11:03 AM

## 2020-09-28 NOTE — [facility] Group Notes (Signed)
[facility] Group Notes:  (Nursing/MHT/Case Management/Adjunct)  Date:  09/28/2020  Time:  9:38 AM  Type of Therapy:  Community Meeting  Participation Level:  Active  Participation Quality:  Attentive  Affect:  Appropriate  Cognitive:  Appropriate  Insight:  Appropriate  Engagement in Group:  Engaged  Modes of Intervention:  Activity and Education  Summary of Progress/Problems:  Jackson [name] [facility] 09/28/2020, 9:38 AM

## 2020-09-28 NOTE — Progress Notes (Signed)
Recreation Therapy Notes  INPATIENT RECREATION TR PLAN  Patient Details Name: Jackson [name] MRN: [id number] DOB: 1986/01/30 Today's Date: 09/28/2020  Rec Therapy Plan Is patient appropriate for Therapeutic Recreation?: Yes Treatment times per week: at least 3 Estimated Length of Stay: 5-7 days TR Treatment/Interventions: Group participation (Comment)  Discharge Criteria Pt will be discharged from therapy if:: Discharged Treatment plan/goals/alternatives discussed and agreed upon by:: Patient/family  Discharge Summary Short term goals set: Patient will engage in groups without prompting or encouragement from LRT x3 group [place] within 5 recreation therapy group [place] Short term goals met: Complete Progress toward goals comments: Groups attended Which groups?: Stress management,Leisure education,Other (Comment) (Happiness, Problem solving, Time management) Reason goals not met: N/A Therapeutic equipment acquired: N/A Reason patient discharged from therapy: Discharge from hospital Pt/family agrees with progress & goals achieved: Yes Date patient discharged from therapy: 09/28/20   [name] 09/28/2020, 3:02 PM

## 2020-09-28 NOTE — [facility] Suicide Risk Assessment (Signed)
[facility] Discharge Suicide Risk Assessment   Principal Problem: Schizoaffective disorder, bipolar type [facility]) Discharge Diagnoses: Principal Problem:   Schizoaffective disorder, bipolar type (HCC) Active Problems:   Noncompliance   Total Time spent with patient: 35 minutes- 25 minutes face-to-face contact with patient, 10 minutes documentation, coordination of care, scripts   Musculoskeletal: Strength & Muscle Tone: within normal limits Gait & Station: normal Patient leans: N/A  Psychiatric Specialty Exam: Review of Systems  Constitutional: Negative for appetite change and fatigue.  HENT: Negative for rhinorrhea and sore throat.   Eyes: Negative for photophobia and visual disturbance.  Respiratory: Negative for cough and shortness of breath.   Cardiovascular: Negative for chest pain and palpitations.  Gastrointestinal: Negative for constipation, diarrhea, nausea and vomiting.  Endocrine: Negative for cold intolerance and heat intolerance.  Genitourinary: Negative for difficulty urinating and dysuria.  Musculoskeletal: Negative for arthralgias and myalgias.  Skin: Negative for rash and wound.  Allergic/Immunologic: Negative for environmental allergies.  Neurological: Negative for dizziness and headaches.  Hematological: Negative for adenopathy. Does not bruise/bleed easily.  Psychiatric/Behavioral: Negative for agitation, behavioral problems, hallucinations, sleep disturbance and suicidal ideas.    Blood pressure 105/74, pulse 69, temperature 97.9 F (36.6 C), temperature source Oral, resp. rate 18, height 6' (1.829 m), weight 111.1 kg, SpO2 99 %.Body mass index is 33.23 kg/m.  General Appearance: Fairly Groomed  Patent attorney::  Good  Speech:  Clear and Coherent and Normal Rate  Volume:  Normal  Mood:  Euthymic  Affect:  Congruent  Thought Process:  Coherent and Linear  Orientation:  Full (Time, Place, and Person)  Thought Content:  Logical  Suicidal Thoughts:  No  Homicidal  Thoughts:  No  Memory:  Immediate;   Fair Recent;   Fair Remote;   Fair  Judgement:  Intact  Insight:  Present  Psychomotor Activity:  Normal  Concentration:  Fair  Recall:  [employer] of Knowledge:Good  Language: Fair  Akathisia:  Negative  Handed:  Right  AIMS (if indicated):     Assets:  Communication Skills Desire for Improvement Financial Resources/Insurance Housing Physical Health Resilience Social Support  Sleep:  Number of Hours: 4.75  Cognition: WNL  ADL's:  Intact   Mental Status Per Nursing Assessment::   On Admission:  NA  Demographic Factors:  Male and Living alone  Loss Factors: NA  Historical Factors: Impulsivity  Risk Reduction Factors:   Positive social support, Positive therapeutic relationship and Positive coping skills or problem solving skills  Continued Clinical Symptoms:  Bipolar Disorder:   Mixed State Previous Psychiatric Diagnoses and Treatments  Cognitive Features That Contribute To Risk:  None    Suicide Risk:  Minimal: No identifiable suicidal ideation.  Patients presenting with no risk factors but with morbid ruminations; may be classified as minimal risk based on the severity of the depressive symptoms   Follow-up Information    [facility] Follow up on 09/29/2020.   Why: ACTT team will follow up with pt at his place of residence between 11am-2pm Contact information: [address] [place], [place] [address] Phone: [phone] [phone]              Plan Of Care/Follow-up recommendations:  Activity:  as tolerated Diet:  regular diet  [name], MD 09/28/2020, 9:55 AM

## 2020-09-28 NOTE — B[facility] Counselor (Signed)
CSW received verbal consent for Request and Authorization for Use/Disclosure for follow up provider from pt's guardian, Jena Gauss on 09/28/23.  Traxton Kolenda [place], MSW, LCSW-A 2/25/20224:28 PM

## 2020-09-28 NOTE — Progress Notes (Signed)
Recreation Therapy Notes   Date: 09/28/2020  Time: 9:30 am   Location: Craft room   Behavioral response: Appropriate  Intervention Topic: Happiness   Discussion/Intervention:  Group content today was focused on Happiness. The group defined happiness and described where happiness comes from. Individuals identified what makes them happy and how they go about making others happy. Patients expressed things that stop them from being happy and ways they can improve their happiness. The group stated reasons why it is important to be happy. The group participated in the intervention "My Happiness", where they had a chance to identify and express things that make them happy. Clinical Observations/Feedback: Patient came to group late.  Andric Kerce LRT/CTRS           Rekisha Welling 09/28/2020 12:20 PM

## 2020-09-28 NOTE — Plan of Care (Signed)
  Problem: Group Participation Goal: STG - Patient will engage in groups without prompting or encouragement from LRT x3 group [place] within 5 recreation therapy group [place] Description: STG - Patient will engage in groups without prompting or encouragement from LRT x3 group [place] within 5 recreation therapy group [place] Outcome: Completed/Met

## 2020-10-17 ENCOUNTER — Encounter: Payer: Self-pay | Admitting: Emergency Medicine

## 2020-10-17 ENCOUNTER — Emergency Department
Admission: EM | Admit: 2020-10-17 | Discharge: 2020-10-20 | Disposition: A | Discharge status: Home / Self Care | Payer: No Typology Code available for payment source | Attending: Emergency Medicine | Admitting: Emergency Medicine

## 2020-10-17 DIAGNOSIS — F3162 Bipolar disorder, current episode mixed, moderate: Secondary | ICD-10-CM | POA: Insufficient documentation

## 2020-10-17 DIAGNOSIS — R259 Unspecified abnormal involuntary movements: Secondary | ICD-10-CM | POA: Diagnosis present

## 2020-10-17 DIAGNOSIS — F17210 Nicotine dependence, cigarettes, uncomplicated: Secondary | ICD-10-CM | POA: Insufficient documentation

## 2020-10-17 DIAGNOSIS — F29 Unspecified psychosis not due to a substance or known physiological condition: Secondary | ICD-10-CM | POA: Insufficient documentation

## 2020-10-17 DIAGNOSIS — Z046 Encounter for general psychiatric examination, requested by authority: Secondary | ICD-10-CM | POA: Diagnosis not present

## 2020-10-17 DIAGNOSIS — F250 Schizoaffective disorder, bipolar type: Secondary | ICD-10-CM | POA: Diagnosis present

## 2020-10-17 DIAGNOSIS — Z20822 Contact with and (suspected) exposure to covid-19: Secondary | ICD-10-CM | POA: Diagnosis not present

## 2020-10-17 DIAGNOSIS — Z79899 Other long term (current) drug therapy: Secondary | ICD-10-CM | POA: Diagnosis not present

## 2020-10-17 DIAGNOSIS — E876 Hypokalemia: Secondary | ICD-10-CM | POA: Diagnosis not present

## 2020-10-17 LAB — CBC WITH DIFFERENTIAL/PLATELET
Abs Immature Granulocytes: 0.01 10*3/uL (ref 0.00–0.07)
Basophils Absolute: 0 10*3/uL (ref 0.0–0.1)
Basophils Relative: 1 %
Eosinophils Absolute: 0.1 10*3/uL (ref 0.0–0.5)
Eosinophils Relative: 1 %
HCT: 43.9 % (ref 39.0–52.0)
Hemoglobin: 14.8 g/dL (ref 13.0–17.0)
Immature Granulocytes: 0 %
Lymphocytes Relative: 48 %
Lymphs Abs: 2.9 10*3/uL (ref 0.7–4.0)
MCH: 31.5 pg (ref 26.0–34.0)
MCHC: 33.7 g/dL (ref 30.0–36.0)
MCV: 93.4 fL (ref 80.0–100.0)
Monocytes Absolute: 0.7 10*3/uL (ref 0.1–1.0)
Monocytes Relative: 12 %
Neutro Abs: 2.3 10*3/uL (ref 1.7–7.7)
Neutrophils Relative %: 38 %
Platelets: 257 10*3/uL (ref 150–400)
RBC: 4.7 MIL/uL (ref 4.22–5.81)
RDW: 13 % (ref 11.5–15.5)
WBC: 6 10*3/uL (ref 4.0–10.5)
nRBC: 0 % (ref 0.0–0.2)

## 2020-10-17 LAB — ETHANOL: Alcohol, Ethyl (B): <10 mg/dL (ref <10)

## 2020-10-17 LAB — COMPREHENSIVE METABOLIC PANEL
ALT: 15 U/L (ref 0–44)
AST: 35 U/L (ref 15–41)
Albumin: 4.8 g/dL (ref 3.5–5.0)
Alkaline Phosphatase: 55 U/L (ref 38–126)
Anion gap: 13 (ref 5–15)
BUN: 17 mg/dL (ref 6–20)
CO2: 22 mmol/L (ref 22–32)
Calcium: 9.4 mg/dL (ref 8.9–10.3)
Chloride: 104 mmol/L (ref 98–111)
Creatinine, Ser: 1.58 mg/dL — ABNORMAL HIGH (ref 0.61–1.24)
GFR, Estimated: 58 mL/min — ABNORMAL LOW (ref >60)
Glucose, Bld: 118 mg/dL — ABNORMAL HIGH (ref 70–99)
Potassium: 3.1 mmol/L — ABNORMAL LOW (ref 3.5–5.1)
Sodium: 139 mmol/L (ref 135–145)
Total Bilirubin: 1.9 mg/dL — ABNORMAL HIGH (ref 0.3–1.2)
Total Protein: 8 g/dL (ref 6.5–8.1)

## 2020-10-17 LAB — SALICYLATE LEVEL: Salicylate Lvl: <7 mg/dL — ABNORMAL LOW (ref 7.0–30.0)

## 2020-10-17 LAB — ACETAMINOPHEN LEVEL: Acetaminophen (Tylenol), Serum: <10 ug/mL — ABNORMAL LOW (ref 10–30)

## 2020-10-17 MED ORDER — OLANZAPINE 5 MG PO TBDP
20.0000 mg | ORAL_TABLET | Freq: Every day | ORAL | Status: DC
  Administered 2020-10-17: 20 mg via ORAL
  Filled 2020-10-17 (×2): qty 4

## 2020-10-17 MED ORDER — ZIPRASIDONE MESYLATE 20 MG IM SOLR
10.0000 mg | Freq: Once | INTRAMUSCULAR | Status: AC
  Administered 2020-10-17: 10 mg via INTRAMUSCULAR
  Filled 2020-10-17: qty 20

## 2020-10-17 MED ORDER — LURASIDONE HCL 40 MG PO TABS
20.0000 mg | ORAL_TABLET | Freq: Every day | ORAL | Status: DC
  Filled 2020-10-17 (×3): qty 1

## 2020-10-17 MED ORDER — POTASSIUM CHLORIDE CRYS ER 20 MEQ PO TBCR: 40.0000 meq | EXTENDED_RELEASE_TABLET | Freq: Once | ORAL | Status: DC

## 2020-10-17 NOTE — ED Notes (Signed)
Pt refused Covid swab.  Pt was talking about different dimensions in the universe.

## 2020-10-17 NOTE — Consult Note (Signed)
[facility]   Reason for Consult: Consult for 35 year old man with a history of schizoaffective disorder brought in after being found naked walking outdoors Referring Physician: Katrinka BlazingSmith Patient Identification: Jackson Mellowerrell Marcinek MRN:  [id number] Principal Diagnosis: Schizoaffective disorder, bipolar type (HCC) Diagnosis:  Principal Problem:   Schizoaffective disorder, bipolar type (HCC)   Total Time spent with patient: 1 hour  Subjective:   Jackson [name] is a 35 y.o. male patient admitted with "I did not do anything wrong".  HPI: Patient seen chart reviewed.  This patient who is well known to us with a history of schizoaffective disorder was brought to the hospital by law enforcement after being picked up with a report that he was naked walking around outside at night.  The notes in the chart from when he first presented suggest that he was disorganized with confused thinking and behavior.  On interview this morning the patient is calmer.  He is not his joyful and hyperactive self but rather is withdrawn and irritable but not hostile.  He insists that he had not done anything wrong and that he was not in fact naked.  He says the pants he was wearing made him look like he was naked because they were black in color.  Patient claims he has been compliant with his medicine.  Denies substance abuse.  Reports that he feels that his mood is been fine denies any health problems denies hallucinations.  Collateral information already documented from his family indicates they feel that he has been noncompliant and agitated showing bizarre behavior already.  Past Psychiatric History: Patient has a history of schizoaffective disorder with presentations usually consisting of manic-like behavior often with hyper verbality hyperactivity grandiosity and a tendency to get himself in a lot of trouble out in the world with his bizarre behavior.  Does not have a history of actually harming anyone  or himself.  Risk to Self:   Risk to Others:   Prior Inpatient Therapy:   Prior Outpatient Therapy:    Past Medical History:  Past Medical History:  Diagnosis Date  . Bipolar 1 disorder (HCC)   . Schizoaffective disorder (HCC)    History reviewed. No pertinent surgical history. Family History: History reviewed. No pertinent family history. Family Psychiatric  History: None reported Social History:  Social History   Substance and Sexual Activity  Alcohol Use Not Currently     Social History   Substance and Sexual Activity  Drug Use Not Currently    Social History   Socioeconomic History  . Marital status: Single    Spouse name: Not on file  . Number of children: Not on file  . Years of education: Not on file  . Highest education level: Not on file  Occupational History  . Not on file  Tobacco Use  . Smoking status: Light Tobacco Smoker    Packs/day: 0.25    Types: Cigarettes  . Smokeless tobacco: Never Used  Vaping Use  . Vaping Use: Never used  Substance and Sexual Activity  . Alcohol use: Not Currently  . Drug use: Not Currently  . Sexual activity: Not on file  Other Topics Concern  . Not on file  Social History Narrative  . Not on file   Social Determinants of Health   Financial Resource Strain: Not on file  Food Insecurity: Not on file  Transportation Needs: Not on file  Physical Activity: Not on file  Stress: Not on file  Social Connections: Not on file  Additional Social History:    Allergies:  No Known Allergies  Labs:  Results for orders placed or performed during the hospital encounter of 10/17/20 (from the past 48 hour(s))  Comprehensive metabolic panel     Status: Abnormal   Collection Time: 10/17/20  6:11 AM  Result Value Ref Range   Sodium 139 135 - 145 mmol/L   Potassium 3.1 (L) 3.5 - 5.1 mmol/L   Chloride 104 98 - 111 mmol/L   CO2 22 22 - 32 mmol/L   Glucose, Bld 118 (H) 70 - 99 mg/dL    Comment: Glucose reference range  applies only to samples taken after fasting for at least 8 hours.   BUN 17 6 - 20 mg/dL   Creatinine, Ser 1.09 (H) 0.61 - 1.24 mg/dL   Calcium 9.4 8.9 - 32.3 mg/dL   Total Protein 8.0 6.5 - 8.1 g/dL   Albumin 4.8 3.5 - 5.0 g/dL   AST 35 15 - 41 U/L   ALT 15 0 - 44 U/L   Alkaline Phosphatase 55 38 - 126 U/L   Total Bilirubin 1.9 (H) 0.3 - 1.2 mg/dL   GFR, Estimated 58 (L) >60 mL/min    Comment: (NOTE) Calculated using the CKD-EPI Creatinine Equation (2021)    Anion gap 13 5 - 15    Comment: Performed at [facility], [address] [place], [place] [address]  Ethanol     Status: None   Collection Time: 10/17/20  6:11 AM  Result Value Ref Range   Alcohol, Ethyl (B) <10 <10 mg/dL    Comment: (NOTE) Lowest detectable limit for serum alcohol is 10 mg/dL.  For medical purposes only. Performed at [facility], [address] [place], [place] [address]   Salicylate level     Status: Abnormal   Collection Time: 10/17/20  6:11 AM  Result Value Ref Range   Salicylate Lvl <7.0 (L) 7.0 - 30.0 mg/dL    Comment: Performed at [facility], [address] [place], [place] [address]  Acetaminophen level     Status: Abnormal   Collection Time: 10/17/20  6:11 AM  Result Value Ref Range   Acetaminophen (Tylenol), Serum <10 (L) 10 - 30 ug/mL    Comment: (NOTE) Therapeutic concentrations vary significantly. A range of 10-30 ug/mL  may be an effective concentration for many patients. However, some  are best treated at concentrations outside of this range. Acetaminophen concentrations >150 ug/mL at 4 hours after ingestion  and >50 ug/mL at 12 hours after ingestion are often associated with  toxic reactions.  Performed at [facility], [address] [place], [place] [address]   CBC with Differential     Status: None   Collection Time: 10/17/20  6:11 AM  Result Value Ref Range   WBC 6.0 4.0 - 10.5 K/uL   RBC 4.70 4.22 - 5.81 MIL/uL   Hemoglobin  14.8 13.0 - 17.0 g/dL   HCT 31.5 17.6 - 16.0 %   MCV 93.4 80.0 - 100.0 fL   MCH 31.5 26.0 - 34.0 pg   MCHC 33.7 30.0 - 36.0 g/dL   RDW 73.7 10.6 - 26.9 %   Platelets 257 150 - 400 K/uL   nRBC 0.0 0.0 - 0.2 %   Neutrophils Relative % 38 %   Neutro Abs 2.3 1.7 - 7.7 K/uL   Lymphocytes Relative 48 %   Lymphs Abs 2.9 0.7 - 4.0 K/uL   Monocytes Relative 12 %   Monocytes  Absolute 0.7 0.1 - 1.0 K/uL   Eosinophils Relative 1 %   Eosinophils Absolute 0.1 0.0 - 0.5 K/uL   Basophils Relative 1 %   Basophils Absolute 0.0 0.0 - 0.1 K/uL   Immature Granulocytes 0 %   Abs Immature Granulocytes 0.01 0.00 - 0.07 K/uL    Comment: Performed at [facility], [address], [place], [place] [address]    Current Facility-Administered Medications  Medication Dose Route Frequency Provider Last Rate Last Admin  . [START ON 10/18/2020] lurasidone (LATUDA) tablet 20 mg  20 mg Oral Q breakfast Breyton Vanscyoc T, MD      . OLANZapine zydis (ZYPREXA) disintegrating tablet 20 mg  20 mg Oral QHS Reginald Weida T, MD      . potassium chloride SA (KLOR-CON) CR tablet 40 mEq  40 mEq Oral Once Irean Hong, MD       Current Outpatient Medications  Medication Sig Dispense Refill  . lurasidone (LATUDA) 20 MG TABS tablet Take 1 tablet (20 mg total) by mouth daily with supper. (Patient not taking: Reported on 10/17/2020) 30 tablet 1  . OLANZapine (ZYPREXA) 20 MG tablet Take 1 tablet (20 mg total) by mouth at bedtime. (Patient not taking: Reported on 10/17/2020) 30 tablet 1    Musculoskeletal: Strength & Muscle Tone: within normal limits Gait & Station: normal Patient leans: N/A            Psychiatric Specialty Exam:  Presentation  General Appearance: No data recorded Eye Contact:No data recorded Speech:No data recorded Speech Volume:No data recorded Handedness:No data recorded  Mood and Affect  Mood:No data recorded Affect:No data recorded  Thought Process  Thought Processes:No data  recorded Descriptions of Associations:No data recorded Orientation:No data recorded Thought Content:No data recorded History of Schizophrenia/Schizoaffective disorder:Yes  Duration of Psychotic Symptoms:Greater than six months  Hallucinations:No data recorded Ideas of Reference:No data recorded Suicidal Thoughts:No data recorded Homicidal Thoughts:No data recorded  Sensorium  Memory:No data recorded Judgment:No data recorded Insight:No data recorded  Executive Functions  Concentration:No data recorded Attention Span:No data recorded Recall:No data recorded Fund of Knowledge:No data recorded Language:No data recorded  Psychomotor Activity  Psychomotor Activity:No data recorded  Assets  Assets:No data recorded  Sleep  Sleep:No data recorded  Physical Exam: Physical Exam Vitals and nursing note reviewed.  Constitutional:      Appearance: Normal appearance.  HENT:     Head: Normocephalic and atraumatic.     Mouth/Throat:     Pharynx: Oropharynx is clear.  Eyes:     Pupils: Pupils are equal, round, and reactive to light.  Cardiovascular:     Rate and Rhythm: Normal rate and regular rhythm.  Pulmonary:     Effort: Pulmonary effort is normal.     Breath sounds: Normal breath sounds.  Abdominal:     General: Abdomen is flat.     Palpations: Abdomen is soft.  Musculoskeletal:        General: Normal range of motion.  Skin:    General: Skin is warm and dry.  Neurological:     General: No focal deficit present.     Mental Status: He is alert. Mental status is at baseline.  Psychiatric:        Attention and Perception: He is inattentive.        Mood and Affect: Affect is labile.        Speech: Speech is tangential.        Behavior: Behavior is agitated. Behavior is not aggressive.  Thought Content: Thought content is paranoid. Thought content does not include homicidal or suicidal ideation.        Cognition and Memory: Cognition is impaired. Memory is  impaired.        Judgment: Judgment is inappropriate.    Review of Systems  Constitutional: Negative.   HENT: Negative.   Eyes: Negative.   Respiratory: Negative.   Cardiovascular: Negative.   Gastrointestinal: Negative.   Musculoskeletal: Negative.   Skin: Negative.   Neurological: Negative.   Psychiatric/Behavioral: Negative.    Blood pressure (!) 148/84, pulse 98, temperature 97.6 F (36.4 C), temperature source Oral, resp. rate 20, SpO2 100 %. There is no height or weight on file to calculate BMI.  Treatment Plan Summary: Medication management and Plan Patient well-known to [place].  He lives independently and was just discharged from the hospital the third week of February.  From what I am being told so far his parents are already calling advocating for a longer term hospitalization.  Patient has been difficult to treat because of his consistently odd and dramatic behavior when he is psychotic and the fact that he lives independently.  Last time he was in the hospital it was a very extended hospitalization as well as the one before that.  I have restarted the olanzapine and Latuda that he was prescribed last time he was here.  We will monitor labs monitor vitals and plan for admission when bed availability allows with referral in the meantime.  Disposition: Recommend psychiatric Inpatient admission when medically cleared. Supportive therapy provided about ongoing stressors.  Mordecai Rasmussen, MD 10/17/2020 2:13 PM

## 2020-10-17 NOTE — ED Notes (Signed)
Pt is laying in bed with eyes open.  Pt is still disorganized and unable to have a conversation.

## 2020-10-17 NOTE — ED Notes (Signed)
Pt given snack and sprite and warm blankets.  Pt lying comfortably in bed, denies other needs at this time.

## 2020-10-17 NOTE — ED Notes (Signed)
Pt naked in his room, jumping, twirling and not speaking.  IM mediation given as ordered. Pt took medication willingly.

## 2020-10-17 NOTE — ED Triage Notes (Signed)
Pt arrived via EMS and [employer] after being found without clothing walking down the middle of [place] road. Pt not speaking or communicating with words but will nod and gesture to answer questions. Pt with involuntary movements. Pt unable/unwilling to consent to safety or answer triage SI and HI questions as well as hallucinations or delusions. Pt unwilling to stay inside of room and unaware of his boundaries or limitations. Poor judgement recognized with movements as well as gestures. Legal guardian contacted and reports Dr. Toni Amend has been working on getting pt into a long term psych facility and since discharge pt has been staying in an apartment in [place] off [address] by self. Per legal guardian, pt will not take medication unless forced and is not able to care for self. Legal guardian reports pt was at group home but the owner passed away last year.     Legal guardian information and contact used: Arts development officer  [phone]) Frederik Schmidt  [phone])

## 2020-10-17 NOTE — ED Notes (Signed)
Pt arrived on stretcher with forensic hand restraint. While right arm restrained by [employer] officer, this writer able to obtain blood as well as vital signs. Restraints removed and pt provided with hospital scrubs, socks and brief. No belongings found on or with pt to store/lock up.

## 2020-10-17 NOTE — ED Provider Notes (Signed)
[facility] Emergency Department Provider Note   ____________________________________________   Event Date/Time   First MD Initiated Contact with Patient 10/17/20 [phone]     (approximate)  I have reviewed the triage vital signs and the nursing notes.   HISTORY  Chief Complaint Mental Health Problem  Level V caveat: Vague historian  HPI Jackson [name] is a 35 y.o. male brought to the ED via EMS and BPD under IVC.  Patient with a history of bipolar and schizoaffective disorder who is noncompliant with his medications.  He was found walking down [address] naked.  Arrives to the ED minimally cooperative, avoiding questions, slightly agitated.     Past Medical History:  Diagnosis Date  . Bipolar 1 disorder (HCC)   . Schizoaffective disorder [facility])     Patient Active Problem List   Diagnosis Date Noted  . Noncompliance 10/18/2018  . Schizoaffective disorder, bipolar type (HCC) 10/17/2018    History reviewed. No pertinent surgical history.  Prior to Admission medications   Medication Sig Start Date End Date Taking? Authorizing Provider  lurasidone (LATUDA) 20 MG TABS tablet Take 1 tablet (20 mg total) by mouth daily with supper. Patient not taking: Reported on 10/17/2020 09/28/20   [name], MD  OLANZapine (ZYPREXA) 20 MG tablet Take 1 tablet (20 mg total) by mouth at bedtime. Patient not taking: Reported on 10/17/2020 09/28/20   [name], MD    Allergies Patient has no known allergies.  History reviewed. No pertinent family history.  Social History Social History   Tobacco Use  . Smoking status: Light Tobacco Smoker    Packs/day: 0.25    Types: Cigarettes  . Smokeless tobacco: Never Used  Vaping Use  . Vaping Use: Never used  Substance Use Topics  . Alcohol use: Not Currently  . Drug use: Not Currently    Review of Systems  Constitutional: No fever/chills Eyes: No visual changes. ENT: No sore  throat. Cardiovascular: Denies chest pain. Respiratory: Denies shortness of breath. Gastrointestinal: No abdominal pain.  No nausea, no vomiting.  No diarrhea.  No constipation. Genitourinary: Negative for dysuria. Musculoskeletal: Negative for back pain. Skin: Negative for rash. Neurological: Negative for headaches, focal weakness or numbness. Psychiatric:  Positive for psychosis.  ____________________________________________   PHYSICAL EXAM:  VITAL SIGNS: ED Triage Vitals [10/17/20 0609]  Enc Vitals Group     BP (!) 148/84     Pulse Rate 98     Resp 18     Temp 97.6 F (36.4 C)     Temp Source Oral     SpO2 100 %     Weight      Height      Head Circumference      Peak Flow      Pain Score      Pain Loc      Pain Edu?      Excl. in GC?     Constitutional: Alert and oriented.  Disheveled and naked appearing and in no acute distress. Eyes: Conjunctivae are normal. PERRL. EOMI. Head: Atraumatic. Nose: No congestion/rhinnorhea. Mouth/Throat: Mucous membranes are moist.   Neck: No stridor.   Cardiovascular: Normal rate, regular rhythm. Grossly normal heart sounds.  Good peripheral circulation. Respiratory: Normal respiratory effort.  No retractions. Lungs CTAB. Gastrointestinal: Soft and nontender. No distention. No abdominal bruits. No CVA tenderness. Musculoskeletal: No lower extremity tenderness nor edema.  No joint effusions. Neurologic:  Normal speech and language. No gross focal neurologic deficits are  appreciated. No gait instability. Skin:  Skin is warm, dry and intact. No rash noted. Psychiatric: Mood and affect are flat, avoids questioning.  Limited speech due to uncooperative nature, bizarre behavior ____________________________________________   LABS (all labs ordered are listed, but only abnormal results are displayed)  Labs Reviewed  COMPREHENSIVE METABOLIC PANEL - Abnormal; Notable for the following components:      Result Value   Potassium 3.1 (*)     Glucose, Bld 118 (*)    Creatinine, Ser 1.58 (*)    Total Bilirubin 1.9 (*)    GFR, Estimated 58 (*)    All other components within normal limits  SALICYLATE LEVEL - Abnormal; Notable for the following components:   Salicylate Lvl <7.0 (*)    All other components within normal limits  ACETAMINOPHEN LEVEL - Abnormal; Notable for the following components:   Acetaminophen (Tylenol), Serum <10 (*)    All other components within normal limits  ETHANOL  CBC WITH DIFFERENTIAL/PLATELET  URINE DRUG SCREEN, QUALITATIVE ([facility] ONLY)   ____________________________________________  EKG  None ____________________________________________  RADIOLOGY I, Matan Steen J, personally viewed and evaluated these images (plain radiographs) as part of my medical decision making, as well as reviewing the written report by the radiologist.  ED MD interpretation: None  Official radiology report(s): No results found.  ____________________________________________   PROCEDURES  Procedure(s) performed (including Critical Care):  Procedures   ____________________________________________   INITIAL IMPRESSION / ASSESSMENT AND PLAN / ED COURSE  As part of my medical decision making, I reviewed the following data within the electronic MEDICAL RECORD NUMBER Nursing notes reviewed and incorporated, Labs reviewed, EKG interpreted, Old chart reviewed, A consult was requested and obtained from this/these consultant(s) Psychiatry and Notes from prior ED visits     35 year old male with bipolar and schizoaffective disorder found walking naked down the street in the middle of the night in cold weather. Will maintain IVC pending psychiatric evaluation.  Clinical Course as of 10/17/20 0722  Wed Oct 17, 2020  0642 Noted mild hypokalemia; stable bilirubin from prior. [JS]  [id number] Patient twirling, took off his clothes, stomping on the floor.  Requires additional IM calming agent. [JS]    Clinical Course User  Index [JS] Irean Hong, MD     ____________________________________________   FINAL CLINICAL IMPRESSION(S) / ED DIAGNOSES  Final diagnoses:  Bipolar disorder, current episode mixed, moderate (HCC)  Psychosis, unspecified psychosis type (HCC)  Hypokalemia     ED Discharge Orders    None      *Please note:  Aldwin Gasbarro was evaluated in Emergency Department on 10/17/2020 for the symptoms described in the history of present illness. He was evaluated in the context of the global COVID-19 pandemic, which necessitated consideration that the patient might be at risk for infection with the SARS-CoV-2 virus that causes COVID-19. Institutional protocols and algorithms that pertain to the evaluation of patients at risk for COVID-19 are in a state of rapid change based on information released by regulatory bodies including the CDC and federal and state organizations. These policies and algorithms were followed during the patient's care in the ED.  Some ED evaluations and interventions may be delayed as a result of limited staffing during and the pandemic.*   Note:  This document was prepared using Dragon voice recognition software and may include unintentional dictation errors.   Irean Hong, MD 10/17/20 [phone]

## 2020-10-17 NOTE — ED Notes (Addendum)
Pt parent's called requesting update which this RN provided and then requesting to speak to pt.  Pt given phone speaking with parents.

## 2020-10-17 NOTE — ED Notes (Signed)
IVC/Pending Placement 

## 2020-10-17 NOTE — ED Notes (Signed)
Patient refused to give urine ,will ask again at a later time.

## 2020-10-17 NOTE — ED Notes (Signed)
Pt continuing to spin around in circles in room and jumping up and down. Pt coming out into hallway and being directed back to room. Pt had meal tray and pt threw sandwich in floor. Sandwich thrown in trash and pt still with chips and crackers.

## 2020-10-17 NOTE — ED Notes (Signed)
Pt is refusing VS at this time.  

## 2020-10-17 NOTE — ED Notes (Signed)
Pt continues to jump and bang into the walls and garage door.

## 2020-10-17 NOTE — BH Assessment (Signed)
Comprehensive Clinical Assessment (CCA) Note  10/17/2020 Jackson [name] [id number]  Chief Complaint:  Chief Complaint  Patient presents with  . Mental Health Problem   Visit Diagnosis: Schizoaffective Disorder, Bipolar Type    CCA Screening, Triage and Referral (STR)  Patient Reported Information How did you hear about us? Other (Comment) Mudlogger(Law Enforcement)  Referral name: Patent examiner[employer] Enforcement  Referral phone number: No data recorded  Whom do you see for routine medical problems? Other (Comment)  Practice/Facility Name: No data recorded Practice/Facility Phone Number: No data recorded Name of Contact: No data recorded Contact Number: No data recorded Contact Fax Number: No data recorded Prescriber Name: No data recorded Prescriber Address (if known): No data recorded  What Is the Reason for Your Visit/Call Today? Walking in the street with no clothes on. Patient currently psychotic  How Long Has This Been Causing You Problems? > than 6 months  What Do You Feel Would Help You the Most Today? Medication(s)   Have You Recently Been in Any Inpatient Treatment (Hospital/Detox/Crisis Center/28-Day Program)? Yes  Name/Location of Program/Hospital:[facility]  How Long Were You There? 11 days  When Were You Discharged? 09/28/2020   [facility] You [facility] Before? Yes  [facility]? Medical and Mental Health Treatment   Have You Recently Had Any Thoughts About Hurting Yourself? No  Are You Planning to Commit Suicide/Harm Yourself At This time? No   Have you Recently Had Thoughts About Hurting Someone Karolee Ohslse? No  Explanation: No data recorded  Have You Used Any Alcohol or Drugs in the Past 24 Hours? No  How Long Ago Did You Use Drugs or Alcohol? No data recorded What Did You Use and How Much? No data recorded  Do You Currently Have a Therapist/Psychiatrist? Yes  Name of Therapist/Psychiatrist: [facility]   Have You Been  Recently Discharged From Any Office Practice or Programs? No  Explanation of Discharge From Practice/Program: No data recorded    CCA Screening Triage Referral Assessment Type of Contact: Face-to-Face  Is this Initial or Reassessment? No data recorded Date Telepsych consult ordered in [facility]:  04/11/2020  Time [facility]psych [facility]  1444   Patient Reported Information Reviewed? Yes  Patient Left Without Being Seen? No data recorded Reason for Not Completing Assessment: No data recorded  Collateral Involvement: Crystal Gilliam-Legal Guardian   Does Patient Have a Automotive engineerCourt Appointed Legal Guardian? No data recorded Name and Contact of Legal Guardian: Crystal Loughry [phone]  If Minor and Not Living with Parent(s), Who has Custody? n/a  Is CPS involved or ever been involved? Never  Is APS involved or ever been involved? Never   Patient Determined To Be At Risk for Harm To Self or Others Based on Review of Patient Reported Information or Presenting Complaint? No  Method: No data recorded Availability of Means: No data recorded Intent: No data recorded Notification Required: No data recorded Additional Information for Danger to Others Potential: No data recorded Additional Comments for Danger to Others Potential: No data recorded Are There Guns or Other Weapons in Your Home? No data recorded Types of Guns/Weapons: No data recorded Are These Weapons Safely Secured?                            No data recorded Who Could Verify You Are Able To Have These Secured: No data recorded Do You Have any Outstanding Charges, Pending Court Dates, Parole/Probation?  No data recorded Contacted To Inform of Risk of Harm To Self or Others: No data recorded  Location of Assessment: [facility] ED   Does Patient Present under Involuntary Commitment? Yes  IVC Papers Initial File Date: 10/16/2020   [place] of Residence: [place]   Patient Currently Receiving the Following Services:  ACTT Psychologist, educational)   Determination of Need: Emergent (2 hours)   Options For Referral: Medication Management     CCA Biopsychosocial Intake/Chief Complaint:  Walking in the street with no clothes on. Patient currently psychotic  Current Symptoms/Problems: Currently psychotic   Patient Reported Schizophrenia/Schizoaffective Diagnosis in Past: Yes   Strengths: Have some insight into his problems  Preferences: Desires to go home  Abilities: Able to communicate for his self   Type of Services Patient Feels are Needed: ACTT   Initial Clinical Notes/Concerns: Not at his baseline   Mental Health Symptoms Depression:  None   Duration of Depressive symptoms: No data recorded  Mania:  Change in energy/activity; Increased Energy; Overconfidence   Anxiety:   None   Psychosis:  Hallucinations; Grossly disorganized speech   Duration of Psychotic symptoms: Greater than six months   Trauma:  None   Obsessions:  None   Compulsions:  Disrupts with routine/functioning   Inattention:  Poor follow-through on tasks   Hyperactivity/Impulsivity:  Always on the go; Blurts out answers; Difficulty waiting turn; Feeling of restlessness; Fidgets with hands/feet   Oppositional/Defiant Behaviors:  None   Emotional Irregularity:  None   Other Mood/Personality Symptoms:  None noted    Mental Status Exam Appearance and self-care  Stature:  Average   Weight:  Average weight   Clothing:  Age-appropriate   Grooming:  Normal   Cosmetic use:  None   Posture/gait:  Normal   Motor activity:  -- (Within normal range)   Sensorium  Attention:  Normal   Concentration:  Normal   Orientation:  X5   Recall/memory:  Normal   Affect and Mood  Affect:  Appropriate; Full Range   Mood:  Anxious   Relating  Eye contact:  Normal   Facial expression:  Constricted   Attitude toward examiner:  Cooperative   Thought and Language  Speech flow: Blocked; Clear  and Coherent   Thought content:  Appropriate to Mood and Circumstances   Preoccupation:  None   Hallucinations:  Visual   Organization:  No data recorded  [employer] of Knowledge:  Fair   Intelligence:  Average   Abstraction:  Functional   Judgement:  Impaired   Reality Testing:  Distorted   Insight:  Poor   Decision Making:  Confused   Social Functioning  Social Maturity:  Self-centered   Social Judgement:  Impropriety; "Street Smart"   Stress  Stressors:  Housing; Grief/losses; Family conflict   Coping Ability:  Deficient supports   Skill Deficits:  None   Supports:  Family     Religion: Religion/Spirituality Are You A Religious Person?: No  Leisure/Recreation: Leisure / Recreation Do You Have Hobbies?: No  Exercise/Diet: Exercise/Diet Do You Exercise?: No Have You Gained or Lost A Significant Amount of Weight in the Past Six Months?: No Do You Follow a Special Diet?: No Do You Have Any Trouble Sleeping?: No   CCA Employment/Education Employment/Work Situation: Employment / Work Situation Employment situation: On disability Why is patient on disability: Schizophrenia How long has patient been on disability: Several years Patient's job has been impacted by current illness:  (n/a) What is the longest  time patient has a held a job?: n/a Where was the patient employed at that time?: n/a Has patient ever been in the [employer]?: No  Education: Education Is Patient Currently Attending School?: No Last Grade Completed:  (Unable to assess) Name of High School: Unable to assess Did You Graduate From [employer]?:  (Unable to assess) Did You Attend Graduate School?: No Did You Have Any Special Interests In School?: Unable to assess Did You Have An Individualized Education Program (IIEP):  (Unable to assess) Did You Have Any Difficulty At School?:  (Unable to assess) Patient's Education Has Been Impacted by Current Illness:  (Unable to  assess)   CCA Family/Childhood History Family and Relationship History: Family history Marital status: Single Are you sexually active?:  (Unknown) What is your sexual orientation?: Unknown Has your sexual activity been affected by drugs, alcohol, medication, or emotional stress?: none reported Does patient have children?: No  Childhood History:  Childhood History By whom was/is the patient raised?: Both parents Additional childhood history information: Doesn't report of any problems. Description of patient's relationship with caregiver when they were a child: Doesn't report of any problems. Patient's description of current relationship with people who raised him/her: Doesn't report of any problems. How were you disciplined when you got in trouble as a child/adolescent?: Doesn't report of any problems. Does patient have siblings?: Yes Number of Siblings: 1 Description of patient's current relationship with siblings: Unable to assess Did patient suffer any verbal/emotional/physical/sexual abuse as a child?: No Did patient suffer from severe childhood neglect?: No Has patient ever been sexually abused/assaulted/raped as an adolescent or adult?: No Was the patient ever a victim of a crime or a disaster?: No Witnessed domestic violence?: No Has patient been affected by domestic violence as an adult?: No  Child/Adolescent Assessment:     CCA Substance Use Alcohol/Drug Use: Alcohol / Drug Use Pain Medications: See PTA Prescriptions: See PTA Over the Counter: See PTA History of alcohol / drug use?: No history of alcohol / drug abuse Longest period of sobriety (when/how long): n/a                         ASAM's:  Six Dimensions of Multidimensional Assessment  Dimension 1:  Acute Intoxication and/or Withdrawal Potential:      Dimension 2:  Biomedical Conditions and Complications:      Dimension 3:  Emotional, Behavioral, or Cognitive Conditions and Complications:      Dimension 4:  Readiness to Change:     Dimension 5:  Relapse, Continued use, or Continued Problem Potential:     Dimension 6:  Recovery/Living Environment:     ASAM Severity Score:    ASAM Recommended Level of Treatment:     Substance use Disorder (SUD)    Recommendations for Services/Supports/Treatments:    DSM5 Diagnoses: Patient Active Problem List   Diagnosis Date Noted  . Noncompliance 10/18/2018  . Schizoaffective disorder, bipolar type (HCC) 10/17/2018    Patient Centered Plan: Patient is on the following Treatment Plan(s):  Schizoaffective Disorder, Bipolar Type   Referrals to Alternative Service(s): Referred to Alternative Service(s):   Place:   Date:   Time:    Referred to Alternative Service(s):   Place:   Date:   Time:    Referred to Alternative Service(s):   Place:   Date:   Time:    Referred to Alternative Service(s):   Place:   Date:   Time:     Roetta Horsfall.  Kathrynn Running MS, LCAS, [facility], [facility] Therapeutic Triage Specialist 10/17/2020 5:39 PM

## 2020-10-18 NOTE — ED Notes (Signed)
Patient refused v-signs at this time. °

## 2020-10-18 NOTE — ED Notes (Signed)
Pt refusing VS and nightly meds at this time. Pt states "You just took my vitals" and "Take the medicine? For what? No" and pt repeatedly shook his head at this time. Will attempt VS and med administration at later time.

## 2020-10-18 NOTE — ED Notes (Signed)
Pt given meal tray at this time 

## 2020-10-18 NOTE — ED Notes (Signed)
IVC pending placement 

## 2020-10-18 NOTE — ED Notes (Signed)
Pt refusing vitals.

## 2020-10-18 NOTE — ED Provider Notes (Signed)
Emergency Medicine Observation Re-evaluation Note  Jackson [name] is a 35 y.o. male, seen on rounds today.  Pt initially presented to the ED for complaints of Mental Health Problem Currently, the patient is resting comfortably.  Physical Exam  BP 131/80 (BP Location: Right Arm)   Pulse 80   Temp 98.2 F (36.8 C) (Oral)   Resp 16   SpO2 99%  Physical Exam Gen: No acute distress  Resp: Normal rise and fall of chest Neuro: Moving all four extremities Psych: Resting currently, calm and cooperative when awake    ED Course / MDM  EKG:   I have reviewed the labs performed to date as well as medications administered while in observation.  Recent changes in the last 24 hours include no acute events overnight.  Plan  Current plan is for inpatient psychiatric treatment. Patient is under full IVC at this time.   Jackson [name], Jackson Maw, DO 10/18/20 [phone]

## 2020-10-18 NOTE — ED Notes (Signed)
Pt given phone to speak with mom/legal guardian at this time.

## 2020-10-18 NOTE — Consult Note (Signed)
[name] Face-to-Face Psychiatry Consult   Reason for Consult: Follow-up patient with schizoaffective disorder in the emergency room Referring Physician: [name] Patient Identification: [name] MRN:  [id number] Principal Diagnosis: Schizoaffective disorder, bipolar type (HCC) Diagnosis:  Principal Problem:   Schizoaffective disorder, bipolar type (HCC)   Total Time spent with patient: 30 minutes  Subjective:   [name] is a 35 y.o. male patient admitted with "can I go home?".  HPI: Patient seen for follow-up.  He has not had any documented behavior problems overnight.  He is calm and not raising his voice not agitated.  He took his olanzapine last night but refused the [place] today.  On interview he has no new complaints but requests to be discharged.  Rather quiet and withdrawn.  No evidence of suicidality.  Past Psychiatric History: History of schizoaffective disorder several prior admissions tends to get himself in trouble with odd and disruptive behavior outside the [name]  Risk to Self:   Risk to Others:   Prior Inpatient Therapy:   Prior Outpatient Therapy:    Past Medical History:  Past Medical History:  Diagnosis Date  . Bipolar 1 disorder (HCC)   . Schizoaffective disorder (HCC)    History reviewed. No pertinent surgical history. Family History: History reviewed. No pertinent family history. Family Psychiatric  History: See previous Social History:  Social History   Substance and Sexual Activity  Alcohol Use Not Currently     Social History   Substance and Sexual Activity  Drug Use Not Currently    Social History   Socioeconomic History  . Marital status: Single    Spouse name: Not on file  . Number of children: Not on file  . Years of education: Not on file  . Highest education level: Not on file  Occupational History  . Not on file  Tobacco Use  . Smoking status: Light Tobacco Smoker    Packs/day: 0.25    Types: Cigarettes  . Smokeless  tobacco: Never Used  Vaping Use  . Vaping Use: Never used  Substance and Sexual Activity  . Alcohol use: Not Currently  . Drug use: Not Currently  . Sexual activity: Not on file  Other Topics Concern  . Not on file  Social History Narrative  . Not on file   Social Determinants of Health   Financial Resource Strain: Not on file  Food Insecurity: Not on file  Transportation Needs: Not on file  Physical Activity: Not on file  Stress: Not on file  Social Connections: Not on file   Additional Social History:    Allergies:  No Known Allergies  Labs:  Results for orders placed or performed during the [name] encounter of 10/17/20 (from the past 48 hour(s))  Comprehensive metabolic panel     Status: Abnormal   Collection Time: 10/17/20  6:11 AM  Result Value Ref Range   Sodium 139 135 - 145 mmol/L   Potassium 3.1 (L) 3.5 - 5.1 mmol/L   Chloride 104 98 - 111 mmol/L   CO2 22 22 - 32 mmol/L   Glucose, Bld 118 (H) 70 - 99 mg/dL    Comment: Glucose reference range applies only to samples taken after fasting for at least 8 hours.   BUN 17 6 - 20 mg/dL   Creatinine, Ser 2.77 (H) 0.61 - 1.24 mg/dL   Calcium 9.4 8.9 - 82.4 mg/dL   Total Protein 8.0 6.5 - 8.1 g/dL   Albumin 4.8 3.5 - 5.0 g/dL   AST 35  15 - 41 U/L   ALT 15 0 - 44 U/L   Alkaline Phosphatase 55 38 - 126 U/L   Total Bilirubin 1.9 (H) 0.3 - 1.2 mg/dL   GFR, Estimated 58 (L) >60 mL/min    Comment: (NOTE) Calculated using the CKD-EPI Creatinine Equation (2021)    Anion gap 13 5 - 15    Comment: Performed at [name], [address] [place], [place] [address]  Ethanol     Status: None   Collection Time: 10/17/20  6:11 AM  Result Value Ref Range   Alcohol, Ethyl (B) <10 <10 mg/dL    Comment: (NOTE) Lowest detectable limit for serum alcohol is 10 mg/dL.  For medical purposes only. Performed at [name], [address] [place], [place] [address]   Salicylate level     Status: Abnormal    Collection Time: 10/17/20  6:11 AM  Result Value Ref Range   Salicylate Lvl <7.0 (L) 7.0 - 30.0 mg/dL    Comment: Performed at [name], [address] [place], [place] [address]  Acetaminophen level     Status: Abnormal   Collection Time: 10/17/20  6:11 AM  Result Value Ref Range   Acetaminophen (Tylenol), Serum <10 (L) 10 - 30 ug/mL    Comment: (NOTE) Therapeutic concentrations vary significantly. A range of 10-30 ug/mL  may be an effective concentration for many patients. However, some  are best treated at concentrations outside of this range. Acetaminophen concentrations >150 ug/mL at 4 hours after ingestion  and >50 ug/mL at 12 hours after ingestion are often associated with  toxic reactions.  Performed at [name], [address] [place], [place] [address]   CBC with Differential     Status: None   Collection Time: 10/17/20  6:11 AM  Result Value Ref Range   WBC 6.0 4.0 - 10.5 K/uL   RBC 4.70 4.22 - 5.81 MIL/uL   Hemoglobin 14.8 13.0 - 17.0 g/dL   HCT 64.3 32.9 - 51.8 %   MCV 93.4 80.0 - 100.0 fL   MCH 31.5 26.0 - 34.0 pg   MCHC 33.7 30.0 - 36.0 g/dL   RDW 84.1 66.0 - 63.0 %   Platelets 257 150 - 400 K/uL   nRBC 0.0 0.0 - 0.2 %   Neutrophils Relative % 38 %   Neutro Abs 2.3 1.7 - 7.7 K/uL   Lymphocytes Relative 48 %   Lymphs Abs 2.9 0.7 - 4.0 K/uL   Monocytes Relative 12 %   Monocytes Absolute 0.7 0.1 - 1.0 K/uL   Eosinophils Relative 1 %   Eosinophils Absolute 0.1 0.0 - 0.5 K/uL   Basophils Relative 1 %   Basophils Absolute 0.0 0.0 - 0.1 K/uL   Immature Granulocytes 0 %   Abs Immature Granulocytes 0.01 0.00 - 0.07 K/uL    Comment: Performed at [name] [address] [place], [place] [address]    Current Facility-Administered Medications  Medication Dose Route Frequency Provider Last Rate Last Admin  . lurasidone (LATUDA) tablet 20 mg  20 mg Oral Q breakfast Sabrinia Prien T, MD      . OLANZapine zydis (ZYPREXA)  disintegrating tablet 20 mg  20 mg Oral QHS Lafreda Casebeer, [name], MD   20 mg at 10/17/20 2247  . potassium chloride SA (KLOR-CON) CR tablet 40 mEq  40 mEq Oral Once Irean Hong, MD       Current Outpatient Medications  Medication Sig Dispense Refill  .  lurasidone (LATUDA) 20 MG TABS tablet Take 1 tablet (20 mg total) by mouth daily with supper. (Patient not taking: Reported on 10/17/2020) 30 tablet 1  . OLANZapine (ZYPREXA) 20 MG tablet Take 1 tablet (20 mg total) by mouth at bedtime. (Patient not taking: Reported on 10/17/2020) 30 tablet 1    Musculoskeletal: Strength & Muscle Tone: within normal limits Gait & Station: normal Patient leans: N/A            Psychiatric Specialty Exam:  Presentation  General Appearance: No data recorded Eye Contact:No data recorded Speech:No data recorded Speech Volume:No data recorded Handedness:No data recorded  Mood and Affect  Mood:No data recorded Affect:No data recorded  Thought Process  Thought Processes:No data recorded Descriptions of Associations:No data recorded Orientation:No data recorded Thought Content:No data recorded History of Schizophrenia/Schizoaffective disorder:Yes  Duration of Psychotic Symptoms:Greater than six months  Hallucinations:No data recorded Ideas of Reference:No data recorded Suicidal Thoughts:No data recorded Homicidal Thoughts:No data recorded  Sensorium  Memory:No data recorded Judgment:No data recorded Insight:No data recorded  Executive Functions  Concentration:No data recorded Attention Span:No data recorded Recall:No data recorded Fund of Knowledge:No data recorded Language:No data recorded  Psychomotor Activity  Psychomotor Activity:No data recorded  Assets  Assets:No data recorded  Sleep  Sleep:No data recorded  Physical Exam: Physical Exam Constitutional:      Appearance: Normal appearance.  HENT:     Head: Normocephalic and atraumatic.     Mouth/Throat:     Pharynx:  Oropharynx is clear.  Eyes:     Pupils: Pupils are equal, round, and reactive to light.  Cardiovascular:     Rate and Rhythm: Normal rate and regular rhythm.  Pulmonary:     Effort: Pulmonary effort is normal.     Breath sounds: Normal breath sounds.  Abdominal:     General: Abdomen is flat.     Palpations: Abdomen is soft.  Musculoskeletal:        General: Normal range of motion.  Skin:    General: Skin is warm and dry.  Neurological:     General: No focal deficit present.     Mental Status: He is alert. Mental status is at baseline.  Psychiatric:        Attention and Perception: Attention normal.        Mood and Affect: Mood normal. Affect is blunt.        Speech: Speech is delayed.        Behavior: Behavior is slowed.        Thought Content: Thought content normal. Thought content does not include homicidal or suicidal ideation.        Cognition and Memory: Cognition normal.        Judgment: Judgment is impulsive.    Review of Systems  Constitutional: Negative.   HENT: Negative.   Eyes: Negative.   Respiratory: Negative.   Cardiovascular: Negative.   Gastrointestinal: Negative.   Musculoskeletal: Negative.   Skin: Negative.   Neurological: Negative.   Psychiatric/Behavioral: Negative.    Blood pressure 131/80, pulse 80, temperature 98.2 F (36.8 C), temperature source Oral, resp. rate 16, SpO2 99 %. There is no height or weight on file to calculate BMI.  Treatment Plan Summary: Plan Patient continues to be appropriate for inpatient hospitalization because of his history of recurrent psychosis, his recent bizarre behavior and poor self-care and his history of medicine noncompliance.  I have proposed admission to the inpatient unit here and will discuss this with the team.  He  might or might not be appropriate given his past history and their current level of agitation downstairs.  Meanwhile continue current medicine which is what he was on when he was discharged last.   No new orders at this time.  We have also referred him out to multiple other psychiatric facilities  Disposition: Recommend psychiatric Inpatient admission when medically cleared. Discussed crisis plan, support from social network, calling 911, coming to the Emergency Department, and calling Suicide Hotline.  Mordecai Rasmussen, MD 10/18/2020 11:19 AM

## 2020-10-18 NOTE — ED Notes (Signed)
Pt refusing VS at this time. Pt states, "come back later and we'll see, but I just don't want to".

## 2020-10-18 NOTE — ED Notes (Signed)
Pt given cup of water by EDT Mayra

## 2020-10-18 NOTE — ED Notes (Signed)
Pt. Refused to get vital signs done, by this tech and  RN Jaclynn Guarneri was notified.

## 2020-10-19 LAB — POC SARS CORONAVIRUS 2 AG -  ED: SARS Coronavirus 2 Ag: NEGATIVE

## 2020-10-19 NOTE — Consult Note (Signed)
[facility] Face-to-Face Psychiatry Consult   Reason for Consult: Follow-up consult 35 year old man in the emergency room with schizoaffective disorder Referring Physician: Scotty Court Patient Identification: [name] MRN:  [id number] Principal Diagnosis: Schizoaffective disorder, bipolar type (HCC) Diagnosis:  Principal Problem:   Schizoaffective disorder, bipolar type (HCC)   Total Time spent with patient: 30 minutes  Subjective:   [name] is a 35 y.o. male patient admitted with "I am okay".  HPI: Patient seen chart reviewed.  Patient continues to minimize symptoms.  Affect blunted mood bad thoughts seem confused disorganized with paranoid tone.  Has been only partially cooperative with medicine.  Finally did cooperate with getting a COVID test this afternoon allowing [place] to admit him downstairs  Past Psychiatric History: History of longstanding schizoaffective disorder multiple hospitalizations multiple episodes of noncompliance  Risk to Self:   Risk to Others:   Prior Inpatient Therapy:   Prior Outpatient Therapy:    Past Medical History:  Past Medical History:  Diagnosis Date  . Bipolar 1 disorder (HCC)   . Schizoaffective disorder (HCC)    History reviewed. No pertinent surgical history. Family History: History reviewed. No pertinent family history. Family Psychiatric  History: She previous Social History:  Social History   Substance and Sexual Activity  Alcohol Use Not Currently     Social History   Substance and Sexual Activity  Drug Use Not Currently    Social History   Socioeconomic History  . Marital status: Single    Spouse name: Not on file  . Number of children: Not on file  . Years of education: Not on file  . Highest education level: Not on file  Occupational History  . Not on file  Tobacco Use  . Smoking status: Light Tobacco Smoker    Packs/day: 0.25    Types: Cigarettes  . Smokeless tobacco: Never Used  Vaping Use  . Vaping Use: Never  used  Substance and Sexual Activity  . Alcohol use: Not Currently  . Drug use: Not Currently  . Sexual activity: Not on file  Other Topics Concern  . Not on file  Social History Narrative  . Not on file   Social Determinants of Health   Financial Resource Strain: Not on file  Food Insecurity: Not on file  Transportation Needs: Not on file  Physical Activity: Not on file  Stress: Not on file  Social Connections: Not on file   Additional Social History:    Allergies:  No Known Allergies  Labs:  Results for orders placed or performed during the hospital encounter of 10/17/20 (from the past 48 hour(s))  POC SARS Coronavirus 2 Ag-ED - Nasal Swab     Status: None   Collection Time: 10/19/20 12:06 PM  Result Value Ref Range   SARS Coronavirus 2 Ag NEGATIVE NEGATIVE    Comment: (NOTE) SARS-CoV-2 antigen NOT DETECTED.   Negative results are presumptive.  Negative results do not preclude SARS-CoV-2 infection and should not be used as the sole basis for treatment or other patient management decisions, including infection  control decisions, particularly in the presence of clinical signs and  symptoms consistent with COVID-19, or in those who have been in contact with the virus.  Negative results must be combined with clinical observations, patient history, and epidemiological information. The expected result is Negative.  Fact Sheet for Patients: https://www.jennings-kim.com/  Fact Sheet for Healthcare Providers: https://alexander-rogers.biz/  This test is not yet approved or cleared by the [place] and  has been authorized  for detection and/or diagnosis of SARS-CoV-2 by FDA under an Emergency Use Authorization (EUA).  This EUA will remain in effect (meaning this test can be used) for the duration of  the COV ID-19 declaration under Section 564(b)(1) of the Act, 21 U.S.C. section 360bbb-3(b)(1), unless the authorization is terminated or  revoked sooner.      Current Facility-Administered Medications  Medication Dose Route Frequency Provider Last Rate Last Admin  . lurasidone (LATUDA) tablet 20 mg  20 mg Oral Q breakfast [place] Frieden T, MD      . OLANZapine zydis (ZYPREXA) disintegrating tablet 20 mg  20 mg Oral QHS Redell Nazir T, MD   20 mg at 10/17/20 2247  . potassium chloride SA (KLOR-CON) CR tablet 40 mEq  40 mEq Oral Once Irean Hong, MD       Current Outpatient Medications  Medication Sig Dispense Refill  . lurasidone (LATUDA) 20 MG TABS tablet Take 1 tablet (20 mg total) by mouth daily with supper. (Patient not taking: Reported on 10/17/2020) 30 tablet 1  . OLANZapine (ZYPREXA) 20 MG tablet Take 1 tablet (20 mg total) by mouth at bedtime. (Patient not taking: Reported on 10/17/2020) 30 tablet 1    Musculoskeletal: Strength & Muscle Tone: within normal limits Gait & Station: normal Patient leans: N/A            Psychiatric Specialty Exam:  Presentation  General Appearance: No data recorded Eye Contact:No data recorded Speech:No data recorded Speech Volume:No data recorded Handedness:No data recorded  Mood and Affect  Mood:No data recorded Affect:No data recorded  Thought Process  Thought Processes:No data recorded Descriptions of Associations:No data recorded Orientation:No data recorded Thought Content:No data recorded History of Schizophrenia/Schizoaffective disorder:Yes  Duration of Psychotic Symptoms:Greater than six months  Hallucinations:No data recorded Ideas of Reference:No data recorded Suicidal Thoughts:No data recorded Homicidal Thoughts:No data recorded  Sensorium  Memory:No data recorded Judgment:No data recorded Insight:No data recorded  Executive Functions  Concentration:No data recorded Attention Span:No data recorded Recall:No data recorded Fund of Knowledge:No data recorded Language:No data recorded  Psychomotor Activity  Psychomotor Activity:No data  recorded  Assets  Assets:No data recorded  Sleep  Sleep:No data recorded  Physical Exam: Physical Exam Vitals and nursing note reviewed.  Constitutional:      Appearance: Normal appearance.  HENT:     Head: Normocephalic and atraumatic.     Mouth/Throat:     Pharynx: Oropharynx is clear.  Eyes:     Pupils: Pupils are equal, round, and reactive to light.  Cardiovascular:     Rate and Rhythm: Normal rate and regular rhythm.  Pulmonary:     Effort: Pulmonary effort is normal.     Breath sounds: Normal breath sounds.  Abdominal:     General: Abdomen is flat.     Palpations: Abdomen is soft.  Musculoskeletal:        General: Normal range of motion.  Skin:    General: Skin is warm and dry.  Neurological:     General: No focal deficit present.     Mental Status: He is alert. Mental status is at baseline.  Psychiatric:        Mood and Affect: Affect is blunt.        Thought Content: Thought content is paranoid and delusional.        Cognition and Memory: Memory is impaired.    Review of Systems  Constitutional: Negative.   HENT: Negative.   Eyes: Negative.   Respiratory: Negative.  Cardiovascular: Negative.   Gastrointestinal: Negative.   Musculoskeletal: Negative.   Skin: Negative.   Neurological: Negative.   Psychiatric/Behavioral: The patient is nervous/anxious and has insomnia.    Blood pressure 133/82, pulse 95, temperature 98.2 F (36.8 C), temperature source Oral, resp. rate 18, SpO2 99 %. There is no height or weight on file to calculate BMI.  Treatment Plan Summary: Plan Patient has been accepted for admission downstairs.  Now the COVID test has come back negative it will allow him to be admitted.  Encourage patient to be more compliant with medication.  Orders in place.  Case reviewed with inpatient team and TTS.  Disposition: Recommend psychiatric Inpatient admission when medically cleared. Supportive therapy provided about ongoing stressors.  Mordecai Rasmussen, MD 10/19/2020 4:06 PM

## 2020-10-19 NOTE — ED Notes (Signed)
Breakfast tray and drink given.  

## 2020-10-19 NOTE — ED Notes (Signed)
Lunch meal tray given at this time.  

## 2020-10-19 NOTE — ED Notes (Signed)
Pt attempting to come down hall, police officer able to redirect pt back into rm. Pt becoming agitated. Ice water requested and given at this time. No other needs voiced.

## 2020-10-19 NOTE — ED Notes (Signed)
Pt requested meal tray, this SRN gave meal tray and water at this time. Attempted to assess VS and clean up room, pt refused. Will continue to monitor.

## 2020-10-19 NOTE — ED Notes (Signed)
Pt requested food. RN and student attempted to obtain vitals and straighten up room but pt refused. Pt given meal tray at this time.

## 2020-10-19 NOTE — ED Notes (Signed)
Pt states he came in because he took off his pants. Pt denies SI/HI. Pt denies pain at this time.

## 2020-10-19 NOTE — ED Notes (Signed)
Pt provided meal tray. Pt refusing repeat vitals at this time with the NT

## 2020-10-19 NOTE — ED Notes (Signed)
18 minute call with Crystal (gaurdian via Elwyn Lade phone)

## 2020-10-19 NOTE — ED Notes (Addendum)
Pt refusing VS at this time.

## 2020-10-19 NOTE — ED Notes (Signed)
Pt refused VS and morning medication at this time. Pt states, "im not doing nothing, yall won't let me go home." Explained to the pt that being uncooperative will prolong his stay. Pt states, "my loss."

## 2020-10-19 NOTE — ED Notes (Signed)
TTS and psych reports minimal staffing in [facility] - reports after the night calms possible admission for pt

## 2020-10-19 NOTE — ED Provider Notes (Signed)
Emergency Medicine Observation Re-evaluation Note  [name] is a 35 y.o. male, seen on rounds today.  Pt initially presented to the ED for complaints of Mental Health Problem Patient is currently resting comfortably.  No acute events overnight.  Physical Exam  BP 133/82 (BP Location: Right Arm)   Pulse 95   Temp 98.2 F (36.8 C) (Oral)   Resp 18   SpO2 99%    ED Course / MDM   No new lab work over the past 24 hours.  Plan  Current plan is for psychiatric placement.. Patient is under full IVC at this time.   Minna Antis, MD 10/19/20 [phone]

## 2020-10-19 NOTE — ED Notes (Signed)
Pt in bathroom at this time.

## 2020-10-19 NOTE — ED Notes (Signed)
Pt standing at doorway refusing to get back into rm at this time. Officer remains at side redirecting pt back into rm.

## 2020-10-19 NOTE — ED Notes (Signed)
Patient continues to refuse all medications, vital signs, and COVID testing. Patient calm at this time in room.

## 2020-10-19 NOTE — ED Notes (Signed)
Pt noted to be hitting wall and pt sitting on floor. RN went into room and asked pt to not hit the wall. Pt was okay and not hitting wall currently. RN asked if he wanted to get into bed and pt stated "why would I want to get into the bed." Pt is currently calm.

## 2020-10-19 NOTE — ED Notes (Signed)
IVC/Pending Placement 

## 2020-10-20 ENCOUNTER — Inpatient Hospital Stay
Admission: EM | Admit: 2020-10-20 | Discharge: 2020-11-13 | DRG: 885 | Disposition: A | Discharge status: Other Acute Inpatient Hospital | Payer: No Typology Code available for payment source | Source: Ambulatory Visit | Attending: Behavioral Health | Admitting: Behavioral Health

## 2020-10-20 ENCOUNTER — Encounter: Payer: Self-pay | Admitting: Psychiatry

## 2020-10-20 ENCOUNTER — Other Ambulatory Visit: Payer: Self-pay

## 2020-10-20 DIAGNOSIS — Z91199 Patient's noncompliance with other medical treatment and regimen due to unspecified reason: Secondary | ICD-10-CM

## 2020-10-20 DIAGNOSIS — Z9114 Patient's other noncompliance with medication regimen: Secondary | ICD-10-CM

## 2020-10-20 DIAGNOSIS — Z9119 Patient's noncompliance with other medical treatment and regimen: Secondary | ICD-10-CM

## 2020-10-20 DIAGNOSIS — F17210 Nicotine dependence, cigarettes, uncomplicated: Secondary | ICD-10-CM | POA: Diagnosis present

## 2020-10-20 DIAGNOSIS — F250 Schizoaffective disorder, bipolar type: Principal | ICD-10-CM | POA: Diagnosis present

## 2020-10-20 DIAGNOSIS — Z20822 Contact with and (suspected) exposure to covid-19: Secondary | ICD-10-CM | POA: Diagnosis present

## 2020-10-20 MED ORDER — OLANZAPINE 5 MG PO TBDP
10.0000 mg | ORAL_TABLET | Freq: Four times a day (QID) | ORAL | Status: DC | PRN
Start: 2020-10-20 — End: 2020-10-26

## 2020-10-20 MED ORDER — HYDROXYZINE HCL 50 MG PO TABS
50.0000 mg | ORAL_TABLET | Freq: Three times a day (TID) | ORAL | Status: DC | PRN
  Administered 2020-10-27 – 2020-11-11 (×8): 50 mg via ORAL
  Filled 2020-10-20 (×7): qty 1

## 2020-10-20 MED ORDER — LURASIDONE HCL 40 MG PO TABS
20.0000 mg | ORAL_TABLET | Freq: Every day | ORAL | Status: DC
  Filled 2020-10-20 (×3): qty 1

## 2020-10-20 MED ORDER — MAGNESIUM HYDROXIDE 400 MG/5ML PO SUSP: 30.0000 mL | Freq: Every day | ORAL | Status: DC | PRN

## 2020-10-20 MED ORDER — ALUM & MAG HYDROXIDE-SIMETH 200-200-20 MG/5ML PO SUSP
30.0000 mL | ORAL | Status: DC | PRN
  Administered 2020-11-04: 30 mL via ORAL
  Filled 2020-10-20: qty 30

## 2020-10-20 MED ORDER — ACETAMINOPHEN 325 MG PO TABS: 650.0000 mg | ORAL_TABLET | Freq: Four times a day (QID) | ORAL | Status: DC | PRN

## 2020-10-20 MED ORDER — OLANZAPINE 10 MG IM SOLR
10.0000 mg | Freq: Four times a day (QID) | INTRAMUSCULAR | Status: DC | PRN
  Administered 2020-10-23 – 2020-10-26 (×2): 10 mg via INTRAMUSCULAR
  Filled 2020-10-20 (×2): qty 10

## 2020-10-20 MED ORDER — OLANZAPINE 5 MG PO TBDP: 20.0000 mg | ORAL_TABLET | Freq: Every day | ORAL | Status: DC

## 2020-10-20 NOTE — H&P (Addendum)
Psychiatric Admission Assessment Adult  Patient Identification: Jackson [name] MRN:  [id number] Date of Evaluation:  10/20/2020 Chief Complaint:  Schizoaffective disorder, bipolar type (HCC) [F25.0] Principal Diagnosis: <principal problem not specified> Diagnosis:  Active Problems:   Schizoaffective disorder, bipolar type (HCC)  History of Present Illness:   Jackson [name] is a 34 year old male with a history of schizoaffective disorder who was recently admitted to [facility] from February 14 to September 28, 2020. Reports indicate that he lives independently but does have a legal guardian. Apparently he was at a group home but the owner had passed away last year.He was brought to the emergency department on October 17, 2020 by law enforcement after he was found walking naked down the street. He was noted to be slightly agitated and minimally cooperative, avoid questions. In the Emergency department, he was naked his room, jumping up and down, and twirling. IM medications were administered.  Patient reports that there has been nothing new since his last admission. No stressors or challenges. Denies the use of drugs and alcohol. Reports that he has been taking both Zyprexa and Geodon. Tells me that is a "beautiful day" today and he is "just chilling," and does not plan on taking Zyprexa or Latuda, or any other medications. During his last admsision, he emphasized to me just how much the zyprexa helped him. He states that this is no longer the case. Denies walking down the street without clothes on. He is calm during the interview today, but was later her laughing out loud in his room.  Past Psychiatric History:   Numerous Admissions. History of schizoaffective disorder, bipolar type. Multiple past hospilizations with extended [facility] stay [facility]. Previously stabilized on Abilify, Zyprexa, and Depakote. No past suicide attempts, history of aggression.     Prior Inpatient Therapy:   Prior Outpatient Therapy:    Alcohol Screening: 1. How often do you have a drink containing alcohol?: Never 2. How many drinks containing alcohol do you have on a typical day when you are drinking?: 1 or 2 3. How often do you have six or more drinks on one occasion?: Never AUDIT-C Score: 0 4. How often during the last year have you found that you were not able to stop drinking once you had started?: Never 5. How often during the last year have you failed to do what was normally expected from you because of drinking?: Never 6. How often during the last year have you needed a first drink in the morning to get yourself going after a heavy drinking session?: Never 7. How often during the last year have you had a feeling of guilt of remorse after drinking?: Never 8. How often during the last year have you been unable to remember what happened the night before because you had been drinking?: Never 9. Have you or someone else been injured as a result of your drinking?: No 10. Has a relative or friend or a doctor or another health worker been concerned about your drinking or suggested you cut down?: No Alcohol Use Disorder Identification Test Final Score (AUDIT): 0 Alcohol Brief Interventions/Follow-up: AUDIT Score <7 follow-up not indicated Substance Abuse History in the last 12 months:  No. Consequences of Substance Abuse: Negative Previous Psychotropic Medications: Yes  Psychological Evaluations: Yes  Past Medical History:  Past Medical History:  Diagnosis Date  . Bipolar 1 disorder (HCC)   . Schizoaffective disorder (HCC)    History reviewed. No pertinent  surgical history. Family History: History reviewed. No pertinent family history. Family Psychiatric  History: unknown Tobacco Screening:   Social History:  Social History   Substance and Sexual Activity  Alcohol Use Not Currently     Social History   Substance and Sexual Activity  Drug Use Not  Currently    Additional Social History:                           Allergies:  No Known Allergies Lab Results:  Results for orders placed or performed during the hospital encounter of 10/17/20 (from the past 48 hour(s))  POC SARS Coronavirus 2 Ag-ED - Nasal Swab     Status: None   Collection Time: 10/19/20 12:06 PM  Result Value Ref Range   SARS Coronavirus 2 Ag NEGATIVE NEGATIVE    Comment: (NOTE) SARS-CoV-2 antigen NOT DETECTED.   Negative results are presumptive.  Negative results do not preclude SARS-CoV-2 infection and should not be used as the sole basis for treatment or other patient management decisions, including infection  control decisions, particularly in the presence of clinical signs and  symptoms consistent with COVID-19, or in those who have been in contact with the virus.  Negative results must be combined with clinical observations, patient history, and epidemiological information. The expected result is Negative.  Fact Sheet for Patients: https://www.jennings-kim.com/  Fact Sheet for Healthcare Providers: https://alexander-rogers.biz/  This test is not yet approved or cleared by the [place] FDA and  has been authorized for detection and/or diagnosis of SARS-CoV-2 by FDA under an Emergency Use Authorization (EUA).  This EUA will remain in effect (meaning this test can be used) for the duration of  the COV ID-19 declaration under Section 564(b)(1) of the Act, 21 U.S.C. section 360bbb-3(b)(1), unless the authorization is terminated or revoked sooner.      Blood Alcohol level:  Lab Results  Component Value Date   ETH <10 10/17/2020   ETH <10 08/11/2020    Metabolic Disorder Labs:  Lab Results  Component Value Date   HGBA1C 5.2 10/23/2018   MPG 102.54 10/23/2018   No results found for: PROLACTIN Lab Results  Component Value Date   CHOL 129 10/23/2018   TRIG 58 10/23/2018   HDL 50 10/23/2018   CHOLHDL 2.6  10/23/2018   VLDL 12 10/23/2018   LDLCALC 67 10/23/2018    Current Medications: Current Facility-Administered Medications  Medication Dose Route Frequency Provider Last Rate Last Admin  . acetaminophen (TYLENOL) tablet 650 mg  650 mg Oral Q6H PRN [name], [name], MD      . alum & mag hydroxide-simeth (MAALOX/MYLANTA) 200-200-20 MG/5ML suspension 30 mL  30 mL Oral Q4H PRN [name], [name], MD      . hydrOXYzine (ATARAX/VISTARIL) tablet 50 mg  50 mg Oral TID PRN [name], [name], MD      . lurasidone (LATUDA) tablet 20 mg  20 mg Oral Q breakfast [name], [name], MD      . magnesium hydroxide (MILK OF MAGNESIA) suspension 30 mL  30 mL Oral Daily PRN [name], [name], MD      . OLANZapine zydis (ZYPREXA) disintegrating tablet 20 mg  20 mg Oral QHS [name], [name], MD       PTA Medications: Medications Prior to Admission  Medication Sig Dispense Refill Last Dose  . lurasidone (LATUDA) 20 MG TABS tablet Take 1 tablet (20 mg total) by mouth daily with supper. (Patient not taking:  Reported on 10/17/2020) 30 tablet 1   . OLANZapine (ZYPREXA) 20 MG tablet Take 1 tablet (20 mg total) by mouth at bedtime. (Patient not taking: Reported on 10/17/2020) 30 tablet 1     Musculoskeletal: Strength & Muscle Tone: within normal limits Gait & Station: normal Patient leans: N/A  Psychiatric Specialty Exam:  Presentation  General Appearance: poorly groomed Eye Contact:Fair  Speech:Normal Rate  Speech Volume:Decreased  Handedness:Right   Mood and Affect  Mood:Unable to acertain  Affect: Blunted  Thought Process  Thought Processes:Goal Directed  Descriptions of Associations:Intact  Orientation:Full (Time, Place and Person)  Thought Content:Logical  History of Schizophrenia/Schizoaffective disorder:Yes  Duration of Psychotic Symptoms: pt denies   Hallucinations:Auditory hallucinations - pt denies Ideas of Reference:None  Suicidal Thoughts:No  Homicidal Thoughts:No     Sensorium  Memory:Immediate Fair; Recent Poor; Remote Poor  Judgment:Intact  Insight:Shallow  Executive Functions  Concentration:poor  Attention Span:poor  Recall:Poor  Fund of Knowledge:Fair  Language:Poor  Psychomotor Activity  Psychomotor Activity:No data recorded  Physical Exam: Physical Exam ROS Blood pressure (!) 134/99, pulse 64, temperature 98.3 F (36.8 C), temperature source Oral, resp. rate 18, height 6' (1.829 m), weight 113 kg, SpO2 100 %. Body mass index is 33.79 kg/m.  Treatment Plan Summary: 3/19 Encouraged to take medication Uphold IVC. Will complete second exam. Patient may need to force medication order Labs and test reviewed Pt has not provided UDS Establish and maintain a therapeutic alliance with the pt.  Educate, treat, and montior pt's mental illness PO/IM Meds for aggression started.      Daily contact with patient to assess and evaluate symptoms and progress in treatment  Physician Treatment Plan for Primary Diagnosis: <principal problem not specified> Long Term Goal(s): Improvement in symptoms so as ready for discharge  Short Term Goals:Pt safety.   Physician Treatment Plan for Secondary Diagnosis: Active Problems:   Schizoaffective disorder, bipolar type (HCC)  Long Term Goal(s): Improvement in symptoms so as ready for discharge  Short Term Goals: Medication compliance     I certify that inpatient services furnished can reasonably be expected to improve the patient's condition.    Reggie Pile, MD 3/19/202211:15 AM

## 2020-10-20 NOTE — Progress Notes (Signed)
Patent admitted from [facility] after walking naked outside. He is Ox4 remains paranoid, suspicious and uncooperative with care. He denies all psychiatric symptoms and reports his behavior was due to stupidity. He is currently in bed resting quietly at this time.
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
## 2020-10-20 NOTE — BHH Suicide Risk Assessment (Signed)
BHH INPATIENT:  Family/Significant Other Suicide Prevention Education  Suicide Prevention Education:  Patient Refusal for Family/Significant Other Suicide Prevention Education: The patient Osamu Newcomer has refused to provide written consent for family/significant other to be provided Family/Significant Other Suicide Prevention Education during admission and/or prior to discharge.  Physician notified.  Carollee Herter  Symeon Puleo 10/20/2020, 2:08 PM

## 2020-10-20 NOTE — Tx Team (Signed)
Initial Treatment Plan 10/20/2020 2:39 AM [name] PPJ:[id number]    PATIENT STRESSORS: Medication change or noncompliance   PATIENT STRENGTHS: Communication skills General fund of knowledge Supportive family/friends   PATIENT IDENTIFIED PROBLEMS: Medication non compliance  " I was walking down the street naked, just stupidity"  Bipolar  Schizoaffective disorder               DISCHARGE CRITERIA:  Ability to meet basic life and health needs Improved stabilization in mood, thinking, and/or behavior Motivation to continue treatment in a less acute level of care Verbal commitment to aftercare and medication compliance  PRELIMINARY DISCHARGE PLAN: Outpatient therapy Return to previous living arrangement  PATIENT/FAMILY INVOLVEMENT: This treatment plan has been presented to and reviewed with the patient, [name], and/or family member.  The patient and have been given the opportunity to ask questions and make suggestions.  Margarita Rana, RN 10/20/2020, 2:39 AM

## 2020-10-20 NOTE — BHH Group Notes (Signed)
BHH Group Notes: (Clinical Social Work)   10/20/2020      Type of Therapy:  Group Therapy   Participation Level:  Did Not Attend - was invited individually by Nurse/MHT and chose not to attend.  Patient was asleep   Susa Simmonds, LCSWA 10/20/2020  1:20 PM

## 2020-10-20 NOTE — Progress Notes (Signed)
Pt is alert and oriented to person, place, time and situation. Pt is noted to be hyperactive, pacing the hallways, responding to internal stimuli. Pt's thoughts can be disorganized at times. Pt was offered his scheduled meds and his PRN medication, but refused both. Pt denies any need to be in the hospital and reports she should not be here. Dr. Reita May was notified that he refused his scheduled medicaitons. Pt is intrusive and impulsive when other pts are in the medication room with their nurse getting their meds, pt will walk in and loudly interrupt and start talking to the nurse. Pt requires frequent redirections from this behavior, and is not easily redirected. Pt also noted to be yelling out from his room, but when staff arrived to check on patient, pt denies that he yelled out. Pt denies suicidal and homicidal ideation. Pt was smiles and dancing earlier, then he started talking to someone that was note present having auditory hallucinations, and said in an angry tone of voices, "I'm trying to find out who's fucking with me." Pt will also burst out in loud laughter suddenly when no one is around. Will continue to monitor pt per Q15 minute face checks and monitor for safety and progress.

## 2020-10-20 NOTE — Plan of Care (Signed)
  Problem: Activity: Goal: Will verbalize the importance of balancing activity with adequate rest periods Outcome: Not Progressing   Problem: Education: Goal: Will be free of psychotic symptoms Outcome: Not Progressing Goal: Knowledge of the prescribed therapeutic regimen will improve Outcome: Not Progressing   Problem: Coping: Goal: Coping ability will improve Outcome: Not Progressing Goal: Will verbalize feelings Outcome: Not Progressing   Problem: Health Behavior/Discharge Planning: Goal: Compliance with prescribed medication regimen will improve Outcome: Not Progressing   Problem: Nutritional: Goal: Ability to achieve adequate nutritional intake will improve Outcome: Not Progressing   Problem: Role Relationship: Goal: Ability to communicate needs accurately will improve Outcome: Not Progressing Goal: Ability to interact with others will improve Outcome: Not Progressing   Problem: Safety: Goal: Ability to redirect hostility and anger into socially appropriate behaviors will improve Outcome: Not Progressing Goal: Ability to remain free from injury will improve Outcome: Not Progressing   Problem: Self-Care: Goal: Ability to participate in self-care as condition permits will improve Outcome: Not Progressing   Problem: Self-Concept: Goal: Will verbalize positive feelings about self Outcome: Not Progressing   Problem: Education: Goal: Knowledge of [place] [facility] Education information/materials will improve Outcome: Not Progressing Goal: Emotional status will improve Outcome: Not Progressing Goal: Mental status will improve Outcome: Not Progressing Goal: Verbalization of understanding the information provided will improve Outcome: Not Progressing   Problem: Activity: Goal: Interest or engagement in activities will improve Outcome: Not Progressing Goal: Sleeping patterns will improve Outcome: Not Progressing   Problem: Coping: Goal: Ability to verbalize  frustrations and anger appropriately will improve Outcome: Not Progressing Goal: Ability to demonstrate self-control will improve Outcome: Not Progressing   Problem: Health Behavior/Discharge Planning: Goal: Identification of resources available to assist in meeting health care needs will improve Outcome: Not Progressing Goal: Compliance with treatment plan for underlying cause of condition will improve Outcome: Not Progressing   Problem: Physical Regulation: Goal: Ability to maintain clinical measurements within normal limits will improve Outcome: Not Progressing   Problem: Safety: Goal: Periods of time without injury will increase Outcome: Not Progressing   Problem: Education: Goal: Ability to state activities that reduce stress will improve Outcome: Not Progressing   Problem: Coping: Goal: Ability to identify and develop effective coping behavior will improve Outcome: Not Progressing   Problem: Self-Concept: Goal: Ability to identify factors that promote anxiety will improve Outcome: Not Progressing Goal: Level of anxiety will decrease Outcome: Not Progressing Goal: Ability to modify response to factors that promote anxiety will improve Outcome: Not Progressing

## 2020-10-20 NOTE — Progress Notes (Signed)
Patient is very active this evening pacing the hallways and coming to the nurses station making weird hand gestures and talking to himself. He is redirectable and isn't a behavior issue at this time.  He denies SI  HI  AVH depression anxiety and pain and states that he dies not need to be in the hospital. He continues to refuse his prescribed medication again stating that he does not need to be hospitalized.  He continues to be monitored with 15 minute safety rounds.     Cleo Butler-Nicholson, LPN

## 2020-10-21 NOTE — BHH Counselor (Signed)
LCSW Group Therapy Note  10/21/2020 2:04 PM  Type of Therapy/Topic:  Group Therapy:  Feelings about Diagnosis  Participation Level:  Did Not Attend   Description of Group:   This group will allow patients to explore their thoughts and feelings about diagnoses they have received. Patients will be guided to explore their level of understanding and acceptance of these diagnoses. Facilitator will encourage patients to process their thoughts and feelings about the reactions of others to their diagnosis and will guide patients in identifying ways to discuss their diagnosis with significant others in their lives. This group will be process-oriented, with patients participating in exploration of their own experiences, giving and receiving support, and processing challenge from other group members.   Therapeutic Goals: 1. Patient will demonstrate understanding of diagnosis as evidenced by identifying two or more symptoms of the disorder 2. Patient will be able to express two feelings regarding the diagnosis 3. Patient will demonstrate their ability to communicate their needs through discussion and/or role play  Summary of Patient Progress: Patient did not attend group despite encouraged participation.     Therapeutic Modalities:   Cognitive Behavioral Therapy Brief Therapy Feelings Identification   Gwenevere Ghazi, MSW, [place], [place] 10/21/2020 2:04 PM

## 2020-10-21 NOTE — Progress Notes (Addendum)
[facility] MD Progress Note  10/21/2020 10:27 AM Jackson [name]  MRN:  [id number] Subjective:   Thus far, the patient has refused to take schedule psychiatric medication, Lutada and zyprexa. Per staff and nursing report, he makes odd hand gestures and is occasionally intrusive but has not been aggressive. He will burst out into laughter randomly.  This morning, he was seen sleeping. Tells me that he had a rough night, but does not convey why when asked. I asked if he will take his psychiatric medication and he declines again. Denies auditory and visual and tactile hallucinations. Denies various forms delusions. He is not talking excessively as noted during his last psychiatric admission.   No suicidal or homicidal ideations. "I'm on auto pilot right now.   No additional information provided.   Principal Problem: <principal problem not specified> Diagnosis: Active Problems:   Schizoaffective disorder, bipolar type (HCC)  Total Time spent with patient: 25 minutes  Past Medical History:  Past Medical History:  Diagnosis Date  . Bipolar 1 disorder (HCC)   . Schizoaffective disorder (HCC)    History reviewed. No pertinent surgical history. Family History: History reviewed. No pertinent family history. Social History:  Social History   Substance and Sexual Activity  Alcohol Use Not Currently     Social History   Substance and Sexual Activity  Drug Use Not Currently    Social History   Socioeconomic History  . Marital status: Single    Spouse name: Not on file  . Number of children: Not on file  . Years of education: Not on file  . Highest education level: Not on file  Occupational History  . Not on file  Tobacco Use  . Smoking status: Light Tobacco Smoker    Packs/day: 0.25    Types: Cigarettes  . Smokeless tobacco: Never Used  Vaping Use  . Vaping Use: Never used  Substance and Sexual Activity  . Alcohol use: Not Currently  . Drug use: Not Currently  . Sexual activity:  Not on file  Other Topics Concern  . Not on file  Social History Narrative  . Not on file   Social Determinants of Health   Financial Resource Strain: Not on file  Food Insecurity: Not on file  Transportation Needs: Not on file  Physical Activity: Not on file  Stress: Not on file  Social Connections: Not on file   Additional Social History:                         Sleep: Poor  Appetite:  Fair  Current Medications: Current Facility-Administered Medications  Medication Dose Route Frequency Provider Last Rate Last Admin  . acetaminophen (TYLENOL) tablet 650 mg  650 mg Oral Q6H PRN [name], [name], MD      . alum & mag hydroxide-simeth (MAALOX/MYLANTA) 200-200-20 MG/5ML suspension 30 mL  30 mL Oral Q4H PRN [name], [name], MD      . hydrOXYzine (ATARAX/VISTARIL) tablet 50 mg  50 mg Oral TID PRN [name], [name], MD      . lurasidone (LATUDA) tablet 20 mg  20 mg Oral Q breakfast [name], [name], MD      . magnesium hydroxide (MILK OF MAGNESIA) suspension 30 mL  30 mL Oral Daily PRN [name], [name], MD      . OLANZapine zydis (ZYPREXA) disintegrating tablet 10 mg  10 mg Oral QID PRN Reggie Pile, MD       Or  . OLANZapine (  ZYPREXA) injection 10 mg  10 mg Intramuscular QID PRN Reggie Pile, MD      . OLANZapine zydis (ZYPREXA) disintegrating tablet 20 mg  20 mg Oral QHS [name], [name], MD        Lab Results:  Results for orders placed or performed during the hospital encounter of 10/17/20 (from the past 48 hour(s))  POC SARS Coronavirus 2 Ag-ED - Nasal Swab     Status: None   Collection Time: 10/19/20 12:06 PM  Result Value Ref Range   SARS Coronavirus 2 Ag NEGATIVE NEGATIVE    Comment: (NOTE) SARS-CoV-2 antigen NOT DETECTED.   Negative results are presumptive.  Negative results do not preclude SARS-CoV-2 infection and should not be used as the sole basis for treatment or other patient management decisions, including infection  control decisions, particularly in the  presence of clinical signs and  symptoms consistent with COVID-19, or in those who have been in contact with the virus.  Negative results must be combined with clinical observations, patient history, and epidemiological information. The expected result is Negative.  Fact Sheet for Patients: https://www.jennings-kim.com/  Fact Sheet for Healthcare Providers: https://alexander-rogers.biz/  This test is not yet approved or cleared by the [place] FDA and  has been authorized for detection and/or diagnosis of SARS-CoV-2 by FDA under an Emergency Use Authorization (EUA).  This EUA will remain in effect (meaning this test can be used) for the duration of  the COV ID-19 declaration under Section 564(b)(1) of the Act, 21 U.S.C. section 360bbb-3(b)(1), unless the authorization is terminated or revoked sooner.      Blood Alcohol level:  Lab Results  Component Value Date   ETH <10 10/17/2020   ETH <10 08/11/2020    Metabolic Disorder Labs: Lab Results  Component Value Date   HGBA1C 5.2 10/23/2018   MPG 102.54 10/23/2018   No results found for: PROLACTIN Lab Results  Component Value Date   CHOL 129 10/23/2018   TRIG 58 10/23/2018   HDL 50 10/23/2018   CHOLHDL 2.6 10/23/2018   VLDL 12 10/23/2018   LDLCALC 67 10/23/2018    Physical Findings: AIMS: Facial and Oral Movements Muscles of Facial Expression: None, normal Lips and Perioral Area: None, normal Jaw: None, normal Tongue: None, normal,Extremity Movements Upper (arms, wrists, hands, fingers): None, normal Lower (legs, knees, ankles, toes): None, normal, Trunk Movements Neck, shoulders, hips: None, normal, Overall Severity Severity of abnormal movements (highest score from questions above): None, normal Incapacitation due to abnormal movements: None, normal Patient's awareness of abnormal movements (rate only patient's report): No Awareness, Dental Status Current problems with teeth and/or  dentures?: No Does patient usually wear dentures?: No  CIWA:    COWS:     Musculoskeletal: Strength & Muscle Tone: within normal limits Gait & Station: normal Patient leans: N/A  Psychiatric Specialty Exam:  Presentation  General Appearance: poorly groomed Eye Contact:Fair  Speech:Normal Rate  Speech Volume:Decreased  Handedness:Right   Mood and Affect  Mood:Unable to acertain  Affect: Blunted  Thought Process  Thought Processes:Goal Directed  Descriptions of Associations:Intact  Orientation:Full (Time, Place and Person)  Thought Content:Logical  History of Schizophrenia/Schizoaffective disorder:Yes  Duration of Psychotic Symptoms: pt denies   Hallucinations:Auditory hallucinations - pt denies Ideas of Reference:None  Suicidal Thoughts:No  Homicidal Thoughts:No    Sensorium  Memory poor  Judgment:Intact  Insight:Shallow   Executive Functions  Concentration:poor  Attention Span:poor  Recall:Poor  Fund of Knowledge:poor  Language:Poor   Psychomotor Activity  Psychomotor Activity:No data  recorded   Assets  Assets:Desire for Improvement; Financial Resources/Insurance; Resilience   Sleep  Sleep:No data recorded   Physical Exam: Physical Exam ROS Blood pressure (!) 134/99, pulse 64, temperature 98.3 F (36.8 C), temperature source Oral, resp. rate 18, height 6' (1.829 m), weight 113 kg, SpO2 100 %. Body mass index is 33.79 kg/m.  Treatment Plan Summary: 3/19 Encouraged to take medication Uphold IVC. Will complete second exam. Patient may need to force medication order Labs and test reviewed Pt has not provided UDS Establish and maintain a therapeutic alliance with the pt.  Educate, treat, and montior pt's mental illness PO/IM Meds for aggression started.  Physical Exam: Physical Exam ROS Blood pressure (!) 134/99, pulse 64, temperature 98.3 F (36.8 C), temperature source Oral, resp.  rate 18, height 6' (1.829 m), weight 113 kg, SpO2 100 %. Body mass index is 33.79 kg/m.   Treatment Plan Summary: 3/19 Encouraged to take medication Uphold IVC. Will complete second exam. Patient may need to force medication order Labs and test reviewed Pt has not provided UDS Establish and maintain a therapeutic alliance with the pt.  Educate, treat, and montior pt's mental illness PO/IM Meds for aggression started.   3/20 Encouraged to take meds May require forced medications Pt would benefit from ACTT Services  CPT: 19622  Reggie Pile, MD 10/21/2020, 10:27 AM

## 2020-10-21 NOTE — Progress Notes (Signed)
Pt is alert and oriented to person, place, time and situation. Pt is calm, cooperative, isolates in his room, disrobes and spends most of the day nude in his room standing in the window and laying in the bed. Pt refused scheduled medication this morning, MD unit psychiatrist notified. Pt denies suicidal and homicidal ideation, denies hallucinations, denies feelings of depression and anxiety. Pt forwards little, at times is noted to be responding to internal stimuli, and laughs loudly. No distress noted none reported. Will continue to monitor pt per Q15 minute face checks and monitor for safety and progress.

## 2020-10-21 NOTE — BHH Suicide Risk Assessment (Signed)
[facility] Admission Suicide Risk Assessment   Nursing information obtained from:    Demographic factors:  Male,Adolescent or young adult,Unemployed,Living alone Current Mental Status:  NA Loss Factors:  NA Historical Factors:  NA Risk Reduction Factors:  Positive social support  Total Time spent with patient: 1 hour Principal Problem: <principal problem not specified> Diagnosis:  Active Problems:   Schizoaffective disorder, bipolar type (HCC)  Subjective Data:  Mr. Mctavish is a 35 year old male with a history of schizoaffective disorder who was recently admitted to [facility] from February 14 to September 28, 2020. Reports indicate that he lives independently but does have a legal guardian. Apparently he was at a group home but the owner had passed away last year.He was brought to the emergency department on October 17, 2020 by law enforcement after he was found walking naked down the street. He was noted to be slightly agitated and minimally cooperative, avoid questions. In the Emergency department, he was naked his room, jumping up and down, and twirling. IM medications were administered.  Patient reports that there has been nothing new since his last admission. No stressors or challenges. Denies the use of drugs and alcohol. Reports that he has been taking both Zyprexa and Geodon. Tells me that is a "beautiful day" today and he is "just chilling," and does not plan on taking Zyprexa or Latuda, or any other medications. During his last admsision, he emphasized to me just how much the zyprexa helped him. He states that this is no longer the case. Denies walking down the street without clothes on. He is calm during the interview today, but was later her laughing out loud in his room.  Continued Clinical Symptoms:  Alcohol Use Disorder Identification Test Final Score (AUDIT): 0 The "Alcohol Use Disorders Identification Test", Guidelines for Use in Primary Care, Second  Edition.  World Science writer [facility]). Score between 0-7:  no or low risk or alcohol related problems. Score between 8-15:  moderate risk of alcohol related problems. Score between 16-19:  high risk of alcohol related problems. Score 20 or above:  warrants further diagnostic evaluation for alcohol dependence and treatment.   CLINICAL FACTORS:   Bipolar Disorder:   Mixed State  Musculoskeletal: Strength & Muscle Tone:within normal limits Gait & Station:normal Patient leans:N/A  Psychiatric Specialty Exam:  Presentation General Appearance:poorly groomed Eye Contact:Fair  Speech:Normal Rate  Speech Volume:Decreased  Handedness:Right   Mood and Affect Mood:Unable to acertain  Affect: Blunted  Thought Process Thought Processes:Goal Directed  Descriptions of Associations:Intact  Orientation:Full (Time, Place and Person)  Thought Content:Logical  History of Schizophrenia/Schizoaffective disorder:Yes  Duration of Psychotic Symptoms: pt denies   Hallucinations:Auditory hallucinations - pt denies Ideas of Reference:None  Suicidal Thoughts:No  Homicidal Thoughts:No    Sensorium Memory:Immediate Fair; Recent Poor; Remote Poor  Judgment:Intact  Insight:Shallow  Executive Functions Concentration:poor  Attention Span:poor  Recall:Poor  Fund of Knowledge:Fair  Language:Poor  Psychomotor Activity Psychomotor Activity:No data recorded COGNITIVE FEATURES THAT CONTRIBUTE TO RISK:  Thought constriction (tunnel vision)    SUICIDE RISK:   Severe:  Frequent, intense, and enduring suicidal ideation, specific plan, no subjective intent, but some objective markers of intent (i.e., choice of lethal method), the method is accessible, some limited preparatory behavior, evidence of impaired self-control, severe dysphoria/symptomatology, multiple risk factors present, and few if any protective factors, particularly a lack of social  support.  PLAN OF CARE: Adjust medications and provide patient with support.   I certify that inpatient services  furnished can reasonably be expected to improve the patient's condition.   Reggie Pile, MD 10/21/2020, 2:31 PM

## 2020-10-21 NOTE — Plan of Care (Signed)
  Problem: Activity: Goal: Will verbalize the importance of balancing activity with adequate rest periods Outcome: Not Progressing   Problem: Education: Goal: Will be free of psychotic symptoms Outcome: Not Progressing Goal: Knowledge of the prescribed therapeutic regimen will improve Outcome: Not Progressing   Problem: Coping: Goal: Coping ability will improve Outcome: Not Progressing Goal: Will verbalize feelings Outcome: Not Progressing   Problem: Health Behavior/Discharge Planning: Goal: Compliance with prescribed medication regimen will improve Outcome: Not Progressing   Problem: Nutritional: Goal: Ability to achieve adequate nutritional intake will improve Outcome: Not Progressing   Problem: Role Relationship: Goal: Ability to communicate needs accurately will improve Outcome: Not Progressing Goal: Ability to interact with others will improve Outcome: Not Progressing   Problem: Safety: Goal: Ability to redirect hostility and anger into socially appropriate behaviors will improve Outcome: Not Progressing Goal: Ability to remain free from injury will improve Outcome: Not Progressing   Problem: Self-Care: Goal: Ability to participate in self-care as condition permits will improve Outcome: Not Progressing   Problem: Self-Concept: Goal: Will verbalize positive feelings about self Outcome: Not Progressing   Problem: Education: Goal: Knowledge of [place] [facility] Education information/materials will improve Outcome: Not Progressing Goal: Emotional status will improve Outcome: Not Progressing Goal: Mental status will improve Outcome: Not Progressing Goal: Verbalization of understanding the information provided will improve Outcome: Not Progressing   Problem: Activity: Goal: Interest or engagement in activities will improve Outcome: Not Progressing Goal: Sleeping patterns will improve Outcome: Not Progressing   Problem: Coping: Goal: Ability to verbalize  frustrations and anger appropriately will improve Outcome: Not Progressing Goal: Ability to demonstrate self-control will improve Outcome: Not Progressing   Problem: Health Behavior/Discharge Planning: Goal: Identification of resources available to assist in meeting health care needs will improve Outcome: Not Progressing Goal: Compliance with treatment plan for underlying cause of condition will improve Outcome: Not Progressing   Problem: Physical Regulation: Goal: Ability to maintain clinical measurements within normal limits will improve Outcome: Not Progressing   Problem: Safety: Goal: Periods of time without injury will increase Outcome: Not Progressing   Problem: Education: Goal: Ability to state activities that reduce stress will improve Outcome: Not Progressing   Problem: Coping: Goal: Ability to identify and develop effective coping behavior will improve Outcome: Not Progressing   Problem: Self-Concept: Goal: Ability to identify factors that promote anxiety will improve Outcome: Not Progressing Goal: Level of anxiety will decrease Outcome: Not Progressing Goal: Ability to modify response to factors that promote anxiety will improve Outcome: Not Progressing   

## 2020-10-21 NOTE — BHH Counselor (Signed)
CSW attempted to meet with patient while completing rounds. Patient was in room undressed.Additionally, patient has been observed yelling in room throughout the day. CSW will make additional attempts to meet with patient to assess social work needs.   Signed:  Corky Crafts, MSW, [place], LCASA 10/21/2020 10:55 AM

## 2020-10-22 DIAGNOSIS — F250 Schizoaffective disorder, bipolar type: Principal | ICD-10-CM

## 2020-10-22 MED ORDER — OLANZAPINE 10 MG IM SOLR
10.0000 mg | Freq: Every day | INTRAMUSCULAR | Status: DC
  Administered 2020-10-22: 10 mg via INTRAMUSCULAR
  Filled 2020-10-22 (×2): qty 10

## 2020-10-22 MED ORDER — HYDRALAZINE HCL 10 MG PO TABS
10.0000 mg | ORAL_TABLET | Freq: Four times a day (QID) | ORAL | Status: DC | PRN
  Filled 2020-10-22: qty 1

## 2020-10-22 MED ORDER — OLANZAPINE 5 MG PO TBDP
20.0000 mg | ORAL_TABLET | Freq: Every day | ORAL | Status: DC
  Administered 2020-10-23 – 2020-10-24 (×2): 20 mg via ORAL
  Filled 2020-10-22 (×3): qty 4

## 2020-10-22 NOTE — BHH Suicide Risk Assessment (Addendum)
B[facility] INPATIENT:  Family/Significant Other Suicide Prevention Education  Suicide Prevention Education:  Education Completed; Crystal J. Williams Ehrenreich/mother/guardian [phone]), has been identified by the patient as the family member/significant other with whom the patient will be residing, and identified as the person(s) who will aid the patient in the event of a mental health crisis (suicidal ideations/suicide attempt).  With written consent from the patient, the family member/significant other has been provided the following suicide prevention education, prior to the and/or following the discharge of the patient.  The suicide prevention education provided includes the following:  Suicide risk factors  Suicide prevention and interventions  National Suicide Hotline telephone number  [facility] assessment telephone number  [facility] Emergency Assistance 911  [facility] and/or Residential Mobile Crisis Unit telephone number  Request made of family/significant other to:  Remove weapons (e.g., guns, rifles, knives), all items previously/currently identified as safety concern.    Remove drugs/medications (over-the-counter, prescriptions, illicit drugs), all items previously/currently identified as a safety concern.  The family member/significant other verbalizes understanding of the suicide prevention education information provided.  The family member/significant other agrees to remove the items of safety concern listed above.  Guardian stated that pt is often discharged prior to be at a comfortable therapeutic level and ends up trying to hurt himself. She shared that pt was discharged last time because he was better able to communicate, however, this time he is struggling with that. She shared several examples of pt poor decision making in connection with his mental health/delusions. Guardian stated that she does not feel pt is able to live independently and  expectation regarding long-term mental health hospitalization. CSW offered explanation that from previous experience admission to one of these facilities was unlikely unless pt was extremely violent while here at the hospital. However, CSW stated that applications/referrals would still take place. Guardian requested that provider call her. CSW stated that the provider would be made aware. No other concerns expressed. Contact ended without incident. Provider made aware of guardian request.    Glenis Smoker 10/22/2020, 3:57 PM

## 2020-10-22 NOTE — Progress Notes (Signed)
Patient having active conversation to himself with hand gestures and intermittent yelling. Patient does not pay attention while writer tried to talk to him.Patient came out of his room for drinks and meals.

## 2020-10-22 NOTE — Plan of Care (Signed)
Patient continues to have active conversation with yelling and screaming at times. Not compliant with medications.

## 2020-10-22 NOTE — Progress Notes (Signed)
Recreation Therapy Notes  Date: 10/22/2020  Time: 9:30 am   Location: Craft room     Behavioral response: N/A   Intervention Topic: Social-Skills   Discussion/Intervention: Patient did not attend group.   Clinical Observations/Feedback:  Patient did not attend group.   Shaverence Outlaw LRT/CTRS        Shaverence  Outlaw 10/22/2020 11:48 AM

## 2020-10-22 NOTE — Progress Notes (Addendum)
[facility] MD Progress Note  10/22/2020 10:07 AM Jackson [name]  MRN:  [id number]   CC: Follow-up for schizoaffective disorder, bipolar type  Subjective:  Jackson [name] is a 35 year old male with a history of schizoaffective disorder brought to the emergency department on October 17, 2020 by law enforcement after he was found walking naked down the street. Overnight patient seen speaking to himself and responding to internal stimuli. He has been noncompliant with medications during this admission, ADLs impaired.   This morning patient seen one-on-one at bedside. He says "What?" when I first enter the room. However, he subsequently refuses to speak again during our interview. He alternates from looking at the wall to snapping  his fingers and glaring angrily at me. He refuses to answer when asked if he stopped taking his medications. He also refuses to answer if having suicidal ideataions, homicidal ideations, visual hallucinations, or auditory hallucinations. He does not participate in a review of systems.   Jackson [name] has a long-standing history of schizoaffective disorder, bipolar type. This condition is unlikely to improve without use of psychiatric medications, and his behavior will worsen without treatment. He often becomes a danger to himself and has complications with law enforcement when he is untreated. It is my opinion that patient requires non-emergent forced medications at this time. Will consult Dr. Toni Amend for a second opinion.   Principal Problem: Schizoaffective disorder, bipolar type (HCC) Diagnosis: Principal Problem:   Schizoaffective disorder, bipolar type (HCC) Active Problems:   Noncompliance  Total Time spent with patient: 20 minutes  Past Psychiatric History: See H&P  Past Medical History:  Past Medical History:  Diagnosis Date  . Bipolar 1 disorder (HCC)   . Schizoaffective disorder (HCC)    History reviewed. No pertinent surgical history. Family History: History  reviewed. No pertinent family history. Family Psychiatric  History: See H&P Social History:  Social History   Substance and Sexual Activity  Alcohol Use Not Currently     Social History   Substance and Sexual Activity  Drug Use Not Currently    Social History   Socioeconomic History  . Marital status: Single    Spouse name: Not on file  . Number of children: Not on file  . Years of education: Not on file  . Highest education level: Not on file  Occupational History  . Not on file  Tobacco Use  . Smoking status: Light Tobacco Smoker    Packs/day: 0.25    Types: Cigarettes  . Smokeless tobacco: Never Used  Vaping Use  . Vaping Use: Never used  Substance and Sexual Activity  . Alcohol use: Not Currently  . Drug use: Not Currently  . Sexual activity: Not on file  Other Topics Concern  . Not on file  Social History Narrative  . Not on file   Social Determinants of Health   Financial Resource Strain: Not on file  Food Insecurity: Not on file  Transportation Needs: Not on file  Physical Activity: Not on file  Stress: Not on file  Social Connections: Not on file   Additional Social History:                         Sleep: Poor  Appetite:  Fair  Current Medications: Current Facility-Administered Medications  Medication Dose Route Frequency Provider Last Rate Last Admin  . acetaminophen (TYLENOL) tablet 650 mg  650 mg Oral Q6H PRN [name], [name], MD      .  alum & mag hydroxide-simeth (MAALOX/MYLANTA) 200-200-20 MG/5ML suspension 30 mL  30 mL Oral Q4H PRN [name], [name], MD      . hydrOXYzine (ATARAX/VISTARIL) tablet 50 mg  50 mg Oral TID PRN [name], [name], MD      . lurasidone (LATUDA) tablet 20 mg  20 mg Oral Q breakfast [name], [name], MD      . magnesium hydroxide (MILK OF MAGNESIA) suspension 30 mL  30 mL Oral Daily PRN [name], [name], MD      . OLANZapine zydis (ZYPREXA) disintegrating tablet 10 mg  10 mg Oral QID PRN Reggie Pile, MD       Or   . OLANZapine (ZYPREXA) injection 10 mg  10 mg Intramuscular QID PRN Reggie Pile, MD      . OLANZapine zydis (ZYPREXA) disintegrating tablet 20 mg  20 mg Oral QHS [name], [name], MD        Lab Results: No results found for this or any previous visit (from the past 48 hour(s)).  Blood Alcohol level:  Lab Results  Component Value Date   ETH <10 10/17/2020   ETH <10 08/11/2020    Metabolic Disorder Labs: Lab Results  Component Value Date   HGBA1C 5.2 10/23/2018   MPG 102.54 10/23/2018   No results found for: PROLACTIN Lab Results  Component Value Date   CHOL 129 10/23/2018   TRIG 58 10/23/2018   HDL 50 10/23/2018   CHOLHDL 2.6 10/23/2018   VLDL 12 10/23/2018   LDLCALC 67 10/23/2018    Physical Findings: AIMS: Facial and Oral Movements Muscles of Facial Expression: None, normal Lips and Perioral Area: None, normal Jaw: None, normal Tongue: None, normal,Extremity Movements Upper (arms, wrists, hands, fingers): None, normal Lower (legs, knees, ankles, toes): None, normal, Trunk Movements Neck, shoulders, hips: None, normal, Overall Severity Severity of abnormal movements (highest score from questions above): None, normal Incapacitation due to abnormal movements: None, normal Patient's awareness of abnormal movements (rate only patient's report): No Awareness, Dental Status Current problems with teeth and/or dentures?: No Does patient usually wear dentures?: No  CIWA:    COWS:     Musculoskeletal: Strength & Muscle Tone: within normal limits Gait & Station: normal Patient leans: N/A  Psychiatric Specialty Exam:  Presentation  General Appearance: Disheveled  Eye Contact:Other (comment) (glaring)  Speech:Other (comment) (Mute)  Speech Volume:Other (comment) (Mute)  Handedness:Right   Mood and Affect  Mood:Angry  Affect:Congruent   Thought Process  Thought Processes:Other (comment) (Unable to assess)  Descriptions of Associations:-- (Unable to  assess)  Orientation:-- (Unable to assess. Does look up when I say his name)  Thought Content:Other (comment) (Unable to assess. Appears gaurded and paranoid)  History of Schizophrenia/Schizoaffective disorder:Yes  Duration of Psychotic Symptoms:Greater than six months  Hallucinations:Hallucinations: Other (comment) (Patient does not answer. However, overheard speaking to himself and laughing inapppropriately in his room)  Ideas of Reference:-- (Unable to assess, but appears paranoid)  Suicidal Thoughts:Suicidal Thoughts: -- (Unable to assess)  Homicidal Thoughts:Homicidal Thoughts: -- (Unable to assess)   Sensorium  Memory:Other (comment) (Unable to assess)  Judgment:Impaired  Insight:Lacking   Investment banker, operational (comment) (Unable to assess)  Attention Span:-- (Unable to assess)  Recall:-- (Unable to assess)  Fund of Knowledge:-- (Unable to assess)  Language:Poor   Psychomotor Activity  Psychomotor Activity:Psychomotor Activity: Decreased   Assets  Assets:Financial Resources/Insurance; Physical Health   Sleep  Sleep:Sleep: Poor    Physical Exam: Physical Exam ROS Blood pressure (!) 142/128, pulse Marland Kitchen)  107, temperature 97.6 F (36.4 C), temperature source Oral, resp. rate 14, height 6' (1.829 m), weight 113 kg, SpO2 99 %. Body mass index is 33.79 kg/m.   Treatment Plan Summary: Daily contact with patient to assess and evaluate symptoms and progress in treatment and Medication management 35 year old male with schizoaffective disorder, bipolar type. He was admitted for psychosis and bizarre behavior in context of medication non-compliance. While on the unit patient observed to be responding to internal stimuli, and is becoming increasingly hostile towards staff. At this time patient warrants non-emergent forced medication. Second opinion for request provided by Dr. Toni Amend. Will start Zyprexa 20 mg PO QHS, or Zyprexa 10 mg IM. Will also  continue PRNs for acute agitation. Hydralazine PRN for elevated blood pressure.   [name], MD 10/22/2020, 10:07 AM

## 2020-10-22 NOTE — Tx Team (Signed)
Interdisciplinary Treatment and Diagnostic Plan Update  10/22/2020 Time of Session: 9:00AM Jackson [name] MRN: [id number]  Principal Diagnosis: <principal problem not specified>  Secondary Diagnoses: Active Problems:   Schizoaffective disorder, bipolar type ([place])   Current Medications:  Current Facility-Administered Medications  Medication Dose Route Frequency Provider Last Rate Last Admin  . acetaminophen (TYLENOL) tablet 650 mg  650 mg Oral Q6H PRN [name], [name], MD      . alum & mag hydroxide-simeth (MAALOX/MYLANTA) 200-200-20 MG/5ML suspension 30 mL  30 mL Oral Q4H PRN [name], [name], MD      . hydrOXYzine (ATARAX/VISTARIL) tablet 50 mg  50 mg Oral TID PRN [name], [name], MD      . lurasidone (LATUDA) tablet 20 mg  20 mg Oral Q breakfast [name], [name], MD      . magnesium hydroxide (MILK OF MAGNESIA) suspension 30 mL  30 mL Oral Daily PRN [name], [name], MD      . OLANZapine zydis (ZYPREXA) disintegrating tablet 10 mg  10 mg Oral QID PRN Rulon Sera, MD       Or  . OLANZapine (ZYPREXA) injection 10 mg  10 mg Intramuscular QID PRN Rulon Sera, MD      . OLANZapine zydis (ZYPREXA) disintegrating tablet 20 mg  20 mg Oral QHS [name], Madie Reno, MD       PTA Medications: Medications Prior to Admission  Medication Sig Dispense Refill Last Dose  . lurasidone (LATUDA) 20 MG TABS tablet Take 1 tablet (20 mg total) by mouth daily with supper. (Patient not taking: Reported on 10/17/2020) 30 tablet 1   . OLANZapine (ZYPREXA) 20 MG tablet Take 1 tablet (20 mg total) by mouth at bedtime. (Patient not taking: Reported on 10/17/2020) 30 tablet 1     Patient Stressors: Medication change or noncompliance  Patient Strengths: Curator fund of knowledge Supportive family/friends  Treatment Modalities: Medication Management, Group therapy, Case management,  1 to 1 session with clinician, Psychoeducation, Recreational therapy.   Physician Treatment Plan for Primary  Diagnosis: <principal problem not specified> Long Term Goal(s): Improvement in symptoms so as ready for discharge Improvement in symptoms so as ready for discharge   Short Term Goals:    Medication Management: Evaluate patient's response, side effects, and tolerance of medication regimen.  Therapeutic Interventions: 1 to 1 Morell, Unit Group [place] and Medication administration.  Evaluation of Outcomes: Not Met  Physician Treatment Plan for Secondary Diagnosis: Active Problems:   Schizoaffective disorder, bipolar type ([place])  Long Term Goal(s): Improvement in symptoms so as ready for discharge Improvement in symptoms so as ready for discharge   Short Term Goals:       Medication Management: Evaluate patient's response, side effects, and tolerance of medication regimen.  Therapeutic Interventions: 1 to 1 Delph, Unit Group [place] and Medication administration.  Evaluation of Outcomes: Not Met   RN Treatment Plan for Primary Diagnosis: <principal problem not specified> Long Term Goal(s): Knowledge of disease and therapeutic regimen to maintain health will improve  Short Term Goals: Ability to remain free from injury will improve, Ability to verbalize frustration and anger appropriately will improve, Ability to demonstrate self-control, Ability to participate in decision making will improve, Ability to verbalize feelings will improve, Ability to identify and develop effective coping behaviors will improve and Compliance with prescribed medications will improve  Medication Management: RN will administer medications as ordered by provider, will assess and evaluate patient's response and provide education to patient for prescribed medication. RN  will report any adverse and/or side effects to prescribing provider.  Therapeutic Interventions: 1 on 1 counseling Rawl, Psychoeducation, Medication administration, Evaluate responses to treatment, Monitor vital signs and CBGs as  ordered, Perform/monitor CIWA, COWS, AIMS and Fall Risk screenings as ordered, Perform wound care treatments as ordered.  Evaluation of Outcomes: Not Met   LCSW Treatment Plan for Primary Diagnosis: <principal problem not specified> Long Term Goal(s): Safe transition to appropriate next level of care at discharge, Engage patient in therapeutic group addressing interpersonal concerns.  Short Term Goals: Engage patient in aftercare planning with referrals and resources, Increase ability to appropriately verbalize feelings, Increase emotional regulation, Facilitate acceptance of mental health diagnosis and concerns, Identify triggers associated with mental health/substance abuse issues and Increase skills for wellness and recovery  Therapeutic Interventions: Assess for all discharge needs, 1 to 1 time with Social worker, Explore available resources and support systems, Assess for adequacy in community support network, Educate family and significant other(s) on suicide prevention, Complete Psychosocial Assessment, Interpersonal group therapy.  Evaluation of Outcomes: Not Met   Progress in Treatment: Attending groups: No. Participating in groups: No. Taking medication as prescribed: No. Toleration medication: No. Family/Significant other contact made: No, will contact:  CSW will contact guardian. Patient understands diagnosis: Yes. Discussing patient identified problems/goals with staff: No. Medical problems stabilized or resolved: Yes. Denies suicidal/homicidal ideation: Yes. Issues/concerns per patient self-inventory: No. Other: None.  New problem(s) identified: No, Describe:  None.  New Short Term/Long Term Goal(s): elimination of symptoms of psychosis, medication management for mood stabilization; elimination of SI thoughts; development of comprehensive mental wellness plan.  Patient Goals: Pt declined to participate in treatment team meeting despite invitation by nursing staff.     Discharge Plan or Barriers: CSW will assist with development of an appropriate discharge/aftercare plan.  Reason for Continuation of Hospitalization: Aggression Delusions  Hallucinations Medication stabilization  Estimated Length of Stay: 1-7 days  Attendees: Patient: Jackson [name] 10/22/2020 9:06 AM  Physician: Selina Cooley, MD 10/22/2020 9:06 AM  Nursing: West Pugh, RN 10/22/2020 9:06 AM  RN Care Manager: 10/22/2020 9:06 AM  Social Worker: Chalmers Guest. Guerry Bruin, MSW, [place], [place] 10/22/2020 9:06 AM  Recreational Therapist: Devin Going, LRT 10/22/2020 9:06 AM  Other: Assunta Curtis, MSW, LCSW 10/22/2020 9:06 AM  Other: Kiva Martinique, MSW, LCSW-A 10/22/2020 9:06 AM  Other:  10/22/2020 9:06 AM    Scribe for Treatment Team: Shirl Harris, LCSW 10/22/2020 9:06 AM

## 2020-10-22 NOTE — Progress Notes (Signed)
Patient is pleasant and cooperative. He has been less restless with a decrease in the pacing that he has been known to do.  He has been isolative to his room most of the shift and has continues to be naked while in his room.  He denies SI  HI  AVH depression anxiety and pain at this encounter.  Although he denies all he is often seen talking to himself and responding to internal stimuli. He continues to refuse his medications. He will continue to be monitored with 15 minute safety checks and was encouraged to come to staff with any concerns.    Cleo Butler-Nicholson, LPN

## 2020-10-22 NOTE — B[facility] Group Notes (Signed)
LCSW Group Therapy Note   10/22/2020 2:07 PM  Type of Therapy and Topic:  Group Therapy:  Overcoming Obstacles   Participation Level:  Did Not Attend   Description of Group:    In this group patients will be encouraged to explore what they see as obstacles to their own wellness and recovery. They will be guided to discuss their thoughts, feelings, and behaviors related to these obstacles. The group will process together ways to cope with barriers, with attention given to specific choices patients can make. Each patient will be challenged to identify changes they are motivated to make in order to overcome their obstacles. This group will be process-oriented, with patients participating in exploration of their own experiences as well as giving and receiving support and challenge from other group members.   Therapeutic Goals: 1. Patient will identify personal and current obstacles as they relate to admission. 2. Patient will identify barriers that currently interfere with their wellness or overcoming obstacles.  3. Patient will identify feelings, thought process and behaviors related to these barriers. 4. Patient will identify two changes they are willing to make to overcome these obstacles:      Summary of Patient Progress X    Therapeutic Modalities:   Cognitive Behavioral Therapy Solution Focused Therapy Motivational Interviewing Relapse Prevention Therapy  Jisela Merlino R. Harrington Jobe, MSW, LCSW, LCAS 10/22/2020 2:07 PM    

## 2020-10-22 NOTE — Progress Notes (Signed)
Patient pacing in the hallway burst out into laughter at times. Isolates in his room. When medication offered patient shows " out " by hand gestures. No aggressive behaviors noted at this time.

## 2020-10-22 NOTE — BHH Counselor (Addendum)
CSW contacted [facility] [phone]) to inquire regarding admissions. CSW asked if they accepted people who live in [place] but have [place] Tour manager) Medicaid. CSW was informed that they only cover the [place] part of the state and that the other state hospital in pt's area would be the place to start. No other concerns expressed. Contact ended without incident.   Vilma Meckel. Algis Greenhouse, MSW, LCSW, LCAS 10/22/2020 4:32 PM

## 2020-10-22 NOTE — Progress Notes (Signed)
Contacted Crystal Carollee Leitz, legal guardian, [phone]. She requests that we reach out to both [facility] and [facility] to see if we can transfer him to a longer term hospital. She also provides consent for non-emergent forced medications. Updated on plan to start with Zyprexa PO or IM to help stabilize patient. Social work team aware of request, and will be filling out form for [facility]. Also going to reach out to [facility] to see if they will accept someone from our area/with his current medicaid

## 2020-10-22 NOTE — Progress Notes (Signed)
[facility] Second Physician Opinion Progress Note for Medication Administration to Non-consenting Patients (For Involuntarily Committed Patients)  Patient: Jackson [name] Date of Birth: [id number] MRN: [id number]  Reason for the Medication: The patient, without the benefit of the specific treatment measure, is incapable of participating in any available treatment plan that will give the patient a realistic opportunity of improving the patient's condition. There is, without the benefit of the specific treatment measure, a significant possibility that the patient will harm self or others before improvement of the patient's condition is realized.  Consideration of Side Effects: Consideration of the side effects related to the medication plan has been given.  Rationale for Medication Administration: Patient with known and well described psychotic disorder is displaying belligerent and hostile behavior which is disruptive to the unit and causing fear among staff and patients.  Patient not likely to improve from his psychotic and inappropriately agitated state without medication.  Currently refusing medicine based on poor insight.    Mordecai Rasmussen, MD 10/22/20  10:15 AM   This documentation is good for (7) seven days from the date of the MD signature. New documentation must be completed every seven (7) days with detailed justification in the medical record if the patient requires continued non-emergent administration of psychotropic medications.

## 2020-10-23 DIAGNOSIS — F250 Schizoaffective disorder, bipolar type: Secondary | ICD-10-CM | POA: Diagnosis not present

## 2020-10-23 MED ORDER — ZIPRASIDONE MESYLATE 20 MG IM SOLR
20.0000 mg | Freq: Once | INTRAMUSCULAR | Status: DC
  Filled 2020-10-23: qty 20

## 2020-10-23 NOTE — Progress Notes (Addendum)
Patient has been making loud animal like sounds at the start of the shift. Not communicating with staff. Snapping his fingers. Appears angry and internally preoccupied. Refused to take po medication at HS. He complied with IM Zyprexa with security standing by for support. He tensed up and started shaking while getting the injection, but allowed it, and said to this author "you have to remove your hands because autopilot has been activated". Later during the shift, he was found lying on the floor in the bathroom. He had taken his pillow and bedding off of his bed and was sleeping on the bathroom floor. He appeared fearful and refused to get back in bed and asked for more blankets. No mention of any suicidal or homicidal ideation. Was quiet after getting the injection.

## 2020-10-23 NOTE — Progress Notes (Signed)
Recreation Therapy Notes  Date: 10/23/2020   Time: 9:30 am               Location: [address]   Behavioral response: Inappropriate, redirection needed   Intervention Topic: Leisure    Discussion/Intervention:  Group content today was focused on leisure. The group defined what leisure is and some positive leisure activities they participate in. Individuals identified the difference between good and bad leisure. Participants expressed how they feel after participating in the leisure of their choice. The group discussed how they go about picking a leisure activity and if others are involved in their leisure activities. The patient stated how many leisure activities they have to choose from and reasons why it is important to have leisure time. Individuals participated in the intervention "Exploration of Leisure" where they had a chance to identify new leisure activities as well as benefits of leisure. Clinical Observations/Feedback: Patient came to group and began to yell, patient was redirected by Clinical research associate. Patient began to aggressively snap, dance and continue to display many odd behaviors. Individual continued to yell and scream and disturb peers and patients on other floors; patients was told to return to his room and the court yard was closed.    Shaverence Outlaw LRT/CTRS         Shaverence  Outlaw 10/23/2020 11:43 AM

## 2020-10-23 NOTE — Progress Notes (Signed)
Patient pacing in the hallway yelling and screaming.Patient appears angry and internally preoccupied and makes aggressive gestures. Not communicating with staff. Refused medications.Patient took IM Zyprexa with stand by support of security. Appreciated patient for his cooperation.

## 2020-10-23 NOTE — BHH Group Notes (Signed)
LCSW Group Therapy Note  10/23/2020 1:54 PM  Type of Therapy/Topic:  Group Therapy:  Feelings about Diagnosis  Participation Level:  Did Not Attend   Description of Group:   This group will allow patients to explore their thoughts and feelings about diagnoses they have received. Patients will be guided to explore their level of understanding and acceptance of these diagnoses. Facilitator will encourage patients to process their thoughts and feelings about the reactions of others to their diagnosis and will guide patients in identifying ways to discuss their diagnosis with significant others in their lives. This group will be process-oriented, with patients participating in exploration of their own experiences, giving and receiving support, and processing challenge from other group members.   Therapeutic Goals: 1. Patient will demonstrate understanding of diagnosis as evidenced by identifying two or more symptoms of the disorder 2. Patient will be able to express two feelings regarding the diagnosis 3. Patient will demonstrate their ability to communicate their needs through discussion and/or role play  Summary of Patient Progress: X  Therapeutic Modalities:   Cognitive Behavioral Therapy Brief Therapy Feelings Identification   Kam Rahimi, MSW, LCSW 10/23/2020 1:54 PM   

## 2020-10-23 NOTE — Plan of Care (Signed)
Patient had a nap after the medication.Patient appears little more receptive and states " I am not going to hurt anyone. I don't need any medicine." Less yelling and pacing noted in the afternoon. Appetite and energy level good. Support and encouragement given.

## 2020-10-23 NOTE — Progress Notes (Signed)
[facility] MD Progress Note  10/23/2020 11:31 AM Jackson [name]  MRN:  [id number]   CC: "i'm just living in metaphysics"   Subjective:  Jackson [name] is a 35 year old male with a history of schizoaffective disorder brought to the emergency department on October 17, 2020 by law enforcement after he was found walking naked down the street. Overnight patient required non-emergent forced medications, this morning non-compliant with scheduled medications.  ADLs remain impaired.   This morning patient overheard yelling loudly and angerly in his bathroom, and sounded as if banging on the walls. He is seen with security present in the background for safety. He does come out and politely greet me this morning. He denies making any sounds in the bathroom, and notes he was just feeling happy. He states that he is living in the metaphysics. When asked what this means, he notes he can be many places at once. He denies he has any psychiatric issues. When asked why he was brought to the hospital he states it is for vacation. He does admit to not taking his medications at discharge stating he feels they are all poison. He then asks if he can go home. Explained that he does have a psychiatric illness that requires medications. Informed him the fastest path to discharge would be medication compliance particularly with a long-acting injectable, and agreeing to meet with his ACT Team. Patient notes that he will just continue to live here as he will not take any medications. This interview was terminated without incident or any aggressive behaviors. However, he was later noted to be pacing the hallways yelling, screaming, and making aggressive gestures toward staff. He refused to speak to staff at this point, and could not be verbally redirected to stop yelling. PRNs for acute agitation given.    Principal Problem: Schizoaffective disorder, bipolar type (HCC) Diagnosis: Principal Problem:   Schizoaffective disorder, bipolar type  (HCC) Active Problems:   Noncompliance  Total Time spent with patient: 30 minutes  Past Psychiatric History: See H&P  Past Medical History:  Past Medical History:  Diagnosis Date  . Bipolar 1 disorder (HCC)   . Schizoaffective disorder (HCC)    History reviewed. No pertinent surgical history. Family History: History reviewed. No pertinent family history. Family Psychiatric  History: See H&P Social History:  Social History   Substance and Sexual Activity  Alcohol Use Not Currently     Social History   Substance and Sexual Activity  Drug Use Not Currently    Social History   Socioeconomic History  . Marital status: Single    Spouse name: Not on file  . Number of children: Not on file  . Years of education: Not on file  . Highest education level: Not on file  Occupational History  . Not on file  Tobacco Use  . Smoking status: Light Tobacco Smoker    Packs/day: 0.25    Types: Cigarettes  . Smokeless tobacco: Never Used  Vaping Use  . Vaping Use: Never used  Substance and Sexual Activity  . Alcohol use: Not Currently  . Drug use: Not Currently  . Sexual activity: Not on file  Other Topics Concern  . Not on file  Social History Narrative  . Not on file   Social Determinants of Health   Financial Resource Strain: Not on file  Food Insecurity: Not on file  Transportation Needs: Not on file  Physical Activity: Not on file  Stress: Not on file  Social Connections: Not on file  Additional Social History:     Sleep: Fair  Appetite:  Fair  Current Medications: Current Facility-Administered Medications  Medication Dose Route Frequency Provider Last Rate Last Admin  . acetaminophen (TYLENOL) tablet 650 mg  650 mg Oral Q6H PRN [name], [name], MD      . alum & mag hydroxide-simeth (MAALOX/MYLANTA) 200-200-20 MG/5ML suspension 30 mL  30 mL Oral Q4H PRN [name], [name], MD      . hydrALAZINE (APRESOLINE) tablet 10 mg  10 mg Oral Q6H PRN [name], MD       . hydrOXYzine (ATARAX/VISTARIL) tablet 50 mg  50 mg Oral TID PRN [name], [name], MD      . lurasidone (LATUDA) tablet 20 mg  20 mg Oral Q breakfast [name], [name], MD      . magnesium hydroxide (MILK OF MAGNESIA) suspension 30 mL  30 mL Oral Daily PRN [name], [name], MD      . OLANZapine zydis (ZYPREXA) disintegrating tablet 10 mg  10 mg Oral QID PRN Reggie Pile, MD       Or  . OLANZapine (ZYPREXA) injection 10 mg  10 mg Intramuscular QID PRN Reggie Pile, MD   10 mg at 10/23/20 1040  . OLANZapine zydis (ZYPREXA) disintegrating tablet 20 mg  20 mg Oral QHS [name], MD       Or  . OLANZapine (ZYPREXA) injection 10 mg  10 mg Intramuscular QHS [name], MD   10 mg at 10/22/20 2139  . ziprasidone (GEODON) injection 20 mg  20 mg Intramuscular Once [name], MD        Lab Results: No results found for this or any previous visit (from the past 48 hour(s)).  Blood Alcohol level:  Lab Results  Component Value Date   ETH <10 10/17/2020   ETH <10 08/11/2020    Metabolic Disorder Labs: Lab Results  Component Value Date   HGBA1C 5.2 10/23/2018   MPG 102.54 10/23/2018   No results found for: PROLACTIN Lab Results  Component Value Date   CHOL 129 10/23/2018   TRIG 58 10/23/2018   HDL 50 10/23/2018   CHOLHDL 2.6 10/23/2018   VLDL 12 10/23/2018   LDLCALC 67 10/23/2018    Physical Findings: AIMS: Facial and Oral Movements Muscles of Facial Expression: None, normal Lips and Perioral Area: None, normal Jaw: None, normal Tongue: None, normal,Extremity Movements Upper (arms, wrists, hands, fingers): None, normal Lower (legs, knees, ankles, toes): None, normal, Trunk Movements Neck, shoulders, hips: None, normal, Overall Severity Severity of abnormal movements (highest score from questions above): None, normal Incapacitation due to abnormal movements: None, normal Patient's awareness of abnormal movements (rate only patient's report): No Awareness, Dental  Status Current problems with teeth and/or dentures?: No Does patient usually wear dentures?: No  CIWA:    COWS:     Musculoskeletal: Strength & Muscle Tone: within normal limits Gait & Station: normal Patient leans: N/A  Psychiatric Specialty Exam:  Presentation  General Appearance: Disheveled  Eye Contact:Fleeting  Speech:Pressured  Speech Volume:Increased  Handedness:Right   Mood and Affect  Mood:Irritable; Angry  Affect:Congruent   Thought Process  Thought Processes:Disorganized  Descriptions of Associations:Loose  Orientation:Full (Time, Place and Person)  Thought Content:Delusions; Paranoid Ideation; Tangential  History of Schizophrenia/Schizoaffective disorder:Yes  Duration of Psychotic Symptoms:Greater than six months  Hallucinations:Hallucinations: Auditory  Ideas of Reference:Delusions  Suicidal Thoughts:Suicidal Thoughts: No  Homicidal Thoughts:Homicidal Thoughts: No   Sensorium  Memory:Immediate Fair; Recent Poor; Remote Poor  Judgment:Impaired  Insight:Lacking   Executive Functions  Concentration:Poor  Attention Span:Poor  Recall:Poor  Fund of Knowledge:Fair  Language:Fair   Psychomotor Activity  Psychomotor Activity:Psychomotor Activity: Increased   Assets  Assets:Financial Resources/Insurance; Housing; Resilience   Sleep  Sleep:Sleep: Fair Number of Hours of Sleep: 6.25    Physical Exam: Physical Exam  ROS  Blood pressure (!) 142/128, pulse (!) 107, temperature 97.6 F (36.4 C), temperature source Oral, resp. rate 14, height 6' (1.829 m), weight 113 kg, SpO2 99 %. Body mass index is 33.79 kg/m.   Treatment Plan Summary: Daily contact with patient to assess and evaluate symptoms and progress in treatment and Medication management 35 year old male with schizoaffective disorder, bipolar type. He was admitted for psychosis and bizarre behavior in context of medication non-compliance. While on the unit patient  observed to be responding to internal stimuli, and is becoming increasingly hostile towards staff. Will continue non-emergent forced medications with Zyprexa 20 mg PO QHS, or Zyprexa 10 mg IM. Will also continue PRNs for acute agitation. Hydralazine PRN for elevated blood pressure. Continue to offer Latuda 20 mg daily with breakfast.   [name], MD 10/23/2020, 11:31 AM

## 2020-10-23 NOTE — Progress Notes (Signed)
Patient refused his morning medication, stating "I'm ok". MD notified.

## 2020-10-23 NOTE — Progress Notes (Signed)
Patient presents responding to internal stimuli. Pt delusional upon assessment denying SI/HI/AVH. Pt stated to this writer that he was here because he left something last time he was here and he needed to get it. Pt was unclear on what he left here. Pt stated his voice was the voice of God and was yelling in the hallway, pt was redirectable. Pt became irritable during medication administration and was yelling and cussing out staff and security. Pt did take PO medications, see MAR. Patient has been pacing the unit since taking his medications. Patient given education, support, and encouragement to be active in his treatment plan. Pt being monitored Q 15 minutes for safety per unit protocol. Pt remains safe on the unit.

## 2020-10-23 NOTE — Plan of Care (Signed)
Patient presents actively psychotic and delusional   Problem: Education: Goal: Will be free of psychotic symptoms Outcome: Not Progressing

## 2020-10-23 NOTE — Progress Notes (Signed)
Recreation Therapy Notes  INPATIENT RECREATION THERAPY ASSESSMENT  Patient Details Name: Jackson [name] MRN: [id number] DOB: 12/05/1985 Today's Date: 10/23/2020       Information Obtained From: Patient (Patient notes everything is the same from last time)  Able to Participate in Assessment/Interview: Yes  Patient Presentation: Responsive  Reason for Admission (Per Patient): Active Symptoms  Patient Stressors: Family  Coping Skills:   Read,Prayer,Music,Write  Leisure Interests (2+):  Music - Write music,Music - Listen,Games - Video games,Individual - Reading,Individual - Writing  Frequency of Recreation/Participation: Monthly  Awareness of Community Resources:     [employer]:     Current Use:    If no, Barriers?:    Expressed Interest in [employer] Information:    [place] of Residence:  Film/video editor  Patient Main Form of Transportation: Set designer  Patient Strengths:  Music  Patient Identified Areas of Improvement:  N/A  Patient Goal for Hospitalization:  To go home and get my independence back  Current SI (including self-harm):  No  Current HI:  No  Current AVH: No  Staff Intervention Plan: Group Attendance,Collaborate with Interdisciplinary Treatment Team  Consent to Intern Participation: N/A  Jackson  [name] 10/23/2020, 1:35 PM

## 2020-10-23 NOTE — Plan of Care (Signed)
  Problem: Activity: Goal: Will verbalize the importance of balancing activity with adequate rest periods Outcome: Progressing   Problem: Education: Goal: Will be free of psychotic symptoms Outcome: Progressing Goal: Knowledge of the prescribed therapeutic regimen will improve Outcome: Progressing   Problem: Coping: Goal: Coping ability will improve Outcome: Progressing Goal: Will verbalize feelings Outcome: Progressing   Problem: Health Behavior/Discharge Planning: Goal: Compliance with prescribed medication regimen will improve Outcome: Progressing   Problem: Nutritional: Goal: Ability to achieve adequate nutritional intake will improve Outcome: Progressing   Problem: Role Relationship: Goal: Ability to communicate needs accurately will improve Outcome: Progressing Goal: Ability to interact with others will improve Outcome: Progressing   Problem: Safety: Goal: Ability to redirect hostility and anger into socially appropriate behaviors will improve Outcome: Progressing Goal: Ability to remain free from injury will improve Outcome: Progressing   Problem: Self-Care: Goal: Ability to participate in self-care as condition permits will improve Outcome: Progressing   Problem: Self-Concept: Goal: Will verbalize positive feelings about self Outcome: Progressing   Problem: Education: Goal: Knowledge of [place] [facility] Education information/materials will improve Outcome: Progressing Goal: Emotional status will improve Outcome: Progressing Goal: Mental status will improve Outcome: Progressing Goal: Verbalization of understanding the information provided will improve Outcome: Progressing   Problem: Activity: Goal: Interest or engagement in activities will improve Outcome: Progressing Goal: Sleeping patterns will improve Outcome: Progressing   Problem: Coping: Goal: Ability to verbalize frustrations and anger appropriately will improve Outcome:  Progressing Goal: Ability to demonstrate self-control will improve Outcome: Progressing   Problem: Health Behavior/Discharge Planning: Goal: Identification of resources available to assist in meeting health care needs will improve Outcome: Progressing Goal: Compliance with treatment plan for underlying cause of condition will improve Outcome: Progressing   Problem: Physical Regulation: Goal: Ability to maintain clinical measurements within normal limits will improve Outcome: Progressing   Problem: Safety: Goal: Periods of time without injury will increase Outcome: Progressing   Problem: Education: Goal: Ability to state activities that reduce stress will improve Outcome: Progressing   Problem: Coping: Goal: Ability to identify and develop effective coping behavior will improve Outcome: Progressing   Problem: Self-Concept: Goal: Ability to identify factors that promote anxiety will improve Outcome: Progressing Goal: Level of anxiety will decrease Outcome: Progressing Goal: Ability to modify response to factors that promote anxiety will improve Outcome: Progressing

## 2020-10-23 NOTE — [facility] Counselor (Addendum)
CSW faxed application to [facility] at [phone].  CSW called [facility] at [phone] and spoke with Selena Batten regarding receipt of pt referral information and completed phone screening. Selena Batten stated that nurse would review paperwork and then give CSW a call. No other concerns expressed. Contact ended without incident.  Vilma Meckel. Algis Greenhouse, MSW, LCSW, LCAS 10/23/2020 4:22 PM

## 2020-10-24 DIAGNOSIS — F250 Schizoaffective disorder, bipolar type: Secondary | ICD-10-CM | POA: Diagnosis not present

## 2020-10-24 NOTE — Progress Notes (Signed)
[facility] MD Progress Note  10/24/2020 12:19 PM Jackson [name]  MRN:  [id number]   CC: "Let me go. "   Subjective:  Mr. Deford is a 35 year old male with a history of schizoaffective disorder brought to the emergency department on October 17, 2020 by law enforcement after he was found walking naked down the street. Overnight patient did take oral medications with presence of security, this morning non-compliant with scheduled medications.  ADLs remain impaired.   This morning patient yelling loudly in the halls, angry, pounding on door to office in the unit. He continues to be overheard responding to internal stimuli in his room. He also continues to make odd gesture by flicking his hand around, and snapping his fingers. Patient angered by lack of food, and use of injections. He continues to state he has not done anything to anyone, and has been simply sitting in his room like a slave. He does not respond well when brining up aggression towards staff and threats to fight security guards yesterday. He also asks why he needs medications in general. On attempts to explain his diagnosis, and indication for medication he becomes increasingly agitated. He then storms off, and yells at provider to get away from his room. Conversation terminated for safety.    Principal Problem: Schizoaffective disorder, bipolar type (HCC) Diagnosis: Principal Problem:   Schizoaffective disorder, bipolar type (HCC) Active Problems:   Noncompliance  Total Time spent with patient: 30 minutes  Past Psychiatric History: See H&P  Past Medical History:  Past Medical History:  Diagnosis Date  . Bipolar 1 disorder (HCC)   . Schizoaffective disorder (HCC)    History reviewed. No pertinent surgical history. Family History: History reviewed. No pertinent family history. Family Psychiatric  History: See H&P Social History:  Social History   Substance and Sexual Activity  Alcohol Use Not Currently     Social History    Substance and Sexual Activity  Drug Use Not Currently    Social History   Socioeconomic History  . Marital status: Single    Spouse name: Not on file  . Number of children: Not on file  . Years of education: Not on file  . Highest education level: Not on file  Occupational History  . Not on file  Tobacco Use  . Smoking status: Light Tobacco Smoker    Packs/day: 0.25    Types: Cigarettes  . Smokeless tobacco: Never Used  Vaping Use  . Vaping Use: Never used  Substance and Sexual Activity  . Alcohol use: Not Currently  . Drug use: Not Currently  . Sexual activity: Not on file  Other Topics Concern  . Not on file  Social History Narrative  . Not on file   Social Determinants of Health   Financial Resource Strain: Not on file  Food Insecurity: Not on file  Transportation Needs: Not on file  Physical Activity: Not on file  Stress: Not on file  Social Connections: Not on file   Additional Social History:     Sleep: Fair  Appetite:  Fair  Current Medications: Current Facility-Administered Medications  Medication Dose Route Frequency Provider Last Rate Last Admin  . acetaminophen (TYLENOL) tablet 650 mg  650 mg Oral Q6H PRN [name], [name], MD      . alum & mag hydroxide-simeth (MAALOX/MYLANTA) 200-200-20 MG/5ML suspension 30 mL  30 mL Oral Q4H PRN [name], [name], MD      . hydrALAZINE (APRESOLINE) tablet 10 mg  10 mg Oral  Q6H PRN [name], MD      . hydrOXYzine (ATARAX/VISTARIL) tablet 50 mg  50 mg Oral TID PRN [name], [name], MD      . lurasidone (LATUDA) tablet 20 mg  20 mg Oral Q breakfast [name], [name], MD      . magnesium hydroxide (MILK OF MAGNESIA) suspension 30 mL  30 mL Oral Daily PRN [name], [name], MD      . OLANZapine zydis (ZYPREXA) disintegrating tablet 10 mg  10 mg Oral QID PRN Reggie Pile, MD       Or  . OLANZapine (ZYPREXA) injection 10 mg  10 mg Intramuscular QID PRN Reggie Pile, MD   10 mg at 10/23/20 1040  . OLANZapine zydis  (ZYPREXA) disintegrating tablet 20 mg  20 mg Oral QHS [name], MD   20 mg at 10/23/20 2132   Or  . OLANZapine (ZYPREXA) injection 10 mg  10 mg Intramuscular QHS [name], MD   10 mg at 10/22/20 2139    Lab Results: No results found for this or any previous visit (from the past 48 hour(s)).  Blood Alcohol level:  Lab Results  Component Value Date   ETH <10 10/17/2020   ETH <10 08/11/2020    Metabolic Disorder Labs: Lab Results  Component Value Date   HGBA1C 5.2 10/23/2018   MPG 102.54 10/23/2018   No results found for: PROLACTIN Lab Results  Component Value Date   CHOL 129 10/23/2018   TRIG 58 10/23/2018   HDL 50 10/23/2018   CHOLHDL 2.6 10/23/2018   VLDL 12 10/23/2018   LDLCALC 67 10/23/2018    Physical Findings: AIMS: Facial and Oral Movements Muscles of Facial Expression: None, normal Lips and Perioral Area: None, normal Jaw: None, normal Tongue: None, normal,Extremity Movements Upper (arms, wrists, hands, fingers): None, normal Lower (legs, knees, ankles, toes): None, normal, Trunk Movements Neck, shoulders, hips: None, normal, Overall Severity Severity of abnormal movements (highest score from questions above): None, normal Incapacitation due to abnormal movements: None, normal Patient's awareness of abnormal movements (rate only patient's report): No Awareness, Dental Status Current problems with teeth and/or dentures?: No Does patient usually wear dentures?: No  CIWA:    COWS:     Musculoskeletal: Strength & Muscle Tone: within normal limits Gait & Station: normal Patient leans: N/A  Psychiatric Specialty Exam:  Presentation  General Appearance: Disheveled  Eye Contact:Fleeting  Speech:Pressured  Speech Volume:Increased  Handedness:Right   Mood and Affect  Mood:Irritable; Angry  Affect:Congruent   Thought Process  Thought Processes:Disorganized  Descriptions of Associations:Loose  Orientation:Full (Time, Place and  Person)  Thought Content:Delusions; Paranoid Ideation; Tangential  History of Schizophrenia/Schizoaffective disorder:Yes  Duration of Psychotic Symptoms:Greater than six months  Hallucinations:Hallucinations: Auditory  Ideas of Reference:Delusions  Suicidal Thoughts:Suicidal Thoughts: No  Homicidal Thoughts:Homicidal Thoughts: No   Sensorium  Memory:Immediate Fair; Recent Poor; Remote Poor  Judgment:Impaired  Insight:Lacking   Executive Functions  Concentration:Poor  Attention Span:Poor  Recall:Poor  Fund of Knowledge:Fair  Language:Fair   Psychomotor Activity  Psychomotor Activity:Psychomotor Activity: Increased   Assets  Assets:Financial Resources/Insurance; Housing; Resilience   Sleep  Sleep:Sleep: Fair Number of Hours of Sleep: 5    Physical Exam: Physical Exam  ROS  Blood pressure (!) 142/128, pulse (!) 107, temperature 97.6 F (36.4 C), temperature source Oral, resp. rate 14, height 6' (1.829 m), weight 113 kg, SpO2 99 %. Body mass index is 33.79 kg/m.   Treatment Plan Summary: Daily contact with patient to  assess and evaluate symptoms and progress in treatment and Medication management 35 year old male with schizoaffective disorder, bipolar type. He was admitted for psychosis and bizarre behavior in context of medication non-compliance. While on the unit patient observed to be responding to internal stimuli, and is becoming increasingly hostile towards staff. Will continue non-emergent forced medications with Zyprexa 20 mg PO QHS, or Zyprexa 10 mg IM. Will also continue PRNs for acute agitation. Hydralazine PRN for elevated blood pressure. Continue to offer Latuda 20 mg daily with breakfast.   10/24/20 Psychiatric exam above reviewed and remains accurate. Assessment and plan above reviewed and updated.    [name], MD 10/24/2020, 12:19 PM

## 2020-10-24 NOTE — Plan of Care (Signed)
Patient presents psychotic and delusional   Problem: Education: Goal: Will be free of psychotic symptoms Outcome: Not Progressing

## 2020-10-24 NOTE — Progress Notes (Signed)
Patient woke up around 0500 because of another Pt being loud on the unit. Patient irritable and delusional. Patient stated that we are all puppets and we need to wake up. Pt pacing the unit

## 2020-10-24 NOTE — Progress Notes (Addendum)
Pt is alert and oriented to person, place, time and situation. Pt is loud, agitated, paces, yells and screams loudly, labile, then becomes tearful, at other times pt is demanding and attention seeking. Pt demands double portions. Pt is not easily verbally redirectable, and requires frequent redirections. Pt is disruptive in the milieu, banging on doors, slamming doors, posturing towards other patients. Pt is uncooperative, restless, hyperactive. Pt refused his morning scheduled medications, unit psychiatrist/MD notified about refusal of medication. Will continue to monitor pt per Q15 minute face checks and monitor for safety and progress.   Pt appears to be responding to internal stimuli, self talk noted. Pt denies suicidal and homicidal ideation, denies feelings of depression and anxiety. Will continue to monitor.

## 2020-10-24 NOTE — Progress Notes (Signed)
Patient presents less psychotic than last time this writer had this patient. Patient still presents with delusions, pacing the hall and jumping around while whistling. Patient compliant with medication administration tonight, check MAR. Patient given education, support, and encouragement to be active in his treatment plan. Patient being monitored Q 15 minutes for safety per unit protocol. Pt remains safe on the unit.

## 2020-10-24 NOTE — [facility] Group Notes (Signed)
LCSW Group Therapy Note  10/24/2020 2:12 PM  Type of Therapy/Topic:  Group Therapy:  Emotion Regulation  Participation Level:  Did Not Attend   Description of Group:   The purpose of this group is to assist patients in learning to regulate negative emotions and experience positive emotions. Patients will be guided to discuss ways in which they have been vulnerable to their negative emotions. These vulnerabilities will be juxtaposed with experiences of positive emotions or situations, and patients will be challenged to use positive emotions to combat negative ones. Special emphasis will be placed on coping with negative emotions in conflict situations, and patients will process healthy conflict resolution skills.  Therapeutic Goals: 1. Patient will identify two positive emotions or experiences to reflect on in order to balance out negative emotions 2. Patient will label two or more emotions that they find the most difficult to experience 3. Patient will demonstrate positive conflict resolution skills through discussion and/or role plays  Summary of Patient Progress: X  Therapeutic Modalities:   Cognitive Behavioral Therapy Feelings Identification Dialectical Behavioral Therapy  Reah Justo R. Samson Ralph, MSW, LCSW, LCAS 10/24/2020 2:12 PM   

## 2020-10-24 NOTE — Progress Notes (Signed)
Recreation Therapy Notes  Date: 10/24/2020  Time: 10:00 am   Location: Craft room     Behavioral response: N/A   Intervention Topic: Happiness    Discussion/Intervention: Patient did not attend group.   Clinical Observations/Feedback:  Patient did not attend group.   Shaverence Outlaw LRT/CTRS        Shaverence  Outlaw 10/24/2020 12:34 PM

## 2020-10-25 DIAGNOSIS — F250 Schizoaffective disorder, bipolar type: Secondary | ICD-10-CM | POA: Diagnosis not present

## 2020-10-25 MED ORDER — OLANZAPINE 10 MG IM SOLR: 10.0000 mg | Freq: Every day | INTRAMUSCULAR | Status: DC

## 2020-10-25 MED ORDER — OLANZAPINE 5 MG PO TBDP
25.0000 mg | ORAL_TABLET | Freq: Every day | ORAL | Status: DC
  Administered 2020-10-25 – 2020-10-29 (×5): 25 mg via ORAL
  Filled 2020-10-25 (×5): qty 5

## 2020-10-25 MED ORDER — OLANZAPINE 5 MG PO TBDP: 25.0000 mg | ORAL_TABLET | Freq: Every day | ORAL | Status: DC

## 2020-10-25 MED ORDER — OLANZAPINE 10 MG IM SOLR
15.0000 mg | Freq: Every day | INTRAMUSCULAR | Status: DC
  Administered 2020-10-30: 15 mg via INTRAMUSCULAR
  Filled 2020-10-25 (×2): qty 20

## 2020-10-25 NOTE — Progress Notes (Signed)
[facility] MD Progress Note  10/25/2020 1:57 PM Jackson [name]  MRN:  [id number]   CC: "You're poisoning me."   Subjective:  Jackson [name] is a 35 year old male with a history of schizoaffective disorder brought to the emergency department on October 17, 2020 by law enforcement after he was found walking naked down the street. Overnight patient compliant with medications, this morning non-compliant with scheduled medications.  ADLs remain impaired.   This morning patient is once again yelling, punching the air, and banging on door to office on the unit. He continues to ask why he is periodically getting shots of medications, and states that I am poisoning him. On explanation of behaviors that have led to PRN administration, he continues to insist that he has been lying quietly in his room the whole time. He continues to whistle oddly, snap his fingers, and flick his wrists. He appears to believe that this movements and sounds are transporting people through time and space as he states we are now in his house, then his hospital room, then back at his house despite standing still in the hallway during this conversation. He continues to deny visual and auditory hallucinations, but is clearly responding to internal stimuli. He is noted to be speaking to himself in the hallways and his bedroom, and continues to scream out loudly very frequently. He denies suicidal ideations or homicidal ideations. He continues to have zero insight into what led him to be hospitalized. He continues to vehemently deny his psychiatric diagnosis, or need for medication. He insists he just came to the hospital to pick up something he left, and to take a vacation. He continues to ask to go back home at this time. He is unwilling to listen to explanation of diagnosis, symptoms, or medications.   Principal Problem: Schizoaffective disorder, bipolar type (HCC) Diagnosis: Principal Problem:   Schizoaffective disorder, bipolar type  (HCC) Active Problems:   Noncompliance  Total Time spent with patient: 30 minutes  Past Psychiatric History: See H&P  Past Medical History:  Past Medical History:  Diagnosis Date  . Bipolar 1 disorder (HCC)   . Schizoaffective disorder (HCC)    History reviewed. No pertinent surgical history. Family History: History reviewed. No pertinent family history. Family Psychiatric  History: See H&P Social History:  Social History   Substance and Sexual Activity  Alcohol Use Not Currently     Social History   Substance and Sexual Activity  Drug Use Not Currently    Social History   Socioeconomic History  . Marital status: Single    Spouse name: Not on file  . Number of children: Not on file  . Years of education: Not on file  . Highest education level: Not on file  Occupational History  . Not on file  Tobacco Use  . Smoking status: Light Tobacco Smoker    Packs/day: 0.25    Types: Cigarettes  . Smokeless tobacco: Never Used  Vaping Use  . Vaping Use: Never used  Substance and Sexual Activity  . Alcohol use: Not Currently  . Drug use: Not Currently  . Sexual activity: Not on file  Other Topics Concern  . Not on file  Social History Narrative  . Not on file   Social Determinants of Health   Financial Resource Strain: Not on file  Food Insecurity: Not on file  Transportation Needs: Not on file  Physical Activity: Not on file  Stress: Not on file  Social Connections: Not on file  Additional Social History:     Sleep: Fair  Appetite:  Fair  Current Medications: Current Facility-Administered Medications  Medication Dose Route Frequency Provider Last Rate Last Admin  . acetaminophen (TYLENOL) tablet 650 mg  650 mg Oral Q6H PRN [name], [name], MD      . alum & mag hydroxide-simeth (MAALOX/MYLANTA) 200-200-20 MG/5ML suspension 30 mL  30 mL Oral Q4H PRN [name], [name], MD      . hydrALAZINE (APRESOLINE) tablet 10 mg  10 mg Oral Q6H PRN [name], MD       . hydrOXYzine (ATARAX/VISTARIL) tablet 50 mg  50 mg Oral TID PRN [name], [name], MD      . magnesium hydroxide (MILK OF MAGNESIA) suspension 30 mL  30 mL Oral Daily PRN [name], [name], MD      . OLANZapine zydis (ZYPREXA) disintegrating tablet 10 mg  10 mg Oral QID PRN Reggie Pile, MD       Or  . OLANZapine (ZYPREXA) injection 10 mg  10 mg Intramuscular QID PRN Reggie Pile, MD   10 mg at 10/23/20 1040  . OLANZapine zydis (ZYPREXA) disintegrating tablet 25 mg  25 mg Oral QHS [name], MD       Or  . OLANZapine (ZYPREXA) injection 10 mg  10 mg Intramuscular QHS [name], MD        Lab Results: No results found for this or any previous visit (from the past 48 hour(s)).  Blood Alcohol level:  Lab Results  Component Value Date   ETH <10 10/17/2020   ETH <10 08/11/2020    Metabolic Disorder Labs: Lab Results  Component Value Date   HGBA1C 5.2 10/23/2018   MPG 102.54 10/23/2018   No results found for: PROLACTIN Lab Results  Component Value Date   CHOL 129 10/23/2018   TRIG 58 10/23/2018   HDL 50 10/23/2018   CHOLHDL 2.6 10/23/2018   VLDL 12 10/23/2018   LDLCALC 67 10/23/2018    Physical Findings: AIMS: Facial and Oral Movements Muscles of Facial Expression: None, normal Lips and Perioral Area: None, normal Jaw: None, normal Tongue: None, normal,Extremity Movements Upper (arms, wrists, hands, fingers): None, normal Lower (legs, knees, ankles, toes): None, normal, Trunk Movements Neck, shoulders, hips: None, normal, Overall Severity Severity of abnormal movements (highest score from questions above): None, normal Incapacitation due to abnormal movements: None, normal Patient's awareness of abnormal movements (rate only patient's report): No Awareness, Dental Status Current problems with teeth and/or dentures?: No Does patient usually wear dentures?: No  CIWA:    COWS:     Musculoskeletal: Strength & Muscle Tone: within normal limits Gait & Station:  normal Patient leans: N/A  Psychiatric Specialty Exam:  Presentation  General Appearance: Disheveled  Eye Contact:Fleeting  Speech:Pressured  Speech Volume:Increased  Handedness:Right   Mood and Affect  Mood:Irritable; Angry  Affect:Congruent   Thought Process  Thought Processes:Disorganized  Descriptions of Associations:Loose  Orientation:Full (Time, Place and Person)  Thought Content:Delusions; Paranoid Ideation; Tangential  History of Schizophrenia/Schizoaffective disorder:Yes  Duration of Psychotic Symptoms:Greater than six months  Hallucinations: Denies, but responding to internal stimuli Ideas of Reference:Delusions  Suicidal Thoughts:Denies Homicidal Thoughts:Denies  Sensorium  Memory:Immediate Fair; Recent Poor; Remote Poor  Judgment:Impaired  Insight:Lacking   Executive Functions  Concentration:Poor  Attention Span:Poor  Recall:Poor  Fund of Knowledge:Fair  Language:Fair   Psychomotor Activity  Psychomotor Activity:Restless  Assets  Assets:Financial Resources/Insurance; Housing; Resilience   Sleep  Sleep:Sleep: Fair Number of Hours of Sleep:  3.45    Physical Exam: Physical Exam  ROS  Blood pressure 107/84, pulse 75, temperature 98.1 F (36.7 C), temperature source Oral, resp. rate 14, height 6' (1.829 m), weight 113 kg, SpO2 95 %. Body mass index is 33.79 kg/m.   Treatment Plan Summary: Daily contact with patient to assess and evaluate symptoms and progress in treatment and Medication management 35 year old male with schizoaffective disorder, bipolar type. He was admitted for psychosis and bizarre behavior in context of medication non-compliance. While on the unit patient observed to be responding to internal stimuli, and is becoming increasingly hostile towards staff. Will continue non-emergent forced medications. Increase Zyprexa 25 mg PO QHS, or Zyprexa 15 mg IM. Will also continue PRNs for acute agitation. Hydralazine  PRN for elevated blood pressure.   10/25/20: Psychiatric exam above reviewed and remains accurate. Assessment and plan above reviewed and updated.    [name], MD 10/25/2020, 1:57 PM

## 2020-10-25 NOTE — Progress Notes (Signed)
Recreation Therapy Notes  Date: 10/25/2020  Time: 9:30 am  Location: Craft room    Behavioral response: Inappropriate, Redirection needed  Intervention Topic: Animal Assisted Therapy   Discussion/Intervention:  Animal Assisted Therapy took place today during group.  Animal Assisted Therapy is the planned inclusion of an animal in a patient's treatment plan. The patients were able to engage in therapy with an animal during group. Participants were educated on what a service dog is and the different between a support dog and a service dog. Patient were informed on the many animal needs there are and how their needs are similar. Individuals were enlightened on the process to get a service animal or support animal. Patients got the opportunity to pet the animal and were offered emotional support from the animal and staff.  Clinical Observations/Feedback:  Patient came to group and was on topic and was focused on what peers and staff had to say. Participant began to play air guitar,drums, whistling,sanpping, dancing and then singing loudly. Patient was redirected by writer but patient continued after a few minutes to sing and use profanity loudly. He was asked to returned to his room by group facilitator but patient refused stating "this is my room, then snapping his fingers saying now you are homeless; how does it feel?" Security came to group and asked patient to return to his room. Patient left group but later returned, he stood in front of the door way asking to return to group; request was denied. Individual left and came back to the door way to call writers name and make punching gestures. Patient eventually returned to his room.  Shaverence Outlaw LRT/CTRS          Shaverence  Outlaw 10/25/2020 12:52 PM

## 2020-10-25 NOTE — Progress Notes (Signed)
Pt is alert and oriented to person, place, time and situation. Pt paces the hallways, punching in the air, making noises and whistles loudly, screams out at times loudly. Pt is intrusive and impulsive, attention seeking, stares inappropriately at staff in the window, requires frequent redirections. Pt denies suicidal and homicidal ideation, denies hallucinations, denies feelings of depression and anxiety. Pt appears to be responding to internal stimuli, self-talk noted. Pt refused AM scheduled medication, MD/psychiatrist on unit notified. Will continue to monitor pt per Q15 minute face checks and monitor for safety and progress.

## 2020-10-25 NOTE — [facility] Group Notes (Signed)
LCSW Group Therapy Note     10/25/2020 2:01 PM     Type of Therapy/Topic:  Group Therapy:  Balance in Life     Participation Level:  Did Not Attend     Description of Group:    This group will address the concept of balance and how it feels and looks when one is unbalanced. Patients will be encouraged to process areas in their lives that are out of balance and identify reasons for remaining unbalanced. Facilitators will guide patients in utilizing problem-solving interventions to address and correct the stressor making their life unbalanced. Understanding and applying boundaries will be explored and addressed for obtaining and maintaining a balanced life. Patients will be encouraged to explore ways to assertively make their unbalanced needs known to significant others in their lives, using other group members and facilitator for support and feedback.     Therapeutic Goals:  1.      Patient will identify two or more emotions or situations they have that consume much of in their lives.  2.      Patient will identify signs/triggers that life has become out of balance:  3.      Patient will identify two ways to set boundaries in order to achieve balance in their lives:  4.      Patient will demonstrate ability to communicate their needs through discussion and/or role plays     Summary of Patient Progress: X      Therapeutic Modalities:   Cognitive Behavioral Therapy  Solution-Focused Therapy  Assertiveness Training     Arthur Aydelotte [place] MSW, LCSW-A  10/25/2020 2:01 PM

## 2020-10-25 NOTE — [facility] Counselor (Signed)
CSW called to check on status of [facility] referral.  CSW was informed that patient is currently under medical review.   Penni Homans, MSW, LCSW 10/25/2020 12:59 PM

## 2020-10-26 DIAGNOSIS — F250 Schizoaffective disorder, bipolar type: Secondary | ICD-10-CM | POA: Diagnosis not present

## 2020-10-26 MED ORDER — DIPHENHYDRAMINE HCL 25 MG PO CAPS: 50.0000 mg | ORAL_CAPSULE | Freq: Three times a day (TID) | ORAL | Status: DC

## 2020-10-26 MED ORDER — CHLORPROMAZINE HCL 25 MG/ML IJ SOLN: 25.0000 mg | Freq: Three times a day (TID) | INTRAMUSCULAR | Status: DC | PRN

## 2020-10-26 MED ORDER — DIPHENHYDRAMINE HCL 50 MG/ML IJ SOLN: 50.0000 mg | Freq: Three times a day (TID) | INTRAMUSCULAR | Status: DC | PRN

## 2020-10-26 MED ORDER — CHLORPROMAZINE HCL 25 MG/ML IJ SOLN
50.0000 mg | Freq: Three times a day (TID) | INTRAMUSCULAR | Status: DC | PRN
  Administered 2020-10-26 – 2020-10-29 (×2): 50 mg via INTRAMUSCULAR
  Filled 2020-10-26 (×3): qty 2

## 2020-10-26 MED ORDER — DIPHENHYDRAMINE HCL 50 MG/ML IJ SOLN
50.0000 mg | Freq: Three times a day (TID) | INTRAMUSCULAR | Status: DC | PRN
  Administered 2020-10-26: 50 mg via INTRAMUSCULAR
  Filled 2020-10-26: qty 1

## 2020-10-26 MED ORDER — CHLORPROMAZINE HCL 25 MG/ML IJ SOLN: 50.0000 mg | Freq: Three times a day (TID) | INTRAMUSCULAR | Status: DC | PRN

## 2020-10-26 MED ORDER — ZIPRASIDONE MESYLATE 20 MG IM SOLR
20.0000 mg | Freq: Four times a day (QID) | INTRAMUSCULAR | Status: DC | PRN
  Administered 2020-10-26: 20 mg via INTRAMUSCULAR

## 2020-10-26 MED ORDER — CHLORPROMAZINE HCL 100 MG PO TABS
50.0000 mg | ORAL_TABLET | Freq: Three times a day (TID) | ORAL | Status: DC | PRN
Start: 2020-10-26 — End: 2020-10-29

## 2020-10-26 MED ORDER — CHLORPROMAZINE HCL 25 MG PO TABS: 25.0000 mg | ORAL_TABLET | Freq: Three times a day (TID) | ORAL | Status: DC | PRN

## 2020-10-26 NOTE — Progress Notes (Signed)
Call from RN, Ander Slade, regarding Geodon order. A message had been sent to md regarding Geodon and Zyprexa IM prn orders. According to Rn, Joy, the Zyprexa did not work and patient continued to be agitated and want the zyprexa given. Ordered verified by this pharmacist

## 2020-10-26 NOTE — Progress Notes (Signed)
Patient alert and oriented x 4, affect is blunted , he is argumentative and irritable with staff, he appears responding to internal stimuli, he was was noted whistling on the unit, snaps his finger, and flick his wrist. Patient stares at staff and bangs the window at the nursing station. Patient was complaint with medication regimen, 15 minutes safety checks maintained will continue to ,momitor

## 2020-10-26 NOTE — Progress Notes (Signed)
Thorazine was effective, pt was sleeping, normal respirations, until pt woke at 1620 at which time pt was groggy and calmer ate dinner and started making loud whistle sounds again. Will continue to monitor.

## 2020-10-26 NOTE — [facility] Group Notes (Signed)
LCSW Group Therapy Note  10/26/2020 12:31 PM  Type of Therapy/Topic:  Group Therapy:  Emotion Regulation  Participation Level:  Did Not Attend   Description of Group:   The purpose of this group is to assist patients in learning to regulate negative emotions and experience positive emotions. Patients will be guided to discuss ways in which they have been vulnerable to their negative emotions. These vulnerabilities will be juxtaposed with experiences of positive emotions or situations, and patients will be challenged to use positive emotions to combat negative ones. Special emphasis will be placed on coping with negative emotions in conflict situations, and patients will process healthy conflict resolution skills.  Therapeutic Goals: 1. Patient will identify two positive emotions or experiences to reflect on in order to balance out negative emotions 2. Patient will label two or more emotions that they find the most difficult to experience 3. Patient will demonstrate positive conflict resolution skills through discussion and/or role plays  Summary of Patient Progress: X  Therapeutic Modalities:   Cognitive Behavioral Therapy Feelings Identification Dialectical Behavioral Therapy  Penni Homans, MSW, LCSW 10/26/2020 12:31 PM

## 2020-10-26 NOTE — [facility] Counselor (Signed)
CSW faxed additional referral information to [facility].    Nurse and physician notes sent from 3/23-25/2022.  CSW received confirmation that fax was successful.  Vilma Meckel. Algis Greenhouse, MSW, LCSW, LCAS 10/26/2020 4:09 PM

## 2020-10-26 NOTE — Progress Notes (Signed)
[facility] MD Progress Note  10/26/2020 1:56 PM Jackson [name]  MRN:  [id number]   CC: "I'll accept the consequence"   Subjective:  Jackson [name] is a 35 year old male with a history of schizoaffective disorder brought to the emergency department on October 17, 2020 by law enforcement after he was found walking naked down the street. No acute overnight events though patient continued to be intrusive and loud, he was compliant with medications, ADLs remain impaired.   This morning patient was intrusive, hostile, intruding personal space of peers, and grabbed a peers arm to take remote control on of patient's hand. At this time, security needed to intervene as he continued to be hostile towards elderly peer, and this peer along with two other peers were threatening to hit him to make him leave the room. Jackson [name] was escorted by security towards his room and given Zyprexa 10 mg IM. Within roughly an hour and a half patient was back up becoming aggressive in dayroom again, slamming doors, and punching and kicking in the air towards patients and security guards. He was once again escorted to his room, and was given Geodon 20 mg IM. At this time the unit doors were locked to isolate patient to back hall of unit away from peers on main unit. When approached by provider, he does have some insight today in that he should not have yelled at peer or grabbed his arm. He states that he "did the crime, and will accept the punishments." He expresses understanding about doors being closed to keep him from peers until he can remain calm and maintain safety on the unit. His insight into his own mental illness remain poor, and he continues to insist there is nothing wrong with him, he does not need medicine, and should just be allowed to go home.    Principal Problem: Schizoaffective disorder, bipolar type (HCC) Diagnosis: Principal Problem:   Schizoaffective disorder, bipolar type (HCC) Active Problems:   Noncompliance  Total  Time spent with patient: 30 minutes  Past Psychiatric History: See H&P  Past Medical History:  Past Medical History:  Diagnosis Date  . Bipolar 1 disorder (HCC)   . Schizoaffective disorder (HCC)    History reviewed. No pertinent surgical history. Family History: History reviewed. No pertinent family history. Family Psychiatric  History: See H&P Social History:  Social History   Substance and Sexual Activity  Alcohol Use Not Currently     Social History   Substance and Sexual Activity  Drug Use Not Currently    Social History   Socioeconomic History  . Marital status: Single    Spouse name: Not on file  . Number of children: Not on file  . Years of education: Not on file  . Highest education level: Not on file  Occupational History  . Not on file  Tobacco Use  . Smoking status: Light Tobacco Smoker    Packs/day: 0.25    Types: Cigarettes  . Smokeless tobacco: Never Used  Vaping Use  . Vaping Use: Never used  Substance and Sexual Activity  . Alcohol use: Not Currently  . Drug use: Not Currently  . Sexual activity: Not on file  Other Topics Concern  . Not on file  Social History Narrative  . Not on file   Social Determinants of Health   Financial Resource Strain: Not on file  Food Insecurity: Not on file  Transportation Needs: Not on file  Physical Activity: Not on file  Stress: Not on file  Social Connections: Not on file   Additional Social History:     Sleep: Fair  Appetite:  Fair  Current Medications: Current Facility-Administered Medications  Medication Dose Route Frequency Provider Last Rate Last Admin  . acetaminophen (TYLENOL) tablet 650 mg  650 mg Oral Q6H PRN [name], [name], MD      . alum & mag hydroxide-simeth (MAALOX/MYLANTA) 200-200-20 MG/5ML suspension 30 mL  30 mL Oral Q4H PRN [name], [name], MD      . hydrALAZINE (APRESOLINE) tablet 10 mg  10 mg Oral Q6H PRN [name], MD      . hydrOXYzine (ATARAX/VISTARIL) tablet 50 mg   50 mg Oral TID PRN [name], [name], MD      . magnesium hydroxide (MILK OF MAGNESIA) suspension 30 mL  30 mL Oral Daily PRN [name], [name], MD      . OLANZapine zydis (ZYPREXA) disintegrating tablet 10 mg  10 mg Oral QID PRN Reggie Pile, MD       Or  . OLANZapine (ZYPREXA) injection 10 mg  10 mg Intramuscular QID PRN Reggie Pile, MD   10 mg at 10/26/20 1696  . OLANZapine (ZYPREXA) injection 15 mg  15 mg Intramuscular QHS [name], MD       Or  . OLANZapine zydis (ZYPREXA) disintegrating tablet 25 mg  25 mg Oral QHS [name], MD   25 mg at 10/25/20 2123    Lab Results: No results found for this or any previous visit (from the past 48 hour(s)).  Blood Alcohol level:  Lab Results  Component Value Date   ETH <10 10/17/2020   ETH <10 08/11/2020    Metabolic Disorder Labs: Lab Results  Component Value Date   HGBA1C 5.2 10/23/2018   MPG 102.54 10/23/2018   No results found for: PROLACTIN Lab Results  Component Value Date   CHOL 129 10/23/2018   TRIG 58 10/23/2018   HDL 50 10/23/2018   CHOLHDL 2.6 10/23/2018   VLDL 12 10/23/2018   LDLCALC 67 10/23/2018    Physical Findings: AIMS: Facial and Oral Movements Muscles of Facial Expression: None, normal Lips and Perioral Area: None, normal Jaw: None, normal Tongue: None, normal,Extremity Movements Upper (arms, wrists, hands, fingers): None, normal Lower (legs, knees, ankles, toes): None, normal, Trunk Movements Neck, shoulders, hips: None, normal, Overall Severity Severity of abnormal movements (highest score from questions above): None, normal Incapacitation due to abnormal movements: None, normal Patient's awareness of abnormal movements (rate only patient's report): No Awareness, Dental Status Current problems with teeth and/or dentures?: No Does patient usually wear dentures?: No  CIWA:    COWS:     Musculoskeletal: Strength & Muscle Tone: within normal limits Gait & Station: normal Patient leans:  N/A  Psychiatric Specialty Exam:  Presentation  General Appearance: Disheveled  Eye Contact:Fleeting  Speech:Pressured  Speech Volume:Increased  Handedness:Right   Mood and Affect  Mood:Irritable; Angry  Affect:Congruent   Thought Process  Thought Processes:Disorganized  Descriptions of Associations:Loose  Orientation:Full (Time, Place and Person)  Thought Content:Delusions; Paranoid Ideation; Tangential  History of Schizophrenia/Schizoaffective disorder:Yes  Duration of Psychotic Symptoms:Greater than six months  Hallucinations: Denies, but responding to internal stimuli Ideas of Reference:Delusions  Suicidal Thoughts:Denies Homicidal Thoughts:Denies  Sensorium  Memory:Immediate Fair; Recent Poor; Remote Poor  Judgment:Impaired  Insight:Lacking   Executive Functions  Concentration:Poor  Attention Span:Poor  Recall:Poor  Fund of Knowledge:Fair  Language:Fair   Psychomotor Activity  Psychomotor Activity:Restless  Assets  Assets:Financial Resources/Insurance; Housing; Resilience  Sleep  Sleep:Sleep: Poor Number of Hours of Sleep: 4    Physical Exam: Physical Exam  ROS  Blood pressure 118/82, pulse (!) 55, temperature 98.3 F (36.8 C), temperature source Oral, resp. rate 18, height 6' (1.829 m), weight 113 kg, SpO2 100 %. Body mass index is 33.79 kg/m.   Treatment Plan Summary: Daily contact with patient to assess and evaluate symptoms and progress in treatment and Medication management 35 year old male with schizoaffective disorder, bipolar type. He was admitted for psychosis and bizarre behavior in context of medication non-compliance. While on the unit patient observed to be responding to internal stimuli, and is becoming increasingly hostile towards staff. Will continue non-emergent forced medications. Continue Zyprexa 25 mg PO QHS, or Zyprexa 15 mg IM (dose increased yesterday).  Will also continue PRNs for acute agitation.  Hydralazine PRN for elevated blood pressure.   10/26/20: Psychiatric exam above reviewed and remains accurate. Assessment and plan above reviewed and updated.     [name], MD 10/26/2020, 1:56 PM

## 2020-10-26 NOTE — Progress Notes (Signed)
Pt became agitated aggressive and threatening, loud yelling, posturing in a hostile manner. Unit MD/psychiatrist present to witness this aggression and ordered PRN Thorazine and Benadryl IM, which with security present was given to pt. Pt was cooperative with IM medication administration. Will continue to monitor pt for safety and progress.

## 2020-10-26 NOTE — Progress Notes (Signed)
Pt became agitated this morning, intrusive, hostile, stepping within 12 inches into peers and staff personal space, whistling in the faces of peers and staff, making threatening postures with his body and hands, did not respond to verbal redirection of nursing staff or security staff, grabbed a remote control out of another pt's hands, attempting to intimidate an elderly male peer pt, redirected by security back to pt's room.  Pt given PRN IM Zyprexa 10mg  for agitated and aggressive behavior at 08:28am. It was effective until about 11:00am, pt woke up and became aggressive again in the dayroom, two security escorted pt to his room, MD/psychiatrist was on unit in nurses station, and ordered Geodon 20mg  IM PRN and asked for it to be given; it was given at 11:12am. Pharmacy was notified that MD was aware that the Zyprexa was given earlier this shift, and it was minimally effective for a short period of time, then became ineffective at 11:00am, pt continued with aggressive behavior and agitation again, and the MD also wanted the Geodon 20mg  IM to be given. Pt then went to sleep for only a brief period of time, for about 20 mins. after Geodon was given, then woke up at 11:35am asking for his lunch. Lunch was provided. Due to the intrusiveness of the pt's behavior into other pt's personal space, another male patient reported he felt threatened and would take physical action against this patient, the double doors for the second half of the unit could be restricted to prevent this patient from entering the hallway that his peers were in, and instead remain on the back half of the unit. Unit's MD/psychiatrist, Charge RN, and [facility] are aware of these safety adjustments. Will continue to monitor pt for safety and progress.

## 2020-10-27 DIAGNOSIS — F250 Schizoaffective disorder, bipolar type: Secondary | ICD-10-CM | POA: Diagnosis not present

## 2020-10-27 NOTE — Tx Team (Cosign Needed)
Interdisciplinary Treatment and Diagnostic Plan Update  10/27/2020 Time of Session: 1:00 PM  Jackson [name] MRN: [id number]  Principal Diagnosis: Schizoaffective disorder, bipolar type [facility])  Secondary Diagnoses: Principal Problem:   Schizoaffective disorder, bipolar type ([place]) Active Problems:   Noncompliance   Current Medications:  Current Facility-Administered Medications  Medication Dose Route Frequency Provider Last Rate Last Admin  . acetaminophen (TYLENOL) tablet 650 mg  650 mg Oral Q6H PRN [name], [name], MD      . alum & mag hydroxide-simeth (MAALOX/MYLANTA) 200-200-20 MG/5ML suspension 30 mL  30 mL Oral Q4H PRN [name], [name], MD      . chlorproMAZINE (THORAZINE) injection 50 mg  50 mg Intramuscular TID PRN Salley Scarlet, MD   50 mg at 10/26/20 1515   And  . diphenhydrAMINE (BENADRYL) injection 50 mg  50 mg Intramuscular TID PRN Salley Scarlet, MD   50 mg at 10/26/20 1514  . chlorproMAZINE (THORAZINE) tablet 50 mg  50 mg Oral TID PRN Salley Scarlet, MD       And  . diphenhydrAMINE (BENADRYL) capsule 50 mg  50 mg Oral TID Salley Scarlet, MD      . hydrALAZINE (APRESOLINE) tablet 10 mg  10 mg Oral Q6H PRN Salley Scarlet, MD      . hydrOXYzine (ATARAX/VISTARIL) tablet 50 mg  50 mg Oral TID PRN [name], Madie [place], MD      . magnesium hydroxide (MILK OF MAGNESIA) suspension 30 mL  30 mL Oral Daily PRN [name], [name], MD      . OLANZapine (ZYPREXA) injection 15 mg  15 mg Intramuscular QHS Salley Scarlet, MD       Or  . OLANZapine zydis (ZYPREXA) disintegrating tablet 25 mg  25 mg Oral QHS Salley Scarlet, MD   25 mg at 10/26/20 2120   PTA Medications: Medications Prior to Admission  Medication Sig Dispense Refill Last Dose  . lurasidone (LATUDA) 20 MG TABS tablet Take 1 tablet (20 mg total) by mouth daily with supper. (Patient not taking: Reported on 10/17/2020) 30 tablet 1   . OLANZapine (ZYPREXA) 20 MG tablet Take 1 tablet (20 mg total) by mouth at bedtime.  (Patient not taking: Reported on 10/17/2020) 30 tablet 1     Patient Stressors: Medication change or noncompliance  Patient Strengths: Curator fund of knowledge Supportive family/friends  Treatment Modalities: Medication Management, Group therapy, Case management,  1 to 1 session with clinician, Psychoeducation, Recreational therapy.   Physician Treatment Plan for Primary Diagnosis: Schizoaffective disorder, bipolar type ([place]) Long Term Goal(s): Improvement in symptoms so as ready for discharge Improvement in symptoms so as ready for discharge   Short Term Goals:    Medication Management: Evaluate patient's response, side effects, and tolerance of medication regimen.  Therapeutic Interventions: 1 to 1 [place], Unit Group [place] and Medication administration.  Evaluation of Outcomes: Not Met  Physician Treatment Plan for Secondary Diagnosis: Principal Problem:   Schizoaffective disorder, bipolar type ([place]) Active Problems:   Noncompliance  Long Term Goal(s): Improvement in symptoms so as ready for discharge Improvement in symptoms so as ready for discharge   Short Term Goals:       Medication Management: Evaluate patient's response, side effects, and tolerance of medication regimen.  Therapeutic Interventions: 1 to 1 [place], Unit Group [place] and Medication administration.  Evaluation of Outcomes: Not Met   RN Treatment Plan for Primary Diagnosis: Schizoaffective disorder, bipolar type ([place]) Long Term Goal(s): Knowledge of  disease and therapeutic regimen to maintain health will improve  Short Term Goals: Ability to remain free from injury will improve, Ability to verbalize frustration and anger appropriately will improve, Ability to demonstrate self-control, Ability to participate in decision making will improve, Ability to verbalize feelings will improve, Ability to identify and develop effective coping behaviors will improve and Compliance with  prescribed medications will improve  Medication Management: RN will administer medications as ordered by provider, will assess and evaluate patient's response and provide education to patient for prescribed medication. RN will report any adverse and/or side effects to prescribing provider.  Therapeutic Interventions: 1 on 1 counseling [place], Psychoeducation, Medication administration, Evaluate responses to treatment, Monitor vital signs and CBGs as ordered, Perform/monitor CIWA, COWS, AIMS and Fall Risk screenings as ordered, Perform wound care treatments as ordered.  Evaluation of Outcomes: Not Met   LCSW Treatment Plan for Primary Diagnosis: Schizoaffective disorder, bipolar type ([place]) Long Term Goal(s): Safe transition to appropriate next level of care at discharge, Engage patient in therapeutic group addressing interpersonal concerns.  Short Term Goals: Engage patient in aftercare planning with referrals and resources, Increase ability to appropriately verbalize feelings, Increase emotional regulation, Facilitate acceptance of mental health diagnosis and concerns, Identify triggers associated with mental health/substance abuse issues and Increase skills for wellness and recovery  Therapeutic Interventions: Assess for all discharge needs, 1 to 1 time with Social worker, Explore available resources and support systems, Assess for adequacy in community support network, Educate family and significant other(s) on suicide prevention, Complete Psychosocial Assessment, Interpersonal group therapy.  Evaluation of Outcomes: Not Met   Progress in Treatment: Attending groups: No. Participating in groups: No. Taking medication as prescribed: No. Toleration medication: No. Family/Significant other contact made: No, will contact:  Patient declined  Patient understands diagnosis: Yes. Discussing patient identified problems/goals with staff: No. Medical problems stabilized or resolved: Yes. Denies  suicidal/homicidal ideation: Yes. Issues/concerns per patient self-inventory: No. Other: None  New problem(s) identified: No, Describe:  None  New Short Term/Long Term Goal(s): Elimination of symptoms of psychosis, medication management for mood stabilization; development of comprehensive mental wellness plan.   Patient Goals: Patient declined to participate in treatment team meeting despite invitation by nursing staff. Update: 10/27/20 No changes at this time  Discharge Plan or Barriers: CSW will assist with development of an appropriate discharge/aftercare plan. Update: 10/27/20 No changes at this time  Reason for Continuation of Hospitalization: Aggression Delusions  Hallucinations Medication stabilization  Estimated Length of Stay:  Attendees: Patient: 10/27/2020 1:00 PM  Physician: Dr. Alethia Berthold, MD 10/27/2020 1:00 PM  Nursing:  10/27/2020 1:00 PM  RN Care Manager: 10/27/2020 1:00 PM  Social Worker: Raina Mina, [place] 10/27/2020 1:00 PM  Recreational Therapist:  10/27/2020 1:00 PM  Other:  10/27/2020 1:00 PM  Other:  10/27/2020 1:00 PM  Other: 10/27/2020 1:00 PM    Scribe for Treatment Team: Raina Mina, [place] 10/27/2020 1:00 PM

## 2020-10-27 NOTE — [facility] Group Notes (Signed)
[facility] Group Notes: (Clinical Social Work)   10/27/2020      Type of Therapy:  Group Therapy   Participation Level:  Did Not Attend - was invited individually by Nurse/MHT and chose not to attend.  Unable to attend due to behaviors.    Susa Simmonds, LCSWA 10/27/2020  3:12 PM

## 2020-10-27 NOTE — Progress Notes (Signed)
[facility] MD Progress Note  10/27/2020 12:35 PM Jackson [name]  MRN:  [id number] Subjective: Follow-up 35 year old man with schizoaffective disorder who is back in the hospital for more disruptive behavior.  Since his admission his behavior on the unit has been a source of disruption to his own treatment as well as the ward in general.  He has multiple episodes of agitated disruptive behavior that will insight aggression from other patients.  The ward has had to come up with a system that allows him to have access to treatment but to be separated from other patients that he has terrorized.  On interview I found the patient in his room which is remarkably filthy.  Food waste is strewn all over.  Patient has declined to assist with picking it up.  He is not a good historian or believable as far as his current symptoms.  Tends to talk in a sarcastic ironic way full of hand gestures and odd verbal sounds.  Not threatening or hostile to me denies suicidal thought. Principal Problem: Schizoaffective disorder, bipolar type (HCC) Diagnosis: Principal Problem:   Schizoaffective disorder, bipolar type (HCC) Active Problems:   Noncompliance  Total Time spent with patient: 30 minutes  Past Psychiatric History: Past history of multiple admissions some of them long because of his chronic mental illness which is much worsened by his poor insight and refusal to really cooperate with appropriate treatment  Past Medical History:  Past Medical History:  Diagnosis Date  . Bipolar 1 disorder (HCC)   . Schizoaffective disorder (HCC)    History reviewed. No pertinent surgical history. Family History: History reviewed. No pertinent family history. Family Psychiatric  History: See previous Social History:  Social History   Substance and Sexual Activity  Alcohol Use Not Currently     Social History   Substance and Sexual Activity  Drug Use Not Currently    Social History   Socioeconomic History  . Marital  status: Single    Spouse name: Not on file  . Number of children: Not on file  . Years of education: Not on file  . Highest education level: Not on file  Occupational History  . Not on file  Tobacco Use  . Smoking status: Light Tobacco Smoker    Packs/day: 0.25    Types: Cigarettes  . Smokeless tobacco: Never Used  Vaping Use  . Vaping Use: Never used  Substance and Sexual Activity  . Alcohol use: Not Currently  . Drug use: Not Currently  . Sexual activity: Not on file  Other Topics Concern  . Not on file  Social History Narrative  . Not on file   Social Determinants of Health   Financial Resource Strain: Not on file  Food Insecurity: Not on file  Transportation Needs: Not on file  Physical Activity: Not on file  Stress: Not on file  Social Connections: Not on file   Additional Social History:                         Sleep: Fair  Appetite:  Good  Current Medications: Current Facility-Administered Medications  Medication Dose Route Frequency Provider Last Rate Last Admin  . acetaminophen (TYLENOL) tablet 650 mg  650 mg Oral Q6H PRN Shey Bartmess T, MD      . alum & mag hydroxide-simeth (MAALOX/MYLANTA) 200-200-20 MG/5ML suspension 30 mL  30 mL Oral Q4H PRN Abbiegail Landgren T, MD      . chlorproMAZINE (THORAZINE) injection 50 mg  50 mg Intramuscular TID PRN [name], MD   50 mg at 10/26/20 1515   And  . diphenhydrAMINE (BENADRYL) injection 50 mg  50 mg Intramuscular TID PRN [name], MD   50 mg at 10/26/20 1514  . chlorproMAZINE (THORAZINE) tablet 50 mg  50 mg Oral TID PRN [name], MD       And  . diphenhydrAMINE (BENADRYL) capsule 50 mg  50 mg Oral TID [name], MD      . hydrALAZINE (APRESOLINE) tablet 10 mg  10 mg Oral Q6H PRN [name], MD      . hydrOXYzine (ATARAX/VISTARIL) tablet 50 mg  50 mg Oral TID PRN Wrenley Sayed, [name], MD      . magnesium hydroxide (MILK OF MAGNESIA) suspension 30 mL  30 mL Oral Daily PRN  Leandrew Keech T, MD      . OLANZapine (ZYPREXA) injection 15 mg  15 mg Intramuscular QHS [name], MD       Or  . OLANZapine zydis (ZYPREXA) disintegrating tablet 25 mg  25 mg Oral QHS [name], MD   25 mg at 10/26/20 2120    Lab Results: No results found for this or any previous visit (from the past 48 hour(s)).  Blood Alcohol level:  Lab Results  Component Value Date   ETH <10 10/17/2020   ETH <10 08/11/2020    Metabolic Disorder Labs: Lab Results  Component Value Date   HGBA1C 5.2 10/23/2018   MPG 102.54 10/23/2018   No results found for: PROLACTIN Lab Results  Component Value Date   CHOL 129 10/23/2018   TRIG 58 10/23/2018   HDL 50 10/23/2018   CHOLHDL 2.6 10/23/2018   VLDL 12 10/23/2018   LDLCALC 67 10/23/2018    Physical Findings: AIMS: Facial and Oral Movements Muscles of Facial Expression: None, normal Lips and Perioral Area: None, normal Jaw: None, normal Tongue: None, normal,Extremity Movements Upper (arms, wrists, hands, fingers): None, normal Lower (legs, knees, ankles, toes): None, normal, Trunk Movements Neck, shoulders, hips: None, normal, Overall Severity Severity of abnormal movements (highest score from questions above): None, normal Incapacitation due to abnormal movements: None, normal Patient's awareness of abnormal movements (rate only patient's report): No Awareness, Dental Status Current problems with teeth and/or dentures?: No Does patient usually wear dentures?: No  CIWA:    COWS:     Musculoskeletal: Strength & Muscle Tone: within normal limits Gait & Station: normal Patient leans: N/A  Psychiatric Specialty Exam:  Presentation  General Appearance: Disheveled  Eye Contact:Fleeting  Speech:Pressured  Speech Volume:Increased  Handedness:Right   Mood and Affect  Mood:Irritable; Angry  Affect:Congruent   Thought Process  Thought Processes:Disorganized  Descriptions of  Associations:Loose  Orientation:Full (Time, Place and Person)  Thought Content:Delusions; Paranoid Ideation; Tangential  History of Schizophrenia/Schizoaffective disorder:Yes  Duration of Psychotic Symptoms:Greater than six months  Hallucinations:No data recorded Ideas of Reference:Delusions  Suicidal Thoughts:No data recorded Homicidal Thoughts:No data recorded  Sensorium  Memory:Immediate Fair; Recent Poor; Remote Poor  Judgment:Impaired  Insight:Lacking   Executive Functions  Concentration:Poor  Attention Span:Poor  Recall:Poor  Fund of Knowledge:Fair  Language:Fair   Psychomotor Activity  Psychomotor Activity:No data recorded  Assets  Assets:Financial Resources/Insurance; Housing; Resilience   Sleep  Sleep:No data recorded   Physical Exam: Physical Exam Vitals and nursing note reviewed.  Constitutional:      Appearance: Normal appearance.  HENT:     Head: Normocephalic and atraumatic.     Mouth/Throat:  Pharynx: Oropharynx is clear.  Eyes:     Pupils: Pupils are equal, round, and reactive to light.  Cardiovascular:     Rate and Rhythm: Normal rate and regular rhythm.  Pulmonary:     Effort: Pulmonary effort is normal.     Breath sounds: Normal breath sounds.  Abdominal:     General: Abdomen is flat.     Palpations: Abdomen is soft.  Musculoskeletal:        General: Normal range of motion.  Skin:    General: Skin is warm and dry.  Neurological:     General: No focal deficit present.     Mental Status: He is alert. Mental status is at baseline.  Psychiatric:        Attention and Perception: He is inattentive.        Mood and Affect: Affect is labile.        Speech: Speech is tangential.        Behavior: Behavior is agitated. Behavior is not aggressive or hyperactive.        Thought Content: Thought content is delusional. Thought content does not include homicidal or suicidal ideation.        Cognition and Memory: Cognition is  impaired.        Judgment: Judgment is inappropriate.    Review of Systems  Constitutional: Negative.   HENT: Negative.   Eyes: Negative.   Respiratory: Negative.   Cardiovascular: Negative.   Gastrointestinal: Negative.   Musculoskeletal: Negative.   Skin: Negative.   Neurological: Negative.   Psychiatric/Behavioral: Negative for depression, hallucinations, substance abuse and suicidal ideas. The patient is nervous/anxious and has insomnia.    Blood pressure 118/82, pulse (!) 55, temperature 98.3 F (36.8 C), temperature source Oral, resp. rate 18, height 6' (1.829 m), weight 113 kg, SpO2 100 %. Body mass index is 33.79 kg/m.   Treatment Plan Summary: Daily contact with patient to assess and evaluate symptoms and progress in treatment, Medication management and Plan Patient continues to be on force medicines because of poor insight and refusal of medicine.  Current plan appears to be trying to maximize the use of olanzapine because it can be given as an injectable.  Patient has been refusing other medicines including mood stabilizers.  No new physical problem needing intervention at this point.  Long-term prognosis remains unclear as the patient would most likely benefit from lengthy rehabilitation in a hospital which is rarely available in our current circumstance.  Tried to do some rapport building support and encouragement to him to be more cooperative with his treatment .  Mordecai Rasmussen, MD 10/27/2020, 12:35 PM

## 2020-10-27 NOTE — Plan of Care (Signed)
    Problem: Education: Goal: Will be free of psychotic symptoms 10/27/2020 2229 by Butler-Nicholson, Laurelai Lepp L, LPN Outcome: Not Progressing  Problem: Health Behavior/Discharge Planning: Goal: Compliance with prescribed medication regimen will improve 10/27/2020 2229 by Butler-Nicholson, Keerat Denicola L, LPN Outcome: Progressing

## 2020-10-27 NOTE — Progress Notes (Signed)
Patient alert and oriented x 4, affect is blunted , he appears responding to internal stimuli, he was was noted whistling on the unit, snaps his finger, and flick his wrist. Patient stares at staff and bangs the window at the nursing station. Patient was complaint with medication regimen, 15 minutes safety checks maintained will continue to ,momitor

## 2020-10-27 NOTE — Progress Notes (Signed)
At beginning of the shift patient was pacing the hall in labile mood.  Upset with having his movement on the unit restricted. After change of shift restriction was lifted and his mood and affect changed for the better.  He continues to respond to internal stimuli but has decreased his intrusive attention seeking behavior. Despite being observed, he denies SI  HI AVH depression anxiety and pain at this encounter. He was med compliant and received his medication without incident.  He continues to be monitored with 15 minute safety rounds and was encouraged to come to staff with any concerns.  \    Cleo Butler-Nicholson, LPN

## 2020-10-27 NOTE — Progress Notes (Signed)
PT has been intrusive, attention seeking, whistling, snapping, pacing, yelling, angry, banging on the windows of the nurses station, staring inappropriately at staff. Pt uses profanity calling staff names and asking staff where they live. Pt has been responding to internal stimuli, self talk noted. Pt requires frequent redirection, does not redirect easily. Will continue to monitor pt per Q15 minute face checks and monitor for safety and progress.

## 2020-10-28 DIAGNOSIS — F250 Schizoaffective disorder, bipolar type: Secondary | ICD-10-CM | POA: Diagnosis not present

## 2020-10-28 NOTE — Progress Notes (Signed)
Jackson [name] [facility] MD Progress Note  10/28/2020 11:58 AM Jackson [name]  MRN:  [id number] Subjective: Follow-up patient with schizoaffective disorder.  Patient continues to be agitated to the point of problematic to work with for at least much of the day.  Yesterday evening and night he was evidently agitated out of bed pacing making a lot of noise.  It sounds to some extent like he has his "days and nights mixed up".  He was sleeping when I came to see him this morning and was tired when I woke him.  When he is awake patient is responding to internal stimuli doing stereotyped behavior whistling loudly snapping his fingers pacing around.  Still has poor insight refuses to take oral medication.  Nursing feels that he is a little bit improved from when he first came in but still agitated and unmotivated for appropriate treatment Principal Problem: Schizoaffective disorder, bipolar type (HCC) Diagnosis: Principal Problem:   Schizoaffective disorder, bipolar type (HCC) Active Problems:   Noncompliance  Total Time spent with patient: 30 minutes  Past Psychiatric History: Past history of schizoaffective disorder has had multiple admissions including very lengthy ones in the past because of the slowness with which she responds to medication.  Long history of noncompliance.  Past Medical History:  Past Medical History:  Diagnosis Date  . Bipolar 1 disorder (HCC)   . Schizoaffective disorder (HCC)    History reviewed. No pertinent surgical history. Family History: History reviewed. No pertinent family history. Family Psychiatric  History: See previous Social History:  Social History   Substance and Sexual Activity  Alcohol Use Not Currently     Social History   Substance and Sexual Activity  Drug Use Not Currently    Social History   Socioeconomic History  . Marital status: Single    Spouse name: Not on file  . Number of children: Not on file  . Years of education: Not on file  . Highest education  level: Not on file  Occupational History  . Not on file  Tobacco Use  . Smoking status: Light Tobacco Smoker    Packs/day: 0.25    Types: Cigarettes  . Smokeless tobacco: Never Used  Vaping Use  . Vaping Use: Never used  Substance and Sexual Activity  . Alcohol use: Not Currently  . Drug use: Not Currently  . Sexual activity: Not on file  Other Topics Concern  . Not on file  Social History Narrative  . Not on file   Social Determinants of Health   Financial Resource Strain: Not on file  Food Insecurity: Not on file  Transportation Needs: Not on file  Physical Activity: Not on file  Stress: Not on file  Social Connections: Not on file   Additional Social History:                         Sleep: Poor  Appetite:  Fair  Current Medications: Current Facility-Administered Medications  Medication Dose Route Frequency Provider Last Rate Last Admin  . acetaminophen (TYLENOL) tablet 650 mg  650 mg Oral Q6H PRN Maeson Lourenco T, MD      . alum & mag hydroxide-simeth (MAALOX/MYLANTA) 200-200-20 MG/5ML suspension 30 mL  30 mL Oral Q4H PRN Kham Zuckerman T, MD      . chlorproMAZINE (THORAZINE) injection 50 mg  50 mg Intramuscular TID PRN [name], MD   50 mg at 10/26/20 1515   And  . diphenhydrAMINE (BENADRYL) injection 50 mg  50 mg Intramuscular TID PRN [name], MD   50 mg at 10/26/20 1514  . chlorproMAZINE (THORAZINE) tablet 50 mg  50 mg Oral TID PRN [name], MD       And  . diphenhydrAMINE (BENADRYL) capsule 50 mg  50 mg Oral TID [name], MD      . hydrALAZINE (APRESOLINE) tablet 10 mg  10 mg Oral Q6H PRN [name], MD      . hydrOXYzine (ATARAX/VISTARIL) tablet 50 mg  50 mg Oral TID PRN Christiana Gurevich, [name], MD   50 mg at 10/27/20 2121  . magnesium hydroxide (MILK OF MAGNESIA) suspension 30 mL  30 mL Oral Daily PRN Jeevan Kalla T, MD      . OLANZapine (ZYPREXA) injection 15 mg  15 mg Intramuscular QHS [name], MD       Or   . OLANZapine zydis (ZYPREXA) disintegrating tablet 25 mg  25 mg Oral QHS [name], MD   25 mg at 10/27/20 2121    Lab Results: No results found for this or any previous visit (from the past 48 hour(s)).  Blood Alcohol level:  Lab Results  Component Value Date   ETH <10 10/17/2020   ETH <10 08/11/2020    Metabolic Disorder Labs: Lab Results  Component Value Date   HGBA1C 5.2 10/23/2018   MPG 102.54 10/23/2018   No results found for: PROLACTIN Lab Results  Component Value Date   CHOL 129 10/23/2018   TRIG 58 10/23/2018   HDL 50 10/23/2018   CHOLHDL 2.6 10/23/2018   VLDL 12 10/23/2018   LDLCALC 67 10/23/2018    Physical Findings: AIMS: Facial and Oral Movements Muscles of Facial Expression: None, normal Lips and Perioral Area: None, normal Jaw: None, normal Tongue: None, normal,Extremity Movements Upper (arms, wrists, hands, fingers): None, normal Lower (legs, knees, ankles, toes): None, normal, Trunk Movements Neck, shoulders, hips: None, normal, Overall Severity Severity of abnormal movements (highest score from questions above): None, normal Incapacitation due to abnormal movements: None, normal Patient's awareness of abnormal movements (rate only patient's report): No Awareness, Dental Status Current problems with teeth and/or dentures?: No Does patient usually wear dentures?: No  CIWA:    COWS:     Musculoskeletal: Strength & Muscle Tone: within normal limits Gait & Station: normal Patient leans: N/A  Psychiatric Specialty Exam:  Presentation  General Appearance: Disheveled  Eye Contact:Fleeting  Speech:Pressured  Speech Volume:Increased  Handedness:Right   Mood and Affect  Mood:Irritable; Angry  Affect:Congruent   Thought Process  Thought Processes:Disorganized  Descriptions of Associations:Loose  Orientation:Full (Time, Place and Person)  Thought Content:Delusions; Paranoid Ideation; Tangential  History of  Schizophrenia/Schizoaffective disorder:Yes  Duration of Psychotic Symptoms:Greater than six months  Hallucinations:No data recorded Ideas of Reference:Delusions  Suicidal Thoughts:No data recorded Homicidal Thoughts:No data recorded  Sensorium  Memory:Immediate Fair; Recent Poor; Remote Poor  Judgment:Impaired  Insight:Lacking   Executive Functions  Concentration:Poor  Attention Span:Poor  Recall:Poor  Fund of Knowledge:Fair  Language:Fair   Psychomotor Activity  Psychomotor Activity:No data recorded  Assets  Assets:Financial Resources/Insurance; Housing; Resilience   Sleep  Sleep:No data recorded   Physical Exam: Physical Exam Vitals and nursing note reviewed.  Constitutional:      Appearance: Normal appearance.  HENT:     Head: Normocephalic and atraumatic.     Mouth/Throat:     Pharynx: Oropharynx is clear.  Eyes:     Pupils: Pupils are equal, round, and reactive to light.  Cardiovascular:     Rate and Rhythm: Normal rate and regular rhythm.  Pulmonary:     Effort: Pulmonary effort is normal.     Breath sounds: Normal breath sounds.  Abdominal:     General: Abdomen is flat.     Palpations: Abdomen is soft.  Musculoskeletal:        General: Normal range of motion.  Skin:    General: Skin is warm and dry.  Neurological:     General: No focal deficit present.     Mental Status: He is alert. Mental status is at baseline.  Psychiatric:        Attention and Perception: He is inattentive.        Mood and Affect: Affect is labile and inappropriate.        Speech: Speech is tangential.        Behavior: Behavior is agitated. Behavior is not aggressive or hyperactive.        Thought Content: Thought content normal.        Cognition and Memory: Cognition is impaired.        Judgment: Judgment is inappropriate.    Review of Systems  Constitutional: Negative.   HENT: Negative.   Eyes: Negative.   Respiratory: Negative.   Cardiovascular: Negative.    Gastrointestinal: Negative.   Musculoskeletal: Negative.   Skin: Negative.   Neurological: Negative.   Psychiatric/Behavioral: Negative for depression, hallucinations, memory loss, substance abuse and suicidal ideas. The patient is not nervous/anxious and does not have insomnia.    Blood pressure (!) 118/93, pulse 76, temperature 97.9 F (36.6 C), temperature source Oral, resp. rate 18, height 6' (1.829 m), weight 113 kg, SpO2 99 %. Body mass index is 33.79 kg/m.   Treatment Plan Summary: Plan Tried to encourage patient to see his how his behaviors were causing problems for him.  Tried to get him to articulate what is most important for him.  He continues to return frequently to standing on his belief that he just does not want to get treatment and that he feels there is nothing wrong with him.  Refuses to even consider taking any different medication meaning anything oral that he would have to comply with.  Nursing still feels that he needs some special accommodation as far as restricting his movement given how disruptive he has been with other more passive patients.  No change to orders today  Mordecai Rasmussen, MD 10/28/2020, 11:58 AM

## 2020-10-28 NOTE — Plan of Care (Signed)
  Problem: Education: Goal: Knowledge of the prescribed therapeutic regimen will improve Outcome: Progressing   Problem: Coping: Goal: Coping ability will improve Outcome: Progressing   Problem: Health Behavior/Discharge Planning: Goal: Compliance with prescribed medication regimen will improve Outcome: Progressing   

## 2020-10-28 NOTE — BHH Group Notes (Signed)
LCSW Group Therapy 10/28/20: 12:20 PM-1:00 PM    Type of Therapy and Topic:  Group Therapy:  Setting Goals   Participation Level:  Minimal    Description of Group: In this process group, patients discussed using strengths to work toward goals and address challenges.  Patients identified two positive things about themselves and one goal they were working on.  Patients were given the opportunity to share openly and support each other's plan for self-empowerment.  The group discussed the value of gratitude and were encouraged to have a daily reflection of positive characteristics or circumstances.  Patients were encouraged to identify a plan to utilize their strengths to work on current challenges and goals.   Therapeutic Goals 1. Patient will verbalize personal strengths/positive qualities and relate how these can assist with achieving desired personal goals 2. Patients will verbalize affirmation of peers plans for personal change and goal setting 3. Patients will explore the value of gratitude and positive focus as related to successful achievement of goals 4. Patients will verbalize a plan for regular reinforcement of personal positive qualities and circumstances.   Summary of Patient Progress: Patient did not participate during group and was snapping his fingers and talking to himself. CSW asked patient to leave the group due to being disruptive. Patient came back again at the end of group and CSW still had to redirect patient.        Therapeutic Modalities Cognitive Behavioral Therapy Motivational Interviewing

## 2020-10-28 NOTE — Progress Notes (Signed)
Patient was better after shift change. Less pacing and intrusiveness. He continues to deny SI  HI  AVH depression anxiety and pain at this encounter. Was willing to take medications at first, but later changed his mind stating that he did not need his medication.  He reports being part of the illuminati and continues respond to internal stimuli. He will continue to be monitored with 15 minute safety checks and was encouraged to come to nurses station if he had any concerns.     Cleo Butler-Nicholson, LPN

## 2020-10-28 NOTE — Plan of Care (Signed)
Patient pacing with hostile hand gestures and snapping fingers.When PRN medicines offered patient states " I will tell you when I need. Are you scared of me. I won't hurt anyone." Patient follows peers and stay close to them. Patient redirectable regarding that. Made security aware of this and monitoring closely.

## 2020-10-29 DIAGNOSIS — F250 Schizoaffective disorder, bipolar type: Secondary | ICD-10-CM | POA: Diagnosis not present

## 2020-10-29 MED ORDER — DIPHENHYDRAMINE HCL 50 MG/ML IJ SOLN
50.0000 mg | Freq: Three times a day (TID) | INTRAMUSCULAR | Status: DC | PRN
  Administered 2020-10-30: 50 mg via INTRAMUSCULAR
  Filled 2020-10-29: qty 1

## 2020-10-29 MED ORDER — DIPHENHYDRAMINE HCL 25 MG PO CAPS
50.0000 mg | ORAL_CAPSULE | Freq: Three times a day (TID) | ORAL | Status: DC
  Administered 2020-10-30: 50 mg via ORAL
  Filled 2020-10-29 (×2): qty 2

## 2020-10-29 MED ORDER — CHLORPROMAZINE HCL 50 MG PO TABS
75.0000 mg | ORAL_TABLET | Freq: Three times a day (TID) | ORAL | Status: DC | PRN
  Administered 2020-10-30: 75 mg via ORAL
  Filled 2020-10-29 (×2): qty 1

## 2020-10-29 MED ORDER — CHLORPROMAZINE HCL 25 MG/ML IJ SOLN
75.0000 mg | Freq: Three times a day (TID) | INTRAMUSCULAR | Status: DC | PRN
  Administered 2020-10-30 – 2020-11-01 (×3): 75 mg via INTRAMUSCULAR
  Filled 2020-10-29 (×6): qty 3

## 2020-10-29 NOTE — BHH Counselor (Signed)
CSW returned call to [facility] from [facility] [phone]). Deanna asked if pt was still at the hospital and confirmed that he remains on the wait list. No other concerns expressed. Contact ended without incident.   Vilma Meckel. Algis Greenhouse, MSW, LCSW, LCAS 10/29/2020 11:34 AM

## 2020-10-29 NOTE — [facility] Group Notes (Signed)
LCSW Group Therapy Note   10/29/2020 1:54 PM  Type of Therapy and Topic:  Group Therapy:  Overcoming Obstacles   Participation Level:  Did Not Attend   Description of Group:    In this group patients will be encouraged to explore what they see as obstacles to their own wellness and recovery. They will be guided to discuss their thoughts, feelings, and behaviors related to these obstacles. The group will process together ways to cope with barriers, with attention given to specific choices patients can make. Each patient will be challenged to identify changes they are motivated to make in order to overcome their obstacles. This group will be process-oriented, with patients participating in exploration of their own experiences as well as giving and receiving support and challenge from other group members.   Therapeutic Goals: 1. Patient will identify personal and current obstacles as they relate to admission. 2. Patient will identify barriers that currently interfere with their wellness or overcoming obstacles.  3. Patient will identify feelings, thought process and behaviors related to these barriers. 4. Patient will identify two changes they are willing to make to overcome these obstacles:      Summary of Patient Progress X    Therapeutic Modalities:   Cognitive Behavioral Therapy Solution Focused Therapy Motivational Interviewing Relapse Prevention Therapy  Simona Huh R. Algis Greenhouse, MSW, LCSW, LCAS 10/29/2020 1:54 PM
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
## 2020-10-29 NOTE — Plan of Care (Signed)
  Problem: Activity: Goal: Will verbalize the importance of balancing activity with adequate rest periods Outcome: Not Progressing   Problem: Education: Goal: Will be free of psychotic symptoms Outcome: Not Progressing Goal: Knowledge of the prescribed therapeutic regimen will improve Outcome: Not Progressing   Problem: Coping: Goal: Coping ability will improve Outcome: Not Progressing Goal: Will verbalize feelings Outcome: Not Progressing   Problem: Health Behavior/Discharge Planning: Goal: Compliance with prescribed medication regimen will improve Outcome: Not Progressing   Problem: Safety: Goal: Ability to redirect hostility and anger into socially appropriate behaviors will improve Outcome: Not Progressing Goal: Ability to remain free from injury will improve Outcome: Not Progressing   Problem: Education: Goal: Knowledge of  General Education information/materials will improve Outcome: Not Progressing Goal: Emotional status will improve Outcome: Not Progressing Goal: Mental status will improve Outcome: Not Progressing Goal: Verbalization of understanding the information provided will improve Outcome: Not Progressing

## 2020-10-29 NOTE — Progress Notes (Incomplete)
Patient continues pace the halls knocking on the glass and  making loud noises.  He is redirectable but only for short period of times before he comes back out of his room and continues with the behaviors. He is med compliant and tolerated his meds without incident. He denies SI  HI AVH depression and anxiety and continues to deny having any mental issues at all.  He contine

## 2020-10-29 NOTE — Progress Notes (Signed)
Patient continues pace the halls knocking on the glass and  making loud noises.  He is redirectable but only for short period of times before he comes back out of his room and continues with the behaviors. He is med compliant and tolerated his meds without incident. He denies SI  HI AVH depression and anxiety and continues to deny having any mental issues at all. He will continue to be monitored for safety and chang in mood.    Cleo Butler-Nicholson, LPN

## 2020-10-29 NOTE — Progress Notes (Signed)
Recreation Therapy Notes   Date: 10/29/2020  Time: 9:30 am   Location: Craft room     Behavioral response: N/A   Intervention Topic: Relaxation    Discussion/Intervention: Patient did not attend group.   Clinical Observations/Feedback:  Patient did not attend group.   Shaverence Outlaw LRT/CTRS        Shaverence  Outlaw 10/29/2020 12:14 PM

## 2020-10-29 NOTE — Progress Notes (Signed)
Patient agreed to take his medication.  Received and tolerated his meds without incident.  Encouraged to come to nurses station with any concerns. Continues to be monitored with 15 minute safety checks.   Cleo Butler-Nicholson, LPN
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
## 2020-10-30 DIAGNOSIS — F250 Schizoaffective disorder, bipolar type: Secondary | ICD-10-CM | POA: Diagnosis not present

## 2020-10-30 MED ORDER — ZIPRASIDONE MESYLATE 20 MG IM SOLR: 20.0000 mg | Freq: Once | INTRAMUSCULAR | Status: DC

## 2020-10-30 MED ORDER — LURASIDONE HCL 20 MG PO TABS: 20.0000 mg | ORAL_TABLET | Freq: Every day | ORAL | 1 refills | Status: DC

## 2020-10-30 MED ORDER — CHLORPROMAZINE HCL 50 MG PO TABS
75.0000 mg | ORAL_TABLET | Freq: Three times a day (TID) | ORAL | Status: DC | PRN
  Administered 2020-10-31: 75 mg via ORAL
  Filled 2020-10-30 (×3): qty 1

## 2020-10-30 MED ORDER — DIPHENHYDRAMINE HCL 25 MG PO CAPS
50.0000 mg | ORAL_CAPSULE | Freq: Three times a day (TID) | ORAL | Status: DC | PRN
  Administered 2020-10-31: 50 mg via ORAL
  Filled 2020-10-30: qty 2

## 2020-10-30 MED ORDER — OLANZAPINE 20 MG PO TABS: 20.0000 mg | ORAL_TABLET | Freq: Every day | ORAL | 1 refills | Status: DC

## 2020-10-30 NOTE — Progress Notes (Signed)
Patient agitated yelling about his network being corrupted and needing to protect people from being controlled. Was prompted to go back in his room but was unable to be redirected.  He agreed to PO PRN medication for anxiety and agitation in lieu of IM injections. Tolerated medication without incident.  Patient is safe on the unit with 15 minute safety rounds.  Will continue to monitor for improvement of mood.    Cleo Butler-Nicholson, LPN
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
## 2020-10-30 NOTE — [facility] Group Notes (Signed)
LCSW Group Therapy Note       10/30/2020 2:24 PM      Type of Therapy/Topic:  Group Therapy: Emotional Regulation        Participation Level:  Did not attend      Description of Group:     Milieu was in state of chaos. Group was held outside to utilize coping skills to help deal with strong emotions. Positive recreational activity and mindfulness were practiced to assist with distraction/amelioration of negative emotional outbursts and overload.        Therapeutic Goals:   1. Patient will identify positive activities to help distract/ameliorate negative emotions.   2. Patient will demonstrate positive leisure activity/mindfulness practice.       Summary of Patient Progress:  X       Therapeutic Modalities:   Mindfulness   Positive leisure
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
## 2020-10-30 NOTE — Progress Notes (Signed)
Recreation Therapy Notes   Date: 10/30/2020  Time: 2:30 pm   Location: Craft room     Behavioral response: N/A   Intervention Topic: Self-care    Discussion/Intervention: Patient did not attend group.   Clinical Observations/Feedback:  Patient did not attend group.   Antonique Langford LRT/CTRS         Nazanin Kinner 10/30/2020 4:10 PM

## 2020-10-30 NOTE — Progress Notes (Signed)
Patient at the nurses requested and was provided water. He became agitated and threw the cup with water at the door. Patient was prompted to go to his room to which he has  compiled for the moment.        Jackson Butler-Nicholson, LPN
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
## 2020-10-30 NOTE — Progress Notes (Signed)
Patient has been calm, resting in his room from 1pm to 4:30pm. He ate his dinner and asked for a cup of water. Patient is not observed to be in any distress.

## 2020-10-30 NOTE — BHH Suicide Risk Assessment (Deleted)
[facility] Discharge Suicide Risk Assessment   Principal Problem: Schizoaffective disorder, bipolar type [facility]) Discharge Diagnoses: Principal Problem:   Schizoaffective disorder, bipolar type (HCC) Active Problems:   Noncompliance   Total Time spent with patient: 35 minutes- 25 minutes face-to-face contact with patient, 10 minutes documentation, coordination of care, scripts   Musculoskeletal: Strength & Muscle Tone: within normal limits Gait & Station: normal Patient leans: N/A  Psychiatric Specialty Exam: Review of Systems  Constitutional: Positive for appetite change. Negative for fatigue.  HENT: Positive for drooling. Negative for rhinorrhea.   Eyes: Negative for photophobia and visual disturbance.  Respiratory: Negative for cough and shortness of breath.   Cardiovascular: Negative for chest pain and palpitations.  Gastrointestinal: Negative for constipation, diarrhea, nausea and vomiting.  Endocrine: Negative for cold intolerance and heat intolerance.  Genitourinary: Negative for difficulty urinating and dysuria.  Musculoskeletal: Negative for arthralgias and myalgias.  Skin: Negative for rash and wound.  Allergic/Immunologic: Negative for environmental allergies and food allergies.  Neurological: Negative for dizziness and headaches.  Hematological: Negative for adenopathy. Does not bruise/bleed easily.  Psychiatric/Behavioral: Positive for agitation, behavioral problems and hallucinations. Negative for suicidal ideas.    Blood pressure (!) 106/93, pulse 100, temperature 98.6 F (37 C), temperature source Oral, resp. rate 18, height 6' (1.829 m), weight 113 kg, SpO2 98 %.Body mass index is 33.79 kg/m.  General Appearance: Casual  Eye Contact::  Glaring  Speech:  Pressured  Volume:  Increased  Mood:  Angry  Affect:  Congruent  Thought Process:  Disorganized  Orientation:  Full (Time, Place, and Person)  Thought Content:  Illogical, Delusions and Paranoid Ideation   Suicidal Thoughts:  No  Homicidal Thoughts:  No  Memory:  Immediate;   Fair Recent;   Fair Remote;   Fair  Judgement:  Impaired  Insight:  Lacking  Psychomotor Activity:  Restlessness  Concentration:  Poor  Recall:  Poor  Fund of Knowledge:Good  Language: Fair  Akathisia:  Negative  Handed:  Right  AIMS (if indicated):     Assets:  Desire for Improvement Physical Health Resilience  Sleep:  Number of Hours: 3.5  Cognition: WNL  ADL's:  Intact   Mental Status Per Nursing Assessment::   On Admission:  NA  Demographic Factors:  Male, Living alone and Unemployed  Loss Factors: Legal issues  Historical Factors: Impulsivity  Risk Reduction Factors:   Positive social support and Positive coping skills or problem solving skills  Continued Clinical Symptoms:  Schizophrenia:   Paranoid or undifferentiated type Previous Psychiatric Diagnoses and Treatments  Cognitive Features That Contribute To Risk:  Closed-mindedness    Suicide Risk:  Minimal: No identifiable suicidal ideation.  Patients presenting with no risk factors but with morbid ruminations; may be classified as minimal risk based on the severity of the depressive symptoms    Plan Of Care/Follow-up recommendations:  Activity:  as tolerated Diet:  regular diet  [name], MD 10/30/2020, 10:27 AM

## 2020-10-31 DIAGNOSIS — F250 Schizoaffective disorder, bipolar type: Secondary | ICD-10-CM | POA: Diagnosis not present

## 2020-10-31 MED ORDER — ARIPIPRAZOLE 10 MG PO TABS
10.0000 mg | ORAL_TABLET | Freq: Every day | ORAL | Status: DC
  Administered 2020-10-31 – 2020-11-05 (×6): 10 mg via ORAL
  Filled 2020-10-31 (×6): qty 1

## 2020-10-31 MED ORDER — OLANZAPINE 10 MG IM SOLR
15.0000 mg | Freq: Every day | INTRAMUSCULAR | Status: DC
  Administered 2020-11-01 – 2020-11-12 (×6): 15 mg via INTRAMUSCULAR
  Filled 2020-10-31 (×6): qty 20

## 2020-10-31 MED ORDER — OLANZAPINE 5 MG PO TBDP
30.0000 mg | ORAL_TABLET | Freq: Every day | ORAL | Status: DC
  Administered 2020-10-31 – 2020-11-12 (×7): 30 mg via ORAL
  Filled 2020-10-31 (×7): qty 6

## 2020-10-31 NOTE — BHH Group Notes (Signed)
LCSW Group Therapy Note  10/31/2020 2:09 PM  Type of Therapy/Topic:  Group Therapy:  Emotion Regulation  Participation Level:  Did Not Attend   Description of Group:   The purpose of this group is to assist patients in learning to regulate negative emotions and experience positive emotions. Patients will be guided to discuss ways in which they have been vulnerable to their negative emotions. These vulnerabilities will be juxtaposed with experiences of positive emotions or situations, and patients will be challenged to use positive emotions to combat negative ones. Special emphasis will be placed on coping with negative emotions in conflict situations, and patients will process healthy conflict resolution skills.  Therapeutic Goals: 1. Patient will identify two positive emotions or experiences to reflect on in order to balance out negative emotions 2. Patient will label two or more emotions that they find the most difficult to experience 3. Patient will demonstrate positive conflict resolution skills through discussion and/or role plays  Summary of Patient Progress: X  Therapeutic Modalities:   Cognitive Behavioral Therapy Feelings Identification Dialectical Behavioral Therapy  Linn Clavin R. Shaia Porath, MSW, LCSW, LCAS 10/31/2020 2:09 PM   
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
## 2020-10-31 NOTE — Progress Notes (Signed)
Continued on 1:1 no distress noted will continue to monitor closely.

## 2020-10-31 NOTE — Progress Notes (Signed)
Patient alert x 2 with period of confusion to place and situation, he was not receptive to staff, thoughts are disorganized and incoherent, he appears responding to internal stimuli, he wouldn't make eye contact, he was not redirectable, he was argumentative using profanities and upset with staff, he was medicated with the PRN orders as needed for agitation and restlessness. Patient was continued on 1:1 will continue to monitor closely.
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
## 2020-10-31 NOTE — Progress Notes (Signed)
Patient is resting in bed. No signs of distress noted. Patient's lunch tray was left for him.

## 2020-10-31 NOTE — Progress Notes (Signed)
Patient alert no distress noted, continued on 1:1, 15 minutes safety checks maintained will continue to monitor.

## 2020-10-31 NOTE — Plan of Care (Signed)
°  Problem: Group Participation °Goal: STG - Patient will engage in groups with a calm and appropriate mood at least 2x within 5 recreation therapy group [place] °Description: STG - Patient will engage in groups with a calm and appropriate mood at least 2x within 5 recreation therapy group [place] °Outcome: Not Progressing °  °

## 2020-10-31 NOTE — Progress Notes (Signed)
Patient was brought dinner tray. Patient acknowledged he did not fill out his menu, which is why he did not get the food he wanted. Patient began snapping and whistling and talking about "infinity and gravity holding no weight". Patient continues to be on 1:1. No signs of distress noted.

## 2020-10-31 NOTE — Progress Notes (Signed)
Patient is alert and cooperative this morning. Patient knocked on the doors stating his breakfast order was wrong, but showed no signs of aggression. New breakfast tray was ordered. Patient remains resting in bed and no signs of distress are observed.
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
## 2020-10-31 NOTE — Progress Notes (Signed)
Patient is awake and eating lunch. He is animated, loud, and hyperverbal. Patient refused vital signs. No signs of distress or aggression noted.

## 2020-11-01 DIAGNOSIS — F250 Schizoaffective disorder, bipolar type: Secondary | ICD-10-CM | POA: Diagnosis not present

## 2020-11-01 MED ORDER — DIPHENHYDRAMINE HCL 50 MG/ML IJ SOLN
50.0000 mg | Freq: Three times a day (TID) | INTRAMUSCULAR | Status: DC | PRN
  Administered 2020-11-06 – 2020-11-10 (×6): 50 mg via INTRAMUSCULAR
  Filled 2020-11-01 (×7): qty 1

## 2020-11-01 MED ORDER — ZIPRASIDONE MESYLATE 20 MG IM SOLR: 20.0000 mg | Freq: Once | INTRAMUSCULAR | Status: DC

## 2020-11-01 MED ORDER — DIPHENHYDRAMINE HCL 25 MG PO CAPS
50.0000 mg | ORAL_CAPSULE | Freq: Three times a day (TID) | ORAL | Status: DC | PRN
Start: 2020-11-01 — End: 2020-11-10
  Administered 2020-11-02: 50 mg via ORAL
  Filled 2020-11-01 (×2): qty 2

## 2020-11-01 MED ORDER — CHLORPROMAZINE HCL 100 MG PO TABS
100.0000 mg | ORAL_TABLET | Freq: Three times a day (TID) | ORAL | Status: DC | PRN
Start: 2020-11-01 — End: 2020-11-10
  Administered 2020-11-02: 100 mg via ORAL
  Filled 2020-11-01 (×2): qty 1

## 2020-11-01 MED ORDER — CHLORPROMAZINE HCL 25 MG/ML IJ SOLN
100.0000 mg | Freq: Three times a day (TID) | INTRAMUSCULAR | Status: DC | PRN
  Administered 2020-11-01 – 2020-11-10 (×7): 100 mg via INTRAMUSCULAR
  Filled 2020-11-01 (×7): qty 4

## 2020-11-01 NOTE — Progress Notes (Signed)
Patient has better behaviors this evening. No aggression or outburst to note so far.  Took his medications without incident. Denies SI  HI AVH depression anxiety or pain at this encounter.  Continues with snapping fingers twirling in the hall and whistling.  Remains on 1:1   Cleo Butler-Nicholson, LPN

## 2020-11-01 NOTE — [facility] Counselor (Addendum)
CSW was contacted by Amor from [facility]. He asked that update from today be sent regarding pt placement on priority list. Amor asked that it be faxed and informed CSW that he would review it and call the CSW back. No other concerns expressed. Contact ended without incident.   CSW faxed requested information over to [facility] at [phone]. CSW called back over to confirm receipt of fax. Deanna at [facility] confirmed receipt.   Vilma Meckel. Algis Greenhouse, MSW, LCSW, LCAS 11/01/2020 11:13 AM  CSW returned call to Amor at [facility]. He states that pt is currently on the priority list, however, he explains that this list fluctuates based on pt behavior. He shares that ultimately the provider is the one who can decide upon an admission and that he does not know when a bed will be available. No other concerns expressed. Contact ended without incident.   Vilma Meckel. Algis Greenhouse, MSW, LCSW, LCAS 11/01/2020 11:40 AM
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
## 2020-11-01 NOTE — Tx Team (Signed)
Interdisciplinary Treatment and Diagnostic Plan Update  11/01/2020 Time of Session: 8:30 AM  Jackson [name] MRN: [id number]  Principal Diagnosis: Schizoaffective disorder, bipolar type (HCC)  Secondary Diagnoses: Principal Problem:   Schizoaffective disorder, bipolar type (HCC) Active Problems:   Noncompliance   Current Medications:  Current Facility-Administered Medications  Medication Dose Route Frequency Provider Last Rate Last Admin  . acetaminophen (TYLENOL) tablet 650 mg  650 mg Oral Q6H PRN [name], [name], MD      . alum & mag hydroxide-simeth (MAALOX/MYLANTA) 200-200-20 MG/5ML suspension 30 mL  30 mL Oral Q4H PRN [name], [name], MD      . ARIPiprazole (ABILIFY) tablet 10 mg  10 mg Oral Daily [name], MD   10 mg at 11/01/20 0912  . chlorproMAZINE (THORAZINE) injection 75 mg  75 mg Intramuscular TID PRN [name], MD   75 mg at 11/01/20 0940   And  . diphenhydrAMINE (BENADRYL) injection 50 mg  50 mg Intramuscular TID PRN [name], MD   50 mg at 10/30/20 2123  . diphenhydrAMINE (BENADRYL) capsule 50 mg  50 mg Oral TID PRN [name], MD   50 mg at 10/31/20 1419   And  . chlorproMAZINE (THORAZINE) tablet 75 mg  75 mg Oral TID PRN [name], MD   75 mg at 10/31/20 1419  . hydrALAZINE (APRESOLINE) tablet 10 mg  10 mg Oral Q6H PRN [name], MD      . hydrOXYzine (ATARAX/VISTARIL) tablet 50 mg  50 mg Oral TID PRN [name], [name], MD   50 mg at 10/31/20 2101  . magnesium hydroxide (MILK OF MAGNESIA) suspension 30 mL  30 mL Oral Daily PRN [name], [name], MD      . OLANZapine zydis (ZYPREXA) disintegrating tablet 30 mg  30 mg Oral QHS [name], MD   30 mg at 10/31/20 2101   Or  . OLANZapine (ZYPREXA) injection 15 mg  15 mg Intramuscular QHS [name], MD      . ziprasidone (GEODON) injection 20 mg  20 mg Intramuscular Once [name], MD       PTA Medications: Medications Prior to Admission  Medication Sig Dispense  Refill Last Dose  . [DISCONTINUED] lurasidone (LATUDA) 20 MG TABS tablet Take 1 tablet (20 mg total) by mouth daily with supper. (Patient not taking: Reported on 10/17/2020) 30 tablet 1   . [DISCONTINUED] OLANZapine (ZYPREXA) 20 MG tablet Take 1 tablet (20 mg total) by mouth at bedtime. (Patient not taking: Reported on 10/17/2020) 30 tablet 1     Patient Stressors: Medication change or noncompliance  Patient Strengths: Wellsite geologist fund of knowledge Supportive family/friends  Treatment Modalities: Medication Management, Group therapy, Case management,  1 to 1 session with clinician, Psychoeducation, Recreational therapy.   Physician Treatment Plan for Primary Diagnosis: Schizoaffective disorder, bipolar type (HCC) Long Term Goal(s): Improvement in symptoms so as ready for discharge Improvement in symptoms so as ready for discharge   Short Term Goals:    Medication Management: Evaluate patient's response, side effects, and tolerance of medication regimen.  Therapeutic Interventions: 1 to 1 [place], Unit Group [place] and Medication administration.  Evaluation of Outcomes: Not Progressing  Physician Treatment Plan for Secondary Diagnosis: Principal Problem:   Schizoaffective disorder, bipolar type (HCC) Active Problems:   Noncompliance  Long Term Goal(s): Improvement in symptoms so as ready for discharge Improvement in symptoms so as ready for discharge   Short Term Goals:  Medication Management: Evaluate patient's response, side effects, and tolerance of medication regimen.  Therapeutic Interventions: 1 to 1 [place], Unit Group [place] and Medication administration.  Evaluation of Outcomes: Not Progressing   RN Treatment Plan for Primary Diagnosis: Schizoaffective disorder, bipolar type (HCC) Long Term Goal(s): Knowledge of disease and therapeutic regimen to maintain health will improve  Short Term Goals: Ability to remain free from injury will  improve, Ability to verbalize frustration and anger appropriately will improve, Ability to demonstrate self-control, Ability to participate in decision making will improve, Ability to verbalize feelings will improve, Ability to identify and develop effective coping behaviors will improve and Compliance with prescribed medications will improve  Medication Management: RN will administer medications as ordered by provider, will assess and evaluate patient's response and provide education to patient for prescribed medication. RN will report any adverse and/or side effects to prescribing provider.  Therapeutic Interventions: 1 on 1 counseling [place], Psychoeducation, Medication administration, Evaluate responses to treatment, Monitor vital signs and CBGs as ordered, Perform/monitor CIWA, COWS, AIMS and Fall Risk screenings as ordered, Perform wound care treatments as ordered.  Evaluation of Outcomes: Not Progressing   LCSW Treatment Plan for Primary Diagnosis: Schizoaffective disorder, bipolar type (HCC) Long Term Goal(s): Safe transition to appropriate next level of care at discharge, Engage patient in therapeutic group addressing interpersonal concerns.  Short Term Goals: Engage patient in aftercare planning with referrals and resources, Increase ability to appropriately verbalize feelings, Increase emotional regulation, Facilitate acceptance of mental health diagnosis and concerns, Identify triggers associated with mental health/substance abuse issues and Increase skills for wellness and recovery  Therapeutic Interventions: Assess for all discharge needs, 1 to 1 time with Social worker, Explore available resources and support systems, Assess for adequacy in community support network, Educate family and significant other(s) on suicide prevention, Complete Psychosocial Assessment, Interpersonal group therapy.  Evaluation of Outcomes: Not Progressing   Progress in Treatment: Attending groups:  No. Participating in groups: No. Taking medication as prescribed: No. Toleration medication: No. Family/Significant other contact made: No, will contact:  Patient declined  Patient understands diagnosis: Yes. Discussing patient identified problems/goals with staff: No. Medical problems stabilized or resolved: Yes. Denies suicidal/homicidal ideation: Yes. Issues/concerns per patient self-inventory: No. Other: None  New problem(s) identified: No, Describe:  None  New Short Term/Long Term Goal(s): Elimination of symptoms of psychosis, medication management for mood stabilization; development of comprehensive mental wellness plan. Update 10/27/20: No changes at this time. Update 11/01/20: No changes at this time.   Patient Goals: Patient declined to participate in treatment team meeting despite invitation by nursing staff. Update: 10/27/20 No changes at this time. Update 11/01/20: Pt continues to be aggressive and to have forced medication.  Discharge Plan or Barriers: CSW will assist with development of an appropriate discharge/aftercare plan. Update: 10/27/20 No changes at this time. Update 11/01/20: Pt has been referred to [facility], however, he was on the wait list when they were contacted on 10/31/20. CSW will follow up regarding this today.  Reason for Continuation of Hospitalization: Aggression Delusions  Hallucinations Medication stabilization  Estimated Length of Stay: TBD  Attendees: Patient: 11/01/2020 10:37 AM  Physician: Les Pou, MD 11/01/2020 10:37 AM  Nursing:  11/01/2020 10:37 AM  RN Care Manager: 11/01/2020 10:37 AM  Social Worker: Vilma Meckel. Algis Greenhouse, MSW, [place], LCAS 11/01/2020 10:37 AM  Recreational Therapist:  11/01/2020 10:37 AM  Other: Kiva [place], MSW, LCSW-A 11/01/2020 10:37 AM  Other:  11/01/2020 10:37 AM  Other: 11/01/2020 10:37 AM    Scribe  for Treatment Team: Glenis Smoker, [place] 11/01/2020 10:37 AM

## 2020-11-01 NOTE — [facility] Group Notes (Signed)
LCSW Group Therapy Note     11/01/2020 1:53 PM     Type of Therapy/Topic:  Group Therapy:  Balance in Life     Participation Level:  Did Not Attend     Description of Group:    This group will address the concept of balance and how it feels and looks when one is unbalanced. Patients will be encouraged to process areas in their lives that are out of balance and identify reasons for remaining unbalanced. Facilitators will guide patients in utilizing problem-solving interventions to address and correct the stressor making their life unbalanced. Understanding and applying boundaries will be explored and addressed for obtaining and maintaining a balanced life. Patients will be encouraged to explore ways to assertively make their unbalanced needs known to significant others in their lives, using other group members and facilitator for support and feedback.     Therapeutic Goals:  1.      Patient will identify two or more emotions or situations they have that consume much of in their lives.  2.      Patient will identify signs/triggers that life has become out of balance:  3.      Patient will identify two ways to set boundaries in order to achieve balance in their lives:  4.      Patient will demonstrate ability to communicate their needs through discussion and/or role plays     Summary of Patient Progress:  X    Therapeutic Modalities:   Cognitive Behavioral Therapy  Solution-Focused Therapy  Assertiveness Training     Jakavion Bilodeau [place] MSW, LCSW-A  11/01/2020 1:53 PM

## 2020-11-01 NOTE — Progress Notes (Signed)
Recreation Therapy Notes  Date: 11/01/2020   Time: 10:00 am   Location: Craft room     Behavioral response: N/A   Intervention Topic: Leisure    Discussion/Intervention: Patient did not attend group.   Clinical Observations/Feedback:  Patient did not attend group.   Camary Sosa LRT/CTRS        Blaize Nipper 11/01/2020 10:18 AM
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
## 2020-11-01 NOTE — Plan of Care (Signed)
  Problem: Coping: Goal: Coping ability will improve Outcome: Progressing   Problem: Role Relationship: Goal: Ability to communicate needs accurately will improve Outcome: Progressing Goal: Ability to interact with others will improve Outcome: Progressing   Problem: Education: Goal: Mental status will improve Outcome: Progressing

## 2020-11-02 DIAGNOSIS — F250 Schizoaffective disorder, bipolar type: Secondary | ICD-10-CM | POA: Diagnosis not present

## 2020-11-02 NOTE — Progress Notes (Addendum)
Patient was loudly banging on doors asking for breakfast. He is agitated and continues to whistle and snap his fingers. Patient agreed to take his oral medications, saying "I don't have a choice do I". Patient was given Abilify and PRN Thorazine for agitation. Patient refused vital signs. Patient remains on 1:1.

## 2020-11-02 NOTE — Progress Notes (Signed)
Patient asked for shower supplies. When bringing them to him, he began whistling and talking about his "roommate".

## 2020-11-02 NOTE — Progress Notes (Signed)
Recreation Therapy Notes    Date: 11/02/2020  Time: 9:30 am   Location: Court yard     Behavioral response: N/A   Intervention Topic: Social- Skills    Discussion/Intervention: Patient did not attend group.   Clinical Observations/Feedback:  Patient did not attend group.   Mozell Hardacre LRT/CTRS        Marquay Kruse 11/02/2020 11:15 AM

## 2020-11-02 NOTE — Progress Notes (Signed)
Patient alert x 3 with period of confusion to situation, he was not receptive to staff, thoughts are disorganized and incoherent at times, speech is tangential, he appears responding to internal stimuli, he made eye contact and was easily redirectable, he was sometimes argumentative using profanities and upset with staff, he was medicated with the PRN orders as needed for agitation and restlessness. Patient is currently on 1:1 will continue to monitor closely.

## 2020-11-02 NOTE — Progress Notes (Signed)
Patient came to doors asking for dinner. Patient was unhappy with meal order but accepted it. Patient is not observed to be in any distress and remains on 1:1.
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
## 2020-11-02 NOTE — [facility] Group Notes (Signed)
LCSW Group Therapy Note  11/02/2020 2:11 PM  Type of Therapy and Topic:  Group Therapy:  Feelings around Relapse and Recovery  Participation Level:  Did Not Attend   Description of Group:    Patients in this group will discuss emotions they experience before and after a relapse. They will process how experiencing these feelings, or avoidance of experiencing them, relates to having a relapse. Facilitator will guide patients to explore emotions they have related to recovery. Patients will be encouraged to process which emotions are more powerful. They will be guided to discuss the emotional reaction significant others in their lives may have to their relapse or recovery. Patients will be assisted in exploring ways to respond to the emotions of others without this contributing to a relapse.  Therapeutic Goals: 1. Patient will identify two or more emotions that lead to a relapse for them 2. Patient will identify two emotions that result when they relapse 3. Patient will identify two emotions related to recovery 4. Patient will demonstrate ability to communicate their needs through discussion and/or role plays   Summary of Patient Progress: X  Therapeutic Modalities:   Cognitive Behavioral Therapy Solution-Focused Therapy Assertiveness Training Relapse Prevention Therapy   Simona Huh R. Algis Greenhouse, MSW, LCSW, LCAS 11/02/2020 2:11 PM

## 2020-11-02 NOTE — Progress Notes (Signed)
Patient received his lunch tray and returned to his room to eat. He showed no signs of distress and remains on 1:1.

## 2020-11-03 DIAGNOSIS — F250 Schizoaffective disorder, bipolar type: Secondary | ICD-10-CM | POA: Diagnosis not present

## 2020-11-03 NOTE — Plan of Care (Signed)
  Problem: Activity: Goal: Will verbalize the importance of balancing activity with adequate rest periods Outcome: Progressing   Problem: Education: Goal: Will be free of psychotic symptoms Outcome: Progressing Goal: Knowledge of the prescribed therapeutic regimen will improve Outcome: Progressing   Problem: Coping: Goal: Coping ability will improve Outcome: Progressing Goal: Will verbalize feelings Outcome: Progressing   Problem: Health Behavior/Discharge Planning: Goal: Compliance with prescribed medication regimen will improve Outcome: Progressing   Problem: Nutritional: Goal: Ability to achieve adequate nutritional intake will improve Outcome: Progressing   Problem: Role Relationship: Goal: Ability to communicate needs accurately will improve Outcome: Progressing Goal: Ability to interact with others will improve Outcome: Progressing   Problem: Safety: Goal: Ability to redirect hostility and anger into socially appropriate behaviors will improve Outcome: Progressing Goal: Ability to remain free from injury will improve Outcome: Progressing   Problem: Self-Care: Goal: Ability to participate in self-care as condition permits will improve Outcome: Progressing   Problem: Self-Concept: Goal: Will verbalize positive feelings about self Outcome: Progressing   Problem: Education: Goal: Knowledge of [facility] Education information/materials will improve Outcome: Progressing Goal: Emotional status will improve Outcome: Progressing Goal: Mental status will improve Outcome: Progressing Goal: Verbalization of understanding the information provided will improve Outcome: Progressing   Problem: Activity: Goal: Interest or engagement in activities will improve Outcome: Progressing Goal: Sleeping patterns will improve Outcome: Progressing   Problem: Coping: Goal: Ability to verbalize frustrations and anger appropriately will improve Outcome:  Progressing Goal: Ability to demonstrate self-control will improve Outcome: Progressing   Problem: Health Behavior/Discharge Planning: Goal: Identification of resources available to assist in meeting health care needs will improve Outcome: Progressing Goal: Compliance with treatment plan for underlying cause of condition will improve Outcome: Progressing   Problem: Physical Regulation: Goal: Ability to maintain clinical measurements within normal limits will improve Outcome: Progressing   Problem: Safety: Goal: Periods of time without injury will increase Outcome: Progressing   Problem: Education: Goal: Ability to state activities that reduce stress will improve Outcome: Progressing   Problem: Coping: Goal: Ability to identify and develop effective coping behavior will improve Outcome: Progressing   Problem: Self-Concept: Goal: Ability to identify factors that promote anxiety will improve Outcome: Progressing Goal: Level of anxiety will decrease Outcome: Progressing Goal: Ability to modify response to factors that promote anxiety will improve Outcome: Progressing   Problem: Safety: Goal: Violent Restraint(s) Outcome: Progressing

## 2020-11-03 NOTE — Progress Notes (Addendum)
1:1 observation 1900-2300 Patient in his room asking to shower. The water in the shower in his room will not get warm so patient escorted to a different room with working warm water in order to shower. Continues to talk about his "roommate". Medication compliant. Continuing to whistle at times 2300-0200 Resting in bed with eyes closed 0200-0500 Resting in bed with eyes closed 0500-0700 Resting in room.

## 2020-11-03 NOTE — Progress Notes (Signed)
[facility] MD Progress Note  11/03/2020 1:37 PM Grey Wymer  MRN:  [id number]  Principal Problem: Schizoaffective disorder, bipolar type (HCC) Diagnosis: Principal Problem:   Schizoaffective disorder, bipolar type (HCC) Active Problems:   Noncompliance  Total Time spent with patient: 20 min  35yo M, who presents to [facility] inpatient psych unit due to exacerbation of Schizoaffective disorder, particularly bizarre behavior with agitation in settings of medication noncompliance.  Patient seen, chart reviewed. Patient seen in the presence of security guard for safety. Patient is in restricted area of the unit. He is calm, cooperative with care today, his behavior is appropriate.  SUBJECTIVE: Patiernt reports "I am good". He says "I am here for nothing again".  He reports his mood is "good today". He denies feeling depressed, anxious, although he is angry that he has been admitted. He reports poor night sleep. He denies any mental or physical complaints. He deniesthoughts of harming self or others. He denies auditory or visual hallucinations. He says "there is nothing to report" re reasons for current admission. He admits medication compliance here. He denies any side effects from his medications. He states he does not believe he needs medications, but is "playing rules". Patient suddenly stopped answering questions and switched to whistling a song.   Past Psychiatric History: see H&P  Past Medical History:  Past Medical History:  Diagnosis Date  . Bipolar 1 disorder (HCC)   . Schizoaffective disorder (HCC)    History reviewed. No pertinent surgical history. Family History: History reviewed. No pertinent family history. Family Psychiatric  History: see H&P Social History:  Social History   Substance and Sexual Activity  Alcohol Use Not Currently     Social History   Substance and Sexual Activity  Drug Use Not Currently    Social History   Socioeconomic History  . Marital status:  Single    Spouse name: Not on file  . Number of children: Not on file  . Years of education: Not on file  . Highest education level: Not on file  Occupational History  . Not on file  Tobacco Use  . Smoking status: Light Tobacco Smoker    Packs/day: 0.25    Types: Cigarettes  . Smokeless tobacco: Never Used  Vaping Use  . Vaping Use: Never used  Substance and Sexual Activity  . Alcohol use: Not Currently  . Drug use: Not Currently  . Sexual activity: Not on file  Other Topics Concern  . Not on file  Social History Narrative  . Not on file   Social Determinants of Health   Financial Resource Strain: Not on file  Food Insecurity: Not on file  Transportation Needs: Not on file  Physical Activity: Not on file  Stress: Not on file  Social Connections: Not on file   Additional Social History:     Sleep: poor  Appetite:  Good  Current Medications: Current Facility-Administered Medications  Medication Dose Route Frequency Provider Last Rate Last Admin  . acetaminophen (TYLENOL) tablet 650 mg  650 mg Oral Q6H PRN [name], [name], MD      . alum & mag hydroxide-simeth (MAALOX/MYLANTA) 200-200-20 MG/5ML suspension 30 mL  30 mL Oral Q4H PRN [name], [name], MD      . ARIPiprazole (ABILIFY) tablet 10 mg  10 mg Oral Daily [name], MD   10 mg at 11/03/20 0839  . chlorproMAZINE (THORAZINE) injection 100 mg  100 mg Intramuscular TID PRN [name], MD   100 mg at  11/01/20 2150   And  . diphenhydrAMINE (BENADRYL) injection 50 mg  50 mg Intramuscular TID PRN [name], MD      . chlorproMAZINE (THORAZINE) tablet 100 mg  100 mg Oral TID PRN [name], MD   100 mg at 11/02/20 2706   And  . diphenhydrAMINE (BENADRYL) capsule 50 mg  50 mg Oral TID PRN [name], MD   50 mg at 11/02/20 2376  . hydrALAZINE (APRESOLINE) tablet 10 mg  10 mg Oral Q6H PRN [name], MD      . hydrOXYzine (ATARAX/VISTARIL) tablet 50 mg  50 mg Oral TID PRN [name], [name], MD   50 mg at 11/03/20 0839  . magnesium hydroxide (MILK OF MAGNESIA) suspension 30 mL  30 mL Oral Daily PRN [name], [name], MD      . OLANZapine zydis (ZYPREXA) disintegrating tablet 30 mg  30 mg Oral QHS [name], MD   30 mg at 11/02/20 2102   Or  . OLANZapine (ZYPREXA) injection 15 mg  15 mg Intramuscular QHS [name], MD   15 mg at 11/01/20 2151    Lab Results: No results found for this or any previous visit (from the past 48 hour(s)).  Blood Alcohol level:  Lab Results  Component Value Date   ETH <10 10/17/2020   ETH <10 08/11/2020    Metabolic Disorder Labs: Lab Results  Component Value Date   HGBA1C 5.2 10/23/2018   MPG 102.54 10/23/2018   No results found for: PROLACTIN Lab Results  Component Value Date   CHOL 129 10/23/2018   TRIG 58 10/23/2018   HDL 50 10/23/2018   CHOLHDL 2.6 10/23/2018   VLDL 12 10/23/2018   LDLCALC 67 10/23/2018    Physical Findings: AIMS: Facial and Oral Movements Muscles of Facial Expression: None, normal Lips and Perioral Area: None, normal Jaw: None, normal Tongue: None, normal,Extremity Movements Upper (arms, wrists, hands, fingers): None, normal Lower (legs, knees, ankles, toes): None, normal, Trunk Movements Neck, shoulders, hips: None, normal, Overall Severity Severity of abnormal movements (highest score from questions above): None, normal Incapacitation due to abnormal movements: None, normal Patient's awareness of abnormal movements (rate only patient's report): No Awareness, Dental Status Current problems with teeth and/or dentures?: No Does patient usually wear dentures?: No  CIWA:    COWS:       Musculoskeletal: Strength & Muscle Tone: within normal limits Gait & Station: normal Patient leans: N/A  Psychiatric Specialty Exam: Physical Exam  ROS  Blood pressure (!) 106/93, pulse 100, temperature 98.6 F (37 C), temperature source Oral, resp. rate 18, height 6' (1.829 m), weight 113 kg, SpO2 98  %.  General Appearance: Casual  Eye Contact:  poor  Speech:  Normal Rate, whistling  Volume:  Normal  Mood:  Euthymic  Affect:  Guarded  Thought Process:  appears Coherent  Orientation:  Full (Time, Place, and Person)  Thought Content:  illogical, disorganized, delusional  Suicidal Thoughts:  No  Homicidal Thoughts:  No  Memory:  Immediate;   Fair Recent;   Fair Remote;   Fair  Judgement:  Poor  Insight:  No  Psychomotor Activity:  Normal  Concentration:  Concentration: Fair and Attention Span: Fair  Recall:  [employer] of Knowledge:  Fair  Language:  Fair  Akathisia:  No  Handed:  Right  AIMS (if indicated):     Assets:  Architect Physical Health Resilience Social Support  ADL's:  Intact  Cognition:  WNL  Sleep:     Treatment Plan Summary: Daily contact with patient to assess and evaluate symptoms and progress in treatment   35yo M, who presents to [facility] inpatient psych unit due to exacerbation of Schizoaffective disorder, particularly bizarre behavior with agitation in settings of medication noncompliance.  Today patient is calm and partially-cooperative, he was compliant with oral medications and his behavior was appropriate so far today. He continues to appear delusional with poor insight and judgement . Will not make medication changes today. Will continue to offer LAI medication.  Impression: Schizoaffective disorder, bipolar type, current episode - acute mania.  Plan: -continue inpatient psych admission; 15-minute checks; daily contact with patient to assess and evaluate symptoms and progress in treatment; psychoeducation.  -continue scheduled psych medications: . ARIPiprazole  10 mg Oral Daily  . OLANZapine zydis  30 mg Oral QHS   Or  . OLANZapine  15 mg Intramuscular QHS     -continue PRN medications. acetaminophen, alum & mag hydroxide-simeth, chlorproMAZINE (THORAZINE) injection **AND**  diphenhydrAMINE, chlorproMAZINE **AND** diphenhydrAMINE, hydrALAZINE, hydrOXYzine, magnesium hydroxide  -Disposition: to be determined. Social Work [employer] on discharge planning for as soon as patient shows some improvement. He is not ready for d/c yet. He will likely needed group home placement.    Thalia Party, MD 11/03/2020, 1:37 PM

## 2020-11-03 NOTE — Progress Notes (Signed)
1:1 observation 1900-2300 Patient showered outside of his room because the water in his room does not get warm. He appears to be fighting something that is not there. He says he is engaging in "spiritual warfare". Medication compliant. Given snack. Voices no complaints. 2300-0200 Patient up asking for something for heartburn. No further complaints. Patient will motion male staff away and prefers for male staff only to bring him things, referring to male staff as "masons" and saying "masons and illuminati don't get along". 0200-0500 Up once asking for extra blankets and scrubs. 0500-0700 Resting in bed without complaints

## 2020-11-03 NOTE — Progress Notes (Signed)
[facility] Group Notes: (Clinical Social Work)   11/03/2020      Type of Therapy:  Group Therapy   Participation Level:  Did Not Attend - was invited individually by Nurse/MHT and chose not to attend.  Unable to attend due to behaviors.    Susa Simmonds, LCSWA 11/03/2020  3:10 PM

## 2020-11-03 NOTE — Plan of Care (Signed)
  Problem: Activity: Goal: Will verbalize the importance of balancing activity with adequate rest periods Outcome: Not Progressing   Problem: Education: Goal: Will be free of psychotic symptoms Outcome: Not Progressing Goal: Knowledge of the prescribed therapeutic regimen will improve Outcome: Not Progressing   Problem: Coping: Goal: Coping ability will improve Outcome: Not Progressing Goal: Will verbalize feelings Outcome: Not Progressing   Problem: Health Behavior/Discharge Planning: Goal: Compliance with prescribed medication regimen will improve Outcome: Not Progressing   Problem: Nutritional: Goal: Ability to achieve adequate nutritional intake will improve Outcome: Not Progressing   Problem: Role Relationship: Goal: Ability to communicate needs accurately will improve Outcome: Not Progressing Goal: Ability to interact with others will improve Outcome: Not Progressing   Problem: Safety: Goal: Ability to redirect hostility and anger into socially appropriate behaviors will improve Outcome: Not Progressing Goal: Ability to remain free from injury will improve Outcome: Not Progressing   Problem: Self-Care: Goal: Ability to participate in self-care as condition permits will improve Outcome: Not Progressing   Problem: Self-Concept: Goal: Will verbalize positive feelings about self Outcome: Not Progressing   Problem: Education: Goal: Knowledge of [facility] Education information/materials will improve Outcome: Not Progressing Goal: Emotional status will improve Outcome: Not Progressing Goal: Mental status will improve Outcome: Not Progressing Goal: Verbalization of understanding the information provided will improve Outcome: Not Progressing   Problem: Activity: Goal: Interest or engagement in activities will improve Outcome: Not Progressing Goal: Sleeping patterns will improve Outcome: Not Progressing   Problem: Coping: Goal: Ability to verbalize  frustrations and anger appropriately will improve Outcome: Not Progressing Goal: Ability to demonstrate self-control will improve Outcome: Not Progressing   Problem: Health Behavior/Discharge Planning: Goal: Identification of resources available to assist in meeting health care needs will improve Outcome: Not Progressing Goal: Compliance with treatment plan for underlying cause of condition will improve Outcome: Not Progressing   Problem: Physical Regulation: Goal: Ability to maintain clinical measurements within normal limits will improve Outcome: Not Progressing   Problem: Safety: Goal: Periods of time without injury will increase Outcome: Not Progressing   Problem: Education: Goal: Ability to state activities that reduce stress will improve Outcome: Not Progressing   Problem: Coping: Goal: Ability to identify and develop effective coping behavior will improve Outcome: Not Progressing   Problem: Self-Concept: Goal: Ability to identify factors that promote anxiety will improve Outcome: Not Progressing Goal: Level of anxiety will decrease Outcome: Not Progressing Goal: Ability to modify response to factors that promote anxiety will improve Outcome: Not Progressing   Problem: Safety: Goal: Violent Restraint(s) Outcome: Not Progressing

## 2020-11-03 NOTE — Progress Notes (Signed)
Patient spent the AM in room and was calm/cooperative. Became more and more restless toward the afternoon but had no aggressive behaviors. Was agitated at dinnertime reporting that he did not get the dinner that he usually orders (burger). Dinning was contacted and patient received his wanted dinner. Currently in room, calm with no sign of discomfort. Safety monitored as expected.

## 2020-11-03 NOTE — Plan of Care (Signed)
Jackson [name] has been calm and cooperative and stayed in room. Thought process is clear and organized. Patient took his medications voluntarily and had no concerns. Was encouraged to talk to staff as needed.

## 2020-11-04 DIAGNOSIS — F250 Schizoaffective disorder, bipolar type: Secondary | ICD-10-CM | POA: Diagnosis not present

## 2020-11-04 NOTE — [facility] Group Notes (Signed)
[facility] Group Notes:  (Nursing/MHT/Case Management/Adjunct)  Date:  11/04/2020  Time:  8:36 PM  Type of Therapy:  Group Therapy  Participation Level:  Did Not Attend   Jackson [name] 11/04/2020, 8:36 PM

## 2020-11-04 NOTE — Plan of Care (Signed)
Patient is irritable this morning. Restless and verbally aggressive to staff. Pacing and resistant to care: refused vital signs and yelled at staff.  Received medications per encouragements. Ate breakfast and was encouraged to perform hygiene. Safety precautions reinforced.

## 2020-11-04 NOTE — Progress Notes (Signed)
[facility] MD Progress Note  11/04/2020 10:43 AM Jackson [name]  MRN:  [id number]  Principal Problem: Schizoaffective disorder, bipolar type (HCC) Diagnosis: Principal Problem:   Schizoaffective disorder, bipolar type (HCC) Active Problems:   Noncompliance  Total Time spent with patient: 20 min  35yo M, who presents to [facility] inpatient psych unit due to exacerbation of Schizoaffective disorder, particularly bizarre behavior with agitation in settings of medication noncompliance.  Patient seen, chart reviewed. Patient seen in the presence of security guard for safety. Patient is in restricted area of the unit. He was irritable this morning, verbally aggressive to staff, pacing and resistant to care, refused vital signs.   SUBJECTIVE: Patiernt reports "I am fine. I want to get out of this area. I can keep my calm". He reports his mood is "okay", denies feeling depressed, anxious. He denies any mental or physical complaints. Keeps saying that he soul not being admitted here. He deniesthoughts of harming self or others. He denies auditory or visual hallucinations. He is still preoccupied with some conspiracy against him, but does not want to provide details. He takes oral medications and denies any side effects. He continues to refuse LAI and is not willing to discuss his concerns about that. He switching to whistling occasionally during the conversation.   Past Psychiatric History: see H&P  Past Medical History:  Past Medical History:  Diagnosis Date  . Bipolar 1 disorder (HCC)   . Schizoaffective disorder (HCC)    History reviewed. No pertinent surgical history. Family History: History reviewed. No pertinent family history. Family Psychiatric  History: see H&P Social History:  Social History   Substance and Sexual Activity  Alcohol Use Not Currently     Social History   Substance and Sexual Activity  Drug Use Not Currently    Social History   Socioeconomic History  . Marital  status: Single    Spouse name: Not on file  . Number of children: Not on file  . Years of education: Not on file  . Highest education level: Not on file  Occupational History  . Not on file  Tobacco Use  . Smoking status: Light Tobacco Smoker    Packs/day: 0.25    Types: Cigarettes  . Smokeless tobacco: Never Used  Vaping Use  . Vaping Use: Never used  Substance and Sexual Activity  . Alcohol use: Not Currently  . Drug use: Not Currently  . Sexual activity: Not on file  Other Topics Concern  . Not on file  Social History Narrative  . Not on file   Social Determinants of Health   Financial Resource Strain: Not on file  Food Insecurity: Not on file  Transportation Needs: Not on file  Physical Activity: Not on file  Stress: Not on file  Social Connections: Not on file   Additional Social History:     Sleep: poor  Appetite:  Good  Current Medications: Current Facility-Administered Medications  Medication Dose Route Frequency Provider Last Rate Last Admin  . acetaminophen (TYLENOL) tablet 650 mg  650 mg Oral Q6H PRN [name], [name], MD      . alum & mag hydroxide-simeth (MAALOX/MYLANTA) 200-200-20 MG/5ML suspension 30 mL  30 mL Oral Q4H PRN [name], [name], MD   30 mL at 11/04/20 0132  . ARIPiprazole (ABILIFY) tablet 10 mg  10 mg Oral Daily [name], MD   10 mg at 11/04/20 0757  . chlorproMAZINE (THORAZINE) injection 100 mg  100 mg Intramuscular TID PRN [name],  MD   100 mg at 11/01/20 2150   And  . diphenhydrAMINE (BENADRYL) injection 50 mg  50 mg Intramuscular TID PRN [name], MD      . chlorproMAZINE (THORAZINE) tablet 100 mg  100 mg Oral TID PRN [name], MD   100 mg at 11/02/20 7035   And  . diphenhydrAMINE (BENADRYL) capsule 50 mg  50 mg Oral TID PRN [name], MD   50 mg at 11/02/20 0093  . hydrALAZINE (APRESOLINE) tablet 10 mg  10 mg Oral Q6H PRN [name], MD      . hydrOXYzine (ATARAX/VISTARIL) tablet 50 mg  50  mg Oral TID PRN [name], [name], MD   50 mg at 11/04/20 0757  . magnesium hydroxide (MILK OF MAGNESIA) suspension 30 mL  30 mL Oral Daily PRN [name], [name], MD      . OLANZapine zydis (ZYPREXA) disintegrating tablet 30 mg  30 mg Oral QHS [name], MD   30 mg at 11/03/20 2102   Or  . OLANZapine (ZYPREXA) injection 15 mg  15 mg Intramuscular QHS [name], MD   15 mg at 11/01/20 2151    Lab Results: No results found for this or any previous visit (from the past 48 hour(s)).  Blood Alcohol level:  Lab Results  Component Value Date   ETH <10 10/17/2020   ETH <10 08/11/2020    Metabolic Disorder Labs: Lab Results  Component Value Date   HGBA1C 5.2 10/23/2018   MPG 102.54 10/23/2018   No results found for: PROLACTIN Lab Results  Component Value Date   CHOL 129 10/23/2018   TRIG 58 10/23/2018   HDL 50 10/23/2018   CHOLHDL 2.6 10/23/2018   VLDL 12 10/23/2018   LDLCALC 67 10/23/2018    Physical Findings: AIMS: Facial and Oral Movements Muscles of Facial Expression: None, normal Lips and Perioral Area: None, normal Jaw: None, normal Tongue: None, normal,Extremity Movements Upper (arms, wrists, hands, fingers): None, normal Lower (legs, knees, ankles, toes): None, normal, Trunk Movements Neck, shoulders, hips: None, normal, Overall Severity Severity of abnormal movements (highest score from questions above): None, normal Incapacitation due to abnormal movements: None, normal Patient's awareness of abnormal movements (rate only patient's report): No Awareness, Dental Status Current problems with teeth and/or dentures?: No Does patient usually wear dentures?: No  CIWA:    COWS:       Musculoskeletal: Strength & Muscle Tone: within normal limits Gait & Station: normal Patient leans: N/A  Psychiatric Specialty Exam: Physical Exam  ROS  Blood pressure (!) 106/93, pulse 100, temperature 98.6 F (37 C), temperature source Oral, resp. rate 18, height 6'  (1.829 m), weight 113 kg, SpO2 98 %.  General Appearance: Casual  Eye Contact:  poor  Speech:  Normal Rate, whistling  Volume:  Normal  Mood:  Euthymic  Affect:  Guarded  Thought Process:  appears Coherent  Orientation:  Full (Time, Place, and Person)  Thought Content:  illogical, disorganized, delusional  Suicidal Thoughts:  No  Homicidal Thoughts:  No  Memory:  Immediate;   Fair Recent;   Fair Remote;   Fair  Judgement:  Poor  Insight:  No  Psychomotor Activity:  Normal  Concentration:  Concentration: Fair and Attention Span: Fair  Recall:  [employer] of Knowledge:  Fair  Language:  Fair  Akathisia:  No  Handed:  Right  AIMS (if indicated):     Assets:  Architect  Physical Health Resilience Social Support  ADL's:  Intact  Cognition:  WNL  Sleep:     Treatment Plan Summary: Daily contact with patient to assess and evaluate symptoms and progress in treatment   35yo M, who presents to [facility] inpatient psych unit due to exacerbation of Schizoaffective disorder, particularly bizarre behavior with agitation in settings of medication noncompliance.  Today patient remains partially-cooperative with care, he was compliant with oral medications and his behavior was appropriate so far today. He continues to appear delusional with poor insight and judgement . He takes oral medications. Continues to refuse LAI. Will not make medication changes today. Plan is to transition to long acting injectable.  Impression: Schizoaffective disorder, bipolar type, current episode - acute mania.  Plan: -continue inpatient psych admission; 15-minute checks; daily contact with patient to assess and evaluate symptoms and progress in treatment; psychoeducation.  -continue scheduled psych medications: . ARIPiprazole  10 mg Oral Daily  . OLANZapine zydis  30 mg Oral QHS   Or  . OLANZapine  15 mg Intramuscular QHS     -continue PRN  medications. acetaminophen, alum & mag hydroxide-simeth, chlorproMAZINE (THORAZINE) injection **AND** diphenhydrAMINE, chlorproMAZINE **AND** diphenhydrAMINE, hydrALAZINE, hydrOXYzine, magnesium hydroxide  -Disposition: to be determined. Social Work [employer] on discharge planning for as soon as patient shows some improvement. He is not ready for d/c yet. He will likely needed group home placement.    Thalia Party, MD 11/04/2020, 10:43 AM

## 2020-11-05 DIAGNOSIS — F250 Schizoaffective disorder, bipolar type: Secondary | ICD-10-CM | POA: Diagnosis not present

## 2020-11-05 NOTE — Progress Notes (Signed)
1:1 observation 1900-2300 Patient has been cooperative with staff. Medication compliant. Continues to whistle and make ritualistic gestures and exhibit magical thinking. He snapped his fingers and said he gave me an extra breath. He showered and changed his clothes and was provided with food and fluids. Asking to get off the locked hallway. 2300-0200 Awake at intervals.When asked if he needed anything he shook his head and said "I'm going through it". When asked what he was going through, he said "I have friends, and the enemy is trying to get my friends". 0200--0500 Resting in bed and up pacing from time to time.Offered food and fluids but patient declined. 0500-0700 Refused vital signs

## 2020-11-05 NOTE — Progress Notes (Signed)
Patient is screaming and cussing at the door and he appears to be responding to internal stimuli. Fluids offered. Patient accepts a cup of ice.

## 2020-11-05 NOTE — Plan of Care (Signed)
  Problem: Activity: Goal: Will verbalize the importance of balancing activity with adequate rest periods Outcome: Not Progressing   Problem: Education: Goal: Will be free of psychotic symptoms Outcome: Not Progressing Goal: Knowledge of the prescribed therapeutic regimen will improve Outcome: Not Progressing   Problem: Coping: Goal: Coping ability will improve Outcome: Not Progressing Goal: Will verbalize feelings Outcome: Not Progressing   Problem: Health Behavior/Discharge Planning: Goal: Compliance with prescribed medication regimen will improve Outcome: Not Progressing   Problem: Nutritional: Goal: Ability to achieve adequate nutritional intake will improve Outcome: Not Progressing   Problem: Role Relationship: Goal: Ability to communicate needs accurately will improve Outcome: Not Progressing Goal: Ability to interact with others will improve Outcome: Not Progressing   Problem: Safety: Goal: Ability to redirect hostility and anger into socially appropriate behaviors will improve Outcome: Not Progressing Goal: Ability to remain free from injury will improve Outcome: Not Progressing   Problem: Self-Care: Goal: Ability to participate in self-care as condition permits will improve Outcome: Not Progressing   Problem: Self-Concept: Goal: Will verbalize positive feelings about self Outcome: Not Progressing   Problem: Education: Goal: Knowledge of [facility] Education information/materials will improve Outcome: Not Progressing Goal: Emotional status will improve Outcome: Not Progressing Goal: Mental status will improve Outcome: Not Progressing Goal: Verbalization of understanding the information provided will improve Outcome: Not Progressing   Problem: Activity: Goal: Interest or engagement in activities will improve Outcome: Not Progressing Goal: Sleeping patterns will improve Outcome: Not Progressing   Problem: Coping: Goal: Ability to verbalize  frustrations and anger appropriately will improve Outcome: Not Progressing Goal: Ability to demonstrate self-control will improve Outcome: Not Progressing   Problem: Health Behavior/Discharge Planning: Goal: Identification of resources available to assist in meeting health care needs will improve Outcome: Not Progressing Goal: Compliance with treatment plan for underlying cause of condition will improve Outcome: Not Progressing   Problem: Physical Regulation: Goal: Ability to maintain clinical measurements within normal limits will improve Outcome: Not Progressing   Problem: Safety: Goal: Periods of time without injury will increase Outcome: Not Progressing   Problem: Education: Goal: Ability to state activities that reduce stress will improve Outcome: Not Progressing   Problem: Coping: Goal: Ability to identify and develop effective coping behavior will improve Outcome: Not Progressing   Problem: Self-Concept: Goal: Ability to identify factors that promote anxiety will improve Outcome: Not Progressing Goal: Level of anxiety will decrease Outcome: Not Progressing Goal: Ability to modify response to factors that promote anxiety will improve Outcome: Not Progressing   Problem: Safety: Goal: Violent Restraint(s) Outcome: Not Progressing   

## 2020-11-05 NOTE — Plan of Care (Signed)
Problem: Activity: Goal: Will verbalize the importance of balancing activity with adequate rest periods 11/05/2020 0717 by Billy Coast, RN Outcome: Not Progressing 11/05/2020 0546 by Billy Coast, RN Outcome: Not Progressing   Problem: Education: Goal: Will be free of psychotic symptoms 11/05/2020 0717 by Billy Coast, RN Outcome: Not Progressing 11/05/2020 0546 by Billy Coast, RN Outcome: Not Progressing Goal: Knowledge of the prescribed therapeutic regimen will improve 11/05/2020 0717 by Billy Coast, RN Outcome: Not Progressing 11/05/2020 0546 by Billy Coast, RN Outcome: Not Progressing   Problem: Coping: Goal: Coping ability will improve 11/05/2020 0717 by Billy Coast, RN Outcome: Not Progressing 11/05/2020 0546 by Billy Coast, RN Outcome: Not Progressing Goal: Will verbalize feelings 11/05/2020 0717 by Billy Coast, RN Outcome: Not Progressing 11/05/2020 0546 by Billy Coast, RN Outcome: Not Progressing   Problem: Health Behavior/Discharge Planning: Goal: Compliance with prescribed medication regimen will improve 11/05/2020 0717 by Billy Coast, RN Outcome: Not Progressing 11/05/2020 0546 by Billy Coast, RN Outcome: Not Progressing   Problem: Nutritional: Goal: Ability to achieve adequate nutritional intake will improve 11/05/2020 0717 by Billy Coast, RN Outcome: Not Progressing 11/05/2020 0546 by Billy Coast, RN Outcome: Not Progressing   Problem: Role Relationship: Goal: Ability to communicate needs accurately will improve 11/05/2020 0717 by Billy Coast, RN Outcome: Not Progressing 11/05/2020 0546 by Billy Coast, RN Outcome: Not Progressing Goal: Ability to interact with others will improve 11/05/2020 0717 by Billy Coast, RN Outcome: Not Progressing 11/05/2020 0546 by Billy Coast, RN Outcome: Not Progressing   Problem: Safety: Goal: Ability to redirect hostility and  anger into socially appropriate behaviors will improve 11/05/2020 0717 by Billy Coast, RN Outcome: Not Progressing 11/05/2020 0546 by Billy Coast, RN Outcome: Not Progressing Goal: Ability to remain free from injury will improve 11/05/2020 0717 by Billy Coast, RN Outcome: Not Progressing 11/05/2020 0546 by Billy Coast, RN Outcome: Not Progressing   Problem: Self-Care: Goal: Ability to participate in self-care as condition permits will improve 11/05/2020 0717 by Billy Coast, RN Outcome: Not Progressing 11/05/2020 0546 by Billy Coast, RN Outcome: Not Progressing   Problem: Self-Concept: Goal: Will verbalize positive feelings about self 11/05/2020 0717 by Billy Coast, RN Outcome: Not Progressing 11/05/2020 0546 by Billy Coast, RN Outcome: Not Progressing   Problem: Education: Goal: Knowledge of  General Education information/materials will improve 11/05/2020 0717 by Billy Coast, RN Outcome: Not Progressing 11/05/2020 0546 by Billy Coast, RN Outcome: Not Progressing Goal: Emotional status will improve 11/05/2020 0717 by Billy Coast, RN Outcome: Not Progressing 11/05/2020 0546 by Billy Coast, RN Outcome: Not Progressing Goal: Mental status will improve 11/05/2020 0717 by Billy Coast, RN Outcome: Not Progressing 11/05/2020 0546 by Billy Coast, RN Outcome: Not Progressing Goal: Verbalization of understanding the information provided will improve 11/05/2020 0717 by Billy Coast, RN Outcome: Not Progressing 11/05/2020 0546 by Billy Coast, RN Outcome: Not Progressing   Problem: Activity: Goal: Interest or engagement in activities will improve 11/05/2020 0717 by Billy Coast, RN Outcome: Not Progressing 11/05/2020 0546 by Billy Coast, RN Outcome: Not Progressing Goal: Sleeping patterns will improve 11/05/2020 0717 by Billy Coast, RN Outcome: Not Progressing 11/05/2020  0546 by Billy Coast, RN Outcome: Not Progressing   Problem: Coping: Goal: Ability to verbalize frustrations and anger appropriately will improve 11/05/2020 0717 by Billy Coast, RN  Outcome: Not Progressing 11/05/2020 0546 by Billy Coast, RN Outcome: Not Progressing Goal: Ability to demonstrate self-control will improve 11/05/2020 0717 by Billy Coast, RN Outcome: Not Progressing 11/05/2020 0546 by Billy Coast, RN Outcome: Not Progressing   Problem: Health Behavior/Discharge Planning: Goal: Identification of resources available to assist in meeting health care needs will improve 11/05/2020 0717 by Billy Coast, RN Outcome: Not Progressing 11/05/2020 0546 by Billy Coast, RN Outcome: Not Progressing Goal: Compliance with treatment plan for underlying cause of condition will improve 11/05/2020 0717 by Billy Coast, RN Outcome: Not Progressing 11/05/2020 0546 by Billy Coast, RN Outcome: Not Progressing   Problem: Physical Regulation: Goal: Ability to maintain clinical measurements within normal limits will improve 11/05/2020 0717 by Billy Coast, RN Outcome: Not Progressing 11/05/2020 0546 by Billy Coast, RN Outcome: Not Progressing   Problem: Safety: Goal: Periods of time without injury will increase 11/05/2020 0717 by Billy Coast, RN Outcome: Not Progressing 11/05/2020 0546 by Billy Coast, RN Outcome: Not Progressing   Problem: Education: Goal: Ability to state activities that reduce stress will improve 11/05/2020 0717 by Billy Coast, RN Outcome: Not Progressing 11/05/2020 0546 by Billy Coast, RN Outcome: Not Progressing   Problem: Coping: Goal: Ability to identify and develop effective coping behavior will improve 11/05/2020 0717 by Billy Coast, RN Outcome: Not Progressing 11/05/2020 0546 by Billy Coast, RN Outcome: Not Progressing   Problem: Self-Concept: Goal: Ability to identify  factors that promote anxiety will improve 11/05/2020 0717 by Billy Coast, RN Outcome: Not Progressing 11/05/2020 0546 by Billy Coast, RN Outcome: Not Progressing Goal: Level of anxiety will decrease 11/05/2020 0717 by Billy Coast, RN Outcome: Not Progressing 11/05/2020 0546 by Billy Coast, RN Outcome: Not Progressing Goal: Ability to modify response to factors that promote anxiety will improve 11/05/2020 0717 by Billy Coast, RN Outcome: Not Progressing 11/05/2020 0546 by Billy Coast, RN Outcome: Not Progressing   Problem: Safety: Goal: Violent Restraint(s) 11/05/2020 0717 by Billy Coast, RN Outcome: Not Progressing 11/05/2020 0546 by Billy Coast, RN Outcome: Not Progressing

## 2020-11-05 NOTE — Progress Notes (Signed)
Patient is cooperative with staff this morning. He accepted his breakfast tray and was agreeable to taking Abilify. Patient continues to whistle and snap and does not engage in conversation. Patient is not observed to be in any distress and remains on 1:1.

## 2020-11-05 NOTE — [facility] Counselor (Addendum)
CSW received call from Deanna at [facility] to verify that pt is still here and he was on the priority list. She requested update notes to see where he will be (whether on priority or wait list). CSW agreed. No other concerns expressed. Contact ended without incident.   CSW faxed over provider notes from Friday (4/1) and the weekend.   Vilma Meckel. Algis Greenhouse, MSW, LCSW, LCAS 11/05/2020 11:53 AM

## 2020-11-05 NOTE — Progress Notes (Signed)
Patient has remained isolative to his room. Lunch tray was brought back to patient. Patient is not observed to be in any distress and remains on 1:1.

## 2020-11-05 NOTE — [facility] Group Notes (Signed)
LCSW Group Therapy Note     11/05/2020 3:15 PM     Type of Therapy and Topic:  Group Therapy:  Overcoming Obstacles     Participation Level:  Did Not Attend     Description of Group:     In this group patients will be encouraged to explore what they see as obstacles to their own wellness and recovery. They will be guided to discuss their thoughts, feelings, and behaviors related to these obstacles. The group will process together ways to cope with barriers, with attention given to specific choices patients can make. Each patient will be challenged to identify changes they are motivated to make in order to overcome their obstacles. This group will be process-oriented, with patients participating in exploration of their own experiences as well as giving and receiving support and challenge from other group members.     Therapeutic Goals:  1.    Patient will identify personal and current obstacles as they relate to admission.  2.    Patient will identify barriers that currently interfere with their wellness or overcoming obstacles.  3.    Patient will identify feelings, thought process and behaviors related to these barriers.  4.    Patient will identify two changes they are willing to make to overcome these obstacles:        Summary of Patient Progress: X     Therapeutic Modalities:    Cognitive Behavioral Therapy  Solution Focused Therapy  Motivational Interviewing  Relapse Prevention Therapy     Ronie Fleeger [place], MSW, LCSW-A  11/05/2020 3:15 PM

## 2020-11-05 NOTE — Progress Notes (Signed)
Patient exhibits magical thinking and states, "my food is contaminated by [employer]. You want to see what it does?" He proceeds to eat a chip and snaps and whistles. He then states, "you want me to hate?" and becomes tearful. Support and encouragement was provided.

## 2020-11-05 NOTE — Progress Notes (Signed)
[place] MD Progress Note  11/05/2020 12:37 PM Jackson [name]  MRN:  [id number]  Principal Problem: Schizoaffective disorder, bipolar type (HCC) Diagnosis: Principal Problem:   Schizoaffective disorder, bipolar type (HCC) Active Problems:   Noncompliance  Total Time spent with patient: 20 min  CC: Hospital follow-up for patient with schizoaffective disorder, bipolar type.   35yo M, who presents to [facility] inpatient psych unit due to exacerbation of Schizoaffective disorder, particularly bizarre behavior with agitation in settings of medication noncompliance.   SUBJECTIVE: Patient seen, chart reviewed. Patient seen in the presence of security guard for safety. Patient is in restricted area of the unit. He was irritable this morning. He glances up once to glare at provider. After this he stares out the window, and refuses to speak. He occasionally snaps his fingers in a ritual. He refuses to answer any questions today. He has been medication compliant.   Past Psychiatric History: see H&P  Past Medical History:  Past Medical History:  Diagnosis Date  . Bipolar 1 disorder (HCC)   . Schizoaffective disorder (HCC)    History reviewed. No pertinent surgical history. Family History: History reviewed. No pertinent family history. Family Psychiatric  History: see H&P Social History:  Social History   Substance and Sexual Activity  Alcohol Use Not Currently     Social History   Substance and Sexual Activity  Drug Use Not Currently    Social History   Socioeconomic History  . Marital status: Single    Spouse name: Not on file  . Number of children: Not on file  . Years of education: Not on file  . Highest education level: Not on file  Occupational History  . Not on file  Tobacco Use  . Smoking status: Light Tobacco Smoker    Packs/day: 0.25    Types: Cigarettes  . Smokeless tobacco: Never Used  Vaping Use  . Vaping Use: Never used  Substance and Sexual Activity  . Alcohol  use: Not Currently  . Drug use: Not Currently  . Sexual activity: Not on file  Other Topics Concern  . Not on file  Social History Narrative  . Not on file   Social Determinants of Health   Financial Resource Strain: Not on file  Food Insecurity: Not on file  Transportation Needs: Not on file  Physical Activity: Not on file  Stress: Not on file  Social Connections: Not on file   Additional Social History:     Sleep: poor  Appetite:  Good  Current Medications: Current Facility-Administered Medications  Medication Dose Route Frequency Provider Last Rate Last Admin  . acetaminophen (TYLENOL) tablet 650 mg  650 mg Oral Q6H PRN [name], [name], MD      . alum & mag hydroxide-simeth (MAALOX/MYLANTA) 200-200-20 MG/5ML suspension 30 mL  30 mL Oral Q4H PRN [name], [name], MD   30 mL at 11/04/20 0132  . ARIPiprazole (ABILIFY) tablet 10 mg  10 mg Oral Daily [name], MD   10 mg at 11/05/20 0740  . chlorproMAZINE (THORAZINE) injection 100 mg  100 mg Intramuscular TID PRN [name], MD   100 mg at 11/01/20 2150   And  . diphenhydrAMINE (BENADRYL) injection 50 mg  50 mg Intramuscular TID PRN [name], MD      . chlorproMAZINE (THORAZINE) tablet 100 mg  100 mg Oral TID PRN [name], MD   100 mg at 11/02/20 6256   And  . diphenhydrAMINE (BENADRYL) capsule 50 mg  50 mg Oral TID PRN [name], MD   50 mg at 11/02/20 7096  . hydrALAZINE (APRESOLINE) tablet 10 mg  10 mg Oral Q6H PRN [name], MD      . hydrOXYzine (ATARAX/VISTARIL) tablet 50 mg  50 mg Oral TID PRN [name], [name], MD   50 mg at 11/04/20 0757  . magnesium hydroxide (MILK OF MAGNESIA) suspension 30 mL  30 mL Oral Daily PRN [name], [name], MD      . OLANZapine zydis (ZYPREXA) disintegrating tablet 30 mg  30 mg Oral QHS [name], MD   30 mg at 11/04/20 2109   Or  . OLANZapine (ZYPREXA) injection 15 mg  15 mg Intramuscular QHS [name], MD   15 mg at 11/01/20 2151     Lab Results: No results found for this or any previous visit (from the past 48 hour(s)).  Blood Alcohol level:  Lab Results  Component Value Date   ETH <10 10/17/2020   ETH <10 08/11/2020    Metabolic Disorder Labs: Lab Results  Component Value Date   HGBA1C 5.2 10/23/2018   MPG 102.54 10/23/2018   No results found for: PROLACTIN Lab Results  Component Value Date   CHOL 129 10/23/2018   TRIG 58 10/23/2018   HDL 50 10/23/2018   CHOLHDL 2.6 10/23/2018   VLDL 12 10/23/2018   LDLCALC 67 10/23/2018    Physical Findings: AIMS: Facial and Oral Movements Muscles of Facial Expression: None, normal Lips and Perioral Area: None, normal Jaw: None, normal Tongue: None, normal,Extremity Movements Upper (arms, wrists, hands, fingers): None, normal Lower (legs, knees, ankles, toes): None, normal, Trunk Movements Neck, shoulders, hips: None, normal, Overall Severity Severity of abnormal movements (highest score from questions above): None, normal Incapacitation due to abnormal movements: None, normal Patient's awareness of abnormal movements (rate only patient's report): No Awareness, Dental Status Current problems with teeth and/or dentures?: No Does patient usually wear dentures?: No  CIWA:    COWS:       Musculoskeletal: Strength & Muscle Tone: within normal limits Gait & Station: normal Patient leans: N/A  Psychiatric Specialty Exam: Physical Exam  ROS  Blood pressure (!) 106/93, pulse 100, temperature 98.6 F (37 C), temperature source Oral, resp. rate 18, height 6' (1.829 m), weight 113 kg, SpO2 98 %.  General Appearance: Casual  Eye Contact:  poor  Speech:  Mute  Volume:  Mute  Mood:  Irritable  Affect:  Guarded  Thought Process:  Unable to assess  Orientation:   Unable to assess  Thought Content:   Unable to assess  Suicidal Thoughts:   Unable to assess  Homicidal Thoughts:   Unable to assess  Memory:  Unable to assess  Judgement:  Poor  Insight:  No   Psychomotor Activity:  Normal  Concentration:   Unable to assess  Recall:   Unable to assess  Fund of Knowledge:  Fair  Language:   Unable to assess  Akathisia:  No  Handed:  Right  AIMS (if indicated):     Assets:   Health and safety inspector Physical Health Resilience Social Support  ADL's:  Intact  Cognition:  Unable to assess  Sleep: Poor, 3.25 hours     Treatment Plan Summary: Daily contact with patient to assess and evaluate symptoms and progress in treatment   [name], who presents to [facility] inpatient psych unit due to exacerbation of Schizoaffective disorder, particularly bizarre behavior with agitation in settings of medication noncompliance.  Today patient remains partially-cooperative with care, he was compliant with oral medications, but refuses to speak to provider. He continues to appear delusional with poor insight and judgement .Continues to refuse LAI. Will not make medication changes today. Plan is to transition to long acting injectable.  Impression: Schizoaffective disorder, bipolar type, current episode - acute mania.  Plan: -continue inpatient psych admission; 15-minute checks; daily contact with patient to assess and evaluate symptoms and progress in treatment; psychoeducation.  -continue scheduled psych medications: . ARIPiprazole  10 mg Oral Daily  . OLANZapine zydis  30 mg Oral QHS   Or  . OLANZapine  15 mg Intramuscular QHS     -continue PRN medications. acetaminophen, alum & mag hydroxide-simeth, chlorproMAZINE (THORAZINE) injection **AND** diphenhydrAMINE, chlorproMAZINE **AND** diphenhydrAMINE, hydrALAZINE, hydrOXYzine, magnesium hydroxide  -Disposition: to be determined. Social Work [employer] on discharge planning for as soon as patient shows some improvement. He is not ready for d/c yet. He will likely needed group home placement.  11/05/20: Psychiatric exam above reviewed and remains accurate. Assessment and plan above reviewed  and updated.    [name], MD 11/05/2020, 12:37 PM

## 2020-11-05 NOTE — Progress Notes (Addendum)
Patient was offered dinner tray but refused, stating "I don't want your food here". Patient is pacing around the back hall and is staring intensely at the nurses station.

## 2020-11-05 NOTE — Progress Notes (Signed)
Patient was given shower supplies but states water in his room is still cold. He is irritable when told he will need to wait if he wants to come out of the back hall to take a shower. He states, "why am I back here. I didn't do nothing". Patient bites his own finger and cusses at this Clinical research associate.

## 2020-11-05 NOTE — BHH Counselor (Signed)
CSW called [facility] to confirm receipt of information for update on pt. CSW spoke with Deanna who confirmed receipt of updated information and that his case was under review to see if he remains on the priority list or moves to the wait list. No other concerns expressed. Contact ended without incident.   Vilma Meckel. Algis Greenhouse, MSW, LCSW, LCAS 11/05/2020 1:17 PM

## 2020-11-05 NOTE — Progress Notes (Signed)
Patient is sitting by the entrance of the back hall without clothes on. He is intensely staring.

## 2020-11-05 NOTE — Progress Notes (Signed)
Recreation Therapy Notes  Date: 11/05/2020  Time: 9:30 am   Location: Craft room    Behavioral response: N/A   Intervention Topic: Stress Management   Discussion/Intervention: Patient did not attend group.   Clinical Observations/Feedback:  Patient did not attend group.   Tarina Volk LRT/CTRS          Tayva Easterday 11/05/2020 11:17 AM

## 2020-11-06 DIAGNOSIS — F250 Schizoaffective disorder, bipolar type: Secondary | ICD-10-CM | POA: Diagnosis not present

## 2020-11-06 MED ORDER — VALPROIC ACID 250 MG/5ML PO SOLN
500.0000 mg | Freq: Two times a day (BID) | ORAL | Status: DC
  Administered 2020-11-06 – 2020-11-09 (×2): 500 mg via ORAL
  Filled 2020-11-06 (×9): qty 10

## 2020-11-06 MED ORDER — ARIPIPRAZOLE 5 MG PO TABS
15.0000 mg | ORAL_TABLET | Freq: Every day | ORAL | Status: DC
  Administered 2020-11-06 – 2020-11-13 (×3): 15 mg via ORAL
  Filled 2020-11-06 (×9): qty 1

## 2020-11-06 NOTE — BHH Group Notes (Signed)
LCSW Group Therapy Note  11/06/2020 3:01 PM  Type of Therapy/Topic:  Group Therapy:  Feelings about Diagnosis  Participation Level:  Did Not Attend   Description of Group:   This group will allow patients to explore their thoughts and feelings about diagnoses they have received. Patients will be guided to explore their level of understanding and acceptance of these diagnoses. Facilitator will encourage patients to process their thoughts and feelings about the reactions of others to their diagnosis and will guide patients in identifying ways to discuss their diagnosis with significant others in their lives. This group will be process-oriented, with patients participating in exploration of their own experiences, giving and receiving support, and processing challenge from other group members.   Therapeutic Goals: 1. Patient will demonstrate understanding of diagnosis as evidenced by identifying two or more symptoms of the disorder 2. Patient will be able to express two feelings regarding the diagnosis 3. Patient will demonstrate their ability to communicate their needs through discussion and/or role play  Summary of Patient Progress: X  Therapeutic Modalities:   Cognitive Behavioral Therapy Brief Therapy Feelings Identification   Jerre Diguglielmo R. Algis Greenhouse, MSW, LCSW, LCAS 11/06/2020 3:01 PM

## 2020-11-06 NOTE — Progress Notes (Signed)
Recreation Therapy Notes  Date: 11/06/2020  Time: 9:30 am   Location: Craft room    Behavioral response: N/A   Intervention Topic: Goals   Discussion/Intervention: Patient did not attend group.   Clinical Observations/Feedback:  Patient did not attend group.   Alrick Cubbage LRT/CTRS        Kaylla Cobos 11/06/2020 12:07 PM

## 2020-11-06 NOTE — Plan of Care (Signed)
°  Problem: Group Participation °Goal: STG - Patient will engage in groups with a calm and appropriate mood at least 2x within 5 recreation therapy group [place] °Description: STG - Patient will engage in groups with a calm and appropriate mood at least 2x within 5 recreation therapy group [place] °Outcome: Not Progressing °  °

## 2020-11-06 NOTE — Tx Team (Signed)
Interdisciplinary Treatment and Diagnostic Plan Update  11/06/2020 Time of Session: 8:30 AM  Jackson [name] MRN: [id number]  Principal Diagnosis: Schizoaffective disorder, bipolar type (HCC)  Secondary Diagnoses: Principal Problem:   Schizoaffective disorder, bipolar type (HCC) Active Problems:   Noncompliance   Current Medications:  Current Facility-Administered Medications  Medication Dose Route Frequency Provider Last Rate Last Admin  . acetaminophen (TYLENOL) tablet 650 mg  650 mg Oral Q6H PRN [name], [name], MD      . alum & mag hydroxide-simeth (MAALOX/MYLANTA) 200-200-20 MG/5ML suspension 30 mL  30 mL Oral Q4H PRN [name], [name], MD   30 mL at 11/04/20 0132  . ARIPiprazole (ABILIFY) tablet 15 mg  15 mg Oral Daily [name], MD      . chlorproMAZINE (THORAZINE) injection 100 mg  100 mg Intramuscular TID PRN [name], MD   100 mg at 11/01/20 2150   And  . diphenhydrAMINE (BENADRYL) injection 50 mg  50 mg Intramuscular TID PRN [name], MD      . chlorproMAZINE (THORAZINE) tablet 100 mg  100 mg Oral TID PRN [name], MD   100 mg at 11/02/20 4098   And  . diphenhydrAMINE (BENADRYL) capsule 50 mg  50 mg Oral TID PRN [name], MD   50 mg at 11/02/20 1191  . hydrALAZINE (APRESOLINE) tablet 10 mg  10 mg Oral Q6H PRN [name], MD      . hydrOXYzine (ATARAX/VISTARIL) tablet 50 mg  50 mg Oral TID PRN [name], [name], MD   50 mg at 11/05/20 2131  . magnesium hydroxide (MILK OF MAGNESIA) suspension 30 mL  30 mL Oral Daily PRN [name], [name], MD      . OLANZapine zydis (ZYPREXA) disintegrating tablet 30 mg  30 mg Oral QHS [name], MD   30 mg at 11/05/20 2131   Or  . OLANZapine (ZYPREXA) injection 15 mg  15 mg Intramuscular QHS [name], MD   15 mg at 11/01/20 2151   PTA Medications: Medications Prior to Admission  Medication Sig Dispense Refill Last Dose  . [DISCONTINUED] lurasidone (LATUDA) 20 MG TABS tablet Take 1  tablet (20 mg total) by mouth daily with supper. (Patient not taking: Reported on 10/17/2020) 30 tablet 1   . [DISCONTINUED] OLANZapine (ZYPREXA) 20 MG tablet Take 1 tablet (20 mg total) by mouth at bedtime. (Patient not taking: Reported on 10/17/2020) 30 tablet 1     Patient Stressors: Medication change or noncompliance  Patient Strengths: Wellsite geologist fund of knowledge Supportive family/friends  Treatment Modalities: Medication Management, Group therapy, Case management,  1 to 1 session with clinician, Psychoeducation, Recreational therapy.   Physician Treatment Plan for Primary Diagnosis: Schizoaffective disorder, bipolar type (HCC) Long Term Goal(s): Improvement in symptoms so as ready for discharge Improvement in symptoms so as ready for discharge   Short Term Goals:    Medication Management: Evaluate patient's response, side effects, and tolerance of medication regimen.  Therapeutic Interventions: 1 to 1 [place], Unit Group [place] and Medication administration.  Evaluation of Outcomes: Not Progressing  Physician Treatment Plan for Secondary Diagnosis: Principal Problem:   Schizoaffective disorder, bipolar type (HCC) Active Problems:   Noncompliance  Long Term Goal(s): Improvement in symptoms so as ready for discharge Improvement in symptoms so as ready for discharge   Short Term Goals:       Medication Management: Evaluate patient's response, side effects, and tolerance of medication regimen.  Therapeutic  Interventions: 1 to 1 [place], Unit Group [place] and Medication administration.  Evaluation of Outcomes: Not Progressing   RN Treatment Plan for Primary Diagnosis: Schizoaffective disorder, bipolar type (HCC) Long Term Goal(s): Knowledge of disease and therapeutic regimen to maintain health will improve  Short Term Goals: Ability to remain free from injury will improve, Ability to verbalize frustration and anger appropriately will improve,  Ability to demonstrate self-control, Ability to participate in decision making will improve, Ability to verbalize feelings will improve, Ability to identify and develop effective coping behaviors will improve and Compliance with prescribed medications will improve  Medication Management: RN will administer medications as ordered by provider, will assess and evaluate patient's response and provide education to patient for prescribed medication. RN will report any adverse and/or side effects to prescribing provider.  Therapeutic Interventions: 1 on 1 counseling [place], Psychoeducation, Medication administration, Evaluate responses to treatment, Monitor vital signs and CBGs as ordered, Perform/monitor CIWA, COWS, AIMS and Fall Risk screenings as ordered, Perform wound care treatments as ordered.  Evaluation of Outcomes: Not Progressing   LCSW Treatment Plan for Primary Diagnosis: Schizoaffective disorder, bipolar type (HCC) Long Term Goal(s): Safe transition to appropriate next level of care at discharge, Engage patient in therapeutic group addressing interpersonal concerns.  Short Term Goals: Engage patient in aftercare planning with referrals and resources, Increase ability to appropriately verbalize feelings, Increase emotional regulation, Facilitate acceptance of mental health diagnosis and concerns, Identify triggers associated with mental health/substance abuse issues and Increase skills for wellness and recovery  Therapeutic Interventions: Assess for all discharge needs, 1 to 1 time with Social worker, Explore available resources and support systems, Assess for adequacy in community support network, Educate family and significant other(s) on suicide prevention, Complete Psychosocial Assessment, Interpersonal group therapy.  Evaluation of Outcomes: Not Progressing   Progress in Treatment: Attending groups: No. Participating in groups: No. Taking medication as prescribed: No. Toleration  medication: No. Family/Significant other contact made: Yes, individual(s) contacted:  pt's guardian has been contacted. Patient understands diagnosis: Yes. Discussing patient identified problems/goals with staff: No. Medical problems stabilized or resolved: Yes. Denies suicidal/homicidal ideation: Yes. Issues/concerns per patient self-inventory: No. Other: None  New problem(s) identified: No, Describe:  None  New Short Term/Long Term Goal(s): Elimination of symptoms of psychosis, medication management for mood stabilization; development of comprehensive mental wellness plan. Update 10/27/20: No changes at this time. Update 11/01/20: No changes at this time. Update 11/06/20: No changes at this time.   Patient Goals: Patient declined to participate in treatment team meeting despite invitation by nursing staff. Update: 10/27/20 No changes at this time. Update 11/01/20: Pt continues to be aggressive and to have forced medication. Update 11/06/20: Pt continues to remain on the back hall due to safety concerns for staff and peers.  Discharge Plan or Barriers: CSW will assist with development of an appropriate discharge/aftercare plan. Update: 10/27/20 No changes at this time. Update 11/01/20: Pt has been referred to [facility], however, he was on the wait list when they were contacted on 10/31/20. CSW will follow up regarding this today. Update 11/06/20: Pt was on the priority list when contacted by [facility] on 11/05/20, however, updated information was requested and CSW did not hear back from facility if pt remains on this list.   Reason for Continuation of Hospitalization: Aggression Delusions  Hallucinations Medication stabilization  Estimated Length of Stay: TBD  Attendees: Patient: 11/06/2020 9:02 AM  Physician: Les Pou, MD 11/06/2020 9:02 AM  Nursing:  11/06/2020 9:02 AM  RN Care  Manager: 11/06/2020 9:02 AM  Social Worker: Vilma Meckel. Algis Greenhouse, MSW, LCSW, LCAS 11/06/2020 9:02 AM  Recreational Therapist:  11/06/2020 9:02 AM   Other: Kiva [place], MSW, LCSW-A 11/06/2020 9:02 AM  Other: Gwenevere Ghazi, MSW, [place], Bridget Hartshorn 11/06/2020 9:02 AM  Other: 11/06/2020 9:02 AM    Scribe for Treatment Team: Glenis Smoker, LCSW 11/06/2020 9:02 AM

## 2020-11-06 NOTE — Progress Notes (Signed)
[facility] MD Progress Note  11/06/2020 12:15 PM Jackson [name]  MRN:  [id number]  Principal Problem: Schizoaffective disorder, bipolar type (HCC) Diagnosis: Principal Problem:   Schizoaffective disorder, bipolar type (HCC) Active Problems:   Noncompliance  Total Time spent with patient: 20 min  CC: Hospital follow-up for patient with schizoaffective disorder, bipolar type.   35yo M, who presents to [facility] inpatient psych unit due to exacerbation of Schizoaffective disorder, particularly bizarre behavior with agitation in settings of medication noncompliance.   SUBJECTIVE: Patient seen, chart reviewed. Overnight patient had paranoia about his food being tainted by the masons, and would not eat anything aside from prepackaged snacks. He was noted to be naked in the hallway yelling, cursing, and responding to internal stimuli. This morning patient noted to be pounding on the walls. He is actively responding to internal stimuli this morning as well. He continues to snap his fingers, whistle, and speak of alternate dimensions. He has an intense glare, and tells me to walk away today after making a fist. Interview terminated for safety.   Past Psychiatric History: see H&P  Past Medical History:  Past Medical History:  Diagnosis Date  . Bipolar 1 disorder (HCC)   . Schizoaffective disorder (HCC)    History reviewed. No pertinent surgical history. Family History: History reviewed. No pertinent family history. Family Psychiatric  History: see H&P Social History:  Social History   Substance and Sexual Activity  Alcohol Use Not Currently     Social History   Substance and Sexual Activity  Drug Use Not Currently    Social History   Socioeconomic History  . Marital status: Single    Spouse name: Not on file  . Number of children: Not on file  . Years of education: Not on file  . Highest education level: Not on file  Occupational History  . Not on file  Tobacco Use  . Smoking status:  Light Tobacco Smoker    Packs/day: 0.25    Types: Cigarettes  . Smokeless tobacco: Never Used  Vaping Use  . Vaping Use: Never used  Substance and Sexual Activity  . Alcohol use: Not Currently  . Drug use: Not Currently  . Sexual activity: Not on file  Other Topics Concern  . Not on file  Social History Narrative  . Not on file   Social Determinants of Health   Financial Resource Strain: Not on file  Food Insecurity: Not on file  Transportation Needs: Not on file  Physical Activity: Not on file  Stress: Not on file  Social Connections: Not on file   Additional Social History:     Sleep: poor  Appetite:  Good  Current Medications: Current Facility-Administered Medications  Medication Dose Route Frequency Provider Last Rate Last Admin  . acetaminophen (TYLENOL) tablet 650 mg  650 mg Oral Q6H PRN [name], [name], MD      . alum & mag hydroxide-simeth (MAALOX/MYLANTA) 200-200-20 MG/5ML suspension 30 mL  30 mL Oral Q4H PRN [name], [name], MD   30 mL at 11/04/20 0132  . ARIPiprazole (ABILIFY) tablet 15 mg  15 mg Oral Daily [name], MD   15 mg at 11/06/20 0953  . chlorproMAZINE (THORAZINE) injection 100 mg  100 mg Intramuscular TID PRN [name], MD   100 mg at 11/01/20 2150   And  . diphenhydrAMINE (BENADRYL) injection 50 mg  50 mg Intramuscular TID PRN [name], MD      . chlorproMAZINE (THORAZINE) tablet 100 mg  100 mg Oral TID PRN [name], MD   100 mg at 11/02/20 9381   And  . diphenhydrAMINE (BENADRYL) capsule 50 mg  50 mg Oral TID PRN [name], MD   50 mg at 11/02/20 8299  . hydrALAZINE (APRESOLINE) tablet 10 mg  10 mg Oral Q6H PRN [name], MD      . hydrOXYzine (ATARAX/VISTARIL) tablet 50 mg  50 mg Oral TID PRN [name], [name], MD   50 mg at 11/05/20 2131  . magnesium hydroxide (MILK OF MAGNESIA) suspension 30 mL  30 mL Oral Daily PRN [name], [name], MD      . OLANZapine zydis (ZYPREXA) disintegrating tablet 30 mg  30  mg Oral QHS [name], MD   30 mg at 11/05/20 2131   Or  . OLANZapine (ZYPREXA) injection 15 mg  15 mg Intramuscular QHS [name], MD   15 mg at 11/01/20 2151  . valproic acid (DEPAKENE) 250 MG/5ML solution 500 mg  500 mg Oral BID [name], MD        Lab Results: No results found for this or any previous visit (from the past 48 hour(s)).  Blood Alcohol level:  Lab Results  Component Value Date   ETH <10 10/17/2020   ETH <10 08/11/2020    Metabolic Disorder Labs: Lab Results  Component Value Date   HGBA1C 5.2 10/23/2018   MPG 102.54 10/23/2018   No results found for: PROLACTIN Lab Results  Component Value Date   CHOL 129 10/23/2018   TRIG 58 10/23/2018   HDL 50 10/23/2018   CHOLHDL 2.6 10/23/2018   VLDL 12 10/23/2018   LDLCALC 67 10/23/2018    Physical Findings: AIMS: Facial and Oral Movements Muscles of Facial Expression: None, normal Lips and Perioral Area: None, normal Jaw: None, normal Tongue: None, normal,Extremity Movements Upper (arms, wrists, hands, fingers): None, normal Lower (legs, knees, ankles, toes): None, normal, Trunk Movements Neck, shoulders, hips: None, normal, Overall Severity Severity of abnormal movements (highest score from questions above): None, normal Incapacitation due to abnormal movements: None, normal Patient's awareness of abnormal movements (rate only patient's report): No Awareness, Dental Status Current problems with teeth and/or dentures?: No Does patient usually wear dentures?: No  CIWA:    COWS:       Musculoskeletal: Strength & Muscle Tone: within normal limits Gait & Station: normal Patient leans: N/A  Psychiatric Specialty Exam: Physical Exam  ROS  Blood pressure (!) 106/93, pulse 100, temperature 98.6 F (37 C), temperature source Oral, resp. rate 18, height 6' (1.829 m), weight 113 kg, SpO2 98 %.  General Appearance: Casual  Eye Contact:  Intense, glaring  Speech: Pressured  Volume:  Increased  Mood:  Irritable  Affect:  Guarded  Thought Process:  Illogicial, delusions, paranoia  Orientation:   To person, place, and time  Thought Content:   Delusions of being part of the illuminati and masons being on the unit trying to poison him.   Suicidal Thoughts:   Denies  Homicidal Thoughts:   Denies  Memory:  Fair  Judgement:  Poor  Insight:  No  Psychomotor Activity:  Increased, pacing hallway, punching walls, jumping and twirling around in the air, snapping fingers intermittently  Concentration:   Poor  Recall:   Poor  Fund of Knowledge:  Fair  Language:   Fair  Akathisia:  No  Handed:  Right  AIMS (if indicated):     Assets:   Financial Resources/Insurance  Physical Health Resilience Social Support  ADL's:  Intact  Cognition:  Unable to assess  Sleep: Poor, 4 hours     Treatment Plan Summary: Daily contact with patient to assess and evaluate symptoms and progress in treatment   35yo M, who presents to [facility] inpatient psych unit due to exacerbation of Schizoaffective disorder, particularly bizarre behavior with agitation in settings of medication noncompliance.  Today patient remains partially-cooperative with care, he was compliant with oral medications, He continues to appear delusional with poor insight and judgement .Continues to refuse LAI. Increase Abilify 15 mg daily. Plan is to transition to long acting injectable. Start Depakene solution 500 mg BID.   Impression: Schizoaffective disorder, bipolar type, current episode - acute mania.  Plan: -continue inpatient psych admission; 15-minute checks; daily contact with patient to assess and evaluate symptoms and progress in treatment; psychoeducation.  -continue scheduled psych medications: . ARIPiprazole  15 mg Oral Daily  . OLANZapine zydis  30 mg Oral QHS   Or  . OLANZapine  15 mg Intramuscular QHS  . valproic acid  500 mg Oral BID     -continue PRN medications. acetaminophen, alum & mag  hydroxide-simeth, chlorproMAZINE (THORAZINE) injection **AND** diphenhydrAMINE, chlorproMAZINE **AND** diphenhydrAMINE, hydrALAZINE, hydrOXYzine, magnesium hydroxide  -Disposition: to be determined. Social Work [employer] on discharge planning for as soon as patient shows some improvement. He is not ready for d/c yet. He will likely needed group home placement.  11/06/20: Psychiatric exam above reviewed and remains accurate. Assessment and plan above reviewed and updated.    [name], MD 11/06/2020, 12:15 PM

## 2020-11-06 NOTE — Plan of Care (Signed)
  Problem: Coping: Goal: Coping ability will improve Outcome: Progressing Goal: Will verbalize feelings Outcome: Progressing   Problem: Education: Goal: Will be free of psychotic symptoms Outcome: Not Progressing Goal: Knowledge of the prescribed therapeutic regimen will improve Outcome: Not Progressing   Problem: Role Relationship: Goal: Ability to communicate needs accurately will improve Outcome: Progressing Goal: Ability to interact with others will improve Outcome: Progressing   Problem: Safety: Goal: Ability to redirect hostility and anger into socially appropriate behaviors will improve Outcome: Not Progressing   Problem: Self-Care: Goal: Ability to participate in self-care as condition permits will improve Outcome: Progressing   Problem: Activity: Goal: Sleeping patterns will improve Outcome: Not Progressing

## 2020-11-06 NOTE — Progress Notes (Signed)
Pt is alert and oriented to person, place, time and situation. Pt is calm, cooperative, denies suicidal and homicidal ideation, denies feelings of anxiety and depression. Pt is noted to be banging on the doors often, security officers respond along with staff and pt requires frequent redirections. Pt has been moving furniture around the hallways, putting his mattress on the floor, and throwing food all over the floors and in the rooms. Pt noted to be responding to internal stimuli, self talk noted. Pt also noted to be laughing to himself. Pt appetite is good. Will continue to monitor pt per Q15 minute face checks and monitor for safety and progress.

## 2020-11-06 NOTE — Progress Notes (Signed)
Patient has been cooperative.  He denies SI  HI AVH  depression anxiety and pain. Although he denies all he is often observed responding to internal stimuli.  Continues with magical thinking and states that he is trying to save the world. He ate snack and took his medications without incident. He remains on 1:1 and will continue to be monitored with sitter and Q 15 minute safety checks.   Cleo Butler-Nicholson, LPN

## 2020-11-07 DIAGNOSIS — F250 Schizoaffective disorder, bipolar type: Secondary | ICD-10-CM | POA: Diagnosis not present

## 2020-11-07 MED ORDER — ARIPIPRAZOLE ER 400 MG IM SRER
400.0000 mg | INTRAMUSCULAR | Status: DC
  Administered 2020-11-07: 400 mg via INTRAMUSCULAR
  Filled 2020-11-07: qty 2

## 2020-11-07 NOTE — B[facility] Group Notes (Signed)
LCSW Group Therapy Note  11/07/2020 2:47 PM  Type of Therapy/Topic:  Group Therapy:  Emotion Regulation  Participation Level:  Did Not Attend   Description of Group:   The purpose of this group is to assist patients in learning to regulate negative emotions and experience positive emotions. Patients will be guided to discuss ways in which they have been vulnerable to their negative emotions. These vulnerabilities will be juxtaposed with experiences of positive emotions or situations, and patients will be challenged to use positive emotions to combat negative ones. Special emphasis will be placed on coping with negative emotions in conflict situations, and patients will process healthy conflict resolution skills.  Therapeutic Goals: 1. Patient will identify two positive emotions or experiences to reflect on in order to balance out negative emotions 2. Patient will label two or more emotions that they find the most difficult to experience 3. Patient will demonstrate positive conflict resolution skills through discussion and/or role plays  Summary of Patient Progress: Patient unable to attend group due to ongoing threat of aggression.     Therapeutic Modalities:   Cognitive Behavioral Therapy Feelings Identification Dialectical Behavioral Therapy  Gwenevere Ghazi, MSW, Bryon Lions 11/07/2020 2:47 PM

## 2020-11-07 NOTE — Progress Notes (Signed)
[facility] MD Progress Note  11/07/2020 1:43 PM Jackson [name]  MRN:  [id number]  Principal Problem: Schizoaffective disorder, bipolar type (HCC) Diagnosis: Principal Problem:   Schizoaffective disorder, bipolar type (HCC) Active Problems:   Noncompliance  Total Time spent with patient: 20 min  CC: Hospital follow-up for patient with schizoaffective disorder, bipolar type.   35yo M, who presents to [facility] inpatient psych unit due to exacerbation of Schizoaffective disorder, particularly bizarre behavior with agitation in settings of medication noncompliance.   SUBJECTIVE: Overnight patient responding to internal stimuli, agitated banging on windows, verbally aggressive with staff. He has strewn food and drink contents across the floor. It also appears he has spit out multiple medications into a cup in his room. This morning patient again verbally and physically aggressive with staff refusing to take oral medications. He was given IM PRNs. He was seen by this provider in presence of security guard for safety. He continues to respond to internal stimuli. He is irritable and glaring at provider. He continues to intermittently whistle and snap his fingers. He endorses paranoid delusions that staff are trying to kill him by giving him corona virus with each shot. Time spent discussing names of medications in PRNs and well as names of schedule medications. He continues to insist that I am lying and trying to kill him. He continues to discuss the illuminati and masons. He continues to feel that his ritual snapping and whistling is helping control minds and shift to alternate dimensions.    Past Psychiatric History: see H&P  Past Medical History:  Past Medical History:  Diagnosis Date  . Bipolar 1 disorder (HCC)   . Schizoaffective disorder (HCC)    History reviewed. No pertinent surgical history. Family History: History reviewed. No pertinent family history. Family Psychiatric  History: see  H&P Social History:  Social History   Substance and Sexual Activity  Alcohol Use Not Currently     Social History   Substance and Sexual Activity  Drug Use Not Currently    Social History   Socioeconomic History  . Marital status: Single    Spouse name: Not on file  . Number of children: Not on file  . Years of education: Not on file  . Highest education level: Not on file  Occupational History  . Not on file  Tobacco Use  . Smoking status: Light Tobacco Smoker    Packs/day: 0.25    Types: Cigarettes  . Smokeless tobacco: Never Used  Vaping Use  . Vaping Use: Never used  Substance and Sexual Activity  . Alcohol use: Not Currently  . Drug use: Not Currently  . Sexual activity: Not on file  Other Topics Concern  . Not on file  Social History Narrative  . Not on file   Social Determinants of Health   Financial Resource Strain: Not on file  Food Insecurity: Not on file  Transportation Needs: Not on file  Physical Activity: Not on file  Stress: Not on file  Social Connections: Not on file   Additional Social History:     Sleep: poor  Appetite:  Good  Current Medications: Current Facility-Administered Medications  Medication Dose Route Frequency Provider Last Rate Last Admin  . acetaminophen (TYLENOL) tablet 650 mg  650 mg Oral Q6H PRN [name], [name], MD      . alum & mag hydroxide-simeth (MAALOX/MYLANTA) 200-200-20 MG/5ML suspension 30 mL  30 mL Oral Q4H PRN [name], [name], MD   30 mL at 11/04/20 0132  .  ARIPiprazole (ABILIFY) tablet 15 mg  15 mg Oral Daily [name], MD   15 mg at 11/06/20 0953  . ARIPiprazole ER (ABILIFY MAINTENA) injection 400 mg  400 mg Intramuscular Q28 days [name], MD   400 mg at 11/07/20 1310  . chlorproMAZINE (THORAZINE) injection 100 mg  100 mg Intramuscular TID PRN [name], MD   100 mg at 11/07/20 0738   And  . diphenhydrAMINE (BENADRYL) injection 50 mg  50 mg Intramuscular TID PRN [name], MD    50 mg at 11/07/20 0739  . chlorproMAZINE (THORAZINE) tablet 100 mg  100 mg Oral TID PRN [name], MD   100 mg at 11/02/20 1610   And  . diphenhydrAMINE (BENADRYL) capsule 50 mg  50 mg Oral TID PRN [name], MD   50 mg at 11/02/20 9604  . hydrALAZINE (APRESOLINE) tablet 10 mg  10 mg Oral Q6H PRN [name], MD      . hydrOXYzine (ATARAX/VISTARIL) tablet 50 mg  50 mg Oral TID PRN [name], [name], MD   50 mg at 11/05/20 2131  . magnesium hydroxide (MILK OF MAGNESIA) suspension 30 mL  30 mL Oral Daily PRN [name], [name], MD      . OLANZapine zydis (ZYPREXA) disintegrating tablet 30 mg  30 mg Oral QHS [name], MD   30 mg at 11/05/20 2131   Or  . OLANZapine (ZYPREXA) injection 15 mg  15 mg Intramuscular QHS [name], MD   15 mg at 11/06/20 2134  . valproic acid (DEPAKENE) 250 MG/5ML solution 500 mg  500 mg Oral BID [name], MD   500 mg at 11/06/20 1652    Lab Results: No results found for this or any previous visit (from the past 48 hour(s)).  Blood Alcohol level:  Lab Results  Component Value Date   ETH <10 10/17/2020   ETH <10 08/11/2020    Metabolic Disorder Labs: Lab Results  Component Value Date   HGBA1C 5.2 10/23/2018   MPG 102.54 10/23/2018   No results found for: PROLACTIN Lab Results  Component Value Date   CHOL 129 10/23/2018   TRIG 58 10/23/2018   HDL 50 10/23/2018   CHOLHDL 2.6 10/23/2018   VLDL 12 10/23/2018   LDLCALC 67 10/23/2018    Physical Findings: AIMS: Facial and Oral Movements Muscles of Facial Expression: None, normal Lips and Perioral Area: None, normal Jaw: None, normal Tongue: None, normal,Extremity Movements Upper (arms, wrists, hands, fingers): None, normal Lower (legs, knees, ankles, toes): None, normal, Trunk Movements Neck, shoulders, hips: None, normal, Overall Severity Severity of abnormal movements (highest score from questions above): None, normal Incapacitation due to abnormal movements:  None, normal Patient's awareness of abnormal movements (rate only patient's report): No Awareness, Dental Status Current problems with teeth and/or dentures?: No Does patient usually wear dentures?: No  CIWA:    COWS:       Musculoskeletal: Strength & Muscle Tone: within normal limits Gait & Station: normal Patient leans: N/A  Psychiatric Specialty Exam: Physical Exam  ROS  Blood pressure (!) 106/93, pulse 100, temperature 98.6 F (37 C), temperature source Oral, resp. rate 18, height 6' (1.829 m), weight 113 kg, SpO2 98 %.  General Appearance: Casual  Eye Contact:  Intense, glaring  Speech: Pressured  Volume: Increased  Mood:  Irritable  Affect:  Guarded  Thought Process:  Illogicial, delusions, paranoia  Orientation:   To person, place, and  time  Thought Content:   Delusions of being part of the illuminati and masons being on the unit trying to poison him. Also feels we are injecting him with covid virus as opposed to medications  Suicidal Thoughts:   Denies  Homicidal Thoughts:   Denies  Memory:  Fair  Judgement:  Poor  Insight:  No  Psychomotor Activity:  Increased, pacing hallway, punching walls,  snapping fingers intermittently  Concentration:   Poor  Recall:   Poor  Fund of Knowledge:  Fair  Language:   Fair  Akathisia:  No  Handed:  Right  AIMS (if indicated):     Assets:   Financial Resources/Insurance Physical Health Resilience Social Support  ADL's:  Impaired  Cognition:  Impaired  Sleep: Poor, 4 hours     Treatment Plan Summary: Daily contact with patient to assess and evaluate symptoms and progress in treatment   [name], who presents to [facility] inpatient psych unit due to exacerbation of Schizoaffective disorder, particularly bizarre behavior with agitation in settings of medication noncompliance.  Today patient remains agitated. Refused oral medications, but accepted IM medication. Delusional with poor insight and judgement. Abilify Maintenna  400 mg IM injection given today. Continue Abilify 15 mg daily for 14 days. Continue Depakene solution 500 mg BID. Continue nonemergent forced medications olanzapine 30 mg QHS or olanzapine 15 mg IM.    Impression: Schizoaffective disorder, bipolar type, current episode - acute mania.  Plan: -continue inpatient psych admission; 15-minute checks; daily contact with patient to assess and evaluate symptoms and progress in treatment; psychoeducation.  -continue scheduled psych medications: . ARIPiprazole  15 mg Oral Daily  . ARIPiprazole ER  400 mg Intramuscular Q28 days  . OLANZapine zydis  30 mg Oral QHS   Or  . OLANZapine  15 mg Intramuscular QHS  . valproic acid  500 mg Oral BID     -continue PRN medications. acetaminophen, alum & mag hydroxide-simeth, chlorproMAZINE (THORAZINE) injection **AND** diphenhydrAMINE, chlorproMAZINE **AND** diphenhydrAMINE, hydrALAZINE, hydrOXYzine, magnesium hydroxide  -Disposition: to be determined. Social Work [employer] on discharge planning for as soon as patient shows some improvement. He is not ready for d/c yet. He will likely needed group home placement.  11/07/20: Psychiatric exam above reviewed and remains accurate. Assessment and plan above reviewed and updated.     [name], MD 11/07/2020, 1:43 PM

## 2020-11-07 NOTE — Progress Notes (Signed)
Patient  sitting naked on the trash can in the bathroom. Patient refused to come out of bathroom for Abilify shot.Patient shouting and cursing at staff states " I don't need the monthly shots." Patient accepted shot with the presence of two security staff. Patient resisted by tightening his arm. Support and encouragement provided.

## 2020-11-07 NOTE — Progress Notes (Signed)
Patient alert and oriented x 2, with periods of confusion to time and situation, affect is blunted thoughts are disorganized and incoherent he appears responding to internal stimuli, agitated banging the windows to locked unit, he was verbally aggressive, restless. Staff went back to his room to medicate him per PRN, he was complaint with medication regimen, patient appeared disheveled, room was noted unkept, it was filthy with plates, trays food and trash scattered all over the room. Patient's was encouraged to take he bath he refused but his room was cleaned. Patient continues to be on 1:1 no distress noted will continue to monitor.

## 2020-11-07 NOTE — Progress Notes (Signed)
Patient appears verbally and physically aggressive by banging the door and yelling. Refused to take PO medications. States " I will take shots." Patient accepted PRN IM medications. Patient talking to himself and responding with gestures. Patient asking for more food over his double portion tray.  Patient trashed his room with food and utensils. Patient is not receptive with staff for cleaning the room or personal hygiene .Support and encouragement given. Safety maintained with 1:1 monitoring.

## 2020-11-07 NOTE — Plan of Care (Signed)
Patient is asking for phone in a very aggressive way " what you think am I in jail where is the phone."  Patient banging on the door very hard and yelled at staff then went back to his room.

## 2020-11-08 DIAGNOSIS — F250 Schizoaffective disorder, bipolar type: Secondary | ICD-10-CM | POA: Diagnosis not present

## 2020-11-08 NOTE — Progress Notes (Addendum)
Pt is alert and oriented to person, place, but not to time or situation. Pt refused morning AM scheduled medications posturing in a threatening manner towards nursing staff with security present to redirect pt. The MD/unit psychiatrist was notified.  Pt is responding heavily to internal stimuli most of the shift thus far, yelling out of his window, making high pitched screeching noises. Pt bangs with his fists at the window and or on the doors intermittently and requires frequent staff redirections, pt is difficult to redirect. Pt's thoughts are disorganized. Will continue to monitor pt per on the back hall locked secured behind double locked and on a 1:1 with staff for safety.   Pt also noted to make repeatedly attempts to destroy property in the back hall, pulling mattresses off beds, and is unable to self care, such as clean after himself, and spilling food and drinks on many surfaces.   Pt took his evening PO liquid medication and flushed it down the toilet.   Two floor staff and two security staff entered the back area where pt's room is and removed from all open rooms behind the double locked doors, all trash items and also removed trash from the hallway, filling one very large 40 gallon plastic trash bag, and removed all dirty scrubs and towels found from the floors. All opened food items, wrappers, and expired or perishable food items were also removed. All styrofoam trays and one hard standard plastic dinner tray was also removed from pt's room. Pt was hiding in the corner of the bathroom, was asked to step out. Pt was compliant with request to step out of his room.   Room needs further follow up from [facility] tomorrow for a mopping. No pests noted. This will be passed on to HS shift in report, and today's charge RN and unit managers are also aware of need for further and continued clean up behind this pt, and same charge RN works tomorrow for further follow up on the need for deeper cleaning/mopping. Pt  unwilling to assist in today's cleaning, ignored staff's request that he pick up his trash. Pt is selectively mute, refused to interact with staff.    Pt remains on a 1:1 for safety.

## 2020-11-08 NOTE — Progress Notes (Signed)
[facility] Second Physician Opinion Progress Note for Medication Administration to Non-consenting Patients (For Involuntarily Committed Patients)  Patient: Jovonte Hegarty Date of Birth: [id number] MRN: [id number]  Reason for the Medication: The patient, without the benefit of the specific treatment measure, is incapable of participating in any available treatment plan that will give the patient a realistic opportunity of improving the patient's condition. There is, without the benefit of the specific treatment measure, a significant possibility that the patient will harm self or others before improvement of the patient's condition is realized.  Consideration of Side Effects: Consideration of the side effects related to the medication plan has been given.  Rationale for Medication Administration: Patient with psychotic disorder.  Poor insight.  During this hospitalization he has been agitated belligerent and violent to the point of injuring staff members.  Remains unwilling to cooperate with recommended medication.  Current medication not making much progress.  Without changes and more aggressive medication he is at high likelihood for dangerous assaultive behavior and is also unlikely to improve to the point that discharge could be considered    Mordecai Rasmussen, MD 11/08/20  5:59 PM   This documentation is good for (7) seven days from the date of the MD signature. New documentation must be completed every seven (7) days with detailed justification in the medical record if the patient requires continued non-emergent administration of psychotropic medications.

## 2020-11-08 NOTE — Progress Notes (Signed)
At 2115 had to be given IM medications due to throwing up medication on prior shifts. Due to non compliance with receiving his injections he had to be held down by security to give IM medications.  Agreed to contract for safety after recieving injections, but then stood up in his bed and became aggressive toward staff and security when told to get down from bed. Hitting, kicking,  spitting and trying to bite at staff and security he was again held down by security. Patient was given two more times to comply and contract for safety but after he refused the third time at 2150 patient was placed in restraint chair for safety of patient and staff. Remains in seclusion room for now with 1:1 for monitoring and safety. Also Q15 minute safety rounds.    Cleo Butler-Nicholson, LPN

## 2020-11-08 NOTE — BHH Counselor (Addendum)
Patient observed yelling, banging on windows, diverting attention of staff from other patients throughout the day. Patient continues to throw food in his room, unknown liquids on floor. Patient yelling at [facility] staff nearing doors. Unable to engage in programing in current mental status.  Security is required for all staff interaction with patient.   Currently, patient is on locked hall alone and severely reducing admission capacity.   Signed:  Corky Crafts, MSW, [place], LCASA 11/08/2020 2:48 PM

## 2020-11-08 NOTE — Progress Notes (Signed)
[facility] MD Progress Note  11/08/2020 10:26 AM Jackson [name]  MRN:  [id number]  Principal Problem: Schizoaffective disorder, bipolar type (HCC) Diagnosis: Principal Problem:   Schizoaffective disorder, bipolar type (HCC) Active Problems:   Noncompliance  Total Time spent with patient: 20 min  CC: Hospital follow-up for patient with schizoaffective disorder, bipolar type.   35yo M, who presents to [facility] inpatient psych unit due to exacerbation of Schizoaffective disorder, particularly bizarre behavior with agitation in settings of medication noncompliance.   SUBJECTIVE: Yesterday evening, patient responding to internal stimuli, agitated banging on windows, verbally aggressive with staff. He has been noncompliant with oral medications. This morning patient seen speaking to the wall, punching in the air at unseen entity, and he also continues to snap his fingers and whistle in a ritual fashion. Upon seeing me patient approaches, but is only partially cooperative with exam. He continues to state he does not have a mental illness, and does not need medications. He continues to feel that staff is trying to poison him, and are consorting with the masons in order to "take him out."    Past Psychiatric History: see H&P  Past Medical History:  Past Medical History:  Diagnosis Date  . Bipolar 1 disorder (HCC)   . Schizoaffective disorder (HCC)    History reviewed. No pertinent surgical history. Family History: History reviewed. No pertinent family history. Family Psychiatric  History: see H&P Social History:  Social History   Substance and Sexual Activity  Alcohol Use Not Currently     Social History   Substance and Sexual Activity  Drug Use Not Currently    Social History   Socioeconomic History  . Marital status: Single    Spouse name: Not on file  . Number of children: Not on file  . Years of education: Not on file  . Highest education level: Not on file  Occupational History   . Not on file  Tobacco Use  . Smoking status: Light Tobacco Smoker    Packs/day: 0.25    Types: Cigarettes  . Smokeless tobacco: Never Used  Vaping Use  . Vaping Use: Never used  Substance and Sexual Activity  . Alcohol use: Not Currently  . Drug use: Not Currently  . Sexual activity: Not on file  Other Topics Concern  . Not on file  Social History Narrative  . Not on file   Social Determinants of Health   Financial Resource Strain: Not on file  Food Insecurity: Not on file  Transportation Needs: Not on file  Physical Activity: Not on file  Stress: Not on file  Social Connections: Not on file   Additional Social History:     Sleep: poor  Appetite:  Good  Current Medications: Current Facility-Administered Medications  Medication Dose Route Frequency Provider Last Rate Last Admin  . acetaminophen (TYLENOL) tablet 650 mg  650 mg Oral Q6H PRN [name], [name], MD      . alum & mag hydroxide-simeth (MAALOX/MYLANTA) 200-200-20 MG/5ML suspension 30 mL  30 mL Oral Q4H PRN [name], [name], MD   30 mL at 11/04/20 0132  . ARIPiprazole (ABILIFY) tablet 15 mg  15 mg Oral Daily [name], MD   15 mg at 11/06/20 0953  . ARIPiprazole ER (ABILIFY MAINTENA) injection 400 mg  400 mg Intramuscular Q28 days [name], MD   400 mg at 11/07/20 1310  . chlorproMAZINE (THORAZINE) injection 100 mg  100 mg Intramuscular TID PRN [name], MD   100  mg at 11/07/20 0738   And  . diphenhydrAMINE (BENADRYL) injection 50 mg  50 mg Intramuscular TID PRN [name], MD   50 mg at 11/07/20 0739  . chlorproMAZINE (THORAZINE) tablet 100 mg  100 mg Oral TID PRN [name], MD   100 mg at 11/02/20 3295   And  . diphenhydrAMINE (BENADRYL) capsule 50 mg  50 mg Oral TID PRN [name], MD   50 mg at 11/02/20 1884  . hydrALAZINE (APRESOLINE) tablet 10 mg  10 mg Oral Q6H PRN [name], MD      . hydrOXYzine (ATARAX/VISTARIL) tablet 50 mg  50 mg Oral TID PRN [name],  [name], MD   50 mg at 11/05/20 2131  . magnesium hydroxide (MILK OF MAGNESIA) suspension 30 mL  30 mL Oral Daily PRN [name], [name], MD      . OLANZapine zydis (ZYPREXA) disintegrating tablet 30 mg  30 mg Oral QHS [name], MD   30 mg at 11/05/20 2131   Or  . OLANZapine (ZYPREXA) injection 15 mg  15 mg Intramuscular QHS [name], MD   15 mg at 11/06/20 2134  . valproic acid (DEPAKENE) 250 MG/5ML solution 500 mg  500 mg Oral BID [name], MD   500 mg at 11/06/20 1652    Lab Results: No results found for this or any previous visit (from the past 48 hour(s)).  Blood Alcohol level:  Lab Results  Component Value Date   ETH <10 10/17/2020   ETH <10 08/11/2020    Metabolic Disorder Labs: Lab Results  Component Value Date   HGBA1C 5.2 10/23/2018   MPG 102.54 10/23/2018   No results found for: PROLACTIN Lab Results  Component Value Date   CHOL 129 10/23/2018   TRIG 58 10/23/2018   HDL 50 10/23/2018   CHOLHDL 2.6 10/23/2018   VLDL 12 10/23/2018   LDLCALC 67 10/23/2018    Physical Findings: AIMS: Facial and Oral Movements Muscles of Facial Expression: None, normal Lips and Perioral Area: None, normal Jaw: None, normal Tongue: None, normal,Extremity Movements Upper (arms, wrists, hands, fingers): None, normal Lower (legs, knees, ankles, toes): None, normal, Trunk Movements Neck, shoulders, hips: None, normal, Overall Severity Severity of abnormal movements (highest score from questions above): None, normal Incapacitation due to abnormal movements: None, normal Patient's awareness of abnormal movements (rate only patient's report): No Awareness, Dental Status Current problems with teeth and/or dentures?: No Does patient usually wear dentures?: No  CIWA:    COWS:       Musculoskeletal: Strength & Muscle Tone: within normal limits Gait & Station: normal Patient leans: N/A  Psychiatric Specialty Exam: Physical Exam  ROS  Blood pressure (!)  106/93, pulse 100, temperature 98.6 F (37 C), temperature source Oral, resp. rate 18, height 6' (1.829 m), weight 113 kg, SpO2 98 %.  General Appearance: Casual  Eye Contact:  Intense, glaring  Speech: Pressured  Volume: Increased  Mood:  Irritable  Affect:  Guarded  Thought Process:  Illogicial, delusions, paranoia  Orientation:   To person, place, and time  Thought Content:   Delusions of being part of the illuminati and masons being on the unit trying to poison him. Also feels we are injecting him with covid virus as opposed to medications  Suicidal Thoughts:   Denies  Homicidal Thoughts:   Denies  Memory:  Fair  Judgement:  Poor  Insight:  No  Psychomotor Activity:  Increased, pacing hallway,  punching at unseen entities,  snapping fingers intermittently  Concentration:   Poor  Recall:   Poor  Fund of Knowledge:  Fair  Language:   Fair  Akathisia:  No  Handed:  Right  AIMS (if indicated):     Assets:   Financial Resources/Insurance Physical Health Resilience Social Support  ADL's:  Impaired  Cognition:  Impaired  Sleep: Fair, 7 hours     Treatment Plan Summary: Daily contact with patient to assess and evaluate symptoms and progress in treatment   [name], who presents to [facility] inpatient psych unit due to exacerbation of Schizoaffective disorder, particularly bizarre behavior with agitation in settings of medication noncompliance.  Today patient remains agitated. He continues to refuse oral medications. Delusional with poor insight and judgement. Abilify Maintenna 400 mg IM injection given 11/07/20. Continue Abilify 15 mg daily until 4/20. Continue Depakene solution 500 mg BID. Continue nonemergent forced medications olanzapine 30 mg QHS or olanzapine 15 mg IM.    Impression: Schizoaffective disorder, bipolar type, current episode - acute mania.  Plan: -continue inpatient psych admission; 15-minute checks; daily contact with patient to assess and evaluate symptoms  and progress in treatment; psychoeducation.  -continue scheduled psych medications: . ARIPiprazole  15 mg Oral Daily  . ARIPiprazole ER  400 mg Intramuscular Q28 days  . OLANZapine zydis  30 mg Oral QHS   Or  . OLANZapine  15 mg Intramuscular QHS  . valproic acid  500 mg Oral BID     -continue PRN medications. acetaminophen, alum & mag hydroxide-simeth, chlorproMAZINE (THORAZINE) injection **AND** diphenhydrAMINE, chlorproMAZINE **AND** diphenhydrAMINE, hydrALAZINE, hydrOXYzine, magnesium hydroxide  -Disposition: to be determined. Social Work [employer] on discharge planning for as soon as patient shows some improvement. He is not ready for d/c yet. He will likely needed group home placement.  11/08/2020 Psychiatric exam above reviewed and remains accurate. Assessment and plan above reviewed and updated.    [name], MD 11/08/2020, 10:26 AM

## 2020-11-08 NOTE — BHH Group Notes (Signed)
LCSW Group Therapy Note     11/08/2020 1:52 PM     Type of Therapy/Topic:  Group Therapy:  Balance in Life     Participation Level:  Did Not Attend     Description of Group:    This group will address the concept of balance and how it feels and looks when one is unbalanced. Patients will be encouraged to process areas in their lives that are out of balance and identify reasons for remaining unbalanced. Facilitators will guide patients in utilizing problem-solving interventions to address and correct the stressor making their life unbalanced. Understanding and applying boundaries will be explored and addressed for obtaining and maintaining a balanced life. Patients will be encouraged to explore ways to assertively make their unbalanced needs known to significant others in their lives, using other group members and facilitator for support and feedback.     Therapeutic Goals:  1.      Patient will identify two or more emotions or situations they have that consume much of in their lives.  2.      Patient will identify signs/triggers that life has become out of balance:  3.      Patient will identify two ways to set boundaries in order to achieve balance in their lives:  4.      Patient will demonstrate ability to communicate their needs through discussion and/or role plays     Summary of Patient Progress:  X    Therapeutic Modalities:   Cognitive Behavioral Therapy  Solution-Focused Therapy  Assertiveness Training     Keysha Damewood [place] MSW, LCSW-A  11/08/2020 1:52 PM

## 2020-11-08 NOTE — Progress Notes (Signed)
Patient alert x 3 with period of confusion to situation, he was not receptive to staff, thoughts are disorganized and incoherent at times, speech is tangential, he appears responding to internal stimuli, he made eye contact and was receptive to staff. Patient was not medicated this evening because patient was sleeping heavily. Patient is currently on 1:1 will continue to monitor closely.

## 2020-11-08 NOTE — BHH Counselor (Signed)
CSW received voicemail from Amor at [facility] to confirm that pt is still at this facility.   CSW attempted to contact him at [phone]. CSW was informed that Amor was busy at the moment and would have to call back. Contact information was left for follow up. No other concerns expressed. Contact ended without an issue.   Vilma Meckel. Algis Greenhouse, MSW, LCSW, LCAS 11/08/2020 11:56 AM

## 2020-11-08 NOTE — Progress Notes (Signed)
Jackson [name] is a 35 year old male with schizoaffective disorder, the bipolar type, who presented to the emergency room on Jan 8th. The patient had an extended stay in the emergency room [facility] the [facility] for [facility] due to intermittent aggression and the need for PRN medications. On this night (04.07.22), I was contacted by [facility] nurse Ms. Butler-Nicholson, LPN, that the patient had become aggressive and he was not redirectable, nor could he contract for safety. The patient had to be placed in bilateral soft wrist and vest restraint while sitting up in the chair. The patient was placed in restraint at 2150. The patient was seen and assessed by this provider at 2135. He is sitting in the restraint chair in no distress, yelling out when I was there. It was reported that the patient had become non-compliance with receiving the injections; he had to be held down by security to give IM medications. Agreed to contract for safety after receiving injections, but then stood up in his bed and became aggressive toward staff and security when told to get down from the bed. Hitting, kicking,  spitting, and trying to bite staff members and security, he was again held down by security. No vitals avail due to the patient aggression towards staff.  I will continue to monitor the patient behavior.

## 2020-11-08 NOTE — BHH Counselor (Signed)
CSW contacted [employer] at [phone], patient's legal guardian, in order to provide updates and discuss discharge planning. CSW updated her to say patient remains on [facility] waitlist, no known progress on referral. During the conversation she was disinterested in any other alternative discharge plan. CSW recommended the guardian to consider other options.   Signed:  Corky Crafts, MSW, [place], LCASA 11/08/2020 11:42 AM

## 2020-11-08 NOTE — Progress Notes (Signed)
Recreation Therapy Notes  Date: 11/08/2020  Time: 9:30 am   Location: Courtyard     Behavioral response: N/A   Intervention Topic: Leisure   Discussion/Intervention: Patient did not attend group.   Clinical Observations/Feedback:  Patient did not attend group.   Brylan Dec LRT/CTRS        Azie Mcconahy 11/08/2020 10:38 AM

## 2020-11-09 NOTE — Progress Notes (Addendum)
Patient accepted his dinner tray and took Depakene with encouragement. Patient has been loudly screaming and pacing the hallway. Patient is responding to internal stimuli, tells this writer to "hold on, I am finishing up a conversation with a friend", as he holds his hand up as a phone.  Patient continues to whistle and snap.
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
## 2020-11-09 NOTE — Progress Notes (Signed)
Patient is banging on walls in the back hall. He continues to periodically scream. Patient remains on 1:1.
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
## 2020-11-09 NOTE — Progress Notes (Signed)
Patient combative with staff and security when MHT was obtaining vitals. Began cursing and spitting at them. Remains in restraint chair with 1:1 sitter, Q15 minute rounds and 30 minute vitals. Will continue to monitor for safety and change in status.   Cleo Butler-Nicholson, LPN

## 2020-11-09 NOTE — Plan of Care (Signed)
  Problem: Education: Goal: Will be free of psychotic symptoms Outcome: Not Progressing Goal: Knowledge of the prescribed therapeutic regimen will improve Outcome: Not Progressing   Problem: Coping: Goal: Coping ability will improve Outcome: Not Progressing Goal: Will verbalize feelings Outcome: Not Progressing   Problem: Coping: Goal: Will verbalize feelings Outcome: Not Progressing   Problem: Health Behavior/Discharge Planning: Goal: Compliance with prescribed medication regimen will improve Outcome: Not Progressing   Problem: Role Relationship: Goal: Ability to communicate needs accurately will improve Outcome: Not Progressing Goal: Ability to interact with others will improve Outcome: Not Progressing   Problem: Role Relationship: Goal: Ability to interact with others will improve Outcome: Not Progressing   Problem: Safety: Goal: Ability to redirect hostility and anger into socially appropriate behaviors will improve Outcome: Not Progressing Goal: Ability to remain free from injury will improve Outcome: Not Progressing

## 2020-11-09 NOTE — Progress Notes (Addendum)
Patient moved back to his room and restraints removed. Patient has finally contracted for safety, appears calm and he is cooperative.  He was provided snack and juice and scrubs so he could take a shower.      Cleo Butler-Nicholson, LPN

## 2020-11-09 NOTE — [facility] Group Notes (Signed)
LCSW Group Therapy Note  11/09/2020 1:54 PM  Type of Therapy and Topic:  Group Therapy:  Feelings around Relapse and Recovery  Participation Level:  Did Not Attend   Description of Group:    Patients in this group will discuss emotions they experience before and after a relapse. They will process how experiencing these feelings, or avoidance of experiencing them, relates to having a relapse. Facilitator will guide patients to explore emotions they have related to recovery. Patients will be encouraged to process which emotions are more powerful. They will be guided to discuss the emotional reaction significant others in their lives may have to their relapse or recovery. Patients will be assisted in exploring ways to respond to the emotions of others without this contributing to a relapse.  Therapeutic Goals: 1. Patient will identify two or more emotions that lead to a relapse for them 2. Patient will identify two emotions that result when they relapse 3. Patient will identify two emotions related to recovery 4. Patient will demonstrate ability to communicate their needs through discussion and/or role plays   Summary of Patient Progress: Patient unable to engage in programing at this time to treat of violence toward others.     Therapeutic Modalities:   Cognitive Behavioral Therapy Solution-Focused Therapy Assertiveness Training Relapse Prevention Therapy   Gwenevere Ghazi, MSW, [place], [place] 11/09/2020 1:54 PM

## 2020-11-09 NOTE — Progress Notes (Signed)
Patient was asked if he would take his medications and at first, patient was agreeable to taking oral medications. 2 RNs and 2 security officers came to bring breakfast tray and medications. Patient threw breakfast tray at RNs and security and tossed medication cup on the ground. Patient is pacing in the hallway and standing by the doorway without a shirt on. Patient periodically screams out. Patient remains on 1:1.

## 2020-11-09 NOTE — Progress Notes (Signed)
Recreation Therapy Notes    Date: 11/09/2020   Time: 2:00pm   Location: Courtyard     Behavioral response: N/A   Intervention Topic: Strengths    Discussion/Intervention: Patient did not attend group.   Clinical Observations/Feedback:  Patient did not attend group.   Tashauna Caisse LRT/CTRS        Sayyid Harewood 11/09/2020 2:31 PM

## 2020-11-09 NOTE — Plan of Care (Signed)
  Problem: Education: Goal: Will be free of psychotic symptoms Outcome: Not Progressing Goal: Knowledge of the prescribed therapeutic regimen will improve Outcome: Not Progressing   Problem: Coping: Goal: Coping ability will improve Outcome: Not Progressing Goal: Will verbalize feelings Outcome: Not Progressing   Problem: Health Behavior/Discharge Planning: Goal: Compliance with prescribed medication regimen will improve Outcome: Not Progressing   Problem: Nutritional: Goal: Ability to achieve adequate nutritional intake will improve Outcome: Not Progressing   Problem: Role Relationship: Goal: Ability to communicate needs accurately will improve Outcome: Not Progressing Goal: Ability to interact with others will improve Outcome: Not Progressing   

## 2020-11-09 NOTE — Progress Notes (Signed)
Patient is asleep, No distress noted. Will continue to monitor.    Cleo Butler-Nicholson, LPN

## 2020-11-10 MED ORDER — CHLORPROMAZINE HCL 100 MG PO TABS: 150.0000 mg | ORAL_TABLET | Freq: Three times a day (TID) | ORAL | Status: DC

## 2020-11-10 MED ORDER — DIPHENHYDRAMINE HCL 50 MG/ML IJ SOLN: 50.0000 mg | Freq: Three times a day (TID) | INTRAMUSCULAR | Status: DC | PRN

## 2020-11-10 MED ORDER — DIPHENHYDRAMINE HCL 25 MG PO CAPS: 50.0000 mg | ORAL_CAPSULE | Freq: Three times a day (TID) | ORAL | Status: DC

## 2020-11-10 MED ORDER — ZIPRASIDONE MESYLATE 20 MG IM SOLR
20.0000 mg | Freq: Four times a day (QID) | INTRAMUSCULAR | Status: DC | PRN
  Administered 2020-11-11: 20 mg via INTRAMUSCULAR
  Filled 2020-11-10 (×4): qty 20

## 2020-11-10 MED ORDER — CHLORPROMAZINE HCL 25 MG/ML IJ SOLN
150.0000 mg | Freq: Three times a day (TID) | INTRAMUSCULAR | Status: DC | PRN
  Filled 2020-11-10: qty 6

## 2020-11-10 MED ORDER — DIPHENHYDRAMINE HCL 50 MG/ML IJ SOLN
50.0000 mg | Freq: Three times a day (TID) | INTRAMUSCULAR | Status: DC
  Administered 2020-11-10 – 2020-11-13 (×8): 50 mg via INTRAMUSCULAR
  Filled 2020-11-10 (×7): qty 1

## 2020-11-10 MED ORDER — VALPROIC ACID 250 MG/5ML PO SOLN
1000.0000 mg | Freq: Two times a day (BID) | ORAL | Status: DC
  Filled 2020-11-10 (×5): qty 20

## 2020-11-10 MED ORDER — CHLORPROMAZINE HCL 25 MG/ML IJ SOLN
200.0000 mg | Freq: Three times a day (TID) | INTRAMUSCULAR | Status: DC
  Administered 2020-11-11 (×3): 200 mg via INTRAMUSCULAR
  Filled 2020-11-10 (×9): qty 8

## 2020-11-10 MED ORDER — CHLORPROMAZINE HCL 25 MG/ML IJ SOLN
150.0000 mg | Freq: Three times a day (TID) | INTRAMUSCULAR | Status: DC
  Administered 2020-11-10 (×2): 150 mg via INTRAMUSCULAR
  Filled 2020-11-10 (×3): qty 6

## 2020-11-10 MED ORDER — CHLORPROMAZINE HCL 100 MG PO TABS
200.0000 mg | ORAL_TABLET | Freq: Three times a day (TID) | ORAL | Status: DC
  Administered 2020-11-12: 200 mg via ORAL
  Filled 2020-11-10 (×2): qty 2

## 2020-11-10 MED ORDER — CHLORPROMAZINE HCL 100 MG PO TABS: 150.0000 mg | ORAL_TABLET | Freq: Three times a day (TID) | ORAL | Status: DC | PRN

## 2020-11-10 MED ORDER — ONDANSETRON HCL 4 MG PO TABS: 4.0000 mg | ORAL_TABLET | Freq: Three times a day (TID) | ORAL | Status: DC | PRN

## 2020-11-10 MED ORDER — DIPHENHYDRAMINE HCL 25 MG PO CAPS
50.0000 mg | ORAL_CAPSULE | Freq: Three times a day (TID) | ORAL | Status: DC
  Administered 2020-11-12 – 2020-11-13 (×2): 50 mg via ORAL
  Filled 2020-11-10 (×4): qty 2

## 2020-11-10 MED ORDER — DIPHENHYDRAMINE HCL 25 MG PO CAPS: 50.0000 mg | ORAL_CAPSULE | Freq: Three times a day (TID) | ORAL | Status: DC | PRN

## 2020-11-10 NOTE — Progress Notes (Signed)
Patient was verbally aggressive and threatening when MHT was feeding the patient.  Patient was screaming and calling staff members verbal names.
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
## 2020-11-10 NOTE — Progress Notes (Signed)
Patient was trying to be intimidating during blood draw, but able to get labs unhindered.  Remains in restraints with 1:1 sitter. Continues with Q15 minute safety rounds and 30 minute vitals.      Jackson Butler-Nicholson, LPN
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
## 2020-11-10 NOTE — Progress Notes (Addendum)
Patient has been progressively cooperative, not yelling, agreeing to be cooperative. Refusing vital signs. Released from the chair at 2203. Patient was taken to his room and was cooperative. He continues on 1:1 for safety.

## 2020-11-10 NOTE — Progress Notes (Signed)
Patient currently in bed sleeping, eyes closed, respirations within defined limits. No sign of distress. Safety monitored on 1:1.

## 2020-11-10 NOTE — Progress Notes (Addendum)
Writer approached patient to assess needs. Patient guarded, shouted "go away b*. Patient remains resistant to care.  Patient received 2 orange juices. Staff continue to monitor for safety on 1:1.

## 2020-11-10 NOTE — Progress Notes (Signed)
Patient has remained in full restraints throughout the day.  He is verbally screaming, using inappropriate language, and attempting to spit on others.  Patient refuses to take his medication by mouth;therefore, meds were given IM today.  We discussed going to 3 limb restraints but he started yelling, screaming, and pushing chair with his feet; therefore, he remained in 4 point restraints.  Patient did eat breakfast tray and 2 fruit cups but refused to eat lunch or dinner.  He refused to take liquids.  Attempted to re-direct patient but he refused to listen to team members.

## 2020-11-10 NOTE — Progress Notes (Signed)
Patient has finally calmed himself. Has agreed to safety contract, but continues to be labile and unpredictable. Currently in 3 point restraint, but will move to 2 point if no behavior to report in the next hour.       Jackson Butler-Nicholson, LPN
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
## 2020-11-10 NOTE — Plan of Care (Signed)
Patient started off with agitations and  increased anxiety, yelling, cursing. Became more and more cooperative, asking to be taken back to his room. Patient received Zyprexa 15 mg IM  and tolerated well. Was released from the chair at 2203 and taken to his room. He is currently calm and cooperative. No physical concern noted or expressed. Food and fluids offered. Safety precautions maintained.

## 2020-11-10 NOTE — [facility] Counselor (Signed)
CSW called [facility] to confirm receipt of information for update on pt. CSW spoke with Deanna who confirmed receipt of updated information and that his case was under review and the pt remains on the priority list. Jennette Kettle stated that the doctor is not in during weekends and will review the updated information on Monday. No other concerns expressed. Contact ended without incident.   Araminta Zorn [place], MSW, LCSW-A 4/9/202210:48 AM

## 2020-11-11 NOTE — Progress Notes (Signed)
Patient is observed to be resting in bed in his room. No sign of distress noted.

## 2020-11-11 NOTE — Progress Notes (Signed)
This writer will relay to oncoming shift that patient has a one time order for an EKG.

## 2020-11-11 NOTE — Progress Notes (Signed)
Patient remains asleep with no sign of discomfort. Safety precautions maintained on 1:1.

## 2020-11-11 NOTE — Progress Notes (Signed)
Patient began repeatedly slamming entire body against back doors, enough to create a gap in between the doors. Patient was asked to stop, but was not redirectable. PRN medications required. Patient is now resting in room. Patient remains on 1:1 for safety.

## 2020-11-11 NOTE — Progress Notes (Signed)
Patient knocked on the doors of the locked hall and when this writer went to see what he needed, patient stated "I want the caucasian lady". This Clinical research associate informed patient that the other nurse is on break, and he stated he would wait. He did not want anything to do with this Clinical research associate. Patient continues to do ritualistic hand motions at the door is looking through this Clinical research associate. Patient remains on 1:1, with his safety sitter present, and is safe on the unit.

## 2020-11-11 NOTE — Progress Notes (Signed)
Patient was given fluids. Patient is responding to internal stimuli and continues to exhibit magical thinking. No sign of distress noted.

## 2020-11-11 NOTE — Progress Notes (Signed)
Patient is quiet and in his room at this time. Patient's safety sitter is present, and he voices no issues or concerns to this writer at this time. Patient remains safe on the unit.

## 2020-11-11 NOTE — Progress Notes (Signed)
Patient remains asleep in bed. Eyes closed, respirations WNL. No sign of distress. Safety monitored on 1:1.

## 2020-11-11 NOTE — Progress Notes (Signed)
Patient aggressively banging on double doors with firsts and elbows in the back hall while making verbal threats.Verbal deescalation was not effective and patient is not redirectable. Two RNs and four security officers were required to enter the back hall and give Thorazine and Benadryl injections. Patient was refusing medications and was uncooperative. Security officers required to get him into a position for RNs to give injections. Patient remains on 1:1.
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
## 2020-11-11 NOTE — Progress Notes (Signed)
Patient verbally refusing medication but did not resist IM Thorazine when 4 security officers and 2 RNs present. Patient is resting in his room. Dinner tray was given to patient.

## 2020-11-11 NOTE — Progress Notes (Signed)
Patient woke up and asked his 1:1 safety sitter for some Gatorade and ice. Patient was informed that there was no Gatorade, however, he was provided with some orange juice. Patient was appreciative and is now walking the hall. Patient remains safe at this time.

## 2020-11-11 NOTE — Progress Notes (Signed)
This writer assumed care of patient at 0300. Per report, patient has been out of restraints since 2203, and has been asleep since. Patient continues to rest quietly in bed, without showing any signs of distress. Patient continues to be on 1:1 observation for aggressive behavior, and remains safe on the unit at this time.
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
## 2020-11-12 MED ORDER — CHLORPROMAZINE HCL 100 MG PO TABS
250.0000 mg | ORAL_TABLET | Freq: Three times a day (TID) | ORAL | Status: DC
  Administered 2020-11-13: 250 mg via ORAL
  Filled 2020-11-12: qty 3

## 2020-11-12 MED ORDER — DIVALPROEX SODIUM 500 MG PO DR TAB
1000.0000 mg | DELAYED_RELEASE_TABLET | Freq: Two times a day (BID) | ORAL | Status: DC
  Administered 2020-11-12 – 2020-11-13 (×2): 1000 mg via ORAL
  Filled 2020-11-12 (×4): qty 2

## 2020-11-12 MED ORDER — CHLORPROMAZINE HCL 25 MG/ML IJ SOLN
250.0000 mg | Freq: Three times a day (TID) | INTRAMUSCULAR | Status: DC
  Administered 2020-11-12: 200 mg via INTRAMUSCULAR
  Administered 2020-11-12 – 2020-11-13 (×2): 250 mg via INTRAMUSCULAR
  Filled 2020-11-12: qty 10

## 2020-11-12 NOTE — Progress Notes (Signed)
Patient visible in the hallway.No aggressive behaviors noted at this time.Refused to take liquid Depakote took rest of the PO medications.Patient refused to open his mouth to show he swallows the pills states " I got it." Paper bag given to patient to put his trash. Denies SI,HI and AVH. Appetite and energy level good. Support and encouragement given.

## 2020-11-12 NOTE — Progress Notes (Signed)
Patient provided scrubs and hygiene products to take shower.  Patient requested to use room 309 as he reports that shower has the hottest water.  Charge nurse allowed for patient to shower in 309.      Jackson Butler-Nicholson, LPN
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
## 2020-11-12 NOTE — Progress Notes (Signed)
Patient started banging on the door continuously and yelling. Not receptive with verbal de escalation. IM medications given with the presence of multiple security staff. Patient accepted medication. Support and encouragement given.

## 2020-11-12 NOTE — Progress Notes (Signed)
Patient is calm and cooperative at this encounter. Verbalized understanding that this nurse will be assigned to him for the night.     Cleo Butler-Nicholson, LPN
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
## 2020-11-12 NOTE — Progress Notes (Signed)
Patient has been pleasant with no behavior issues to report since change of shift.  Patient observed taking his disintegrating Zyprexa orals and this Clinical research associate and the charge nurse stayed and talked to patient and provided food to insure he did not cheek medication.  Patient continues with magical thinking as he is observed periodically snapping fingers and twirling as he responds to internal stimuli.  He continues deny having any SI  HI AVH depression anxiety or pain. He remains on 1:1 with sitter with 15 minute safety checks. Will continue to monitor.       Cleo Butler-Nicholson, LPN

## 2020-11-12 NOTE — Plan of Care (Signed)
  Problem: Education: Goal: Will be free of psychotic symptoms Outcome: Not Progressing   Problem: Coping: Goal: Coping ability will improve Outcome: Not Progressing   Problem: Health Behavior/Discharge Planning: Goal: Compliance with prescribed medication regimen will improve Outcome: Not Progressing   Problem: Coping: Goal: Ability to verbalize frustrations and anger appropriately will improve Outcome: Not Progressing   Problem: Coping: Goal: Ability to demonstrate self-control will improve Outcome: Not Progressing

## 2020-11-12 NOTE — [facility] Group Notes (Signed)
LCSW Group Therapy Note   11/12/2020 1:52 PM  Type of Therapy and Topic:  Group Therapy:  Overcoming Obstacles   Participation Level:  Did Not Attend   Description of Group:    In this group patients will be encouraged to explore what they see as obstacles to their own wellness and recovery. They will be guided to discuss their thoughts, feelings, and behaviors related to these obstacles. The group will process together ways to cope with barriers, with attention given to specific choices patients can make. Each patient will be challenged to identify changes they are motivated to make in order to overcome their obstacles. This group will be process-oriented, with patients participating in exploration of their own experiences as well as giving and receiving support and challenge from other group members.   Therapeutic Goals: 1. Patient will identify personal and current obstacles as they relate to admission. 2. Patient will identify barriers that currently interfere with their wellness or overcoming obstacles.  3. Patient will identify feelings, thought process and behaviors related to these barriers. 4. Patient will identify two changes they are willing to make to overcome these obstacles:      Summary of Patient Progress X    Therapeutic Modalities:   Cognitive Behavioral Therapy Solution Focused Therapy Motivational Interviewing Relapse Prevention Therapy  Jackson [name] R. Algis Greenhouse, MSW, LCSW, LCAS 11/12/2020 1:52 PM

## 2020-11-12 NOTE — Progress Notes (Signed)
Recreation Therapy Notes    Date: 11/12/2020  Time: 10:00 am   Location: Craft room    Behavioral response: N/A   Intervention Topic: Self-care   Discussion/Intervention: Patient did not attend group.   Clinical Observations/Feedback:  Patient did not attend group.   Liberty Stead LRT/CTRS        Rahman Ferrall 11/12/2020 12:08 PM

## 2020-11-12 NOTE — Plan of Care (Signed)
  Problem: Education: Goal: Will be free of psychotic symptoms Outcome: Not Progressing   Patient continues with magical thinking and observed response to AH.   Jackson Butler-Nicholson, LPN

## 2020-11-12 NOTE — Plan of Care (Signed)
Took P.O medications to patients room with security.Patient took it and threw it on the floor. IM medications given with unit manager and more security.No aggressive behaviors noted at this time.Support and encouragement given.

## 2020-11-12 NOTE — Progress Notes (Signed)
[facility] MD Progress Note  11/12/2020 1:05 PM Jackson [name]  MRN:  [id number]   CC "Why yall giving me so many shots?"  Subjective:  [name], who presents to [facility] inpatient psych unit due to exacerbation of Schizoaffective disorder, particularly bizarre behavior with agitation in settings of medication noncompliance.  No acute events overnight. This morning patient verbally agitated and banging on door again. Door partially broken with widening gap. All other patient moved to another hall behind the next set of double doors for safety. He was unable to be verbally redirected from door, and he was given IM injections for agitation by Publishing copy and charge nurse in presence of 10 security officers. Updated notes have been sent to [facility] for review for admission.   Principal Problem: Schizoaffective disorder, bipolar type (HCC) Diagnosis: Principal Problem:   Schizoaffective disorder, bipolar type (HCC) Active Problems:   Noncompliance  Total Time spent with patient: 30 minutes  Past Psychiatric History: See H&P  Past Medical History:  Past Medical History:  Diagnosis Date  . Bipolar 1 disorder (HCC)   . Schizoaffective disorder (HCC)    History reviewed. No pertinent surgical history. Family History: History reviewed. No pertinent family history. Family Psychiatric  History: See H&P Social History:  Social History   Substance and Sexual Activity  Alcohol Use Not Currently     Social History   Substance and Sexual Activity  Drug Use Not Currently    Social History   Socioeconomic History  . Marital status: Single    Spouse name: Not on file  . Number of children: Not on file  . Years of education: Not on file  . Highest education level: Not on file  Occupational History  . Not on file  Tobacco Use  . Smoking status: Light Tobacco Smoker    Packs/day: 0.25    Types: Cigarettes  . Smokeless tobacco: Never Used  Vaping Use  . Vaping Use: Never used   Substance and Sexual Activity  . Alcohol use: Not Currently  . Drug use: Not Currently  . Sexual activity: Not on file  Other Topics Concern  . Not on file  Social History Narrative  . Not on file   Social Determinants of Health   Financial Resource Strain: Not on file  Food Insecurity: Not on file  Transportation Needs: Not on file  Physical Activity: Not on file  Stress: Not on file  Social Connections: Not on file   Additional Social History:    Sleep: Poor  Appetite:  Good  Current Medications: Current Facility-Administered Medications  Medication Dose Route Frequency Provider Last Rate Last Admin  . acetaminophen (TYLENOL) tablet 650 mg  650 mg Oral Q6H PRN [name], [name], MD      . alum & mag hydroxide-simeth (MAALOX/MYLANTA) 200-200-20 MG/5ML suspension 30 mL  30 mL Oral Q4H PRN [name], [name], MD   30 mL at 11/04/20 0132  . ARIPiprazole (ABILIFY) tablet 15 mg  15 mg Oral Daily [name], MD   15 mg at 11/12/20 0743  . ARIPiprazole ER (ABILIFY MAINTENA) injection 400 mg  400 mg Intramuscular Q28 days [name], MD   400 mg at 11/07/20 1310  . chlorproMAZINE (THORAZINE) tablet 250 mg  250 mg Oral TID [name], MD       Or  . chlorproMAZINE (THORAZINE) injection 250 mg  250 mg Intramuscular TID [name], MD   200 mg at 11/12/20 1129  . diphenhydrAMINE (  BENADRYL) capsule 50 mg  50 mg Oral TID [name], MD   50 mg at 11/12/20 0743   Or  . diphenhydrAMINE (BENADRYL) injection 50 mg  50 mg Intramuscular TID [name], MD   50 mg at 11/12/20 1129  . divalproex (DEPAKOTE) DR tablet 1,000 mg  1,000 mg Oral Q12H [name], MD      . hydrALAZINE (APRESOLINE) tablet 10 mg  10 mg Oral Q6H PRN [name], MD      . hydrOXYzine (ATARAX/VISTARIL) tablet 50 mg  50 mg Oral TID PRN [name], [name], MD   50 mg at 11/11/20 2124  . magnesium hydroxide (MILK OF MAGNESIA) suspension 30 mL  30 mL Oral Daily PRN [name], [name], MD       . OLANZapine zydis (ZYPREXA) disintegrating tablet 30 mg  30 mg Oral QHS [name], MD   30 mg at 11/11/20 2124   Or  . OLANZapine (ZYPREXA) injection 15 mg  15 mg Intramuscular QHS [name], MD   15 mg at 11/10/20 2042  . ondansetron (ZOFRAN) tablet 4 mg  4 mg Oral Q8H PRN [name], MD      . ziprasidone (GEODON) injection 20 mg  20 mg Intramuscular Q6H PRN [name], MD   20 mg at 11/11/20 1100    Lab Results: No results found for this or any previous visit (from the past 48 hour(s)).  Blood Alcohol level:  Lab Results  Component Value Date   ETH <10 10/17/2020   ETH <10 08/11/2020    Metabolic Disorder Labs: Lab Results  Component Value Date   HGBA1C 5.2 10/23/2018   MPG 102.54 10/23/2018   No results found for: PROLACTIN Lab Results  Component Value Date   CHOL 129 10/23/2018   TRIG 58 10/23/2018   HDL 50 10/23/2018   CHOLHDL 2.6 10/23/2018   VLDL 12 10/23/2018   LDLCALC 67 10/23/2018    Physical Findings: AIMS: Facial and Oral Movements Muscles of Facial Expression: None, normal Lips and Perioral Area: None, normal Jaw: None, normal Tongue: None, normal,Extremity Movements Upper (arms, wrists, hands, fingers): None, normal Lower (legs, knees, ankles, toes): None, normal, Trunk Movements Neck, shoulders, hips: None, normal, Overall Severity Severity of abnormal movements (highest score from questions above): None, normal Incapacitation due to abnormal movements: None, normal Patient's awareness of abnormal movements (rate only patient's report): No Awareness, Dental Status Current problems with teeth and/or dentures?: No Does patient usually wear dentures?: No  CIWA:    COWS:     Musculoskeletal: Strength & Muscle Tone: within normal limits Gait & Station: normal Patient leans: N/A  Psychiatric Specialty Exam:  Presentation  General Appearance: Disheveled  Eye Contact:Fleeting  Speech:Normal Rate  Speech  Volume:Increased  Handedness:Right   Mood and Affect  Mood:Angry; Irritable  Affect:Congruent   Thought Process  Thought Processes:Disorganized  Descriptions of Associations:Loose  Orientation:Full (Time, Place and Person)  Thought Content:Delusions; Paranoid Ideation; Tangential  History of Schizophrenia/Schizoaffective disorder:Yes  Duration of Psychotic Symptoms:Greater than six months  Hallucinations:No data recorded  Ideas of Reference:Delusions  Suicidal Thoughts:No data recorded  Homicidal Thoughts:No data recorded   Sensorium  Memory:Immediate Poor; Recent Poor; Remote Poor  Judgment:Impaired  Insight:Lacking   Executive Functions  Concentration:Poor  Attention Span:Fair  Recall:Poor  Fund of Knowledge:Fair  Language:Fair   Psychomotor Activity  Psychomotor Activity:No data recorded   Assets  Assets:Financial Resources/Insurance; Resilience; Social Support   Sleep  Sleep: Fair,  6 hours   Physical Exam: Physical Exam  ROS  Blood pressure 106/60, pulse 79, temperature 97.7 F (36.5 C), resp. rate 18, height 6' (1.829 m), weight 113 kg, SpO2 100 %. Body mass index is 33.79 kg/m.   Treatment Plan Summary: Daily contact with patient to assess and evaluate symptoms and progress in treatment and Medication management  [name], who presents to [facility] inpatient psych unit due to exacerbation of Schizoaffective disorder, particularly bizarre behavior with agitation in settings of medication noncompliance.  Today patient is in back hall secluded from the rest of the unit with 1:1 sitter for safety. He remains quite agitated and this morning has continued to try and break down seclusion door.  He was given PRNs for agitation this morning. He continues to refuse oral medications. Delusional with poor insight and judgement. Abilify Maintenna 400 mg IM injection given 11/07/20. Continue Abilify 15 mg daily until 4/20. Continue to encourage  Depakote 1000 mg BID. Increase nonemergent forced medications thorazine 250 mg TID with benadryl 50 mg TID IM or PO.  Continue nonemergent forced medications olanzapine 30 mg QHS or olanzapine 15 mg IM.    Impression: Schizoaffective disorder, bipolar type, current episode - acute mania.  Plan: -continue inpatient psych admission; 15-minute checks; daily contact with patient to assess and evaluate symptoms and progress in treatment; psychoeducation.   -continue PRN medications. acetaminophen, alum & mag hydroxide-simeth, chlorproMAZINE (THORAZINE) injection **AND** diphenhydrAMINE, chlorproMAZINE **AND** diphenhydrAMINE, hydrALAZINE, hydrOXYzine, magnesium hydroxide  -Disposition: to be determined.Social Work [employer] on discharge planning for as soon as patient shows some improvement. He is not ready for d/c yet. He will likely needed group home placement.  11/12/20: Psychiatric exam above reviewed and remains accurate. Assessment and plan above reviewed and updated.   [name], MD 11/12/2020, 1:05 PM

## 2020-11-13 LAB — RESP PANEL BY RT-PCR (FLU A&B, COVID) ARPGX2
Influenza A by PCR: NEGATIVE
Influenza B by PCR: NEGATIVE
SARS Coronavirus 2 by RT PCR: NEGATIVE

## 2020-11-13 MED ORDER — DIVALPROEX SODIUM 500 MG PO DR TAB: 1000.0000 mg | DELAYED_RELEASE_TABLET | Freq: Two times a day (BID) | ORAL | 1 refills | Status: DC

## 2020-11-13 MED ORDER — ARIPIPRAZOLE 15 MG PO TABS: 15.0000 mg | ORAL_TABLET | Freq: Every day | ORAL | 0 refills | Status: DC

## 2020-11-13 MED ORDER — ARIPIPRAZOLE ER 400 MG IM SRER: 400.0000 mg | INTRAMUSCULAR | Status: DC

## 2020-11-13 MED ORDER — OLANZAPINE 15 MG PO TBDP: 30.0000 mg | ORAL_TABLET | Freq: Every day | ORAL | 0 refills | Status: DC

## 2020-11-13 MED ORDER — CHLORPROMAZINE HCL 50 MG PO TABS: 250.0000 mg | ORAL_TABLET | Freq: Three times a day (TID) | ORAL | 0 refills | Status: DC

## 2020-11-13 NOTE — Progress Notes (Signed)
Patient was cooperative with getting vital signs and COVID swab taken with 4 security guards and 2 RNs present. However, patient still stated he did not want to take medications. Patient slightly resisted medications but eventually let this writer give IM Thorazine and Benadryl injections. Breakfast tray and fluids given. Patient is resting in room and remains on 1:1.

## 2020-11-13 NOTE — Progress Notes (Addendum)
Recreation Therapy Notes    Date: 11/13/2020   Time: 9:30 am   Location: Craft room    Behavioral response: N/A   Intervention Topic: Wellness    Discussion/Intervention: Patient did not attend group.   Clinical Observations/Feedback:  Patient did not attend group.   Kimm Ungaro LRT/CTRS          Connee Ikner 11/13/2020 11:12 AM

## 2020-11-13 NOTE — Progress Notes (Signed)
Patient took oral Thorazine and Benadryl. Patient was asked to swallow multiple times and mouth was examined afterwards. Patient was picked up by sheriff and left unit at 1:10pm. Patient was not observed to be in distress at the time of discharge.

## 2020-11-13 NOTE — Progress Notes (Signed)
  [facility] Adult Case Management Discharge Plan :  Will you be returning to the same living situation after discharge:  No. At discharge, do you have transportation home?: Yes,  Patient to be transported by sherrif to [facility].  Do you have the ability to pay for your medications: Yes,  [employer] [employer]  Release of information consent forms completed and in the chart;  Patient's signature needed at discharge.  Patient to Follow up at:   Next level of care provider has access to [facility] Link:no  Safety Planning and Suicide Prevention discussed: Yes,  SPE completed with legal guardian     Has patient been referred to the Quitline?: Patient refused referral  Patient has been referred for addiction treatment: N/A  Corky Crafts, LCSWA 11/13/2020, 8:58 AM

## 2020-11-13 NOTE — Plan of Care (Signed)
  Problem: Group Participation Goal: STG - Patient will engage in groups with a calm and appropriate mood at least 2x within 5 recreation therapy group [place] Description: STG - Patient will engage in groups with a calm and appropriate mood at least 2x within 5 recreation therapy group [place] 11/13/2020 1158 by Ernest Haber, LRT Outcome: Not Applicable 01/23/2978 [id number] by Ernest Haber, LRT Outcome: Not Met (add Reason) Note: Patient was aggressive with peers and staff
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
## 2020-11-13 NOTE — Progress Notes (Signed)
   11/13/20 1205  Section 1: Provider Certification  Patient Condition Patient stabilized  Reason for Transfer Higher level of care  Benefits of Transfer Higher level of care  Risks of Transfer Minimal  Level of Care Other (Comment) Management consultant transport)  Accepting Physician Deanna Mangum  Sending Physician Dr. Les Pou  Physician Assessment Patient examined and risks and benefits explained  Section 2: Clinician Certification  Accepting hospital or facility Available space confirmed;Available personnel confirmed;Available service confirmed  Accepting [place]  Transfer Coordinator - name, # Gwenevere Ghazi [phone]  Date report called 11/13/20  Transport By Other (Comment) Management consultant)  Copies of Medical Records Sent Transfer Form;Nursing Note;Labs;Progress Note;Original 515;Med Rec Form  Patient Belongings Disposition Other (Comment) (Patient had no belongings)  E - Vitals (15 min before transfer)  Temp 98.7 F (37.1 C)  Pulse Rate (!) 109  Resp 18  BP 110/84  SpO2 98 %  O2 (L/min)  (room air)  Section 4: Provider Reassessment (within 1 hour of departure)  Physician Reassessment Reassessment completed prior to transfer (Provider Only)

## 2020-11-13 NOTE — Progress Notes (Signed)
Recreation Therapy Notes  INPATIENT RECREATION TR PLAN  Patient Details Name: Jackson [name] MRN: [id number] DOB: 08-May-1986 Today's Date: 11/13/2020  Rec Therapy Plan Is patient appropriate for Therapeutic Recreation?: Yes Treatment times per week: at least 3 Estimated Length of Stay: 5-7 days TR Treatment/Interventions: Group participation (Comment)  Discharge Criteria Pt will be discharged from therapy if:: Discharged Treatment plan/goals/alternatives discussed and agreed upon by:: Patient/family  Discharge Summary Short term goals set: Patient will engage in groups with a calm and appropriate mood at least 2x within 5 recreation therapy group Jackson [name] Short term goals met: Not met Progress toward goals comments: Groups attended Which groups?: AAA/T,Leisure education Reason goals not met: Patien was agressive to peers and staff Therapeutic equipment acquired: N/A Reason patient discharged from therapy: Discharge from hospital Pt/family agrees with progress & goals achieved: Yes Date patient discharged from therapy: 11/13/20   Jackson [name] 11/13/2020, 12:01 PM

## 2020-11-13 NOTE — BHH Suicide Risk Assessment (Signed)
[facility] Discharge Suicide Risk Assessment   Principal Problem: Schizoaffective disorder, bipolar type [facility]) Discharge Diagnoses: Principal Problem:   Schizoaffective disorder, bipolar type (HCC) Active Problems:   Noncompliance   Total Time spent with patient: 35 minutes- 25 minutes face-to-face contact with patient, 10 minutes documentation, coordination of care, scripts   Musculoskeletal: Strength & Muscle Tone: within normal limits Gait & Station: normal Patient leans: N/A  Psychiatric Specialty Exam: Review of Systems  Constitutional: Positive for fatigue. Negative for fever.  HENT: Negative.   Eyes: Negative.   Respiratory: Negative.   Cardiovascular: Negative.   Gastrointestinal: Negative.   Endocrine: Negative.   Genitourinary: Negative.   Musculoskeletal: Negative.   Skin: Negative.   Allergic/Immunologic: Negative.   Neurological: Negative.   Hematological: Negative.   Psychiatric/Behavioral: Positive for agitation, behavioral problems and hallucinations.    Blood pressure 110/84, pulse (!) 109, temperature 98.7 F (37.1 C), temperature source Oral, resp. rate 16, height 6' (1.829 m), weight 113 kg, SpO2 98 %.Body mass index is 33.79 kg/m.  General Appearance: Casual  Eye Contact::  Fair  Speech:  Pressured  Volume:  Increased  Mood:  Irritable  Affect:  Congruent  Thought Process:  Disorganized  Orientation:  Full (Time, Place, and Person)  Thought Content:  Illogical, Delusions, Hallucinations: Auditory and Paranoid Ideation  Suicidal Thoughts:  No  Homicidal Thoughts:  No  Memory:  Immediate;   Fair Recent;   Poor Remote;   Poor  Judgement:  Impaired  Insight:  Lacking  Psychomotor Activity:  Normal  Concentration:  Fair  Recall:  [employer] of Knowledge:Fair  Language: Fair  Akathisia:  Negative  Handed:  Right  AIMS (if indicated):       Assets:  Desire for Improvement Financial Resources/Insurance Housing Resilience Social Support  Sleep:   Number of Hours: 6.25  Cognition: WNL  ADL's:  Intact   Mental Status Per Nursing Assessment::   On Admission:  NA  Demographic Factors:  Male, Living alone and Unemployed  Loss Factors: NA  Historical Factors: Impulsivity  Risk Reduction Factors:   Positive social support  Continued Clinical Symptoms:  Bipolar Disorder:   Mixed State Currently Psychotic Unstable or Poor Therapeutic Relationship Previous Psychiatric Diagnoses and Treatments  Cognitive Features That Contribute To Risk:  Closed-mindedness and Loss of executive function    Suicide Risk:  Mild:  Suicidal ideation of limited frequency, intensity, duration, and specificity.  There are no identifiable plans, no associated intent, mild dysphoria and related symptoms, good self-control (both objective and subjective assessment), few other risk factors, and identifiable protective factors, including available and accessible social support.    Plan Of Care/Follow-up recommendations:  Activity:  as tolerated Diet:  regular diet  [name], MD 11/13/2020, 8:20 AM

## 2020-11-13 NOTE — Progress Notes (Signed)
Since start of shift patient has had no outburst and has not banged on the door.  Denies having any SI HI AVH depression anxiety or pain, but can be observed in the hall way pacing snapping fingers and responding to internal stimuli.  Patient was initially offered PO medication this eveining, but looked at the cup and said " I'd rather take the shot"  "Go get the shot." By refusing the oral medication patient refused to take newly ordered PO Depakote. 15mg  IM Zyprexa was drawn and then administered. Security and police present while administering injection, and patient received injection without incident.  Patient also provided with clean scrubs,  toiletries and snack.  Patient remains on back hall on 1:1 with sitter and Q 15 minute rounds.    Cleo Butler-Nicholson, LPN

## 2021-01-20 ENCOUNTER — Emergency Department
Admission: EM | Admit: 2021-01-20 | Discharge: 2021-02-05 | Disposition: A | Discharge status: Home / Self Care | Payer: No Typology Code available for payment source | Attending: Emergency Medicine | Admitting: Emergency Medicine

## 2021-01-20 DIAGNOSIS — F250 Schizoaffective disorder, bipolar type: Secondary | ICD-10-CM | POA: Insufficient documentation

## 2021-01-20 DIAGNOSIS — Z20822 Contact with and (suspected) exposure to covid-19: Secondary | ICD-10-CM | POA: Insufficient documentation

## 2021-01-20 DIAGNOSIS — Y900 Blood alcohol level of less than 20 mg/100 ml: Secondary | ICD-10-CM | POA: Insufficient documentation

## 2021-01-20 DIAGNOSIS — R456 Violent behavior: Secondary | ICD-10-CM | POA: Insufficient documentation

## 2021-01-20 DIAGNOSIS — Z046 Encounter for general psychiatric examination, requested by authority: Secondary | ICD-10-CM | POA: Insufficient documentation

## 2021-01-20 DIAGNOSIS — F17210 Nicotine dependence, cigarettes, uncomplicated: Secondary | ICD-10-CM | POA: Diagnosis not present

## 2021-01-20 LAB — CBC
HCT: 40.6 % (ref 39.0–52.0)
Hemoglobin: 13.6 g/dL (ref 13.0–17.0)
MCH: 31.6 pg (ref 26.0–34.0)
MCHC: 33.5 g/dL (ref 30.0–36.0)
MCV: 94.4 fL (ref 80.0–100.0)
Platelets: 291 10*3/uL (ref 150–400)
RBC: 4.3 MIL/uL (ref 4.22–5.81)
RDW: 13.7 % (ref 11.5–15.5)
WBC: 5.6 10*3/uL (ref 4.0–10.5)
nRBC: 0 % (ref 0.0–0.2)

## 2021-01-20 LAB — COMPREHENSIVE METABOLIC PANEL
ALT: 13 U/L (ref 0–44)
AST: 50 U/L — ABNORMAL HIGH (ref 15–41)
Albumin: 4.7 g/dL (ref 3.5–5.0)
Alkaline Phosphatase: 52 U/L (ref 38–126)
Anion gap: 7 (ref 5–15)
BUN: 19 mg/dL (ref 6–20)
CO2: 23 mmol/L (ref 22–32)
Calcium: 9.4 mg/dL (ref 8.9–10.3)
Chloride: 109 mmol/L (ref 98–111)
Creatinine, Ser: 1.26 mg/dL — ABNORMAL HIGH (ref 0.61–1.24)
GFR, Estimated: >60 mL/min (ref >60)
Glucose, Bld: 114 mg/dL — ABNORMAL HIGH (ref 70–99)
Potassium: 3.7 mmol/L (ref 3.5–5.1)
Sodium: 139 mmol/L (ref 135–145)
Total Bilirubin: 0.9 mg/dL (ref 0.3–1.2)
Total Protein: 7.7 g/dL (ref 6.5–8.1)

## 2021-01-20 LAB — ACETAMINOPHEN LEVEL: Acetaminophen (Tylenol), Serum: <10 ug/mL — ABNORMAL LOW (ref 10–30)

## 2021-01-20 LAB — SALICYLATE LEVEL: Salicylate Lvl: <7 mg/dL — ABNORMAL LOW (ref 7.0–30.0)

## 2021-01-20 LAB — ETHANOL: Alcohol, Ethyl (B): <10 mg/dL (ref <10)

## 2021-01-20 MED ORDER — DIVALPROEX SODIUM 500 MG PO DR TAB
1000.0000 mg | DELAYED_RELEASE_TABLET | Freq: Once | ORAL | Status: DC
  Filled 2021-01-20: qty 2

## 2021-01-20 MED ORDER — ZIPRASIDONE MESYLATE 20 MG IM SOLR
20.0000 mg | Freq: Once | INTRAMUSCULAR | Status: DC
  Filled 2021-01-20: qty 20

## 2021-01-20 MED ORDER — OLANZAPINE 10 MG PO TBDP
30.0000 mg | ORAL_TABLET | Freq: Once | ORAL | Status: DC
  Filled 2021-01-20: qty 6

## 2021-01-20 MED ORDER — CHLORPROMAZINE HCL 25 MG PO TABS
250.0000 mg | ORAL_TABLET | Freq: Once | ORAL | Status: DC
  Filled 2021-01-20: qty 1

## 2021-01-20 NOTE — Discharge Instructions (Addendum)
Follow up with [facility] team 
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
## 2021-01-20 NOTE — ED Notes (Signed)
Patient refusing oral medications at this time, MD made aware.  Plan is to monitor closely.

## 2021-01-20 NOTE — ED Notes (Signed)
Patient to rm 20.  Patient cooperative at this time.  Patient responding to internal stimuli.  Patient agreed to change into hospital scrubs and allow Megan, RN to draw blood.  Per [employer] with patient patient has been very paranoid tonight.

## 2021-01-21 DIAGNOSIS — F250 Schizoaffective disorder, bipolar type: Secondary | ICD-10-CM

## 2021-01-21 LAB — RESP PANEL BY RT-PCR (FLU A&B, COVID) ARPGX2
Influenza A by PCR: NEGATIVE
Influenza B by PCR: NEGATIVE
SARS Coronavirus 2 by RT PCR: NEGATIVE

## 2021-01-21 MED ORDER — LORAZEPAM 2 MG/ML IJ SOLN
2.0000 mg | Freq: Once | INTRAMUSCULAR | Status: AC
  Administered 2021-01-21: 2 mg via INTRAMUSCULAR
  Filled 2021-01-21: qty 1

## 2021-01-21 MED ORDER — BENZTROPINE MESYLATE 1 MG PO TABS
1.0000 mg | ORAL_TABLET | Freq: Two times a day (BID) | ORAL | Status: DC
  Administered 2021-01-22 – 2021-02-05 (×23): 1 mg via ORAL
  Filled 2021-01-21 (×27): qty 1

## 2021-01-21 MED ORDER — DIVALPROEX SODIUM 500 MG PO DR TAB
1000.0000 mg | DELAYED_RELEASE_TABLET | Freq: Two times a day (BID) | ORAL | Status: DC
  Administered 2021-01-22 – 2021-02-05 (×23): 1000 mg via ORAL
  Filled 2021-01-21 (×27): qty 2

## 2021-01-21 MED ORDER — DIPHENHYDRAMINE HCL 50 MG/ML IJ SOLN
50.0000 mg | Freq: Once | INTRAMUSCULAR | Status: AC
  Administered 2021-01-21: 50 mg via INTRAMUSCULAR

## 2021-01-21 MED ORDER — HALOPERIDOL LACTATE 5 MG/ML IJ SOLN
5.0000 mg | Freq: Once | INTRAMUSCULAR | Status: AC
  Administered 2021-01-21: 5 mg via INTRAMUSCULAR

## 2021-01-21 MED ORDER — OLANZAPINE 5 MG PO TABS
15.0000 mg | ORAL_TABLET | Freq: Two times a day (BID) | ORAL | Status: DC
  Administered 2021-01-22 – 2021-02-05 (×23): 15 mg via ORAL
  Filled 2021-01-21 (×27): qty 1

## 2021-01-21 NOTE — ED Notes (Signed)
Psychiatrist at bedside

## 2021-01-21 NOTE — ED Notes (Signed)
Pt awake and to restroom now
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
## 2021-01-21 NOTE — ED Notes (Signed)
Patient at door way Insurance risk surveyor.  This RN spoke with patient ask if he would take medication to help him relax.  Patient refused, stated that he was powering the world with his body heat.  Patient asked to lay down on bed and stop coming to the door way at first refused then went into room and shut door.

## 2021-01-21 NOTE — ED Notes (Signed)
Pt back to room from bathroom.  

## 2021-01-21 NOTE — ED Notes (Signed)
Pt resting in bed. Pt received sandwich tray for snack..  Pt says he is very cold. Asked for second set of scrubs. RN agreed he could have an extra shirt. Tech gave extra blankets as well.  Pt told he could not shower tonight but can first thing in morning when asked.  Tech turned light off and told pt to get some rest.
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
## 2021-01-21 NOTE — ED Notes (Signed)
Pt continues to refuse meds, does not feel he needs them

## 2021-01-21 NOTE — ED Provider Notes (Signed)
Emergency Medicine Observation Re-evaluation Note  Jackson [name] is a 35 y.o. male, seen on rounds today.  Pt initially presented to the ED for complaints of No chief complaint on file. Currently, the patient is resting, voices no medical complaints.  Physical Exam  BP (!) 143/97 (BP Location: Right Arm)   Pulse 81   Temp 97.8 F (36.6 C) (Oral)   Resp 20   SpO2 97%  Physical Exam General: Resting in no acute distress Cardiac: No cyanosis Lungs: Equal rise and fall Psych: Not agitated  ED Course / MDM  EKG:   I have reviewed the labs performed to date as well as medications administered while in observation.  Recent changes in the last 24 hours include patient received IM calming agents for agitation with success.  Plan  Current plan is for psychiatric disposition. Patient is under full IVC at this time.   Irean Hong, MD 01/21/21 [phone]

## 2021-01-21 NOTE — ED Notes (Signed)
Pt standing in doorway whistling. Ate 100% of dinner tray.

## 2021-01-21 NOTE — ED Notes (Signed)
Pt also continues to refuse vitals

## 2021-01-21 NOTE — ED Notes (Signed)
Pt is out of bathroom, pt spinning in circles and appears to be dancing some and whistling. Pt was doing this in hallway but follows commands and returns to room when asked. Pt complains that it is cold in room and feels warm in hall and he wants to feel the warmth, pt informed that he can keep door open but with unit rules needs to stay in room at this time.

## 2021-01-21 NOTE — ED Notes (Signed)
Pt speaks to nurse during safety rounds, pt is making movements that he stats are the same as "air benders", pt is referencing a cartoon. Pt also states that he is wearing a watch and setting it up, pt speaking to "siri" while holding his wrist up to his face, asking the time and weather.

## 2021-01-21 NOTE — ED Notes (Signed)
Hourly rounding performed, patient currently asleep in room. Patient has no complaints at this time. Q15 minute rounds and monitoring via Rover and Officer to continue. 

## 2021-01-21 NOTE — ED Notes (Signed)
IVC pending consult   
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
## 2021-01-21 NOTE — ED Notes (Signed)
Pt is again in restroom, closes door and flushes immediately

## 2021-01-21 NOTE — ED Notes (Signed)
Patient agreed to take medications.  Patient cooperative while medications given in left deltoid.

## 2021-01-21 NOTE — ED Notes (Signed)
Hourly rounding performed, patient currently awake in room. Patient has no complaints at this time. Q15 minute rounds and monitoring via Rover and Officer to continue. 

## 2021-01-21 NOTE — ED Notes (Signed)
Back to bed from bathroom. Refused to take PO medications. Requesting to speak with Dr. Toni Amend.

## 2021-01-21 NOTE — ED Notes (Signed)
To bathroom and back to room. Appears that pt had BM in toilet.

## 2021-01-21 NOTE — ED Notes (Addendum)
Hourly rounding performed, patient currently awake in restroom. Patient has no complaints at this time. Q15 minute rounds and monitoring via Psychologist, counselling to continue.

## 2021-01-21 NOTE — ED Notes (Signed)
IVC dr. Toni Amend finished consult  recommended inpatient
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
## 2021-01-21 NOTE — ED Notes (Signed)
Pt to restroom, this nurse speaks to pt before he enters and informs him that urine sample is needed. Pt states he is not giving urine sample and enters restroom.

## 2021-01-21 NOTE — ED Notes (Signed)
Patient's legal guardian, Crystal, updated that patient is here and is cooperative at this time and awaiting psych assessment.

## 2021-01-21 NOTE — ED Notes (Signed)
Pt speaking with Dr. Toni Amend at doorway.

## 2021-01-21 NOTE — ED Notes (Signed)
Report received from Ally, RN including SBAR. Patient alert and oriented, warm and dry, and in no acute distress. Patient denies SI, HI, AVH and pain. Patient made aware of Q15 minute rounds and Rover and Officer presence for their safety. Patient instructed to come to this nurse with needs or concerns.  

## 2021-01-21 NOTE — ED Notes (Addendum)
Pt awake and up to bathroom. Refused to allow staff to obtain VS. Pt did receive breakfast tray.
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
## 2021-01-21 NOTE — ED Notes (Addendum)
Pt refused afternoon vital signs.

## 2021-01-22 NOTE — ED Notes (Signed)
Hourly rounding performed, patient currently awake in room. Patient has no complaints at this time. Q15 minute rounds and monitoring via Rover and Officer to continue. 

## 2021-01-22 NOTE — ED Notes (Signed)
Pt pacing back and forth to his room and the bathroom.  Having conversations with himself and dance moves in the hallway.  Pt is pleasant at this time.  Pt was given 2 cups of apple juice.

## 2021-01-22 NOTE — ED Notes (Signed)
Hourly rounding performed, patient currently asleep in room. Patient has no complaints at this time. Q15 minute rounds and monitoring via Rover and Officer to continue. 

## 2021-01-22 NOTE — ED Notes (Signed)
Hourly rounding performed, patient currently asleep in room. Patient has no complaints at this time. Q15 minute rounds and monitoring via Security Cameras to continue. 

## 2021-01-22 NOTE — ED Notes (Addendum)
Pt given sandwich tray, apple juice, strawberry italian ice cream, body cream, and blue socks per request. Pt pleasant at this time.

## 2021-01-22 NOTE — ED Notes (Signed)
Pt continues to refuse VS

## 2021-01-22 NOTE — ED Notes (Signed)
Hourly rounding performed, patient currently awake in room. Patient has no complaints at this time. Q15 minute rounds and monitoring via Security Cameras to continue. 
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
## 2021-01-22 NOTE — ED Notes (Signed)
Report received from Dorothy, RN including SBAR. Patient alert and oriented, warm and dry, in no acute distress. Patient denies SI, HI, AVH and pain. Patient made aware of Q15 minute rounds and security cameras for their safety. Patient instructed to come to this nurse with needs or concerns. 

## 2021-01-22 NOTE — ED Notes (Signed)
Pt refusing vitals at this time. Will attempt later.

## 2021-01-22 NOTE — ED Notes (Addendum)
Per security pt in the shower since 1650; pt out of shower at this time.

## 2021-01-22 NOTE — ED Notes (Signed)
Pt denies need for snack, states he just finished snack he already had in room, requesting medications for sleep now

## 2021-01-22 NOTE — ED Notes (Signed)
Pt transferred to rm 8. Initially upset but staff spoke with him and he stayed calm. Pt requesting shower supplies which were given to him.

## 2021-01-22 NOTE — ED Notes (Signed)
Pt back to restroom at this time.

## 2021-01-22 NOTE — BH Assessment (Signed)
Writer confirmed with [facility] [phone]), patient is now on the wait list

## 2021-01-22 NOTE — ED Notes (Signed)
IVC  MOVED  TO  [facility]  ON  [facility]  WAITLIST

## 2021-01-22 NOTE — ED Notes (Signed)
Pt is pacing and quietly talking to self, is nonviolent and keeping to self at this time. Will continue to monitor.

## 2021-01-22 NOTE — ED Notes (Signed)
Pt requested meds for sleep at this time, spoke to Dr. Fanny Bien about meds that are due and he staets that these were sufficient for sleep. Will administer now

## 2021-01-22 NOTE — BH Assessment (Addendum)
Patient referred to [facility] Referral Form and supporting documentation completed and faxed to [facility]  Verbal screening completed with [facility]([phone]), information faxed and confirmed it was received as of 01/22/21 4:44am  Patient referral now under review with [facility] Nurse as of 01/22/21 4:44am

## 2021-01-22 NOTE — ED Provider Notes (Signed)
Emergency Medicine Observation Re-evaluation Note  Jackson [name] is a 36 y.o. male, seen on rounds today.  Pt initially presented to the ED for complaints of No chief complaint on file. Currently, the patient is calm and cooperative.  Physical Exam  BP (!) 143/97 (BP Location: Right Arm)   Pulse 81   Temp 97.8 F (36.6 C) (Oral)   Resp 20   SpO2 97%  Physical Exam General: No apparent distress HEENT: moist mucous membranes CV: RRR Pulm: Normal WOB GI: soft and non tender MSK: no edema or cyanosis Neuro: face symmetric, moving all extremities   ED Course / MDM  EKG:   I have reviewed the labs performed to date as well as medications administered while in observation.  No acute changes overnight or new labs this morning  Plan  Current plan is for placement.   Don Perking, [place], MD 01/22/21 [phone]

## 2021-01-22 NOTE — ED Notes (Signed)
IVC/pending placement to [facility]

## 2021-01-22 NOTE — ED Notes (Signed)
Pt refused medications and stated "they mess up my body."

## 2021-01-23 MED ORDER — DIPHENHYDRAMINE HCL 50 MG/ML IJ SOLN: 50.0000 mg | INTRAMUSCULAR | Status: DC | PRN

## 2021-01-23 MED ORDER — HALOPERIDOL LACTATE 5 MG/ML IJ SOLN: 10.0000 mg | INTRAMUSCULAR | Status: DC | PRN

## 2021-01-23 MED ORDER — LORAZEPAM 2 MG/ML IJ SOLN: 2.0000 mg | INTRAMUSCULAR | Status: DC | PRN

## 2021-01-23 NOTE — ED Notes (Signed)
Hourly rounding reveals patient in room. No complaints, stable, in no acute distress. Q15 minute rounds and monitoring via Security Cameras to continue. 

## 2021-01-23 NOTE — ED Notes (Signed)
VS assessed. Breakfast tray given.  

## 2021-01-23 NOTE — ED Notes (Signed)
Hourly rounding performed, patient currently asleep in room. Patient has no complaints at this time. Q15 minute rounds and monitoring via Security Cameras to continue. 

## 2021-01-23 NOTE — ED Notes (Signed)
Pt awake now, pt to restroom and out into hallway. Pt is now pacing, whistling and continues with his tics.

## 2021-01-23 NOTE — ED Notes (Signed)
Pt asleep at this time, unable to collect vitals. Will collect pt vitals once awake. 

## 2021-01-23 NOTE — ED Notes (Signed)
Report to include Situation, Background, Assessment, and Recommendations received from [facility]. Patient alert and oriented, warm and dry, in no acute distress. Patient denies SI, HI, AVH and pain. Patient appears to having AVH. Patient made aware of Q15 minute rounds and security cameras for their safety. Patient instructed to come to me with needs or concerns.

## 2021-01-23 NOTE — ED Notes (Signed)
Pt given dinner tray.

## 2021-01-23 NOTE — ED Provider Notes (Signed)
Emergency Medicine Observation Re-evaluation Note  Jackson [name] is a 35 y.o. male, seen on rounds today.  Pt initially presented to the ED for complaints of No chief complaint on file. Currently, the patient is standing at the door singing.  Physical Exam  BP 137/84 (BP Location: Left Arm)   Pulse (!) 106   Temp 97.8 F (36.6 C) (Oral)   Resp 18   SpO2 98%  Physical Exam Constitutional: Resting comfortably. Eyes: Conjunctivae are normal. Head: Atraumatic. Nose: No congestion/rhinnorhea. Mouth/Throat: Mucous membranes are moist. Neck: Normal ROM Cardiovascular: No cyanosis noted. Respiratory: Normal respiratory effort. Gastrointestinal: Non-distended. Genitourinary: deferred Musculoskeletal: No lower extremity tenderness nor edema. Neurologic:  Normal speech and language. No gross focal neurologic deficits are appreciated. Skin:  Skin is warm, dry and intact. No rash noted.   ED Course / MDM  EKG:   I have reviewed the labs performed to date as well as medications administered while in observation.  Recent changes in the last 24 hours include none.  Plan  Current plan is for psychiatric admission. Patient is under full IVC at this time.   Chesley Noon, MD 01/23/21 [phone]

## 2021-01-23 NOTE — ED Notes (Signed)
Pt back in restroom now.

## 2021-01-23 NOTE — ED Notes (Signed)
IVC  ON  [facility]  WAITLIST 

## 2021-01-23 NOTE — ED Notes (Signed)
Pt is sleeping. Delayed vitals until he is awake.

## 2021-01-23 NOTE — ED Notes (Signed)
Pt responding to internal stimuli, talking loudly to self and occassionally yelling in his room. Redirection ineffective.

## 2021-01-23 NOTE — ED Notes (Signed)
Pt refusing medications, attempted to give several times but pt keeps refusing stating "I don't need pills"

## 2021-01-24 NOTE — ED Notes (Signed)
Patient in bed sleeping will continue to monitor.

## 2021-01-24 NOTE — ED Notes (Signed)
Patient offered shower, patient declined.

## 2021-01-24 NOTE — BH Assessment (Signed)
Writer confirmed with [facility] ([facility]-[phone]), patient remains on their wait list 

## 2021-01-24 NOTE — ED Notes (Signed)
Hourly rounding reveals patient in room. No complaints, stable, in no acute distress. Q15 minute rounds and monitoring via Security Cameras to continue. 

## 2021-01-24 NOTE — ED Notes (Signed)
Patient up early at window making a whistling sound. Pacing from room to room jumping up and down and going in circles, then stops as if he is listening to something and begins to whistle. Safety maintained. Will continue to monitor.

## 2021-01-24 NOTE — ED Notes (Signed)
Patient up out of no hall, talking loud and holding his hands as if holding a weapon. Patient shares he is playing Call of [employer], apologetic for being loud with his guns. Dinner and drink provided. Patient not able to sit and eat, patient took his burger out of box and eating as he pace up and down the hall.

## 2021-01-24 NOTE — ED Notes (Signed)
Patient sleeping in bed, vital signs obtained and documented.

## 2021-01-24 NOTE — ED Provider Notes (Signed)
Emergency Medicine Observation Re-evaluation Note  Jackson [name] is a 35 y.o. male, seen on rounds today.  Pt initially presented to the ED for complaints of agitations  Currently, the patient is is no acute distress.   Physical Exam  Blood pressure 128/86, pulse 82, temperature 98.6 F (37 C), temperature source Tympanic, resp. rate 17, SpO2 (!) 84 %.  Physical Exam General: No apparent distress HEENT: moist mucous membranes CV: RRR Pulm: Normal WOB GI: soft and non tender MSK: no edema or cyanosis Neuro: face symmetric, moving all extremities     ED Course / MDM     I have reviewed the labs performed to date as well as medications administered while in observation.  Recent changes in the last 24 hours include low ox sat charted- will have nurses recheck this to confirm given no one altered me of this low oxygen level. Next doctor will follow up on this.   Plan   Current plan is to continue to wait for psych plan/placement if felt warranted  Patient is under full IVC at this time.   Concha Se, MD 01/24/21 1534

## 2021-01-24 NOTE — ED Notes (Signed)
VS not taken, patient asleep 
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
## 2021-01-24 NOTE — ED Notes (Signed)
Breakfast provided. Patient in hall talking loud, and requesting to know when is [facility]. Patient states he doesn't know why he is here reports people just came to his house and brought him here.

## 2021-01-24 NOTE — ED Notes (Signed)
Patient refused to take his scheduled meds.

## 2021-01-24 NOTE — BH Assessment (Signed)
Writer followed up with [facility] Vonna Kotyk [phone]), patient remains on their wait list.

## 2021-01-24 NOTE — ED Notes (Signed)
IVC/ On [facility] Waitlist 

## 2021-01-25 DIAGNOSIS — F250 Schizoaffective disorder, bipolar type: Secondary | ICD-10-CM | POA: Diagnosis not present

## 2021-01-25 NOTE — ED Notes (Signed)
Patient pacing in hall jumping up and down making a whistling sound. Calm and cooperative at present time. Will continue to monitor.

## 2021-01-25 NOTE — ED Notes (Signed)
Scheduled meds offered patient refused " stated I don't need that shit no Im not taking no medication".

## 2021-01-25 NOTE — ED Notes (Signed)
Patient has adamantly refused his scheduled HS meds. He states "I am not crazy".

## 2021-01-25 NOTE — ED Provider Notes (Signed)
Emergency Medicine Observation Re-evaluation Note  Jackson [name] is a 35 y.o. male, seen on rounds today.  Pt initially presented to the ED for complaints of No chief complaint on file. Currently, the patient is not complaining of any acute issues.  Physical Exam  BP (!) 141/92 (BP Location: Right Arm)   Pulse 84   Temp 98 F (36.7 C) (Oral)   Resp 18   SpO2 100%  Physical Exam General: no acute distress Cardiac: normal rate Lungs: equal chest rise Psych: calm  ED Course / MDM  EKG:   I have reviewed the labs performed to date as well as medications administered while in observation.  Recent changes in the last 24 hours include none.  Plan  Current plan is for inpatient. Patient is under full IVC at this time.   Gilles Chiquito, MD 01/25/21 [phone]
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
## 2021-01-25 NOTE — ED Notes (Signed)
Patient at nurses station requesting icecream. Patient very polite with thank you response. Patient has been calm and cooperative sleeping between meals. Awakens to name and politely responsive

## 2021-01-25 NOTE — ED Notes (Signed)
Patient up early, banging on window requesting information on when he will be out of here.

## 2021-01-25 NOTE — ED Notes (Signed)
INVOLUNTARY and continues to await placement 

## 2021-01-26 DIAGNOSIS — F250 Schizoaffective disorder, bipolar type: Secondary | ICD-10-CM | POA: Diagnosis not present

## 2021-01-26 NOTE — ED Notes (Signed)
Dinner tray provided

## 2021-01-26 NOTE — ED Provider Notes (Signed)
Emergency Medicine Observation Re-evaluation Note  Jackson [name] is a 35 y.o. male, seen on rounds today.  Pt initially presented to the ED for complaints of No chief complaint on file. Currently, the patient is resting comfortably.  Physical Exam  BP (!) 141/92 (BP Location: Right Arm)   Pulse 84   Temp 98 F (36.7 C) (Oral)   Resp 18   SpO2 100%  Physical Exam Constitutional:      Appearance: He is not ill-appearing or toxic-appearing.  HENT:     Head: Atraumatic.  Cardiovascular:     Comments: Well perfused Pulmonary:     Effort: Pulmonary effort is normal.  Abdominal:     General: There is no distension.  Musculoskeletal:        General: No deformity.  Skin:    Findings: No rash.  Neurological:     General: No focal deficit present.     Cranial Nerves: No cranial nerve deficit.    ED Course / MDM  EKG:   I have reviewed the labs performed to date as well as medications administered while in observation.  Recent changes in the last 24 hours include none.  Plan  Current plan is for psychiatric placement. Patient is under full IVC at this time.   Delton Prairie, MD 01/26/21 [phone]

## 2021-01-26 NOTE — ED Notes (Signed)
Pt continues to refuse VS and medications.  Pt pacing in hallway, occasionally interacting loudly with internal stimuli, mostly calm,although having conversations with internal stimuli.  No aggression toward staff noted this shift.

## 2021-01-26 NOTE — ED Notes (Signed)
Breakfast tray provided. 
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
## 2021-01-26 NOTE — ED Notes (Signed)
Report received, care of pt assumed.  Pt alternating pacing in hallway ans sitting in chair in hallway.  Pt refuses VS and all other medical care at this time.  Will monitor.

## 2021-01-26 NOTE — ED Notes (Signed)
Patient refused for ED tech to obtain VS.

## 2021-01-26 NOTE — ED Notes (Signed)
Pt continues to refuse all medications, VS and other medical treatment.  Pt continues to pace in hallway and interact with internal stimuli.

## 2021-01-26 NOTE — ED Notes (Signed)
IVC pending placement [facility] informed TTS to inform MD or NP IVC papers expire 6/26

## 2021-01-26 NOTE — ED Notes (Signed)
Lunch tray provided. 

## 2021-01-27 DIAGNOSIS — F250 Schizoaffective disorder, bipolar type: Secondary | ICD-10-CM | POA: Diagnosis not present

## 2021-01-27 NOTE — ED Notes (Signed)
Pt continues to refuse VS,

## 2021-01-27 NOTE — ED Notes (Signed)
Report to include Situation, Background, Assessment, and Recommendations received from Amy RN. Patient alert and oriented, warm and dry, in no acute distress. Patient denies SI, HI and pain. Patient appears to having AVH. Patient made aware of Q15 minute rounds and security cameras for their safety. Patient instructed to come to me with needs or concerns.

## 2021-01-27 NOTE — ED Notes (Signed)
Hourly rounding reveals patient in room. No complaints, stable, in no acute distress. Q15 minute rounds and monitoring via Security Cameras to continue. 

## 2021-01-27 NOTE — ED Notes (Signed)
Pt refused vitals again at this time

## 2021-01-27 NOTE — ED Provider Notes (Signed)
Emergency Medicine Observation Re-evaluation Note  Dai Sutter is a 35 y.o. male, seen on rounds today.    Patient is resting in his bed.  No distress.  Physical Exam  BP 131/86   Pulse 90   Temp 98.2 F (36.8 C) (Oral)   Resp 18   SpO2 97%  Physical Exam General: no acute distress, resting, pulls up sheets, no distress Cardiac: normal rate Lungs: equal chest rise Psych: calm  ED Course / MDM  EKG:   I have reviewed the labs performed to date as well as medications administered while in observation.  Recent changes in the last 24 hours include none.  Plan  Current plan is for inpatient. Appears trying to find bed at [facility]  IVC renewed by me  Patient is under full IVC at this time.      Sharyn Creamer, MD 01/27/21 [phone]
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
## 2021-01-27 NOTE — ED Notes (Signed)
Pt is now knocking on window now

## 2021-01-27 NOTE — ED Notes (Signed)
Pt yelling "HELLO" from his bed.

## 2021-01-27 NOTE — ED Notes (Signed)
Snack and beverage given. 

## 2021-01-27 NOTE — ED Notes (Signed)
Pt refused to let writer get vital, I will ask again later one. Pt did ask to take a shower RN notified  Of his request.

## 2021-01-27 NOTE — BH Assessment (Signed)
Writer followed up with [facility] (Ms. [phone]), patient remains on their wait list.

## 2021-01-28 NOTE — ED Notes (Signed)
Hourly rounding reveals patient in room. No complaints, stable, in no acute distress. Q15 minute rounds and monitoring via Security Cameras to continue. 

## 2021-01-28 NOTE — ED Notes (Signed)
Pt calm, sitting in chair. Took morning medications. No complaints. No acute issues.

## 2021-01-28 NOTE — ED Notes (Signed)
IVC/pending placement to [facility] 

## 2021-01-28 NOTE — ED Provider Notes (Signed)
Emergency Medicine Observation Re-evaluation Note  Jackson [name] is a 35 y.o. male, seen on rounds today.  Pt initially presented to the ED for complaints of No chief complaint on file.  Currently, the patient is calm, no acute complaints.  Physical Exam  Blood pressure 133/83, pulse (!) 115, temperature 98.3 F (36.8 C), temperature source Oral, resp. rate 18, SpO2 97 %. Physical Exam General: NAD Lungs: CTAB Psych: not agitated  ED Course / MDM  EKG:    I have reviewed the labs performed to date as well as medications administered while in observation.  Recent changes in the last 24 hours include no acute events overnight.    Plan  Current plan is for inpatient psychiatric treatment. Patient is under full IVC at this time.   Sharman Cheek, MD 01/28/21 [phone]

## 2021-01-28 NOTE — ED Notes (Signed)
IVC pending placement 

## 2021-01-28 NOTE — ED Notes (Signed)
Pt calm sitting in chair, no complaints at this time. Will continue to monitor.

## 2021-01-28 NOTE — ED Notes (Signed)
VS not taken, patient asleep 

## 2021-01-28 NOTE — ED Notes (Signed)
Pt still pacing and is very animated. Given breakfast tray. No issues.

## 2021-01-28 NOTE — ED Notes (Signed)
Pt still playing guitar and has added singing to the mix. Pt calm, stated that he was ok and did not need anything other than juice.   Pt given 2 cups juice.

## 2021-01-28 NOTE — ED Notes (Signed)
Pt sitting in chair.

## 2021-01-28 NOTE — ED Notes (Signed)
Pt awake, pacing in hall, playing air guitar, jumping up and down. No acute issues at this time.

## 2021-01-28 NOTE — Consult Note (Signed)
[facility] Face-to-Face Psychiatry Consult   Reason for Consult: Follow-up for this 35 year old man with schizoaffective or bipolar disorder in the emergency room for agitated behavior with mania Referring Physician: Scotty Court Patient Identification: Jackson [name] MRN:  [id number] Principal Diagnosis: Schizoaffective disorder, bipolar type (HCC) Diagnosis:  Principal Problem:   Schizoaffective disorder, bipolar type (HCC)   Total Time spent with patient: 30 minutes  Subjective:   Jackson [name] is a 35 y.o. male patient admitted with "my mom called them.  I have no reason to be here".  HPI: Patient seen chart reviewed.  Patient continues to be hyperactive hyperverbal singing shouting moving around almost constantly.  Sleep is not very good although he claims that he is sleeping well at night.  Patient continues to be hyperverbal and intrusive and paranoid.  Did not threaten me directly this morning and also it appears has been taking his oral medication with some greater consistency  Past Psychiatric History: Past history of bipolar disorder or schizoaffective disorder with recurrent hospitalizations related to his poor insight and noncompliance  Risk to Self:   Risk to Others:   Prior Inpatient Therapy:   Prior Outpatient Therapy:    Past Medical History:  Past Medical History:  Diagnosis Date   Bipolar 1 disorder (HCC)    Schizoaffective disorder (HCC)    No past surgical history on file. Family History: No family history on file. Family Psychiatric  History: None reported Social History:  Social History   Substance and Sexual Activity  Alcohol Use Not Currently     Social History   Substance and Sexual Activity  Drug Use Not Currently    Social History   Socioeconomic History   Marital status: Single    Spouse name: Not on file   Number of children: Not on file   Years of education: Not on file   Highest education level: Not on file  Occupational History   Not on  file  Tobacco Use   Smoking status: Light Smoker    Packs/day: 0.25    Pack years: 0.00    Types: Cigarettes   Smokeless tobacco: Never  Vaping Use   Vaping Use: Never used  Substance and Sexual Activity   Alcohol use: Not Currently   Drug use: Not Currently   Sexual activity: Not on file  Other Topics Concern   Not on file  Social History Narrative   Not on file   Social Determinants of Health   Financial Resource Strain: Not on file  Food Insecurity: Not on file  Transportation Needs: Not on file  Physical Activity: Not on file  Stress: Not on file  Social Connections: Not on file   Additional Social History:    Allergies:  No Known Allergies  Labs: No results found for this or any previous visit (from the past 48 hour(s)).  Current Facility-Administered Medications  Medication Dose Route Frequency Provider Last Rate Last Admin   benztropine (COGENTIN) tablet 1 mg  1 mg Oral BID Emillio Ngo T, MD   1 mg at 01/28/21 6440   chlorproMAZINE (THORAZINE) tablet 250 mg  250 mg Oral Once [name], MD       diphenhydrAMINE (BENADRYL) injection 50 mg  50 mg Intramuscular Q4H PRN Jheremy Boger T, MD       divalproex (DEPAKOTE) DR tablet 1,000 mg  1,000 mg Oral Once [name], MD       divalproex (DEPAKOTE) DR tablet 1,000 mg  1,000 mg Oral Q12H Camillia Marcy T,  MD   1,000 mg at 01/28/21 7510   haloperidol lactate (HALDOL) injection 10 mg  10 mg Intramuscular Q4H PRN Maryann Mccall, [name], MD       LORazepam (ATIVAN) injection 2 mg  2 mg Intramuscular Q4H PRN Granvel Proudfoot, [name], MD       OLANZapine (ZYPREXA) tablet 15 mg  15 mg Oral BID Cordarrel Stiefel, [name], MD   15 mg at 01/28/21 0928   OLANZapine zydis (ZYPREXA) disintegrating tablet 30 mg  30 mg Oral Once [name], MD       ziprasidone (GEODON) injection 20 mg  20 mg Intramuscular Once [name], MD       Current Outpatient Medications  Medication Sig Dispense Refill   ARIPiprazole (ABILIFY) 15 MG tablet Take 1  tablet (15 mg total) by mouth daily for 8 days. 8 tablet 0   ARIPiprazole ER (ABILIFY MAINTENA) 400 MG SRER injection Inject 2 mLs (400 mg total) into the muscle every 28 (twenty-eight) days. (Patient not taking: No sig reported) 1 each    chlorproMAZINE (THORAZINE) 50 MG tablet Take 5 tablets (250 mg total) by mouth 3 (three) times daily. (Patient not taking: No sig reported) 90 tablet 0   divalproex (DEPAKOTE) 500 MG DR tablet Take 2 tablets (1,000 mg total) by mouth every 12 (twelve) hours. (Patient not taking: No sig reported) 120 tablet 1   OLANZapine zydis (ZYPREXA) 15 MG disintegrating tablet Take 2 tablets (30 mg total) by mouth at bedtime. (Patient not taking: No sig reported) 60 tablet 0    Musculoskeletal: Strength & Muscle Tone: within normal limits Gait & Station: normal Patient leans: N/A            Psychiatric Specialty Exam:  Presentation  General Appearance: Disheveled  Eye Contact:Fleeting  Speech:Normal Rate  Speech Volume:Increased  Handedness:Right   Mood and Affect  Mood:Angry; Irritable  Affect:Congruent   Thought Process  Thought Processes:Disorganized  Descriptions of Associations:Loose  Orientation:Full (Time, Place and Person)  Thought Content:Delusions; Paranoid Ideation; Tangential  History of Schizophrenia/Schizoaffective disorder:Yes  Duration of Psychotic Symptoms:Greater than six months  Hallucinations:No data recorded Ideas of Reference:Delusions  Suicidal Thoughts:No data recorded Homicidal Thoughts:No data recorded  Sensorium  Memory:Immediate Poor; Recent Poor; Remote Poor  Judgment:Impaired  Insight:Lacking   Executive Functions  Concentration:Poor  Attention Span:Fair  Recall:Poor  Fund of Knowledge:Fair  Language:Fair   Psychomotor Activity  Psychomotor Activity: No data recorded  Assets  Assets:Financial Resources/Insurance; Resilience; Social Support   Sleep  Sleep: No data  recorded  Physical Exam: Physical Exam Vitals and nursing note reviewed.  Constitutional:      Appearance: Normal appearance.  HENT:     Head: Normocephalic and atraumatic.     Mouth/Throat:     Pharynx: Oropharynx is clear.  Eyes:     Pupils: Pupils are equal, round, and reactive to light.  Cardiovascular:     Rate and Rhythm: Normal rate and regular rhythm.  Pulmonary:     Effort: Pulmonary effort is normal.     Breath sounds: Normal breath sounds.  Abdominal:     General: Abdomen is flat.     Palpations: Abdomen is soft.  Musculoskeletal:        General: Normal range of motion.  Skin:    General: Skin is warm and dry.  Neurological:     General: No focal deficit present.     Mental Status: He is alert. Mental status is at baseline.  Psychiatric:  Attention and Perception: He is inattentive.        Mood and Affect: Mood normal. Affect is labile, angry and inappropriate.        Speech: Speech is rapid and pressured and tangential.        Behavior: Behavior is agitated.        Thought Content: Thought content is paranoid.        Cognition and Memory: Cognition is impaired.        Judgment: Judgment is impulsive.   Review of Systems  Constitutional: Negative.   HENT: Negative.    Eyes: Negative.   Respiratory: Negative.    Cardiovascular: Negative.   Gastrointestinal: Negative.   Musculoskeletal: Negative.   Skin: Negative.   Neurological: Negative.   Psychiatric/Behavioral: Negative.    Blood pressure 133/83, pulse (!) 115, temperature 98.3 F (36.8 C), temperature source Oral, resp. rate 18, SpO2 97 %. There is no height or weight on file to calculate BMI.  Treatment Plan Summary: Plan patient is on mood stabilizers antipsychotics and has had long-acting Abilify.  Starting to be more compliant with oral medicine which is a good sign.  Continues to display manic behaviors that it are particularly in his case not consistent with being appropriate to admission  to the inpatient ward here.  He has been referred back to [facility].  No change to medicine orders for today.  If he stays compliant we may recheck a lab level soon  Disposition: Recommend psychiatric Inpatient admission when medically cleared.  Mordecai Rasmussen, MD 01/28/2021 11:27 AM

## 2021-01-28 NOTE — ED Notes (Signed)
Pt asked for and given lunch box.

## 2021-01-29 NOTE — ED Notes (Signed)
Pt asking for "a rubber pen and paper." Pt was given crayons and paper. Pt informed that if he draws on the wall, we would take the crayons away from him. Pt agreed not to draw on the walls.

## 2021-01-29 NOTE — ED Notes (Signed)
Hourly rounding performed, patient currently asleep in room. Patient has no complaints at this time. Q15 minute rounds and monitoring via Security Cameras to continue. 

## 2021-01-29 NOTE — ED Notes (Signed)
Pt. Given lunch tray.

## 2021-01-29 NOTE — ED Notes (Signed)
Report received from Annie, RN including SBAR. Patient alert and oriented, warm and dry, in no acute distress. Patient denies SI, HI, AVH and pain. Patient made aware of Q15 minute rounds and security cameras for their safety. Patient instructed to come to this nurse with needs or concerns. 
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
## 2021-01-29 NOTE — ED Notes (Signed)
Hourly rounding performed, patient currently awake in room. Patient has no complaints at this time. Q15 minute rounds and monitoring via Security Cameras to continue. 

## 2021-01-29 NOTE — ED Notes (Signed)
Patient refusing vitals at this time. Pt in NAD. Respirations even and unlabored.

## 2021-01-29 NOTE — ED Notes (Signed)
Pt continues to act like he has the ability to summon various objects with his mind, he believes he can time travel, control time, transport his body to other places on the earth. Pt whistles, claims that he can use thors hammer, the arrow from a movie, and created the matrix.

## 2021-01-29 NOTE — ED Provider Notes (Signed)
Emergency Medicine Observation Re-evaluation Note  Jackson [name] is a 35 y.o. male, seen on rounds today.  Pt initially presented to the ED for complaints of No chief complaint on file. Currently, the patient is resting comfortably.  Physical Exam  BP 107/77 (BP Location: Left Arm)   Pulse 85   Temp 97.8 F (36.6 C) (Oral)   Resp 18   SpO2 97%  Physical Exam General: No acute distress Cardiac: Well-perfused extremities Lungs: No respiratory distress Psych: Appropriate mood and affect  ED Course / MDM  EKG:   I have reviewed the labs performed to date as well as medications administered while in observation.  Recent changes in the last 24 hours include none.  Plan  Current plan is for psychiatric placement. Patient is under full IVC at this time.   Jackson Katos, MD 01/29/21 [phone]

## 2021-01-29 NOTE — ED Notes (Signed)
Pt given meal tray.

## 2021-01-29 NOTE — BH Assessment (Signed)
Writer followed up with [facility] (Ms. [phone]), patient remains on their wait list.

## 2021-01-29 NOTE — ED Notes (Signed)
Pt provided with snack and beverage of choice at this time

## 2021-01-29 NOTE — ED Notes (Signed)
Pt knocking on window, requesting sandwich tray. Pt given sandwich tray and apple juice at this time.

## 2021-01-30 DIAGNOSIS — F250 Schizoaffective disorder, bipolar type: Secondary | ICD-10-CM | POA: Diagnosis not present

## 2021-01-30 NOTE — ED Notes (Signed)
Pt asleep at this time, unable to collect vitals. Will collect pt vitals once awake. 

## 2021-01-30 NOTE — ED Notes (Signed)
Resumed care from ally rn.  Pt lying in bed in his room.

## 2021-01-30 NOTE — ED Notes (Signed)
Snack and beverage given. 

## 2021-01-30 NOTE — ED Notes (Signed)
Report to include Situation, Background, Assessment, and Recommendations received from Amy RN. Patient alert and oriented, warm and dry, in no acute distress. Patient denies SI, HI and pain . He appears to having  AVH. Patient made aware of Q15 minute rounds and security cameras for their safety. Patient instructed to come to me with needs or concerns.

## 2021-01-30 NOTE — BH Assessment (Signed)
Writer followed up with [facility] ([phone]), patient remains on their wait list.

## 2021-01-30 NOTE — ED Notes (Signed)
Hourly rounding performed, patient currently asleep in room. Patient has no complaints at this time. Q15 minute rounds and monitoring via Security Cameras to continue. 

## 2021-01-30 NOTE — ED Notes (Signed)
Pt's rooms are a mess with trash on floor, urinal with urine, scrubs and towels on floor.  Pt picked up some trash and scrubs off the floor.   EVS here to clean rooms.  Pt cooperative.

## 2021-01-30 NOTE — ED Notes (Signed)
Back into bed from shower.

## 2021-01-30 NOTE — ED Notes (Signed)
IVC/ Pending [facility] Waitlist

## 2021-01-30 NOTE — Consult Note (Signed)
[facility] Face-to-Face Psychiatry Consult   Reason for Consult: Follow-up for this patient with schizoaffective disorder in the emergency room Referring Physician: Scotty Court Patient Identification: Jackson [name] MRN:  [id number] Principal Diagnosis: Schizoaffective disorder, bipolar type (HCC) Diagnosis:  Principal Problem:   Schizoaffective disorder, bipolar type (HCC)   Total Time spent with patient: 30 minutes  Subjective:   Jackson [name] is a 35 y.o. male patient admitted with "there was never anything wrong with me".  HPI: Follow-up with this patient with schizoaffective disorder.  When he first came into the emergency room he was very agitated clearly manic behaving in ways that were potentially dangerous to others and to himself.  Over the last several days he has been much more compliant with oral medicine.  Taking Depakote regularly.  As expected mood has improved.  He has become less agitated.  Still has part of the day in which he is singing but has not been nearly as destructive.  During interview he is able to sit still and not snap his fingers constantly.  Still has poor insight and denies there was any reason to come into the hospital.  Past Psychiatric History: History of schizoaffective disorder several prior hospitalizations [facility]  Risk to Self:   Risk to Others:   Prior Inpatient Therapy:   Prior Outpatient Therapy:    Past Medical History:  Past Medical History:  Diagnosis Date   Bipolar 1 disorder (HCC)    Schizoaffective disorder (HCC)    No past surgical history on file. Family History: No family history on file. Family Psychiatric  History: See previous Social History:  Social History   Substance and Sexual Activity  Alcohol Use Not Currently     Social History   Substance and Sexual Activity  Drug Use Not Currently    Social History   Socioeconomic History   Marital status: Single    Spouse name: Not on file   Number of  children: Not on file   Years of education: Not on file   Highest education level: Not on file  Occupational History   Not on file  Tobacco Use   Smoking status: Light Smoker    Packs/day: 0.25    Pack years: 0.00    Types: Cigarettes   Smokeless tobacco: Never  Vaping Use   Vaping Use: Never used  Substance and Sexual Activity   Alcohol use: Not Currently   Drug use: Not Currently   Sexual activity: Not on file  Other Topics Concern   Not on file  Social History Narrative   Not on file   Social Determinants of Health   Financial Resource Strain: Not on file  Food Insecurity: Not on file  Transportation Needs: Not on file  Physical Activity: Not on file  Stress: Not on file  Social Connections: Not on file   Additional Social History:    Allergies:  No Known Allergies  Labs: No results found for this or any previous visit (from the past 48 hour(s)).  Current Facility-Administered Medications  Medication Dose Route Frequency Provider Last Rate Last Admin   benztropine (COGENTIN) tablet 1 mg  1 mg Oral BID Casmir Auguste T, MD   1 mg at 01/30/21 1052   chlorproMAZINE (THORAZINE) tablet 250 mg  250 mg Oral Once [name], MD       diphenhydrAMINE (BENADRYL) injection 50 mg  50 mg Intramuscular Q4H PRN Mayela Bullard, [name], MD       divalproex (DEPAKOTE) DR tablet  1,000 mg  1,000 mg Oral Once [name], MD       divalproex (DEPAKOTE) DR tablet 1,000 mg  1,000 mg Oral Q12H Mosetta Ferdinand, [name], MD   1,000 mg at 01/30/21 1052   haloperidol lactate (HALDOL) injection 10 mg  10 mg Intramuscular Q4H PRN Jeryl Umholtz, [name], MD       LORazepam (ATIVAN) injection 2 mg  2 mg Intramuscular Q4H PRN Mariangela Heldt, [name], MD       OLANZapine (ZYPREXA) tablet 15 mg  15 mg Oral BID Monasia Lair, [name], MD   15 mg at 01/30/21 1051   OLANZapine zydis (ZYPREXA) disintegrating tablet 30 mg  30 mg Oral Once [name], MD       ziprasidone (GEODON) injection 20 mg  20 mg Intramuscular Once [name], MD       Current Outpatient Medications  Medication Sig Dispense Refill   ARIPiprazole (ABILIFY) 15 MG tablet Take 1 tablet (15 mg total) by mouth daily for 8 days. 8 tablet 0   ARIPiprazole ER (ABILIFY MAINTENA) 400 MG SRER injection Inject 2 mLs (400 mg total) into the muscle every 28 (twenty-eight) days. (Patient not taking: No sig reported) 1 each    chlorproMAZINE (THORAZINE) 50 MG tablet Take 5 tablets (250 mg total) by mouth 3 (three) times daily. (Patient not taking: No sig reported) 90 tablet 0   divalproex (DEPAKOTE) 500 MG DR tablet Take 2 tablets (1,000 mg total) by mouth every 12 (twelve) hours. (Patient not taking: No sig reported) 120 tablet 1   OLANZapine zydis (ZYPREXA) 15 MG disintegrating tablet Take 2 tablets (30 mg total) by mouth at bedtime. (Patient not taking: No sig reported) 60 tablet 0    Musculoskeletal: Strength & Muscle Tone: within normal limits Gait & Station: normal Patient leans: N/A            Psychiatric Specialty Exam:  Presentation  General Appearance: Disheveled  Eye Contact:Fleeting  Speech:Normal Rate  Speech Volume:Increased  Handedness:Right   Mood and Affect  Mood:Angry; Irritable  Affect:Congruent   Thought Process  Thought Processes:Disorganized  Descriptions of Associations:Loose  Orientation:Full (Time, Place and Person)  Thought Content:Delusions; Paranoid Ideation; Tangential  History of Schizophrenia/Schizoaffective disorder:Yes  Duration of Psychotic Symptoms:Greater than six months  Hallucinations:No data recorded Ideas of Reference:Delusions  Suicidal Thoughts:No data recorded Homicidal Thoughts:No data recorded  Sensorium  Memory:Immediate Poor; Recent Poor; Remote Poor  Judgment:Impaired  Insight:Lacking   Executive Functions  Concentration:Poor  Attention Span:Fair  Recall:Poor  Fund of Knowledge:Fair  Language:Fair   Psychomotor Activity  Psychomotor Activity: No  data recorded  Assets  Assets:Financial Resources/Insurance; Resilience; Social Support   Sleep  Sleep: No data recorded  Physical Exam: Physical Exam Vitals and nursing note reviewed.  Constitutional:      Appearance: Normal appearance.  HENT:     Head: Normocephalic and atraumatic.     Mouth/Throat:     Pharynx: Oropharynx is clear.  Eyes:     Pupils: Pupils are equal, round, and reactive to light.  Cardiovascular:     Rate and Rhythm: Normal rate and regular rhythm.  Pulmonary:     Effort: Pulmonary effort is normal.     Breath sounds: Normal breath sounds.  Abdominal:     General: Abdomen is flat.     Palpations: Abdomen is soft.  Musculoskeletal:        General: Normal range of motion.  Skin:    General: Skin is warm and dry.  Neurological:  General: No focal deficit present.     Mental Status: He is alert. Mental status is at baseline.  Psychiatric:        Attention and Perception: Attention normal.        Mood and Affect: Mood is elated.        Speech: Speech normal.        Behavior: Behavior is agitated. Behavior is not aggressive or hyperactive.        Thought Content: Thought content normal.        Cognition and Memory: Memory is impaired.        Judgment: Judgment is impulsive.   Review of Systems  Constitutional: Negative.   HENT: Negative.    Eyes: Negative.   Respiratory: Negative.    Cardiovascular: Negative.   Gastrointestinal: Negative.   Musculoskeletal: Negative.   Skin: Negative.   Neurological: Negative.   Psychiatric/Behavioral: Negative.    Blood pressure (!) 138/102, pulse (!) 102, temperature 98.5 F (36.9 C), temperature source Oral, resp. rate 16, SpO2 100 %. There is no height or weight on file to calculate BMI.  Treatment Plan Summary: Plan no change to current medication orders.  Encourage patient to take his medicine and thanked him for his compliance.  Unclear when we will be able to make a decision.  Starts to seem more  unlikely that we are going to get him back to [facility] we may have to consider discharge but only after consultation with his ACT team and his guardian  Disposition: No evidence of imminent risk to self or others at present.   Supportive therapy provided about ongoing stressors.  Mordecai Rasmussen, MD 01/30/2021 6:17 PM

## 2021-01-30 NOTE — ED Notes (Signed)
Given breakfast tray. 

## 2021-01-30 NOTE — ED Notes (Signed)
Given lunch

## 2021-01-30 NOTE — ED Provider Notes (Signed)
Emergency Medicine Observation Re-evaluation Note  Griselda Dysert is a 35 y.o. male, currently in the emergency department for psychiatric condition and agitation.  Physical Exam  BP (!) 138/102 (BP Location: Right Arm)   Pulse (!) 102   Temp 98.5 F (36.9 C) (Oral)   Resp 16   SpO2 100%  No acute events over the past 24 hours.  ED Course / MDM   No new lab work over the past 24 hours.  Recent lab work reviewed and is largely nonrevealing.  COVID-negative.  Plan  Current plan is for placement into [facility] once a bed becomes available. Patient is under full IVC at this time.   Minna Antis, MD 01/30/21 1539

## 2021-01-30 NOTE — ED Notes (Signed)
Pt requesting shower, given shower supplies and new clothing. Pt currently in shower.

## 2021-01-31 NOTE — ED Notes (Signed)
Hourly rounding reveals patient in room. No complaints, stable, in no acute distress. Q15 minute rounds and monitoring via Security Cameras to continue. 

## 2021-01-31 NOTE — ED Notes (Signed)
Pt refused vital signs, took medications with no issues

## 2021-01-31 NOTE — ED Notes (Signed)
IVC pending placement 

## 2021-01-31 NOTE — ED Notes (Signed)
VS not taken, patient asleep 

## 2021-01-31 NOTE — ED Provider Notes (Signed)
Emergency Medicine Observation Re-evaluation Note  Darriel Hindes is a 35 y.o. male, seen on rounds today.  Pt initially presented to the ED for complaints of No chief complaint on file. Currently, the patient is resting comfortably.  Physical Exam  BP 123/71 (BP Location: Right Arm)   Pulse 77   Temp 98.3 F (36.8 C) (Oral)   Resp 18   SpO2 100%  Physical Exam General: No acute distress Cardiac: Well-perfused extremities Lungs: No respiratory distress Psych: Appropriate mood and affect  ED Course / MDM  EKG:   I have reviewed the labs performed to date as well as medications administered while in observation.  Recent changes in the last 24 hours include none.  Plan  Current plan is for psychiatric placement. Patient is under full IVC at this time.   Merwyn Katos, MD 01/31/21 1340

## 2021-01-31 NOTE — BH Assessment (Signed)
Patient remains on [employer].

## 2021-01-31 NOTE — ED Notes (Signed)
Report to include Situation, Background, Assessment, and Recommendations received from Ariel RN. Patient alert and oriented, warm and dry, in no acute distress. Patient denies SI, HI, AVH and pain. Patient made aware of Q15 minute rounds and security cameras for their safety. Patient instructed to come to me with needs or concerns.  

## 2021-01-31 NOTE — ED Notes (Signed)
Pt. Given meal tray an drink. No other concerns at this time 

## 2021-01-31 NOTE — ED Notes (Signed)
Pt refused vital signs at this time. Pt stated that he does not want them taken tonight. RN notified.

## 2021-02-01 DIAGNOSIS — F250 Schizoaffective disorder, bipolar type: Secondary | ICD-10-CM | POA: Diagnosis not present

## 2021-02-01 NOTE — BH Assessment (Signed)
Writer followed up with [facility] ([name] [phone]), patient remains on their wait list. 

## 2021-02-01 NOTE — ED Notes (Signed)
Hourly rounding reveals patient in room. No complaints, stable, in no acute distress. Q15 minute rounds and monitoring via Security Cameras to continue. 

## 2021-02-01 NOTE — ED Provider Notes (Signed)
Emergency Medicine Observation Re-evaluation Note  Jackson [name] is a 35 y.o. male, seen on rounds today.  Pt initially presented to the ED for complaints of No chief complaint on file. Currently, the patient is laying in bed, requesting to leave.  Physical Exam  BP 124/76 (BP Location: Right Arm)   Pulse 76   Temp 98.6 F (37 C) (Oral)   Resp 17   SpO2 98%  Physical Exam Constitutional: Resting comfortably. Eyes: Conjunctivae are normal. Head: Atraumatic. Nose: No congestion/rhinnorhea. Mouth/Throat: Mucous membranes are moist. Neck: Normal ROM Cardiovascular: No cyanosis noted. Respiratory: Normal respiratory effort. Gastrointestinal: Non-distended. Genitourinary: deferred Musculoskeletal: No lower extremity tenderness nor edema. Neurologic:  Normal speech and language. No gross focal neurologic deficits are appreciated. Skin:  Skin is warm, dry and intact. No rash noted.   ED Course / MDM  EKG:   I have reviewed the labs performed to date as well as medications administered while in observation.  Recent changes in the last 24 hours include none.  Plan  Current plan is for psychiatric admission. Patient is under full IVC at this time.   Chesley Noon, MD 02/01/21 [phone]

## 2021-02-01 NOTE — BH Assessment (Signed)
Writer followed up with [facility] ([phone]), patient remains on their wait list

## 2021-02-01 NOTE — ED Notes (Signed)
IVC, pending placement 

## 2021-02-01 NOTE — Consult Note (Signed)
[facility] Face-to-Face Psychiatry Consult   Reason for Consult: Follow-up for this patient with schizoaffective disorder in the emergency room Referring Physician: Scotty Court Patient Identification: Jackson [name] MRN:  [id number] Principal Diagnosis: Schizoaffective disorder, bipolar type (HCC) Diagnosis:  Principal Problem:   Schizoaffective disorder, bipolar type (HCC)   Total Time spent with patient: 15 minutes  Subjective:   Jackson [name] is a 35 y.o. male patient stated, "I'm fine".  Today, he continues to take his medications.  He moved his mattress into the bathroom and sleeping there as it is warmer.  Jackson [name] is hanging out in the bathroom during the day, coloring at times.  Then, he will jump out into the hallway and jump around before returning to the bathroom.  Alternates between coloring, sleeping, and eating during the day.  Per Dr Toni Amend on 6/30: HPI: Follow-up with this patient with schizoaffective disorder.  When he first came into the emergency room he was very agitated clearly manic behaving in ways that were potentially dangerous to others and to himself.  Over the last several days he has been much more compliant with oral medicine.  Taking Depakote regularly.  As expected mood has improved.  He has become less agitated.  Still has part of the day in which he is singing but has not been nearly as destructive.  During interview he is able to sit still and not snap his fingers constantly.  Still has poor insight and denies there was any reason to come into the hospital.  Past Psychiatric History: History of schizoaffective disorder several prior hospitalizations including [facility] hospital [facility]  Risk to Self:  none Risk to Others:  none Prior Inpatient Therapy:  several Prior Outpatient Therapy:  ACT team  Past Medical History:  Past Medical History:  Diagnosis Date   Bipolar 1 disorder (HCC)    Schizoaffective disorder (HCC)    No past surgical history on file. Family  History: No family history on file. Family Psychiatric  History: See previous Social History:  Social History   Substance and Sexual Activity  Alcohol Use Not Currently     Social History   Substance and Sexual Activity  Drug Use Not Currently    Social History   Socioeconomic History   Marital status: Single    Spouse name: Not on file   Number of children: Not on file   Years of education: Not on file   Highest education level: Not on file  Occupational History   Not on file  Tobacco Use   Smoking status: Light Smoker    Packs/day: 0.25    Pack years: 0.00    Types: Cigarettes   Smokeless tobacco: Never  Vaping Use   Vaping Use: Never used  Substance and Sexual Activity   Alcohol use: Not Currently   Drug use: Not Currently   Sexual activity: Not on file  Other Topics Concern   Not on file  Social History Narrative   Not on file   Social Determinants of Health   Financial Resource Strain: Not on file  Food Insecurity: Not on file  Transportation Needs: Not on file  Physical Activity: Not on file  Stress: Not on file  Social Connections: Not on file   Additional Social History:    Allergies:  No Known Allergies  Labs: No results found for this or any previous visit (from the past 48 hour(s)).  Current Facility-Administered Medications  Medication Dose Route Frequency Provider Last Rate Last Admin   benztropine (COGENTIN) tablet  1 mg  1 mg Oral BID [name], [name], MD   1 mg at 02/01/21 1041   chlorproMAZINE (THORAZINE) tablet 250 mg  250 mg Oral Once [name], MD       diphenhydrAMINE (BENADRYL) injection 50 mg  50 mg Intramuscular Q4H PRN [name], [name], MD       divalproex (DEPAKOTE) DR tablet 1,000 mg  1,000 mg Oral Once [name], MD       divalproex (DEPAKOTE) DR tablet 1,000 mg  1,000 mg Oral Q12H [name], [name], MD   1,000 mg at 02/01/21 1039   haloperidol lactate (HALDOL) injection 10 mg  10 mg Intramuscular Q4H PRN [name], [name], MD       LORazepam (ATIVAN) injection 2 mg  2 mg Intramuscular Q4H PRN [name], [name], MD       OLANZapine (ZYPREXA) tablet 15 mg  15 mg Oral BID [name], [name], MD   15 mg at 02/01/21 1040   OLANZapine zydis (ZYPREXA) disintegrating tablet 30 mg  30 mg Oral Once [name], MD       ziprasidone (GEODON) injection 20 mg  20 mg Intramuscular Once [name], MD       Current Outpatient Medications  Medication Sig Dispense Refill   ARIPiprazole (ABILIFY) 15 MG tablet Take 1 tablet (15 mg total) by mouth daily for 8 days. 8 tablet 0   ARIPiprazole ER (ABILIFY MAINTENA) 400 MG SRER injection Inject 2 mLs (400 mg total) into the muscle every 28 (twenty-eight) days. (Patient not taking: No sig reported) 1 each    chlorproMAZINE (THORAZINE) 50 MG tablet Take 5 tablets (250 mg total) by mouth 3 (three) times daily. (Patient not taking: No sig reported) 90 tablet 0   divalproex (DEPAKOTE) 500 MG DR tablet Take 2 tablets (1,000 mg total) by mouth every 12 (twelve) hours. (Patient not taking: No sig reported) 120 tablet 1   OLANZapine zydis (ZYPREXA) 15 MG disintegrating tablet Take 2 tablets (30 mg total) by mouth at bedtime. (Patient not taking: No sig reported) 60 tablet 0    Musculoskeletal: Strength & Muscle Tone: within normal limits Gait & Station: normal Patient leans: N/A  Psychiatric Specialty Exam: Physical Exam Vitals and nursing note reviewed.  Constitutional:      Appearance: Normal appearance.  HENT:     Head: Normocephalic and atraumatic.     Mouth/Throat:     Pharynx: Oropharynx is clear.  Eyes:     Pupils: Pupils are equal, round, and reactive to light.  Cardiovascular:     Rate and Rhythm: Normal rate and regular rhythm.  Pulmonary:     Effort: Pulmonary effort is normal.     Breath sounds: Normal breath sounds.  Abdominal:     General: Abdomen is flat.     Palpations: Abdomen is soft.  Musculoskeletal:        General: Normal range of motion.  Skin:     General: Skin is warm and dry.  Neurological:     General: No focal deficit present.     Mental Status: He is alert. Mental status is at baseline.  Psychiatric:        Attention and Perception: Attention normal.        Mood and Affect: Mood is elated.        Speech: Speech normal.        Behavior: Behavior is agitated. Behavior is not aggressive or hyperactive.        Thought  Content: Thought content normal.        Cognition and Memory: Memory is impaired.        Judgment: Judgment is impulsive.    Review of Systems  Constitutional: Negative.   HENT: Negative.    Eyes: Negative.   Respiratory: Negative.    Cardiovascular: Negative.   Gastrointestinal: Negative.   Musculoskeletal: Negative.   Skin: Negative.   Neurological: Negative.   Psychiatric/Behavioral: Negative.     Blood pressure 124/76, pulse 76, temperature 98.6 F (37 C), temperature source Oral, resp. rate 17, SpO2 98 %.There is no height or weight on file to calculate BMI.  General Appearance: Casual  Eye Contact:  Fair  Speech:  Normal Rate  Volume:  Normal  Mood:  Euthymic  Affect:  Blunt  Thought Process:  Coherent and Descriptions of Associations: Intact  Orientation:  Full (Time, Place, and Person)  Thought Content:   more logical  Suicidal Thoughts:  No  Homicidal Thoughts:  No  Memory:  Immediate;   Fair Recent;   Fair Remote;   Fair  Judgement:  Impaired  Insight:  Lacking  Psychomotor Activity:  Decreased  Concentration:  Concentration: Fair and Attention Span: Fair  Recall:  [employer] of Knowledge:  Fair  Language:  Good  Akathisia:  No  Handed:  Right  AIMS (if indicated):     Assets:  Housing Leisure Time Physical Health Resilience Social Support  ADL's:  Intact  Cognition:  WNL  Sleep:        Physical Exam: Physical Exam Vitals and nursing note reviewed.  Constitutional:      Appearance: Normal appearance.  HENT:     Head: Normocephalic and atraumatic.     Mouth/Throat:      Pharynx: Oropharynx is clear.  Eyes:     Pupils: Pupils are equal, round, and reactive to light.  Cardiovascular:     Rate and Rhythm: Normal rate and regular rhythm.  Pulmonary:     Effort: Pulmonary effort is normal.     Breath sounds: Normal breath sounds.  Abdominal:     General: Abdomen is flat.     Palpations: Abdomen is soft.  Musculoskeletal:        General: Normal range of motion.  Skin:    General: Skin is warm and dry.  Neurological:     General: No focal deficit present.     Mental Status: He is alert. Mental status is at baseline.  Psychiatric:        Attention and Perception: Attention normal.        Mood and Affect: Mood is elated.        Speech: Speech normal.        Behavior: Behavior is agitated. Behavior is not aggressive or hyperactive.        Thought Content: Thought content normal.        Cognition and Memory: Memory is impaired.        Judgment: Judgment is impulsive.   Review of Systems  Constitutional: Negative.   HENT: Negative.    Eyes: Negative.   Respiratory: Negative.    Cardiovascular: Negative.   Gastrointestinal: Negative.   Musculoskeletal: Negative.   Skin: Negative.   Neurological: Negative.   Psychiatric/Behavioral: Negative.    Blood pressure 124/76, pulse 76, temperature 98.6 F (37 C), temperature source Oral, resp. rate 17, SpO2 98 %. There is no height or weight on file to calculate BMI.  Treatment Plan Summary: Per Dr. Toni Amend on  6/30: Plan no change to current medication orders.  Encourage patient to take his medicine and thanked him for his compliance.  Unclear when we will be able to make a decision.  Starts to seem more unlikely that we are going to get him back to [facility] we may have to consider discharge but only after consultation with his ACT team and his guardian  Schizoaffective disorder, bipolar type: -Continue Depakote 1000 mg BID -Continue Zyprexa 15 mg BID   EPS: -Continue Cogentin 1 mg  BID  Disposition: No evidence of imminent risk to self or others at present.   Supportive therapy provided about ongoing stressors.  Nanine MeansJamison Bill Mcvey, NP 02/01/2021 1:14 PM

## 2021-02-01 NOTE — ED Notes (Signed)
Pt given dinner tray an juice. Pt laying on mattress in the bathroom floor states is warmer in there. No other concerns at this time

## 2021-02-01 NOTE — ED Notes (Signed)
IVC  [facility]  WAITLIST 

## 2021-02-01 NOTE — ED Notes (Signed)
Patient moved mattress from room and placed it in the bathroom where he has been laying on the mattress drawing and taking naps from time to to time. Reports it is warmer in the bathroom.  Frequent checks on patient.

## 2021-02-01 NOTE — ED Notes (Signed)
VS not taken, patient asleep 

## 2021-02-02 DIAGNOSIS — F250 Schizoaffective disorder, bipolar type: Secondary | ICD-10-CM | POA: Diagnosis not present

## 2021-02-02 NOTE — ED Notes (Signed)
Hourly rounding performed, patient currently awake in room. Patient has no complaints at this time. Q15 minute rounds and monitoring via Security Cameras to continue. 

## 2021-02-02 NOTE — Consult Note (Signed)
[facility] Face-to-Face Psychiatry Consult   Reason for Consult: Follow-up for this patient with schizoaffective disorder in the emergency room Referring Physician: Scotty Court Patient Identification: Jackson [name] MRN:  [id number] Principal Diagnosis: Schizoaffective disorder, bipolar type (HCC) Diagnosis:  Principal Problem:   Schizoaffective disorder, bipolar type (HCC)  Total Time spent with patient: 15 minutes  Subjective:   Jackson [name] is a 35 y.o. male patient stated, "I'm fine".  Today, he continues to take his medications and continues to be cooperative.  He moved his mattress into the bathroom and sleeping there as it is warmer.  Jackson [name] is hanging out in the bathroom during the day, coloring at times.  Alternates between coloring, sleeping, and eating during the day.  He's more into the drawing now versus than jumping around at times in the hallway.  Per Dr Toni Amend on 6/30: HPI: Follow-up with this patient with schizoaffective disorder.  When he first came into the emergency room he was very agitated clearly manic behaving in ways that were potentially dangerous to others and to himself.  Over the last several days he has been much more compliant with oral medicine.  Taking Depakote regularly.  As expected mood has improved.  He has become less agitated.  Still has part of the day in which he is singing but has not been nearly as destructive.  During interview he is able to sit still and not snap his fingers constantly.  Still has poor insight and denies there was any reason to come into the hospital.  Past Psychiatric History: History of schizoaffective disorder several prior hospitalizations including [facility] hospital [facility]  Risk to Self:  none Risk to Others:  none Prior Inpatient Therapy:  several Prior Outpatient Therapy:  ACT team  Past Medical History:  Past Medical History:  Diagnosis Date   Bipolar 1 disorder (HCC)    Schizoaffective disorder (HCC)    No past surgical  history on file. Family History: No family history on file. Family Psychiatric  History: See previous Social History:  Social History   Substance and Sexual Activity  Alcohol Use Not Currently     Social History   Substance and Sexual Activity  Drug Use Not Currently    Social History   Socioeconomic History   Marital status: Single    Spouse name: Not on file   Number of children: Not on file   Years of education: Not on file   Highest education level: Not on file  Occupational History   Not on file  Tobacco Use   Smoking status: Light Smoker    Packs/day: 0.25    Pack years: 0.00    Types: Cigarettes   Smokeless tobacco: Never  Vaping Use   Vaping Use: Never used  Substance and Sexual Activity   Alcohol use: Not Currently   Drug use: Not Currently   Sexual activity: Not on file  Other Topics Concern   Not on file  Social History Narrative   Not on file   Social Determinants of Health   Financial Resource Strain: Not on file  Food Insecurity: Not on file  Transportation Needs: Not on file  Physical Activity: Not on file  Stress: Not on file  Social Connections: Not on file   Additional Social History:    Allergies:  No Known Allergies  Labs: No results found for this or any previous visit (from the past 48 hour(s)).  Current Facility-Administered Medications  Medication Dose Route Frequency Provider Last Rate Last Admin  benztropine (COGENTIN) tablet 1 mg  1 mg Oral BID [name], [name], MD   1 mg at 02/02/21 1004   chlorproMAZINE (THORAZINE) tablet 250 mg  250 mg Oral Once [name], MD       diphenhydrAMINE (BENADRYL) injection 50 mg  50 mg Intramuscular Q4H PRN [name], [name], MD       divalproex (DEPAKOTE) DR tablet 1,000 mg  1,000 mg Oral Once [name], MD       divalproex (DEPAKOTE) DR tablet 1,000 mg  1,000 mg Oral Q12H [name], [name], MD   1,000 mg at 02/02/21 1004   haloperidol lactate (HALDOL) injection 10 mg  10 mg  Intramuscular Q4H PRN [name], [name], MD       LORazepam (ATIVAN) injection 2 mg  2 mg Intramuscular Q4H PRN [name], [name], MD       OLANZapine (ZYPREXA) tablet 15 mg  15 mg Oral BID [name], [name], MD   15 mg at 02/02/21 1004   OLANZapine zydis (ZYPREXA) disintegrating tablet 30 mg  30 mg Oral Once [name], MD       ziprasidone (GEODON) injection 20 mg  20 mg Intramuscular Once [name], MD       Current Outpatient Medications  Medication Sig Dispense Refill   ARIPiprazole (ABILIFY) 15 MG tablet Take 1 tablet (15 mg total) by mouth daily for 8 days. 8 tablet 0   ARIPiprazole ER (ABILIFY MAINTENA) 400 MG SRER injection Inject 2 mLs (400 mg total) into the muscle every 28 (twenty-eight) days. (Patient not taking: No sig reported) 1 each    chlorproMAZINE (THORAZINE) 50 MG tablet Take 5 tablets (250 mg total) by mouth 3 (three) times daily. (Patient not taking: No sig reported) 90 tablet 0   divalproex (DEPAKOTE) 500 MG DR tablet Take 2 tablets (1,000 mg total) by mouth every 12 (twelve) hours. (Patient not taking: No sig reported) 120 tablet 1   OLANZapine zydis (ZYPREXA) 15 MG disintegrating tablet Take 2 tablets (30 mg total) by mouth at bedtime. (Patient not taking: No sig reported) 60 tablet 0    Musculoskeletal: Strength & Muscle Tone: within normal limits Gait & Station: normal Patient leans: N/A  Psychiatric Specialty Exam: Physical Exam Vitals and nursing note reviewed.  Constitutional:      Appearance: Normal appearance.  HENT:     Head: Normocephalic and atraumatic.     Mouth/Throat:     Pharynx: Oropharynx is clear.  Eyes:     Pupils: Pupils are equal, round, and reactive to light.  Cardiovascular:     Rate and Rhythm: Normal rate and regular rhythm.  Pulmonary:     Effort: Pulmonary effort is normal.     Breath sounds: Normal breath sounds.  Abdominal:     General: Abdomen is flat.     Palpations: Abdomen is soft.  Musculoskeletal:        General:  Normal range of motion.  Skin:    General: Skin is warm and dry.  Neurological:     General: No focal deficit present.     Mental Status: He is alert. Mental status is at baseline.  Psychiatric:        Attention and Perception: Attention normal.        Mood and Affect: Mood is elated.        Speech: Speech normal.        Behavior: Behavior is agitated. Behavior is not aggressive or hyperactive.  Thought Content: Thought content normal.        Cognition and Memory: Memory is impaired.        Judgment: Judgment is impulsive.    Review of Systems  Constitutional: Negative.   HENT: Negative.    Eyes: Negative.   Respiratory: Negative.    Cardiovascular: Negative.   Gastrointestinal: Negative.   Musculoskeletal: Negative.   Skin: Negative.   Neurological: Negative.   Psychiatric/Behavioral: Negative.     Blood pressure 107/82, pulse 100, temperature 98.2 F (36.8 C), temperature source Oral, resp. rate 18, SpO2 99 %.There is no height or weight on file to calculate BMI.  General Appearance: Casual  Eye Contact:  Fair  Speech:  Normal Rate  Volume:  Normal  Mood:  Euthymic  Affect:  Blunt  Thought Process:  Coherent and Descriptions of Associations: Intact  Orientation:  Full (Time, Place, and Person)  Thought Content:   more logical  Suicidal Thoughts:  No  Homicidal Thoughts:  No  Memory:  Immediate;   Fair Recent;   Fair Remote;   Fair  Judgement:  Impaired  Insight:  Lacking  Psychomotor Activity:  Decreased  Concentration:  Concentration: Fair and Attention Span: Fair  Recall:  [employer] of Knowledge:  Fair  Language:  Good  Akathisia:  No  Handed:  Right  AIMS (if indicated):     Assets:  Housing Leisure Time Physical Health Resilience Social Support  ADL's:  Intact  Cognition:  WNL  Sleep:        Physical Exam: Physical Exam Vitals and nursing note reviewed.  Constitutional:      Appearance: Normal appearance.  HENT:     Head: Normocephalic  and atraumatic.     Mouth/Throat:     Pharynx: Oropharynx is clear.  Eyes:     Pupils: Pupils are equal, round, and reactive to light.  Cardiovascular:     Rate and Rhythm: Normal rate and regular rhythm.  Pulmonary:     Effort: Pulmonary effort is normal.     Breath sounds: Normal breath sounds.  Abdominal:     General: Abdomen is flat.     Palpations: Abdomen is soft.  Musculoskeletal:        General: Normal range of motion.  Skin:    General: Skin is warm and dry.  Neurological:     General: No focal deficit present.     Mental Status: He is alert. Mental status is at baseline.  Psychiatric:        Attention and Perception: Attention normal.        Mood and Affect: Mood is elated.        Speech: Speech normal.        Behavior: Behavior is agitated. Behavior is not aggressive or hyperactive.        Thought Content: Thought content normal.        Cognition and Memory: Memory is impaired.        Judgment: Judgment is impulsive.   Review of Systems  Constitutional: Negative.   HENT: Negative.    Eyes: Negative.   Respiratory: Negative.    Cardiovascular: Negative.   Gastrointestinal: Negative.   Musculoskeletal: Negative.   Skin: Negative.   Neurological: Negative.   Psychiatric/Behavioral: Negative.    Blood pressure 107/82, pulse 100, temperature 98.2 F (36.8 C), temperature source Oral, resp. rate 18, SpO2 99 %. There is no height or weight on file to calculate BMI.  Treatment Plan Summary: Per Dr. Toni Amend  on 6/30: Plan no change to current medication orders.  Encourage patient to take his medicine and thanked him for his compliance.  Unclear when we will be able to make a decision.  Starts to seem more unlikely that we are going to get him back to [facility] we may have to consider discharge but only after consultation with his ACT team and his guardian  Schizoaffective disorder, bipolar type: -Continue Depakote 1000 mg BID -Continue Zyprexa 15 mg BID    EPS: -Continue Cogentin 1 mg BID  Disposition: No evidence of imminent risk to self or others at present.   Supportive therapy provided about ongoing stressors.  Nanine MeansJamison Rosemary Pentecost, NP 02/02/2021 1:35 PM

## 2021-02-02 NOTE — ED Notes (Signed)
Refused VS

## 2021-02-02 NOTE — ED Notes (Signed)
Report received from Jeannette, RN including SBAR. Patient alert and oriented, warm and dry, in no acute distress. Patient denies SI, HI, AVH and pain. Patient made aware of Q15 minute rounds and security cameras for their safety. Patient instructed to come to this nurse with needs or concerns. 

## 2021-02-02 NOTE — ED Notes (Signed)
Hourly rounding performed, patient currently awake in restroom. Patient has no complaints at this time. Q15 minute rounds and monitoring via Security Cameras to continue. 

## 2021-02-02 NOTE — ED Provider Notes (Signed)
Emergency Medicine Observation Re-evaluation Note  Jackson [name] is a 35 y.o. male, seen on rounds today.  Pt initially presented to the ED for complaints of No chief complaint on file. Currently, the patient is resting in bed.  Physical Exam  BP 107/82 (BP Location: Right Arm)   Pulse 100   Temp 98.2 F (36.8 C) (Oral)   Resp 19   SpO2 99%  Physical Exam General: NAD Cardiac: well perfused Lungs: unlabored resp Psych: currently calm and resting  ED Course / MDM  EKG:   I have reviewed the labs performed to date as well as medications administered while in observation.  Recent changes in the last 24 hours include none .  Plan  Current plan is for psych eval. Patient is under full IVC at this time.   Willy Eddy, MD 02/02/21 [phone]

## 2021-02-02 NOTE — ED Notes (Signed)
Patient took medications without hesitation, asked to open mouth for visual check. Patient swallowed all meds.

## 2021-02-02 NOTE — BH Assessment (Signed)
Writer followed up with [facility] ([phone]), patient remains on their wait list.

## 2021-02-02 NOTE — Consult Note (Addendum)
Client's guardian was contacted, Jackson [name], to receive input on how she thinks her son is doing.  TTS, [employer], witnessed this phone call.  She reports he "went off" on them yesterday while on the phone.  The RN, however, reported to this provider that his sister called and he was talking to her until she handed the phone to his mother, then he said he did not want to talk to his mother.  He stopped talking and smiling until he heard his mother's voice.  He then handed the phone to the tech as he did not want to talk to her.  Jackson [name] did talk to his father.  Based on the RN's observation of the interaction, he was not unstable with the interaction.  His mother is concerned that he is "playing the game" so he can discharge and is not really taking his medications, history of spitting them out.  Assured her the staff would monitor this closely.  Then, she was concerned he was only taking some of his medications.  Assured her this is also monitored.  Next, she inquired about a long-term injectable for Zyprexa.  Explained to her and her husband that this medication is limited in distribution and should be explored by his ACT team.  Finally, she requested a 90-day IVC for forced medications which I let her know I would inquire about the process.  It was explained that this was a preliminary meeting to work on discharge planning if stabilization continues.    Nursing orders placed to assure he is monitored after taking medications and taking all of his medications.  If this is the case, then please note it in his chart.  The RN today stated she had him open his mouth after swallowing to assure this process.  She also explained each medicine and he was compliant in taking each one.  Nanine Means, PMHNP

## 2021-02-02 NOTE — ED Notes (Signed)
Pt given lunch tray.

## 2021-02-02 NOTE — ED Notes (Signed)
Pt refused vs. Jeannette,RN aware. Shower offered. Breakfast tray given.

## 2021-02-02 NOTE — ED Notes (Signed)
Patient sleeping comfortably, will continue to monitor

## 2021-02-02 NOTE — ED Notes (Signed)
Pt has reportedly moved all belongings into restroom for last couple of days, pt is in restroom with mattress and blankets. Pt reports that it is warmer in bathroom.

## 2021-02-03 DIAGNOSIS — F250 Schizoaffective disorder, bipolar type: Secondary | ICD-10-CM | POA: Diagnosis not present

## 2021-02-03 NOTE — ED Notes (Signed)
Hourly rounding performed, patient currently awake in restroom. Patient has no complaints at this time. Q15 minute rounds and monitoring via Security Cameras to continue. 

## 2021-02-03 NOTE — ED Notes (Signed)
Patient took scheduled medications without hesitation, opened mouth and lifted his tounge, buccal cheeks checked patient swallowed.

## 2021-02-03 NOTE — ED Notes (Signed)
IVC needs to be renewed by 10:30 AM 02/03/21

## 2021-02-03 NOTE — ED Provider Notes (Signed)
Emergency Medicine Observation Re-evaluation Note  Jackson [name] is a 35 y.o. male, seen on rounds today.  Pt initially presented to the ED for complaints of No chief complaint on file.  Currently, the patient is is no acute distress. Sleeping in bed   Physical Exam  Blood pressure 106/74, pulse 84, temperature 98 F (36.7 C), temperature source Oral, resp. rate 18, SpO2 99 %.  Physical Exam General: No apparent distress HEENT: moist mucous membranes CV: RRR Pulm: Normal WOB GI: soft and non tender MSK: no edema or cyanosis      ED Course / MDM     I have reviewed the labs performed to date as well as medications administered while in observation.  Recent changes in the last 24 hours include none   Plan   Current plan is to continue to wait for psych plan/placement if felt warranted  Patient is under full IVC at this time.   Concha Se, MD 02/03/21 [id number]

## 2021-02-03 NOTE — ED Notes (Signed)
Pt remains in restroom, checked on pt and he states he is okay and will be done soon.

## 2021-02-03 NOTE — ED Notes (Signed)
Hourly rounding performed, patient currently asleep in room. Patient has no complaints at this time. Q15 minute rounds and monitoring via Security Cameras to continue. 

## 2021-02-03 NOTE — ED Notes (Signed)
Pt provided with [place] sandwich and juice per request

## 2021-02-03 NOTE — ED Notes (Signed)
Report received from Jeannette, RN including SBAR. Patient alert and oriented, warm and dry, in no acute distress. Patient denies SI, HI, AVH and pain. Patient made aware of Q15 minute rounds and security cameras for their safety. Patient instructed to come to this nurse with needs or concerns. 

## 2021-02-03 NOTE — Consult Note (Signed)
[facility] Face-to-Face Psychiatry Consult   Reason for Consult: Follow-up for this patient with schizoaffective disorder in the emergency room Referring Physician: Scotty Court Patient Identification: Jackson [name] MRN:  [id number] Principal Diagnosis: Schizoaffective disorder, bipolar type (HCC) Diagnosis:  Principal Problem:   Schizoaffective disorder, bipolar type (HCC)  Total Time spent with patient: 15 minutes  Subjective:   Jackson [name] is a 35 y.o. male patient stated, "I'm ready to go back to my apartment".  Today, he continues to take his medications and be cooperative.  His room was moved from the seclusion area to room 2 without issues.  On assessment, he is calmly watching television.  He reports he is ready to return to his apartment and does not feel he needed to come to the ED.  His mood appears to be at his baseline, rational and no issues noted.  This provider spoke with his guardian yesterday who had multiple requests that were put in place, nursing orders to monitor medication ingestion and taking his medications.  Hoping to have a meeting with his ACT team on Tuesday to develop a discharge plan for Rachael as he is stable and has been for a few days.  The holiday is slowing things at this time.    Per Dr Toni Amend on 6/30: HPI: Follow-up with this patient with schizoaffective disorder.  When he first came into the emergency room he was very agitated clearly manic behaving in ways that were potentially dangerous to others and to himself.  Over the last several days he has been much more compliant with oral medicine.  Taking Depakote regularly.  As expected mood has improved.  He has become less agitated.  Still has part of the day in which he is singing but has not been nearly as destructive.  During interview he is able to sit still and not snap his fingers constantly.  Still has poor insight and denies there was any reason to come into the hospital.  Past Psychiatric History:  History of schizoaffective disorder several prior hospitalizations including state hospital stays  Risk to Self:  none Risk to Others:  none Prior Inpatient Therapy:  several Prior Outpatient Therapy:  ACT team  Past Medical History:  Past Medical History:  Diagnosis Date   Bipolar 1 disorder (HCC)    Schizoaffective disorder (HCC)    No past surgical history on file. Family History: No family history on file. Family Psychiatric  History: See previous Social History:  Social History   Substance and Sexual Activity  Alcohol Use Not Currently     Social History   Substance and Sexual Activity  Drug Use Not Currently    Social History   Socioeconomic History   Marital status: Single    Spouse name: Not on file   Number of children: Not on file   Years of education: Not on file   Highest education level: Not on file  Occupational History   Not on file  Tobacco Use   Smoking status: Light Smoker    Packs/day: 0.25    Pack years: 0.00    Types: Cigarettes   Smokeless tobacco: Never  Vaping Use   Vaping Use: Never used  Substance and Sexual Activity   Alcohol use: Not Currently   Drug use: Not Currently   Sexual activity: Not on file  Other Topics Concern   Not on file  Social History Narrative   Not on file   Social Determinants of Health   Financial Resource Strain: Not on  file  Food Insecurity: Not on file  Transportation Needs: Not on file  Physical Activity: Not on file  Stress: Not on file  Social Connections: Not on file   Additional Social History:    Allergies:  No Known Allergies  Labs: No results found for this or any previous visit (from the past 48 hour(s)).  Current Facility-Administered Medications  Medication Dose Route Frequency Provider Last Rate Last Admin   benztropine (COGENTIN) tablet 1 mg  1 mg Oral BID [name], [name], MD   1 mg at 02/02/21 2138   chlorproMAZINE (THORAZINE) tablet 250 mg  250 mg Oral Once [name], MD        diphenhydrAMINE (BENADRYL) injection 50 mg  50 mg Intramuscular Q4H PRN [name], [name], MD       divalproex (DEPAKOTE) DR tablet 1,000 mg  1,000 mg Oral Once [name], MD       divalproex (DEPAKOTE) DR tablet 1,000 mg  1,000 mg Oral Q12H [name], [name], MD   1,000 mg at 02/02/21 2138   haloperidol lactate (HALDOL) injection 10 mg  10 mg Intramuscular Q4H PRN [name], [name], MD       LORazepam (ATIVAN) injection 2 mg  2 mg Intramuscular Q4H PRN [name], [name], MD       OLANZapine (ZYPREXA) tablet 15 mg  15 mg Oral BID [name], [name], MD   15 mg at 02/02/21 2138   OLANZapine zydis (ZYPREXA) disintegrating tablet 30 mg  30 mg Oral Once [name], MD       ziprasidone (GEODON) injection 20 mg  20 mg Intramuscular Once [name], MD       Current Outpatient Medications  Medication Sig Dispense Refill   ARIPiprazole (ABILIFY) 15 MG tablet Take 1 tablet (15 mg total) by mouth daily for 8 days. 8 tablet 0   ARIPiprazole ER (ABILIFY MAINTENA) 400 MG SRER injection Inject 2 mLs (400 mg total) into the muscle every 28 (twenty-eight) days. (Patient not taking: No sig reported) 1 each    chlorproMAZINE (THORAZINE) 50 MG tablet Take 5 tablets (250 mg total) by mouth 3 (three) times daily. (Patient not taking: No sig reported) 90 tablet 0   divalproex (DEPAKOTE) 500 MG DR tablet Take 2 tablets (1,000 mg total) by mouth every 12 (twelve) hours. (Patient not taking: No sig reported) 120 tablet 1   OLANZapine zydis (ZYPREXA) 15 MG disintegrating tablet Take 2 tablets (30 mg total) by mouth at bedtime. (Patient not taking: No sig reported) 60 tablet 0    Musculoskeletal: Strength & Muscle Tone: within normal limits Gait & Station: normal Patient leans: N/A  Psychiatric Specialty Exam: Physical Exam Vitals and nursing note reviewed.  Constitutional:      Appearance: Normal appearance.  HENT:     Head: Normocephalic and atraumatic.     Mouth/Throat:     Pharynx: Oropharynx is  clear.  Eyes:     Pupils: Pupils are equal, round, and reactive to light.  Cardiovascular:     Rate and Rhythm: Normal rate and regular rhythm.  Pulmonary:     Effort: Pulmonary effort is normal.     Breath sounds: Normal breath sounds.  Abdominal:     General: Abdomen is flat.     Palpations: Abdomen is soft.  Musculoskeletal:        General: Normal range of motion.  Skin:    General: Skin is warm and dry.  Neurological:     General: No focal  deficit present.     Mental Status: He is alert. Mental status is at baseline.  Psychiatric:        Attention and Perception: Attention normal.        Mood and Affect: Mood is elated.        Speech: Speech normal.        Behavior: Behavior is agitated. Behavior is not aggressive or hyperactive.        Thought Content: Thought content normal.        Cognition and Memory: Memory is impaired.        Judgment: Judgment is impulsive.    Review of Systems  Constitutional: Negative.   HENT: Negative.    Eyes: Negative.   Respiratory: Negative.    Cardiovascular: Negative.   Gastrointestinal: Negative.   Musculoskeletal: Negative.   Skin: Negative.   Neurological: Negative.   Psychiatric/Behavioral: Negative.     Blood pressure 106/74, pulse 84, temperature 98 F (36.7 C), temperature source Oral, resp. rate 18, SpO2 99 %.There is no height or weight on file to calculate BMI.  General Appearance: Casual  Eye Contact:  Fair  Speech:  Normal Rate  Volume:  Normal  Mood:  Euthymic  Affect:  Blunt  Thought Process:  Coherent and Descriptions of Associations: Intact  Orientation:  Full (Time, Place, and Person)  Thought Content:   more logical  Suicidal Thoughts:  No  Homicidal Thoughts:  No  Memory:  Immediate;   Fair Recent;   Fair Remote;   Fair  Judgement:  Impaired  Insight:  Lacking  Psychomotor Activity:  Decreased  Concentration:  Concentration: Fair and Attention Span: Fair  Recall:  [employer] of Knowledge:  Fair   Language:  Good  Akathisia:  No  Handed:  Right  AIMS (if indicated):     Assets:  Housing Leisure Time Physical Health Resilience Social Support  ADL's:  Intact  Cognition:  WNL  Sleep:        Physical Exam: Physical Exam Vitals and nursing note reviewed.  Constitutional:      Appearance: Normal appearance.  HENT:     Head: Normocephalic and atraumatic.     Mouth/Throat:     Pharynx: Oropharynx is clear.  Eyes:     Pupils: Pupils are equal, round, and reactive to light.  Cardiovascular:     Rate and Rhythm: Normal rate and regular rhythm.  Pulmonary:     Effort: Pulmonary effort is normal.     Breath sounds: Normal breath sounds.  Abdominal:     General: Abdomen is flat.     Palpations: Abdomen is soft.  Musculoskeletal:        General: Normal range of motion.  Skin:    General: Skin is warm and dry.  Neurological:     General: No focal deficit present.     Mental Status: He is alert. Mental status is at baseline.  Psychiatric:        Attention and Perception: Attention normal.        Mood and Affect: Mood is elated.        Speech: Speech normal.        Behavior: Behavior is agitated. Behavior is not aggressive or hyperactive.        Thought Content: Thought content normal.        Cognition and Memory: Memory is impaired.        Judgment: Judgment is impulsive.   Review of Systems  Constitutional: Negative.  HENT: Negative.    Eyes: Negative.   Respiratory: Negative.    Cardiovascular: Negative.   Gastrointestinal: Negative.   Musculoskeletal: Negative.   Skin: Negative.   Neurological: Negative.   Psychiatric/Behavioral: Negative.    Blood pressure 106/74, pulse 84, temperature 98 F (36.7 C), temperature source Oral, resp. rate 18, SpO2 99 %. There is no height or weight on file to calculate BMI.  Treatment Plan Summary: Per Dr. Toni Amendlapacs on 6/30: Plan no change to current medication orders.  Encourage patient to take his medicine and thanked him  for his compliance.  Unclear when we will be able to make a decision.  Starts to seem more unlikely that we are going to get him back to [facility] we may have to consider discharge but only after consultation with his ACT team and his guardian  Schizoaffective disorder, bipolar type: -Continue Depakote 1000 mg BID -Continue Zyprexa 15 mg BID   EPS: -Continue Cogentin 1 mg BID  Disposition: No evidence of imminent risk to self or others at present.   Supportive therapy provided about ongoing stressors.  Nanine MeansJamison Elick Aguilera, NP 02/03/2021 8:31 AM

## 2021-02-03 NOTE — ED Notes (Signed)
Conversation carried on with pt following med admin and pt mouth checked, pt swallowed medications and is not cheeking them

## 2021-02-03 NOTE — BH Assessment (Signed)
Writer followed up with [facility] ([phone]), patient remains on their wait list.

## 2021-02-03 NOTE — ED Notes (Signed)
Pt ate breakfast Pt is now in shower. PT received clean scrub set.  Pt has been calm and cooperative thus far today

## 2021-02-03 NOTE — ED Notes (Signed)
Patient sleeping late this morning awakened for breakfast and scheduled medications.  

## 2021-02-03 NOTE — ED Notes (Signed)
Patient at nursing station requesting drawing paper and new crayon box.

## 2021-02-03 NOTE — ED Notes (Signed)
Patient requesting shower this morning. Clean clothes and oral hygiene supplies provided.

## 2021-02-03 NOTE — ED Notes (Signed)
Pt asleep at this time, unable to collect vitals. Will collect pt vitals once awake. 

## 2021-02-03 NOTE — ED Notes (Signed)
Hourly rounding performed, patient currently awake in room. Patient has no complaints at this time. Q15 minute rounds and monitoring via Security Cameras to continue. 

## 2021-02-03 NOTE — ED Notes (Signed)
Pt continues with his magical thinking and delusions. Still believes he can create items and has AI in his head and ability to design mental abilities and pass this to others.

## 2021-02-04 DIAGNOSIS — F250 Schizoaffective disorder, bipolar type: Secondary | ICD-10-CM | POA: Diagnosis not present

## 2021-02-04 MED ORDER — MAGIC MOUTHWASH
10.0000 mL | Freq: Three times a day (TID) | ORAL | Status: DC | PRN
  Filled 2021-02-04: qty 10

## 2021-02-04 NOTE — ED Notes (Signed)
Pt takes meds, pt does not cheek medications or spit them out as this nurse stayed with pt and talked for several minutes after pt took meds and checked mouth.

## 2021-02-04 NOTE — Consult Note (Signed)
[facility] Face-to-Face Psychiatry Consult   Reason for Consult: Follow-up for this patient with schizoaffective disorder in the emergency room Referring Physician: Scotty Court Patient Identification: Jackson [name] MRN:  [id number] Principal Diagnosis: Schizoaffective disorder, bipolar type (HCC) Diagnosis:  Principal Problem:   Schizoaffective disorder, bipolar type (HCC)  Total Time spent with patient: 15 minutes  Subjective:   Jackson [name] is a 35 y.o. male patient stated, "My phone died and my mother panicked and called the ACT team who has a new person".  Today, he continues to take his medications and be cooperative.  He is pleasant, engaging in the conversation, interacting appropriately with the staff and peers.  No issues noted.  Sleep and appetite are "good".  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  Awaiting a meeting tomorrow with his ACT team to transition back to his apartment.  His mother was called, legal guardian, on Saturday, 7/2 to let her know of the plan.   Per Dr Toni Amend on 6/30: HPI: Follow-up with this patient with schizoaffective disorder.  When he first came into the emergency room he was very agitated clearly manic behaving in ways that were potentially dangerous to others and to himself.  Over the last several days he has been much more compliant with oral medicine.  Taking Depakote regularly.  As expected mood has improved.  He has become less agitated.  Still has part of the day in which he is singing but has not been nearly as destructive.  During interview he is able to sit still and not snap his fingers constantly.  Still has poor insight and denies there was any reason to come into the hospital.  Past Psychiatric History: History of schizoaffective disorder several prior hospitalizations including [facility] hospital [facility]  Risk to Self:  none Risk to Others:  none Prior Inpatient Therapy:  several Prior Outpatient Therapy:  ACT team  Past  Medical History:  Past Medical History:  Diagnosis Date   Bipolar 1 disorder (HCC)    Schizoaffective disorder (HCC)    No past surgical history on file. Family History: No family history on file. Family Psychiatric  History: See previous Social History:  Social History   Substance and Sexual Activity  Alcohol Use Not Currently     Social History   Substance and Sexual Activity  Drug Use Not Currently    Social History   Socioeconomic History   Marital status: Single    Spouse name: Not on file   Number of children: Not on file   Years of education: Not on file   Highest education level: Not on file  Occupational History   Not on file  Tobacco Use   Smoking status: Light Smoker    Packs/day: 0.25    Pack years: 0.00    Types: Cigarettes   Smokeless tobacco: Never  Vaping Use   Vaping Use: Never used  Substance and Sexual Activity   Alcohol use: Not Currently   Drug use: Not Currently   Sexual activity: Not on file  Other Topics Concern   Not on file  Social History Narrative   Not on file   Social Determinants of Health   Financial Resource Strain: Not on file  Food Insecurity: Not on file  Transportation Needs: Not on file  Physical Activity: Not on file  Stress: Not on file  Social Connections: Not on file   Additional Social History:    Allergies:  No Known Allergies  Labs: No results  found for this or any previous visit (from the past 48 hour(s)).  Current Facility-Administered Medications  Medication Dose Route Frequency Provider Last Rate Last Admin   benztropine (COGENTIN) tablet 1 mg  1 mg Oral BID [name], Jackquline DenmarkJohn T, MD   1 mg at 02/04/21 [id number]   chlorproMAZINE (THORAZINE) tablet 250 mg  250 mg Oral Once Shaune PollackIsaacs, Cameron, MD       diphenhydrAMINE (BENADRYL) injection 50 mg  50 mg Intramuscular Q4H PRN [name], Jackquline DenmarkJohn T, MD       divalproex (DEPAKOTE) DR tablet 1,000 mg  1,000 mg Oral Once Shaune PollackIsaacs, Cameron, MD       divalproex (DEPAKOTE) DR tablet  1,000 mg  1,000 mg Oral Q12H [name], Jackquline DenmarkJohn T, MD   1,000 mg at 02/04/21 [id number]   haloperidol lactate (HALDOL) injection 10 mg  10 mg Intramuscular Q4H PRN [name], Jackquline DenmarkJohn T, MD       LORazepam (ATIVAN) injection 2 mg  2 mg Intramuscular Q4H PRN [name], Jackquline DenmarkJohn T, MD       magic mouthwash  10 mL Oral TID PRN Delton PrairieSmith, Dylan, MD       OLANZapine (ZYPREXA) tablet 15 mg  15 mg Oral BID [name], Jackquline DenmarkJohn T, MD   15 mg at 02/04/21 [id number]   OLANZapine zydis (ZYPREXA) disintegrating tablet 30 mg  30 mg Oral Once Shaune PollackIsaacs, Cameron, MD       ziprasidone (GEODON) injection 20 mg  20 mg Intramuscular Once Shaune PollackIsaacs, Cameron, MD       Current Outpatient Medications  Medication Sig Dispense Refill   ARIPiprazole (ABILIFY) 15 MG tablet Take 1 tablet (15 mg total) by mouth daily for 8 days. 8 tablet 0   ARIPiprazole ER (ABILIFY MAINTENA) 400 MG SRER injection Inject 2 mLs (400 mg total) into the muscle every 28 (twenty-eight) days. (Patient not taking: No sig reported) 1 each    chlorproMAZINE (THORAZINE) 50 MG tablet Take 5 tablets (250 mg total) by mouth 3 (three) times daily. (Patient not taking: No sig reported) 90 tablet 0   divalproex (DEPAKOTE) 500 MG DR tablet Take 2 tablets (1,000 mg total) by mouth every 12 (twelve) hours. (Patient not taking: No sig reported) 120 tablet 1   OLANZapine zydis (ZYPREXA) 15 MG disintegrating tablet Take 2 tablets (30 mg total) by mouth at bedtime. (Patient not taking: No sig reported) 60 tablet 0    Musculoskeletal: Strength & Muscle Tone: within normal limits Gait & Station: normal Patient leans: N/A  Psychiatric Specialty Exam: Physical Exam Vitals and nursing note reviewed.  Constitutional:      Appearance: Normal appearance.  HENT:     Head: Normocephalic and atraumatic.     Mouth/Throat:     Pharynx: Oropharynx is clear.  Eyes:     Pupils: Pupils are equal, round, and reactive to light.  Cardiovascular:     Rate and Rhythm: Normal rate and regular rhythm.  Pulmonary:      Effort: Pulmonary effort is normal.     Breath sounds: Normal breath sounds.  Abdominal:     General: Abdomen is flat.     Palpations: Abdomen is soft.  Musculoskeletal:        General: Normal range of motion.  Skin:    General: Skin is warm and dry.  Neurological:     General: No focal deficit present.     Mental Status: He is alert. Mental status is at baseline.  Psychiatric:        Attention and Perception: Attention normal.  Mood and Affect: Mood is elated.        Speech: Speech normal.        Behavior: Behavior is agitated. Behavior is not aggressive or hyperactive.        Thought Content: Thought content normal.        Cognition and Memory: Memory is impaired.        Judgment: Judgment is impulsive.    Review of Systems  Constitutional: Negative.   HENT: Negative.    Eyes: Negative.   Respiratory: Negative.    Cardiovascular: Negative.   Gastrointestinal: Negative.   Musculoskeletal: Negative.   Skin: Negative.   Neurological: Negative.   Psychiatric/Behavioral: Negative.     Blood pressure 134/85, pulse 84, temperature 98.4 F (36.9 C), temperature source Oral, resp. rate 17, SpO2 100 %.There is no height or weight on file to calculate BMI.  General Appearance: Casual  Eye Contact:  Fair  Speech:  Normal Rate  Volume:  Normal  Mood:  Euthymic  Affect:  Blunt  Thought Process:  Coherent and Descriptions of Associations: Intact  Orientation:  Full (Time, Place, and Person)  Thought Content:   more logical  Suicidal Thoughts:  No  Homicidal Thoughts:  No  Memory:  Immediate;   Fair Recent;   Fair Remote;   Fair  Judgement:  Impaired  Insight:  Lacking  Psychomotor Activity:  Decreased  Concentration:  Concentration: Fair and Attention Span: Fair  Recall:  [employer] of Knowledge:  Fair  Language:  Good  Akathisia:  No  Handed:  Right  AIMS (if indicated):     Assets:  Housing Leisure Time Physical Health Resilience Social Support  ADL's:   Intact  Cognition:  WNL  Sleep:        Physical Exam: Physical Exam Vitals and nursing note reviewed.  Constitutional:      Appearance: Normal appearance.  HENT:     Head: Normocephalic and atraumatic.     Mouth/Throat:     Pharynx: Oropharynx is clear.  Eyes:     Pupils: Pupils are equal, round, and reactive to light.  Cardiovascular:     Rate and Rhythm: Normal rate and regular rhythm.  Pulmonary:     Effort: Pulmonary effort is normal.     Breath sounds: Normal breath sounds.  Abdominal:     General: Abdomen is flat.     Palpations: Abdomen is soft.  Musculoskeletal:        General: Normal range of motion.  Skin:    General: Skin is warm and dry.  Neurological:     General: No focal deficit present.     Mental Status: He is alert. Mental status is at baseline.  Psychiatric:        Attention and Perception: Attention normal.        Mood and Affect: Mood is elated.        Speech: Speech normal.        Behavior: Behavior is agitated. Behavior is not aggressive or hyperactive.        Thought Content: Thought content normal.        Cognition and Memory: Memory is impaired.        Judgment: Judgment is impulsive.   Review of Systems  Constitutional: Negative.   HENT: Negative.    Eyes: Negative.   Respiratory: Negative.    Cardiovascular: Negative.   Gastrointestinal: Negative.   Musculoskeletal: Negative.   Skin: Negative.   Neurological: Negative.   Psychiatric/Behavioral: Negative.  Blood pressure 134/85, pulse 84, temperature 98.4 F (36.9 C), temperature source Oral, resp. rate 17, SpO2 100 %. There is no height or weight on file to calculate BMI.  Treatment Plan Summary: Per Dr. Toni Amend on 6/30: Plan no change to current medication orders.  Encourage patient to take his medicine and thanked him for his compliance.  Unclear when we will be able to make a decision.  Starts to seem more unlikely that we are going to get him back to [facility] we may  have to consider discharge but only after consultation with his ACT team and his guardian  Schizoaffective disorder, bipolar type: -Continue Depakote 1000 mg BID -Continue Zyprexa 15 mg BID   EPS: -Continue Cogentin 1 mg BID  Disposition: No evidence of imminent risk to self or others at present.   Supportive therapy provided about ongoing stressors.  Nanine Means, NP 02/04/2021 11:53 AM

## 2021-02-04 NOTE — ED Provider Notes (Signed)
Emergency Medicine Observation Re-evaluation Note  Jackson [name] is a 35 y.o. male, seen on rounds today.  Pt initially presented to the ED for complaints of No chief complaint on file. Currently, the patient is resting comfortably.  Physical Exam  BP 134/85   Pulse 84   Temp 98.4 F (36.9 C) (Oral)   Resp 17   SpO2 100%  Physical Exam Constitutional:      Appearance: He is not ill-appearing or toxic-appearing.  HENT:     Head: Atraumatic.  Cardiovascular:     Comments: Well perfused Pulmonary:     Effort: Pulmonary effort is normal.  Abdominal:     General: There is no distension.  Musculoskeletal:        General: No deformity.  Skin:    Findings: No rash.  Neurological:     General: No focal deficit present.     Cranial Nerves: No cranial nerve deficit.     ED Course / MDM  EKG:   I have reviewed the labs performed to date as well as medications administered while in observation.  Recent changes in the last 24 hours include none.  Plan  Current plan is for psychiatric placement. Patient is under full IVC at this time.   Delton Prairie, MD 02/04/21 [phone]

## 2021-02-04 NOTE — ED Notes (Signed)
Patient in day room socializing with other patients. Patient calm and receptive.

## 2021-02-04 NOTE — Progress Notes (Signed)
Patient was denied by [facility] due to a recommendation for a higher level of care. Patient meets inpatient criteria per Dr. Toni Amend. Patient referred to the following facilities:  [facility]  [address] [place] [address] [phone]  [facility]  [address], [place] [address] [phone]  [facility]  [address] [place] [address] [phone]  [facility]  [address] [place] [address] [phone]  [facility]  [address] [place] [address] [phone]  [facility]  [address] [place], [place] [address] [phone]  [facility]  [address] [place], [place] [address] [phone]  [facility]  [address] [place] [address] [phone]  [facility]  [address], [place] [address] [phone]  [facility]  [address] [place] [address] [phone]    CSW will continue to monitor disposition.    Damita Dunnings, MSW, LCSW-A  10:51 AM 02/04/2021

## 2021-02-04 NOTE — ED Notes (Signed)
Hourly rounding performed, patient currently awake in room. Patient has no complaints at this time. Q15 minute rounds and monitoring via Security Cameras to continue. 

## 2021-02-04 NOTE — ED Notes (Signed)
Scheduled meds administered Oral cavity check performed. No meds noted.

## 2021-02-04 NOTE — ED Notes (Signed)
Report received from Jeannette, RN including SBAR. Patient alert and oriented, warm and dry, in no acute distress. Patient denies SI, HI, AVH and pain. Patient made aware of Q15 minute rounds and security cameras for their safety. Patient instructed to come to this nurse with needs or concerns. 

## 2021-02-04 NOTE — ED Notes (Signed)
Hourly rounding performed, patient currently asleep in room. Patient has no complaints at this time. Q15 minute rounds and monitoring via Security Cameras to continue. 

## 2021-02-04 NOTE — ED Notes (Signed)
Pt asleep at this time, unable to collect vitals. Will collect pt vitals once awake. 

## 2021-02-04 NOTE — ED Notes (Signed)
IVC, pend placement 

## 2021-02-04 NOTE — ED Notes (Signed)
Pt reports he is ready to be discharged. Pt continues with his magical thinking of being able to "summon" items and do movements to cause people to feel effects of gravity or vibrations.

## 2021-02-04 NOTE — ED Notes (Signed)
Hourly rounding performed, patient currently awake in restroom. Patient has no complaints at this time. Q15 minute rounds and monitoring via Security Cameras to continue. 

## 2021-02-05 DIAGNOSIS — F250 Schizoaffective disorder, bipolar type: Secondary | ICD-10-CM | POA: Diagnosis not present

## 2021-02-05 NOTE — ED Notes (Signed)
Patient refused vitals this morning.   

## 2021-02-05 NOTE — ED Notes (Signed)
Hourly rounding performed, patient currently asleep in room. Patient has no complaints at this time. Q15 minute rounds and monitoring via Security Cameras to continue. 

## 2021-02-05 NOTE — ED Notes (Signed)
Pt discharged home in cab with cab voucher (legal guardian gave permission).   VS stable.  All belongings (cell phone, sneakers) returned to patients.

## 2021-02-05 NOTE — ED Notes (Signed)
Pt asleep at this time, unable to collect vitals. Will collect pt vitals once awake. 

## 2021-02-05 NOTE — Consult Note (Signed)
This provider spoke with his legal guardian, [name], about her son and her requests honored.  She and her husband are concerned about his lack of awareness of his disorder.  This provider explained that this one unfortunate part of his schizoaffective disorder piece is this anosognosia where the client feels he does not have a disorder and stops taking his medications.  His therapist with the [facility] team can assist with offering reality therapy.  His mother stated she was concerned that he is not "screaming" today and therefore we feel he can be released.  This provider in the presence of the RN, Amy Boisvert, explained that he has not had this behavior in a least the past five days.  IVC resended and outpatient IVC placed per her mother's request.  Cab ordered for his place and he reports it is unlocked.  In case this is not accurate and there is not a landlord to verify per an office on the property, the number to his rental agency provided to him along with his phone the RN charged for him. He reports he can call maintenance and they will come out.  Dr. Lucianne Muss reviewed this client and agreeable to the plan for discharge.  Nanine Means, PMHNP

## 2021-02-05 NOTE — ED Notes (Signed)
Gave pt a breakfast tray with juice.

## 2021-02-05 NOTE — ED Provider Notes (Signed)
Emergency Medicine Observation Re-evaluation Note  Chas Belair is a 35 y.o. male, seen on rounds today.  Pt initially presented to the ED for complaints of No chief complaint on file. Currently, the patient is resting, denies any acute complaints.  Physical Exam  BP 112/85 (BP Location: Left Arm)   Pulse 80   Temp 98.3 F (36.8 C) (Oral)   Resp 20   SpO2 99%  Physical Exam General: no acute distress Cardiac: normal rate Lungs: equal chest rise Psych: calm  ED Course / MDM  EKG:   I have reviewed the labs performed to date as well as medications administered while in observation.  Recent changes in the last 24 hours include none.  Plan  Current plan is for mother to potentially pickup later today. Patient is under full IVC at this time.   Gilles Chiquito, MD 02/05/21 1452

## 2021-02-05 NOTE — ED Notes (Signed)
Dinner tray and grape juice given to pt. 

## 2021-02-05 NOTE — ED Notes (Signed)
Pt given an apple juice per request.

## 2021-02-05 NOTE — Consult Note (Addendum)
[facility] Psych ED Discharge  02/05/2021 2:23 PM Lynette Warning  MRN:  [id number]  Method of visit?: Face to Face   Principal Problem: Schizoaffective disorder, bipolar type [facility]) Discharge Diagnoses: Principal Problem:   Schizoaffective disorder, bipolar type (HCC)   Subjective: "I'm ready to go, it was all a misunderstanding."  35 yo male who was admitted after not taking his medications and becoming manic with psychosis.  His medications were restarted and he stabilized.  He has been taking his medications without issues with the nurses checking his mouth after administering his medications.  Antuan is taking all of his medications.  His mother was contacted on 7/2 by this provider and TTS, Shalita.  She wanted to assure he was not spitting out his medications and taking them all.  She also wanted to explore Zyprexa Reprevv, the long acting injectable.  This provider explained to her that this is available in limited quantities and would need to be accomplished by his ACT team.  This was communicated to [facility], his ACT team, today with TTS present on the call (Renee).  The ACT team member was Delice Bison.  They are also aware of him discharging today or tomorrow based on getting in touch with his guardian, his mother, and will follow up with him this week.  The guardian, [name], also wanted a 90-day outpatient commitment which will be completed prior to discharge after releasing the inpatient IVC.  She was called earlier with no response.  Dr. Lucianne Muss, the medical director, advised to try again later as a message was left.  If no response to call the police to get a welfare check as he is stable and everything the mother requested is in place.  Total Time spent with patient: 1 hour  Past Psychiatric History: schizoaffective disorder  Past Medical History:  Past Medical History:  Diagnosis Date   Bipolar 1 disorder (HCC)    Schizoaffective disorder (HCC)    No past surgical  history on file. Family History: No family history on file. Family Psychiatric  History: none Social History:  Social History   Substance and Sexual Activity  Alcohol Use Not Currently     Social History   Substance and Sexual Activity  Drug Use Not Currently    Social History   Socioeconomic History   Marital status: Single    Spouse name: Not on file   Number of children: Not on file   Years of education: Not on file   Highest education level: Not on file  Occupational History   Not on file  Tobacco Use   Smoking status: Light Smoker    Packs/day: 0.25    Pack years: 0.00    Types: Cigarettes   Smokeless tobacco: Never  Vaping Use   Vaping Use: Never used  Substance and Sexual Activity   Alcohol use: Not Currently   Drug use: Not Currently   Sexual activity: Not on file  Other Topics Concern   Not on file  Social History Narrative   Not on file   Social Determinants of Health   Financial Resource Strain: Not on file  Food Insecurity: Not on file  Transportation Needs: Not on file  Physical Activity: Not on file  Stress: Not on file  Social Connections: Not on file    Tobacco Cessation:  N/A, patient does not currently use tobacco products  Current Medications: Current Facility-Administered Medications  Medication Dose Route Frequency Provider Last Rate Last Admin   benztropine (COGENTIN) tablet  1 mg  1 mg Oral BID [name], [name], MD   1 mg at 02/05/21 6195   chlorproMAZINE (THORAZINE) tablet 250 mg  250 mg Oral Once [name], MD       diphenhydrAMINE (BENADRYL) injection 50 mg  50 mg Intramuscular Q4H PRN [name], [name], MD       divalproex (DEPAKOTE) DR tablet 1,000 mg  1,000 mg Oral Once [name], MD       divalproex (DEPAKOTE) DR tablet 1,000 mg  1,000 mg Oral Q12H [name], [name], MD   1,000 mg at 02/05/21 0920   haloperidol lactate (HALDOL) injection 10 mg  10 mg Intramuscular Q4H PRN [name], [name], MD       LORazepam (ATIVAN)  injection 2 mg  2 mg Intramuscular Q4H PRN [name], [name], MD       magic mouthwash  10 mL Oral TID PRN Delton Prairie, MD       OLANZapine (ZYPREXA) tablet 15 mg  15 mg Oral BID [name], [name], MD   15 mg at 02/05/21 0920   OLANZapine zydis (ZYPREXA) disintegrating tablet 30 mg  30 mg Oral Once [name], MD       ziprasidone (GEODON) injection 20 mg  20 mg Intramuscular Once [name], MD       Current Outpatient Medications  Medication Sig Dispense Refill   ARIPiprazole (ABILIFY) 15 MG tablet Take 1 tablet (15 mg total) by mouth daily for 8 days. 8 tablet 0   ARIPiprazole ER (ABILIFY MAINTENA) 400 MG SRER injection Inject 2 mLs (400 mg total) into the muscle every 28 (twenty-eight) days. (Patient not taking: No sig reported) 1 each    chlorproMAZINE (THORAZINE) 50 MG tablet Take 5 tablets (250 mg total) by mouth 3 (three) times daily. (Patient not taking: No sig reported) 90 tablet 0   divalproex (DEPAKOTE) 500 MG DR tablet Take 2 tablets (1,000 mg total) by mouth every 12 (twelve) hours. (Patient not taking: No sig reported) 120 tablet 1   OLANZapine zydis (ZYPREXA) 15 MG disintegrating tablet Take 2 tablets (30 mg total) by mouth at bedtime. (Patient not taking: No sig reported) 60 tablet 0   PTA Medications: (Not in a hospital admission)   Musculoskeletal: Strength & Muscle Tone: within normal limits Gait & Station: normal Patient leans: N/A  Psychiatric Specialty Exam: Physical Exam Vitals and nursing note reviewed.  Constitutional:      Appearance: Normal appearance.  HENT:     Head: Normocephalic.     Nose: Nose normal.  Pulmonary:     Effort: Pulmonary effort is normal.  Musculoskeletal:        General: Normal range of motion.     Cervical back: Normal range of motion.  Neurological:     General: No focal deficit present.     Mental Status: He is alert and oriented to person, place, and time.  Psychiatric:        Attention and Perception: Attention and  perception normal.        Mood and Affect: Mood is anxious.        Speech: Speech normal.        Behavior: Behavior normal. Behavior is cooperative.        Thought Content: Thought content normal.        Cognition and Memory: Cognition and memory normal.        Judgment: Judgment normal.    Review of Systems  All other systems reviewed and  are negative.  Blood pressure 112/85, pulse 80, temperature 98.3 F (36.8 C), temperature source Oral, resp. rate 20, SpO2 99 %.There is no height or weight on file to calculate BMI.  General Appearance: Casual  Eye Contact:  Good  Speech:  Clear and Coherent and Normal Rate  Volume:  Normal  Mood:  Anxious  Affect:  Congruent  Thought Process:  Coherent and Descriptions of Associations: Intact  Orientation:  Full (Time, Place, and Person)  Thought Content:  WDL and Logical  Suicidal Thoughts:  No  Homicidal Thoughts:  No  Memory:  Immediate;   Good Recent;   Good Remote;   Good  Judgement:  Fair  Insight:  Fair  Psychomotor Activity:  Normal  Concentration:  Concentration: Fair and Attention Span: Fair  Recall:  Good  Fund of Knowledge:  Good  Language:  Good  Akathisia:  No  Handed:  Right  AIMS (if indicated):     Assets:  Housing Leisure Time Physical Health Resilience Social Support  ADL's:  WDL  Cognition:  WNL  Sleep:         Physical Exam: Physical Exam Vitals and nursing note reviewed.  Constitutional:      Appearance: Normal appearance.  HENT:     Head: Normocephalic.     Nose: Nose normal.  Pulmonary:     Effort: Pulmonary effort is normal.  Musculoskeletal:        General: Normal range of motion.     Cervical back: Normal range of motion.  Neurological:     General: No focal deficit present.     Mental Status: He is alert and oriented to person, place, and time.  Psychiatric:        Attention and Perception: Attention and perception normal.        Mood and Affect: Mood is anxious.        Speech: Speech  normal.        Behavior: Behavior normal. Behavior is cooperative.        Thought Content: Thought content normal.        Cognition and Memory: Cognition and memory normal.        Judgment: Judgment normal.   Review of Systems  All other systems reviewed and are negative. Blood pressure 112/85, pulse 80, temperature 98.3 F (36.8 C), temperature source Oral, resp. rate 20, SpO2 99 %. There is no height or weight on file to calculate BMI.   Demographic Factors:  Male and Living alone  Loss Factors: NA  Historical Factors: Impulsivity  Risk Reduction Factors:   Sense of responsibility to family, Positive social support, and Positive therapeutic relationship  Continued Clinical Symptoms:  Anxiety, mild; wants to leave  Cognitive Features That Contribute To Risk:  None    Suicide Risk:  Minimal: No identifiable suicidal ideation.  Patients presenting with no risk factors but with morbid ruminations; may be classified as minimal risk based on the severity of the depressive symptoms    Plan Of Care/Follow-up recommendations:    Schizoaffective disorder, bipolar type: -Continue Depakote 1000 mg BID -Continue Zyprexa 15 mg BID -Follow up with ACT team   EPS: -Continue Cogentin 1 mg BID Activity:  as tolerated Diet:  heart healthy diet  Disposition: discharge home awaiting return call from his guardian Nanine Means, NP 02/05/2021, 2:23 PM

## 2021-02-05 NOTE — BH Assessment (Signed)
TTS and York Cerise, NP met with patient for reassessment. Patient continues to be calm, cooperative and taking his medications without refusal. Psych team contacted patient's ACT Team, [facility] [phone] and spoke with Baxter Flattery. York Cerise, NP discussed patient's medication and informed Baxter Flattery that patient will be discharging to his home either today or tomm pending on conversation with guardian, patient's mom Advertising copywriter). Message was left on voicemail for guardian to return call. Awaiting response.

## 2021-02-13 ENCOUNTER — Encounter: Payer: Self-pay | Admitting: Emergency Medicine

## 2021-02-13 ENCOUNTER — Emergency Department: Payer: No Typology Code available for payment source

## 2021-02-13 ENCOUNTER — Other Ambulatory Visit: Payer: Self-pay

## 2021-02-13 ENCOUNTER — Emergency Department
Admission: EM | Admit: 2021-02-13 | Discharge: 2021-05-07 | Disposition: A | Discharge status: Home / Self Care | Payer: No Typology Code available for payment source | Attending: Emergency Medicine | Admitting: Emergency Medicine

## 2021-02-13 DIAGNOSIS — R4689 Other symptoms and signs involving appearance and behavior: Secondary | ICD-10-CM

## 2021-02-13 DIAGNOSIS — R456 Violent behavior: Secondary | ICD-10-CM | POA: Diagnosis present

## 2021-02-13 DIAGNOSIS — F250 Schizoaffective disorder, bipolar type: Secondary | ICD-10-CM | POA: Insufficient documentation

## 2021-02-13 DIAGNOSIS — F29 Unspecified psychosis not due to a substance or known physiological condition: Secondary | ICD-10-CM | POA: Insufficient documentation

## 2021-02-13 DIAGNOSIS — Y900 Blood alcohol level of less than 20 mg/100 ml: Secondary | ICD-10-CM | POA: Diagnosis not present

## 2021-02-13 DIAGNOSIS — Z79899 Other long term (current) drug therapy: Secondary | ICD-10-CM | POA: Insufficient documentation

## 2021-02-13 DIAGNOSIS — Z20822 Contact with and (suspected) exposure to covid-19: Secondary | ICD-10-CM | POA: Insufficient documentation

## 2021-02-13 DIAGNOSIS — F17210 Nicotine dependence, cigarettes, uncomplicated: Secondary | ICD-10-CM | POA: Insufficient documentation

## 2021-02-13 LAB — URINE DRUG SCREEN, QUALITATIVE (ARMC ONLY)
Amphetamines, Ur Screen: NOT DETECTED
Barbiturates, Ur Screen: NOT DETECTED
Benzodiazepine, Ur Scrn: NOT DETECTED
Cannabinoid 50 Ng, Ur ~~LOC~~: POSITIVE — AB
Cocaine Metabolite,Ur ~~LOC~~: NOT DETECTED
MDMA (Ecstasy)Ur Screen: NOT DETECTED
Methadone Scn, Ur: NOT DETECTED
Opiate, Ur Screen: NOT DETECTED
Phencyclidine (PCP) Ur S: NOT DETECTED
Tricyclic, Ur Screen: NOT DETECTED

## 2021-02-13 LAB — COMPREHENSIVE METABOLIC PANEL
ALT: 12 U/L (ref 0–44)
AST: 41 U/L (ref 15–41)
Albumin: 4.7 g/dL (ref 3.5–5.0)
Alkaline Phosphatase: 63 U/L (ref 38–126)
Anion gap: 8 (ref 5–15)
BUN: 16 mg/dL (ref 6–20)
CO2: 23 mmol/L (ref 22–32)
Calcium: 9.2 mg/dL (ref 8.9–10.3)
Chloride: 108 mmol/L (ref 98–111)
Creatinine, Ser: 1.55 mg/dL — ABNORMAL HIGH (ref 0.61–1.24)
GFR, Estimated: 59 mL/min — ABNORMAL LOW (ref >60)
Glucose, Bld: 97 mg/dL (ref 70–99)
Potassium: 3.5 mmol/L (ref 3.5–5.1)
Sodium: 139 mmol/L (ref 135–145)
Total Bilirubin: 1 mg/dL (ref 0.3–1.2)
Total Protein: 8.4 g/dL — ABNORMAL HIGH (ref 6.5–8.1)

## 2021-02-13 LAB — CBC
HCT: 41.2 % (ref 39.0–52.0)
Hemoglobin: 13.9 g/dL (ref 13.0–17.0)
MCH: 32.6 pg (ref 26.0–34.0)
MCHC: 33.7 g/dL (ref 30.0–36.0)
MCV: 96.7 fL (ref 80.0–100.0)
Platelets: 231 10*3/uL (ref 150–400)
RBC: 4.26 MIL/uL (ref 4.22–5.81)
RDW: 13.9 % (ref 11.5–15.5)
WBC: 6.2 10*3/uL (ref 4.0–10.5)
nRBC: 0 % (ref 0.0–0.2)

## 2021-02-13 LAB — SALICYLATE LEVEL: Salicylate Lvl: <7 mg/dL — ABNORMAL LOW (ref 7.0–30.0)

## 2021-02-13 LAB — RESP PANEL BY RT-PCR (FLU A&B, COVID) ARPGX2
Influenza A by PCR: NEGATIVE
Influenza B by PCR: NEGATIVE
SARS Coronavirus 2 by RT PCR: NEGATIVE

## 2021-02-13 LAB — ACETAMINOPHEN LEVEL: Acetaminophen (Tylenol), Serum: <10 ug/mL — ABNORMAL LOW (ref 10–30)

## 2021-02-13 LAB — ETHANOL: Alcohol, Ethyl (B): <10 mg/dL (ref <10)

## 2021-02-13 LAB — VALPROIC ACID LEVEL: Valproic Acid Lvl: <10 ug/mL — ABNORMAL LOW (ref 50.0–100.0)

## 2021-02-13 MED ORDER — OLANZAPINE 5 MG PO TBDP
15.0000 mg | ORAL_TABLET | Freq: Two times a day (BID) | ORAL | Status: DC
  Administered 2021-02-16 – 2021-02-19 (×4): 15 mg via ORAL
  Filled 2021-02-13 (×13): qty 1

## 2021-02-13 MED ORDER — DROPERIDOL 2.5 MG/ML IJ SOLN
10.0000 mg | Freq: Once | INTRAMUSCULAR | Status: AC
  Administered 2021-02-13: 10 mg via INTRAMUSCULAR

## 2021-02-13 MED ORDER — DIVALPROEX SODIUM 500 MG PO DR TAB
1000.0000 mg | DELAYED_RELEASE_TABLET | Freq: Two times a day (BID) | ORAL | Status: DC
  Administered 2021-02-16 – 2021-05-07 (×157): 1000 mg via ORAL
  Filled 2021-02-13 (×162): qty 2

## 2021-02-13 MED ORDER — DIPHENHYDRAMINE HCL 50 MG/ML IJ SOLN
50.0000 mg | Freq: Once | INTRAMUSCULAR | Status: AC
  Administered 2021-02-13: 50 mg via INTRAMUSCULAR
  Filled 2021-02-13: qty 1

## 2021-02-13 MED ORDER — LORAZEPAM 2 MG/ML IJ SOLN
2.0000 mg | Freq: Once | INTRAMUSCULAR | Status: AC
  Administered 2021-02-13: 2 mg via INTRAMUSCULAR
  Filled 2021-02-13: qty 1

## 2021-02-13 NOTE — ED Notes (Signed)
Patient is currently unsafe d/t being combative to travel to imaging. Imaging department aware of patient status.
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
## 2021-02-13 NOTE — ED Notes (Signed)
Taken to [employer] [facility] via wheelchair by RN and ODS escort. 1 belonging bag placed in storage area.

## 2021-02-13 NOTE — ED Notes (Signed)
Escorted to and from CT via wheelchair with this RN and ODS. No acute events.
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
## 2021-02-13 NOTE — ED Notes (Signed)
Patient has calmed down significantly. Awaiting assistance from officers for transport to imaging.
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
## 2021-02-13 NOTE — ED Notes (Signed)
Pt is cleaned up and dressed in wine colored scrubs by this NT, security officers and [employer] officers. Pt only had on a pair of blue and grey pants. Pt clothing item was placed in belongings bag.

## 2021-02-13 NOTE — ED Notes (Signed)
IVC, pend placement 

## 2021-02-13 NOTE — ED Notes (Signed)
Unable to obtain VS at this time due to patient sleeping. 

## 2021-02-13 NOTE — ED Notes (Signed)
Patient continues to be combative, hitting walls and trying to charge at the door. Dr. Larinda Buttery was made aware. Patient medicated as ordered.
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
## 2021-02-14 NOTE — ED Notes (Signed)
Pt has been in the bathroom for longer than normal.  Security officers checked on patient.  Pt is standing, staring off into space.  No self harm.  Pt's speech was unintelligible.

## 2021-02-14 NOTE — ED Notes (Signed)
IVC/Pending Placement 

## 2021-02-14 NOTE — ED Notes (Signed)
Pt refuses VS

## 2021-02-14 NOTE — ED Notes (Signed)
Patient refused his HS meds.

## 2021-02-14 NOTE — ED Notes (Signed)
RN has attempted to speak with patient through door due to patient aggression.  Pt's speech is low volume and difficult to understand.  Pt continues to pace.

## 2021-02-14 NOTE — ED Notes (Signed)
Pt refused scheduled medications. "I don't take that shit."

## 2021-02-14 NOTE — ED Notes (Addendum)
Pt banging on the window and demanding, "I need a fucking phone."   Pt is not coherent and would be unable to make a call at this time.

## 2021-02-14 NOTE — ED Notes (Signed)
Pt remains angry and agitated when speaking with staff.  Pt has no insight into why he was brought to the hospital and wants to be released.

## 2021-02-14 NOTE — ED Notes (Signed)
Pt's mother (and legal guardian) called to ask about vitiation hours.   She will be flying back from out of state and plans to visit in the next couple of days.   Dr. Toni Amend in agreement with the patient having a visit.

## 2021-02-14 NOTE — ED Notes (Signed)
IVC, pending placement 

## 2021-02-14 NOTE — BH Assessment (Signed)
Referral information completed and faxed to [facility]([phone]) and confirmed it was received. Patient currently pending review.

## 2021-02-14 NOTE — ED Notes (Signed)
Offered patient shower, patient state's what is that? Explained patient said NO.

## 2021-02-15 NOTE — ED Notes (Signed)
Patient refusing vitals and medications at this time.

## 2021-02-15 NOTE — ED Notes (Signed)
Patient refused vitals.

## 2021-02-15 NOTE — ED Notes (Signed)
Pt refuses all treatment including VS at this time.

## 2021-02-15 NOTE — ED Notes (Signed)
Pt calmer this afternoon, with only occasional yelling outbursts.  Continues to refuse meds and VS.

## 2021-02-15 NOTE — BH Assessment (Signed)
Spoke with [facility] ([phone]), patient on the wait list.

## 2021-02-15 NOTE — ED Notes (Signed)
IVC  [facility]  WAITLIST 

## 2021-02-15 NOTE — ED Notes (Signed)
Patient asked for apple juice, apple juice was given and patient tried to force open the door at this time.

## 2021-02-15 NOTE — ED Notes (Signed)
Pt pacing in hall, yelling and hitting walls.  Pt demanding food and becomes angry when told trays are not here yet.  Dr. Katrinka Blazing on unit to observe pt, will continue to monitor and update him on any needs.

## 2021-02-15 NOTE — ED Notes (Signed)
Gave pt dinner tray an juice. No other concerns at this time 

## 2021-02-15 NOTE — ED Notes (Signed)
Nurse tech and security x 2 to door to provide pt breakfast tray.  Pt asked to back to other side of room in order to safely open door, pt refuses to back away from door.  Pt encouraged multiple times to back away from door in order to provide him breakfast tray, pt continues to refuse to back away from door at this time.  Door not opened at this time.  Will attempt again later.

## 2021-02-15 NOTE — ED Provider Notes (Signed)
Emergency Medicine Observation Re-evaluation Note  Jackson [name] is a 35 y.o. male, seen on rounds today.  Pt initially presented to the ED for complaints of Aggressive Behavior Currently, the patient is pacing in his room.  Apparently he just finished repeatedly striking his door and yelling in his room, seemed to have calmed down over the past few minutes..  Physical Exam  BP 121/84   Pulse 84   Temp 98.1 F (36.7 C) (Oral)   Resp 18   Ht 6' (1.829 m)   Wt 113 kg   SpO2 100%   BMI 33.79 kg/m  Physical Exam Constitutional:      Appearance: He is not ill-appearing or toxic-appearing.  HENT:     Head: Atraumatic.  Cardiovascular:     Comments: Appears well perfused Pulmonary:     Effort: Pulmonary effort is normal.  Abdominal:     General: There is no distension.  Musculoskeletal:        General: No deformity.  Neurological:     General: No focal deficit present.     ED Course / MDM  EKG:   I have reviewed the labs performed to date as well as medications administered while in observation.  Recent changes in the last 24 hours include a few episodes of agitation and aggression requiring redirection.  Plan  Current plan is for inpatient psych. Patient is under full IVC at this time.   Delton Prairie, MD 02/15/21 [phone]

## 2021-02-16 DIAGNOSIS — F250 Schizoaffective disorder, bipolar type: Secondary | ICD-10-CM | POA: Diagnosis not present

## 2021-02-16 MED ORDER — MIDAZOLAM HCL 2 MG/2ML IJ SOLN
INTRAMUSCULAR | Status: AC
  Administered 2021-02-16: 2 mg via INTRAMUSCULAR
  Filled 2021-02-16: qty 2

## 2021-02-16 MED ORDER — ZIPRASIDONE MESYLATE 20 MG IM SOLR: 20.0000 mg | Freq: Once | INTRAMUSCULAR | Status: DC

## 2021-02-16 MED ORDER — DROPERIDOL 2.5 MG/ML IJ SOLN
5.0000 mg | Freq: Once | INTRAMUSCULAR | Status: AC
  Administered 2021-02-16: 5 mg via INTRAMUSCULAR
  Filled 2021-02-16 (×2): qty 2

## 2021-02-16 MED ORDER — MIDAZOLAM HCL 2 MG/2ML IJ SOLN: 2.0000 mg | Freq: Once | INTRAMUSCULAR | Status: AC
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
## 2021-02-16 NOTE — ED Notes (Signed)
IVC pending placement 
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
## 2021-02-16 NOTE — ED Notes (Signed)
Patient knocking on window, asked for soap, bar of soap given.  Patient dropped then kicked soap back under door.  Soap given back to patient who again put it on the floor and kicked it back under door.

## 2021-02-16 NOTE — ED Provider Notes (Signed)
Emergency Medicine Observation Re-evaluation Note  Jackson [name] is a 35 y.o. male, seen on rounds today.  Pt initially presented to the ED for complaints of Aggressive Behavior Currently, the patient is pacing and banging on the windows and doors and [facility].  Physical Exam  BP 121/84   Pulse 84   Temp 98.1 F (36.7 C) (Oral)   Resp 18   Ht 1.829 m (6')   Wt 113 kg   SpO2 100%   BMI 33.79 kg/m  Physical Exam General: Agitated but not in medical distress Cardiac: Good peripheral circulation Lungs: Breathing easily and comfortably without accessory muscle usage Psych: Agitated, violent behavior, not currently redirectable  ED Course / MDM  EKG:EKG Interpretation  Date/Time:  Wednesday February 13 2021 16:33:36 EDT Ventricular Rate:  76 PR Interval:  192 QRS Duration: 74 QT Interval:  354 QTC Calculation: 398 R Axis:   48 Text Interpretation: Normal sinus rhythm Junctional ST depression, probably normal Borderline ECG ----------unconfirmed---------- Confirmed by UNCONFIRMED, DOCTOR ([id number]), editor Tamsen Snider [phone]) on 02/15/2021 12:32:46 PM  I have reviewed the labs performed to date as well as medications administered while in observation.  Recent changes in the last 24 hours include occasional agitation requiring redirection. Plan  Current plan is for inpatient treatment [facility] at [facility].  Patient is currently agitated and resistant to redirection.  Requires calming agent, and reportedly Geodon does not work well on him.  I ordered droperidol 5 mg intramuscular and Versed 2 mg intramuscular.  Patient is under full IVC at this time.   Loleta Rose, MD 02/16/21 [phone]

## 2021-02-16 NOTE — ED Notes (Signed)
Patient continuing to bang on walls, yelling and cursing.  MD notified see orders.

## 2021-02-16 NOTE — ED Notes (Signed)
Patient easily aroused with verbal stimuli.  Patient denies any complaint at this time.

## 2021-02-16 NOTE — ED Notes (Signed)
Patient escorted back to room by security.
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
## 2021-02-16 NOTE — ED Notes (Signed)
Patient yelling and banging on window and door.
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
## 2021-02-16 NOTE — ED Notes (Addendum)
Patient continuing to bang on walls, yelling and cursing.  Britta Mccreedy, RN from inpatient behavior unit into talk with patient in attempt to de-escalate, but unsuccessful.

## 2021-02-17 NOTE — ED Notes (Signed)
Patient given sandwich tray per request 

## 2021-02-17 NOTE — ED Notes (Signed)
Breakfast tray provided. 

## 2021-02-17 NOTE — ED Notes (Signed)
Patient pacing up and down unit hallway occassionally yelling out.

## 2021-02-17 NOTE — ED Notes (Signed)
Lunch tray delivered.

## 2021-02-17 NOTE — ED Notes (Signed)
Patient knocking on door, when this RN to check on patient, patient states he did not get his dinner.  Attempted to explain that dinner was served prior to this RN getting to work and that kitchen was closed at this time and all staff had to offer was another [place] sandwich.  Patient became agitated and stated he wanted his dinner and that [place] sandwich was a snack.  Patient then banging on window of the door.

## 2021-02-17 NOTE — ED Provider Notes (Signed)
Emergency Medicine Observation Re-evaluation Note  Jackson [name] is a 35 y.o. male, seen on rounds today.  Pt initially presented to the ED for complaints of Aggressive Behavior Currently, the patient is standing still in his room, no longer pacing.  Physical Exam  BP (!) 139/91 (BP Location: Left Arm)   Pulse 66   Temp 97.9 F (36.6 C) (Oral)   Resp 18   Ht 6' (1.829 m)   Wt 113 kg   SpO2 100%   BMI 33.79 kg/m  Physical Exam Constitutional:      Appearance: He is not ill-appearing or toxic-appearing.  HENT:     Head: Atraumatic.  Cardiovascular:     Comments: Appears well perfused Pulmonary:     Effort: Pulmonary effort is normal.  Abdominal:     General: There is no distension.  Musculoskeletal:        General: No deformity.  Neurological:     General: No focal deficit present.     ED Course / MDM  EKG:EKG Interpretation  Date/Time:  Wednesday February 13 2021 16:33:36 EDT Ventricular Rate:  76 PR Interval:  192 QRS Duration: 74 QT Interval:  354 QTC Calculation: 398 R Axis:   48 Text Interpretation: Normal sinus rhythm Junctional ST depression, probably normal Borderline ECG ----------unconfirmed---------- Confirmed by UNCONFIRMED, DOCTOR ([id number]), editor Tamsen Snider [phone]) on 02/15/2021 12:32:46 PM  I have reviewed the labs performed to date as well as medications administered while in observation.  Recent changes in the last 24 hours include frequent redirection due to banging on doors and walls.  Plan  Current plan is for placement. Patient is under full IVC at this time.   Delton Prairie, MD 02/17/21 [phone]

## 2021-02-17 NOTE — BH Assessment (Signed)
Writer followed up with [facility] ([phone]) and patient remain on their wait list.

## 2021-02-17 NOTE — ED Notes (Signed)
Patient refuses vital signs at this time

## 2021-02-18 MED ORDER — LORAZEPAM 2 MG/ML IJ SOLN
2.0000 mg | INTRAMUSCULAR | Status: DC | PRN
  Administered 2021-02-18: 2 mg via INTRAMUSCULAR
  Filled 2021-02-18: qty 1

## 2021-02-18 MED ORDER — DIPHENHYDRAMINE HCL 50 MG/ML IJ SOLN
50.0000 mg | INTRAMUSCULAR | Status: DC | PRN
  Administered 2021-02-18: 50 mg via INTRAMUSCULAR
  Filled 2021-02-18: qty 1

## 2021-02-18 MED ORDER — HALOPERIDOL LACTATE 5 MG/ML IJ SOLN
5.0000 mg | INTRAMUSCULAR | Status: DC | PRN
  Administered 2021-02-18: 5 mg via INTRAMUSCULAR
  Filled 2021-02-18: qty 1

## 2021-02-18 MED ORDER — ZIPRASIDONE MESYLATE 20 MG IM SOLR
20.0000 mg | Freq: Once | INTRAMUSCULAR | Status: AC
  Administered 2021-02-18: 20 mg via INTRAMUSCULAR
  Filled 2021-02-18: qty 20

## 2021-02-18 NOTE — ED Notes (Signed)
Pt given IM injections with a 2nd RN and security present.  Pt took shots willingly.  No manual hold.

## 2021-02-18 NOTE — ED Provider Notes (Signed)
Emergency Medicine Observation Re-evaluation Note  Jackson [name] is a 35 y.o. male, seen on rounds today.  Pt initially presented to the ED for complaints of Aggressive Behavior  Currently, the patient is now resting in bed- had to be giving geodon IM due to acting out earlier this morning from pacing and yelling   Physical Exam  Blood pressure (!) 134/99, pulse 84, temperature 97.9 F (36.6 C), temperature source Oral, resp. rate 18, height 6' (1.829 m), weight 113 kg, SpO2 98 %.  Physical Exam General: No apparent distress HEENT: moist mucous membranes CV: RRR Pulm: Normal WOB GI: soft and non tender MSK: no edema or cyanosis Neuro: face symmetric, moving all extremities     ED Course / MDM     I have reviewed the labs performed to date as well as medications administered while in observation.  Recent changes in the last 24 hours include geodon given   Plan   Current plan is to continue to wait for psych plan/placement if felt warranted  Patient is under full IVC at this time.   Concha Se, MD 02/18/21 [phone]

## 2021-02-18 NOTE — ED Notes (Signed)
Medication will be given when back up security officers available.  Pt continues to yell, jump and bang on walls.

## 2021-02-18 NOTE — ED Notes (Addendum)
Pt's step dad wanting this RN to call with an update.  Pt is sleeping at this time and RN is unable to get consent to give an update  Gala Romney [phone])

## 2021-02-18 NOTE — ED Notes (Signed)
Pt awake and yelling from bed.

## 2021-02-18 NOTE — ED Notes (Signed)
Pt is escalating in behaviors.  RN to speak with Dr. Toni Amend about ordering IM medications.

## 2021-02-18 NOTE — ED Notes (Signed)
Patient resting quietly in room. No noted distress or abnormal behaviors noted. Will continue 15 minute checks and observation by security camera for safety. 

## 2021-02-18 NOTE — ED Notes (Signed)
Pt took injection willingly with security officers present.  No manual hold.

## 2021-02-18 NOTE — ED Notes (Signed)
Pt screaming loudly from bed.

## 2021-02-18 NOTE — ED Notes (Signed)
Resumed care from Jackson [name] b rn.  Pt sleeping in bed.

## 2021-02-18 NOTE — ED Notes (Signed)
IVC  [facility]  WAITLIST 

## 2021-02-18 NOTE — ED Notes (Signed)
Pt given a drink.

## 2021-02-18 NOTE — ED Notes (Signed)
IVC pending placement 

## 2021-02-18 NOTE — ED Notes (Signed)
Pt pacing around unit yelling.

## 2021-02-18 NOTE — ED Notes (Signed)
Pt can be heard yelling from his bed in nurse's station.

## 2021-02-18 NOTE — ED Notes (Signed)
Patient refused vital signs 

## 2021-02-18 NOTE — ED Notes (Addendum)
Pt jumping, air boxing and yelling.  Behaviors continuing to escalate.

## 2021-02-18 NOTE — ED Notes (Signed)
Pt continues to refuse VS

## 2021-02-19 DIAGNOSIS — F250 Schizoaffective disorder, bipolar type: Secondary | ICD-10-CM | POA: Diagnosis not present
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
## 2021-02-19 NOTE — ED Notes (Signed)
Pt lying in bed.  Pt awake, calm.

## 2021-02-19 NOTE — ED Notes (Signed)
Dinner tray delivered.

## 2021-02-19 NOTE — ED Notes (Addendum)
Pt refusing VS at this time. This tech informed pt that if he let me get VS that I would bring him an ice cream. Pt stated, "you can't bribe me with shit I get for free any ways." Amy, RN made aware.

## 2021-02-19 NOTE — ED Notes (Signed)
Hourly rounding reveals patient in room. No complaints, stable, in no acute distress. Q15 minute rounds and monitoring via Security Cameras to continue. 

## 2021-02-19 NOTE — ED Provider Notes (Signed)
Emergency Medicine Observation Re-evaluation Note  Jackson [name] is a 35 y.o. male, seen on rounds today.  Pt initially presented to the ED for complaints of Aggressive Behavior  Currently, the patient is now sleeping in bed without distress Physical Exam   Vitals:   02/18/21 2119 02/19/21 0537  BP: 93/76 (!) 147/90  Pulse: (!) 57 (!) 54  Resp: 20 18  Temp: 98.2 F (36.8 C) 97.7 F (36.5 C)  SpO2: 98% 100%     Physical Exam General: No apparent distress Pulm: Normal WOB MSK: no edema or cyanosis Neuro: Currently asleep, resting in bed without distress     ED Course / MDM     I have reviewed the labs performed to date as well as medications administered while in observation.  Recent changes in the last 24 hours include geodon given yesterday morning  Plan   Current plan is to continue to wait for psych plan/placement if felt warranted  Patient is under full IVC at this time.     Sharyn Creamer, MD 02/19/21 [phone]

## 2021-02-19 NOTE — ED Notes (Signed)
IVC/pending [facility] waitlist 

## 2021-02-19 NOTE — ED Notes (Signed)
Snack and beverage given. 

## 2021-02-19 NOTE — ED Notes (Signed)
Report off to lorrie rn  

## 2021-02-19 NOTE — ED Notes (Signed)
RN and tech in at obtain pt vitals at this time. When pt asking by RN to obtain BP, pt balled up both fists and violently shook his own body while laying on bed for a few seconds, then stated "nah, Im good, I know my BP". RN again asked pt if he was sure that he would not allow RN to obtain BP and pt refused a second time.

## 2021-02-19 NOTE — ED Notes (Signed)
Pt cursing this rn and giving her the finger, because his doors were lying on the floor, along with trash and this rn took the doors out until area could be cleaned by EVS.
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
## 2021-02-19 NOTE — ED Notes (Signed)
Report to include Situation, Background, Assessment, and Recommendations received from Amy RN. Patient alert and oriented, warm and dry, in no acute distress. Patient denies SI, HI, AVH and pain. Patient responding to internal stimuli, delusional thoughts. He appears to be in a good mood. Patient made aware of Q15 minute rounds and security cameras for their safety. Patient instructed to come to me with needs or concerns.

## 2021-02-20 MED ORDER — OLANZAPINE 10 MG PO TBDP
15.0000 mg | ORAL_TABLET | Freq: Two times a day (BID) | ORAL | Status: DC
  Administered 2021-02-20 – 2021-05-07 (×153): 15 mg via ORAL
  Filled 2021-02-20 (×4): qty 3
  Filled 2021-02-20: qty 1.5
  Filled 2021-02-20: qty 3
  Filled 2021-02-20: qty 1.5
  Filled 2021-02-20 (×4): qty 3
  Filled 2021-02-20: qty 1.5
  Filled 2021-02-20 (×13): qty 3
  Filled 2021-02-20: qty 1.5
  Filled 2021-02-20 (×13): qty 3
  Filled 2021-02-20 (×5): qty 1.5
  Filled 2021-02-20 (×11): qty 3
  Filled 2021-02-20: qty 1.5
  Filled 2021-02-20 (×11): qty 3
  Filled 2021-02-20: qty 1.5
  Filled 2021-02-20: qty 3
  Filled 2021-02-20: qty 1.5
  Filled 2021-02-20 (×2): qty 3
  Filled 2021-02-20: qty 1.5
  Filled 2021-02-20 (×27): qty 3
  Filled 2021-02-20: qty 1.5
  Filled 2021-02-20 (×3): qty 3
  Filled 2021-02-20: qty 1.5
  Filled 2021-02-20 (×5): qty 3
  Filled 2021-02-20: qty 1.5
  Filled 2021-02-20 (×2): qty 3
  Filled 2021-02-20: qty 1.5
  Filled 2021-02-20 (×11): qty 3
  Filled 2021-02-20 (×2): qty 1.5
  Filled 2021-02-20 (×3): qty 3
  Filled 2021-02-20: qty 1.5
  Filled 2021-02-20 (×3): qty 3
  Filled 2021-02-20: qty 1.5
  Filled 2021-02-20 (×13): qty 3
  Filled 2021-02-20: qty 1.5
  Filled 2021-02-20 (×2): qty 3
  Filled 2021-02-20: qty 1.5
  Filled 2021-02-20 (×5): qty 3
  Filled 2021-02-20 (×2): qty 1.5
  Filled 2021-02-20 (×2): qty 3
  Filled 2021-02-20: qty 1.5
  Filled 2021-02-20: qty 3
  Filled 2021-02-20: qty 1.5
  Filled 2021-02-20 (×2): qty 3
  Filled 2021-02-20: qty 1.5
  Filled 2021-02-20: qty 3
  Filled 2021-02-20: qty 1.5
  Filled 2021-02-20: qty 3
  Filled 2021-02-20: qty 1.5
  Filled 2021-02-20: qty 3
  Filled 2021-02-20: qty 1.5
  Filled 2021-02-20 (×3): qty 3
  Filled 2021-02-20 (×2): qty 1.5

## 2021-02-20 NOTE — ED Notes (Signed)
Hourly rounding performed, patient currently asleep in room. Patient has no complaints at this time. Q15 minute rounds and monitoring via Security Cameras to continue. 

## 2021-02-20 NOTE — ED Notes (Signed)
Hourly rounding reveals patient in room. No complaints, stable, in no acute distress. Q15 minute rounds and monitoring via Security Cameras to continue. 

## 2021-02-20 NOTE — ED Notes (Signed)
Report received from Ally, RN including SBAR. Patient alert and oriented, warm and dry, in no acute distress. Patient denies SI, HI, AVH and pain. Patient made aware of Q15 minute rounds and security cameras for their safety. Patient instructed to come to this nurse with needs or concerns. 

## 2021-02-20 NOTE — ED Notes (Signed)
Hourly rounding performed, patient currently awake in room. Patient has no complaints at this time. Q15 minute rounds and monitoring via Security Cameras to continue. 

## 2021-02-20 NOTE — ED Notes (Signed)
Pt given breakfast tray

## 2021-02-20 NOTE — ED Notes (Signed)
VS not taken, patient asleep 

## 2021-02-20 NOTE — ED Notes (Signed)
Defer VS at this time due to patient resting quietly. 

## 2021-02-20 NOTE — ED Notes (Signed)
Pt given shower supplies 

## 2021-02-20 NOTE — ED Notes (Signed)
Pt given meal tray.

## 2021-02-20 NOTE — ED Notes (Signed)
Called Renee BM @ 7:30 am to let her know that patient needed to have his IVC paperwork updated again by 3:15pm. She states she will let Dr. Toni Amend know.

## 2021-02-20 NOTE — ED Notes (Signed)
Ate 100% of breakfast

## 2021-02-21 NOTE — BH Assessment (Signed)
Spoke with [facility] ([phone]), and patient remain on their wait list.

## 2021-02-21 NOTE — ED Notes (Signed)
Hourly rounding performed, patient currently asleep in room. Patient has no complaints at this time. Q15 minute rounds and monitoring via Security Cameras to continue. 

## 2021-02-21 NOTE — ED Notes (Signed)
Patient refused vitals.

## 2021-02-21 NOTE — ED Notes (Signed)
Gave Pt another [place] tray with a soda.

## 2021-02-21 NOTE — ED Notes (Signed)
Pt sleeping, will try to get vitals when awake

## 2021-02-21 NOTE — ED Notes (Addendum)
Pt requesting to shower, shower supplies given at this time including clean clothes. Phone obtained from pt as well.

## 2021-02-21 NOTE — ED Provider Notes (Signed)
Emergency Medicine Observation Re-evaluation Note  Jackson [name] is a 35 y.o. male, seen on rounds today.  Pt initially presented to the ED for complaints of Aggressive Behavior Currently, the patient is resting.  Physical Exam  BP 127/79   Pulse 90   Temp 98.2 F (36.8 C) (Oral)   Resp 17   Ht 6' (1.829 m)   Wt 113 kg   SpO2 98%   BMI 33.79 kg/m  Physical Exam General: nad Cardiac: well perfused Lungs: unlabored resp Psych: currently calm  ED Course / MDM  EKG:EKG Interpretation  Date/Time:  Wednesday February 13 2021 16:33:36 EDT Ventricular Rate:  76 PR Interval:  192 QRS Duration: 74 QT Interval:  354 QTC Calculation: 398 R Axis:   48 Text Interpretation: Normal sinus rhythm Junctional ST depression, probably normal Borderline ECG ----------unconfirmed---------- Confirmed by UNCONFIRMED, DOCTOR ([id number]), editor Tamsen Snider [phone]) on 02/15/2021 12:32:46 PM  I have reviewed the labs performed to date as well as medications administered while in observation.  Recent changes in the last 24 hours include none.  Plan  Current plan is for psych eval. Jackson [name] is under involuntary commitment.      Willy Eddy, MD 02/21/21 [phone]

## 2021-02-21 NOTE — ED Notes (Signed)
Pt given dinner tray and juice at this time. 

## 2021-02-21 NOTE — ED Notes (Signed)
Able to obtain VS from pt except temp.

## 2021-02-21 NOTE — ED Notes (Signed)
Pt asleep at this time, unable to collect vitals. Will collect pt vitals once awake. 

## 2021-02-21 NOTE — ED Notes (Signed)
IVC/ On [name] Wait List

## 2021-02-22 NOTE — BH Assessment (Signed)
Writer contacted [facility] and spoke with Delice Bison [phone]) who reports patient remains on wait list

## 2021-02-22 NOTE — ED Provider Notes (Signed)
Emergency Medicine Observation Re-evaluation Note  Jackson [name] is a 35 y.o. male, seen on rounds today.  Pt initially presented to the ED for complaints of Aggressive Behavior  Currently, the patient is calm, no acute complaints.  Physical Exam  Blood pressure 115/77, pulse 77, temperature 98.2 F (36.8 C), temperature source Oral, resp. rate 18, height 6' (1.829 m), weight 113 kg, SpO2 100 %. Physical Exam General: NAD Lungs: CTAB Psych: not agitated  ED Course / MDM  EKG:    I have reviewed the labs performed to date as well as medications administered while in observation.  Recent changes in the last 24 hours include no acute events overnight.    Plan  Current plan is for psych disposition. Patient is under full IVC at this time.   Sharman Cheek, MD 02/22/21 [phone]

## 2021-02-22 NOTE — ED Notes (Signed)
Pt given meal tray.

## 2021-02-22 NOTE — ED Notes (Signed)
Breakfast tray given at this time.  

## 2021-02-22 NOTE — ED Notes (Signed)
IVC on [facility] waiting list

## 2021-02-22 NOTE — ED Notes (Signed)
Pt states he's running hot water to warm up bathroom. Pt given body wash per his request.

## 2021-02-22 NOTE — ED Notes (Signed)
Pt given dinner box. Room cleaned of empty meal boxes.

## 2021-02-23 NOTE — ED Provider Notes (Signed)
Emergency Medicine Observation Re-evaluation Note  Jackson [name] is a 35 y.o. male, seen on rounds today.  Pt initially presented to the ED for complaints of Aggressive Behavior Currently, the patient is resting calmly.  Physical Exam  BP 131/63 (BP Location: Left Arm)   Pulse 86   Temp 98.4 F (36.9 C) (Oral)   Resp 20   Ht 6' (1.829 m)   Wt 113 kg   SpO2 97%   BMI 33.79 kg/m  Physical Exam General: no acute distress Lungs: equal chest rise Psych: calm  ED Course / MDM  EKG:EKG Interpretation  Date/Time:  Wednesday February 13 2021 16:33:36 EDT Ventricular Rate:  76 PR Interval:  192 QRS Duration: 74 QT Interval:  354 QTC Calculation: 398 R Axis:   48 Text Interpretation: Normal sinus rhythm Junctional ST depression, probably normal Borderline ECG ----------unconfirmed---------- Confirmed by UNCONFIRMED, DOCTOR ([id number]), editor Tamsen Snider [phone]) on 02/15/2021 12:32:46 PM  I have reviewed the labs performed to date as well as medications administered while in observation.  Recent changes in the last 24 hours include none.  Plan  Current plan is for inpatient. Jackson [name] is under involuntary commitment.      Gilles Chiquito, MD 02/23/21 [phone]

## 2021-02-23 NOTE — ED Notes (Signed)
IVC, pending [facility] waitlist 

## 2021-02-23 NOTE — ED Notes (Signed)
Lunch tray given. 

## 2021-02-23 NOTE — ED Notes (Signed)
This tech went into pt rm to get phone back form pt. I informed pt that I was going to bring the trash can in so that pt could clean his rm. Pt stated, "call someone else in here to clean it then." Amy, RN made aware of what pt stated.

## 2021-02-23 NOTE — ED Notes (Signed)
Pt given dinner tray an cup of sprite

## 2021-02-23 NOTE — BH Assessment (Signed)
Writer contacted [facility] and spoke with ([phone]), patient remains on wait list.

## 2021-02-24 NOTE — ED Notes (Signed)
Pt given dinner tray.

## 2021-02-24 NOTE — ED Provider Notes (Signed)
Emergency Medicine Observation Re-evaluation Note  Jackson [name] is a 35 y.o. male, seen on rounds today.  Pt initially presented to the ED for complaints of Aggressive Behavior Currently, the patient is calm, no acute complaints.  Physical Exam  BP 104/71 (BP Location: Right Arm)   Pulse 70   Temp 98.5 F (36.9 C) (Oral)   Resp 18   Ht 6' (1.829 m)   Wt 113 kg   SpO2 99%   BMI 33.79 kg/m  Physical Exam General: no acute distress Lungs: equal chest rise Psych: cam  ED Course / MDM  EKG:EKG Interpretation  Date/Time:  Wednesday February 13 2021 16:33:36 EDT Ventricular Rate:  76 PR Interval:  192 QRS Duration: 74 QT Interval:  354 QTC Calculation: 398 R Axis:   48 Text Interpretation: Normal sinus rhythm Junctional ST depression, probably normal Borderline ECG ----------unconfirmed---------- Confirmed by UNCONFIRMED, DOCTOR ([id number]), editor Tamsen Snider [phone]) on 02/15/2021 12:32:46 PM  I have reviewed the labs performed to date as well as medications administered while in observation.  Recent changes in the last 24 hours include none.  Plan  Current plan is for inpatient. Jackson [name] is under involuntary commitment.      Gilles Chiquito, MD 02/24/21 [phone]

## 2021-02-24 NOTE — BH Assessment (Signed)
Writer contacted [name] and spoke with Elita Quick [phone]) who reports patient remains on wait list

## 2021-02-24 NOTE — ED Notes (Signed)
IVC pending placement 

## 2021-02-25 NOTE — ED Provider Notes (Signed)
Emergency Medicine Observation Re-evaluation Note  Jackson [name] is a 35 y.o. male, seen on rounds today.  Pt initially presented to the ED for complaints of Aggressive Behavior Currently, the patient is calm, resting, no acute complaints.  Physical Exam  BP 106/72   Pulse 79   Temp 98.2 F (36.8 C) (Oral)   Resp 17   Ht 6' (1.829 m)   Wt 113 kg   SpO2 99%   BMI 33.79 kg/m  Physical Exam General: no acute distress, sleeping in room Lungs: equal chest rise Psych: cam  ED Course / MDM   I have reviewed the labs performed to date as well as medications administered while in observation.  Recent changes in the last 24 hours include none.  Plan  Current plan is for inpatient. Jackson [name] is under involuntary commitment.     Sharyn Creamer, MD 02/25/21 1046

## 2021-02-25 NOTE — ED Notes (Signed)
Hourly rounding reveals patient in room. No complaints, stable, in no acute distress. Q15 minute rounds and monitoring via Security Cameras to continue. 

## 2021-02-25 NOTE — ED Notes (Signed)
Report to include Situation, Background, Assessment, and Recommendations received from [facility]. Patient alert and oriented, warm and dry, in no acute distress. Patient denies SI, HI, AVH and pain. Patient appears to responding to internal stimuli. Patient made aware of Q15 minute rounds and security cameras for their safety. Patient instructed to come to me with needs or concerns.

## 2021-02-25 NOTE — ED Notes (Signed)
IVC pending placement 

## 2021-02-25 NOTE — ED Notes (Signed)
IVC, pending placement 

## 2021-02-25 NOTE — ED Notes (Signed)
Pt awoke to eat breakfast, then immediately turned out his room lights and went back to bed.  Medications and VS deferred until pt is awake.

## 2021-02-26 LAB — COMPREHENSIVE METABOLIC PANEL
ALT: 9 U/L (ref 0–44)
AST: 23 U/L (ref 15–41)
Albumin: 3.2 g/dL — ABNORMAL LOW (ref 3.5–5.0)
Alkaline Phosphatase: 42 U/L (ref 38–126)
Anion gap: 9 (ref 5–15)
BUN: 11 mg/dL (ref 6–20)
CO2: 28 mmol/L (ref 22–32)
Calcium: 8.7 mg/dL — ABNORMAL LOW (ref 8.9–10.3)
Chloride: 103 mmol/L (ref 98–111)
Creatinine, Ser: 1.29 mg/dL — ABNORMAL HIGH (ref 0.61–1.24)
GFR, Estimated: >60 mL/min (ref >60)
Glucose, Bld: 84 mg/dL (ref 70–99)
Potassium: 4.3 mmol/L (ref 3.5–5.1)
Sodium: 140 mmol/L (ref 135–145)
Total Bilirubin: 0.6 mg/dL (ref 0.3–1.2)
Total Protein: 6.2 g/dL — ABNORMAL LOW (ref 6.5–8.1)

## 2021-02-26 NOTE — ED Notes (Signed)
Hourly rounding reveals patient in room. No complaints, stable, in no acute distress. Q15 minute rounds and monitoring via Security Cameras to continue. 
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
## 2021-02-26 NOTE — ED Notes (Signed)
IVC ON  [facility]  WAITLIST

## 2021-02-26 NOTE — ED Notes (Signed)
VS not taken, Patient asleep. 

## 2021-02-26 NOTE — ED Notes (Signed)
Per mother, patient has court date on august 15th of this month.

## 2021-02-26 NOTE — ED Provider Notes (Signed)
Emergency Medicine Observation Re-evaluation Note  Jackson [name] is a 35 y.o. male, seen on rounds today.  Pt initially presented to the ED for complaints of Aggressive Behavior Currently, the patient is sleeping comfortably.  Physical Exam  BP 106/76 (BP Location: Right Arm)   Pulse 74   Temp 97.9 F (36.6 C) (Oral)   Resp 16   Ht 6' (1.829 m)   Wt 113 kg   SpO2 98%   BMI 33.79 kg/m  Physical Exam General: Sleeping. Cardiac: Good peripheral perfusion. Lungs: Normal respiratory effort. Psych: Calm.  ED Course / MDM    I have reviewed the labs performed to date as well as medications administered while in observation.  There have been no significant recent changes in his status in the last 24 hours.  Plan  Current plan is for inpatient placement. Jackson [name] is under involuntary commitment.      Dionne Bucy, MD 02/26/21 [phone]

## 2021-02-26 NOTE — ED Notes (Signed)
Pt given snack and apple juice. °

## 2021-02-27 NOTE — ED Provider Notes (Signed)
Emergency Medicine Observation Re-evaluation Note  Zuhair Kinnick is a 35 y.o. male, seen on rounds today.  Pt initially presented to the ED for complaints of Aggressive Behavior Currently, the patient is sleeping.  Physical Exam  BP 123/82 (BP Location: Left Arm)   Pulse 79   Temp 97.9 F (36.6 C) (Oral)   Resp 18   Ht 6' (1.829 m)   Wt 113 kg   SpO2 99%   BMI 33.79 kg/m  Physical Exam General: nad Cardiac: well perfused Lungs: unlabored Psych: currently calm  ED Course / MDM  EKG:EKG Interpretation  Date/Time:  Wednesday February 13 2021 16:33:36 EDT Ventricular Rate:  76 PR Interval:  192 QRS Duration: 74 QT Interval:  354 QTC Calculation: 398 R Axis:   48 Text Interpretation: Normal sinus rhythm Junctional ST depression, probably normal Borderline ECG ----------unconfirmed---------- Confirmed by UNCONFIRMED, DOCTOR ([id number]), editor Tamsen Snider [phone]) on 02/15/2021 12:32:46 PM  I have reviewed the labs performed to date as well as medications administered while in observation.  Recent changes in the last 24 hours include none.  Plan  Current plan is for psych eval and management. Mattia Frediani is under involuntary commitment.      Willy Eddy, MD 02/27/21 [phone]

## 2021-02-27 NOTE — ED Notes (Signed)
NT and RN picked up patient's trash and cleaned floor.

## 2021-02-27 NOTE — ED Notes (Signed)
Patient resting quietly in room. No noted distress or abnormal behaviors noted. Will continue 15 minute checks and observation by security camera for safety. 

## 2021-02-28 NOTE — ED Provider Notes (Signed)
Emergency Medicine Observation Re-evaluation Note  Ildefonso Kloster is a 35 y.o. male, seen on rounds today.  Pt initially presented to the ED for complaints of Aggressive Behavior Currently, the patient is calm, resting.  Physical Exam  BP (!) 91/56   Pulse 67   Temp 98.3 F (36.8 C) (Oral)   Resp 18   Ht 6' (1.829 m)   Wt 113 kg   SpO2 98%   BMI 33.79 kg/m  Physical Exam General: nad Cardiac: well perfused Lungs: normal wob Psych: calm, resting  ED Course / MDM  EKG:EKG Interpretation  Date/Time:  Wednesday February 13 2021 16:33:36 EDT Ventricular Rate:  76 PR Interval:  192 QRS Duration: 74 QT Interval:  354 QTC Calculation: 398 R Axis:   48 Text Interpretation: Normal sinus rhythm Junctional ST depression, probably normal Borderline ECG ----------unconfirmed---------- Confirmed by UNCONFIRMED, DOCTOR ([id number]), editor Tamsen Snider [phone]) on 02/15/2021 12:32:46 PM  I have reviewed the labs performed to date as well as medications administered while in observation.  Recent changes in the last 24 hours include none.  Plan  Current plan is for IVC, seeking placement. Khristopher Brion is under involuntary commitment.      [name], MD 02/28/21 [phone]

## 2021-02-28 NOTE — ED Notes (Signed)
IVC  ON  [facility]  WAITLIST 

## 2021-02-28 NOTE — BH Assessment (Signed)
Writer contacted [facility] and spoke with ([phone]), patient remains on wait list.

## 2021-03-01 DIAGNOSIS — F250 Schizoaffective disorder, bipolar type: Secondary | ICD-10-CM | POA: Diagnosis not present

## 2021-03-01 NOTE — ED Notes (Signed)
IVC on [facility] waitlist

## 2021-03-01 NOTE — ED Notes (Signed)
Hourly rounding reveals patient in room. No complaints, stable, in no acute distress. Q15 minute rounds and monitoring via Security Cameras to continue. 

## 2021-03-01 NOTE — ED Provider Notes (Signed)
Emergency Medicine Observation Re-evaluation Note  Jackson [name] is a 35 y.o. male, seen on rounds today.  Physical Exam  BP 110/70   Pulse 79   Temp 98.4 F (36.9 C) (Oral)   Resp 18   Ht 6' (1.829 m)   Wt 113 kg   SpO2 98%   BMI 33.79 kg/m  Physical Exam General: Patient resting comfortably in bed Lungs: Patient in no respiratory distress Psych: Patient is not agitated  ED Course / MDM  EKG:EKG Interpretation  Date/Time:  Wednesday February 13 2021 16:33:36 EDT Ventricular Rate:  76 PR Interval:  192 QRS Duration: 74 QT Interval:  354 QTC Calculation: 398 R Axis:   48 Text Interpretation: Normal sinus rhythm Junctional ST depression, probably normal Borderline ECG ----------unconfirmed---------- Confirmed by UNCONFIRMED, DOCTOR ([id number]), editor Tamsen Snider [phone]) on 02/15/2021 12:32:46 PM   Plan  Current plan is for plan is for placement. Jackson [name] is under involuntary commitment.      Arnaldo Natal, MD 03/01/21 [phone]

## 2021-03-01 NOTE — BH Assessment (Signed)
Writer contacted [facility] and spoke with ([phone]), patient remains on wait list. 
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
## 2021-03-01 NOTE — ED Notes (Signed)
Report given to Hewan RN 

## 2021-03-01 NOTE — ED Notes (Signed)
Snack and beverage given. 

## 2021-03-01 NOTE — ED Notes (Signed)
Report received from Garner Gavel. RN. Patient is resting on bed.

## 2021-03-01 NOTE — Consult Note (Signed)
[facility] Face-to-Face Psychiatry Consult   Reason for Consult: Follow-up 35 year old man bipolar disorder in the hospital with recurrent mania Referring Physician: Quale Patient Identification: Jackson [name] MRN:  [id number] Principal Diagnosis: Schizoaffective disorder, bipolar type (HCC) Diagnosis:  Principal Problem:   Schizoaffective disorder, bipolar type (HCC)   Total Time spent with patient: 20 minutes  Subjective:   Jackson [name] is a 35 y.o. male patient admitted with "I'm good, when can I go home".  HPI:  35 yo male with schizoaffective disorder waiting for [facility] placement.  He seems to be improving with less agitation.  His area is a mess and he continues to stay in bed watching television and eating.  He stated his sleep was "restless" last night and focuses on discharge.  Unfortunately, he stops taking his medications at home and decompensates, sending him back to the hospital with aggression and psychosis.  He is still delusional at times and grandiose.  Past Psychiatric History: 1 of several admissions back to back for this patient who has a problem with noncompliance and recurrent behavior issues outside the hospital  Risk to Self:  none Risk to Others:  yes Prior Inpatient Therapy:  multiple Prior Outpatient Therapy:  ACt team  Past Medical History:  Past Medical History:  Diagnosis Date   Bipolar 1 disorder (HCC)    Schizoaffective disorder (HCC)    History reviewed. No pertinent surgical history. Family History: History reviewed. No pertinent family history. Family Psychiatric  History: See previous Social History:  Social History   Substance and Sexual Activity  Alcohol Use Not Currently     Social History   Substance and Sexual Activity  Drug Use Not Currently    Social History   Socioeconomic History   Marital status: Single    Spouse name: Not on file   Number of children: Not on file   Years of education: Not on file   Highest education level:  Not on file  Occupational History   Not on file  Tobacco Use   Smoking status: Light Smoker    Packs/day: 0.25    Types: Cigarettes   Smokeless tobacco: Never  Vaping Use   Vaping Use: Never used  Substance and Sexual Activity   Alcohol use: Not Currently   Drug use: Not Currently   Sexual activity: Not on file  Other Topics Concern   Not on file  Social History Narrative   Not on file   Social Determinants of Health   Financial Resource Strain: Not on file  Food Insecurity: Not on file  Transportation Needs: Not on file  Physical Activity: Not on file  Stress: Not on file  Social Connections: Not on file   Additional Social History:    Allergies:  No Known Allergies  Labs: No results found for this or any previous visit (from the past 48 hour(s)).  Current Facility-Administered Medications  Medication Dose Route Frequency Provider Last Rate Last Admin   diphenhydrAMINE (BENADRYL) injection 50 mg  50 mg Intramuscular Q4H PRN [name], [name], MD   50 mg at 02/18/21 1453   divalproex (DEPAKOTE) DR tablet 1,000 mg  1,000 mg Oral Q12H [name], [name], MD   1,000 mg at 03/01/21 1024   haloperidol lactate (HALDOL) injection 5 mg  5 mg Intramuscular Q4H PRN [name], [name], MD   5 mg at 02/18/21 1453   LORazepam (ATIVAN) injection 2 mg  2 mg Intramuscular Q4H PRN [name], [name], MD   2 mg at 02/18/21  1454   OLANZapine zydis (ZYPREXA) disintegrating tablet 15 mg  15 mg Oral BID Foye Deer, RPH   15 mg at 03/01/21 1024   Current Outpatient Medications  Medication Sig Dispense Refill   ARIPiprazole (ABILIFY) 15 MG tablet Take 1 tablet (15 mg total) by mouth daily for 8 days. 8 tablet 0   ARIPiprazole ER (ABILIFY MAINTENA) 400 MG SRER injection Inject 2 mLs (400 mg total) into the muscle every 28 (twenty-eight) days. (Patient not taking: Reported on 02/14/2021) 1 each    chlorproMAZINE (THORAZINE) 50 MG tablet Take 5 tablets (250 mg total) by mouth 3 (three) times daily.  (Patient not taking: Reported on 02/14/2021) 90 tablet 0   divalproex (DEPAKOTE) 500 MG DR tablet Take 2 tablets (1,000 mg total) by mouth every 12 (twelve) hours. (Patient not taking: Reported on 02/14/2021) 120 tablet 1   OLANZapine zydis (ZYPREXA) 15 MG disintegrating tablet Take 2 tablets (30 mg total) by mouth at bedtime. (Patient not taking: Reported on 02/14/2021) 60 tablet 0    Musculoskeletal: Strength & Muscle Tone: within normal limits Gait & Station: normal Patient leans: N/A  Psychiatric Specialty Exam: Physical Exam Vitals and nursing note reviewed.  Constitutional:      Appearance: Normal appearance.  HENT:     Head: Normocephalic and atraumatic.     Mouth/Throat:     Pharynx: Oropharynx is clear.  Eyes:     Pupils: Pupils are equal, round, and reactive to light.  Cardiovascular:     Rate and Rhythm: Normal rate and regular rhythm.  Pulmonary:     Effort: Pulmonary effort is normal.     Breath sounds: Normal breath sounds.  Abdominal:     General: Abdomen is flat.     Palpations: Abdomen is soft.  Musculoskeletal:        General: Normal range of motion.  Skin:    General: Skin is warm and dry.  Neurological:     General: No focal deficit present.     Mental Status: He is alert. Mental status is at baseline.  Psychiatric:        Attention and Perception: He is inattentive.        Mood and Affect: Mood normal. Affect is labile and inappropriate.        Speech: Speech is tangential.        Behavior: Behavior is agitated.        Thought Content: Thought content is paranoid.        Cognition and Memory: Cognition is impaired.        Judgment: Judgment is impulsive.    Review of Systems  Constitutional: Negative.   HENT: Negative.    Eyes: Negative.   Respiratory: Negative.    Cardiovascular: Negative.   Gastrointestinal: Negative.   Musculoskeletal: Negative.   Skin: Negative.   Neurological: Negative.   Psychiatric/Behavioral:  Negative for depression,  hallucinations, memory loss, substance abuse and suicidal ideas. The patient is not nervous/anxious and does not have insomnia.    Blood pressure 122/78, pulse 82, temperature 98.2 F (36.8 C), temperature source Oral, resp. rate 18, height 6' (1.829 m), weight 113 kg, SpO2 98 %.Body mass index is 33.79 kg/m.  General Appearance: Casual  Eye Contact:  Fair  Speech:  Normal Rate  Volume:  Normal  Mood:  Depressed  Affect:  Blunt  Thought Process:  Descriptions of Associations: Tangential  Orientation:  Full (Time, Place, and Person)  Thought Content:  Delusions and Paranoid Ideation  Suicidal Thoughts:  No  Homicidal Thoughts:  No  Memory:  Immediate;   Fair Recent;   Poor Remote;   Fair  Judgement:  Poor  Insight:  Lacking  Psychomotor Activity:  Decreased  Concentration:  Concentration: Fair and Attention Span: Fair  Recall:  Fair  Fund of Knowledge:  Good  Language:  Good  Akathisia:  No  Handed:  Right  AIMS (if indicated):     Assets:  Housing Leisure Time Physical Health Resilience Social Support  ADL's:  Intact  Cognition:  WNL  Sleep:        Physical Exam: Physical Exam Vitals and nursing note reviewed.  Constitutional:      Appearance: Normal appearance.  HENT:     Head: Normocephalic and atraumatic.     Mouth/Throat:     Pharynx: Oropharynx is clear.  Eyes:     Pupils: Pupils are equal, round, and reactive to light.  Cardiovascular:     Rate and Rhythm: Normal rate and regular rhythm.  Pulmonary:     Effort: Pulmonary effort is normal.     Breath sounds: Normal breath sounds.  Abdominal:     General: Abdomen is flat.     Palpations: Abdomen is soft.  Musculoskeletal:        General: Normal range of motion.  Skin:    General: Skin is warm and dry.  Neurological:     General: No focal deficit present.     Mental Status: He is alert. Mental status is at baseline.  Psychiatric:        Attention and Perception: He is inattentive.        Mood and  Affect: Mood normal. Affect is labile and inappropriate.        Speech: Speech is tangential.        Behavior: Behavior is agitated.        Thought Content: Thought content is paranoid.        Cognition and Memory: Cognition is impaired.        Judgment: Judgment is impulsive.   Review of Systems  Constitutional: Negative.   HENT: Negative.    Eyes: Negative.   Respiratory: Negative.    Cardiovascular: Negative.   Gastrointestinal: Negative.   Musculoskeletal: Negative.   Skin: Negative.   Neurological: Negative.   Psychiatric/Behavioral:  Negative for depression, hallucinations, memory loss, substance abuse and suicidal ideas. The patient is not nervous/anxious and does not have insomnia.   Blood pressure 122/78, pulse 82, temperature 98.2 F (36.8 C), temperature source Oral, resp. rate 18, height 6' (1.829 m), weight 113 kg, SpO2 98 %. Body mass index is 33.79 kg/m.  Treatment Plan Summary: Per Dr. Toni Amend:  Medication management and Plan continue orders for oral medication.  Continue encouraging patient to be compliant.  He is on the [facility] wait list and on the back area of the locked behavioral health unit of the emergency room because of his history of violence and agitation towards other patients and staff. Schizoaffective disorder, bipolar type: -Continue Depakote 1000 mg BID -Continue Zyprexa 15 mg BID   Agitation medications: -Haldol 5 mg, Ativan 2 mg, and 50 mg of Benadryl IM every 4 hours PRN  Disposition: Recommend psychiatric Inpatient admission when medically cleared.  Nanine Means, NP 03/01/2021 12:36 PM

## 2021-03-01 NOTE — ED Notes (Signed)
Report to include Situation, Background, Assessment, and Recommendations received from [facility]. Patient alert and oriented, warm and dry, in no acute distress. Patient denies SI, HI, AVH and pain. Patient appears to be having AVH. Patient made aware of Q15 minute rounds and security cameras for their safety. Patient instructed to come to me with needs or concerns.

## 2021-03-02 DIAGNOSIS — F250 Schizoaffective disorder, bipolar type: Secondary | ICD-10-CM | POA: Diagnosis not present

## 2021-03-02 NOTE — ED Notes (Signed)
Hourly rounding reveals patient in room. No complaints, stable, in no acute distress. Q15 minute rounds and monitoring via Security Cameras to continue. 

## 2021-03-02 NOTE — ED Notes (Signed)
Lunch provided.

## 2021-03-02 NOTE — ED Notes (Signed)
Received night time snack at this time. 

## 2021-03-02 NOTE — ED Provider Notes (Signed)
Emergency Medicine Observation Re-evaluation Note  Jackson [name] is a 35 y.o. male, seen on rounds today.  Pt initially presented to the ED for complaints of Aggressive Behavior Currently, the patient is watching TV in common area  Physical Exam  BP (!) 88/51 (BP Location: Right Arm)   Pulse 69   Temp 97.8 F (36.6 C) (Oral)   Resp 20   Ht 1.829 m (6')   Wt 113 kg   SpO2 98%   BMI 33.79 kg/m  Physical Exam General: Calm  Lungs: No increased work of breathing Psych: No agitation or aggressive behavior  ED Course / MDM  No acute events overnight  Plan  Current plan is for placement per behavioral team. Jackson [name] is under involuntary commitment.      Jene Every, MD 03/02/21 [phone]

## 2021-03-02 NOTE — Consult Note (Signed)
[facility] Face-to-Face Psychiatry Consult   Reason for Consult: Follow-up 35 year old man bipolar disorder in the hospital with recurrent mania Referring Physician: Quale Patient Identification: Jackson [name] MRN:  [id number] Principal Diagnosis: Schizoaffective disorder, bipolar type (HCC) Diagnosis:  Principal Problem:   Schizoaffective disorder, bipolar type (HCC)   Total Time spent with patient: 20 minutes  Subjective:   Jackson [name] is a 35 y.o. male patient admitted with psychosis and mania after not taking his medications.  HPI:  35 yo male with schizoaffective disorder waiting for [facility] placement.  He has slept most of the day.  When he is awake, he is polite and plans to shower this afternoon.  He is delusional stating someone was trying to break into his place and it ended with him being here.  Requests double portions at meals which is granted.  Past Psychiatric History: 1 of several admissions back to back for this patient who has a problem with noncompliance and recurrent behavior issues outside the hospital  Risk to Self:  none Risk to Others:  yes Prior Inpatient Therapy:  multiple Prior Outpatient Therapy:  ACt team  Past Medical History:  Past Medical History:  Diagnosis Date   Bipolar 1 disorder (HCC)    Schizoaffective disorder (HCC)    History reviewed. No pertinent surgical history. Family History: History reviewed. No pertinent family history. Family Psychiatric  History: See previous Social History:  Social History   Substance and Sexual Activity  Alcohol Use Not Currently     Social History   Substance and Sexual Activity  Drug Use Not Currently    Social History   Socioeconomic History   Marital status: Single    Spouse name: Not on file   Number of children: Not on file   Years of education: Not on file   Highest education level: Not on file  Occupational History   Not on file  Tobacco Use   Smoking status: Light Smoker    Packs/day:  0.25    Types: Cigarettes   Smokeless tobacco: Never  Vaping Use   Vaping Use: Never used  Substance and Sexual Activity   Alcohol use: Not Currently   Drug use: Not Currently   Sexual activity: Not on file  Other Topics Concern   Not on file  Social History Narrative   Not on file   Social Determinants of Health   Financial Resource Strain: Not on file  Food Insecurity: Not on file  Transportation Needs: Not on file  Physical Activity: Not on file  Stress: Not on file  Social Connections: Not on file   Additional Social History:    Allergies:  No Known Allergies  Labs: No results found for this or any previous visit (from the past 48 hour(s)).  Current Facility-Administered Medications  Medication Dose Route Frequency Provider Last Rate Last Admin   diphenhydrAMINE (BENADRYL) injection 50 mg  50 mg Intramuscular Q4H PRN [name], [name], MD   50 mg at 02/18/21 1453   divalproex (DEPAKOTE) DR tablet 1,000 mg  1,000 mg Oral Q12H [name], [name], MD   1,000 mg at 03/02/21 1011   haloperidol lactate (HALDOL) injection 5 mg  5 mg Intramuscular Q4H PRN [name], [name], MD   5 mg at 02/18/21 1453   LORazepam (ATIVAN) injection 2 mg  2 mg Intramuscular Q4H PRN [name], [name], MD   2 mg at 02/18/21 1454   OLANZapine zydis (ZYPREXA) disintegrating tablet 15 mg  15 mg Oral BID Clovia Cuff  G, RPH   15 mg at 03/02/21 1011   Current Outpatient Medications  Medication Sig Dispense Refill   ARIPiprazole (ABILIFY) 15 MG tablet Take 1 tablet (15 mg total) by mouth daily for 8 days. 8 tablet 0   ARIPiprazole ER (ABILIFY MAINTENA) 400 MG SRER injection Inject 2 mLs (400 mg total) into the muscle every 28 (twenty-eight) days. (Patient not taking: Reported on 02/14/2021) 1 each    chlorproMAZINE (THORAZINE) 50 MG tablet Take 5 tablets (250 mg total) by mouth 3 (three) times daily. (Patient not taking: Reported on 02/14/2021) 90 tablet 0   divalproex (DEPAKOTE) 500 MG DR tablet Take 2 tablets  (1,000 mg total) by mouth every 12 (twelve) hours. (Patient not taking: Reported on 02/14/2021) 120 tablet 1   OLANZapine zydis (ZYPREXA) 15 MG disintegrating tablet Take 2 tablets (30 mg total) by mouth at bedtime. (Patient not taking: Reported on 02/14/2021) 60 tablet 0    Musculoskeletal: Strength & Muscle Tone: within normal limits Gait & Station: normal Patient leans: N/A  Psychiatric Specialty Exam: Physical Exam Vitals and nursing note reviewed.  Constitutional:      Appearance: Normal appearance.  HENT:     Head: Normocephalic and atraumatic.     Mouth/Throat:     Pharynx: Oropharynx is clear.  Eyes:     Pupils: Pupils are equal, round, and reactive to light.  Cardiovascular:     Rate and Rhythm: Normal rate and regular rhythm.  Pulmonary:     Effort: Pulmonary effort is normal.     Breath sounds: Normal breath sounds.  Abdominal:     General: Abdomen is flat.     Palpations: Abdomen is soft.  Musculoskeletal:        General: Normal range of motion.  Skin:    General: Skin is warm and dry.  Neurological:     General: No focal deficit present.     Mental Status: He is alert. Mental status is at baseline.  Psychiatric:        Attention and Perception: Attention normal.        Mood and Affect: Mood normal. Affect is blunt.        Speech: Speech normal.        Behavior: Behavior normal. Behavior is cooperative.        Thought Content: Thought content is delusional.        Cognition and Memory: Cognition normal.        Judgment: Judgment is impulsive.    Review of Systems  Constitutional: Negative.   HENT: Negative.    Eyes: Negative.   Respiratory: Negative.    Cardiovascular: Negative.   Gastrointestinal: Negative.   Musculoskeletal: Negative.   Skin: Negative.   Neurological: Negative.   Psychiatric/Behavioral:  Negative for depression, hallucinations, memory loss, substance abuse and suicidal ideas. The patient is not nervous/anxious and does not have  insomnia.    Blood pressure (!) 88/51, pulse 69, temperature 97.8 F (36.6 C), temperature source Oral, resp. rate 20, height 6' (1.829 m), weight 113 kg, SpO2 98 %.Body mass index is 33.79 kg/m.  General Appearance: Casual  Eye Contact:  Fair  Speech:  Normal Rate  Volume:  Normal  Mood:  Depressed  Affect:  Blunt  Thought Process:  Descriptions of Associations: Tangential  Orientation:  Full (Time, Place, and Person)  Thought Content:  Delusions and Paranoid Ideation  Suicidal Thoughts:  No  Homicidal Thoughts:  No  Memory:  Immediate;   Fair Recent;  Poor Remote;   Fair  Judgement:  Poor  Insight:  Lacking  Psychomotor Activity:  Decreased  Concentration:  Concentration: Fair and Attention Span: Fair  Recall:  Fair  Fund of Knowledge:  Good  Language:  Good  Akathisia:  No  Handed:  Right  AIMS (if indicated):     Assets:  Housing Leisure Time Physical Health Resilience Social Support  ADL's:  Intact  Cognition:  WNL  Sleep:        Physical Exam: Physical Exam Vitals and nursing note reviewed.  Constitutional:      Appearance: Normal appearance.  HENT:     Head: Normocephalic and atraumatic.     Mouth/Throat:     Pharynx: Oropharynx is clear.  Eyes:     Pupils: Pupils are equal, round, and reactive to light.  Cardiovascular:     Rate and Rhythm: Normal rate and regular rhythm.  Pulmonary:     Effort: Pulmonary effort is normal.     Breath sounds: Normal breath sounds.  Abdominal:     General: Abdomen is flat.     Palpations: Abdomen is soft.  Musculoskeletal:        General: Normal range of motion.  Skin:    General: Skin is warm and dry.  Neurological:     General: No focal deficit present.     Mental Status: He is alert. Mental status is at baseline.  Psychiatric:        Attention and Perception: Attention normal.        Mood and Affect: Mood normal. Affect is blunt.        Speech: Speech normal.        Behavior: Behavior normal. Behavior is  cooperative.        Thought Content: Thought content is delusional.        Cognition and Memory: Cognition normal.        Judgment: Judgment is impulsive.   Review of Systems  Constitutional: Negative.   HENT: Negative.    Eyes: Negative.   Respiratory: Negative.    Cardiovascular: Negative.   Gastrointestinal: Negative.   Musculoskeletal: Negative.   Skin: Negative.   Neurological: Negative.   Psychiatric/Behavioral:  Negative for depression, hallucinations, memory loss, substance abuse and suicidal ideas. The patient is not nervous/anxious and does not have insomnia.   Blood pressure (!) 88/51, pulse 69, temperature 97.8 F (36.6 C), temperature source Oral, resp. rate 20, height 6' (1.829 m), weight 113 kg, SpO2 98 %. Body mass index is 33.79 kg/m.  Treatment Plan Summary: Per Dr. Toni Amend:  Medication management and Plan continue orders for oral medication.  Continue encouraging patient to be compliant.  He is on the [facility] wait list and on the back area of the locked behavioral health unit of the emergency room because of his history of violence and agitation towards other patients and staff. Schizoaffective disorder, bipolar type: -Continue Depakote 1000 mg BID -Continue Zyprexa 15 mg BID   Agitation medications: -Haldol 5 mg, Ativan 2 mg, and 50 mg of Benadryl IM every 4 hours PRN  Disposition: Recommend psychiatric Inpatient admission when medically cleared.  Nanine Means, NP 03/02/2021 10:58 AM

## 2021-03-02 NOTE — ED Notes (Signed)
VS not taken, patient asleep 

## 2021-03-02 NOTE — ED Notes (Signed)
Patient s;leeping all day, up when awakened for lunch. Patient asked to clean his room patient was compliant with request. Patient calm and cooperative.

## 2021-03-02 NOTE — ED Notes (Signed)
Patient took scheduled meds this morning. Shower offered patient declined.

## 2021-03-02 NOTE — ED Notes (Signed)
Dinner tray given double food. Lunch tray was given but it was a issue with his lunch not having double portions on it. Had to call dinning service about 4x until they sent the other portions for his lunch. Dinner was right.

## 2021-03-02 NOTE — BH Assessment (Signed)
Writer contacted [facility] and spoke with ([phone]), patient remains on wait list.

## 2021-03-02 NOTE — ED Notes (Signed)
Given breakfast tray. 

## 2021-03-02 NOTE — ED Notes (Signed)
Pt given organ juice and apple juice and ice cream

## 2021-03-02 NOTE — ED Notes (Signed)
Patient provided [employer] clothes and oral hygiene  supplies. Patient showered and went to bed/.

## 2021-03-03 DIAGNOSIS — F250 Schizoaffective disorder, bipolar type: Secondary | ICD-10-CM | POA: Diagnosis not present
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
## 2021-03-03 NOTE — ED Notes (Signed)
Patient out of bed knocking on window requesting 2 ice creams.

## 2021-03-03 NOTE — ED Notes (Signed)
IVC pending placement 

## 2021-03-03 NOTE — ED Notes (Signed)
Pt in restroom. Unable to obtain vitals at this time.

## 2021-03-03 NOTE — ED Notes (Signed)
Patient up requesting clean clothes and shower supplies. Supplies provided, patients bed linen changed.

## 2021-03-03 NOTE — ED Provider Notes (Signed)
Emergency Medicine Observation Re-evaluation Note  Sulayman Krolikowski is a 35 y.o. male, seen on rounds today.  Pt initially presented to the ED for complaints of Aggressive Behavior Currently, the patient is laying in bed, denies complaints.  Physical Exam  BP 117/80 (BP Location: Left Arm)   Pulse 90   Temp 98.4 F (36.9 C) (Oral)   Resp 18   Ht 6' (1.829 m)   Wt 113 kg   SpO2 98%   BMI 33.79 kg/m  Physical Exam Constitutional: Resting comfortably. Eyes: Conjunctivae are normal. Head: Atraumatic. Nose: No congestion/rhinnorhea. Mouth/Throat: Mucous membranes are moist. Neck: Normal ROM Cardiovascular: No cyanosis noted. Respiratory: Normal respiratory effort. Gastrointestinal: Non-distended. Genitourinary: deferred Musculoskeletal: No lower extremity tenderness nor edema. Neurologic:  Normal speech and language. No gross focal neurologic deficits are appreciated. Skin:  Skin is warm, dry and intact. No rash noted.   ED Course / MDM  EKG:EKG Interpretation  Date/Time:  Wednesday February 13 2021 16:33:36 EDT Ventricular Rate:  76 PR Interval:  192 QRS Duration: 74 QT Interval:  354 QTC Calculation: 398 R Axis:   48 Text Interpretation: Normal sinus rhythm Junctional ST depression, probably normal Borderline ECG ----------unconfirmed---------- Confirmed by UNCONFIRMED, DOCTOR ([id number]), editor Tamsen Snider [phone]) on 02/15/2021 12:32:46 PM  I have reviewed the labs performed to date as well as medications administered while in observation.  Recent changes in the last 24 hours include none.  Plan  Current plan is for psychiatric admission. Seydina Kohli is under involuntary commitment.      Chesley Noon, MD 03/03/21 [phone]

## 2021-03-04 NOTE — ED Provider Notes (Signed)
Emergency Medicine Observation Re-evaluation Note  Jackson [name] is a 35 y.o. male, seen on rounds today.  Pt initially presented to the ED for complaints of Aggressive Behavior   Physical Exam  BP 106/78   Pulse 78   Temp 98 F (36.7 C) (Oral)   Resp 16   Ht 1.829 m (6')   Wt 113 kg   SpO2 98%   BMI 33.79 kg/m  Physical Exam General: No distress, no complaints overnight  Psych: Relatively cooperative, no aggressive behavior  ED Course / MDM  EKG:  Plan  Current plan is for placement, unchanged from prior Adric Servidio is under involuntary commitment.      Jene Every, MD 03/04/21 [phone]

## 2021-03-04 NOTE — ED Notes (Signed)
Report received from Amy RN. Patient care assumed. Patient/RN introduction complete. Will continue to monitor.  

## 2021-03-04 NOTE — ED Notes (Signed)
IVC/Pending Placement 

## 2021-03-04 NOTE — ED Notes (Signed)
Pt taking shower at this time  

## 2021-03-04 NOTE — ED Notes (Signed)
Pt given juice and extra blanket.

## 2021-03-04 NOTE — ED Notes (Signed)
Pt calm and cooperative at this time, no co pain or discomfort. Pt given paper states has crayons In room and wants to write. Pt given snacks.

## 2021-03-04 NOTE — ED Notes (Signed)
Patient observed with no unusual behavior or acute distress. Patient with no verbalized needs or c/o at this time.... will continue to monitor and follow up as needed. Security staff monitoring patient on camera system.  

## 2021-03-05 NOTE — ED Notes (Signed)
Patient resting comfortably in room. No complaints or concerns voiced. No distress or abnormal behavior noted. Will continue to monitor with security cameras. Q 15 minute rounds continue. 

## 2021-03-05 NOTE — ED Notes (Signed)
Pt calm and cooperative at this time, requesting to take shower. Clean scrubs and towels provided.

## 2021-03-05 NOTE — ED Notes (Signed)
IVC pending placement 
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
## 2021-03-05 NOTE — ED Provider Notes (Signed)
Emergency Medicine Observation Re-evaluation Note  Jackson [name] is a 35 y.o. male, seen on rounds today.  Pt initially presented to the ED for complaints of Aggressive Behavior Currently, the patient is taking a shower, denies complaints.  Physical Exam  BP 101/63   Pulse 75   Temp 98.1 F (36.7 C) (Oral)   Resp 18   Ht 6' (1.829 m)   Wt 113 kg   SpO2 98%   BMI 33.79 kg/m  Physical Exam Constitutional: Resting comfortably. Eyes: Conjunctivae are normal. Head: Atraumatic. Nose: No congestion/rhinnorhea. Mouth/Throat: Mucous membranes are moist. Neck: Normal ROM Cardiovascular: No cyanosis noted. Respiratory: Normal respiratory effort. Gastrointestinal: Non-distended. Genitourinary: deferred Musculoskeletal: No lower extremity tenderness nor edema. Neurologic:  Normal speech and language. No gross focal neurologic deficits are appreciated. Skin:  Skin is warm, dry and intact. No rash noted.   ED Course / MDM  EKG:EKG Interpretation  Date/Time:  Wednesday February 13 2021 16:33:36 EDT Ventricular Rate:  76 PR Interval:  192 QRS Duration: 74 QT Interval:  354 QTC Calculation: 398 R Axis:   48 Text Interpretation: Normal sinus rhythm Junctional ST depression, probably normal Borderline ECG ----------unconfirmed---------- Confirmed by UNCONFIRMED, DOCTOR ([id number]), editor Tamsen Snider [phone]) on 02/15/2021 12:32:46 PM  I have reviewed the labs performed to date as well as medications administered while in observation.  Recent changes in the last 24 hours include none.  Plan  Current plan is for psychiatric admission. Jackson [name] is under involuntary commitment.      Chesley Noon, MD 03/05/21 [phone]

## 2021-03-05 NOTE — ED Notes (Signed)
Report received from Amy RN. Patient care assumed. Patient/RN introduction complete. Will continue to monitor.  

## 2021-03-05 NOTE — ED Notes (Signed)
[facility] pending placement to be renewed 8/3 1500

## 2021-03-05 NOTE — ED Notes (Signed)
Pt eating snack.

## 2021-03-05 NOTE — ED Notes (Signed)
Pt's father called and asked ER secretary if he could send patient something for his [birth year] on [birth year]. RN explained that would have to be a decision made by the director of the ED.

## 2021-03-06 NOTE — ED Notes (Signed)
Patient resting comfortably in room. No complaints or concerns voiced. No distress or abnormal behavior noted. Will continue to monitor with security cameras. Q 15 minute rounds continue. 

## 2021-03-06 NOTE — BH Assessment (Signed)
Writer contacted [facility] and spoke with ([phone]), patient remains on wait list.

## 2021-03-06 NOTE — ED Provider Notes (Signed)
Emergency Medicine Observation Re-evaluation Note  Jackson [name] is a 35 y.o. male, seen on rounds today.  Pt initially presented to the ED for complaints of Aggressive Behavior Currently, the patient is resting comfortably.  Physical Exam  BP 131/66 (BP Location: Right Arm)   Pulse 78   Temp 98.3 F (36.8 C) (Oral)   Resp 17   Ht 6' (1.829 m)   Wt 113 kg   SpO2 98%   BMI 33.79 kg/m  Physical Exam Constitutional:      Appearance: He is not ill-appearing or toxic-appearing.  HENT:     Head: Atraumatic.  Cardiovascular:     Comments: Appears well perfused Pulmonary:     Effort: Pulmonary effort is normal.  Abdominal:     General: There is no distension.  Musculoskeletal:        General: No deformity.  Neurological:     General: No focal deficit present.     ED Course / MDM  EKG:EKG Interpretation  Date/Time:  Wednesday February 13 2021 16:33:36 EDT Ventricular Rate:  76 PR Interval:  192 QRS Duration: 74 QT Interval:  354 QTC Calculation: 398 R Axis:   48 Text Interpretation: Normal sinus rhythm Junctional ST depression, probably normal Borderline ECG ----------unconfirmed---------- Confirmed by UNCONFIRMED, DOCTOR ([id number]), editor Tamsen Snider [phone]) on 02/15/2021 12:32:46 PM  I have reviewed the labs performed to date as well as medications administered while in observation.  Recent changes in the last 24 hours include none.  Plan  Current plan is for placement. Jackson [name] is under involuntary commitment.      Delton Prairie, MD 03/06/21 [phone]

## 2021-03-06 NOTE — ED Notes (Signed)
Report received from Jessica, RN including SBAR. Patient alert and oriented, warm and dry, in no acute distress. Patient denies SI, HI, AVH and pain. Patient made aware of Q15 minute rounds and security cameras for their safety. Patient instructed to come to this nurse with needs or concerns. 

## 2021-03-06 NOTE — BH Assessment (Signed)
Writer contacted [facility] and spoke with ([phone]), patient remains on wait list.

## 2021-03-06 NOTE — ED Notes (Signed)
Pt had shower

## 2021-03-06 NOTE — ED Notes (Signed)
OK'd per Tammy Sours, ED Director for pt to receive mail in the form of a card/letter/book that will be opened in front of staff for pt's birthday [birth year]. Able to also receive special meal/dessert from dietary.

## 2021-03-06 NOTE — ED Notes (Signed)
Spoke with mother and stepfather. Informed that pt has been evicted from his home and that he has a pending court date August 15th. Pt is aware that he has been evicted and that parents are getting his stuff for him.

## 2021-03-06 NOTE — ED Notes (Signed)
Pt provided with snack and drink of choice at this time 

## 2021-03-07 NOTE — ED Notes (Signed)
IVC/Pending Placement 

## 2021-03-07 NOTE — ED Notes (Signed)
Pt eating dinner tray °

## 2021-03-07 NOTE — ED Notes (Signed)
Pt asleep at this time, unable to collect vitals. Will collect pt vitals once awake. 

## 2021-03-07 NOTE — ED Provider Notes (Signed)
Emergency Medicine Observation Re-evaluation Note  Jackson [name] is a 35 y.o. male, seen on rounds today.  Pt initially presented to the ED for complaints of Aggressive Behavior Currently, the patient is resting comfortably.  Physical Exam  BP 118/70 (BP Location: Left Arm)   Pulse 87   Temp 98.1 F (36.7 C)   Resp 17   Ht 6' (1.829 m)   Wt 113 kg   SpO2 99%   BMI 33.79 kg/m  Physical Exam General: No acute distress Cardiac: Well-perfused extremities Lungs: No respiratory distress Psych: Appropriate mood and affect  ED Course / MDM  EKG:EKG Interpretation  Date/Time:  Wednesday February 13 2021 16:33:36 EDT Ventricular Rate:  76 PR Interval:  192 QRS Duration: 74 QT Interval:  354 QTC Calculation: 398 R Axis:   48 Text Interpretation: Normal sinus rhythm Junctional ST depression, probably normal Borderline ECG ----------unconfirmed---------- Confirmed by UNCONFIRMED, DOCTOR ([id number]), editor Tamsen Snider [phone]) on 02/15/2021 12:32:46 PM  I have reviewed the labs performed to date as well as medications administered while in observation.  Recent changes in the last 24 hours include none.  Plan  Current plan is for placement. Jackson [name] is under involuntary commitment.      Merwyn Katos, MD 03/07/21 [phone]

## 2021-03-07 NOTE — ED Notes (Signed)
IVC, pending placement 

## 2021-03-07 NOTE — BH Assessment (Signed)
Writer contacted [facility] and spoke with ([phone]), patient remains on wait list.

## 2021-03-07 NOTE — ED Notes (Signed)
Resumed care from annie rn.  Pt lying in bed.

## 2021-03-07 NOTE — ED Notes (Signed)
Pt given breakfast tray and juice at this time. 

## 2021-03-08 DIAGNOSIS — F250 Schizoaffective disorder, bipolar type: Secondary | ICD-10-CM | POA: Diagnosis not present

## 2021-03-08 NOTE — ED Notes (Signed)
[facility]/ Pending Placement/ On [facility] Wait list

## 2021-03-08 NOTE — ED Notes (Signed)
Pt provided with snack and drink of choice at this time. Educated on food rules of [facility]

## 2021-03-08 NOTE — ED Notes (Signed)
Pt. Asleep at this time. Will try again for vitals later.

## 2021-03-08 NOTE — ED Notes (Signed)
Pt has gotten into shower after nurse leaves area following assessment due to water not being turned off on unit. Will turn off water after complete as pt reports he did not receive shower during day. Provided with clean scrubs.

## 2021-03-08 NOTE — ED Notes (Signed)
Pt given meal tray at this time 

## 2021-03-08 NOTE — ED Notes (Signed)
Report received from [facility]. Patient alert and oriented, warm and dry, in no acute distress. Patient denies SI, HI, AVH and pain. Patient made aware of Q15 minute rounds and security cameras for their safety. Patient instructed to come to this nurse with needs or concerns.

## 2021-03-08 NOTE — ED Provider Notes (Signed)
Emergency Medicine Observation Re-evaluation Note  Ronda Talamante is a 35 y.o. male, seen on rounds today.  Pt initially presented to the ED for complaints of Aggressive Behavior Currently, the patient is watching TV  Physical Exam  BP 134/81   Pulse 68   Temp 97.8 F (36.6 C) (Oral)   Resp 18   Ht 1.829 m (6')   Wt 113 kg   SpO2 98%   BMI 33.79 kg/m  Physical Exam General: No aggressive behavior, no events overnight  Lungs: Normal respiratory effort Psych: No aggressive behavior  ED Course / MDM    Plan  Current plan is for placement as per social work/behavioral team. Landry Mellow Poke is under involuntary commitment.      Jene Every, MD 03/08/21 [phone]
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
## 2021-03-09 DIAGNOSIS — F250 Schizoaffective disorder, bipolar type: Secondary | ICD-10-CM | POA: Diagnosis not present

## 2021-03-09 NOTE — ED Notes (Signed)
Pt given ice cream and apple juice.

## 2021-03-09 NOTE — ED Notes (Signed)
Pt out of shower.

## 2021-03-09 NOTE — ED Provider Notes (Addendum)
Emergency Medicine Observation Re-evaluation Note  Jackson [name] is a 35 y.o. male, seen on rounds today.  Pt initially presented to the ED for complaints of Aggressive Behavior Currently, the patient is resting comfortably.  Physical Exam  BP 110/67 (BP Location: Left Arm)   Pulse 87   Temp 98.2 F (36.8 C) (Oral)   Resp 18   Ht 6' (1.829 m)   Wt 113 kg   SpO2 96%   BMI 33.79 kg/m  Physical Exam General: no acute distress  Lungs: no increased wob Psych: no aggression at this time  ED Course / MDM  EKG:EKG Interpretation  Date/Time:  Wednesday February 13 2021 16:33:36 EDT Ventricular Rate:  76 PR Interval:  192 QRS Duration: 74 QT Interval:  354 QTC Calculation: 398 R Axis:   48 Text Interpretation: Normal sinus rhythm Junctional ST depression, probably normal Borderline ECG ----------unconfirmed---------- Confirmed by UNCONFIRMED, DOCTOR ([id number]), editor Tamsen Snider [phone]) on 02/15/2021 12:32:46 PM  I have reviewed the labs performed to date as well as medications administered while in observation.  Recent changes in the last 24 hours include no clinical change.  Plan  Current plan is for psych inpatient placement. Abdinasir Fern is under involuntary commitment.      Georga Hacking, MD 03/09/21 2707    Georga Hacking, MD 03/09/21 [phone]

## 2021-03-09 NOTE — ED Notes (Signed)
EVS in pt rm cleaning floors, Security April at bedside.

## 2021-03-09 NOTE — BH Assessment (Signed)
Writer contacted [facility] and spoke with ([phone]), patient remains on wait list 

## 2021-03-09 NOTE — ED Notes (Signed)
Pt given shower supplies, in shower at this time. °

## 2021-03-09 NOTE — ED Notes (Signed)
Pt asleep at this time, unable to collect vitals. Will collect pt vitals once awake. 

## 2021-03-09 NOTE — ED Notes (Signed)
Dinner-hot meal given to pt. 

## 2021-03-09 NOTE — ED Notes (Addendum)
Pts rm cleaned at this time with security, April at bedside . EVS called to sweep and mop floors d/t it being sticky.

## 2021-03-09 NOTE — ED Notes (Signed)
[facility]/Pending Placement/On [facility] waitlist

## 2021-03-09 NOTE — ED Notes (Signed)
Lunch-hot meal and drink given to pt. 

## 2021-03-09 NOTE — ED Notes (Signed)
Breakfast tray given to pt with drink.

## 2021-03-10 NOTE — BH Assessment (Signed)
Writer contacted [facility] and spoke with ([phone]), patient remains on wait list. 

## 2021-03-10 NOTE — ED Notes (Signed)
Dinner given

## 2021-03-10 NOTE — BH Assessment (Signed)
Writer contacted [facility] and spoke with ([phone]), patient remains on wait list

## 2021-03-10 NOTE — ED Provider Notes (Signed)
Emergency Medicine Observation Re-evaluation Note  Jackson [name] is a 35 y.o. male, seen on rounds today.   Physical Exam  BP 104/72 (BP Location: Right Arm)   Pulse 78   Temp 98.2 F (36.8 C) (Oral)   Resp 18   Ht 6' (1.829 m)   Wt 113 kg   SpO2 99%   BMI 33.79 kg/m  Physical Exam General: Patient currently resting comfortably Lungs: Patient in no respiratory distress Psych: Patient not agitated  ED Course / MDM  EKG:  Plan  Current plan is for dispo by psychiatry Anton Bara is under involuntary commitment.      Arnaldo Natal, MD 03/10/21 [phone]

## 2021-03-11 DIAGNOSIS — F250 Schizoaffective disorder, bipolar type: Secondary | ICD-10-CM | POA: Diagnosis not present

## 2021-03-11 NOTE — ED Notes (Signed)
Asked and patient took shower

## 2021-03-11 NOTE — ED Provider Notes (Signed)
Emergency Medicine Observation Re-evaluation Note  Jackson [name] is a 35 y.o. male, seen on rounds today.  Pt initially presented to the ED for complaints of Aggressive Behavior Currently, the patient is resting calmly.  Physical Exam  BP 112/76 (BP Location: Right Arm)   Pulse 73   Temp 98.6 F (37 C) (Oral)   Resp 18   Ht 6' (1.829 m)   Wt 113 kg   SpO2 100%   BMI 33.79 kg/m  Physical Exam General: no acute distress Lungs: equal chest rise Psych: calm  ED Course / MDM  EKG:EKG Interpretation  Date/Time:  Wednesday February 13 2021 16:33:36 EDT Ventricular Rate:  76 PR Interval:  192 QRS Duration: 74 QT Interval:  354 QTC Calculation: 398 R Axis:   48 Text Interpretation: Normal sinus rhythm Junctional ST depression, probably normal Borderline ECG ----------unconfirmed---------- Confirmed by UNCONFIRMED, DOCTOR ([id number]), editor Tamsen Snider [phone]) on 02/15/2021 12:32:46 PM  I have reviewed the labs performed to date as well as medications administered while in observation.  Recent changes in the last 24 hours include none.  Plan  Current plan is for inpatient. Jackson [name] is under involuntary commitment.      Gilles Chiquito, MD 03/11/21 [phone]

## 2021-03-11 NOTE — ED Notes (Signed)
[facility]/pending placement on [facility] waitlist

## 2021-03-11 NOTE — ED Notes (Signed)
Patient had shower 
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
## 2021-03-11 NOTE — ED Notes (Signed)
[facility] Wait List

## 2021-03-11 NOTE — BH Assessment (Addendum)
Writer contacted [facility] and spoke with ([phone]), patient remains on wait list. 

## 2021-03-12 NOTE — ED Notes (Signed)
Pt. Alert and oriented, warm and dry, in no distress. Pt. Denies SI, HI, and AVH. Pt. Encouraged to let nursing staff know of any concerns or needs. 

## 2021-03-12 NOTE — ED Notes (Addendum)
Spoon was given to pt and returned fully intact to be thrown out when pt was done with it.

## 2021-03-12 NOTE — ED Notes (Addendum)
Pt given lunch tray. Spoon given to pt. Pt will return spoon when he is finished eating.

## 2021-03-12 NOTE — ED Notes (Addendum)
Pt sitting in dayroom at this time d/t rm is being cleaned by EVS.

## 2021-03-12 NOTE — ED Notes (Signed)
Gave supper tray with sprite.

## 2021-03-12 NOTE — ED Notes (Signed)
Pt not ready for a shower at this time, will inform tech or RN when he is ready.

## 2021-03-12 NOTE — ED Notes (Addendum)
Pt calm and cooperative at this time, ambulatory to bathroom.

## 2021-03-12 NOTE — ED Notes (Signed)
Care handoff not completed. Was documented in error. Meant to be documented on different pt.

## 2021-03-12 NOTE — ED Notes (Signed)
Care handoff was documented on wrong pt.

## 2021-03-12 NOTE — ED Notes (Signed)
Pt returned lunch tray and spoon to trash.

## 2021-03-12 NOTE — ED Notes (Signed)
Gave extra food tray with tea. 

## 2021-03-12 NOTE — ED Notes (Signed)
IVC  [facility]  WAITLIST 

## 2021-03-12 NOTE — ED Notes (Signed)
Breakfast tray provided. Pt awake, walked to bathroom with steady gait. Pt appears calm and cooperative at this time.

## 2021-03-13 NOTE — ED Notes (Signed)
Cleaned toothpaste off of camera in room #1.

## 2021-03-13 NOTE — ED Notes (Signed)
[facility] Re-newed/ Still on [facility] waitlist

## 2021-03-13 NOTE — ED Provider Notes (Signed)
Emergency Medicine Observation Re-evaluation Note  Jackson [name] is a 35 y.o. male, seen on rounds today.  Pt initially presented to the ED for complaints of Aggressive Behavior Currently, the patient is resting comfortably.  Physical Exam  BP 115/71 (BP Location: Left Arm)   Pulse 77   Temp 98 F (36.7 C) (Oral)   Resp 18   Ht 6' (1.829 m)   Wt 113 kg   SpO2 98%   BMI 33.79 kg/m  Physical Exam General: No acute distress Lungs: Breathing is nonlabored Psych: Calm and cooperative  ED Course / MDM   I have reviewed the labs performed to date as well as medications administered while in observation.  Recent changes in the last 24 hours include none.  Plan  Current plan is for inpatient psychiatry. Jackson [name] is under involuntary commitment.      Jackson Hacking, MD 03/13/21 1520

## 2021-03-13 NOTE — ED Provider Notes (Signed)
Emergency Medicine Observation Re-evaluation Note  Jackson [name] is a 35 y.o. male, seen on rounds today.  Pt initially presented to the ED for complaints of Aggressive Behavior Currently, the patient is calm, resting.  Physical Exam  BP (!) 91/52   Pulse 79   Temp 98.2 F (36.8 C) (Oral)   Resp 16   Ht 6' (1.829 m)   Wt 113 kg   SpO2 97%   BMI 33.79 kg/m  Physical Exam General: calm, resting Cardiac: well perfused Lungs: normal WOB Psych: calm  ED Course / MDM  EKG:EKG Interpretation  Date/Time:  Wednesday February 13 2021 16:33:36 EDT Ventricular Rate:  76 PR Interval:  192 QRS Duration: 74 QT Interval:  354 QTC Calculation: 398 R Axis:   48 Text Interpretation: Normal sinus rhythm Junctional ST depression, probably normal Borderline ECG ----------unconfirmed---------- Confirmed by UNCONFIRMED, DOCTOR ([id number]), editor Tamsen Snider [phone]) on 02/15/2021 12:32:46 PM  I have reviewed the labs performed to date as well as medications administered while in observation.  Recent changes in the last 24 hours include none.  Plan  Current plan is for psychiatric disposition/admission. Jackson [name] is under involuntary commitment.      [name], MD 03/13/21 1515

## 2021-03-13 NOTE — ED Notes (Signed)
Food tray with juice was given. 

## 2021-03-13 NOTE — ED Notes (Signed)
Pt is provided with snack and drink of choice at this time. Educated on food and drink rules of [facility]. 

## 2021-03-13 NOTE — BH Assessment (Signed)
Writer contacted [facility] and spoke with ([phone]), patient remains on wait list. 

## 2021-03-13 NOTE — ED Notes (Signed)
Report received from Amy, RN including SBAR. Patient alert and oriented, warm and dry, in no acute distress. Patient denies SI, HI, AVH and pain. Patient made aware of Q15 minute rounds and security cameras for their safety. Patient instructed to come to this nurse with needs or concerns.  

## 2021-03-13 NOTE — ED Notes (Signed)
Pt. Alert and oriented, warm and dry, In no distress. Pt. Denies SI, HI, and AVH. Pt. Encouraged to let nursing staff know of any concerns or needs.    ENVIRONMENTAL ASSESSMENT Potentially harmful objects out of patient reach: Yes.    Personal belongings secured: Yes.   Patient dressed in hospital provided attire only: Yes.   Plastic bags out of patient reach: Yes.   Patient care equipment (cords, cables, call bells, lines, and drains) shortened, removed, or accounted for: Yes.   Equipment and supplies removed from bottom of stretcher: Yes.   Potentially toxic materials out of patient reach: Yes.   Sharps container removed or out of patient reach: Yes.

## 2021-03-14 NOTE — ED Notes (Signed)
Pt is concerned due to still being here, states he is being evicted from apartment and will have nowhere to go due to eviction. Pt asks to speak to psych providers in AM to help change plans

## 2021-03-14 NOTE — BH Assessment (Signed)
Writer contacted [facility] and spoke with ([phone]), patient remains on wait list. 

## 2021-03-14 NOTE — ED Notes (Signed)
Pt provided with food and drink per request for night time snack

## 2021-03-14 NOTE — ED Notes (Signed)
Pt given shower supplies and clean scrubs

## 2021-03-14 NOTE — ED Notes (Signed)
Pt asleep at this time, unable to collect vitals. Will collect pt vitals once awake. 

## 2021-03-14 NOTE — ED Notes (Signed)
Report received from Ariel, RN including SBAR. Patient alert and oriented, warm and dry, in no acute distress. Patient denies SI, HI, AVH and pain. Patient made aware of Q15 minute rounds and security cameras for their safety. Patient instructed to come to this nurse with needs or concerns.  

## 2021-03-14 NOTE — ED Provider Notes (Signed)
Emergency Medicine Observation Re-evaluation Note  Jackson [name] is a 35 y.o. male, seen on rounds today.  Pt initially presented to the ED for complaints of Aggressive Behavior Currently, the patient is calm cooperative, resting comfortably in bed, no distress.  Physical Exam  BP 118/71 (BP Location: Left Arm)   Pulse 70   Temp 98 F (36.7 C) (Oral)   Resp 17   Ht 6' (1.829 m)   Wt 113 kg   SpO2 98%   BMI 33.79 kg/m  Physical Exam General: Resting in bed, no complaints. Cardiac: Regular rate and rhythm around 80 bpm. Lungs: Clear to auscultation bilaterally Psych: Calm and cooperative.  ED Course / MDM   No new lab work over the past 24 hours.  Plan  Current plan is for admission to an inpatient psychiatric facility.Jackson [name] is under involuntary commitment.      Minna Antis, MD 03/14/21 1439

## 2021-03-15 NOTE — ED Notes (Signed)
Hourly rounding reveals patient in room. No complaints, stable, in no acute distress. Q15 minute rounds and monitoring via Security Cameras to continue. 

## 2021-03-15 NOTE — ED Notes (Signed)
Report to include Situation, Background, Assessment, and Recommendations received from Jennifer RN. Patient alert and oriented, warm and dry, in no acute distress. Patient denies SI, HI, AVH and pain. Patient made aware of Q15 minute rounds and security cameras for their safety. Patient instructed to come to me with needs or concerns.  

## 2021-03-15 NOTE — ED Notes (Signed)
IVC  ON  [facility]  WAITLIST 

## 2021-03-15 NOTE — ED Provider Notes (Signed)
Emergency Medicine Observation Re-evaluation Note  Jackson [name] is a 35 y.o. male, seen on rounds today.  Pt initially presented to the ED for complaints of Aggressive Behavior Currently, the patient is sleeping in his bed.  No overnight events  Physical Exam  BP 104/84   Pulse 71   Temp 97.9 F (36.6 C) (Oral)   Resp 17   Ht 6' (1.829 m)   Wt 113 kg   SpO2 100%   BMI 33.79 kg/m  Physical Exam General: No acute distress, sleeping Lungs: No increased work of breathing Psych: No agitation  ED Course / MDM  EKG:  I have reviewed the labs performed to date as well as medications administered while in observation.  Recent changes in the last 24 hours include none.  Plan  Current plan is for inpatient psych placement. Jackson [name] is under involuntary commitment.      Georga Hacking, MD 03/15/21 [phone]

## 2021-03-15 NOTE — ED Notes (Signed)
Pt asleep at this time, unable to collect vitals. Will collect pt vitals once awake. 

## 2021-03-16 DIAGNOSIS — F250 Schizoaffective disorder, bipolar type: Secondary | ICD-10-CM | POA: Diagnosis not present

## 2021-03-16 MED ORDER — BENZONATATE 100 MG PO CAPS
100.0000 mg | ORAL_CAPSULE | Freq: Two times a day (BID) | ORAL | Status: AC | PRN
  Administered 2021-03-16: 100 mg via ORAL
  Filled 2021-03-16 (×3): qty 1

## 2021-03-16 NOTE — ED Notes (Signed)
Report to include Situation, Background, Assessment, and Recommendations received from Jennifer RN. Patient alert and oriented, warm and dry, in no acute distress. Patient denies SI, HI, AVH and pain. Patient made aware of Q15 minute rounds and security cameras for their safety. Patient instructed to come to me with needs or concerns.  

## 2021-03-16 NOTE — ED Notes (Signed)
Hourly rounding reveals patient in room. No complaints, stable, in no acute distress. Q15 minute rounds and monitoring via Security Cameras to continue. 

## 2021-03-16 NOTE — ED Notes (Signed)
IVC pending placement 

## 2021-03-16 NOTE — ED Notes (Signed)
VS not taken, patient asleep 

## 2021-03-16 NOTE — BH Assessment (Signed)
Writer contacted [facility] and spoke with ([phone]), patient remains on wait list

## 2021-03-16 NOTE — ED Notes (Signed)
Breakfast tray received. Pt will shower and receive new scrub set here in a bit.
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
## 2021-03-16 NOTE — ED Provider Notes (Signed)
Emergency Medicine Observation Re-evaluation Note  Jackson [name] is a 35 y.o. male, seen on rounds today.  Pt initially presented to the ED for complaints of Aggressive Behavior Currently, the patient is resting comfortably.  Physical Exam  BP 103/76 (BP Location: Right Arm)   Pulse 74   Temp 97.8 F (36.6 C) (Oral)   Resp 18   Ht 6' (1.829 m)   Wt 113 kg   SpO2 99%   BMI 33.79 kg/m  Physical Exam Constitutional:      Appearance: He is not ill-appearing or toxic-appearing.  HENT:     Head: Atraumatic.  Cardiovascular:     Comments: Appears well perfused Pulmonary:     Effort: Pulmonary effort is normal.  Abdominal:     General: There is no distension.  Musculoskeletal:        General: No deformity.  Neurological:     General: No focal deficit present.    ED Course / MDM  EKG:EKG Interpretation  Date/Time:  Wednesday February 13 2021 16:33:36 EDT Ventricular Rate:  76 PR Interval:  192 QRS Duration: 74 QT Interval:  354 QTC Calculation: 398 R Axis:   48 Text Interpretation: Normal sinus rhythm Junctional ST depression, probably normal Borderline ECG ----------unconfirmed---------- Confirmed by UNCONFIRMED, DOCTOR ([id number]), editor Tamsen Snider [phone]) on 02/15/2021 12:32:46 PM  I have reviewed the labs performed to date as well as medications administered while in observation.  Recent changes in the last 24 hours include none.  Plan  Current plan is for placement. Jackson [name] is under involuntary commitment.      Jackson [place], MD 03/16/21 [phone]

## 2021-03-16 NOTE — ED Notes (Signed)
Patient has a cough and asking for medication. EDP notified.

## 2021-03-17 LAB — RESP PANEL BY RT-PCR (FLU A&B, COVID) ARPGX2
Influenza A by PCR: NEGATIVE
Influenza B by PCR: NEGATIVE
SARS Coronavirus 2 by RT PCR: NEGATIVE

## 2021-03-17 MED ORDER — IBUPROFEN 600 MG PO TABS: 600.0000 mg | ORAL_TABLET | Freq: Four times a day (QID) | ORAL | Status: DC | PRN

## 2021-03-17 MED ORDER — MENTHOL 3 MG MT LOZG
1.0000 | LOZENGE | OROMUCOSAL | Status: DC | PRN
  Administered 2021-03-17: 3 mg via ORAL
  Filled 2021-03-17: qty 9

## 2021-03-17 MED ORDER — ACETAMINOPHEN 325 MG PO TABS
650.0000 mg | ORAL_TABLET | Freq: Four times a day (QID) | ORAL | Status: DC | PRN
  Administered 2021-03-17: 650 mg via ORAL
  Filled 2021-03-17: qty 2

## 2021-03-17 NOTE — ED Notes (Addendum)
Snack and beverage given. 

## 2021-03-17 NOTE — ED Provider Notes (Signed)
Emergency Medicine Observation Re-evaluation Note  Jackson [name] is a 35 y.o. male, seen on rounds today.    Physical Exam  BP 100/61 (BP Location: Right Arm)   Pulse 80   Temp 98.3 F (36.8 C) (Oral)   Resp 18   Ht 6' (1.829 m)   Wt 113 kg   SpO2 97%   BMI 33.79 kg/m  Physical Exam General: Patient resting comfortably in bed Lungs: Patient in no respiratory distress Psych: Patient not combative  ED Course / MDM  EKG: Patient was complaining of a sore throat and runny nose but COVID test was repeated and is negative  Plan  Current plan is for psych placement Jackson [name] is under involuntary commitment.      Jackson Natal, MD 03/17/21 [phone]

## 2021-03-17 NOTE — ED Notes (Signed)
Hourly rounding reveals patient in room. No complaints, stable, in no acute distress. Q15 minute rounds and monitoring via Security Cameras to continue. 
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
## 2021-03-17 NOTE — ED Notes (Signed)
Report to include Situation, Background, Assessment, and Recommendations received from Smith RN. Patient alert and oriented, warm and dry, in no acute distress. Patient denies SI, HI, AVH and pain. Patient made aware of Q15 minute rounds and security cameras for their safety. Patient instructed to come to me with needs or concerns.  

## 2021-03-17 NOTE — ED Notes (Signed)
Snack and beverage given. 

## 2021-03-18 NOTE — Progress Notes (Signed)
   03/18/21 1300  Clinical Encounter Type  Visited With Patient  Visit Type Initial;Spiritual support;Social support  Referral From Other (Comment) (rounding)  Spiritual Encounters  Spiritual Needs Emotional;Other (Comment) (general support)  Chaplain Burris responded to request from Pt for visitation. Pt was in good spirits generally but shared current concerns regarding the status of his housing situation while he is hospitalized. Chaplain Burris provided reflective listening and validation for Pt's concerns. Pt grateful for the visit.

## 2021-03-18 NOTE — ED Notes (Signed)
IVC pending placement 

## 2021-03-18 NOTE — ED Notes (Signed)
Pt provided with snack and drink of choice for night 

## 2021-03-18 NOTE — ED Notes (Signed)
Hourly rounding reveals patient in room. No complaints, stable, in no acute distress. Q15 minute rounds and monitoring via Security Cameras to continue. 

## 2021-03-18 NOTE — ED Notes (Signed)
Report received from Amy, RN including SBAR. Patient alert and oriented, warm and dry, in no acute distress. Patient denies SI, HI, AVH and pain. Patient made aware of Q15 minute rounds and security cameras for their safety. Patient instructed to come to this nurse with needs or concerns.  

## 2021-03-18 NOTE — ED Provider Notes (Signed)
Emergency Medicine Observation Re-evaluation Note  Jackson [name] is a 35 y.o. male, initially presented to the ED for complaints of Aggressive Behavior Currently, the patient is   Physical Exam  BP 105/66 (BP Location: Left Arm)   Pulse 75   Temp 98 F (36.7 C) (Oral)   Resp 16   Ht 6' (1.829 m)   Wt 113 kg   SpO2 98%   BMI 33.79 kg/m    ED Course / MDM   Patient's COVID test yesterday was negative.  No other new lab work performed recently.  Plan  Current plan is for placement to a psychiatric facility [facility]. Marcelles Fulfer is under involuntary commitment.      Minna Antis, MD 03/18/21 [phone]

## 2021-03-18 NOTE — ED Notes (Signed)
Pt is asking if the psychiatrist would call [employer] (ask for Vonna Kotyk) and confirm that he is not being evicted.  Pt stated his mother was incorrect when she stated he was evicted from his current apartment.   [phone]

## 2021-03-18 NOTE — ED Notes (Signed)
Patient resting quietly in room. No noted distress or abnormal behaviors noted. Will continue 15 minute checks and observation by security camera for safety. 

## 2021-03-18 NOTE — BH Assessment (Signed)
Writer contacted [facility] and spoke with ([name]), patient remains on wait list. 

## 2021-03-18 NOTE — ED Notes (Signed)
Report to amy, rn

## 2021-03-19 NOTE — ED Provider Notes (Signed)
Emergency Medicine Observation Re-evaluation Note  Hendryx Blumenberg is a 35 y.o. male, seen on rounds today.  Pt initially presented to the ED for complaints of Aggressive Behavior Currently, the patient is calm, resting.  Physical Exam  BP 103/70   Pulse 68   Temp 98.1 F (36.7 C)   Resp 17   Ht 6' (1.829 m)   Wt 113 kg   SpO2 99%   BMI 33.79 kg/m  Physical Exam General: NAD Cardiac: Well perfused Lungs: Normal WOB Psych: Calm  ED Course / MDM  EKG:EKG Interpretation  Date/Time:  Wednesday February 13 2021 16:33:36 EDT Ventricular Rate:  76 PR Interval:  192 QRS Duration: 74 QT Interval:  354 QTC Calculation: 398 R Axis:   48 Text Interpretation: Normal sinus rhythm Junctional ST depression, probably normal Borderline ECG ----------unconfirmed---------- Confirmed by UNCONFIRMED, DOCTOR ([id number]), editor Tamsen Snider [phone]) on 02/15/2021 12:32:46 PM  I have reviewed the labs performed to date as well as medications administered while in observation.  Recent changes in the last 24 hours include none.  Plan  Current plan is for SW disposition. Lucius Puller is under involuntary commitment.      [name], MD 03/19/21 [phone]

## 2021-03-19 NOTE — ED Notes (Signed)
Report received from Ally, RN including SBAR. Patient alert and oriented, warm and dry, in no acute distress. Patient denies SI, HI, AVH and pain. Patient made aware of Q15 minute rounds and security cameras for their safety. Patient instructed to come to this nurse with needs or concerns. 

## 2021-03-19 NOTE — ED Notes (Deleted)
VOL /[facility] WAIT LIST 

## 2021-03-19 NOTE — ED Notes (Signed)
Pt is provided with night time snack, drink at this time. 

## 2021-03-19 NOTE — ED Notes (Signed)
Pt received lunch 

## 2021-03-19 NOTE — ED Notes (Signed)
Pt in shower.  

## 2021-03-20 NOTE — ED Notes (Signed)
Pt asleep at this time, unable to collect vitals. Will collect pt vitals once awake. 

## 2021-03-20 NOTE — BH Assessment (Signed)
Writer contacted [facility] and spoke with ([phone]), patient remains on wait list.

## 2021-03-20 NOTE — ED Provider Notes (Signed)
Emergency Medicine Observation Re-evaluation Note  Jackson [name] is a 35 y.o. male, initially presented to the ED for complaints of Aggressive Behavior  No acute events overnight.  Physical Exam  BP 104/75 (BP Location: Right Arm)   Pulse 78   Temp 98 F (36.7 C) (Oral)   Resp 16   Ht 6' (1.829 m)   Wt 113 kg   SpO2 99%   BMI 33.79 kg/m    ED Course / MDM   No new lab work for review.  Plan  Current plan is for placement to an inpatient facility [facility]. Jackson [name] is under involuntary commitment.      Minna Antis, MD 03/20/21 1407

## 2021-03-20 NOTE — ED Notes (Signed)
IVC renewed by Nanine Means NP

## 2021-03-21 NOTE — ED Provider Notes (Signed)
Emergency Medicine Observation Re-evaluation Note  Aadhav Brugh is a 35 y.o. male, seen on rounds today.  Pt initially presented to the ED for complaints of Aggressive Behavior Currently, the patient is resting.  Physical Exam  BP 104/75 (BP Location: Right Arm)   Pulse 78   Temp 98 F (36.7 C) (Oral)   Resp 16   Ht 6' (1.829 m)   Wt 113 kg   SpO2 99%   BMI 33.79 kg/m   ED Course / MDM  EKG:EKG Interpretation  Date/Time:  Wednesday February 13 2021 16:33:36 EDT Ventricular Rate:  76 PR Interval:  192 QRS Duration: 74 QT Interval:  354 QTC Calculation: 398 R Axis:   48 Text Interpretation: Normal sinus rhythm Junctional ST depression, probably normal Borderline ECG ----------unconfirmed---------- Confirmed by UNCONFIRMED, DOCTOR ([id number]), editor Tamsen Snider [phone]) on 02/15/2021 12:32:46 PM  I have reviewed the labs performed to date as well as medications administered while in observation.  Recent changes in the last 24 hours include none.  Plan  Current plan is for psych placement. Freddrick Tzeng is under involuntary commitment.      [name], MD 03/21/21 [phone]

## 2021-03-21 NOTE — BH Assessment (Signed)
Writer spoke with [facility] ([phone]), patient remains on wait list

## 2021-03-21 NOTE — BH Assessment (Signed)
Writer contacted [facility] and spoke with ([phone]), patient remains on wait list. 

## 2021-03-21 NOTE — ED Notes (Signed)
IVC  [facility]  WAITLIST 

## 2021-03-22 NOTE — ED Notes (Signed)
Lunch given to pt

## 2021-03-22 NOTE — ED Notes (Signed)
IVC/[facility] waitlist 

## 2021-03-22 NOTE — ED Notes (Signed)
Pt ate 100% of dinner

## 2021-03-22 NOTE — ED Notes (Signed)
Patient slept throughout the night. VS not obtained to maintain uninterrupted sleep.  

## 2021-03-22 NOTE — ED Notes (Signed)
Pt in restroom 

## 2021-03-22 NOTE — ED Notes (Signed)
Pt given dinner  

## 2021-03-22 NOTE — ED Notes (Signed)
Report from ally, rn.  

## 2021-03-22 NOTE — ED Notes (Signed)
[facility] on [facility] waitlist 

## 2021-03-22 NOTE — ED Notes (Signed)
Pt given ice cream and drink.

## 2021-03-23 NOTE — ED Notes (Signed)
Report to include Situation, Background, Assessment, and Recommendations received from Ariel RN. Patient alert and oriented, warm and dry, in no acute distress. Patient denies SI, HI, AVH and pain. Patient made aware of Q15 minute rounds and security cameras for their safety. Patient instructed to come to me with needs or concerns.  

## 2021-03-23 NOTE — ED Provider Notes (Signed)
Emergency Medicine Observation Re-evaluation Note  Jackson [name] is a 35 y.o. male, seen on rounds today.  Pt initially presented to the ED for complaints of Aggressive Behavior No acute events overnight  Physical Exam  BP 124/76 (BP Location: Left Arm)   Pulse 74   Temp 98.3 F (36.8 C) (Oral)   Resp 18   Ht 1.829 m (6')   Wt 113 kg   SpO2 99%   BMI 33.79 kg/m  Physical Exam General: Calm in bed no complaints  Lungs: No increased work of breathing Psych: No aggressive behavior  ED Course / MDM  No significant changes requiring medical decision making,  Plan  Patient remains on [facility] Kiptyn Crom is under involuntary commitment.      Jene Every, MD 03/23/21 [phone]

## 2021-03-23 NOTE — ED Notes (Signed)
Lunch tray given. 

## 2021-03-23 NOTE — ED Notes (Signed)
Hourly rounding reveals patient in room. No complaints, stable, in no acute distress. Q15 minute rounds and monitoring via Security Cameras to continue. 

## 2021-03-23 NOTE — ED Notes (Signed)
IVC/[facility] waitlist 

## 2021-03-23 NOTE — ED Notes (Signed)
Report to ariel, rn.  

## 2021-03-23 NOTE — ED Notes (Signed)
Given dinner tray

## 2021-03-24 NOTE — ED Notes (Signed)
IVC/pending [facility] waitlist 

## 2021-03-24 NOTE — ED Notes (Signed)
Hourly rounding reveals patient in room. No complaints, stable, in no acute distress. Q15 minute rounds and monitoring via Security Cameras to continue. 

## 2021-03-24 NOTE — ED Provider Notes (Signed)
Emergency Medicine Observation Re-evaluation Note  Jackson [name] is a 35 y.o. male, seen on rounds today.  Pt initially presented to the ED for complaints of Aggressive Behavior  Currently, the patient is is no acute distress. Pt sleeping in bed   Physical Exam  Blood pressure 102/75, pulse 75, temperature 98.4 F (36.9 C), temperature source Oral, resp. rate 18, height 6' (1.829 m), weight 113 kg, SpO2 96 %.  Physical Exam General: Resting comfortable Psych: no aggression at this time.      ED Course / MDM     I have reviewed the labs performed to date as well as medications administered while in observation.  Recent changes in the last 24 hours include none  Plan   Current plan is to continue to wait for psych plan/placement if felt warranted  Patient is under full IVC at this time.   Concha Se, MD 03/24/21 [phone]

## 2021-03-24 NOTE — ED Notes (Signed)
Snack and beverage given. 

## 2021-03-24 NOTE — ED Notes (Signed)
Pt given lunch tray.

## 2021-03-24 NOTE — BH Assessment (Signed)
Writer contacted [facility] and spoke with ([phone]), patient remains on wait list. 

## 2021-03-24 NOTE — ED Notes (Signed)
Breakfast given.  

## 2021-03-24 NOTE — ED Notes (Signed)
Pt given dinner  

## 2021-03-24 NOTE — ED Notes (Signed)
VS not taken, patient asleep 

## 2021-03-24 NOTE — ED Notes (Signed)
Report to include Situation, Background, Assessment, and Recommendations received from Amy RN. Patient alert and oriented, warm and dry, in no acute distress. Patient denies SI, HI, AVH and pain. Patient made aware of Q15 minute rounds and security cameras for their safety. Patient instructed to come to me with needs or concerns.  

## 2021-03-25 DIAGNOSIS — F250 Schizoaffective disorder, bipolar type: Secondary | ICD-10-CM | POA: Diagnosis not present

## 2021-03-25 NOTE — ED Notes (Signed)
IVC/  PENDING  PLACEMENT 

## 2021-03-25 NOTE — ED Notes (Signed)
Hourly rounding reveals patient in room. No complaints, stable, in no acute distress. Q15 minute rounds and monitoring via Security Cameras to continue. 

## 2021-03-25 NOTE — ED Notes (Signed)
Ate 100% of breakfast tray.  

## 2021-03-25 NOTE — BH Assessment (Signed)
Writer contacted [facility] and spoke with ([phone]), patient remains on wait list. 

## 2021-03-25 NOTE — BH Assessment (Signed)
Writer contacted [facility] and spoke with (Mr. [phone]), patient remains on wait list.

## 2021-03-25 NOTE — ED Notes (Addendum)
Gave food tray with drink and spoon. When done we will collect all trash.

## 2021-03-25 NOTE — ED Provider Notes (Signed)
Emergency Medicine Observation Re-evaluation Note  Jackson [name] is a 35 y.o. male, seen on rounds today.  Pt initially presented to the ED for complaints of Aggressive Behavior Currently, the patient is resting.  Physical Exam  BP 106/70 (BP Location: Right Arm)   Pulse 77   Temp 98.4 F (36.9 C) (Oral)   Resp 18   Ht 6' (1.829 m)   Wt 113 kg   SpO2 97%   BMI 33.79 kg/m  Physical Exam General: NAD Lungs: unlabored Psych: currently calm/resting  ED Course / MDM  EKG:EKG Interpretation  Date/Time:  Wednesday February 13 2021 16:33:36 EDT Ventricular Rate:  76 PR Interval:  192 QRS Duration: 74 QT Interval:  354 QTC Calculation: 398 R Axis:   48 Text Interpretation: Normal sinus rhythm Junctional ST depression, probably normal Borderline ECG ----------unconfirmed---------- Confirmed by UNCONFIRMED, DOCTOR ([id number]), editor Tamsen Snider [phone]) on 02/15/2021 12:32:46 PM  I have reviewed the labs performed to date as well as medications administered while in observation.  Recent changes in the last 24 hours include .  Plan  Current plan is for psych eval. Jackson [name] is under involuntary commitment.      Willy Eddy, MD 03/25/21 [phone]

## 2021-03-25 NOTE — ED Notes (Signed)
Pt using phone  

## 2021-03-25 NOTE — Consult Note (Signed)
[facility] Face-to-Face Psychiatry Consult   Reason for Consult: Follow-up consult for this patient with schizoaffective disorder who remains in the emergency room awaiting referral to [facility] Referring Physician: Katrinka Blazing Patient Identification: Jackson [name] MRN:  [id number] Principal Diagnosis: Schizoaffective disorder, bipolar type (HCC) Diagnosis:  Principal Problem:   Schizoaffective disorder, bipolar type (HCC)   Total Time spent with patient: 30 minutes  Subjective:   Jackson [name] is a 35 y.o. male patient admitted with "I really would like to be discharged".  HPI: Patient seen chart reviewed.  Patient has not had any significant behavior problems recently.  His room is a mess but it could be worse.  The patient was pleasant and cooperative with good eye contact euthymic mood lucid speech.  Not pressured not agitated.  Cooperative with medication.  Denies suicidal ideation denies hallucinations.  Past Psychiatric History: Schizoaffective disorder or bipolar disorder with multiple prior hospitalizations  Risk to Self:   Risk to Others:   Prior Inpatient Therapy:   Prior Outpatient Therapy:    Past Medical History:  Past Medical History:  Diagnosis Date   Bipolar 1 disorder (HCC)    Schizoaffective disorder (HCC)    History reviewed. No pertinent surgical history. Family History: History reviewed. No pertinent family history. Family Psychiatric  History: See previous Social History:  Social History   Substance and Sexual Activity  Alcohol Use Not Currently     Social History   Substance and Sexual Activity  Drug Use Not Currently    Social History   Socioeconomic History   Marital status: Single    Spouse name: Not on file   Number of children: Not on file   Years of education: Not on file   Highest education level: Not on file  Occupational History   Not on file  Tobacco Use   Smoking status: Light Smoker    Packs/day: 0.25    Types:  Cigarettes   Smokeless tobacco: Never  Vaping Use   Vaping Use: Never used  Substance and Sexual Activity   Alcohol use: Not Currently   Drug use: Not Currently   Sexual activity: Not on file  Other Topics Concern   Not on file  Social History Narrative   Not on file   Social Determinants of Health   Financial Resource Strain: Not on file  Food Insecurity: Not on file  Transportation Needs: Not on file  Physical Activity: Not on file  Stress: Not on file  Social Connections: Not on file   Additional Social History:    Allergies:  No Known Allergies  Labs: No results found for this or any previous visit (from the past 48 hour(s)).  Current Facility-Administered Medications  Medication Dose Route Frequency Provider Last Rate Last Admin   acetaminophen (TYLENOL) tablet 650 mg  650 mg Oral Q6H PRN Ward, Kristen N, DO   650 mg at 03/17/21 0050   diphenhydrAMINE (BENADRYL) injection 50 mg  50 mg Intramuscular Q4H PRN Adelin Ventrella T, MD   50 mg at 02/18/21 1453   divalproex (DEPAKOTE) DR tablet 1,000 mg  1,000 mg Oral Q12H Pleasant Britz T, MD   1,000 mg at 03/25/21 9485   haloperidol lactate (HALDOL) injection 5 mg  5 mg Intramuscular Q4H PRN Natalie Mceuen T, MD   5 mg at 02/18/21 1453   ibuprofen (ADVIL) tablet 600 mg  600 mg Oral Q6H PRN Ward, Kristen N, DO       LORazepam (ATIVAN) injection 2 mg  2  mg Intramuscular Q4H PRN Makeila Yamaguchi T, MD   2 mg at 02/18/21 1454   menthol-cetylpyridinium (CEPACOL) lozenge 3 mg  1 lozenge Oral PRN Ward, Baxter Hire N, DO   3 mg at 03/17/21 0214   OLANZapine zydis (ZYPREXA) disintegrating tablet 15 mg  15 mg Oral BID Foye Deer, RPH   15 mg at 03/25/21 1761   Current Outpatient Medications  Medication Sig Dispense Refill   ARIPiprazole (ABILIFY) 15 MG tablet Take 1 tablet (15 mg total) by mouth daily for 8 days. 8 tablet 0   ARIPiprazole ER (ABILIFY MAINTENA) 400 MG SRER injection Inject 2 mLs (400 mg total) into the muscle every 28  (twenty-eight) days. (Patient not taking: Reported on 02/14/2021) 1 each    chlorproMAZINE (THORAZINE) 50 MG tablet Take 5 tablets (250 mg total) by mouth 3 (three) times daily. (Patient not taking: Reported on 02/14/2021) 90 tablet 0   divalproex (DEPAKOTE) 500 MG DR tablet Take 2 tablets (1,000 mg total) by mouth every 12 (twelve) hours. (Patient not taking: Reported on 02/14/2021) 120 tablet 1   OLANZapine zydis (ZYPREXA) 15 MG disintegrating tablet Take 2 tablets (30 mg total) by mouth at bedtime. (Patient not taking: Reported on 02/14/2021) 60 tablet 0    Musculoskeletal: Strength & Muscle Tone: within normal limits Gait & Station: normal Patient leans: N/A            Psychiatric Specialty Exam:  Presentation  General Appearance: Disheveled  Eye Contact:Fleeting  Speech:Normal Rate  Speech Volume:Increased  Handedness:Right   Mood and Affect  Mood:Angry; Irritable  Affect:Congruent   Thought Process  Thought Processes:Disorganized  Descriptions of Associations:Loose  Orientation:Full (Time, Place and Person)  Thought Content:Delusions; Paranoid Ideation; Tangential  History of Schizophrenia/Schizoaffective disorder:Yes  Duration of Psychotic Symptoms:Greater than six months  Hallucinations:No data recorded Ideas of Reference:Delusions  Suicidal Thoughts:No data recorded Homicidal Thoughts:No data recorded  Sensorium  Memory:Immediate Poor; Recent Poor; Remote Poor  Judgment:Impaired  Insight:Lacking   Executive Functions  Concentration:Poor  Attention Span:Fair  Recall:Poor  Fund of Knowledge:Fair  Language:Fair   Psychomotor Activity  Psychomotor Activity: No data recorded  Assets  Assets:Financial Resources/Insurance; Resilience; Social Support   Sleep  Sleep: No data recorded  Physical Exam: Physical Exam Vitals and nursing note reviewed.  Constitutional:      Appearance: Normal appearance.  HENT:     Head:  Normocephalic and atraumatic.     Mouth/Throat:     Pharynx: Oropharynx is clear.  Eyes:     Pupils: Pupils are equal, round, and reactive to light.  Cardiovascular:     Rate and Rhythm: Normal rate and regular rhythm.  Pulmonary:     Effort: Pulmonary effort is normal.     Breath sounds: Normal breath sounds.  Abdominal:     General: Abdomen is flat.     Palpations: Abdomen is soft.  Musculoskeletal:        General: Normal range of motion.  Skin:    General: Skin is warm and dry.  Neurological:     General: No focal deficit present.     Mental Status: He is alert. Mental status is at baseline.  Psychiatric:        Attention and Perception: Attention normal.        Mood and Affect: Mood normal.        Speech: Speech normal.        Behavior: Behavior normal.        Thought Content: Thought content normal.  Cognition and Memory: Cognition normal.   Review of Systems  Constitutional: Negative.   HENT: Negative.    Eyes: Negative.   Respiratory: Negative.    Cardiovascular: Negative.   Gastrointestinal: Negative.   Musculoskeletal: Negative.   Skin: Negative.   Neurological: Negative.   Psychiatric/Behavioral: Negative.    Blood pressure 124/79, pulse 79, temperature 100 F (37.8 C), temperature source Oral, resp. rate 18, height 6' (1.829 m), weight 113 kg, SpO2 97 %. Body mass index is 33.79 kg/m.  Treatment Plan Summary: Plan plan has been for referral to [facility] which as time goes by looks less likely.  Patient is currently stable although we know that he is at high risk of relapse with worsening mood symptoms and behaviors outside the hospital.  Reviewed medication plan with patient.  We will try to get in touch with his mother again this week to discuss discharge planning.  Disposition: See previous  Mordecai Rasmussen, MD 03/25/2021 5:49 PM

## 2021-03-25 NOTE — ED Notes (Signed)
Given lunch

## 2021-03-25 NOTE — ED Notes (Signed)
Patient gave trash and spoon back to nurses station.

## 2021-03-25 NOTE — ED Notes (Signed)
Gave patient mango ice and apple juice.

## 2021-03-25 NOTE — ED Notes (Signed)
Resumed care from ally rn  Pt lying on bed in room, awake.

## 2021-03-26 NOTE — BH Assessment (Signed)
Writer contacted [facility] and spoke with ([phone]), patient remains on wait list. 

## 2021-03-26 NOTE — ED Notes (Signed)
Patient was given spoon with dinner. Patient is going to return spoon when finished.

## 2021-03-26 NOTE — ED Notes (Signed)
IVC/ Pending Placement on [facility] Waitlist 

## 2021-03-26 NOTE — ED Provider Notes (Signed)
Emergency Medicine Observation Re-evaluation Note  Jackson [name] is a 35 y.o. male, seen on rounds today.   Physical Exam  BP 124/79 (BP Location: Right Arm)   Pulse 79   Temp 100 F (37.8 C) (Oral)   Resp 18   Ht 6' (1.829 m)   Wt 113 kg   SpO2 97%   BMI 33.79 kg/m  Physical Exam General: Patient resting comfortably in bed Lungs: Patient in no respiratory distress Psych: Patient not combative  ED Course / MDM  EKG:EKG Interpretation  Date/Time:  Wednesday February 13 2021 16:33:36 EDT Ventricular Rate:  76 PR Interval:  192 QRS Duration: 74 QT Interval:  354 QTC Calculation: 398 R Axis:   48 Text Interpretation: Normal sinus rhythm Junctional ST depression, probably normal Borderline ECG ----------unconfirmed---------- Confirmed by UNCONFIRMED, DOCTOR ([id number]), editor Tamsen Snider [phone]) on 02/15/2021 12:32:46 PM    Plan  Current plan is for inpatient placement.  Patient has developed low-grade temperature of 100 we will have to watch this carefully. Jackson [name] is under involuntary commitment.      Arnaldo Natal, MD 03/26/21 [phone]

## 2021-03-26 NOTE — ED Notes (Signed)
Resumed care from annie rn.  Pt in room lying on bed resting with eyes closed.

## 2021-03-26 NOTE — ED Notes (Signed)
Patient discarded spoon from dinner and was given the opportunity to clean up room. Patient disposed of most of the trays and cups that were in his room.

## 2021-03-26 NOTE — ED Notes (Signed)
IVC, pending placement [facility] waitlist

## 2021-03-26 NOTE — ED Notes (Signed)
Snacks provided.

## 2021-03-27 NOTE — ED Notes (Signed)
Spoon returned  

## 2021-03-27 NOTE — ED Notes (Signed)
Pt given meal tray and a cup of sprite.  

## 2021-03-27 NOTE — ED Notes (Signed)
Pt provided with requested second portion of breakfast. Dietary was called and they brought another tray.

## 2021-03-27 NOTE — ED Notes (Signed)
Report received from Reina, RN including SBAR. Patient alert and oriented, warm and dry, in no acute distress. Patient denies SI, HI, AVH and pain. Patient made aware of Q15 minute rounds and security cameras for their safety. Patient instructed to come to this nurse with needs or concerns. 

## 2021-03-27 NOTE — ED Notes (Signed)
Pt returned spoon.

## 2021-03-27 NOTE — ED Provider Notes (Signed)
Emergency Medicine Observation Re-evaluation Note  Jackson [name] is a 35 y.o. male, seen on rounds today.  Pt initially presented to the ED for complaints of Aggressive Behavior Currently, the patient is laying in bed, denies complaints.  Physical Exam  BP 91/67 (BP Location: Left Arm)   Pulse 72   Temp 98 F (36.7 C) (Oral)   Resp 14   Ht 6' (1.829 m)   Wt 113 kg   SpO2 98%   BMI 33.79 kg/m  Physical Exam Constitutional: Resting comfortably. Eyes: Conjunctivae are normal. Head: Atraumatic. Nose: No congestion/rhinnorhea. Mouth/Throat: Mucous membranes are moist. Neck: Normal ROM Cardiovascular: No cyanosis noted. Respiratory: Normal respiratory effort. Gastrointestinal: Non-distended. Genitourinary: deferred Musculoskeletal: No lower extremity tenderness nor edema. Neurologic:  Normal speech and language. No gross focal neurologic deficits are appreciated. Skin:  Skin is warm, dry and intact. No rash noted.   ED Course / MDM  EKG:EKG Interpretation  Date/Time:  Wednesday February 13 2021 16:33:36 EDT Ventricular Rate:  76 PR Interval:  192 QRS Duration: 74 QT Interval:  354 QTC Calculation: 398 R Axis:   48 Text Interpretation: Normal sinus rhythm Junctional ST depression, probably normal Borderline ECG ----------unconfirmed---------- Confirmed by UNCONFIRMED, DOCTOR ([id number]), editor Tamsen Snider [phone]) on 02/15/2021 12:32:46 PM  I have reviewed the labs performed to date as well as medications administered while in observation.  Recent changes in the last 24 hours include none.  Plan  Current plan is for inpatient psychiatric admission. Jackson [name] is under involuntary commitment.      Chesley Noon, MD 03/27/21 1005

## 2021-03-27 NOTE — ED Notes (Signed)
Dr Larinda Buttery was at bedside with pt.

## 2021-03-28 NOTE — BH Assessment (Signed)
Writer contacted [facility] and spoke with (Ms. Jones-[phone]), patient remains on wait list. 

## 2021-03-28 NOTE — ED Provider Notes (Signed)
Emergency Medicine Observation Re-evaluation Note  Jackson [name] is a 35 y.o. male, seen on rounds today.  Pt initially presented to the ED for complaints of Aggressive Behavior Currently, the patient is resting in bed and has no acute complaints.  Physical Exam  BP 105/70   Pulse 78   Temp 98.3 F (36.8 C) (Oral)   Resp 18   Ht 6' (1.829 m)   Wt 113 kg   SpO2 97%   BMI 33.79 kg/m  Physical Exam General: Comfortable appearing. Cardiac: Good peripheral perfusion. Lungs: Normal respiratory effort. Psych: Calm and cooperative.  ED Course / MDM  EKG:EKG Interpretation  Date/Time:  Wednesday February 13 2021 16:33:36 EDT Ventricular Rate:  76 PR Interval:  192 QRS Duration: 74 QT Interval:  354 QTC Calculation: 398 R Axis:   48 Text Interpretation: Normal sinus rhythm Junctional ST depression, probably normal Borderline ECG ----------unconfirmed---------- Confirmed by UNCONFIRMED, DOCTOR ([id number]), editor Tamsen Snider [phone]) on 02/15/2021 12:32:46 PM  I have reviewed the labs performed to date as well as medications administered while in observation.  There have been no recent changes to his status in the last 24 hours.  Plan  Current plan is for inpatient psychiatric admission. Jackson [name] is under involuntary commitment.      Dionne Bucy, MD 03/28/21 1240

## 2021-03-28 NOTE — BH Assessment (Signed)
Writer contacted [facility] and spoke with (Stephanie-[phone]), patient remains on wait list. 

## 2021-03-28 NOTE — ED Notes (Signed)
IVC/Still on [facility] Wait list ?

## 2021-03-28 NOTE — ED Notes (Signed)
[facility] on [facility] waitlist 

## 2021-03-28 NOTE — ED Notes (Signed)
Pt asleep at this time, unable to collect vitals. Will collect pt vitals once awake. 

## 2021-03-29 NOTE — ED Provider Notes (Signed)
Emergency Medicine Observation Re-evaluation Note  Jackson [name] is a 35 y.o. male, seen on rounds today.  No events overnight  Physical Exam  BP (!) 122/96   Pulse 74   Temp 97.7 F (36.5 C) (Oral)   Resp 18   Ht 1.829 m (6')   Wt 113 kg   SpO2 99%   BMI 33.79 kg/m  Physical Exam General: Comfortable, no complaints  Lungs: No increased respiratory effort Psych: Nursing staff reports no issues today  ED Course / MDM     Plan  Patient remains on [facility] Jackson [name] is under involuntary commitment.      Jene Every, MD 03/29/21 [phone]

## 2021-03-29 NOTE — ED Notes (Signed)
Hourly rounding reveals patient in room. No complaints, stable, in no acute distress. Q15 minute rounds and monitoring via Security Cameras to continue. 

## 2021-03-29 NOTE — ED Notes (Signed)
Pt returned call to [facility].

## 2021-03-29 NOTE — ED Notes (Signed)
Pt given clean scrubs.  

## 2021-03-29 NOTE — ED Notes (Signed)
Report to include Situation, Background, Assessment, and Recommendations received from Amy RN. Patient alert and oriented, warm and dry, in no acute distress. Patient denies SI, HI, AVH and pain. Patient made aware of Q15 minute rounds and security cameras for their safety. Patient instructed to come to me with needs or concerns.  

## 2021-03-29 NOTE — BH Assessment (Signed)
Writer contacted [facility] and spoke with ([phone]), patient remains on wait list 

## 2021-03-29 NOTE — ED Notes (Signed)
IVC  ON  [facility]  WAITLIST 

## 2021-03-29 NOTE — BH Assessment (Signed)
Writer contacted [facility] and spoke with (Jocelyn Booker-[phone]), patient remains on wait list.

## 2021-03-29 NOTE — ED Notes (Signed)
Pt given dinner tray.

## 2021-03-29 NOTE — ED Notes (Signed)
Unable to obtain vitals due to patient sleeping. Will continue to monitor.   

## 2021-03-29 NOTE — BH Assessment (Signed)
Writer contacted [facility] and spoke with ([phone]), patient remains on their waiting list.

## 2021-03-30 DIAGNOSIS — F250 Schizoaffective disorder, bipolar type: Secondary | ICD-10-CM | POA: Diagnosis not present

## 2021-03-30 NOTE — ED Notes (Signed)
Snack and beverage given. 

## 2021-03-30 NOTE — ED Provider Notes (Signed)
Emergency Medicine Observation Re-evaluation Note  Jackson [name] is a 35 y.o. male, seen on rounds today.  Pt initially presented to the ED for complaints of Aggressive Behavior  Currently, the patient is is no acute distress.  No issues overnight per psych nurse   Physical Exam  Blood pressure 110/68, pulse 75, temperature 97.9 F (36.6 C), temperature source Oral, resp. rate 18, height 6' (1.829 m), weight 113 kg, SpO2 97 %.  Physical Exam General: No apparent distress Neuro: resting comfortable Psych: no agitation      ED Course / MDM     I have reviewed the labs performed to date as well as medications administered while in observation.  Recent changes in the last 24 hours include none   Plan   Current plan is to continue to wait for bed at [facility]  Patient is under full IVC at this time.   Concha Se, MD 03/30/21 [phone]

## 2021-03-30 NOTE — ED Notes (Signed)
Hourly rounding reveals patient in room. No complaints, stable, in no acute distress. Q15 minute rounds and monitoring via Security Cameras to continue. 

## 2021-03-30 NOTE — Consult Note (Signed)
[facility] Face-to-Face Psychiatry Consult   Reason for Consult: Follow-up consult 35 year old man with schizoaffective disorder. Referring Physician: Katrinka Blazing Patient Identification: Jackson [name] MRN:  [id number] Principal Diagnosis: Schizoaffective disorder, bipolar type (HCC) Diagnosis:  Principal Problem:   Schizoaffective disorder, bipolar type (HCC)   Total Time spent with patient: 30 minutes  Subjective:   Jackson [name] is a 35 y.o. male patient admitted with "I have to be out at the end of the month (from his apartment) and need to get my stuff.  My mother's husband said he was going to go through it and throw away things I don't need.  I have a lot of music equipment and he doesn't know what it is.  It's overwhelming".  HPI: Follow-up evaluation on patient.  Patient is at his baseline, calm and compliant with his medications, no issues on the unit.  He reports feeling overwhelmed as he cannot move his things out of his apartment and has "a lot of money" in his music equipment and computer programming things.  Jackson [name] is frustrated that his mother "keeps doing this to me.  Every time I discharge, she calls the police in a few days, knowing I will run.  Then, they IVC and bring me here."  He continues to state this has happened frequently in the past year and his mother wants him to be in the hospital because she is worried he will get hurt.  When asked about not taking his medications, he reports he does take these.  No insight that this is often the case;  he stops his medications - becomes unstable and violent - and returns to the hospital.  Awaiting [facility] admission.   Past Psychiatric History: Multiple hospitalizations just in the past year due to noncompliance with medicine  Risk to Self:  none Risk to Others:  none Prior Inpatient Therapy:  multiple Prior Outpatient Therapy:  ACT team  Past Medical History:  Past Medical History:  Diagnosis Date   Bipolar 1 disorder (HCC)     Schizoaffective disorder (HCC)    History reviewed. No pertinent surgical history. Family History: History reviewed. No pertinent family history. Family Psychiatric  History: See previous Social History:  Social History   Substance and Sexual Activity  Alcohol Use Not Currently     Social History   Substance and Sexual Activity  Drug Use Not Currently    Social History   Socioeconomic History   Marital status: Single    Spouse name: Not on file   Number of children: Not on file   Years of education: Not on file   Highest education level: Not on file  Occupational History   Not on file  Tobacco Use   Smoking status: Light Smoker    Packs/day: 0.25    Types: Cigarettes   Smokeless tobacco: Never  Vaping Use   Vaping Use: Never used  Substance and Sexual Activity   Alcohol use: Not Currently   Drug use: Not Currently   Sexual activity: Not on file  Other Topics Concern   Not on file  Social History Narrative   Not on file   Social Determinants of Health   Financial Resource Strain: Not on file  Food Insecurity: Not on file  Transportation Needs: Not on file  Physical Activity: Not on file  Stress: Not on file  Social Connections: Not on file   Additional Social History:    Allergies:  No Known Allergies  Labs: No results found for this or any  previous visit (from the past 48 hour(s)).  Current Facility-Administered Medications  Medication Dose Route Frequency Provider Last Rate Last Admin   acetaminophen (TYLENOL) tablet 650 mg  650 mg Oral Q6H PRN Ward, Kristen N, DO   650 mg at 03/17/21 0050   diphenhydrAMINE (BENADRYL) injection 50 mg  50 mg Intramuscular Q4H PRN [name], [name], MD   50 mg at 02/18/21 1453   divalproex (DEPAKOTE) DR tablet 1,000 mg  1,000 mg Oral Q12H [name], [name], MD   1,000 mg at 03/30/21 6283   haloperidol lactate (HALDOL) injection 5 mg  5 mg Intramuscular Q4H PRN [name], [name], MD   5 mg at 02/18/21 1453   ibuprofen (ADVIL)  tablet 600 mg  600 mg Oral Q6H PRN Ward, Kristen N, DO       LORazepam (ATIVAN) injection 2 mg  2 mg Intramuscular Q4H PRN [name], [name], MD   2 mg at 02/18/21 1454   menthol-cetylpyridinium (CEPACOL) lozenge 3 mg  1 lozenge Oral PRN Ward, Kristen N, DO   3 mg at 03/17/21 0214   OLANZapine zydis (ZYPREXA) disintegrating tablet 15 mg  15 mg Oral BID Foye Deer, RPH   15 mg at 03/30/21 1517   Current Outpatient Medications  Medication Sig Dispense Refill   ARIPiprazole (ABILIFY) 15 MG tablet Take 1 tablet (15 mg total) by mouth daily for 8 days. 8 tablet 0   ARIPiprazole ER (ABILIFY MAINTENA) 400 MG SRER injection Inject 2 mLs (400 mg total) into the muscle every 28 (twenty-eight) days. (Patient not taking: Reported on 02/14/2021) 1 each    chlorproMAZINE (THORAZINE) 50 MG tablet Take 5 tablets (250 mg total) by mouth 3 (three) times daily. (Patient not taking: Reported on 02/14/2021) 90 tablet 0   divalproex (DEPAKOTE) 500 MG DR tablet Take 2 tablets (1,000 mg total) by mouth every 12 (twelve) hours. (Patient not taking: Reported on 02/14/2021) 120 tablet 1   OLANZapine zydis (ZYPREXA) 15 MG disintegrating tablet Take 2 tablets (30 mg total) by mouth at bedtime. (Patient not taking: Reported on 02/14/2021) 60 tablet 0    Musculoskeletal: Strength & Muscle Tone: within normal limits Gait & Station: normal Patient leans: N/A  Psychiatric Specialty Exam: Physical Exam Vitals and nursing note reviewed.  Constitutional:      Appearance: Normal appearance.  HENT:     Head: Normocephalic and atraumatic.     Mouth/Throat:     Pharynx: Oropharynx is clear.  Eyes:     Pupils: Pupils are equal, round, and reactive to light.  Cardiovascular:     Rate and Rhythm: Normal rate and regular rhythm.  Pulmonary:     Effort: Pulmonary effort is normal.     Breath sounds: Normal breath sounds.  Abdominal:     General: Abdomen is flat.     Palpations: Abdomen is soft.  Musculoskeletal:         General: Normal range of motion.  Skin:    General: Skin is warm and dry.  Neurological:     General: No focal deficit present.     Mental Status: He is alert. Mental status is at baseline.  Psychiatric:        Attention and Perception: Attention normal.        Mood and Affect: Mood normal.        Speech: Speech normal.        Behavior: Behavior is cooperative.        Thought Content: Thought content normal.  Cognition and Memory: Cognition normal.        Judgment: Judgment normal.    Review of Systems  Constitutional: Negative.   HENT: Negative.    Eyes: Negative.   Respiratory: Negative.    Cardiovascular: Negative.   Gastrointestinal: Negative.   Musculoskeletal: Negative.   Skin: Negative.   Neurological: Negative.   Psychiatric/Behavioral: Negative.     Blood pressure 118/76, pulse 77, temperature 98.6 F (37 C), temperature source Oral, resp. rate 17, height 6' (1.829 m), weight 113 kg, SpO2 98 %.Body mass index is 33.79 kg/m.  General Appearance: Casual  Eye Contact:  Good  Speech:  Normal Rate  Volume:  Normal  Mood:  Euthymic  Affect:  Congruent  Thought Process:  Coherent and Descriptions of Associations: Intact  Orientation:  Full (Time, Place, and Person)  Thought Content:  WDL and Logical  Suicidal Thoughts:  No  Homicidal Thoughts:  No  Memory:  Immediate;   Good Recent;   Good Remote;   Good  Judgement:  Fair  Insight:  Fair  Psychomotor Activity:  Normal  Concentration:  Concentration: Good and Attention Span: Good  Recall:  Good  Fund of Knowledge:  Good  Language:  Good  Akathisia:  No  Handed:  Right  AIMS (if indicated):     Assets:  Leisure Time Physical Health Resilience Social Support  ADL's:  Intact  Cognition:  WNL  Sleep:        Physical Exam: Physical Exam Vitals and nursing note reviewed.  Constitutional:      Appearance: Normal appearance.  HENT:     Head: Normocephalic and atraumatic.     Mouth/Throat:      Pharynx: Oropharynx is clear.  Eyes:     Pupils: Pupils are equal, round, and reactive to light.  Cardiovascular:     Rate and Rhythm: Normal rate and regular rhythm.  Pulmonary:     Effort: Pulmonary effort is normal.     Breath sounds: Normal breath sounds.  Abdominal:     General: Abdomen is flat.     Palpations: Abdomen is soft.  Musculoskeletal:        General: Normal range of motion.  Skin:    General: Skin is warm and dry.  Neurological:     General: No focal deficit present.     Mental Status: He is alert. Mental status is at baseline.  Psychiatric:        Attention and Perception: Attention normal.        Mood and Affect: Mood normal.        Speech: Speech normal.        Behavior: Behavior is cooperative.        Thought Content: Thought content normal.        Cognition and Memory: Cognition normal.        Judgment: Judgment normal.   Review of Systems  Constitutional: Negative.   HENT: Negative.    Eyes: Negative.   Respiratory: Negative.    Cardiovascular: Negative.   Gastrointestinal: Negative.   Musculoskeletal: Negative.   Skin: Negative.   Neurological: Negative.   Psychiatric/Behavioral: Negative.    Blood pressure 118/76, pulse 77, temperature 98.6 F (37 C), temperature source Oral, resp. rate 17, height 6' (1.829 m), weight 113 kg, SpO2 98 %. Body mass index is 33.79 kg/m.  Treatment Plan Summary:  Schizoaffective disorder, bipolar type: -Continue Depakote 1000 mg BID -Continue Zyprexa 15 mg BID   Agitation medications: -Haldol 5 mg, Ativan  2 mg, and 50 mg of Benadryl IM every 4 hours PRN  Disposition:   [facility] waitlist  Nanine Means, NP 03/30/2021 4:30 PM

## 2021-03-30 NOTE — ED Notes (Signed)
[facility] on [facility] waitlist 

## 2021-03-30 NOTE — ED Notes (Signed)
VS not taken, Patient asleep. 

## 2021-03-30 NOTE — ED Notes (Signed)
Pt received dinner tray. Tech called down to dietary and ordered pt preferred meal this evening as something nice to do for him. Pt told staff that it was so kind and lightened his mood for the day and made him very happy.  Pt eating in room. Pt very calm and so very polite this evening.

## 2021-03-30 NOTE — ED Notes (Signed)
Report to include Situation, Background, Assessment, and Recommendations received from Jeannette RN. Patient alert and oriented, warm and dry, in no acute distress. Patient denies SI, HI, AVH and pain. Patient made aware of Q15 minute rounds and security cameras for their safety. Patient instructed to come to me with needs or concerns.  

## 2021-03-30 NOTE — ED Notes (Signed)
Pt is showering and has received new scrub pants. There were no scrub tops in his side. York Spaniel his was only a day old.  Pt is cooperative and polite.

## 2021-03-30 NOTE — ED Notes (Signed)
Patient up to nursing station requesting shower. Clean clothes and oral hygiene supplies provided.

## 2021-03-30 NOTE — ED Notes (Signed)
Pt received lunch tray and drink  Pt is now resting at this time  Pt has been calm and cooperative so far today. No concerns. No issues.  

## 2021-03-30 NOTE — ED Notes (Signed)
Patient no SI this morning, assessment correct the first time completed. No need to redue, no error noted based on morning assessment.  

## 2021-03-31 MED ORDER — MAGIC MOUTHWASH
15.0000 mL | Freq: Once | ORAL | Status: AC
  Administered 2021-03-31: 15 mL via ORAL
  Filled 2021-03-31: qty 20

## 2021-03-31 NOTE — ED Notes (Signed)
VS not taken, patient asleep 

## 2021-03-31 NOTE — ED Provider Notes (Signed)
Emergency Medicine Observation Re-evaluation Note  Jackson [name] is a 35 y.o. male, seen on rounds today.  Pt initially presented to the ED for complaints of Aggressive Behavior Currently, the patient is awaiting disposition.  Physical Exam  BP 120/60 (BP Location: Right Arm)   Pulse 69   Temp 97.8 F (36.6 C) (Oral)   Resp 17   Ht 6' (1.829 m)   Wt 113 kg   SpO2 98%   BMI 33.79 kg/m  Physical Exam General: calm Cardiac: regular rate Lungs: no respiratory distress Psych: calm  ED Course / MDM   No new labs over the past 24 hours  Plan  Current plan is awaiting disposition. Jackson [name] is under involuntary commitment.      [name], MD 03/31/21 [phone]

## 2021-03-31 NOTE — ED Notes (Signed)
Hourly rounding reveals patient in room. No complaints, stable, in no acute distress. Q15 minute rounds and monitoring via Security Cameras to continue. 

## 2021-03-31 NOTE — BH Assessment (Signed)
Writer contacted [facility] and spoke with ([phone]), patient remains on wait list

## 2021-03-31 NOTE — ED Notes (Signed)
Snack and beverage given. 

## 2021-03-31 NOTE — ED Notes (Signed)
Report to include Situation, Background, Assessment, and Recommendations received from Jeannette RN. Patient alert and oriented, warm and dry, in no acute distress. Patient denies SI, HI, AVH and pain. Patient made aware of Q15 minute rounds and security cameras for their safety. Patient instructed to come to me with needs or concerns.  

## 2021-03-31 NOTE — BH Assessment (Signed)
Writer contacted [facility] and spoke with ([phone]), patient remains on wait list 

## 2021-04-01 NOTE — ED Notes (Signed)
IVC pending placement 

## 2021-04-01 NOTE — ED Notes (Signed)
IVC/Pending Placement 

## 2021-04-01 NOTE — ED Notes (Signed)
Hourly rounding reveals patient in room. No complaints, stable, in no acute distress. Q15 minute rounds and monitoring via Security Cameras to continue. 

## 2021-04-01 NOTE — ED Notes (Signed)
Dinner given to pt 

## 2021-04-01 NOTE — ED Provider Notes (Signed)
Emergency Medicine Observation Re-evaluation Note  Jackson [name] is a 35 y.o. male, seen on rounds today.  Pt initially presented to the ED for complaints of Aggressive Behavior Currently, the patient is resting.  Physical Exam  BP 119/74 (BP Location: Right Arm)   Pulse 73   Temp 98.6 F (37 C) (Oral)   Resp 18   Ht 6' (1.829 m)   Wt 113 kg   SpO2 98%   BMI 33.79 kg/m  Physical Exam General: NAd Cardiac: well perfused Lungs: unlabored Psych: resting  ED Course / MDM  EKG:EKG Interpretation  Date/Time:  Wednesday February 13 2021 16:33:36 EDT Ventricular Rate:  76 PR Interval:  192 QRS Duration: 74 QT Interval:  354 QTC Calculation: 398 R Axis:   48 Text Interpretation: Normal sinus rhythm Junctional ST depression, probably normal Borderline ECG ----------unconfirmed---------- Confirmed by UNCONFIRMED, DOCTOR ([id number]), editor Tamsen Snider [phone]) on 02/15/2021 12:32:46 PM  I have reviewed the labs performed to date as well as medications administered while in observation.  Recent changes in the last 24 hours include .  Plan  Current plan is for psych dispo. Jackson [name] is under involuntary commitment.      Willy Eddy, MD 04/01/21 [phone]

## 2021-04-01 NOTE — ED Notes (Signed)
Breakfast tray given to pt 

## 2021-04-01 NOTE — ED Notes (Signed)
VS not taken, patient asleep 

## 2021-04-01 NOTE — BH Assessment (Signed)
Writer contacted [facility] and spoke with ([phone]), patient remains on wait list. 

## 2021-04-02 NOTE — ED Provider Notes (Signed)
Emergency Medicine Observation Re-evaluation Note  Jackson [name] is a 35 y.o. male, seen on rounds today.  Pt initially presented to the ED for complaints of Aggressive Behavior Currently, the patient is resting.  Physical Exam  BP 121/63 (BP Location: Left Arm)   Pulse 70   Temp 98.8 F (37.1 C) (Oral)   Resp 19   Ht 6' (1.829 m)   Wt 113 kg   SpO2 97%   BMI 33.79 kg/m  Physical Exam General: nad Cardiac: not tachycardic Lungs: unlabored Psych: currently calm  ED Course / MDM  EKG:EKG Interpretation  Date/Time:  Wednesday February 13 2021 16:33:36 EDT Ventricular Rate:  76 PR Interval:  192 QRS Duration: 74 QT Interval:  354 QTC Calculation: 398 R Axis:   48 Text Interpretation: Normal sinus rhythm Junctional ST depression, probably normal Borderline ECG ----------unconfirmed---------- Confirmed by UNCONFIRMED, DOCTOR ([id number]), editor Tamsen Snider [phone]) on 02/15/2021 12:32:46 PM  I have reviewed the labs performed to date as well as medications administered while in observation.  Recent changes in the last 24 hours include none.  Plan  Current plan is for psych eval. Jackson [name] is under involuntary commitment.      Jackson Eddy, MD 04/02/21 [phone]

## 2021-04-02 NOTE — BH Assessment (Addendum)
Writer contacted [facility] and spoke with (Pam-[phone]), patient remains on wait list. 

## 2021-04-02 NOTE — ED Notes (Signed)
Pt resting in his bed. Pt recently used restroom and is back in his bed. Pt asked if he needed anything and nothing needed at this time

## 2021-04-02 NOTE — ED Notes (Signed)
Breakfast tray given. °

## 2021-04-02 NOTE — ED Notes (Signed)
Pt given the phone.   

## 2021-04-02 NOTE — ED Notes (Signed)
Lunch tray given. 

## 2021-04-03 NOTE — ED Notes (Signed)
Declined shower. 

## 2021-04-03 NOTE — ED Notes (Signed)
Resumed care from ally rn.  Pt up to bathroom.  Pt calm and cooperative

## 2021-04-03 NOTE — ED Provider Notes (Signed)
Emergency Medicine Observation Re-evaluation Note  Jackson [name] is a 36 y.o. male, seen on rounds today.  Pt initially presented to the ED for complaints of Aggressive Behavior Currently, the patient is resting.  He is lying in his bed, television on, in no distress.  Reports he feels fine does not have any concerns this morning.  Physical Exam  BP 126/90 (BP Location: Right Arm)   Pulse 80   Temp 98.2 F (36.8 C) (Oral)   Resp 14   Ht 6' (1.829 m)   Wt 113 kg   SpO2 97%   BMI 33.79 kg/m  Physical Exam General: nad Cardiac: not tachycardic Lungs: unlabored Psych: currently calm resting in his bed without difficulty  ED Course / MDM    I have reviewed the labs performed to date as well as medications administered while in observation.  Recent changes in the last 24 hours include none.  Plan  Current plan is for psych eval. Jackson [name] is under involuntary commitment.     Sharyn Creamer, MD 04/03/21 1201

## 2021-04-03 NOTE — ED Notes (Signed)
Unlocked bathroom door to allow patient to use restroom. Pt exited and bathroom door locked behind patient by staff.  

## 2021-04-03 NOTE — ED Notes (Signed)
Pt. Was given his snack. Pt got a sandwich tray and 2 crackers and orange juice and 2 ice creams.

## 2021-04-03 NOTE — BH Assessment (Signed)
Writer contacted [facility] and spoke with ([phone]), patient remains on wait list 

## 2021-04-03 NOTE — ED Notes (Signed)
This nurse assists to clean up patients room as he has piles of trash in room

## 2021-04-03 NOTE — ED Notes (Signed)
Report received from Amy, RN including SBAR. Patient alert and oriented, warm and dry, in no acute distress. Patient denies SI, HI, AVH and pain. Patient made aware of Q15 minute rounds and security cameras for their safety. Patient instructed to come to this nurse with needs or concerns.  

## 2021-04-03 NOTE — ED Notes (Addendum)
Given breakfast tray, ate 100% of it.

## 2021-04-03 NOTE — ED Notes (Signed)
Pt to restroom at this time.

## 2021-04-03 NOTE — ED Notes (Signed)
Pt eating dinner tray °

## 2021-04-03 NOTE — ED Notes (Signed)
It was noted that patient vitals prior to this nurse arrival were out of normal range. Recollected at this time

## 2021-04-03 NOTE — ED Notes (Signed)
PT PLACED  UNDER  8TH  SET OF  IVC  PAPERS  ON  [facility] WAITLIST

## 2021-04-03 NOTE — ED Notes (Signed)
Dinner tray given to patient

## 2021-04-04 NOTE — Progress Notes (Signed)
[facility] MD Progress Note  04/04/2021 12:14 PM Jackson [name]  MRN:  [id number]  CC "My two favorite people!"  Subjective:  Follow-up for patient admitted to hospital under IVC for acute psychosis. Patient currently at his baseline, calm, cooperative, and compliant with medications. He has a legal guardian who desire transfer to state hospital. He remains on [facility] waitlist.  Principal Problem: Schizoaffective disorder, bipolar type (HCC) Diagnosis: Principal Problem:   Schizoaffective disorder, bipolar type (HCC)  Total Time spent with patient: 30 minutes  Past Psychiatric History: See H&P  Past Medical History:  Past Medical History:  Diagnosis Date   Bipolar 1 disorder (HCC)    Schizoaffective disorder (HCC)    History reviewed. No pertinent surgical history. Family History: History reviewed. No pertinent family history. Family Psychiatric  History: See H&P Social History:  Social History   Substance and Sexual Activity  Alcohol Use Not Currently     Social History   Substance and Sexual Activity  Drug Use Not Currently    Social History   Socioeconomic History   Marital status: Single    Spouse name: Not on file   Number of children: Not on file   Years of education: Not on file   Highest education level: Not on file  Occupational History   Not on file  Tobacco Use   Smoking status: Light Smoker    Packs/day: 0.25    Types: Cigarettes   Smokeless tobacco: Never  Vaping Use   Vaping Use: Never used  Substance and Sexual Activity   Alcohol use: Not Currently   Drug use: Not Currently   Sexual activity: Not on file  Other Topics Concern   Not on file  Social History Narrative   Not on file   Social Determinants of Health   Financial Resource Strain: Not on file  Food Insecurity: Not on file  Transportation Needs: Not on file  Physical Activity: Not on file  Stress: Not on file  Social Connections: Not on file   Additional Social History:                          Sleep: Good  Appetite:  Good  Current Medications: Current Facility-Administered Medications  Medication Dose Route Frequency Provider Last Rate Last Admin   acetaminophen (TYLENOL) tablet 650 mg  650 mg Oral Q6H PRN Ward, Kristen N, DO   650 mg at 03/17/21 0050   diphenhydrAMINE (BENADRYL) injection 50 mg  50 mg Intramuscular Q4H PRN [name], [name], MD   50 mg at 02/18/21 1453   divalproex (DEPAKOTE) DR tablet 1,000 mg  1,000 mg Oral Q12H [name], [name], MD   1,000 mg at 04/04/21 0944   haloperidol lactate (HALDOL) injection 5 mg  5 mg Intramuscular Q4H PRN [name], [name], MD   5 mg at 02/18/21 1453   ibuprofen (ADVIL) tablet 600 mg  600 mg Oral Q6H PRN Ward, Kristen N, DO       LORazepam (ATIVAN) injection 2 mg  2 mg Intramuscular Q4H PRN [name], [name], MD   2 mg at 02/18/21 1454   menthol-cetylpyridinium (CEPACOL) lozenge 3 mg  1 lozenge Oral PRN Ward, Kristen N, DO   3 mg at 03/17/21 0214   OLANZapine zydis (ZYPREXA) disintegrating tablet 15 mg  15 mg Oral BID Foye Deer, RPH   15 mg at 04/04/21 4696   Current Outpatient Medications  Medication Sig Dispense Refill   ARIPiprazole (ABILIFY)  15 MG tablet Take 1 tablet (15 mg total) by mouth daily for 8 days. 8 tablet 0   ARIPiprazole ER (ABILIFY MAINTENA) 400 MG SRER injection Inject 2 mLs (400 mg total) into the muscle every 28 (twenty-eight) days. (Patient not taking: Reported on 02/14/2021) 1 each    chlorproMAZINE (THORAZINE) 50 MG tablet Take 5 tablets (250 mg total) by mouth 3 (three) times daily. (Patient not taking: Reported on 02/14/2021) 90 tablet 0   divalproex (DEPAKOTE) 500 MG DR tablet Take 2 tablets (1,000 mg total) by mouth every 12 (twelve) hours. (Patient not taking: Reported on 02/14/2021) 120 tablet 1   OLANZapine zydis (ZYPREXA) 15 MG disintegrating tablet Take 2 tablets (30 mg total) by mouth at bedtime. (Patient not taking: Reported on 02/14/2021) 60 tablet 0    Lab Results: No results found  for this or any previous visit (from the past 48 hour(s)).  Blood Alcohol level:  Lab Results  Component Value Date   ETH <10 02/13/2021   ETH <10 01/20/2021    Metabolic Disorder Labs: Lab Results  Component Value Date   HGBA1C 5.2 10/23/2018   MPG 102.54 10/23/2018   No results found for: PROLACTIN Lab Results  Component Value Date   CHOL 129 10/23/2018   TRIG 58 10/23/2018   HDL 50 10/23/2018   CHOLHDL 2.6 10/23/2018   VLDL 12 10/23/2018   LDLCALC 67 10/23/2018    Physical Findings: AIMS:  , ,  ,  ,    CIWA:    COWS:     Musculoskeletal: Strength & Muscle Tone: within normal limits Gait & Station: normal Patient leans: N/A  Psychiatric Specialty Exam:  Presentation  General Appearance: Casual; Appropriate for Environment  Eye Contact:Good  Speech:Normal Rate; Clear and Coherent  Speech Volume:Normal  Handedness:Right   Mood and Affect  Mood:Euthymic  Affect:Congruent   Thought Process  Thought Processes:Goal Directed; Coherent  Descriptions of Associations:Intact  Orientation:Full (Time, Place and Person)  Thought Content:Logical  History of Schizophrenia/Schizoaffective disorder:Yes  Duration of Psychotic Symptoms:Greater than six months  Hallucinations:Hallucinations: None  Ideas of Reference:None  Suicidal Thoughts:Suicidal Thoughts: No  Homicidal Thoughts:Homicidal Thoughts: No   Sensorium  Memory:Immediate Fair; Recent Fair; Remote Fair  Judgment:Intact  Insight:Shallow   Executive Functions  Concentration:Fair  Attention Span:Fair  Recall:Fair  Fund of Knowledge:Fair  Language:Fair   Psychomotor Activity  Psychomotor Activity:Psychomotor Activity: Normal   Assets  Assets:Communication Skills; Desire for Improvement; Financial Resources/Insurance; Social Support   Sleep  Sleep:Sleep: Good    Physical Exam: Physical Exam ROS Blood pressure 103/74, pulse 85, temperature 98.2 F (36.8 C),  temperature source Oral, resp. rate 20, height 6' (1.829 m), weight 113 kg, SpO2 95 %. Body mass index is 33.79 kg/m.   Treatment Plan Summary: Daily contact with patient to assess and evaluate symptoms and progress in treatment and Medication management 35 year old male who presented under IVC for psychosis. Guardian requests transfer to state hospital. Remains on [facility] waitlist. Continue medications as above.   [name], MD 04/04/2021, 12:14 PM

## 2021-04-04 NOTE — BH Assessment (Signed)
Writer contacted [facility] and spoke with (Ms. Jones-[phone]), patient remains on wait list. 

## 2021-04-04 NOTE — ED Notes (Addendum)
Lunch meal tray given at this time.  

## 2021-04-04 NOTE — ED Notes (Signed)
Hourly rounding reveals patient in room. No complaints, stable, in no acute distress. Q15 minute rounds and monitoring via Security Cameras to continue. 

## 2021-04-04 NOTE — BH Assessment (Signed)
Writer contacted [facility] and spoke with ([phone]), patient remains on wait list. 

## 2021-04-04 NOTE — ED Notes (Signed)
Snack and beverage given. 

## 2021-04-04 NOTE — ED Notes (Signed)
Dinner tray given at this time.  

## 2021-04-04 NOTE — ED Notes (Signed)
Report to include Situation, Background, Assessment, and Recommendations received from Ashley RN. Patient alert and oriented, warm and dry, in no acute distress. Patient denies SI, HI, AVH and pain. Patient made aware of Q15 minute rounds and security cameras for their safety. Patient instructed to come to me with needs or concerns.  

## 2021-04-04 NOTE — ED Notes (Signed)
Pt is ambulatory to the restroom with no assistance required.

## 2021-04-04 NOTE — ED Provider Notes (Signed)
Emergency Medicine Observation Re-evaluation Note  Jackson [name] is a 35 y.o. male, initially presented to the ED for complaints of Aggressive Behavior No acute events overnight.  [facility] to [facility].  Physical Exam  BP 103/74 (BP Location: Left Arm)   Pulse 85   Temp 98.2 F (36.8 C) (Oral)   Resp 20   Ht 6' (1.829 m)   Wt 113 kg   SpO2 95%   BMI 33.79 kg/m   No acute events overnight ED Course / MDM  EKG:EKG Interpretation  Date/Time:  Wednesday February 13 2021 16:33:36 EDT Ventricular Rate:  76 PR Interval:  192 QRS Duration: 74 QT Interval:  354 QTC Calculation: 398 R Axis:   48 Text Interpretation: Normal sinus rhythm Junctional ST depression, probably normal Borderline ECG ----------unconfirmed---------- Confirmed by UNCONFIRMED, DOCTOR ([id number]), editor Tamsen Snider [phone]) on 02/15/2021 12:32:46 PM  No new lab work for review.  Plan  Current plan is for placement at [facility] once a bed is available.Jackson [name] is under involuntary commitment.      Minna Antis, MD 04/04/21 1459

## 2021-04-05 NOTE — ED Provider Notes (Signed)
Emergency Medicine Observation Re-evaluation Note  Jackson [name] is a 35 y.o. male, seen on rounds today.  Pt initially presented to the ED for complaints of Aggressive Behavior Currently, the patient is laying in bed, denies any complaints.  Physical Exam  BP (!) 104/55 (BP Location: Right Arm)   Pulse 70   Temp 99.1 F (37.3 C) (Oral)   Resp 18   Ht 6' (1.829 m)   Wt 113 kg   SpO2 99%   BMI 33.79 kg/m  Physical Exam Constitutional: Resting comfortably. Eyes: Conjunctivae are normal. Head: Atraumatic. Nose: No congestion/rhinnorhea. Mouth/Throat: Mucous membranes are moist. Neck: Normal ROM Cardiovascular: No cyanosis noted. Respiratory: Normal respiratory effort. Gastrointestinal: Non-distended. Genitourinary: deferred Musculoskeletal: No lower extremity tenderness nor edema. Neurologic:  Normal speech and language. No gross focal neurologic deficits are appreciated. Skin:  Skin is warm, dry and intact. No rash noted.   ED Course / MDM  EKG:EKG Interpretation  Date/Time:  Wednesday February 13 2021 16:33:36 EDT Ventricular Rate:  76 PR Interval:  192 QRS Duration: 74 QT Interval:  354 QTC Calculation: 398 R Axis:   48 Text Interpretation: Normal sinus rhythm Junctional ST depression, probably normal Borderline ECG ----------unconfirmed---------- Confirmed by UNCONFIRMED, DOCTOR ([id number]), editor Tamsen Snider [phone]) on 02/15/2021 12:32:46 PM  I have reviewed the labs performed to date as well as medications administered while in observation.  Recent changes in the last 24 hours include none.  Plan  Current plan is for psychiatric admission to [facility] Jackson [name] is under involuntary commitment.      Chesley Noon, MD 04/05/21 1309

## 2021-04-05 NOTE — ED Notes (Signed)
[facility]C [facility] list

## 2021-04-05 NOTE — ED Notes (Signed)
VS not taken, patient asleep 

## 2021-04-05 NOTE — ED Notes (Signed)
Hourly rounding reveals patient in room. No complaints, stable, in no acute distress. Q15 minute rounds and monitoring via Security Cameras to continue. 

## 2021-04-05 NOTE — ED Notes (Signed)
Unlocked bathroom door for pt use, this writer locked door after pt exited.

## 2021-04-05 NOTE — ED Notes (Signed)
Unlocked bathroom door for pt to use restroom, this writer locked bathroom after pt exited. 

## 2021-04-05 NOTE — ED Notes (Signed)
Breakfast tray given to pt 

## 2021-04-05 NOTE — ED Notes (Signed)
Unlocked bathroom door for pt to use, this writer locked door after pt exited.

## 2021-04-05 NOTE — ED Notes (Signed)
Pt given snacks at this time

## 2021-04-05 NOTE — ED Notes (Signed)
Pt given dinner meal, pt requesting a grilled chicken caesar salad, request sent to dining services

## 2021-04-05 NOTE — ED Notes (Signed)
Pt given lunch tray.

## 2021-04-05 NOTE — BH Assessment (Signed)
Writer contacted [facility] and spoke with ([phone]), patient remains on wait list.

## 2021-04-06 MED ORDER — TRAZODONE HCL 50 MG PO TABS
25.0000 mg | ORAL_TABLET | Freq: Every day | ORAL | Status: DC
  Administered 2021-04-09 – 2021-05-05 (×13): 25 mg via ORAL
  Filled 2021-04-06: qty 1
  Filled 2021-04-06 (×5): qty 0.5
  Filled 2021-04-06 (×2): qty 1
  Filled 2021-04-06: qty 0.5
  Filled 2021-04-06: qty 1
  Filled 2021-04-06: qty 0.5
  Filled 2021-04-06: qty 1
  Filled 2021-04-06: qty 0.5
  Filled 2021-04-06: qty 1
  Filled 2021-04-06: qty 0.5
  Filled 2021-04-06: qty 1
  Filled 2021-04-06: qty 0.5
  Filled 2021-04-06 (×3): qty 1
  Filled 2021-04-06: qty 0.5
  Filled 2021-04-06: qty 1
  Filled 2021-04-06 (×3): qty 0.5
  Filled 2021-04-06 (×2): qty 1
  Filled 2021-04-06 (×2): qty 0.5

## 2021-04-06 NOTE — ED Notes (Signed)
Pt given lunch tray.

## 2021-04-06 NOTE — ED Notes (Signed)
Pt up to bathroom.

## 2021-04-06 NOTE — BH Assessment (Signed)
Writer contacted [facility] and spoke with ([phone]), patient remains on wait list.

## 2021-04-06 NOTE — ED Notes (Signed)
Snack and beverage given. 

## 2021-04-06 NOTE — ED Provider Notes (Signed)
Emergency Medicine Observation Re-evaluation Note  Jackson [name] is a 35 y.o. male, seen on rounds today.  Pt initially presented to the ED for complaints of Aggressive Behavior Currently, the patient is resting comfortably in bed.  Physical Exam  BP 115/74   Pulse 74   Temp 99.1 F (37.3 C) (Oral)   Resp 16   Ht 6' (1.829 m)   Wt 113 kg   SpO2 97%   BMI 33.79 kg/m  Physical Exam Constitutional:      Appearance: He is not ill-appearing or toxic-appearing.  HENT:     Head: Atraumatic.  Cardiovascular:     Comments: Appears well perfused Pulmonary:     Effort: Pulmonary effort is normal.  Abdominal:     General: There is no distension.  Musculoskeletal:        General: No deformity.  Neurological:     General: No focal deficit present.     ED Course / MDM  EKG:EKG Interpretation  Date/Time:  Wednesday February 13 2021 16:33:36 EDT Ventricular Rate:  76 PR Interval:  192 QRS Duration: 74 QT Interval:  354 QTC Calculation: 398 R Axis:   48 Text Interpretation: Normal sinus rhythm Junctional ST depression, probably normal Borderline ECG ----------unconfirmed---------- Confirmed by UNCONFIRMED, DOCTOR ([id number]), editor Tamsen Snider [phone]) on 02/15/2021 12:32:46 PM  I have reviewed the labs performed to date as well as medications administered while in observation.  Recent changes in the last 24 hours include none.  Plan  Current plan is for placement. Jackson [name] is under involuntary commitment.      Delton Prairie, MD 04/06/21 [phone]

## 2021-04-06 NOTE — ED Notes (Signed)
IVC/[facility] waitlist 

## 2021-04-06 NOTE — ED Notes (Signed)
Pt given the phone.   

## 2021-04-06 NOTE — ED Notes (Signed)
Pt given clean scrubs and underwear.

## 2021-04-06 NOTE — ED Notes (Signed)
Hourly rounding reveals patient in room. No complaints, stable, in no acute distress. Q15 minute rounds and monitoring via Security Cameras to continue. 

## 2021-04-06 NOTE — Consult Note (Signed)
[facility] Face-to-Face Psychiatry Consult   Reason for Consult: Follow-up consult 35 year old man with schizoaffective disorder. Referring Physician: EDP Patient Identification: Jackson [name] MRN:  [id number] Principal Diagnosis: Schizoaffective disorder, bipolar type (HCC) Diagnosis:  Principal Problem:   Schizoaffective disorder, bipolar type (HCC)   Total Time spent with patient: 30 minutes  Subjective:   Jackson [name] is a 35 y.o. male patient admitted with mania and instability of his schizoaffective disorder, awaiting [facility] placement as his guardian, his mother, wants him to go there.  HPI: Follow-up evaluation on patient.  Patient is at his baseline, calm and compliant with his medications, no issues on the unit.  Today, he has some depression as he voices frustration that his step-father threw some things out of his apartment that he has to vacate.  He states he has four days to get what he wants from the apartment or the management there will throw it out and clean the apartment.  Jackson [name] states he does not have a new place to live and has not talked to his mother, his guardian, recently.  Denies side effects from his medications, stays in his room most of the time except to go to the bathroom and shower.  He does voice some issues with sleep initiation and Trazodone PRN placed to assist as needed.  Unfortunately, when he is discharged he stops taking his medications - becomes unstable and violent - and returns to the hospital.  Awaiting [facility] admission.   Past Psychiatric History: Multiple hospitalizations just in the past year due to noncompliance with medicine  Risk to Self:  none Risk to Others:  none Prior Inpatient Therapy:  multiple Prior Outpatient Therapy:  [facility] team  Past Medical History:  Past Medical History:  Diagnosis Date   Bipolar 1 disorder (HCC)    Schizoaffective disorder (HCC)    History reviewed. No pertinent surgical history. Family History: History reviewed.  No pertinent family history. Family Psychiatric  History: See previous Social History:  Social History   Substance and Sexual Activity  Alcohol Use Not Currently     Social History   Substance and Sexual Activity  Drug Use Not Currently    Social History   Socioeconomic History   Marital status: Single    Spouse name: Not on file   Number of children: Not on file   Years of education: Not on file   Highest education level: Not on file  Occupational History   Not on file  Tobacco Use   Smoking status: Light Smoker    Packs/day: 0.25    Types: Cigarettes   Smokeless tobacco: Never  Vaping Use   Vaping Use: Never used  Substance and Sexual Activity   Alcohol use: Not Currently   Drug use: Not Currently   Sexual activity: Not on file  Other Topics Concern   Not on file  Social History Narrative   Not on file   Social Determinants of Health   Financial Resource Strain: Not on file  Food Insecurity: Not on file  Transportation Needs: Not on file  Physical Activity: Not on file  Stress: Not on file  Social Connections: Not on file   Additional Social History:    Allergies:  No Known Allergies  Labs: No results found for this or any previous visit (from the past 48 hour(s)).  Current Facility-Administered Medications  Medication Dose Route Frequency Provider Last Rate Last Admin   acetaminophen (TYLENOL) tablet 650 mg  650 mg Oral Q6H PRN Ward, Layla Maw,  DO   650 mg at 03/17/21 0050   diphenhydrAMINE (BENADRYL) injection 50 mg  50 mg Intramuscular Q4H PRN [name], [name], MD   50 mg at 02/18/21 1453   divalproex (DEPAKOTE) DR tablet 1,000 mg  1,000 mg Oral Q12H [name], [name], MD   1,000 mg at 04/06/21 0930   haloperidol lactate (HALDOL) injection 5 mg  5 mg Intramuscular Q4H PRN [name], [name], MD   5 mg at 02/18/21 1453   ibuprofen (ADVIL) tablet 600 mg  600 mg Oral Q6H PRN Ward, Kristen N, DO       LORazepam (ATIVAN) injection 2 mg  2 mg Intramuscular Q4H  PRN [name], [name], MD   2 mg at 02/18/21 1454   menthol-cetylpyridinium (CEPACOL) lozenge 3 mg  1 lozenge Oral PRN Ward, Kristen N, DO   3 mg at 03/17/21 0214   OLANZapine zydis (ZYPREXA) disintegrating tablet 15 mg  15 mg Oral BID Foye Deer, RPH   15 mg at 04/06/21 3664   Current Outpatient Medications  Medication Sig Dispense Refill   ARIPiprazole (ABILIFY) 15 MG tablet Take 1 tablet (15 mg total) by mouth daily for 8 days. 8 tablet 0   ARIPiprazole ER (ABILIFY MAINTENA) 400 MG SRER injection Inject 2 mLs (400 mg total) into the muscle every 28 (twenty-eight) days. (Patient not taking: Reported on 02/14/2021) 1 each    chlorproMAZINE (THORAZINE) 50 MG tablet Take 5 tablets (250 mg total) by mouth 3 (three) times daily. (Patient not taking: Reported on 02/14/2021) 90 tablet 0   divalproex (DEPAKOTE) 500 MG DR tablet Take 2 tablets (1,000 mg total) by mouth every 12 (twelve) hours. (Patient not taking: Reported on 02/14/2021) 120 tablet 1   OLANZapine zydis (ZYPREXA) 15 MG disintegrating tablet Take 2 tablets (30 mg total) by mouth at bedtime. (Patient not taking: Reported on 02/14/2021) 60 tablet 0    Musculoskeletal: Strength & Muscle Tone: within normal limits Gait & Station: normal Patient leans: N/A  Psychiatric Specialty Exam: Physical Exam Vitals and nursing note reviewed.  Constitutional:      Appearance: Normal appearance.  HENT:     Head: Normocephalic and atraumatic.     Mouth/Throat:     Pharynx: Oropharynx is clear.  Eyes:     Pupils: Pupils are equal, round, and reactive to light.  Cardiovascular:     Rate and Rhythm: Normal rate and regular rhythm.  Pulmonary:     Effort: Pulmonary effort is normal.     Breath sounds: Normal breath sounds.  Abdominal:     General: Abdomen is flat.     Palpations: Abdomen is soft.  Musculoskeletal:        General: Normal range of motion.  Skin:    General: Skin is warm and dry.  Neurological:     General: No focal deficit  present.     Mental Status: He is alert. Mental status is at baseline.  Psychiatric:        Attention and Perception: Attention normal.        Mood and Affect: Mood is depressed.        Speech: Speech normal.        Behavior: Behavior is cooperative.        Thought Content: Thought content normal.        Cognition and Memory: Cognition normal.        Judgment: Judgment normal.    Review of Systems  Constitutional: Negative.   HENT: Negative.  Eyes: Negative.   Respiratory: Negative.    Cardiovascular: Negative.   Gastrointestinal: Negative.   Musculoskeletal: Negative.   Skin: Negative.   Neurological: Negative.   Psychiatric/Behavioral:  Positive for depression. The patient has insomnia.    Blood pressure 122/79, pulse 78, temperature 98.6 F (37 C), temperature source Oral, resp. rate 18, height 6' (1.829 m), weight 113 kg, SpO2 98 %.Body mass index is 33.79 kg/m.  General Appearance: Casual  Eye Contact:  Good  Speech:  Normal Rate  Volume:  Normal  Mood:  Euthymic  Affect:  Congruent  Thought Process:  Coherent and Descriptions of Associations: Intact  Orientation:  Full (Time, Place, and Person)  Thought Content:  WDL and Logical  Suicidal Thoughts:  No  Homicidal Thoughts:  No  Memory:  Immediate;   Good Recent;   Good Remote;   Good  Judgement:  Fair  Insight:  Fair  Psychomotor Activity:  Normal  Concentration:  Concentration: Good and Attention Span: Good  Recall:  Good  Fund of Knowledge:  Good  Language:  Good  Akathisia:  No  Handed:  Right  AIMS (if indicated):     Assets:  Leisure Time Physical Health Resilience Social Support  ADL's:  Intact  Cognition:  WNL  Sleep:        Physical Exam: Physical Exam Vitals and nursing note reviewed.  Constitutional:      Appearance: Normal appearance.  HENT:     Head: Normocephalic and atraumatic.     Mouth/Throat:     Pharynx: Oropharynx is clear.  Eyes:     Pupils: Pupils are equal, round, and  reactive to light.  Cardiovascular:     Rate and Rhythm: Normal rate and regular rhythm.  Pulmonary:     Effort: Pulmonary effort is normal.     Breath sounds: Normal breath sounds.  Abdominal:     General: Abdomen is flat.     Palpations: Abdomen is soft.  Musculoskeletal:        General: Normal range of motion.  Skin:    General: Skin is warm and dry.  Neurological:     General: No focal deficit present.     Mental Status: He is alert. Mental status is at baseline.  Psychiatric:        Attention and Perception: Attention normal.        Mood and Affect: Mood is depressed.        Speech: Speech normal.        Behavior: Behavior is cooperative.        Thought Content: Thought content normal.        Cognition and Memory: Cognition normal.        Judgment: Judgment normal.   Review of Systems  Constitutional: Negative.   HENT: Negative.    Eyes: Negative.   Respiratory: Negative.    Cardiovascular: Negative.   Gastrointestinal: Negative.   Musculoskeletal: Negative.   Skin: Negative.   Neurological: Negative.   Psychiatric/Behavioral:  Positive for depression. The patient has insomnia.   Blood pressure 122/79, pulse 78, temperature 98.6 F (37 C), temperature source Oral, resp. rate 18, height 6' (1.829 m), weight 113 kg, SpO2 98 %. Body mass index is 33.79 kg/m.  Treatment Plan Summary:  Schizoaffective disorder, bipolar type: -Continue Depakote 1000 mg BID -Continue Zyprexa 15 mg BID   Agitation medications: -Haldol 5 mg, Ativan 2 mg, and 50 mg of Benadryl IM every 4 hours PRN  Insomnia: -Started Trazodone 25 mg  at  bedtime PRN, may repeat once in an hour if not effective  Disposition:   [facility] waitlist  Nanine Means, NP 04/06/2021 11:44 AM

## 2021-04-06 NOTE — ED Notes (Signed)
Pt made a phone call and then up to the bathroom.

## 2021-04-06 NOTE — ED Notes (Signed)
IVC, pending placement [facility] waitlist 

## 2021-04-06 NOTE — ED Notes (Signed)
Report to include Situation, Background, Assessment, and Recommendations received from Amy RN. Patient alert and oriented, warm and dry, in no acute distress. Patient denies SI, HI, AVH and pain. Patient made aware of Q15 minute rounds and security cameras for their safety. Patient instructed to come to me with needs or concerns.  

## 2021-04-06 NOTE — ED Notes (Signed)
Pt given dinner tray.

## 2021-04-07 NOTE — ED Notes (Signed)
Hourly rounding reveals patient in room. No complaints, stable, in no acute distress. Q15 minute rounds and monitoring via Security Cameras to continue. 

## 2021-04-07 NOTE — ED Notes (Signed)
Pt given lunch tray.

## 2021-04-07 NOTE — ED Notes (Signed)
Pt sleeping unable to obtain vital signs.Will get vitals when pt is awake.

## 2021-04-07 NOTE — ED Notes (Signed)
Snack and beverage given. 

## 2021-04-07 NOTE — ED Notes (Signed)
Pt given dinner tray.

## 2021-04-07 NOTE — ED Notes (Signed)
IVC pending placement 

## 2021-04-07 NOTE — ED Notes (Signed)
VS not taken, patient asleep 

## 2021-04-07 NOTE — BH Assessment (Signed)
Writer contacted [facility] and spoke with [phone]), patient remains on wait list.

## 2021-04-07 NOTE — ED Notes (Signed)
Pt given breakfast tray

## 2021-04-07 NOTE — ED Notes (Signed)
Report to include Situation, Background, Assessment, and Recommendations received from Amy RN. Patient alert and oriented, warm and dry, in no acute distress. Patient denies SI, HI, AVH and pain. Patient made aware of Q15 minute rounds and security cameras for their safety. Patient instructed to come to me with needs or concerns.  

## 2021-04-08 NOTE — ED Notes (Signed)
Hourly rounding reveals patient in room. No complaints, stable, in no acute distress. Q15 minute rounds and monitoring via Security Cameras to continue. 

## 2021-04-08 NOTE — ED Notes (Signed)
Unlocked bathroom door to allow patient to use restroom. Pt exited and bathroom door locked behind patient by staff.  

## 2021-04-08 NOTE — ED Notes (Signed)
Pt given lunch

## 2021-04-08 NOTE — ED Provider Notes (Signed)
Emergency Medicine Observation Re-evaluation Note  Zymiere Foote is a 35 y.o. male, seen on rounds today.  Pt initially presented to the ED for complaints of Aggressive Behavior Currently, the patient is sleeping comfortably.  Physical Exam  BP 110/85 (BP Location: Left Arm)   Pulse 74   Temp 98.3 F (36.8 C) (Oral)   Resp 18   Ht 6' (1.829 m)   Wt 113 kg   SpO2 98%   BMI 33.79 kg/m  Physical Exam General: No acute distress Lungs: Respirations even and unlabored Psych: No agitation at this time  ED Course / MDM   I have reviewed the labs performed to date as well as medications administered while in observation.  Recent changes in the last 24 hours include no changes.  Plan  Current plan is for inpatient psychiatric admission. Cashton Enge is under involuntary commitment.      Georga Hacking, MD 04/08/21 [phone]

## 2021-04-08 NOTE — ED Notes (Signed)
Pt went to bathroom.  Pt exited bathroom. Pt given night time snack tray and drink.  

## 2021-04-08 NOTE — ED Notes (Signed)
Pt used phone  °

## 2021-04-08 NOTE — ED Notes (Signed)
VS assessed. Dinner tray given. No other needs found.  

## 2021-04-08 NOTE — ED Notes (Signed)
IVC  [facility]  WAITLIST 

## 2021-04-08 NOTE — ED Notes (Signed)
Pt using phone  

## 2021-04-08 NOTE — BH Assessment (Signed)
Writer contacted [facility] and spoke with (Amore-[phone]), patient remains on wait list. 

## 2021-04-08 NOTE — BH Assessment (Addendum)
Writer contacted [facility] and spoke with ([phone]), patient remains on wait list. 

## 2021-04-08 NOTE — ED Notes (Signed)
VS not taken, patient asleep 

## 2021-04-08 NOTE — ED Notes (Signed)
Declined shower. 

## 2021-04-09 DIAGNOSIS — F250 Schizoaffective disorder, bipolar type: Secondary | ICD-10-CM | POA: Diagnosis not present

## 2021-04-09 MED ORDER — NON FORMULARY: 300.0000 mg | Status: DC

## 2021-04-09 NOTE — ED Notes (Signed)
Report received from Mimi RN including SBAR. Patient alert and oriented, warm and dry, in no acute distress. Patient denies SI, HI, AVH and pain. Patient made aware of Q15 minute rounds and security cameras for their safety. Patient instructed to come to this nurse with needs or concerns. 

## 2021-04-09 NOTE — ED Notes (Signed)
IVC/ [facility] Waitlist 

## 2021-04-09 NOTE — ED Notes (Signed)
Pt given dinner tray.

## 2021-04-09 NOTE — ED Provider Notes (Signed)
Emergency Medicine Observation Re-evaluation Note  Jackson [name] is a 35 y.o. male, seen on rounds today.  Pt initially presented to the ED for complaints of Aggressive Behavior  Currently, the patient is calm, no acute complaints.  Physical Exam  Blood pressure 116/77, pulse 80, temperature 97.7 F (36.5 C), temperature source Oral, resp. rate 16, height 6' (1.829 m), weight 113 kg, SpO2 98 %. Physical Exam General: NAD Lungs: CTAB Psych: not agitated  ED Course / MDM  EKG:    I have reviewed the labs performed to date as well as medications administered while in observation.  Recent changes in the last 24 hours include no acute events overnight.    Plan  Current plan is for inpatient psych admission. Patient is under full IVC at this time.   Sharman Cheek, MD 04/09/21 [phone]

## 2021-04-09 NOTE — Progress Notes (Signed)
Pt lying in bed with eyes open. Pt calm, cooperative. Pt states "good, I guess" when asked how he feels. Pt denies pain and SI/HI/AVH at this time. Pt states that he is "up and down" throughout the night during sleep and describes his appetite as "100". Pt denies depression and anxiety at this time. No acute distress noted.

## 2021-04-09 NOTE — ED Notes (Signed)
IVC, papers to be renewed 9.7.22

## 2021-04-09 NOTE — ED Notes (Signed)
Meal given to pt.

## 2021-04-09 NOTE — ED Notes (Signed)
Unable to obtain vitals due to patient sleeping. Will continue to monitor.   

## 2021-04-09 NOTE — Consult Note (Signed)
[facility] Face-to-Face Psychiatry Consult   Reason for Consult: Follow-up consult progress note on 35 year old man with schizoaffective disorder Referring Physician: Katrinka Blazing Patient Identification: Jackson [name] MRN:  [id number] Principal Diagnosis: Schizoaffective disorder, bipolar type (HCC) Diagnosis:  Principal Problem:   Schizoaffective disorder, bipolar type (HCC)   Total Time spent with patient: 30 minutes  Subjective:   [name] is a 35 y.o. male patient admitted with "I am doing okay".  HPI: Patient seen chart reviewed.  Patient has not had any significant behavior problems for quite a while.  Polite with staff.  Stays to himself.  In interview he is calm fairly quiet not agitated not showing the obvious signs of mania that he had previously.  Has been compliant with medication.  Denies suicidal or homicidal thought.  Past Psychiatric History: History of schizoaffective disorder and noncompliance with multiple prior hospitalizations.  History of aggression when psychotic  Risk to Self:   Risk to Others:   Prior Inpatient Therapy:   Prior Outpatient Therapy:    Past Medical History:  Past Medical History:  Diagnosis Date   Bipolar 1 disorder (HCC)    Schizoaffective disorder (HCC)    History reviewed. No pertinent surgical history. Family History: History reviewed. No pertinent family history. Family Psychiatric  History: See previous Social History:  Social History   Substance and Sexual Activity  Alcohol Use Not Currently     Social History   Substance and Sexual Activity  Drug Use Not Currently    Social History   Socioeconomic History   Marital status: Single    Spouse name: Not on file   Number of children: Not on file   Years of education: Not on file   Highest education level: Not on file  Occupational History   Not on file  Tobacco Use   Smoking status: Light Smoker    Packs/day: 0.25    Types: Cigarettes   Smokeless tobacco: Never  Vaping  Use   Vaping Use: Never used  Substance and Sexual Activity   Alcohol use: Not Currently   Drug use: Not Currently   Sexual activity: Not on file  Other Topics Concern   Not on file  Social History Narrative   Not on file   Social Determinants of Health   Financial Resource Strain: Not on file  Food Insecurity: Not on file  Transportation Needs: Not on file  Physical Activity: Not on file  Stress: Not on file  Social Connections: Not on file   Additional Social History:    Allergies:  No Known Allergies  Labs: No results found for this or any previous visit (from the past 48 hour(s)).  Current Facility-Administered Medications  Medication Dose Route Frequency Provider Last Rate Last Admin   acetaminophen (TYLENOL) tablet 650 mg  650 mg Oral Q6H PRN Ward, Kristen N, DO   650 mg at 03/17/21 0050   diphenhydrAMINE (BENADRYL) injection 50 mg  50 mg Intramuscular Q4H PRN Rome Echavarria T, MD   50 mg at 02/18/21 1453   divalproex (DEPAKOTE) DR tablet 1,000 mg  1,000 mg Oral Q12H Jovi Alvizo T, MD   1,000 mg at 04/09/21 0953   haloperidol lactate (HALDOL) injection 5 mg  5 mg Intramuscular Q4H PRN Esiah Bazinet T, MD   5 mg at 02/18/21 1453   ibuprofen (ADVIL) tablet 600 mg  600 mg Oral Q6H PRN Ward, Kristen N, DO       LORazepam (ATIVAN) injection 2 mg  2 mg Intramuscular Q4H  PRN Eon Zunker, [name], MD   2 mg at 02/18/21 1454   menthol-cetylpyridinium (CEPACOL) lozenge 3 mg  1 lozenge Oral PRN Ward, Kristen N, DO   3 mg at 03/17/21 0214   NON FORMULARY 300 mg  300 mg Intramuscular Q14 Days Kaeo Jacome T, MD       OLANZapine zydis (ZYPREXA) disintegrating tablet 15 mg  15 mg Oral BID Foye Deer, RPH   15 mg at 04/09/21 6387   traZODone (DESYREL) tablet 25 mg  25 mg Oral QHS Charm Rings, NP       Current Outpatient Medications  Medication Sig Dispense Refill   ARIPiprazole (ABILIFY) 15 MG tablet Take 1 tablet (15 mg total) by mouth daily for 8 days. 8 tablet 0    ARIPiprazole ER (ABILIFY MAINTENA) 400 MG SRER injection Inject 2 mLs (400 mg total) into the muscle every 28 (twenty-eight) days. (Patient not taking: Reported on 02/14/2021) 1 each    chlorproMAZINE (THORAZINE) 50 MG tablet Take 5 tablets (250 mg total) by mouth 3 (three) times daily. (Patient not taking: Reported on 02/14/2021) 90 tablet 0   divalproex (DEPAKOTE) 500 MG DR tablet Take 2 tablets (1,000 mg total) by mouth every 12 (twelve) hours. (Patient not taking: Reported on 02/14/2021) 120 tablet 1   OLANZapine zydis (ZYPREXA) 15 MG disintegrating tablet Take 2 tablets (30 mg total) by mouth at bedtime. (Patient not taking: Reported on 02/14/2021) 60 tablet 0    Musculoskeletal: Strength & Muscle Tone: within normal limits Gait & Station: normal Patient leans: N/A            Psychiatric Specialty Exam:  Presentation  General Appearance: Casual; Appropriate for Environment  Eye Contact:Good  Speech:Normal Rate; Clear and Coherent  Speech Volume:Normal  Handedness:Right   Mood and Affect  Mood:Euthymic  Affect:Congruent   Thought Process  Thought Processes:Goal Directed; Coherent  Descriptions of Associations:Intact  Orientation:Full (Time, Place and Person)  Thought Content:Logical  History of Schizophrenia/Schizoaffective disorder:Yes  Duration of Psychotic Symptoms:Greater than six months  Hallucinations:No data recorded Ideas of Reference:None  Suicidal Thoughts:No data recorded Homicidal Thoughts:No data recorded  Sensorium  Memory:Immediate Fair; Recent Fair; Remote Fair  Judgment:Intact  Insight:Shallow   Executive Functions  Concentration:Fair  Attention Span:Fair  Recall:Fair  Fund of Knowledge:Fair  Language:Fair   Psychomotor Activity  Psychomotor Activity: No data recorded  Assets  Assets:Communication Skills; Desire for Improvement; Financial Resources/Insurance; Social Support   Sleep  Sleep: No data  recorded  Physical Exam: Physical Exam Vitals and nursing note reviewed.  Constitutional:      Appearance: Normal appearance.  HENT:     Head: Normocephalic and atraumatic.     Mouth/Throat:     Pharynx: Oropharynx is clear.  Eyes:     Pupils: Pupils are equal, round, and reactive to light.  Cardiovascular:     Rate and Rhythm: Normal rate and regular rhythm.  Pulmonary:     Effort: Pulmonary effort is normal.     Breath sounds: Normal breath sounds.  Abdominal:     General: Abdomen is flat.     Palpations: Abdomen is soft.  Musculoskeletal:        General: Normal range of motion.  Skin:    General: Skin is warm and dry.  Neurological:     General: No focal deficit present.     Mental Status: He is alert. Mental status is at baseline.  Psychiatric:        Mood and Affect: Mood normal.  Thought Content: Thought content normal.   Review of Systems  Constitutional: Negative.   HENT: Negative.    Eyes: Negative.   Respiratory: Negative.    Cardiovascular: Negative.   Gastrointestinal: Negative.   Musculoskeletal: Negative.   Skin: Negative.   Neurological: Negative.   Psychiatric/Behavioral: Negative.    Blood pressure 109/67, pulse 75, temperature 98.1 F (36.7 C), temperature source Oral, resp. rate 18, height 6' (1.829 m), weight 113 kg, SpO2 97 %. Body mass index is 33.79 kg/m.  Treatment Plan Summary: Medication management and Plan I reviewed the possibility of getting the patient Rellprev long-acting injectable olanzapine.  His mother had made this request because he is currently on olanzapine with good response.  Conferred with pharmacy today.  Not only is this medicine not on the formulary but administering it requires certification and completion of training both by physician and by nursing staff.  Nursing staff does not currently have this training and probably would not be appropriate to assign it to them without a decision at a higher level of the  hospital.  No change to medicine for today.  We will try to follow up with family over the next couple days.  Still on the [facility] wait list still without any positive response.  Disposition: Recommend psychiatric Inpatient admission when medically cleared. Supportive therapy provided about ongoing stressors. Discussed crisis plan, support from social network, calling 911, coming to the Emergency Department, and calling Suicide Hotline.  Mordecai Rasmussen, MD 04/09/2021 5:13 PM

## 2021-04-09 NOTE — ED Notes (Signed)
Pt given snack. 

## 2021-04-10 NOTE — ED Notes (Signed)
Pt does not want to shower today. Given change of clothes, toothbrush/toothpaste, and deodorant.

## 2021-04-10 NOTE — ED Notes (Signed)
Given meal tray.

## 2021-04-10 NOTE — Progress Notes (Signed)
Pt lying in bed with eyes open; calm, cooperative. Pt states that he is "feeling alright". Pt denies pain and SI/HI/AVH at this time. Pt states that he slept "alright" and "off and on" last night. Pt describes his appetite as "good". Pt denies anxiety and depression at this time. Pt reports last BM yesterday. No acute distress noted.

## 2021-04-10 NOTE — ED Notes (Signed)
Given dinner tray

## 2021-04-10 NOTE — ED Provider Notes (Signed)
Emergency Medicine Observation Re-evaluation Note  Jackson [name] is a 35 y.o. male, seen on rounds today.  Pt initially presented to the ED for complaints of Aggressive Behavior Currently, the patient is resting, no new acute concerns.  Physical Exam  BP 136/76 (BP Location: Right Arm)   Pulse 76   Temp 97.7 F (36.5 C) (Tympanic)   Resp 18   Ht 6' (1.829 m)   Wt 113 kg   SpO2 100%   BMI 33.79 kg/m  Physical Exam General: no acute distress Lungs: equal chest rise Psych: calm   ED Course / MDM  EKG:EKG Interpretation  Date/Time:  Wednesday February 13 2021 16:33:36 EDT Ventricular Rate:  76 PR Interval:  192 QRS Duration: 74 QT Interval:  354 QTC Calculation: 398 R Axis:   48 Text Interpretation: Normal sinus rhythm Junctional ST depression, probably normal Borderline ECG ----------unconfirmed---------- Confirmed by UNCONFIRMED, DOCTOR ([id number]), editor Tamsen Snider [phone]) on 02/15/2021 12:32:46 PM  I have reviewed the labs performed to date as well as medications administered while in observation.  Recent changes in the last 24 hours include none.  Plan  Current plan is for inpatient. Jackson [name] is under involuntary commitment.      Gilles Chiquito, MD 04/10/21 [phone]

## 2021-04-10 NOTE — ED Notes (Signed)
IVC renewed / Pending [facility]

## 2021-04-10 NOTE — ED Notes (Signed)
Pt asleep at this time, unable to collect vitals. Will collect pt vitals once awake. 

## 2021-04-10 NOTE — ED Notes (Signed)
Given lunch tray.

## 2021-04-10 NOTE — ED Notes (Addendum)
Pt. Got his drinks and snacks and ice cream and sandwich tray.

## 2021-04-11 MED ORDER — ARIPIPRAZOLE ER 400 MG IM SRER
400.0000 mg | INTRAMUSCULAR | Status: DC
  Administered 2021-04-11: 400 mg via INTRAMUSCULAR
  Filled 2021-04-11: qty 2

## 2021-04-11 NOTE — ED Notes (Signed)
Unlocked bathroom door to allow patient to use restroom. Pt exited and bathroom door locked behind patient by staff.  

## 2021-04-11 NOTE — ED Notes (Signed)
[facility] on [facility] waitlist 

## 2021-04-11 NOTE — BH Assessment (Signed)
Writer contacted [facility] and spoke with ([phone]), patient remains on wait list 

## 2021-04-11 NOTE — Progress Notes (Signed)
Pt report received from Ally, RN. 

## 2021-04-11 NOTE — ED Notes (Signed)
IVC, pending [facility] waitlist 

## 2021-04-12 NOTE — ED Notes (Addendum)
Report to include Situation, Background, Assessment, and Recommendations received from Katie RN. Patient alert and oriented, warm and dry, in no acute distress. Patient denies SI, HI, AVH and pain. Patient made aware of Q15 minute rounds and security cameras for their safety. Patient instructed to come to me with needs or concerns.  

## 2021-04-12 NOTE — Progress Notes (Signed)
Pt accepted and ate 100% of Lunch tray.   

## 2021-04-12 NOTE — ED Provider Notes (Signed)
Emergency Medicine Observation Re-evaluation Note  Jackson [name] is a 35 y.o. male, seen on rounds today.  Pt initially presented to the ED for complaints of Aggressive Behavior Currently, the patient is resting comfortably.  Physical Exam  BP 104/84 (BP Location: Left Arm)   Pulse 84   Temp 99.1 F (37.3 C) (Oral)   Resp 18   Ht 6' (1.829 m)   Wt 113 kg   SpO2 96%   BMI 33.79 kg/m  Physical Exam Constitutional:      Appearance: He is not ill-appearing or toxic-appearing.  HENT:     Head: Atraumatic.  Cardiovascular:     Comments: Appears well perfused Pulmonary:     Effort: Pulmonary effort is normal.  Abdominal:     General: There is no distension.  Musculoskeletal:        General: No deformity.  Neurological:     General: No focal deficit present.     ED Course / MDM  EKG:EKG Interpretation  Date/Time:  Wednesday February 13 2021 16:33:36 EDT Ventricular Rate:  76 PR Interval:  192 QRS Duration: 74 QT Interval:  354 QTC Calculation: 398 R Axis:   48 Text Interpretation: Normal sinus rhythm Junctional ST depression, probably normal Borderline ECG ----------unconfirmed---------- Confirmed by UNCONFIRMED, DOCTOR ([id number]), editor Tamsen Snider [phone]) on 02/15/2021 12:32:46 PM  I have reviewed the labs performed to date as well as medications administered while in observation.  Recent changes in the last 24 hours include none.  Plan  Current plan is for placement. Jackson [name] is under involuntary commitment.      Delton Prairie, MD 04/12/21 [phone]

## 2021-04-12 NOTE — Progress Notes (Signed)
Unlocked bathroom door to allow patient to use restroom. Pt exited and bathroom door locked behind patient by staff.

## 2021-04-12 NOTE — ED Notes (Signed)
Hourly rounding reveals patient in room. No complaints, stable, in no acute distress. Q15 minute rounds and monitoring via Security Cameras to continue. 

## 2021-04-12 NOTE — ED Notes (Signed)
IVC pending placement 

## 2021-04-12 NOTE — Progress Notes (Signed)
Pt A/Ox3, hospital meal provided, Dinner, which was  100% consumed.  Waste discarded appropriately

## 2021-04-12 NOTE — ED Notes (Signed)
IVC/  PENDING  PLACEMENT 

## 2021-04-12 NOTE — ED Notes (Addendum)
Pt ate 100% of meal tray. Offered shower, declines. Offered new clothing and linens, declined. Encouraged patient to clean room. Brought trash to nurses station.

## 2021-04-13 NOTE — ED Notes (Signed)
Hourly rounding reveals patient in room. No complaints, stable, in no acute distress. Q15 minute rounds and monitoring via Security Cameras to continue. 

## 2021-04-13 NOTE — BH Assessment (Signed)
Writer contacted [facility] and spoke with ([phone]), patient remains on wait list 

## 2021-04-13 NOTE — ED Notes (Signed)
VS not taken, patient asleep 

## 2021-04-13 NOTE — ED Provider Notes (Signed)
Emergency Medicine Observation Re-evaluation Note  Jackson [name] is a 35 y.o. male, seen on rounds today.  Pt initially presented to the ED for complaints of Aggressive Behavior Currently, the patient is resting comfortably.  Physical Exam  BP 107/81 (BP Location: Right Arm)   Pulse 68   Temp 97.7 F (36.5 C) (Oral)   Resp 18   Ht 6' (1.829 m)   Wt 113 kg   SpO2 98%   BMI 33.79 kg/m  Physical Exam Constitutional:      Appearance: He is not ill-appearing or toxic-appearing.  HENT:     Head: Atraumatic.  Cardiovascular:     Comments: Appears well perfused Pulmonary:     Effort: Pulmonary effort is normal.  Abdominal:     General: There is no distension.  Musculoskeletal:        General: No deformity.  Neurological:     General: No focal deficit present.     ED Course / MDM  EKG:EKG Interpretation  Date/Time:  Wednesday February 13 2021 16:33:36 EDT Ventricular Rate:  76 PR Interval:  192 QRS Duration: 74 QT Interval:  354 QTC Calculation: 398 R Axis:   48 Text Interpretation: Normal sinus rhythm Junctional ST depression, probably normal Borderline ECG ----------unconfirmed---------- Confirmed by UNCONFIRMED, DOCTOR ([id number]), editor Tamsen Snider [phone]) on 02/15/2021 12:32:46 PM  I have reviewed the labs performed to date as well as medications administered while in observation.  Recent changes in the last 24 hours include none.  Plan  Current plan is for placement. Jackson [name] is under involuntary commitment.      Delton Prairie, MD 04/13/21 [phone]

## 2021-04-13 NOTE — Consult Note (Signed)
[facility] Face-to-Face Psychiatry Consult   Reason for Consult: Follow-up consult 35 year old man with schizoaffective disorder. Referring Physician: EDP Patient Identification: Jackson [name] MRN:  [id number] Principal Diagnosis: Schizoaffective disorder, bipolar type (HCC) Diagnosis:  Principal Problem:   Schizoaffective disorder, bipolar type (HCC)   Total Time spent with patient: 30 minutes  Subjective:   Jackson [name] is a 35 y.o. male patient admitted with mania and instability of his schizoaffective disorder, awaiting [facility] placement as his guardian, his mother, wants him to go there.  HPI: Follow-up evaluation on patient.  Patient seems to be at his baseline, calm and compliant with his medications, no issues on the unit.  A few days prior, he had some depression as he voiced frustration that his step-father threw some things out of his apartment that he had to vacate.  However, this has been remedied as he states today he was able to renew his apartment's lease; states feeling happy that this has been accomplished and is without any complaints. Denies side effects from his medications, and is currently watching television in his room. stays in his room. He denies any current issues with sleep initiation after having started Trazodone PRN to assist as needed.  Denies hallucinations, delusions, suicidal ideation or homicidal ideation. Unfortunately, when he is discharged he stops taking his medications - becomes unstable and violent - and returns to the hospital.  Awaiting [facility] admission.   Past Psychiatric History: Multiple hospitalizations just in the past year due to noncompliance with medicine  Risk to Self:  none Risk to Others:  none Prior Inpatient Therapy:  multiple Prior Outpatient Therapy:  [facility] team  Past Medical History:  Past Medical History:  Diagnosis Date   Bipolar 1 disorder (HCC)    Schizoaffective disorder (HCC)    History reviewed. No pertinent surgical  history. Family History: History reviewed. No pertinent family history. Family Psychiatric  History: See previous Social History:  Social History   Substance and Sexual Activity  Alcohol Use Not Currently     Social History   Substance and Sexual Activity  Drug Use Not Currently    Social History   Socioeconomic History   Marital status: Single    Spouse name: Not on file   Number of children: Not on file   Years of education: Not on file   Highest education level: Not on file  Occupational History   Not on file  Tobacco Use   Smoking status: Light Smoker    Packs/day: 0.25    Types: Cigarettes   Smokeless tobacco: Never  Vaping Use   Vaping Use: Never used  Substance and Sexual Activity   Alcohol use: Not Currently   Drug use: Not Currently   Sexual activity: Not on file  Other Topics Concern   Not on file  Social History Narrative   Not on file   Social Determinants of Health   Financial Resource Strain: Not on file  Food Insecurity: Not on file  Transportation Needs: Not on file  Physical Activity: Not on file  Stress: Not on file  Social Connections: Not on file   Additional Social History:    Allergies:  No Known Allergies  Labs: No results found for this or any previous visit (from the past 48 hour(s)).  Current Facility-Administered Medications  Medication Dose Route Frequency Provider Last Rate Last Admin   acetaminophen (TYLENOL) tablet 650 mg  650 mg Oral Q6H PRN Ward, Kristen N, DO   650 mg at 03/17/21 0050  ARIPiprazole ER (ABILIFY MAINTENA) injection 400 mg  400 mg Intramuscular Q28 days [name], [name], MD   400 mg at 04/11/21 1322   diphenhydrAMINE (BENADRYL) injection 50 mg  50 mg Intramuscular Q4H PRN [name], [name], MD   50 mg at 02/18/21 1453   divalproex (DEPAKOTE) DR tablet 1,000 mg  1,000 mg Oral Q12H [name], [name], MD   1,000 mg at 04/13/21 1015   haloperidol lactate (HALDOL) injection 5 mg  5 mg Intramuscular Q4H PRN [name],  [name], MD   5 mg at 02/18/21 1453   ibuprofen (ADVIL) tablet 600 mg  600 mg Oral Q6H PRN Ward, Kristen N, DO       LORazepam (ATIVAN) injection 2 mg  2 mg Intramuscular Q4H PRN [name], [name], MD   2 mg at 02/18/21 1454   menthol-cetylpyridinium (CEPACOL) lozenge 3 mg  1 lozenge Oral PRN Ward, Kristen N, DO   3 mg at 03/17/21 0214   OLANZapine zydis (ZYPREXA) disintegrating tablet 15 mg  15 mg Oral BID Foye Deer, RPH   15 mg at 04/13/21 1015   traZODone (DESYREL) tablet 25 mg  25 mg Oral QHS Charm Rings, NP   25 mg at 04/10/21 2149   Current Outpatient Medications  Medication Sig Dispense Refill   ARIPiprazole (ABILIFY) 15 MG tablet Take 1 tablet (15 mg total) by mouth daily for 8 days. 8 tablet 0   ARIPiprazole ER (ABILIFY MAINTENA) 400 MG SRER injection Inject 2 mLs (400 mg total) into the muscle every 28 (twenty-eight) days. (Patient not taking: Reported on 02/14/2021) 1 each    chlorproMAZINE (THORAZINE) 50 MG tablet Take 5 tablets (250 mg total) by mouth 3 (three) times daily. (Patient not taking: Reported on 02/14/2021) 90 tablet 0   divalproex (DEPAKOTE) 500 MG DR tablet Take 2 tablets (1,000 mg total) by mouth every 12 (twelve) hours. (Patient not taking: Reported on 02/14/2021) 120 tablet 1   OLANZapine zydis (ZYPREXA) 15 MG disintegrating tablet Take 2 tablets (30 mg total) by mouth at bedtime. (Patient not taking: Reported on 02/14/2021) 60 tablet 0    Musculoskeletal: Strength & Muscle Tone: within normal limits Gait & Station: normal Patient leans: N/A  Psychiatric Specialty Exam: Physical Exam Vitals and nursing note reviewed.  Constitutional:      Appearance: Normal appearance.  HENT:     Head: Normocephalic and atraumatic.     Mouth/Throat:     Pharynx: Oropharynx is clear.  Eyes:     Pupils: Pupils are equal, round, and reactive to light.  Cardiovascular:     Rate and Rhythm: Normal rate and regular rhythm.  Pulmonary:     Effort: Pulmonary effort is normal.      Breath sounds: Normal breath sounds.  Abdominal:     General: Abdomen is flat.     Palpations: Abdomen is soft.  Musculoskeletal:        General: Normal range of motion.  Skin:    General: Skin is warm and dry.  Neurological:     General: No focal deficit present.     Mental Status: He is alert. Mental status is at baseline.  Psychiatric:        Attention and Perception: Attention normal.        Mood and Affect: Mood is depressed.        Speech: Speech normal.        Behavior: Behavior is cooperative.        Thought Content:  Thought content normal.        Cognition and Memory: Cognition normal.        Judgment: Judgment normal.    Review of Systems  Constitutional: Negative.   HENT: Negative.    Eyes: Negative.   Respiratory: Negative.    Cardiovascular: Negative.   Gastrointestinal: Negative.   Musculoskeletal: Negative.   Skin: Negative.   Neurological: Negative.   Psychiatric/Behavioral:  Positive for depression. The patient has insomnia.    Blood pressure 107/81, pulse 68, temperature 97.7 F (36.5 C), temperature source Oral, resp. rate 18, height 6' (1.829 m), weight 113 kg, SpO2 98 %.Body mass index is 33.79 kg/m.  General Appearance: Casual  Eye Contact:  Good  Speech:  Normal Rate  Volume:  Normal  Mood:  Euthymic  Affect:  Congruent  Thought Process:  Coherent and Descriptions of Associations: Intact  Orientation:  Full (Time, Place, and Person)  Thought Content:  WDL and Logical  Suicidal Thoughts:  No  Homicidal Thoughts:  No  Memory:  Immediate;   Good Recent;   Good Remote;   Good  Judgement:  Fair  Insight:  Fair  Psychomotor Activity:  Normal  Concentration:  Concentration: Good and Attention Span: Good  Recall:  Good  Fund of Knowledge:  Good  Language:  Good  Akathisia:  No  Handed:  Right  AIMS (if indicated):     Assets:  Leisure Time Physical Health Resilience Social Support  ADL's:  Intact  Cognition:  WNL  Sleep:         Physical Exam: Physical Exam Vitals and nursing note reviewed.  Constitutional:      Appearance: Normal appearance.  HENT:     Head: Normocephalic and atraumatic.     Mouth/Throat:     Pharynx: Oropharynx is clear.  Eyes:     Pupils: Pupils are equal, round, and reactive to light.  Cardiovascular:     Rate and Rhythm: Normal rate and regular rhythm.  Pulmonary:     Effort: Pulmonary effort is normal.     Breath sounds: Normal breath sounds.  Abdominal:     General: Abdomen is flat.     Palpations: Abdomen is soft.  Musculoskeletal:        General: Normal range of motion.  Skin:    General: Skin is warm and dry.  Neurological:     General: No focal deficit present.     Mental Status: He is alert. Mental status is at baseline.  Psychiatric:        Attention and Perception: Attention normal.        Mood and Affect: Mood is depressed.        Speech: Speech normal.        Behavior: Behavior is cooperative.        Thought Content: Thought content normal.        Cognition and Memory: Cognition normal.        Judgment: Judgment normal.   Review of Systems  Constitutional: Negative.   HENT: Negative.    Eyes: Negative.   Respiratory: Negative.    Cardiovascular: Negative.   Gastrointestinal: Negative.   Musculoskeletal: Negative.   Skin: Negative.   Neurological: Negative.   Psychiatric/Behavioral:  Positive for depression. The patient has insomnia.   Blood pressure 107/81, pulse 68, temperature 97.7 F (36.5 C), temperature source Oral, resp. rate 18, height 6' (1.829 m), weight 113 kg, SpO2 98 %. Body mass index is 33.79 kg/m.  Treatment Plan Summary:  Schizoaffective disorder, bipolar type: -Continue Depakote 1000 mg BID -Continue Zyprexa 15 mg BID   Agitation medications: -Haldol 5 mg, Ativan 2 mg, and 50 mg of Benadryl IM every 4 hours PRN  Insomnia: -continue Trazodone 25 mg at  bedtime PRN, may repeat once in an hour if not effective  Disposition:   [facility]  waitlist  Nanine Means, NP 04/13/2021 10:55 AM

## 2021-04-14 NOTE — ED Notes (Signed)
Hourly rounding reveals patient in room. No complaints, stable, in no acute distress. Q15 minute rounds and monitoring via Security Cameras to continue. 

## 2021-04-14 NOTE — ED Provider Notes (Signed)
Emergency Medicine Observation Re-evaluation Note  Jackson [name] is a 35 y.o. male, seen on rounds today.  Pt initially presented to the ED for complaints of Aggressive Behavior Currently, the patient is calm and cooperative.  No distress.  Patient has no medical complaints..  Physical Exam  BP 110/72 (BP Location: Left Arm)   Pulse 71   Temp 97.9 F (36.6 C) (Oral)   Resp 18   Ht 6' (1.829 m)   Wt 113 kg   SpO2 98%   BMI 33.79 kg/m  Physical Exam General: Calm cooperative, lying in bed Cardiac: Regular rate and rhythm around 70 bpm Lungs: Clear lung sounds bilaterally.  Normal respiratory rate. Psych: Calm and cooperative.  ED Course / MDM   No recent lab work for review. Plan  Current plan is for inpatient treatment once an appropriate bed becomes available. Jackson [name] is under involuntary commitment.      Minna Antis, MD 04/14/21 [phone]

## 2021-04-14 NOTE — BH Assessment (Signed)
Writer contacted [facility] and spoke with ([name]), patient remains on wait list. 

## 2021-04-14 NOTE — ED Notes (Signed)
Pt given dinner tray.

## 2021-04-14 NOTE — ED Notes (Signed)
VS not taken, patient asleep 

## 2021-04-14 NOTE — BH Assessment (Signed)
Received phone call from [facility] ([phone]), seeking updates about the patient. He remains on wait list.

## 2021-04-14 NOTE — ED Notes (Signed)
Unlocked bathroom door for pt, bathroom door locked after pt exited.

## 2021-04-14 NOTE — ED Notes (Signed)
Pt given breakfast tray; declined shower for today.

## 2021-04-14 NOTE — ED Notes (Signed)
Pt given lunch tray.

## 2021-04-14 NOTE — ED Notes (Signed)
Pt given x2 graham crackers, x1 mango italian ice, and grape juice.

## 2021-04-14 NOTE — ED Notes (Addendum)
Pt given x1 vanilla ice cream, x2 graham crackers, and cranberry juice.

## 2021-04-14 NOTE — ED Notes (Signed)
Report to include Situation, Background, Assessment, and Recommendations received from Katie RN. Patient alert and oriented, warm and dry, in no acute distress. Patient denies SI, HI, AVH and pain. Patient made aware of Q15 minute rounds and security cameras for their safety. Patient instructed to come to me with needs or concerns.  

## 2021-04-15 MED ORDER — LORAZEPAM 1 MG PO TABS
1.0000 mg | ORAL_TABLET | Freq: Once | ORAL | Status: AC
  Administered 2021-04-15: 1 mg via ORAL
  Filled 2021-04-15: qty 1

## 2021-04-15 NOTE — ED Notes (Signed)
Vs not taken, patient asleep. 

## 2021-04-15 NOTE — ED Notes (Signed)
Hourly rounding reveals patient in room. No complaints, stable, in no acute distress. Q15 minute rounds and monitoring via Security Cameras to continue. 

## 2021-04-15 NOTE — ED Notes (Signed)
Breakfast tray given. °

## 2021-04-15 NOTE — ED Notes (Signed)
Pt stating he feels like hs is having a panic attack.  VS WNL.  EDP made aware.

## 2021-04-15 NOTE — ED Notes (Signed)
Pt used restroom then requested mango ice.

## 2021-04-15 NOTE — ED Notes (Signed)
Gave pt mango ice for snack time.

## 2021-04-15 NOTE — ED Notes (Signed)
Patient gave tray with spoon back to nurses station.

## 2021-04-15 NOTE — ED Notes (Signed)
Dinner tray given

## 2021-04-15 NOTE — ED Notes (Signed)
IVC  [facility]  WAITLIST 

## 2021-04-15 NOTE — ED Notes (Signed)
Lunch tray given. 

## 2021-04-15 NOTE — ED Notes (Signed)
Patient used restroom, door is locked. Patient was given socks per patient request.

## 2021-04-15 NOTE — ED Notes (Signed)
Resumed care from Nichele Slawson b rn.  Pt in bed watching tv.  Pt calm and cooperative

## 2021-04-15 NOTE — ED Provider Notes (Signed)
Emergency Medicine Observation Re-evaluation Note  Jackson [name] is a 35 y.o. male, seen on rounds today.  Pt initially presented to the ED for complaints of Aggressive Behavior  Currently, the patient is resting. No issues at this time.   Physical Exam  Blood pressure 108/70, pulse 71, temperature 98.2 F (36.8 C), temperature source Oral, resp. rate 18, height 6' (1.829 m), weight 113 kg, SpO2 97 %.  Physical Exam General: Calm cooperative, lying in bed Lungs: normal respiratory effort  Psych: Calm and cooperative.     ED Course / MDM     No new labs.  Plan   Current plan is to continue to wait for psych placement Patient is under full IVC at this time.   Concha Se, MD 04/15/21 [phone]

## 2021-04-16 NOTE — ED Notes (Signed)
Patient observed with no unusual behavior or acute distress. Patient with no verbalized needs or c/o at this time.... will continue to monitor and follow up as needed. Security staff monitoring patient on camera system.  

## 2021-04-16 NOTE — ED Notes (Signed)
Pt awake and in the bathroom

## 2021-04-16 NOTE — ED Notes (Signed)
Pt was offered shower, declined at this time.

## 2021-04-16 NOTE — ED Notes (Signed)
IVC/[facility] waitlist 

## 2021-04-16 NOTE — BH Assessment (Signed)
Writer contacted [facility] and spoke with ([phone]), patient remains on wait list. 

## 2021-04-16 NOTE — ED Provider Notes (Signed)
Emergency Medicine Observation Re-evaluation Note  Collyn Walther is a 35 y.o. male, seen on rounds today.  Pt initially presented to the ED for complaints of Aggressive Behavior Currently, the patient is resting comfortably.  Physical Exam  BP 100/63 (BP Location: Right Arm)   Pulse 81   Temp 99.4 F (37.4 C) (Oral)   Resp 18   Ht 6' (1.829 m)   Wt 113 kg   SpO2 96%   BMI 33.79 kg/m  Physical Exam General: No acute distress Cardiac: Well-perfused extremities Lungs: No respiratory distress Psych: Appropriate mood and affect  ED Course / MDM  EKG:EKG Interpretation  Date/Time:  Wednesday February 13 2021 16:33:36 EDT Ventricular Rate:  76 PR Interval:  192 QRS Duration: 74 QT Interval:  354 QTC Calculation: 398 R Axis:   48 Text Interpretation: Normal sinus rhythm Junctional ST depression, probably normal Borderline ECG ----------unconfirmed---------- Confirmed by UNCONFIRMED, DOCTOR ([id number]), editor Tamsen Snider [phone]) on 02/15/2021 12:32:46 PM  I have reviewed the labs performed to date as well as medications administered while in observation.  Recent changes in the last 24 hours include none.  Plan  Current plan is for psychiatric placement. Derreck Janssen is under involuntary commitment.      Merwyn Katos, MD 04/16/21 [phone]

## 2021-04-16 NOTE — ED Notes (Signed)
Report received from Amy RN. Patient care assumed. Patient/RN introduction complete. Will continue to monitor.  

## 2021-04-16 NOTE — ED Notes (Signed)
IVC/ Remains on [facility] Wait List/ Legal Paperwork will need RENEWAL

## 2021-04-16 NOTE — ED Notes (Addendum)
Pt up to bathroom.

## 2021-04-17 NOTE — ED Notes (Signed)
Correction--- Pt O2 stats is 98

## 2021-04-17 NOTE — ED Notes (Signed)
Hospital Dinner meal provided.  100% consumed, pt tolerated w/o complaints.  Waste discarded appropriately.

## 2021-04-17 NOTE — ED Notes (Signed)
Pt up to use bathroom. Bathroom door unlocked by Clinical research associate and toilet cleaned as well as floor before pt use. Door locked back after pt use by tech.

## 2021-04-17 NOTE — ED Notes (Signed)
IVC renewed by Dr. Toni Amend [facility] wait list

## 2021-04-17 NOTE — ED Notes (Signed)
IVC/pending [facility] wait list/IVC papers to be renewed 9/14

## 2021-04-17 NOTE — ED Notes (Signed)
Pt up to bathroom. Door unlocked by security with staff in dayroom. Door locked post use.

## 2021-04-17 NOTE — ED Notes (Signed)
Pt given dinner  

## 2021-04-17 NOTE — ED Notes (Signed)
Pt requested snack, informed that breakfast would be served soon and that it is not currently an allocated snack time.  Cont to monitor

## 2021-04-17 NOTE — ED Notes (Signed)
All trash removed from pts room as well as all linen. Pt provided with new linen.

## 2021-04-17 NOTE — ED Notes (Signed)
Unlocked bathroom door to allow patient to use restroom. Pt exited and bathroom door locked behind patient by staff.

## 2021-04-17 NOTE — BH Assessment (Signed)
Writer contacted [facility] and spoke with ([phone]), patient remains on wait list.

## 2021-04-17 NOTE — ED Notes (Signed)
Unlocked bathroom door to allow patient to use restroom. Pt exited and bathroom door locked behind patient by staff.  

## 2021-04-17 NOTE — ED Notes (Signed)
Breakfast with hydration services w/o incident.  A.M. medications given as well, pt tolerated well. Staff informed pt of new snack schedule pt verbalized understanding  

## 2021-04-17 NOTE — ED Notes (Signed)
Hospital lunch meal provided.  100% consumed, pt tolerated w/o complaints.  Waste discarded appropriately.  

## 2021-04-17 NOTE — ED Provider Notes (Signed)
Emergency Medicine Observation Re-evaluation Note  Jackson [name] is a 36 y.o. male, seen on rounds today.  Pt initially presented to the ED for complaints of Aggressive Behavior   Physical Exam  BP 129/76 (BP Location: Left Arm)   Pulse 81   Temp 98 F (36.7 C) (Oral)   Resp 17   Ht 6' (1.829 m)   Wt 113 kg   SpO2 99%   BMI 33.79 kg/m  Physical Exam General: Patient resting comfortably on the bed Lungs: Patient in no respiratory distress Psych: Patient not currently combative  ED Course / MDM  EKG:EKG Interpretation  Date/Time:  Wednesday February 13 2021 16:33:36 EDT Ventricular Rate:  76 PR Interval:  192 QRS Duration: 74 QT Interval:  354 QTC Calculation: 398 R Axis:   48 Text Interpretation: Normal sinus rhythm Junctional ST depression, probably normal Borderline ECG ----------unconfirmed---------- Confirmed by UNCONFIRMED, DOCTOR ([id number]), editor Tamsen Snider [phone]) on 02/15/2021 12:32:46 PM    Plan  Current plan is for patient awaiting psych placement Jackson [name] is under involuntary commitment.      Arnaldo Natal, MD 04/17/21 [phone]

## 2021-04-17 NOTE — ED Notes (Signed)
Pt given nighttime snack. 

## 2021-04-17 NOTE — ED Notes (Signed)
Patient provided snack at appropriate snack time.  Pt consumed 100% of snack provided, tolerated well w/o complaints   Trash disposted of appropriately by patient.  

## 2021-04-17 NOTE — ED Notes (Signed)
Pt up to use bathroom, door unlocked by Clinical research associate with Glass blower/designer in [place]. Door locked after bathroom use.

## 2021-04-17 NOTE — ED Notes (Signed)
Pt used phone  °

## 2021-04-17 NOTE — ED Notes (Signed)
Unlocked bathroom door to allow patient to shower.  Staff monitored dayroom out side of bathroom door during pt shower.  Pt was given I clean top, 1 clean bottom, with 1 pair of disposable underwear.

## 2021-04-17 NOTE — ED Notes (Signed)
Pt up and in dayroom when Clinical research associate making rounds. Writer introduced self to pt as well as informed of need to empty all trash from room. Pts trash taken and disposed of. No further request made by pt at this time.

## 2021-04-17 NOTE — ED Notes (Signed)
Pt given snack and drink for snack time.

## 2021-04-18 NOTE — ED Notes (Signed)
IVC, pending [facility] waitlist 

## 2021-04-18 NOTE — ED Notes (Signed)
Pt up to bathroom.

## 2021-04-18 NOTE — ED Notes (Signed)
Pt received snacks.  

## 2021-04-18 NOTE — ED Notes (Signed)
IVC  [facility]  WAITLIST 

## 2021-04-18 NOTE — ED Notes (Signed)
Resting in bed

## 2021-04-18 NOTE — ED Notes (Signed)
Resumed care from [facility].  Pt sleeping in bed.

## 2021-04-18 NOTE — ED Notes (Signed)

## 2021-04-18 NOTE — ED Notes (Signed)
Pt resting in bed.

## 2021-04-18 NOTE — ED Notes (Signed)
Pt eating dinner meal  ?

## 2021-04-18 NOTE — ED Provider Notes (Signed)
Emergency Medicine Observation Re-evaluation Note  Jackson [name] is a 35 y.o. male, seen on rounds today.  Pt initially presented to the ED for complaints of Aggressive Behavior Currently, the patient is calm, in NAD.  Physical Exam  BP 105/66 (BP Location: Left Arm)   Pulse 73   Temp 98 F (36.7 C) (Oral)   Resp 18   Ht 6' (1.829 m)   Wt 113 kg   SpO2 98%   BMI 33.79 kg/m    ED Course / MDM  EKG:EKG Interpretation  Date/Time:  Wednesday February 13 2021 16:33:36 EDT Ventricular Rate:  76 PR Interval:  192 QRS Duration: 74 QT Interval:  354 QTC Calculation: 398 R Axis:   48 Text Interpretation: Normal sinus rhythm Junctional ST depression, probably normal Borderline ECG ----------unconfirmed---------- Confirmed by UNCONFIRMED, DOCTOR ([id number]), editor Tamsen Snider [phone]) on 02/15/2021 12:32:46 PM  I have reviewed the labs performed to date as well as medications administered while in observation.  Recent changes in the last 24 hours include none.  Plan  Current plan is for psych disposition, [facility] pending. Jackson [name] is under involuntary commitment.      [name], MD 04/18/21 1310

## 2021-04-18 NOTE — ED Notes (Signed)
Dinner tray given

## 2021-04-18 NOTE — BH Assessment (Signed)
Writer contacted [facility] and spoke with ([phone]), patient remains on wait list.

## 2021-04-19 NOTE — ED Notes (Signed)
Hourly rounding reveals patient in room. No complaints, stable, in no acute distress. Q15 minute rounds and monitoring via Security Cameras to continue. 

## 2021-04-19 NOTE — ED Notes (Signed)
Ivc on [facility] wait list

## 2021-04-19 NOTE — ED Notes (Signed)
Report to include Situation, Background, Assessment, and Recommendations received from Dorian RN. Patient alert and oriented, warm and dry, in no acute distress. Patient denies SI, HI, AVH and pain. Patient made aware of Q15 minute rounds and security cameras for their safety. Patient instructed to come to me with needs or concerns.  

## 2021-04-19 NOTE — ED Notes (Signed)
Pt given snack. 

## 2021-04-19 NOTE — ED Notes (Signed)
Snack given to patient.  

## 2021-04-19 NOTE — ED Provider Notes (Signed)
Emergency Medicine Observation Re-evaluation Note  Jackson [name] is a 35 y.o. male, seen on rounds today.  Pt initially presented to the ED for complaints of Aggressive Behavior Currently, the patient is in his room in bed  Physical Exam  BP 119/78   Pulse 66   Temp 98.5 F (36.9 C) (Oral)   Resp 18   Ht 1.829 m (6')   Wt 113 kg   SpO2 98%   BMI 33.79 kg/m  Physical Exam General: No distress  Lungs: No increased work of breathing Psych: Calm, no aggression  ED Course / MDM  No acute events overnight  Plan  Patient remains on wait list for [facility] Chisum Kihara is under involuntary commitment.      Jene Every, MD 04/19/21 [phone]

## 2021-04-19 NOTE — BH Assessment (Signed)
Writer contacted [facility] and spoke with ([phone]), patient remains on wait list 

## 2021-04-20 NOTE — ED Notes (Signed)
Hourly rounding reveals patient in room. No complaints, stable, in no acute distress. Q15 minute rounds and monitoring via Security Cameras to continue. 

## 2021-04-20 NOTE — ED Notes (Signed)
VS not taken, patient asleep 

## 2021-04-20 NOTE — ED Notes (Signed)
Pt given cereal.  Cereal was poured in to cup, no spoon.

## 2021-04-20 NOTE — ED Notes (Signed)
Pt given lunch tray.

## 2021-04-20 NOTE — ED Notes (Signed)
Pt given dinner tray.

## 2021-04-20 NOTE — ED Notes (Signed)
IVC/pending [facility] waitlist 

## 2021-04-20 NOTE — ED Notes (Signed)
Pt took a shower. I asked pt if he would like me to change his bed sheets and sweep and mop his floor while he was in the shower. He said his sheets were just changed but that he would like his his floor done. This tech cleaned the swept and mopped the floor while pt was in the shower.

## 2021-04-20 NOTE — ED Notes (Signed)
Pt given breakfast tray

## 2021-04-20 NOTE — ED Notes (Signed)
Report to include Situation, Background, Assessment, and Recommendations received from Amy RN. Patient alert and oriented, warm and dry, in no acute distress. Patient denies SI, HI, AVH and pain. Patient made aware of Q15 minute rounds and security cameras for their safety. Patient instructed to come to me with needs or concerns.  

## 2021-04-20 NOTE — ED Notes (Addendum)
Snack and beverage given. 

## 2021-04-20 NOTE — ED Notes (Signed)
IVC/[facility] waitlist 

## 2021-04-21 NOTE — ED Notes (Signed)
Hourly rounding reveals patient in room. No complaints, stable, in no acute distress. Q15 minute rounds and monitoring via Security Cameras to continue. 

## 2021-04-21 NOTE — ED Notes (Signed)
Breakfast meal tray given 

## 2021-04-21 NOTE — ED Notes (Signed)
Pt given phone at this time. 

## 2021-04-21 NOTE — ED Notes (Signed)
Offered pt shower at this time. Pt refused. Offered to change pt's linen. Pt refused.

## 2021-04-21 NOTE — Consult Note (Signed)
[facility] Face-to-Face Psychiatry Consult   Reason for Consult: Follow-up consult 35 year old man with schizoaffective disorder. Referring Physician: EDP Patient Identification: Khale Ernsberger MRN:  [id number] Principal Diagnosis: Schizoaffective disorder, bipolar type (HCC) Diagnosis:  Principal Problem:   Schizoaffective disorder, bipolar type (HCC)   Total Time spent with patient: 30 minutes  Subjective:   Oluwadamilare Spirito is a 35 y.o. male patient admitted with mania and instability of his schizoaffective disorder, awaiting [facility] placement as his guardian, his mother, wants him to go there.  HPI: Follow-up evaluation on patient.  Patient seems to be at his baseline, calm and compliant with his medications, no issues on the unit.  He is positive and upbeat.  Reports he is able to stay in his apartment and desires to return.  He voices understanding this will be determined by the psychiatry director, Dr Toni Amend.  Denies hallucinations, delusions, suicidal ideation or homicidal ideation. Unfortunately, when he is discharged he stops taking his medications - becomes unstable and violent - and returns to the hospital.  Awaiting [facility] admission.   Past Psychiatric History: Multiple hospitalizations just in the past year due to noncompliance with medicine  Risk to Self:  none Risk to Others:  none Prior Inpatient Therapy:  multiple Prior Outpatient Therapy:  ACT team  Past Medical History:  Past Medical History:  Diagnosis Date   Bipolar 1 disorder (HCC)    Schizoaffective disorder (HCC)    History reviewed. No pertinent surgical history. Family History: History reviewed. No pertinent family history. Family Psychiatric  History: See previous Social History:  Social History   Substance and Sexual Activity  Alcohol Use Not Currently     Social History   Substance and Sexual Activity  Drug Use Not Currently    Social History   Socioeconomic History   Marital status: Single    Spouse  name: Not on file   Number of children: Not on file   Years of education: Not on file   Highest education level: Not on file  Occupational History   Not on file  Tobacco Use   Smoking status: Light Smoker    Packs/day: 0.25    Types: Cigarettes   Smokeless tobacco: Never  Vaping Use   Vaping Use: Never used  Substance and Sexual Activity   Alcohol use: Not Currently   Drug use: Not Currently   Sexual activity: Not on file  Other Topics Concern   Not on file  Social History Narrative   Not on file   Social Determinants of Health   Financial Resource Strain: Not on file  Food Insecurity: Not on file  Transportation Needs: Not on file  Physical Activity: Not on file  Stress: Not on file  Social Connections: Not on file   Additional Social History:    Allergies:  No Known Allergies  Labs: No results found for this or any previous visit (from the past 48 hour(s)).  Current Facility-Administered Medications  Medication Dose Route Frequency Provider Last Rate Last Admin   acetaminophen (TYLENOL) tablet 650 mg  650 mg Oral Q6H PRN Ward, Kristen N, DO   650 mg at 03/17/21 0050   ARIPiprazole ER (ABILIFY MAINTENA) injection 400 mg  400 mg Intramuscular Q28 days [name], [name], MD   400 mg at 04/11/21 1322   diphenhydrAMINE (BENADRYL) injection 50 mg  50 mg Intramuscular Q4H PRN [name], [name], MD   50 mg at 02/18/21 1453   divalproex (DEPAKOTE) DR tablet 1,000 mg  1,000 mg Oral  Q12H [name], [name], MD   1,000 mg at 04/21/21 0272   haloperidol lactate (HALDOL) injection 5 mg  5 mg Intramuscular Q4H PRN [name], [name], MD   5 mg at 02/18/21 1453   ibuprofen (ADVIL) tablet 600 mg  600 mg Oral Q6H PRN Ward, Kristen N, DO       LORazepam (ATIVAN) injection 2 mg  2 mg Intramuscular Q4H PRN [name], [name], MD   2 mg at 02/18/21 1454   menthol-cetylpyridinium (CEPACOL) lozenge 3 mg  1 lozenge Oral PRN Ward, Kristen N, DO   3 mg at 03/17/21 0214   OLANZapine zydis (ZYPREXA)  disintegrating tablet 15 mg  15 mg Oral BID Foye Deer, RPH   15 mg at 04/21/21 5366   traZODone (DESYREL) tablet 25 mg  25 mg Oral QHS Charm Rings, NP   25 mg at 04/18/21 2101   Current Outpatient Medications  Medication Sig Dispense Refill   ARIPiprazole (ABILIFY) 15 MG tablet Take 1 tablet (15 mg total) by mouth daily for 8 days. 8 tablet 0   ARIPiprazole ER (ABILIFY MAINTENA) 400 MG SRER injection Inject 2 mLs (400 mg total) into the muscle every 28 (twenty-eight) days. (Patient not taking: Reported on 02/14/2021) 1 each    chlorproMAZINE (THORAZINE) 50 MG tablet Take 5 tablets (250 mg total) by mouth 3 (three) times daily. (Patient not taking: Reported on 02/14/2021) 90 tablet 0   divalproex (DEPAKOTE) 500 MG DR tablet Take 2 tablets (1,000 mg total) by mouth every 12 (twelve) hours. (Patient not taking: Reported on 02/14/2021) 120 tablet 1   OLANZapine zydis (ZYPREXA) 15 MG disintegrating tablet Take 2 tablets (30 mg total) by mouth at bedtime. (Patient not taking: Reported on 02/14/2021) 60 tablet 0    Musculoskeletal: Strength & Muscle Tone: within normal limits Gait & Station: normal Patient leans: N/A  Psychiatric Specialty Exam: Physical Exam Vitals and nursing note reviewed.  Constitutional:      Appearance: Normal appearance.  HENT:     Head: Normocephalic and atraumatic.     Mouth/Throat:     Pharynx: Oropharynx is clear.  Eyes:     Pupils: Pupils are equal, round, and reactive to light.  Cardiovascular:     Rate and Rhythm: Normal rate and regular rhythm.  Pulmonary:     Effort: Pulmonary effort is normal.     Breath sounds: Normal breath sounds.  Abdominal:     General: Abdomen is flat.     Palpations: Abdomen is soft.  Musculoskeletal:        General: Normal range of motion.  Skin:    General: Skin is warm and dry.  Neurological:     General: No focal deficit present.     Mental Status: He is alert. Mental status is at baseline.  Psychiatric:         Attention and Perception: Attention normal.        Mood and Affect: Mood is depressed.        Speech: Speech normal.        Behavior: Behavior is cooperative.        Thought Content: Thought content normal.        Cognition and Memory: Cognition normal.        Judgment: Judgment normal.    Review of Systems  Constitutional: Negative.   HENT: Negative.    Eyes: Negative.   Respiratory: Negative.    Cardiovascular: Negative.   Gastrointestinal: Negative.   Musculoskeletal: Negative.  Skin: Negative.   Neurological: Negative.   Psychiatric/Behavioral:  Positive for depression. The patient has insomnia.    Blood pressure 115/74, pulse 78, temperature 98.7 F (37.1 C), temperature source Oral, resp. rate 20, height 6' (1.829 m), weight 113 kg, SpO2 96 %.Body mass index is 33.79 kg/m.  General Appearance: Casual  Eye Contact:  Good  Speech:  Normal Rate  Volume:  Normal  Mood:  Euthymic  Affect:  Congruent  Thought Process:  Coherent and Descriptions of Associations: Intact  Orientation:  Full (Time, Place, and Person)  Thought Content:  WDL and Logical  Suicidal Thoughts:  No  Homicidal Thoughts:  No  Memory:  Immediate;   Good Recent;   Good Remote;   Good  Judgement:  Fair  Insight:  Fair  Psychomotor Activity:  Normal  Concentration:  Concentration: Good and Attention Span: Good  Recall:  Good  Fund of Knowledge:  Good  Language:  Good  Akathisia:  No  Handed:  Right  AIMS (if indicated):     Assets:  Leisure Time Physical Health Resilience Social Support  ADL's:  Intact  Cognition:  WNL  Sleep:        Physical Exam: Physical Exam Vitals and nursing note reviewed.  Constitutional:      Appearance: Normal appearance.  HENT:     Head: Normocephalic and atraumatic.     Mouth/Throat:     Pharynx: Oropharynx is clear.  Eyes:     Pupils: Pupils are equal, round, and reactive to light.  Cardiovascular:     Rate and Rhythm: Normal rate and regular rhythm.   Pulmonary:     Effort: Pulmonary effort is normal.     Breath sounds: Normal breath sounds.  Abdominal:     General: Abdomen is flat.     Palpations: Abdomen is soft.  Musculoskeletal:        General: Normal range of motion.  Skin:    General: Skin is warm and dry.  Neurological:     General: No focal deficit present.     Mental Status: He is alert. Mental status is at baseline.  Psychiatric:        Attention and Perception: Attention normal.        Mood and Affect: Mood is depressed.        Speech: Speech normal.        Behavior: Behavior is cooperative.        Thought Content: Thought content normal.        Cognition and Memory: Cognition normal.        Judgment: Judgment normal.   Review of Systems  Constitutional: Negative.   HENT: Negative.    Eyes: Negative.   Respiratory: Negative.    Cardiovascular: Negative.   Gastrointestinal: Negative.   Musculoskeletal: Negative.   Skin: Negative.   Neurological: Negative.   Psychiatric/Behavioral:  Positive for depression. The patient has insomnia.   Blood pressure 115/74, pulse 78, temperature 98.7 F (37.1 C), temperature source Oral, resp. rate 20, height 6' (1.829 m), weight 113 kg, SpO2 96 %. Body mass index is 33.79 kg/m.  Treatment Plan Summary:  Schizoaffective disorder, bipolar type: -Continue Depakote 1000 mg BID -Continue Zyprexa 15 mg BID   Agitation medications: -Haldol 5 mg, Ativan 2 mg, and 50 mg of Benadryl IM every 4 hours PRN  Insomnia: -continue Trazodone 25 mg at  bedtime PRN, may repeat once in an hour if not effective  Disposition:   [facility] waitlist  Catha Nottingham  Railey Glad, NP 04/21/2021 2:20 PM

## 2021-04-21 NOTE — ED Notes (Signed)
VS not taken, patient asleep 

## 2021-04-21 NOTE — ED Notes (Signed)
Report to include Situation, Background, Assessment, and Recommendations received from Ashley RN. Patient alert and oriented, warm and dry, in no acute distress. Patient denies SI, HI, AVH and pain. Patient made aware of Q15 minute rounds and security cameras for their safety. Patient instructed to come to me with needs or concerns.  

## 2021-04-21 NOTE — ED Notes (Signed)
Snack given at this time  

## 2021-04-21 NOTE — ED Notes (Signed)
EDP Siadecki at bedside. 

## 2021-04-21 NOTE — ED Notes (Signed)
Pt received their snack 

## 2021-04-21 NOTE — ED Provider Notes (Signed)
Emergency Medicine Observation Re-evaluation Note  Jackson [name] is a 35 y.o. male, seen on rounds today.  Pt initially presented to the ED for complaints of Aggressive Behavior Currently, the patient is resting comfortably and has no acute complaints.  Physical Exam  BP 115/74 (BP Location: Right Arm)   Pulse 78   Temp 98.7 F (37.1 C) (Oral)   Resp 20   Ht 6' (1.829 m)   Wt 113 kg   SpO2 96%   BMI 33.79 kg/m  Physical Exam General: Alert, comfortable appearing. Cardiac: Good peripheral perfusion. Lungs: Normal respiratory effort. Psych: Calm and cooperative.  ED Course / MDM   I have reviewed the labs performed to date as well as medications administered while in observation.  There have been no recent changes in the last 24 hours.  Plan  Current plan is for placement.  He remains on the wait list for [facility]. Jackson [name] is under involuntary commitment.      Dionne Bucy, MD 04/21/21 [phone]

## 2021-04-21 NOTE — BH Assessment (Signed)
Writer contacted [facility] and spoke with ([phone]), patient remains on wait list.

## 2021-04-21 NOTE — ED Notes (Signed)
Dinner meal tray given.  

## 2021-04-21 NOTE — BH Assessment (Signed)
Writer contacted [facility] and spoke with ([phone]), patient remains on wait list. 

## 2021-04-21 NOTE — ED Notes (Signed)
Lunch meal tray given.  

## 2021-04-22 NOTE — ED Notes (Signed)
Pt declined shower. 

## 2021-04-22 NOTE — ED Notes (Signed)
Hourly rounding reveals pt. in room.  No complaints at this time pt. stable, calm and in no acute distress.  Will continue Q 15-minute rounding and monitor via security cameras.    

## 2021-04-22 NOTE — ED Notes (Signed)
Hourly rounding reveals patient in room. No complaints, stable, in no acute distress. Q15 minute rounds and monitoring via Security Cameras to continue. 

## 2021-04-22 NOTE — ED Notes (Signed)
IVC on [facility] list, papers expire 9/21

## 2021-04-22 NOTE — ED Notes (Signed)
IVC pending placement 

## 2021-04-22 NOTE — ED Notes (Signed)
Patient given meal tray and beverage. 

## 2021-04-22 NOTE — ED Provider Notes (Signed)
Emergency Medicine Observation Re-evaluation Note  Jackson [name] is a 35 y.o. male, seen on rounds today.   Physical Exam  BP 116/69 (BP Location: Right Arm)   Pulse 72   Temp 100 F (37.8 C) (Oral)   Resp 19   Ht 6' (1.829 m)   Wt 113 kg   SpO2 96%   BMI 33.79 kg/m  Physical Exam General: Patient resting comfortably in bed Lungs: Patient in no respiratory distress Psych: Patient is not agitated  ED Course / MDM    Plan  Current plan is for psych placement Jackson [name] is under involuntary commitment.      Arnaldo Natal, MD 04/22/21 [phone]

## 2021-04-22 NOTE — ED Notes (Signed)
Pt received snack and drink 

## 2021-04-22 NOTE — ED Notes (Signed)
VS not taken, patient asleep 

## 2021-04-22 NOTE — ED Notes (Signed)
Meal tray given to patient.

## 2021-04-22 NOTE — ED Notes (Signed)
Hourly rounding reveals pt. in room.  No complaints at this time pt. stable, calm and in no acute distress.  Will continue Q 15-minute rounding and monitor via security cameras.

## 2021-04-23 NOTE — ED Notes (Signed)
Pt given shower supplies and noted to be in shower. Mayra, NT on unit during shower.

## 2021-04-23 NOTE — ED Provider Notes (Signed)
Emergency Medicine Observation Re-evaluation Note  Jackson [name] is a 35 y.o. male, seen on rounds today.  Pt initially presented to the ED for complaints of Aggressive Behavior Currently, the patient is calm.  Physical Exam  BP 124/77 (BP Location: Right Arm)   Pulse 70   Temp 98.6 F (37 C) (Oral)   Resp 18   Ht 6' (1.829 m)   Wt 113 kg   SpO2 98%   BMI 33.79 kg/m    ED Course / MDM  EKG:EKG Interpretation  Date/Time:  Wednesday February 13 2021 16:33:36 EDT Ventricular Rate:  76 PR Interval:  192 QRS Duration: 74 QT Interval:  354 QTC Calculation: 398 R Axis:   48 Text Interpretation: Normal sinus rhythm Junctional ST depression, probably normal Borderline ECG ----------unconfirmed---------- Confirmed by UNCONFIRMED, DOCTOR ([id number]), editor Tamsen Snider [phone]) on 02/15/2021 12:32:46 PM  I have reviewed the labs performed to date as well as medications administered while in observation.  Recent changes in the last 24 hours include none.  Plan  Current plan is for psych disposition. Jackson [name] is under involuntary commitment.      Jackson Pollack, MD 04/23/21 [phone]

## 2021-04-23 NOTE — ED Notes (Signed)
IVC  [facility]  WAITLIST 

## 2021-04-23 NOTE — ED Notes (Signed)
Given water

## 2021-04-23 NOTE — ED Notes (Signed)
Report received from Jessica, RN including SBAR. Patient alert and oriented, warm and dry, in no acute distress. Patient denies SI, HI, AVH and pain. Patient made aware of Q15 minute rounds and security cameras for their safety. Patient instructed to come to this nurse with needs or concerns. 

## 2021-04-23 NOTE — ED Notes (Signed)
Pt given dinner  

## 2021-04-23 NOTE — ED Notes (Signed)
Pt given snack- graham crackers, mango ice and apple juice.

## 2021-04-23 NOTE — BH Assessment (Signed)
Writer contacted [facility] and spoke with ([phone]), patient remains on wait list 

## 2021-04-23 NOTE — ED Notes (Signed)
[facility] waitlist papers expire 9/21  1600

## 2021-04-23 NOTE — ED Notes (Signed)
Breakfast given.  

## 2021-04-23 NOTE — ED Notes (Signed)
Pt given lunch

## 2021-04-23 NOTE — ED Notes (Addendum)
Pt given snack-apple juice, graham crackers, and mango italian ice. Bathroom door unlocked for pt to use, then locked after pt exited.

## 2021-04-24 NOTE — ED Notes (Signed)
[facility], [facility] wait list papers expire today @ 1600

## 2021-04-24 NOTE — ED Notes (Signed)
Pt continues to frequent nurses station requesting discharge.  Patient redirected and educated on discharge plans.

## 2021-04-24 NOTE — ED Provider Notes (Signed)
Emergency Medicine Observation Re-evaluation Note  Jackson [name] is a 35 y.o. male, seen on rounds today.  Pt initially presented to the ED for complaints of Aggressive Behavior Currently, the patient is resting calmly.  Physical Exam  BP 112/82 (BP Location: Left Arm)   Pulse 67   Temp 97.8 F (36.6 C) (Oral)   Resp 18   Ht 6' (1.829 m)   Wt 113 kg   SpO2 97%   BMI 33.79 kg/m  Physical Exam General: no acute distress Psych: calm  ED Course / MDM  EKG:  I have reviewed the labs performed to date as well as medications administered while in observation.  Recent changes in the last 24 hours include none.  Plan  Current plan is for inpatient. Jossiah Koren is under involuntary commitment.      Gilles Chiquito, MD 04/24/21 1201

## 2021-04-24 NOTE — ED Notes (Signed)
Pt. Was provided with snacks. Sandwich tray and ice cream and crackers and apple juice.

## 2021-04-24 NOTE — ED Notes (Signed)
[facility]/Legal paper work validated/ Still on [facility] wait list

## 2021-04-24 NOTE — ED Notes (Signed)
Patient alert and oriented.  Patient given double portion breakfast tray.  Patient did comply with am scheduled medications and v/s were taken. Offered pt supplies to shower.  Patient declined shower.  Will continue to monitor pt Q 15 minutes during shift.

## 2021-04-24 NOTE — ED Notes (Signed)
Pt asleep at this time, unable to collect vitals. Will collect pt vitals once awake. 

## 2021-04-24 NOTE — ED Notes (Signed)

## 2021-04-24 NOTE — BH Assessment (Signed)
Writer contacted [facility] and spoke with ([phone]), patient remains on wait list. 

## 2021-04-25 DIAGNOSIS — F250 Schizoaffective disorder, bipolar type: Secondary | ICD-10-CM | POA: Diagnosis not present

## 2021-04-25 NOTE — ED Notes (Signed)
Report received from Amy, RN including SBAR. Patient alert and oriented, warm and dry, in no acute distress. Patient denies SI, HI, AVH and pain. Patient made aware of Q15 minute rounds and security cameras for their safety. Patient instructed to come to this nurse with needs or concerns.  

## 2021-04-25 NOTE — ED Notes (Signed)
Breakfast tray given. °

## 2021-04-25 NOTE — Consult Note (Signed)
[facility] Face-to-Face Psychiatry Consult   Reason for Consult:  Follow up consult Referring Physician:  EDP Patient Identification: Jackson [name] MRN:  [id number] Principal Diagnosis: Schizoaffective disorder, bipolar type (HCC) Diagnosis:  Principal Problem:   Schizoaffective disorder, bipolar type (HCC)   Total Time spent with patient: 30 minutes  Subjective:  "I have a condo I can go to." Abdullah Huegel is a 35 y.o. male patient admitted with mania, instability of mood. He is awaiting possible placement to higher level facility.   HPI:  Talked with patient today at his request. He was calm, pleasant, cooperative. Has been taking medications as prescribed and there have been no recent issues with behavior. He denies suicidal or homicidal ideation. Denies auditory or visual hallucinations. Patient is likely counting on discharge to home, but historically stops taking medications and decompensates to the point of requiring hospitalization. He voices that he wants to speak ot Dr. Toni Amend about returning home.   Past Psychiatric History: see previous  Risk to Self:   Risk to Others:   Prior Inpatient Therapy:   Prior Outpatient Therapy:    Past Medical History:  Past Medical History:  Diagnosis Date   Bipolar 1 disorder (HCC)    Schizoaffective disorder (HCC)    History reviewed. No pertinent surgical history. Family History: History reviewed. No pertinent family history. Family Psychiatric  History: see previos  Social History:  Social History   Substance and Sexual Activity  Alcohol Use Not Currently     Social History   Substance and Sexual Activity  Drug Use Not Currently    Social History   Socioeconomic History   Marital status: Single    Spouse name: Not on file   Number of children: Not on file   Years of education: Not on file   Highest education level: Not on file  Occupational History   Not on file  Tobacco Use   Smoking status: Light Smoker     Packs/day: 0.25    Types: Cigarettes   Smokeless tobacco: Never  Vaping Use   Vaping Use: Never used  Substance and Sexual Activity   Alcohol use: Not Currently   Drug use: Not Currently   Sexual activity: Not on file  Other Topics Concern   Not on file  Social History Narrative   Not on file   Social Determinants of Health   Financial Resource Strain: Not on file  Food Insecurity: Not on file  Transportation Needs: Not on file  Physical Activity: Not on file  Stress: Not on file  Social Connections: Not on file   Additional Social History:    Allergies:  No Known Allergies  Labs: No results found for this or any previous visit (from the past 48 hour(s)).  Current Facility-Administered Medications  Medication Dose Route Frequency Provider Last Rate Last Admin   acetaminophen (TYLENOL) tablet 650 mg  650 mg Oral Q6H PRN Ward, Kristen N, DO   650 mg at 03/17/21 0050   ARIPiprazole ER (ABILIFY MAINTENA) injection 400 mg  400 mg Intramuscular Q28 days [name], [name], MD   400 mg at 04/11/21 1322   diphenhydrAMINE (BENADRYL) injection 50 mg  50 mg Intramuscular Q4H PRN [name], [name], MD   50 mg at 02/18/21 1453   divalproex (DEPAKOTE) DR tablet 1,000 mg  1,000 mg Oral Q12H [name], [name], MD   1,000 mg at 04/25/21 0854   haloperidol lactate (HALDOL) injection 5 mg  5 mg Intramuscular Q4H PRN [name], [name],  MD   5 mg at 02/18/21 1453   ibuprofen (ADVIL) tablet 600 mg  600 mg Oral Q6H PRN Ward, Kristen N, DO       LORazepam (ATIVAN) injection 2 mg  2 mg Intramuscular Q4H PRN [name], [name], MD   2 mg at 02/18/21 1454   menthol-cetylpyridinium (CEPACOL) lozenge 3 mg  1 lozenge Oral PRN Ward, Kristen N, DO   3 mg at 03/17/21 0214   OLANZapine zydis (ZYPREXA) disintegrating tablet 15 mg  15 mg Oral BID Foye Deer, RPH   15 mg at 04/25/21 2683   traZODone (DESYREL) tablet 25 mg  25 mg Oral QHS Charm Rings, NP   25 mg at 04/23/21 2211   Current Outpatient Medications   Medication Sig Dispense Refill   ARIPiprazole (ABILIFY) 15 MG tablet Take 1 tablet (15 mg total) by mouth daily for 8 days. 8 tablet 0   ARIPiprazole ER (ABILIFY MAINTENA) 400 MG SRER injection Inject 2 mLs (400 mg total) into the muscle every 28 (twenty-eight) days. (Patient not taking: Reported on 02/14/2021) 1 each    chlorproMAZINE (THORAZINE) 50 MG tablet Take 5 tablets (250 mg total) by mouth 3 (three) times daily. (Patient not taking: Reported on 02/14/2021) 90 tablet 0   divalproex (DEPAKOTE) 500 MG DR tablet Take 2 tablets (1,000 mg total) by mouth every 12 (twelve) hours. (Patient not taking: Reported on 02/14/2021) 120 tablet 1   OLANZapine zydis (ZYPREXA) 15 MG disintegrating tablet Take 2 tablets (30 mg total) by mouth at bedtime. (Patient not taking: Reported on 02/14/2021) 60 tablet 0    Musculoskeletal: Strength & Muscle Tone: within normal limits Gait & Station: normal Patient leans: N/A            Psychiatric Specialty Exam:  Presentation  General Appearance: Casual; Appropriate for Environment  Eye Contact:Good  Speech:Normal Rate; Clear and Coherent  Speech Volume:Normal  Handedness:Right   Mood and Affect  Mood:Euthymic  Affect:Congruent   Thought Process  Thought Processes:Goal Directed; Coherent  Descriptions of Associations:Intact  Orientation:Full (Time, Place and Person)  Thought Content:Logical  History of Schizophrenia/Schizoaffective disorder:Yes  Duration of Psychotic Symptoms:Greater than six months  Hallucinations:No data recorded Ideas of Reference:None  Suicidal Thoughts:No data recorded Homicidal Thoughts:No data recorded  Sensorium  Memory:Immediate Fair; Recent Fair; Remote Fair  Judgment:Intact  Insight:Shallow   Executive Functions  Concentration:Fair  Attention Span:Fair  Recall:Fair  Fund of Knowledge:Fair  Language:Fair   Psychomotor Activity  Psychomotor Activity: No data recorded  Assets   Assets:Communication Skills; Desire for Improvement; Financial Resources/Insurance; Social Support   Sleep  Sleep: No data recorded  Physical Exam: Physical Exam ROS Blood pressure (!) 107/59, pulse 86, temperature 98.6 F (37 C), temperature source Oral, resp. rate 18, height 6' (1.829 m), weight 113 kg, SpO2 96 %. Body mass index is 33.79 kg/m.  Treatment Plan Summary: Continue current medication regimen.  Schizoaffective disorder, bipolar type: -Continue Depakote 1000 mg BID -Continue Zyprexa 15 mg BID   Agitation medications: -Haldol 5 mg, Ativan 2 mg, and 50 mg of Benadryl IM every 4 hours PRN   Insomnia: -continue Trazodone 25 mg at  bedtime PRN, may repeat once in an hour if not effective  Disposition:  [facility] Waitliist  Vanetta Mulders, NP 04/25/2021 8:12 PM

## 2021-04-25 NOTE — ED Notes (Signed)
Pt given dinner tray in room 1

## 2021-04-25 NOTE — ED Provider Notes (Signed)
Emergency Medicine Observation Re-evaluation Note  Jackson [name] is a 35 y.o. male, seen on rounds today.  Pt initially presented to the ED for complaints of Aggressive Behavior Currently, the patient is resting.  Physical Exam  BP 108/70 (BP Location: Left Arm)   Pulse 80   Temp 98.7 F (37.1 C) (Oral)   Resp 18   Ht 6' (1.829 m)   Wt 113 kg   SpO2 97%   BMI 33.79 kg/m  Physical Exam General: NAD Cardiac: well perfused Lungs: unlabored Psych: calm  ED Course / MDM  EKG:EKG Interpretation  Date/Time:  Wednesday February 13 2021 16:33:36 EDT Ventricular Rate:  76 PR Interval:  192 QRS Duration: 74 QT Interval:  354 QTC Calculation: 398 R Axis:   48 Text Interpretation: Normal sinus rhythm Junctional ST depression, probably normal Borderline ECG ----------unconfirmed---------- Confirmed by UNCONFIRMED, DOCTOR ([id number]), editor Tamsen Snider [phone]) on 02/15/2021 12:32:46 PM  I have reviewed the labs performed to date as well as medications administered while in observation.  Recent changes in the last 24 hours include .  Plan  Current plan is for psych Pauline Senske is under involuntary commitment.      Willy Eddy, MD 04/25/21 [phone]

## 2021-04-25 NOTE — Progress Notes (Signed)
CSW called [employer] and spoke with Romany who reported that the following was needed: "if no notes are received he will have to be removed from the list, we have been asking for notes for the last 2-3 days, notes from the last 72 hours are requested, physician and social worker notes are preferred over nursing notes".    CSW has faxed the last 72 hours of physicians notes to [facility].  CSW received fax confirmation that fax was successful.  CSW notes that no social work notes have been entered.   CSW spoke with [facility] who confirmed receipt of fax and it is currently being reviewed.    Penni Homans, MSW, LCSW 04/25/2021 1:38 PM

## 2021-04-25 NOTE — ED Notes (Signed)
Unable to obtain vitals due to patient sleeping. Will continue to monitor.   

## 2021-04-25 NOTE — ED Notes (Signed)
IVC/Still on [facility] Wait list ?

## 2021-04-25 NOTE — ED Notes (Signed)
Pt provided with snack and drink of choice for night snack

## 2021-04-25 NOTE — ED Notes (Signed)
Pt given lunch tray in room 1.

## 2021-04-26 NOTE — ED Notes (Signed)
Hourly rounding reveals patient in room. No complaints, stable, in no acute distress. Q15 minute rounds and monitoring via Security Cameras to continue. 

## 2021-04-26 NOTE — ED Notes (Signed)
IVC  [facility]  WAITLIST 

## 2021-04-26 NOTE — ED Provider Notes (Signed)
Emergency Medicine Observation Re-evaluation Note  Jackson [name] is a 35 y.o. male, seen on rounds today.  No acute events overnight  Physical Exam  BP 113/75 (BP Location: Right Arm)   Pulse 67   Temp 98.7 F (37.1 C) (Oral)   Resp 16   Ht 1.829 m (6')   Wt 113 kg   SpO2 96%   BMI 33.79 kg/m  Physical Exam General: No distress  Lungs: No increased work of breathing Psych: Calm and cooperative  ED Course / MDM  Pending [facility]  Plan  Patient continues to wait for [facility] Eddison Rachal is under involuntary commitment.      Jene Every, MD 04/26/21 [phone]

## 2021-04-26 NOTE — BH Assessment (Signed)
Writer contacted [facility] and spoke with ([phone]), patient remains on wait list.

## 2021-04-26 NOTE — ED Notes (Signed)
Report to include Situation, Background, Assessment, and Recommendations received from Katie RN. Patient alert and oriented, warm and dry, in no acute distress. Patient denies SI, HI, AVH and pain. Patient made aware of Q15 minute rounds and security cameras for their safety. Patient instructed to come to me with needs or concerns.  

## 2021-04-26 NOTE — ED Notes (Signed)
[facility], [facility] waitlist

## 2021-04-26 NOTE — ED Notes (Signed)
Patient provided snack at appropriate snack time.  Pt consumed 100% of snack provided, tolerated well w/o complaints   Trash disposted of appropriately by patient.  

## 2021-04-26 NOTE — ED Notes (Signed)
Snack and beverage given. 

## 2021-04-26 NOTE — ED Notes (Signed)
Unlocked bathroom door to allow patient to use restroom. Pt exited and bathroom door locked behind patient by staff.

## 2021-04-26 NOTE — ED Notes (Signed)
Encouraged patient to tidy room, provided trash can for patient to throw away any trash in patient room with staff supervision.  

## 2021-04-26 NOTE — ED Notes (Signed)
Meal tray given to patient.

## 2021-04-26 NOTE — ED Notes (Signed)
Patient provided with meal tray.

## 2021-04-26 NOTE — ED Notes (Signed)
Unlocked bathroom door to allow patient to use restroom. Pt exited and bathroom door locked behind patient by staff. Patient provided snack at appropriate snack time.  Pt consumed 100% of snack provided, tolerated well w/o complaints   Trash disposted of appropriately by patient.

## 2021-04-27 NOTE — ED Notes (Signed)
Hourly rounding reveals patient in room. No complaints, stable, in no acute distress. Q15 minute rounds and monitoring via Security Cameras to continue. 

## 2021-04-27 NOTE — ED Notes (Signed)
Breakfast tray given. No other needs found at this moment.  

## 2021-04-27 NOTE — ED Notes (Signed)
Report to include Situation, Background, Assessment, and Recommendations received from Sarah RN. Patient alert and oriented, warm and dry, in no acute distress. Patient denies SI, HI, AVH and pain. Patient made aware of Q15 minute rounds and security cameras for their safety. Patient instructed to come to me with needs or concerns.  

## 2021-04-27 NOTE — ED Notes (Signed)
Pt given snack and drink 

## 2021-04-27 NOTE — ED Notes (Signed)
Luch tray given. No other needs found at this moment.

## 2021-04-27 NOTE — ED Provider Notes (Signed)
Emergency Medicine Observation Re-evaluation Note  Alandis Sakamoto is a 35 y.o. male, seen on rounds today.  Pt initially presented to the ED for complaints of Aggressive Behavior Currently, the patient is sleeping comfortably.  Physical Exam  BP 108/71 (BP Location: Right Arm)   Pulse 65   Temp 98.2 F (36.8 C) (Oral)   Resp 18   Ht 6' (1.829 m)   Wt 113 kg   SpO2 99%   BMI 33.79 kg/m  Physical Exam General: No acute distress Lungs: resp even, unlabored Psych: calm, cooperative  ED Course / MDM  EKG:EKG Interpretation  Date/Time:  Wednesday February 13 2021 16:33:36 EDT Ventricular Rate:  76 PR Interval:  192 QRS Duration: 74 QT Interval:  354 QTC Calculation: 398 R Axis:   48 Text Interpretation: Normal sinus rhythm Junctional ST depression, probably normal Borderline ECG ----------unconfirmed---------- Confirmed by UNCONFIRMED, DOCTOR ([id number]), editor Tamsen Snider [phone]) on 02/15/2021 12:32:46 PM  I have reviewed the labs performed to date as well as medications administered while in observation.  Recent changes in the last 24 hours include none.  Plan  Current plan is for inopatient psych placement. Liliana Spurling is under involuntary commitment.      Georga Hacking, MD 04/27/21 1346

## 2021-04-27 NOTE — ED Notes (Signed)
This tech and Shanda Bumps, Charity fundraiser offered pt linen sheets change, Pt refused. Ihor Gully was taken out. Sponge bath offered. Pt refused sponge bath. No other needs found at this moment.

## 2021-04-27 NOTE — ED Notes (Signed)
Pt given meal tray and drink.

## 2021-04-27 NOTE — Consult Note (Signed)
[facility] Face-to-Face Psychiatry Consult   Reason for Consult: Follow-up consult 35 year old man with schizoaffective disorder. Referring Physician: EDP Patient Identification: Jackson [name] MRN:  [id number] Principal Diagnosis: Schizoaffective disorder, bipolar type (HCC) Diagnosis:  Principal Problem:   Schizoaffective disorder, bipolar type (HCC)   Total Time spent with patient: 30 minutes  HPI: Jackson [name] is a 35 y.o. male patient admitted with mania and instability of his schizoaffective disorder, awaiting [facility] placement as his guardian, his mother, wants him to go there. Follow up evaluation on patient. Patient seems to be at his baseline today, pleasant and cooperative. He reports "good" appetite, sleep "ok". He reports no side effects from his medications. He reports he is able to stay in his apartment and desires to return to his "condo" soon.  He denies any suicidal or homicidal ideation today. He denies any audio or visual hallucinations. He does not appear to respond to any internal or external stimuli and denies hearing voices.  Past Psychiatric History: Multiple hospitalizations just in the past year due to noncompliance with medicine  Risk to Self:  none Risk to Others:  none Prior Inpatient Therapy:  multiple Prior Outpatient Therapy:  ACT team  Past Medical History:  Past Medical History:  Diagnosis Date   Bipolar 1 disorder (HCC)    Schizoaffective disorder (HCC)    History reviewed. No pertinent surgical history. Family History: History reviewed. No pertinent family history. Family Psychiatric  History: See previous Social History:  Social History   Substance and Sexual Activity  Alcohol Use Not Currently     Social History   Substance and Sexual Activity  Drug Use Not Currently    Social History   Socioeconomic History   Marital status: Single    Spouse name: Not on file   Number of children: Not on file   Years of education: Not on file    Highest education level: Not on file  Occupational History   Not on file  Tobacco Use   Smoking status: Light Smoker    Packs/day: 0.25    Types: Cigarettes   Smokeless tobacco: Never  Vaping Use   Vaping Use: Never used  Substance and Sexual Activity   Alcohol use: Not Currently   Drug use: Not Currently   Sexual activity: Not on file  Other Topics Concern   Not on file  Social History Narrative   Not on file   Social Determinants of Health   Financial Resource Strain: Not on file  Food Insecurity: Not on file  Transportation Needs: Not on file  Physical Activity: Not on file  Stress: Not on file  Social Connections: Not on file   Additional Social History:    Allergies:  No Known Allergies  Labs: No results found for this or any previous visit (from the past 48 hour(s)).  Current Facility-Administered Medications  Medication Dose Route Frequency Provider Last Rate Last Admin   acetaminophen (TYLENOL) tablet 650 mg  650 mg Oral Q6H PRN Ward, Kristen N, DO   650 mg at 03/17/21 0050   ARIPiprazole ER (ABILIFY MAINTENA) injection 400 mg  400 mg Intramuscular Q28 days [name], [name], MD   400 mg at 04/11/21 1322   diphenhydrAMINE (BENADRYL) injection 50 mg  50 mg Intramuscular Q4H PRN [name], [name], MD   50 mg at 02/18/21 1453   divalproex (DEPAKOTE) DR tablet 1,000 mg  1,000 mg Oral Q12H [name], [name], MD   1,000 mg at 04/27/21 0915   haloperidol lactate (  HALDOL) injection 5 mg  5 mg Intramuscular Q4H PRN [name], [name], MD   5 mg at 02/18/21 1453   ibuprofen (ADVIL) tablet 600 mg  600 mg Oral Q6H PRN Ward, Kristen N, DO       LORazepam (ATIVAN) injection 2 mg  2 mg Intramuscular Q4H PRN [name], [name], MD   2 mg at 02/18/21 1454   menthol-cetylpyridinium (CEPACOL) lozenge 3 mg  1 lozenge Oral PRN Ward, Kristen N, DO   3 mg at 03/17/21 0214   OLANZapine zydis (ZYPREXA) disintegrating tablet 15 mg  15 mg Oral BID Foye Deer, RPH   15 mg at 04/27/21 0915    traZODone (DESYREL) tablet 25 mg  25 mg Oral QHS Charm Rings, NP   25 mg at 04/25/21 2124   Current Outpatient Medications  Medication Sig Dispense Refill   ARIPiprazole (ABILIFY) 15 MG tablet Take 1 tablet (15 mg total) by mouth daily for 8 days. 8 tablet 0   ARIPiprazole ER (ABILIFY MAINTENA) 400 MG SRER injection Inject 2 mLs (400 mg total) into the muscle every 28 (twenty-eight) days. (Patient not taking: Reported on 02/14/2021) 1 each    chlorproMAZINE (THORAZINE) 50 MG tablet Take 5 tablets (250 mg total) by mouth 3 (three) times daily. (Patient not taking: Reported on 02/14/2021) 90 tablet 0   divalproex (DEPAKOTE) 500 MG DR tablet Take 2 tablets (1,000 mg total) by mouth every 12 (twelve) hours. (Patient not taking: Reported on 02/14/2021) 120 tablet 1   OLANZapine zydis (ZYPREXA) 15 MG disintegrating tablet Take 2 tablets (30 mg total) by mouth at bedtime. (Patient not taking: Reported on 02/14/2021) 60 tablet 0    Musculoskeletal: Strength & Muscle Tone: within normal limits Gait & Station: normal Patient leans: N/A  Psychiatric Specialty Exam: Physical Exam Vitals and nursing note reviewed.  Constitutional:      Appearance: Normal appearance.  HENT:     Head: Normocephalic and atraumatic.     Mouth/Throat:     Pharynx: Oropharynx is clear.  Eyes:     Pupils: Pupils are equal, round, and reactive to light.  Cardiovascular:     Rate and Rhythm: Normal rate and regular rhythm.  Pulmonary:     Effort: Pulmonary effort is normal.     Breath sounds: Normal breath sounds.  Abdominal:     General: Abdomen is flat.     Palpations: Abdomen is soft.  Musculoskeletal:        General: Normal range of motion.  Skin:    General: Skin is warm and dry.  Neurological:     General: No focal deficit present.     Mental Status: He is alert. Mental status is at baseline.  Psychiatric:        Attention and Perception: Attention normal.        Mood and Affect: Mood is depressed.         Speech: Speech normal.        Behavior: Behavior is cooperative.        Thought Content: Thought content normal.        Cognition and Memory: Cognition normal.        Judgment: Judgment normal.    Review of Systems  Constitutional: Negative.   HENT: Negative.    Eyes: Negative.   Respiratory: Negative.    Cardiovascular: Negative.   Gastrointestinal: Negative.   Musculoskeletal: Negative.   Skin: Negative.   Neurological: Negative.   Psychiatric/Behavioral:  Positive for depression. The  patient has insomnia.    Blood pressure 108/71, pulse 65, temperature 98.2 F (36.8 C), temperature source Oral, resp. rate 18, height 6' (1.829 m), weight 113 kg, SpO2 99 %.Body mass index is 33.79 kg/m.  General Appearance: Casual  Eye Contact:  Good  Speech:  Normal Rate  Volume:  Normal  Mood:  Euthymic  Affect:  Congruent  Thought Process:  Coherent and Descriptions of Associations: Intact  Orientation:  Full (Time, Place, and Person)  Thought Content:  WDL and Logical  Suicidal Thoughts:  No  Homicidal Thoughts:  No  Memory:  Immediate;   Good Recent;   Good Remote;   Good  Judgement:  Fair  Insight:  Fair  Psychomotor Activity:  Normal  Concentration:  Concentration: Good and Attention Span: Good  Recall:  Good  Fund of Knowledge:  Good  Language:  Good  Akathisia:  No  Handed:  Right  AIMS (if indicated):     Assets:  Leisure Time Physical Health Resilience Social Support  ADL's:  Intact  Cognition:  WNL  Sleep:        Physical Exam: Physical Exam Vitals and nursing note reviewed.  Constitutional:      Appearance: Normal appearance.  HENT:     Head: Normocephalic and atraumatic.     Mouth/Throat:     Pharynx: Oropharynx is clear.  Eyes:     Pupils: Pupils are equal, round, and reactive to light.  Cardiovascular:     Rate and Rhythm: Normal rate and regular rhythm.  Pulmonary:     Effort: Pulmonary effort is normal.     Breath sounds: Normal breath sounds.   Abdominal:     General: Abdomen is flat.     Palpations: Abdomen is soft.  Musculoskeletal:        General: Normal range of motion.  Skin:    General: Skin is warm and dry.  Neurological:     General: No focal deficit present.     Mental Status: He is alert. Mental status is at baseline.  Psychiatric:        Attention and Perception: Attention normal.        Mood and Affect: Mood is depressed.        Speech: Speech normal.        Behavior: Behavior is cooperative.        Thought Content: Thought content normal.        Cognition and Memory: Cognition normal.        Judgment: Judgment normal.   Review of Systems  Constitutional: Negative.   HENT: Negative.    Eyes: Negative.   Respiratory: Negative.    Cardiovascular: Negative.   Gastrointestinal: Negative.   Musculoskeletal: Negative.   Skin: Negative.   Neurological: Negative.   Psychiatric/Behavioral:  Positive for depression. The patient has insomnia.   Blood pressure 108/71, pulse 65, temperature 98.2 F (36.8 C), temperature source Oral, resp. rate 18, height 6' (1.829 m), weight 113 kg, SpO2 99 %. Body mass index is 33.79 kg/m.  Treatment Plan Summary:  Schizoaffective disorder, bipolar type: -Continue Depakote 1000 mg BID -Continue Zyprexa 15 mg BID   Agitation medications: -Haldol 5 mg, Ativan 2 mg, and 50 mg of Benadryl IM every 4 hours PRN  Insomnia: -continue Trazodone 25 mg at  bedtime PRN, may repeat once in an hour if not effective  Disposition:   [facility] waitlist  Nanine Means, NP 04/27/2021 10:30 AM

## 2021-04-27 NOTE — ED Notes (Signed)
Pt given ice water.

## 2021-04-27 NOTE — ED Notes (Signed)
Vs not taken, patient asleep. 

## 2021-04-27 NOTE — ED Notes (Signed)
IVC/ [facility] Waitlist 

## 2021-04-28 NOTE — ED Notes (Signed)
Hourly rounding reveals patient in room. No complaints, stable, in no acute distress. Q15 minute rounds and monitoring via Security Cameras to continue. 

## 2021-04-28 NOTE — ED Notes (Signed)
Report to include Situation, Background, Assessment, and Recommendations received from Annie RN. Patient alert and oriented, warm and dry, in no acute distress. Patient denies SI, HI, AVH and pain. Patient made aware of Q15 minute rounds and security cameras for their safety. Patient instructed to come to me with needs or concerns. ? ?

## 2021-04-28 NOTE — ED Notes (Signed)
Snack provided for patient at this time.

## 2021-04-28 NOTE — ED Notes (Signed)
IVC pending placement 

## 2021-04-28 NOTE — ED Notes (Signed)
VS not taken, patient asleep 

## 2021-04-28 NOTE — ED Provider Notes (Signed)
Emergency Medicine Observation Re-evaluation Note  Jackson [name] is a 35 y.o. male, seen on rounds today.  Pt initially presented to the ED for complaints of Aggressive Behavior Currently, the patient is resting comfortably.  Physical Exam  BP 110/80   Pulse 60   Temp 98 F (36.7 C) (Oral)   Resp 17   Ht 6' (1.829 m)   Wt 113 kg   SpO2 99%   BMI 33.79 kg/m  Physical Exam General: No acute distress Cardiac: Well-perfused extremities Lungs: No respiratory distress Psych: Appropriate mood and affect  ED Course / MDM  EKG:EKG Interpretation  Date/Time:  Wednesday February 13 2021 16:33:36 EDT Ventricular Rate:  76 PR Interval:  192 QRS Duration: 74 QT Interval:  354 QTC Calculation: 398 R Axis:   48 Text Interpretation: Normal sinus rhythm Junctional ST depression, probably normal Borderline ECG ----------unconfirmed---------- Confirmed by UNCONFIRMED, DOCTOR ([id number]), editor Tamsen Snider [phone]) on 02/15/2021 12:32:46 PM  I have reviewed the labs performed to date as well as medications administered while in observation.  Recent changes in the last 24 hours include none.  Plan  Current plan is for placement. Jackson [name] is under involuntary commitment.      Jackson Katos, MD 04/28/21 [phone]

## 2021-04-29 DIAGNOSIS — F250 Schizoaffective disorder, bipolar type: Secondary | ICD-10-CM | POA: Diagnosis not present

## 2021-04-29 NOTE — ED Notes (Signed)
Dinner tray given

## 2021-04-29 NOTE — ED Notes (Signed)
VS not taken, patient asleep 

## 2021-04-29 NOTE — ED Notes (Signed)
Hourly rounding reveals patient in room. No complaints, stable, in no acute distress. Q15 minute rounds and monitoring via Security Cameras to continue. 

## 2021-04-29 NOTE — ED Notes (Signed)
IVC/  PENDING  PLACEMENT 

## 2021-04-29 NOTE — ED Notes (Signed)
Pt given underwear and wash cloth per pt request.

## 2021-04-29 NOTE — ED Notes (Signed)
Pt asking for clean scrubs and things to bathe with. Pt made aware that he could not shower at this time. Pt stated "I always sleep during the day." Pt made aware that he could shower during the day before everyone goes to bed.

## 2021-04-29 NOTE — ED Notes (Signed)
Lunch tray given. 

## 2021-04-29 NOTE — ED Notes (Signed)
Pt asleep, VS not obtained at this time.  

## 2021-04-29 NOTE — Consult Note (Signed)
[facility] Face-to-Face Psychiatry Consult   Reason for Consult: Follow-up for 35 year old man with schizophrenia who has been in the hospital for a couple months Referring Physician:  Issacs Patient Identification: Jackson [name] MRN:  [id number] Principal Diagnosis: Schizoaffective disorder, bipolar type (HCC) Diagnosis:  Principal Problem:   Schizoaffective disorder, bipolar type (HCC)   Total Time spent with patient: 20 minutes  Subjective:   Jackson [name] is a 35 y.o. male patient admitted with "I am good".  HPI: See multiple previous notes.  35 year old man well known to [place].  When he is manic he is capable of violence and so was excluded from admission to our unit.  We [facility] without success.  Meanwhile he has finally stabilized on medication.  Patient today has no complaints.  Not agitated not showing hyperactive behavior or intimidating behavior.  Spoke with his mother last week and confirmed that they will be comfortable taking him home later this week  Past Psychiatric History: Long history of chronic mental illness made much worse when he is off his medicine  Risk to Self:   Risk to Others:   Prior Inpatient Therapy:   Prior Outpatient Therapy:    Past Medical History:  Past Medical History:  Diagnosis Date   Bipolar 1 disorder (HCC)    Schizoaffective disorder (HCC)    History reviewed. No pertinent surgical history. Family History: History reviewed. No pertinent family history. Family Psychiatric  History: See previous Social History:  Social History   Substance and Sexual Activity  Alcohol Use Not Currently     Social History   Substance and Sexual Activity  Drug Use Not Currently    Social History   Socioeconomic History   Marital status: Single    Spouse name: Not on file   Number of children: Not on file   Years of education: Not on file   Highest education level: Not on file  Occupational History   Not on file   Tobacco Use   Smoking status: Light Smoker    Packs/day: 0.25    Types: Cigarettes   Smokeless tobacco: Never  Vaping Use   Vaping Use: Never used  Substance and Sexual Activity   Alcohol use: Not Currently   Drug use: Not Currently   Sexual activity: Not on file  Other Topics Concern   Not on file  Social History Narrative   Not on file   Social Determinants of Health   Financial Resource Strain: Not on file  Food Insecurity: Not on file  Transportation Needs: Not on file  Physical Activity: Not on file  Stress: Not on file  Social Connections: Not on file   Additional Social History:    Allergies:  No Known Allergies  Labs: No results found for this or any previous visit (from the past 48 hour(s)).  Current Facility-Administered Medications  Medication Dose Route Frequency Provider Last Rate Last Admin   acetaminophen (TYLENOL) tablet 650 mg  650 mg Oral Q6H PRN Ward, Kristen N, DO   650 mg at 03/17/21 0050   ARIPiprazole ER (ABILIFY MAINTENA) injection 400 mg  400 mg Intramuscular Q28 days Dara Camargo T, MD   400 mg at 04/11/21 1322   diphenhydrAMINE (BENADRYL) injection 50 mg  50 mg Intramuscular Q4H PRN Leia Coletti T, MD   50 mg at 02/18/21 1453   divalproex (DEPAKOTE) DR tablet 1,000 mg  1,000 mg Oral Q12H Maleya Leever, [name], MD   1,000 mg at 04/29/21  6948   haloperidol lactate (HALDOL) injection 5 mg  5 mg Intramuscular Q4H PRN Eshal Propps T, MD   5 mg at 02/18/21 1453   ibuprofen (ADVIL) tablet 600 mg  600 mg Oral Q6H PRN Ward, Kristen N, DO       LORazepam (ATIVAN) injection 2 mg  2 mg Intramuscular Q4H PRN Coryn Mosso T, MD   2 mg at 02/18/21 1454   menthol-cetylpyridinium (CEPACOL) lozenge 3 mg  1 lozenge Oral PRN Ward, Kristen N, DO   3 mg at 03/17/21 0214   OLANZapine zydis (ZYPREXA) disintegrating tablet 15 mg  15 mg Oral BID Foye Deer, RPH   15 mg at 04/29/21 0950   traZODone (DESYREL) tablet 25 mg  25 mg Oral QHS Charm Rings, NP   25 mg at  04/25/21 2124   Current Outpatient Medications  Medication Sig Dispense Refill   ARIPiprazole (ABILIFY) 15 MG tablet Take 1 tablet (15 mg total) by mouth daily for 8 days. 8 tablet 0   ARIPiprazole ER (ABILIFY MAINTENA) 400 MG SRER injection Inject 2 mLs (400 mg total) into the muscle every 28 (twenty-eight) days. (Patient not taking: Reported on 02/14/2021) 1 each    chlorproMAZINE (THORAZINE) 50 MG tablet Take 5 tablets (250 mg total) by mouth 3 (three) times daily. (Patient not taking: Reported on 02/14/2021) 90 tablet 0   divalproex (DEPAKOTE) 500 MG DR tablet Take 2 tablets (1,000 mg total) by mouth every 12 (twelve) hours. (Patient not taking: Reported on 02/14/2021) 120 tablet 1   OLANZapine zydis (ZYPREXA) 15 MG disintegrating tablet Take 2 tablets (30 mg total) by mouth at bedtime. (Patient not taking: Reported on 02/14/2021) 60 tablet 0    Musculoskeletal: Strength & Muscle Tone: Left port Gait & Station: normal Patient leans: N/A            Psychiatric Specialty Exam:  Presentation  General Appearance: Casual; Appropriate for Environment  Eye Contact:Good  Speech:Normal Rate; Clear and Coherent  Speech Volume:Normal  Handedness:Right   Mood and Affect  Mood:Euthymic  Affect:Congruent   Thought Process  Thought Processes:Goal Directed; Coherent  Descriptions of Associations:Intact  Orientation:Full (Time, Place and Person)  Thought Content:Logical  History of Schizophrenia/Schizoaffective disorder:Yes  Duration of Psychotic Symptoms:Greater than six months  Hallucinations:No data recorded Ideas of Reference:None  Suicidal Thoughts:No data recorded Homicidal Thoughts:No data recorded  Sensorium  Memory:Immediate Fair; Recent Fair; Remote Fair  Judgment:Intact  Insight:Shallow   Executive Functions  Concentration:Fair  Attention Span:Fair  Recall:Fair  Fund of Knowledge:Fair  Language:Fair   Psychomotor Activity  Psychomotor  Activity: No data recorded  Assets  Assets:Communication Skills; Desire for Improvement; Financial Resources/Insurance; Social Support   Sleep  Sleep: No data recorded  Physical Exam: Physical Exam Vitals and nursing note reviewed.  Constitutional:      Appearance: Normal appearance.  HENT:     Head: Normocephalic and atraumatic.     Mouth/Throat:     Pharynx: Oropharynx is clear.  Eyes:     Pupils: Pupils are equal, round, and reactive to light.  Cardiovascular:     Rate and Rhythm: Normal rate and regular rhythm.  Pulmonary:     Effort: Pulmonary effort is normal.     Breath sounds: Normal breath sounds.  Abdominal:     General: Abdomen is flat.     Palpations: Abdomen is soft.  Musculoskeletal:        General: Normal range of motion.  Skin:    General: Skin is warm  and dry.  Neurological:     General: No focal deficit present.     Mental Status: He is alert. Mental status is at baseline.  Psychiatric:        Mood and Affect: Mood normal.        Thought Content: Thought content normal.   Review of Systems  Constitutional: Negative.   HENT: Negative.    Eyes: Negative.   Respiratory: Negative.    Cardiovascular: Negative.   Gastrointestinal: Negative.   Musculoskeletal: Negative.   Skin: Negative.   Neurological: Negative.   Psychiatric/Behavioral: Negative.    Blood pressure 111/67, pulse 72, temperature 98.2 F (36.8 C), temperature source Oral, resp. rate 14, height 6' (1.829 m), weight 113 kg, SpO2 98 %. Body mass index is 33.79 kg/m.  Treatment Plan Summary: Medication management and Plan patient has been in contact with his parents as well and understands the plan.  No complaints.  If all goes well we will be able to get him discharged in a couple days once his mother and father both back in town.  Disposition:  s3ee note  Mordecai Rasmussen, MD 04/29/2021 4:33 PM

## 2021-04-29 NOTE — ED Notes (Signed)
Report received from Amy, RN including SBAR. Patient alert and oriented, warm and dry, in no acute distress. Patient denies SI, HI, AVH and pain. Patient made aware of Q15 minute rounds and security cameras for their safety. Patient instructed to come to this nurse with needs or concerns.  

## 2021-04-29 NOTE — ED Notes (Signed)
Pt given snack and juice.

## 2021-04-29 NOTE — ED Notes (Signed)
Pt allowed to use the phone. 

## 2021-04-29 NOTE — ED Provider Notes (Signed)
Emergency Medicine Observation Re-evaluation Note  Jackson [name] is a 35 y.o. male, seen on rounds today.  Pt initially presented to the ED for complaints of Aggressive Behavior Currently, the patient is resting comfortably in bed and has no complaints.  Physical Exam  BP 111/67 (BP Location: Right Arm)   Pulse 72   Temp 98.2 F (36.8 C) (Oral)   Resp 14   Ht 6' (1.829 m)   Wt 113 kg   SpO2 98%   BMI 33.79 kg/m  Physical Exam General: Comfortable appearing. Cardiac: Good peripheral perfusion. Lungs: Normal respiratory effort. Psych: Calm and cooperative.  ED Course / MDM   I have reviewed the labs performed to date as well as medications administered while in observation.  There have been no changes in status in the last 24 hours.  Plan  Current plan is for psychiatric placement. Jackson [name] is under involuntary commitment.      Dionne Bucy, MD 04/29/21 [phone]

## 2021-04-29 NOTE — ED Notes (Signed)
Patient resting quietly in room. No noted distress or abnormal behaviors noted. Will continue 15 minute checks and observation by security camera for safety. 

## 2021-04-29 NOTE — ED Notes (Signed)
Breakfast tray given. °

## 2021-04-30 NOTE — ED Notes (Signed)
Meal tray given 

## 2021-04-30 NOTE — ED Notes (Signed)
IVC papers to be renewed 9/28 pending placement of d/c to parents

## 2021-04-30 NOTE — ED Notes (Signed)
Pt asleep at this time, unable to collect vitals. Will collect pt vitals once awake. 

## 2021-04-30 NOTE — ED Notes (Signed)
Breakfast tray given. °

## 2021-04-30 NOTE — ED Notes (Signed)
Lunch tray given. 

## 2021-04-30 NOTE — ED Notes (Signed)
IVC/pending placement/ IVC paperwork to be renewed 9/28

## 2021-05-01 NOTE — ED Notes (Signed)
Pt given snack and soda 

## 2021-05-01 NOTE — ED Notes (Signed)
Pt given cup of ice. 

## 2021-05-01 NOTE — ED Notes (Signed)
Pt states "maybe tomorrow" when asked about showering.

## 2021-05-01 NOTE — ED Notes (Signed)
Pt given meal tray and soda

## 2021-05-01 NOTE — ED Notes (Signed)
Pt given snack and juice.

## 2021-05-01 NOTE — ED Notes (Signed)
Pt given breakfast tray with juice and cereal.

## 2021-05-01 NOTE — ED Notes (Signed)
IVC Dr. Toni Amend renewed papers 9/28  pending placement

## 2021-05-01 NOTE — ED Notes (Signed)
Pt given lunch tray and juice. 

## 2021-05-02 NOTE — ED Notes (Signed)
Unlocked bathroom door to allow patient to use restroom. Pt exited and bathroom door locked behind patient by staff.  

## 2021-05-02 NOTE — ED Notes (Addendum)
Declined shower. Encouraged patient to tidy room, provided trash can for patient to throw away any trash in patient room with staff supervision.  

## 2021-05-02 NOTE — ED Notes (Signed)
Pt using phone  

## 2021-05-02 NOTE — ED Notes (Signed)
Hospital meal provided.  100% consumed, pt tolerated w/o complaints.  Waste discarded appropriately.   

## 2021-05-02 NOTE — ED Provider Notes (Signed)
Emergency Medicine Observation Re-evaluation Note  Jackson [name] is a 35 y.o. male, seen on rounds today.  Pt initially presented to the ED for complaints of Aggressive Behavior  Currently, the patient is calm, no acute complaints.  Physical Exam  Blood pressure 116/85, pulse 72, temperature 98.5 F (36.9 C), temperature source Oral, resp. rate 18, height 6' (1.829 m), weight 113 kg, SpO2 98 %. Physical Exam General: NAD Lungs: CTAB Psych: not agitated  ED Course / MDM  EKG:    I have reviewed the labs performed to date as well as medications administered while in observation.  Recent changes in the last 24 hours include no acute events overnight.    Plan  Current plan is for psych management/dispo. Patient is under full IVC at this time.   Sharman Cheek, MD 05/02/21 1504

## 2021-05-02 NOTE — BH Assessment (Addendum)
Writer contacted [facility] and spoke with ([phone]), patient remains on wait list. 

## 2021-05-02 NOTE — ED Notes (Signed)
Patient provided snack at appropriate snack time.  Pt consumed 100% of snack provided, tolerated well w/o complaints   Trash disposted of appropriately by patient.  

## 2021-05-02 NOTE — ED Notes (Signed)
Report received from Georgie, RN including SBAR. Patient alert and oriented, warm and dry, in no acute distress. Patient denies SI, HI, AVH and pain. Patient made aware of Q15 minute rounds and security cameras for their safety. Patient instructed to come to this nurse with needs or concerns.  

## 2021-05-02 NOTE — ED Notes (Signed)
Pt. Got snacks, sandwich tray and ice cream and crackers and a drink.

## 2021-05-03 NOTE — ED Notes (Signed)
Hospital meal provided.  100% consumed, pt tolerated w/o complaints.  Waste discarded appropriately.   

## 2021-05-03 NOTE — ED Notes (Signed)
Unlocked bathroom door to allow patient to use restroom. Pt exited and bathroom door locked behind patient by staff.  

## 2021-05-03 NOTE — ED Notes (Signed)
Hospital meal provided.  100% consumed, pt tolerated w/o complaints.  Waste discarded appropriately.    Declined shower. 

## 2021-05-03 NOTE — ED Notes (Signed)
IVC pending placement 

## 2021-05-03 NOTE — ED Provider Notes (Signed)
Emergency Medicine Observation Re-evaluation Note  Jackson [name] is a 35 y.o. male, seen on rounds today.  Pt initially presented to the ED for complaints of Aggressive Behavior  Currently, the patient is calm, no acute complaints.  Reports resting somewhat well last evening.  He has no specific complaint.  Physical Exam   Vitals:   05/02/21 1620 05/02/21 1947  BP: 128/84 (!) 99/59  Pulse: 77 80  Resp: 16 20  Temp: 98.5 F (36.9 C) 98.4 F (36.9 C)  SpO2: 98% 96%   Physical Exam General: NAD Lungs: CTAB Psych: not agitated  ED Course / MDM  EKG:    I have reviewed the labs performed to date as well as medications administered while in observation.  Recent changes in the last 24 hours include no acute events overnight.    Plan  Current plan is for psych management/dispo. Patient is under full IVC at this time.     Sharyn Creamer, MD 05/03/21 [phone]

## 2021-05-03 NOTE — ED Notes (Signed)
[facility] on [facility] waitlist 

## 2021-05-03 NOTE — ED Notes (Signed)
Patient provided snack at appropriate snack time.  Pt consumed 100% of snack provided, tolerated well w/o complaints   Trash disposted of appropriately by patient.  

## 2021-05-03 NOTE — ED Notes (Signed)
Pt asleep at this time, unable to collect vitals. Will collect pt vitals once awake. 

## 2021-05-03 NOTE — ED Notes (Addendum)
Pt states he now wants a shower. Unlocked bathroom door to allow patient to shower.  Staff continuously monitored dayroom during pt shower.  Pt was given hygiene items as well as:  I clean top, 1 clean bottom, with 1 pair of disposable underwear.  Pt changed out into clean clothing.  Staff disposed of all shower supplies. Shower room cleaned and secured for next use.

## 2021-05-03 NOTE — BH Assessment (Signed)
Writer contacted [facility] and spoke with ([phone]), patient remains on wait list.

## 2021-05-03 NOTE — ED Notes (Signed)
Snack tray provided, pt up to restroom.

## 2021-05-04 NOTE — ED Notes (Signed)
Snack given at this time  

## 2021-05-04 NOTE — ED Notes (Signed)
Extra sandwich sent up by dietary and given to patient.
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
## 2021-05-04 NOTE — ED Notes (Signed)
Meal tray given 

## 2021-05-04 NOTE — ED Notes (Signed)
RN called dietary asking for pt's double portions.

## 2021-05-04 NOTE — ED Notes (Signed)
Pt given lunch tray.

## 2021-05-04 NOTE — ED Notes (Signed)
Report to amy, rn

## 2021-05-04 NOTE — ED Notes (Signed)
Pt given breakfast tray

## 2021-05-05 DIAGNOSIS — F250 Schizoaffective disorder, bipolar type: Secondary | ICD-10-CM | POA: Diagnosis not present

## 2021-05-05 NOTE — ED Notes (Signed)
Pt up to bathroom.

## 2021-05-05 NOTE — ED Notes (Signed)
Pt. Has been given breakfast tray, with juice

## 2021-05-05 NOTE — ED Provider Notes (Signed)
Emergency Medicine Observation Re-evaluation Note  Jackson [name] is a 35 y.o. male, seen on rounds today.  Pt initially presented to the ED for complaints of Aggressive Behavior Currently, the patient is calm.  Physical Exam  BP 119/90   Pulse 69   Temp 98.9 F (37.2 C) (Oral)   Resp 18   Ht 6' (1.829 m)   Wt 113 kg   SpO2 98%   BMI 33.79 kg/m  Physical Exam General:no acute distress Psych: calm  ED Course / MDM  EKG:  I have reviewed the labs performed to date as well as medications administered while in observation.  Recent changes in the last 24 hours include none.  Plan  Current plan is for inpatient. Calen Cypress is under involuntary commitment.      Gilles Chiquito, MD 05/05/21 [phone]

## 2021-05-05 NOTE — ED Notes (Signed)
Pt. Changed mind and decided that will take shower, supplies given and currently showering.
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
## 2021-05-05 NOTE — ED Notes (Signed)
Trash collected from pt's room.  °

## 2021-05-05 NOTE — ED Notes (Signed)
IVC/pending [facility] placement

## 2021-05-05 NOTE — ED Notes (Signed)
Pt. Requested that no shower was needed.

## 2021-05-05 NOTE — ED Notes (Signed)
Pt given dinner tray.

## 2021-05-06 NOTE — ED Notes (Signed)
Unlocked bathroom door to allow patient to use restroom. Pt exited and bathroom door locked behind patient by staff.  

## 2021-05-06 NOTE — ED Notes (Signed)
Pt given snack. 

## 2021-05-06 NOTE — ED Notes (Signed)
Pt given dinner tray.

## 2021-05-06 NOTE — ED Notes (Signed)
Hospital meal provided.  100% consumed, pt tolerated w/o complaints.  Waste discarded appropriately.   

## 2021-05-06 NOTE — ED Provider Notes (Signed)
Emergency Medicine Observation Re-evaluation Note  Jackson [name] is a 35 y.o. male, seen on rounds today.    Physical Exam  BP (!) 94/47   Pulse 69   Temp 98.4 F (36.9 C) (Oral)   Resp 16   Ht 6' (1.829 m)   Wt 113 kg   SpO2 97%   BMI 33.79 kg/m  Physical Exam General: Patient resting comfortably in bed Lungs: Patient in no respiratory distress Psych: Patient not combative currently  ED Course / MDM  EKG:EKG Interpretation  Date/Time:  Wednesday February 13 2021 16:33:36 EDT Ventricular Rate:  76 PR Interval:  192 QRS Duration: 74 QT Interval:  354 QTC Calculation: 398 R Axis:   48 Text Interpretation: Normal sinus rhythm Junctional ST depression, probably normal Borderline ECG ----------unconfirmed---------- Confirmed by UNCONFIRMED, DOCTOR ([id number]), editor Tamsen Snider [id number]) on 02/15/2021 12:32:46 PM   Plan  Current plan is for psychiatric placement Jackson [name] is under involuntary commitment.      Arnaldo Natal, MD 05/06/21 [phone]

## 2021-05-06 NOTE — ED Notes (Signed)
Snack given to pt.

## 2021-05-06 NOTE — ED Notes (Signed)
Report to [facility], rn.  

## 2021-05-06 NOTE — ED Notes (Signed)
Patient provided snack at appropriate snack time.  Pt consumed 100% of snack provided, tolerated well w/o complaints   Trash disposted of appropriately by patient.  

## 2021-05-06 NOTE — ED Notes (Signed)
[facility]C pending [facility] list papers to be renewed 10/5

## 2021-05-06 NOTE — ED Notes (Signed)
Unlocked bathroom door to allow patient to use restroom. Pt exited and bathroom door locked behind patient by staff. Pt then given phone to make phone call

## 2021-05-06 NOTE — ED Notes (Signed)
Pt given snack & apple juice.

## 2021-05-07 DIAGNOSIS — F250 Schizoaffective disorder, bipolar type: Secondary | ICD-10-CM | POA: Diagnosis not present

## 2021-05-07 MED ORDER — ARIPIPRAZOLE ER 400 MG IM SRER
400.0000 mg | INTRAMUSCULAR | Status: DC
  Administered 2021-05-07: 400 mg via INTRAMUSCULAR
  Filled 2021-05-07: qty 2

## 2021-05-07 MED ORDER — TRAZODONE HCL 50 MG PO TABS: 25.0000 mg | ORAL_TABLET | Freq: Every day | ORAL | 2 refills | Status: DC

## 2021-05-07 MED ORDER — DIVALPROEX SODIUM 500 MG PO DR TAB: 1000.0000 mg | DELAYED_RELEASE_TABLET | Freq: Two times a day (BID) | ORAL | 2 refills | Status: DC

## 2021-05-07 MED ORDER — OLANZAPINE 15 MG PO TBDP: 15.0000 mg | ORAL_TABLET | Freq: Two times a day (BID) | ORAL | 2 refills | Status: DC

## 2021-05-07 MED ORDER — ARIPIPRAZOLE ER 400 MG IM SRER: 400.0000 mg | INTRAMUSCULAR | 2 refills | Status: DC

## 2021-05-07 NOTE — ED Notes (Signed)
Hospital meal provided.  100% consumed, pt tolerated w/o complaints.  Waste discarded appropriately.   

## 2021-05-07 NOTE — ED Notes (Signed)
Unlocked bathroom door to allow patient to use restroom. Pt exited and bathroom door locked behind patient by staff.  

## 2021-05-07 NOTE — ED Notes (Signed)
IVC pending placement 

## 2021-05-07 NOTE — ED Notes (Signed)
Mother and legal guardian, Crystal contacted and updated about discharge. States that stepfather, is en route to pick up patient for discharge. Crystal requested that RN contact pt ACT team at [phone] and inform of discharge. Contacted ACT team to notify and they state that they will call Crystal, legal guardian now to gather more information. Requested faxed copy of discharge paperwork-- to [phone] to [employer]

## 2021-05-07 NOTE — ED Notes (Signed)
Patient provided snack at appropriate snack time.  Pt consumed 100% of snack provided, tolerated well w/o complaints   Trash disposted of appropriately by patient.  
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
## 2021-05-07 NOTE — ED Notes (Signed)
IVC PAPERS  RESCINDED PER  DR  [name] MD  INFORMED  KATIE  RN 

## 2021-05-07 NOTE — ED Provider Notes (Signed)
Emergency Medicine Observation Re-evaluation Note  Jackson [name] is a 35 y.o. male, seen on rounds today.  Pt initially presented to the ED for complaints of Aggressive Behavior Currently, the patient is standing in common area, denies complaints.  Physical Exam  BP 130/85 (BP Location: Right Arm)   Pulse 60   Temp 98 F (36.7 C) (Oral)   Resp 16   Ht 6' (1.829 m)   Wt 113 kg   SpO2 98%   BMI 33.79 kg/m  Physical Exam Constitutional: Resting comfortably. Eyes: Conjunctivae are normal. Head: Atraumatic. Nose: No congestion/rhinnorhea. Mouth/Throat: Mucous membranes are moist. Neck: Normal ROM Cardiovascular: No cyanosis noted. Respiratory: Normal respiratory effort. Gastrointestinal: Non-distended. Genitourinary: deferred Musculoskeletal: No lower extremity tenderness nor edema. Neurologic:  Normal speech and language. No gross focal neurologic deficits are appreciated. Skin:  Skin is warm, dry and intact. No rash noted.   ED Course / MDM  EKG:EKG Interpretation  Date/Time:  Wednesday February 13 2021 16:33:36 EDT Ventricular Rate:  76 PR Interval:  192 QRS Duration: 74 QT Interval:  354 QTC Calculation: 398 R Axis:   48 Text Interpretation: Normal sinus rhythm Junctional ST depression, probably normal Borderline ECG ----------unconfirmed---------- Confirmed by UNCONFIRMED, DOCTOR ([id number]), editor Tamsen Snider [phone]) on 02/15/2021 12:32:46 PM  I have reviewed the labs performed to date as well as medications administered while in observation.  Recent changes in the last 24 hours include patient deemed appropriate for discharge home per psychiatry.  Plan  Current plan is for discharge home to care of stepfather.  Jackson [name] is not under involuntary commitment.     Chesley Noon, MD 05/07/21 1126

## 2021-05-07 NOTE — ED Notes (Addendum)
Discharged to step-father,  Lam. Pt left with all of belongings and in blue scrubs. Verified correct patient and correct discharge papers given. Pt alert and oriented X 4, stable for discharge. RR even and unlabored, color WNL. Discussed discharge instructions and follow-up as directed. Discharge medications discussed, when prescribed. Pt had opportunity to ask questions, and RN available to provide patient and/or family education.
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
## 2021-12-24 ENCOUNTER — Encounter: Payer: Self-pay | Admitting: Emergency Medicine

## 2021-12-24 ENCOUNTER — Other Ambulatory Visit: Payer: Self-pay

## 2021-12-24 ENCOUNTER — Emergency Department
Admission: EM | Admit: 2021-12-24 | Discharge: 2022-04-17 | Disposition: A | Discharge status: Home / Self Care | Payer: No Typology Code available for payment source | Attending: Emergency Medicine | Admitting: Emergency Medicine

## 2021-12-24 DIAGNOSIS — Z046 Encounter for general psychiatric examination, requested by authority: Secondary | ICD-10-CM | POA: Insufficient documentation

## 2021-12-24 DIAGNOSIS — Z20822 Contact with and (suspected) exposure to covid-19: Secondary | ICD-10-CM | POA: Insufficient documentation

## 2021-12-24 DIAGNOSIS — F29 Unspecified psychosis not due to a substance or known physiological condition: Secondary | ICD-10-CM | POA: Diagnosis not present

## 2021-12-24 DIAGNOSIS — R451 Restlessness and agitation: Secondary | ICD-10-CM | POA: Insufficient documentation

## 2021-12-24 DIAGNOSIS — F250 Schizoaffective disorder, bipolar type: Secondary | ICD-10-CM | POA: Diagnosis present

## 2021-12-24 DIAGNOSIS — F23 Brief psychotic disorder: Secondary | ICD-10-CM

## 2021-12-24 MED ORDER — OLANZAPINE 5 MG PO TBDP
15.0000 mg | ORAL_TABLET | Freq: Two times a day (BID) | ORAL | Status: DC
  Administered 2021-12-25 – 2022-04-17 (×225): 15 mg via ORAL
  Filled 2021-12-24: qty 1.5
  Filled 2021-12-24: qty 3
  Filled 2021-12-24: qty 1.5
  Filled 2021-12-24 (×2): qty 3
  Filled 2021-12-24: qty 1.5
  Filled 2021-12-24 (×12): qty 3
  Filled 2021-12-24: qty 1.5
  Filled 2021-12-24 (×7): qty 3
  Filled 2021-12-24: qty 1.5
  Filled 2021-12-24 (×2): qty 3
  Filled 2021-12-24: qty 1.5
  Filled 2021-12-24 (×8): qty 3
  Filled 2021-12-24: qty 1.5
  Filled 2021-12-24: qty 3
  Filled 2021-12-24: qty 1.5
  Filled 2021-12-24 (×2): qty 3
  Filled 2021-12-24: qty 1.5
  Filled 2021-12-24: qty 3
  Filled 2021-12-24 (×2): qty 1.5
  Filled 2021-12-24 (×4): qty 3
  Filled 2021-12-24: qty 1.5
  Filled 2021-12-24: qty 3
  Filled 2021-12-24: qty 1.5
  Filled 2021-12-24 (×2): qty 3
  Filled 2021-12-24: qty 1.5
  Filled 2021-12-24 (×2): qty 3
  Filled 2021-12-24 (×3): qty 1.5
  Filled 2021-12-24 (×16): qty 3
  Filled 2021-12-24: qty 1.5
  Filled 2021-12-24 (×6): qty 3
  Filled 2021-12-24 (×2): qty 1.5
  Filled 2021-12-24 (×6): qty 3
  Filled 2021-12-24: qty 1.5
  Filled 2021-12-24 (×4): qty 3
  Filled 2021-12-24: qty 1.5
  Filled 2021-12-24 (×2): qty 3
  Filled 2021-12-24 (×2): qty 1.5
  Filled 2021-12-24 (×3): qty 3
  Filled 2021-12-24: qty 1.5
  Filled 2021-12-24: qty 3
  Filled 2021-12-24 (×2): qty 1.5
  Filled 2021-12-24 (×15): qty 3
  Filled 2021-12-24: qty 1.5
  Filled 2021-12-24 (×16): qty 3
  Filled 2021-12-24: qty 1.5
  Filled 2021-12-24: qty 3
  Filled 2021-12-24: qty 1.5
  Filled 2021-12-24 (×3): qty 3
  Filled 2021-12-24: qty 1.5
  Filled 2021-12-24 (×3): qty 3
  Filled 2021-12-24: qty 1.5
  Filled 2021-12-24 (×3): qty 3
  Filled 2021-12-24: qty 1.5
  Filled 2021-12-24 (×3): qty 3
  Filled 2021-12-24: qty 1.5
  Filled 2021-12-24 (×6): qty 3
  Filled 2021-12-24 (×2): qty 1.5
  Filled 2021-12-24 (×2): qty 3
  Filled 2021-12-24: qty 1.5
  Filled 2021-12-24 (×8): qty 3
  Filled 2021-12-24: qty 1.5
  Filled 2021-12-24 (×2): qty 3
  Filled 2021-12-24: qty 1.5
  Filled 2021-12-24 (×3): qty 3
  Filled 2021-12-24: qty 1.5
  Filled 2021-12-24 (×2): qty 3
  Filled 2021-12-24: qty 1.5
  Filled 2021-12-24 (×3): qty 3
  Filled 2021-12-24: qty 1.5
  Filled 2021-12-24 (×7): qty 3
  Filled 2021-12-24: qty 1.5
  Filled 2021-12-24 (×3): qty 3
  Filled 2021-12-24: qty 1.5
  Filled 2021-12-24 (×7): qty 3
  Filled 2021-12-24: qty 1.5
  Filled 2021-12-24 (×25): qty 3
  Filled 2021-12-24: qty 1.5
  Filled 2021-12-24 (×5): qty 3
  Filled 2021-12-24: qty 1.5
  Filled 2021-12-24: qty 3
  Filled 2021-12-24: qty 1.5
  Filled 2021-12-24: qty 3
  Filled 2021-12-24: qty 1.5
  Filled 2021-12-24 (×2): qty 3
  Filled 2021-12-24: qty 1.5
  Filled 2021-12-24: qty 3
  Filled 2021-12-24 (×2): qty 1.5
  Filled 2021-12-24 (×6): qty 3
  Filled 2021-12-24: qty 1.5
  Filled 2021-12-24 (×6): qty 3
  Filled 2021-12-24: qty 1.5
  Filled 2021-12-24 (×5): qty 3
  Filled 2021-12-24: qty 1.5
  Filled 2021-12-24 (×3): qty 3
  Filled 2021-12-24 (×2): qty 1.5
  Filled 2021-12-24 (×3): qty 3
  Filled 2021-12-24: qty 1.5
  Filled 2021-12-24 (×4): qty 3
  Filled 2021-12-24: qty 1.5
  Filled 2021-12-24 (×11): qty 3

## 2021-12-24 MED ORDER — MIDAZOLAM HCL 2 MG/2ML IJ SOLN
INTRAMUSCULAR | Status: AC
Start: 2021-12-24 — End: 2021-12-24
  Administered 2021-12-24: 2 mg
  Filled 2021-12-24: qty 2

## 2021-12-24 MED ORDER — HALOPERIDOL LACTATE 5 MG/ML IJ SOLN
5.0000 mg | Freq: Once | INTRAMUSCULAR | Status: AC
  Administered 2021-12-24: 5 mg via INTRAMUSCULAR
  Filled 2021-12-24: qty 1

## 2021-12-24 MED ORDER — TRAZODONE HCL 50 MG PO TABS
25.0000 mg | ORAL_TABLET | Freq: Every day | ORAL | Status: DC
  Administered 2021-12-25 – 2022-04-16 (×111): 25 mg via ORAL
  Filled 2021-12-24 (×6): qty 0.5
  Filled 2021-12-24: qty 1
  Filled 2021-12-24 (×11): qty 0.5
  Filled 2021-12-24: qty 1
  Filled 2021-12-24 (×13): qty 0.5
  Filled 2021-12-24 (×3): qty 1
  Filled 2021-12-24 (×17): qty 0.5
  Filled 2021-12-24: qty 1
  Filled 2021-12-24 (×11): qty 0.5
  Filled 2021-12-24: qty 1
  Filled 2021-12-24 (×3): qty 0.5
  Filled 2021-12-24: qty 1
  Filled 2021-12-24: qty 0.5
  Filled 2021-12-24: qty 1
  Filled 2021-12-24 (×12): qty 0.5
  Filled 2021-12-24: qty 1
  Filled 2021-12-24 (×7): qty 0.5
  Filled 2021-12-24: qty 1
  Filled 2021-12-24 (×6): qty 0.5
  Filled 2021-12-24: qty 1
  Filled 2021-12-24 (×9): qty 0.5
  Filled 2021-12-24: qty 1
  Filled 2021-12-24 (×11): qty 0.5
  Filled 2021-12-24: qty 1
  Filled 2021-12-24 (×3): qty 0.5

## 2021-12-24 MED ORDER — HALOPERIDOL LACTATE 5 MG/ML IJ SOLN
INTRAMUSCULAR | Status: AC
  Administered 2021-12-24: 5 mg
  Filled 2021-12-24: qty 1

## 2021-12-24 MED ORDER — ARIPIPRAZOLE ER 400 MG IM SRER: 400.0000 mg | INTRAMUSCULAR | Status: DC

## 2021-12-24 MED ORDER — DIVALPROEX SODIUM 500 MG PO DR TAB
1000.0000 mg | DELAYED_RELEASE_TABLET | Freq: Two times a day (BID) | ORAL | Status: DC
  Administered 2021-12-25 – 2022-04-17 (×227): 1000 mg via ORAL
  Filled 2021-12-24 (×227): qty 2

## 2021-12-24 MED ORDER — MIDAZOLAM HCL 2 MG/2ML IJ SOLN
2.0000 mg | Freq: Once | INTRAMUSCULAR | Status: AC
  Administered 2021-12-24: 2 mg via INTRAMUSCULAR
  Filled 2021-12-24: qty 2

## 2021-12-24 NOTE — BH Assessment (Signed)
This Clinical research associate attempted to assess pt, along side psych NP. Pt was sleeping and unable to participate in assessment. Psych team to follow up when pt arises.

## 2021-12-24 NOTE — ED Notes (Signed)
Pt laying on floor in front of door.  Pt appears to be asleep.

## 2021-12-24 NOTE — ED Notes (Signed)
Report received from Amy, RN. Patient currently sleeping, respirations regular and unlabored. Q15 minute rounds and observation by Rover and Officer to continue. Will assess patient once awake. 

## 2021-12-24 NOTE — ED Notes (Signed)
Unable to collect labs due to patient agitation.   EDP made aware.

## 2021-12-24 NOTE — ED Notes (Signed)
Unable to obtain temperature from pt at this time. Will attempt again later.
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
## 2021-12-24 NOTE — ED Notes (Signed)
IVC from [facility], pend psych eval and placement

## 2021-12-24 NOTE — ED Notes (Signed)
Pt became agitated when dressing out into scrubs.  Pt jumped out of bed with threatening posture and made threats to "walk out this bitch."    EDP made aware. Additional IM mediations administered as ordered.

## 2021-12-24 NOTE — ED Notes (Signed)
Pt not offered a snack d/t pt sleeping at this time.

## 2021-12-24 NOTE — ED Notes (Signed)
Pt is asleep, pt required multiple IM injections due to behavior. Will allow pt to sleep and attempt to obtain vitals and blood work once awake.

## 2021-12-24 NOTE — ED Notes (Addendum)
Pt given IM medications while in handcuffs.  Nursing staff, security and [employer] officers present.
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
## 2021-12-25 DIAGNOSIS — F250 Schizoaffective disorder, bipolar type: Secondary | ICD-10-CM | POA: Diagnosis not present

## 2021-12-25 LAB — RESP PANEL BY RT-PCR (FLU A&B, COVID) ARPGX2
Influenza A by PCR: NEGATIVE
Influenza B by PCR: NEGATIVE
SARS Coronavirus 2 by RT PCR: NEGATIVE

## 2021-12-25 MED ORDER — BENZTROPINE MESYLATE 1 MG PO TABS
1.0000 mg | ORAL_TABLET | Freq: Two times a day (BID) | ORAL | Status: DC
  Administered 2021-12-25 – 2022-04-17 (×227): 1 mg via ORAL
  Filled 2021-12-25 (×227): qty 1

## 2021-12-25 MED ORDER — ARIPIPRAZOLE ER 400 MG IM SRER
400.0000 mg | INTRAMUSCULAR | Status: DC
  Administered 2021-12-25 – 2022-04-16 (×5): 400 mg via INTRAMUSCULAR
  Filled 2021-12-25 (×6): qty 2

## 2021-12-25 MED ORDER — ARIPIPRAZOLE 5 MG PO TABS
5.0000 mg | ORAL_TABLET | Freq: Every day | ORAL | Status: DC
  Administered 2021-12-25 – 2022-02-18 (×56): 5 mg via ORAL
  Filled 2021-12-25 (×58): qty 1

## 2021-12-25 NOTE — ED Notes (Signed)
IVC/Psych Consult pending °

## 2021-12-25 NOTE — ED Notes (Signed)
Pt requested shower; provided clean hospital clothing and linens.  Shower setup provided with soap, shampoo, toothbrush/toothpaste, and deoderant.  Pt able to preform own ADL's with no assistance.  Cont to monitor as ordered ° °

## 2021-12-25 NOTE — ED Notes (Signed)
Patient refused vitals at this time. Will try again later.

## 2021-12-25 NOTE — Consult Note (Addendum)
[facility] Face-to-Face Psychiatry Consult   Reason for Consult:  "Walking into traffic" Referring Physician:  EDP Patient Identification: Jackson [name] MRN:  [id number] Principal Diagnosis: Schizoaffective disorder, bipolar type (HCC) Diagnosis:  Principal Problem:   Schizoaffective disorder, bipolar type (HCC)   Total Time spent with patient: 1 hour  Subjective:   Jackson [name] is a 36 y.o. male patient admitted with bizarre behavior.  HPI:  Patient has hx of schizoaffective disorder, bipolar type and is historically non-compliant with his medications.  Per IVC report, patient was brought in under IVC by [employer] after he was reportedly walking into traffic late yesterday afternoon. He barricaded himself in his house when officers approached him. Officers gained entry into the home and brought him into the hospital.  He was loud, disruptive in triage. Patient required IM medications for agitation yesterday afternoon.   On approach today, patient is cooperative, but makes circular  motions with head as he snaps his fingers. Denies suicidal or homicidal ideations. Denies auditory or visual hallucinations, but is clearly responding to internal stimuli. Very poor reality testing. Patient is non-compliant with his medications and will need inpatient hospitalization for stabilization.     [name], [phone] (guardian): MOTHER IS INSISTING THAT PATIENT NOT BE REFERRED TO [facility] Please call guardian before patient is transferred anywhere.  Guardian states that she is in [place] at this time. She is aware that patient often fails to take his medications and will be in need of hospitalization for stabilization.     International aid/development worker ACT team [phone]).  Per their records, patient got Abilify Maintena 400 mg IM on 08/06/2021 and had refused it since then. He is also prescribed by ACT team:  Cogentin 1 mg BID; Depakote 1,000 mg every 12 hours;   Zyprexa 15 mg BID;  trazodone 25 mg at bedtime.   Past Psychiatric History: schizoaffective disorder  Risk to Self:   Risk to Others:   Prior Inpatient Therapy:   Prior Outpatient Therapy:    Past Medical History:  Past Medical History:  Diagnosis Date   Bipolar 1 disorder (HCC)    Schizoaffective disorder (HCC)    History reviewed. No pertinent surgical history. Family History: History reviewed. No pertinent family history. Family Psychiatric  History:  Social History:  Social History   Substance and Sexual Activity  Alcohol Use Not Currently     Social History   Substance and Sexual Activity  Drug Use Not Currently    Social History   Socioeconomic History   Marital status: Single    Spouse name: Not on file   Number of children: Not on file   Years of education: Not on file   Highest education level: Not on file  Occupational History   Not on file  Tobacco Use   Smoking status: Light Smoker    Packs/day: 0.25    Types: Cigarettes   Smokeless tobacco: Never  Vaping Use   Vaping Use: Never used  Substance and Sexual Activity   Alcohol use: Not Currently   Drug use: Not Currently   Sexual activity: Not on file  Other Topics Concern   Not on file  Social History Narrative   Not on file   Social Determinants of Health   Financial Resource Strain: Not on file  Food Insecurity: Not on file  Transportation Needs: Not on file  Physical Activity: Not on file  Stress: Not on file  Social Connections: Not on file   Additional Social History:  Allergies:  No Known Allergies  Labs: No results found for this or any previous visit (from the past 48 hour(s)).  Current Facility-Administered Medications  Medication Dose Route Frequency Provider Last Rate Last Admin   divalproex (DEPAKOTE) DR tablet 1,000 mg  1,000 mg Oral Q12H Ward, Kristen N, DO   1,000 mg at 12/25/21 0908   OLANZapine zydis (ZYPREXA) disintegrating tablet 15 mg  15 mg Oral BID Ward, Kristen N, DO   15 mg at  12/25/21 0908   traZODone (DESYREL) tablet 25 mg  25 mg Oral QHS Ward, Layla Maw, DO       Current Outpatient Medications  Medication Sig Dispense Refill   ARIPiprazole ER (ABILIFY MAINTENA) 400 MG SRER injection Inject 2 mLs (400 mg total) into the muscle every 28 (twenty-eight) days. (Patient not taking: Reported on 12/24/2021) 1 each 2   divalproex (DEPAKOTE) 500 MG DR tablet Take 2 tablets (1,000 mg total) by mouth every 12 (twelve) hours. (Patient not taking: Reported on 12/24/2021) 120 tablet 2   OLANZapine zydis (ZYPREXA) 15 MG disintegrating tablet Take 1 tablet (15 mg total) by mouth 2 (two) times daily. (Patient not taking: Reported on 12/24/2021) 60 tablet 2   traZODone (DESYREL) 50 MG tablet Take 0.5 tablets (25 mg total) by mouth at bedtime. (Patient not taking: Reported on 12/24/2021) 15 tablet 2    Musculoskeletal: Strength & Muscle Tone: within normal limits Gait & Station: normal Patient leans: N/A   Psychiatric Specialty Exam:  Presentation  General Appearance: Appropriate for Environment  Eye Contact:Good  Speech:Clear and Coherent  Speech Volume:Normal  Handedness:Right   Mood and Affect  Mood:Dysphoric  Affect:Blunt; Congruent   Thought Process  Thought Processes:Disorganized  Descriptions of Associations:Loose  Orientation:Full (Time, Place and Person)  Thought Content:Delusions; Scattered; Illogical  History of Schizophrenia/Schizoaffective disorder:Yes  Duration of Psychotic Symptoms:Greater than six months  Hallucinations:Hallucinations: -- (denies at this time)  Ideas of Reference:Delusions; Percusatory  Suicidal Thoughts:Suicidal Thoughts: No  Homicidal Thoughts:Homicidal Thoughts: No   Sensorium  Memory:Recent Poor; Immediate Fair  Judgment:Impaired  Insight:Poor   Executive Functions  Concentration:Fair  Attention Span:Fair  Recall:Fair  Fund of Knowledge:Fair  Language:Fair   Psychomotor Activity  Psychomotor  Activity:Psychomotor Activity: Normal   Assets  Assets:Financial Resources/Insurance; Housing; Resilience; Social Support   Sleep  Sleep:Sleep: Fair   Physical Exam: Physical Exam Vitals and nursing note reviewed.  HENT:     Head: Normocephalic.     Nose: No congestion or rhinorrhea.  Eyes:     General:        Right eye: No discharge.        Left eye: No discharge.  Cardiovascular:     Rate and Rhythm: Normal rate.  Pulmonary:     Effort: Pulmonary effort is normal.  Musculoskeletal:        General: Normal range of motion.     Cervical back: Normal range of motion.  Skin:    General: Skin is dry.  Neurological:     Mental Status: He is alert.  Psychiatric:        Attention and Perception: Attention normal.        Mood and Affect: Affect is inappropriate.        Speech: Speech normal.        Behavior: Behavior is cooperative.        Thought Content: Thought content is paranoid and delusional. Thought content does not include homicidal or suicidal ideation.        Cognition  and Memory: He exhibits impaired recent memory.        Judgment: Judgment is impulsive.   Review of Systems  Psychiatric/Behavioral:         Schizoaffective disorder, bipolar type  All other systems reviewed and are negative. Blood pressure 107/70, pulse 74, temperature 98.5 F (36.9 C), temperature source Oral, resp. rate 18, height 6' (1.829 m), weight 113 kg, SpO2 100 %. Body mass index is 33.79 kg/m.  Treatment Plan Summary: Daily contact with patient to assess and evaluate symptoms and progress in treatment and Medication management Patients home medications re-started as above, including LAI, Abilify Maintena 400. Reviewed with Dr. Toni Amendlapacs  Disposition: Recommend psychiatric Inpatient admission when medically cleared.  Vanetta MuldersLouise F Samanatha Brammer, NP 12/25/2021 10:45 AM

## 2021-12-25 NOTE — BH Assessment (Signed)
Referral checks:    [facility] [id number]), No answer    [facility] [phone]), No answer    [facility] [phone] -[phone]), Agreed to call back.    [facility] ([phone]), Per [facility] Billing, no beds available at this time.   [facility] [phone] or [phone] Not accepting outside pts at this time.    [facility] [phone] or [phone]), Facility only accepts geriatric patients.    [facility] [phone]). No answer

## 2021-12-25 NOTE — ED Notes (Signed)
Resumed care from ally rn.  Pt sleeping.   °

## 2021-12-25 NOTE — BH Assessment (Signed)
Adult MH  Referral information for Psychiatric Hospitalization faxed to:   [facility] [phone]- [phone]),   [facility] [phone] -or- [phone]),   [facility] [phone]),   [facility] [phone] or [phone])   [facility] [phone] or [phone]),   [facility] [phone]).

## 2021-12-25 NOTE — ED Notes (Signed)
Pt refusing VS at this time.

## 2021-12-25 NOTE — ED Notes (Signed)
Hospital meal provided.  100% consumed, pt tolerated w/o complaints.  Waste discarded appropriately.   

## 2021-12-25 NOTE — ED Notes (Signed)
Pt awake at this time, to restroom, unable to get cup to pt in tome for sample. Pt back to room, requesting snack and drink. Provided. Warm blankets to pt. Pt continues with delusional thinking, believes he has powers to make things happen with his mind

## 2021-12-25 NOTE — ED Notes (Signed)
Pt given snack tray. 

## 2021-12-25 NOTE — ED Notes (Signed)
IVC pending placement 

## 2021-12-25 NOTE — BH Assessment (Signed)
Comprehensive Clinical Assessment (CCA) Note  12/25/2021 Jackson [name] [id number]  Jackson [name], 36 year old male who presents to [facility] ED involuntarily for treatment. Per triage note, Pt to ED via [employer] under IVC for psych evaluation.  [employer]PD states patient was walking in traffic, when approached barricaded himself in his house and [employer] had to force entry.  Pt denies SI/HI, denies alcohol, states hemp use.  Pt has been verbally aggressive and loud in triage, yelling at police, and not wanting to answer most questions.  Pt is in police handcuffs and has stated that when he gets out of the cuffs he will mess someone up.   During TTS assessment pt presents alert and oriented x 4, restless but cooperative, and mood-congruent with affect. The pt appears to be responding to internal stimuli. Pt presenting with delusional thinking. Pt verified the information provided to triage RN.   Pt identifies his main complaint to be that the police broke into his home and brought him to the ED. Patient states they damaged his personal property and broke a window. Upon entering the room for assessment, patient was snapping his finger and moving his head up and down with quick motions. When asked, patient reports he is non-compliant with his medications and feels as though he does not need them. Patient presents with disorganized speech and bizarre behavior. Patient denies using any illicit substances and alcohol. Pt denies current SI/HI/AH/VH.    Per Sallye Ober, NP, pt is recommended for inpatient psychiatric admission.    Chief Complaint:  Chief Complaint  Patient presents with   Psychiatric Evaluation   Visit Diagnosis: Schizoaffective disorder, bipolar type    CCA Screening, Triage and Referral (STR)  Patient Reported Information How did you hear about [place]? -- Mudlogger)  Referral name: No data recorded Referral phone number: No data recorded  Whom do you see for routine medical problems? No data  recorded Practice/Facility Name: No data recorded Practice/Facility Phone Number: No data recorded Name of Contact: No data recorded Contact Number: No data recorded Contact Fax Number: No data recorded Prescriber Name: No data recorded Prescriber Address (if known): No data recorded  What Is the Reason for Your Visit/Call Today? Per IVC paperwork, patient was walking in and out of traffic and non compliant with medication.  How Long Has This Been Causing You Problems? > than 6 months  What Do You Feel Would Help You the Most Today? Medication(s)   Have You Recently Been in Any Inpatient Treatment (Hospital/Detox/Crisis Center/28-Day Program)? No data recorded Name/Location of Program/Hospital:No data recorded How Long Were You There? No data recorded When Were You Discharged? No data recorded  Have You Ever Received Services From [facility] Before? No data recorded Who Do You See at [facility]? No data recorded  Have You Recently Had Any Thoughts About Hurting Yourself? No  Are You Planning to Commit Suicide/Harm Yourself At This time? No   Have you Recently Had Thoughts About Hurting Someone Karolee Ohs? No  Explanation: No data recorded  Have You Used Any Alcohol or Drugs in the Past 24 Hours? No  How Long Ago Did You Use Drugs or Alcohol? No data recorded What Did You Use and How Much? No data recorded  Do You Currently Have a Therapist/Psychiatrist? Yes  Name of Therapist/Psychiatrist: [facility]   Have You Been Recently Discharged From Any Office Practice or Programs? No  Explanation of Discharge From Practice/Program: No data recorded    CCA Screening Triage Referral Assessment Type  of Contact: Face-to-Face  Is this Initial or Reassessment? No data recorded Date Telepsych consult ordered in [facility]:  No data recorded Time Telepsych consult ordered in [facility]:  No data recorded  Patient Reported Information Reviewed? No data recorded Patient Left Without Being Seen? No  data recorded Reason for Not Completing Assessment: No data recorded  Collateral Involvement: Crystal Dunaj-Legal Guardian   Does Patient Have a Automotive engineer Guardian? No data recorded Name and Contact of Legal Guardian: No data recorded If Minor and Not Living with Parent(s), Who has Custody? No data recorded Is CPS involved or ever been involved? Never  Is APS involved or ever been involved? Never   Patient Determined To Be At Risk for Harm To Self or Others Based on Review of Patient Reported Information or Presenting Complaint? No  Method: No data recorded Availability of Means: No data recorded Intent: No data recorded Notification Required: No data recorded Additional Information for Danger to Others Potential: No data recorded Additional Comments for Danger to Others Potential: No data recorded Are There Guns or Other Weapons in Your Home? No data recorded Types of Guns/Weapons: No data recorded Are These Weapons Safely Secured?                            No data recorded Who Could Verify You Are Able To Have These Secured: No data recorded Do You Have any Outstanding Charges, Pending Court Dates, Parole/Probation? No data recorded Contacted To Inform of Risk of Harm To Self or Others: No data recorded  Location of Assessment: [facility] ED   Does Patient Present under Involuntary Commitment? Yes  IVC Papers Initial File Date: 12/24/21   [place] of Residence: [place]   Patient Currently Receiving the Following Services: ACTT Engineer, agricultural Treatment); Medication Management; Intensive-in-Home Services   Determination of Need: Emergent (2 hours)   Options For Referral: Medication Management; Inpatient Hospitalization; ED Visit     CCA Biopsychosocial Intake/Chief Complaint:  No data recorded Current Symptoms/Problems: No data recorded  Patient Reported Schizophrenia/Schizoaffective Diagnosis in Past: Yes   Strengths: Have some insight into  his problems. Able to verbalize and communicate needs and wants.  Preferences: No data recorded Abilities: No data recorded  Type of Services Patient Feels are Needed: No data recorded  Initial Clinical Notes/Concerns: No data recorded  Mental Health Symptoms Depression:   None   Duration of Depressive symptoms: No data recorded  Mania:   Change in energy/activity; Increased Energy; Overconfidence   Anxiety:    None   Psychosis:   Hallucinations; Grossly disorganized speech; Grossly disorganized or catatonic behavior; Delusions   Duration of Psychotic symptoms:  Greater than six months   Trauma:   None   Obsessions:   None   Compulsions:   Disrupts with routine/functioning   Inattention:   Poor follow-through on tasks; Disorganized   Hyperactivity/Impulsivity:   Always on the go; Blurts out answers; Difficulty waiting turn; Feeling of restlessness; Fidgets with hands/feet   Oppositional/Defiant Behaviors:   None   Emotional Irregularity:   Potentially harmful impulsivity; Intense/inappropriate anger   Other Mood/Personality Symptoms:   None noted    Mental Status Exam Appearance and self-care  Stature:   Average   Weight:   Average weight   Clothing:   Age-appropriate   Grooming:   Normal   Cosmetic use:   None   Posture/gait:   Normal   Motor activity:   Not Remarkable  Sensorium  Attention:   Normal   Concentration:   Scattered   Orientation:   X5   Recall/memory:   Normal   Affect and Mood  Affect:   Appropriate; Full Range   Mood:   Anxious   Relating  Eye contact:   Normal   Facial expression:   Constricted; Anxious   Attitude toward examiner:   Cooperative; Guarded   Thought and Language  Speech flow:  Blocked; Clear and Coherent   Thought content:   Appropriate to Mood and Circumstances; Delusions   Preoccupation:   None   Hallucinations:   None   Organization:  No data recorded  [employer] of Knowledge:   Fair   Intelligence:   Average   Abstraction:   Functional   Judgement:   Impaired   Reality Testing:   Distorted   Insight:   Poor   Decision Making:   Confused   Social Functioning  Social Maturity:   Self-centered   Social Judgement:   Impropriety; "Chief of Staff"   Stress  Stressors:   Housing; Illness   Coping Ability:   Deficient supports   Skill Deficits:   Decision making   Supports:   Family     Religion:    Leisure/Recreation:    Exercise/Diet: Exercise/Diet Do You Follow a Special Diet?: No Do You Have Any Trouble Sleeping?: No   CCA Employment/Education Employment/Work Situation: Employment / Work Systems developer: On disability Why is Patient on Disability: Schizophrenia How Long has Patient Been on Disability: Several years Has Patient ever Been in the [employer]?: No  Education: Education Is Patient Currently Attending School?: No   CCA Family/Childhood History Family and Relationship History: Family history Does patient have children?: No  Childhood History:  Childhood History By whom was/is the patient raised?: Both parents  Child/Adolescent Assessment:     CCA Substance Use Alcohol/Drug Use: Alcohol / Drug Use Pain Medications: See PTA Prescriptions: See PTA Over the Counter: See PTA History of alcohol / drug use?: No history of alcohol / drug abuse Longest period of sobriety (when/how long): n/a                         ASAM's:  Six Dimensions of Multidimensional Assessment  Dimension 1:  Acute Intoxication and/or Withdrawal Potential:      Dimension 2:  Biomedical Conditions and Complications:      Dimension 3:  Emotional, Behavioral, or Cognitive Conditions and Complications:     Dimension 4:  Readiness to Change:     Dimension 5:  Relapse, Continued use, or Continued Problem Potential:     Dimension 6:  Recovery/Living Environment:     ASAM  Severity Score:    ASAM Recommended Level of Treatment:     Substance use Disorder (SUD)    Recommendations for Services/Supports/Treatments:    DSM5 Diagnoses: Patient Active Problem List   Diagnosis Date Noted   Noncompliance 10/18/2018   Schizoaffective disorder, bipolar type (HCC) 10/17/2018    Patient Centered Plan: Patient is on the following Treatment Plan(s):     Referrals to Alternative Service(s): Referred to Alternative Service(s):   Place:   Date:   Time:    Referred to Alternative Service(s):   Place:   Date:   Time:    Referred to Alternative Service(s):   Place:   Date:   Time:    Referred to Alternative Service(s):   Place:   Date:  Time:      @BHCOLLABOFCARE @  Clerance Lavemetria R Lopaka Karge, Counselor, LCAS-A

## 2021-12-25 NOTE — ED Provider Notes (Signed)
Emergency Medicine Observation Re-evaluation Note  Jackson [name] is a 36 y.o. male, seen on rounds today.  Pt initially presented to the ED for complaints of Psychiatric Evaluation Currently, the patient is sleeping.  Physical Exam  BP 127/89 (BP Location: Left Arm)   Pulse 89   Resp (!) 22   Ht 6' (1.829 m)   Wt 113 kg   SpO2 100%   BMI 33.79 kg/m  Physical Exam Gen: No acute distress  Resp: Normal rise and fall of chest Neuro: Moving all four extremities Psych: Resting currently, agitated earlier when awake requiring chemical sedation  ED Course / MDM  EKG:   I have reviewed the labs performed to date as well as medications administered while in observation.  Recent changes in the last 24 hours include no acute events overnight.  Plan  Current plan is for psychiatric disposition. Jackson [name] is under involuntary commitment.      Jackson [name], Layla Maw, DO 12/25/21 [phone]

## 2021-12-26 MED ORDER — LORAZEPAM 2 MG PO TABS
2.0000 mg | ORAL_TABLET | Freq: Four times a day (QID) | ORAL | Status: DC | PRN
  Administered 2021-12-26 – 2022-03-09 (×5): 2 mg via ORAL
  Filled 2021-12-26 (×6): qty 1

## 2021-12-26 NOTE — ED Notes (Signed)
Pt refused vital signs.

## 2021-12-26 NOTE — ED Notes (Signed)
Patient refused vsigns 

## 2021-12-26 NOTE — ED Notes (Addendum)
This nurse spoke with pt parents on the phone for status update. They expressed concerns regarding pt being transferred to [facility] and told this nurse pt would be out on the streets within a day and they are worried he would be shot or hit by a car to due to his erratic and seemingly aggressive behavior. Requested that they be called before any decisions be made for admission or transfer and encouraged admission to [facility] behavioral med unit instead of sending him out to another facility. They have called Dr. Toni Amend and informed his office of pt arrival to this hospital since he has seen this pt before for treatment.

## 2021-12-26 NOTE — ED Notes (Signed)
Patient refused to get his VS. Will try later.

## 2021-12-26 NOTE — Consult Note (Signed)
[facility] Psych ED Progress Note  12/26/2021 12:23 PM Jackson [name]  MRN:  [id number]   Method of visit?: Face to Face  reassessment  Subjective:  "I'm good, how are you?"  Patient observed walking slowly in the hallway. He has not been aggressive, but appears somewhat anxious. Patient is taking his medications as prescribed. Writer added Ativan 2 mg every 6 hours as an "As Needed" medication.  Patient continues to snap his fingers and turn his head in circular motions for no apparent reason.    Principal Problem: Schizoaffective disorder, bipolar type (HCC) Diagnosis:  Principal Problem:   Schizoaffective disorder, bipolar type (HCC)  Total Time spent with patient: 15 minutes  Past Psychiatric History: see previous  Past Medical History:  Past Medical History:  Diagnosis Date   Bipolar 1 disorder (HCC)    Schizoaffective disorder (HCC)    History reviewed. No pertinent surgical history. Family History: History reviewed. No pertinent family history. Family Psychiatric  History:  Social History:  Social History   Substance and Sexual Activity  Alcohol Use Not Currently     Social History   Substance and Sexual Activity  Drug Use Not Currently    Social History   Socioeconomic History   Marital status: Single    Spouse name: Not on file   Number of children: Not on file   Years of education: Not on file   Highest education level: Not on file  Occupational History   Not on file  Tobacco Use   Smoking status: Light Smoker    Packs/day: 0.25    Types: Cigarettes   Smokeless tobacco: Never  Vaping Use   Vaping Use: Never used  Substance and Sexual Activity   Alcohol use: Not Currently   Drug use: Not Currently   Sexual activity: Not on file  Other Topics Concern   Not on file  Social History Narrative   Not on file   Social Determinants of Health   Financial Resource Strain: Not on file  Food Insecurity: Not on file  Transportation Needs: Not on file   Physical Activity: Not on file  Stress: Not on file  Social Connections: Not on file    Sleep: Good  Appetite:  Good  Current Medications: Current Facility-Administered Medications  Medication Dose Route Frequency Provider Last Rate Last Admin   ARIPiprazole (ABILIFY) tablet 5 mg  5 mg Oral QHS Gabriel Cirri F, NP   5 mg at 12/25/21 2142   ARIPiprazole ER (ABILIFY MAINTENA) injection 400 mg  400 mg Intramuscular Q28 days Gabriel Cirri F, NP   400 mg at 12/25/21 1206   benztropine (COGENTIN) tablet 1 mg  1 mg Oral BID Gabriel Cirri F, NP   1 mg at 12/26/21 0913   divalproex (DEPAKOTE) DR tablet 1,000 mg  1,000 mg Oral Q12H Ward, Kristen N, DO   1,000 mg at 12/26/21 0907   LORazepam (ATIVAN) tablet 2 mg  2 mg Oral Q6H PRN Vanetta Mulders, NP   2 mg at 12/26/21 0904   OLANZapine zydis (ZYPREXA) disintegrating tablet 15 mg  15 mg Oral BID Ward, Kristen N, DO   15 mg at 12/26/21 0908   traZODone (DESYREL) tablet 25 mg  25 mg Oral QHS Ward, Kristen N, DO   25 mg at 12/25/21 2143   Current Outpatient Medications  Medication Sig Dispense Refill   ARIPiprazole ER (ABILIFY MAINTENA) 400 MG SRER injection Inject 2 mLs (400 mg total) into the muscle every 28 (twenty-eight) days. (  Patient not taking: Reported on 12/24/2021) 1 each 2   divalproex (DEPAKOTE) 500 MG DR tablet Take 2 tablets (1,000 mg total) by mouth every 12 (twelve) hours. (Patient not taking: Reported on 12/24/2021) 120 tablet 2   OLANZapine zydis (ZYPREXA) 15 MG disintegrating tablet Take 1 tablet (15 mg total) by mouth 2 (two) times daily. (Patient not taking: Reported on 12/24/2021) 60 tablet 2   traZODone (DESYREL) 50 MG tablet Take 0.5 tablets (25 mg total) by mouth at bedtime. (Patient not taking: Reported on 12/24/2021) 15 tablet 2    Lab Results:  Results for orders placed or performed during the hospital encounter of 12/24/21 (from the past 48 hour(s))  Resp Panel by RT-PCR (Flu A&B, Covid) Nasopharyngeal Swab      Status: None   Collection Time: 12/24/21  5:45 PM   Specimen: Nasopharyngeal Swab; Nasopharyngeal(NP) swabs in vial transport medium  Result Value Ref Range   SARS Coronavirus 2 by RT PCR NEGATIVE NEGATIVE    Comment: (NOTE) SARS-CoV-2 target nucleic acids are NOT DETECTED.  The SARS-CoV-2 RNA is generally detectable in upper respiratory specimens during the acute phase of infection. The lowest concentration of SARS-CoV-2 viral copies this assay can detect is 138 copies/mL. A negative result does not preclude SARS-Cov-2 infection and should not be used as the sole basis for treatment or other patient management decisions. A negative result may occur with  improper specimen collection/handling, submission of specimen other than nasopharyngeal swab, presence of viral mutation(s) within the areas targeted by this assay, and inadequate number of viral copies(<138 copies/mL). A negative result must be combined with clinical observations, patient history, and epidemiological information. The expected result is Negative.  Fact Sheet for Patients:  BloggerCourse.com  Fact Sheet for Healthcare Providers:  SeriousBroker.it  This test is no t yet approved or cleared by the [place] FDA and  has been authorized for detection and/or diagnosis of SARS-CoV-2 by FDA under an Emergency Use Authorization (EUA). This EUA will remain  in effect (meaning this test can be used) for the duration of the COVID-19 declaration under Section 564(b)(1) of the Act, 21 U.S.C.section 360bbb-3(b)(1), unless the authorization is terminated  or revoked sooner.       Influenza A by PCR NEGATIVE NEGATIVE   Influenza B by PCR NEGATIVE NEGATIVE    Comment: (NOTE) The Xpert Xpress SARS-CoV-2/FLU/RSV plus assay is intended as an aid in the diagnosis of influenza from Nasopharyngeal swab specimens and should not be used as a sole basis for treatment. Nasal  washings and aspirates are unacceptable for Xpert Xpress SARS-CoV-2/FLU/RSV testing.  Fact Sheet for Patients: BloggerCourse.com  Fact Sheet for Healthcare Providers: SeriousBroker.it  This test is not yet approved or cleared by the [place] FDA and has been authorized for detection and/or diagnosis of SARS-CoV-2 by FDA under an Emergency Use Authorization (EUA). This EUA will remain in effect (meaning this test can be used) for the duration of the COVID-19 declaration under Section 564(b)(1) of the Act, 21 U.S.C. section 360bbb-3(b)(1), unless the authorization is terminated or revoked.  Performed at [facility], [address] [place], [place] [address]     Blood Alcohol level:  Lab Results  Component Value Date   [facility] <10 02/13/2021   ETH <10 01/20/2021    Physical Findings: AIMS:  , ,  ,  ,    CIWA:    COWS:     Musculoskeletal: Strength & Muscle Tone: within normal limits Gait & Station: normal Patient  leans: N/A  Psychiatric Specialty Exam:  Presentation  General Appearance: Appropriate for Environment  Eye Contact:Good  Speech:Clear and Coherent  Speech Volume:Normal  Handedness:Right   Mood and Affect  Mood:Dysphoric  Affect:Blunt; Congruent   Thought Process  Thought Processes:Disorganized  Descriptions of Associations:Loose  Orientation:Full (Time, Place and Person)  Thought Content:Delusions; Scattered; Illogical  History of Schizophrenia/Schizoaffective disorder:Yes  Duration of Psychotic Symptoms:Greater than six months  Hallucinations:Hallucinations: -- (denies at this time)  Ideas of Reference:Delusions; Percusatory  Suicidal Thoughts:Suicidal Thoughts: No  Homicidal Thoughts:Homicidal Thoughts: No   Sensorium  Memory:Recent Poor; Immediate Fair  Judgment:Impaired  Insight:Poor   Executive Functions  Concentration:Fair  Attention  Span:Fair  Recall:Fair  Fund of Knowledge:Fair  Language:Fair   Psychomotor Activity  Psychomotor Activity:Psychomotor Activity: Normal   Assets  Assets:Financial Resources/Insurance; Housing; Resilience; Social Support   Sleep  Sleep:Sleep: Fair    Physical Exam: Physical Exam Vitals and nursing note reviewed.  HENT:     Head: Normocephalic.     Nose: No congestion or rhinorrhea.  Eyes:     General:        Right eye: No discharge.        Left eye: No discharge.  Cardiovascular:     Rate and Rhythm: Normal rate.  Pulmonary:     Effort: Pulmonary effort is normal.  Psychiatric:        Attention and Perception: He perceives auditory and visual hallucinations.        Mood and Affect: Affect is labile.        Speech: Speech normal.        Thought Content: Thought content is paranoid and delusional.        Cognition and Memory: Cognition is impaired. He exhibits impaired recent memory.        Judgment: Judgment is impulsive.   Review of Systems  Psychiatric/Behavioral:         Schizoaffective disorder  All other systems reviewed and are negative. Blood pressure 107/70, pulse 74, temperature 98.5 F (36.9 C), temperature source Oral, resp. rate 18, height 6' (1.829 m), weight 113 kg, SpO2 100 %. Body mass index is 33.79 kg/m.  Treatment Plan Summary: Patient needs inpatient hospitalization for stabilization and continued treatment.   Vanetta MuldersLouise F Adeena Bernabe, NP 12/26/2021, 12:23 PM

## 2021-12-26 NOTE — ED Notes (Signed)
Pt resting comfortable with no acute distress; will try and obtain vitals when pt awakes.

## 2021-12-26 NOTE — ED Notes (Signed)
Report to include situation, background, assessment and recommendations from [employer]. Patient sleeping, respirations regular and unlabored. Q15 minute rounds and security camera observation to continue.

## 2021-12-26 NOTE — ED Notes (Addendum)
Pt remains in hallway. Pacing and talking loudly. Cooperative when taking medications and not acting aggressively towards staff but resistant to going back into exam room.

## 2021-12-26 NOTE — ED Notes (Signed)
Hospital meal provided, pt tolerated w/o complaints.  Waste discarded appropriately.  

## 2021-12-26 NOTE — ED Notes (Signed)
Pt give meal tray and beverage

## 2021-12-26 NOTE — ED Notes (Signed)
Pt out in hallway pacing and restless. Frequent head jerking movements noted. Occasionally seen jumping and dancing; talking loudly.

## 2021-12-26 NOTE — ED Provider Notes (Signed)
Emergency Medicine Observation Re-evaluation Note  Jackson [name] is a 36 y.o. male, seen on rounds today.  Pt initially presented to the ED for complaints of Psychiatric Evaluation Currently, the patient is resting.  Physical Exam  BP 107/70 (BP Location: Left Arm)   Pulse 74   Temp 98.5 F (36.9 C) (Oral)   Resp 18   Ht 1.829 m (6')   Wt 113 kg   SpO2 100%   BMI 33.79 kg/m  Physical Exam Gen:  No acute distress Resp:  Breathing easily and comfortably, no accessory muscle usage Neuro:  Moving all four extremities, no gross focal neuro deficits Psych:  Resting currently, occasionally argumentative when awake.  ED Course / MDM  EKG:   I have reviewed the labs performed to date as well as medications administered while in observation.  Recent changes in the last 24 hours include no significant changes.  Plan  Current plan is for psychiatric treatment at an outside facility. Jackson [name] is under involuntary commitment.      Loleta Rose, MD 12/26/21 [phone]

## 2021-12-26 NOTE — ED Notes (Signed)
Pt in room after given a snack; fidgety and pacing but redirectable for a brief period of time

## 2021-12-26 NOTE — ED Notes (Signed)
INVOLUNTARY/awaiting placement °

## 2021-12-27 NOTE — ED Notes (Signed)
Pt refused vitals, RN made aware

## 2021-12-27 NOTE — ED Notes (Signed)
IVC pending placement 

## 2021-12-27 NOTE — ED Notes (Signed)
Pt received snack and drink 

## 2021-12-27 NOTE — ED Notes (Signed)
Snack and beverage given. 

## 2021-12-27 NOTE — ED Notes (Signed)
Report to include Situation, Background, Assessment, and Recommendations received from Crystal RN. Patient alert and oriented, warm and dry, in no acute distress. Patient denies SI, HI, AVH and pain. Patient made aware of Q15 minute rounds and security cameras for their safety. Patient instructed to come to me with needs or concerns.  

## 2021-12-27 NOTE — ED Notes (Signed)
Dinner tray provided to patient who is being calm and cooperative 

## 2021-12-27 NOTE — ED Notes (Signed)
Pt's breakfast tray has arrived, pt awoken. Pt sat up and eating breakfast tray. Writer introduced themselves to the pt.

## 2021-12-27 NOTE — Consult Note (Signed)
[facility]  12/27/2021 3:13 PM Jackson [name]  MRN:  [id number]  Subjective:  "Same as usual."  Patient quiet and staying in his room on his bed.  Compliant with his medications, calm and cooperative.  He is quiet on assessment and his only request was for food, breakfast on the way.  He stopped taking his medications in Jan/Feb and decompensated.  Continues to have hallucinations and grandiosity.  Psych placement continues to be sought.  Principal Problem: Schizoaffective disorder, bipolar type (HCC) Diagnosis:  Principal Problem:   Schizoaffective disorder, bipolar type (HCC)  Total Time spent with patient: 15 minutes  Past Psychiatric History: see previous  Past Medical History:  Past Medical History:  Diagnosis Date   Bipolar 1 disorder (HCC)    Schizoaffective disorder (HCC)    History reviewed. No pertinent surgical history. Family History: History reviewed. No pertinent family history. Family Psychiatric  History: none Social History:  Social History   Substance and Sexual Activity  Alcohol Use Not Currently     Social History   Substance and Sexual Activity  Drug Use Not Currently    Social History   Socioeconomic History   Marital status: Single    Spouse name: Not on file   Number of children: Not on file   Years of education: Not on file   Highest education level: Not on file  Occupational History   Not on file  Tobacco Use   Smoking status: Light Smoker    Packs/day: 0.25    Types: Cigarettes   Smokeless tobacco: Never  Vaping Use   Vaping Use: Never used  Substance and Sexual Activity   Alcohol use: Not Currently   Drug use: Not Currently   Sexual activity: Not on file  Other Topics Concern   Not on file  Social History Narrative   Not on file   Social Determinants of Health   Financial Resource Strain: Not on file  Food Insecurity: Not on file  Transportation Needs: Not on file  Physical Activity: Not on file  Stress:  Not on file  Social Connections: Not on file    Sleep: Good  Appetite:  Good  Current Medications: Current Facility-Administered Medications  Medication Dose Route Frequency Provider Last Rate Last Admin   ARIPiprazole (ABILIFY) tablet 5 mg  5 mg Oral QHS Gabriel CirriBarthold, Louise F, NP   5 mg at 12/26/21 2149   ARIPiprazole ER (ABILIFY MAINTENA) injection 400 mg  400 mg Intramuscular Q28 days Gabriel CirriBarthold, Louise F, NP   400 mg at 12/25/21 1206   benztropine (COGENTIN) tablet 1 mg  1 mg Oral BID Gabriel CirriBarthold, Louise F, NP   1 mg at 12/26/21 2149   divalproex (DEPAKOTE) DR tablet 1,000 mg  1,000 mg Oral Q12H Ward, Kristen N, DO   1,000 mg at 12/26/21 2149   LORazepam (ATIVAN) tablet 2 mg  2 mg Oral Q6H PRN Vanetta MuldersBarthold, Louise F, NP   2 mg at 12/26/21 0904   OLANZapine zydis (ZYPREXA) disintegrating tablet 15 mg  15 mg Oral BID Ward, Kristen N, DO   15 mg at 12/26/21 2149   traZODone (DESYREL) tablet 25 mg  25 mg Oral QHS Ward, Kristen N, DO   25 mg at 12/26/21 2149   Current Outpatient Medications  Medication Sig Dispense Refill   ARIPiprazole ER (ABILIFY MAINTENA) 400 MG SRER injection Inject 2 mLs (400 mg total) into the muscle every 28 (twenty-eight) days. (Patient not taking: Reported on 12/24/2021) 1 each 2  divalproex (DEPAKOTE) 500 MG DR tablet Take 2 tablets (1,000 mg total) by mouth every 12 (twelve) hours. (Patient not taking: Reported on 12/24/2021) 120 tablet 2   OLANZapine zydis (ZYPREXA) 15 MG disintegrating tablet Take 1 tablet (15 mg total) by mouth 2 (two) times daily. (Patient not taking: Reported on 12/24/2021) 60 tablet 2   traZODone (DESYREL) 50 MG tablet Take 0.5 tablets (25 mg total) by mouth at bedtime. (Patient not taking: Reported on 12/24/2021) 15 tablet 2    Lab Results:  No results found for this or any previous visit (from the past 48 hour(s)).   Blood Alcohol level:  Lab Results  Component Value Date   ETH <10 02/13/2021   ETH <10 01/20/2021    Physical Findings: AIMS:   , ,  ,  ,    CIWA:    COWS:     Musculoskeletal: Strength & Muscle Tone: within normal limits Gait & Station: normal Patient leans: N/A  Psychiatric Specialty Exam: Physical Exam Vitals and nursing note reviewed.  HENT:     Head: Normocephalic.     Nose: No congestion or rhinorrhea.  Eyes:     General:        Right eye: No discharge.        Left eye: No discharge.  Cardiovascular:     Rate and Rhythm: Normal rate.  Pulmonary:     Effort: Pulmonary effort is normal.  Musculoskeletal:        General: Normal range of motion.  Neurological:     General: No focal deficit present.  Psychiatric:        Attention and Perception: He perceives auditory and visual hallucinations.        Mood and Affect: Affect is blunt.        Speech: Speech normal.        Behavior: Behavior normal. Behavior is cooperative.        Thought Content: Thought content is paranoid and delusional.        Cognition and Memory: Cognition is impaired. He exhibits impaired recent memory.        Judgment: Judgment is impulsive.    Review of Systems  Psychiatric/Behavioral:  Positive for hallucinations.        Schizoaffective disorder  All other systems reviewed and are negative.  Blood pressure 107/70, pulse 74, temperature 98.5 F (36.9 C), temperature source Oral, resp. rate 18, height 6' (1.829 m), weight 113 kg, SpO2 100 %.Body mass index is 33.79 kg/m.  General Appearance: Casual  Eye Contact:  Fair  Speech:  Normal Rate  Volume:  Normal  Mood:  Depressed  Affect:  Flat  Thought Process:  Coherent and Descriptions of Associations: Intact  Orientation:  Full (Time, Place, and Person)  Thought Content:  Delusions and Hallucinations: Auditory  Suicidal Thoughts:  No  Homicidal Thoughts:  No  Memory:  Immediate;   Fair Recent;   Fair Remote;   Fair  Judgement:  Poor  Insight:  Lacking  Psychomotor Activity:  Decreased  Concentration:  Concentration: Fair and Attention Span: Fair  Recall:  [employer] of Knowledge:  Fair  Language:  Good  Akathisia:  No  Handed:  Right  AIMS (if indicated):     Assets:  Housing Leisure Time Physical Health Resilience Social Support  ADL's:  Intact  Cognition:  Impaired,  Mild  Sleep:         Physical Exam: Physical Exam Vitals and nursing note reviewed.  HENT:  Head: Normocephalic.     Nose: No congestion or rhinorrhea.  Eyes:     General:        Right eye: No discharge.        Left eye: No discharge.  Cardiovascular:     Rate and Rhythm: Normal rate.  Pulmonary:     Effort: Pulmonary effort is normal.  Musculoskeletal:        General: Normal range of motion.  Neurological:     General: No focal deficit present.  Psychiatric:        Attention and Perception: He perceives auditory and visual hallucinations.        Mood and Affect: Affect is blunt.        Speech: Speech normal.        Behavior: Behavior normal. Behavior is cooperative.        Thought Content: Thought content is paranoid and delusional.        Cognition and Memory: Cognition is impaired. He exhibits impaired recent memory.        Judgment: Judgment is impulsive.   Review of Systems  Psychiatric/Behavioral:  Positive for hallucinations.        Schizoaffective disorder  All other systems reviewed and are negative. Blood pressure 107/70, pulse 74, temperature 98.5 F (36.9 C), temperature source Oral, resp. rate 18, height 6' (1.829 m), weight 113 kg, SpO2 100 %. Body mass index is 33.79 kg/m.  Treatment Plan Summary: Schizoaffective disorder, bipolar type: Continue Zyprexa 15 mg BID Continue Abilify 5 mg daily at bedtime Continue Depakote 1000 mg BID  Anxiety: Continue Ativan 1 mg every six hours PRN  EPS: Continue Cogentin 1 mg BID  Insomnia: Continue Trazodone 25 mg at bedtime  Patient needs inpatient hospitalization for stabilization and continued treatment.   Nanine Means, NP 12/27/2021, 3:13 PM

## 2021-12-27 NOTE — ED Notes (Signed)
Lunch and drink given.  

## 2021-12-27 NOTE — ED Notes (Signed)
Pt is very anxious. Walking the floors and knocking on the windows as this time. Will give PRN ativan to see if this helps.

## 2021-12-27 NOTE — ED Notes (Signed)
Writer provided pt his snack-bagel w/cream cheese and a water. Writer also took out all the previous trays from pt's room. Pt repeatedly asked for the time, day and month. When Probation officer or security answered, pt would respond "today is May?", "is it 12 already?" And "are we at am or pm?"

## 2021-12-27 NOTE — ED Notes (Signed)
Pt is refusing to take the PO ativan at this time.

## 2021-12-27 NOTE — ED Notes (Signed)
Patient took all medications with much coaxing, he states " I don't take pills, and kept shaking his head and snapping his fingers. Saying things that were very inappropriate to nurse, nurse told him she would bring medications back and He said " I will take them" nurse will continue to monitor.

## 2021-12-27 NOTE — ED Provider Notes (Signed)
Emergency Medicine Observation Re-evaluation Note  Jackson [name] is a 36 y.o. male, seen on rounds today.    Physical Exam  BP 107/70 (BP Location: Left Arm)   Pulse 74   Temp 98.5 F (36.9 C) (Oral)   Resp 18   Ht 6' (1.829 m)   Wt 113 kg   SpO2 100%   BMI 33.79 kg/m  Physical Exam General: Patient resting comfortably in bed Lungs: Patient in no respiratory distress Psych: Patient is not combative  ED Course / MDM  EKG:     Plan  Current plan is for psychiatric evaluation and treatment Jackson [name] is under involuntary commitment.      Jackson Natal, MD 12/27/21 [phone]

## 2021-12-28 NOTE — ED Notes (Signed)
Patient refused his VS. Will try later. Marland Kitchen

## 2021-12-28 NOTE — ED Notes (Signed)
Report to include Situation, Background, Assessment, and Recommendations received from Amy RN. Patient alert and oriented, warm and dry, in no acute distress. Patient denies SI, HI, AVH and pain. Patient made aware of Q15 minute rounds and security cameras for their safety. Patient instructed to come to me with needs or concerns.  

## 2021-12-28 NOTE — BH Assessment (Signed)
Referral information refaxed for inpatient treatment.

## 2021-12-28 NOTE — ED Notes (Signed)
Pt given snack. 

## 2021-12-28 NOTE — ED Notes (Addendum)
Pt with bizarre behavior, asking staff not to walk out the back door of his house.  Pt snapping and making quick head movments. Unable to have conversation with patient at this time.

## 2021-12-28 NOTE — ED Notes (Signed)
Dinner tray given

## 2021-12-28 NOTE — ED Notes (Signed)
Pt is currently asleep at this time. Will get VS and snack when awake

## 2021-12-28 NOTE — ED Provider Notes (Signed)
Emergency Medicine Observation Re-evaluation Note  Jackson [name] is a 36 y.o. male, seen on rounds today.    Physical Exam  BP (!) 125/91 (BP Location: Right Arm)   Pulse 65   Temp 98 F (36.7 C) (Oral)   Resp 18   Ht 6' (1.829 m)   Wt 113 kg   SpO2 98%   BMI 33.79 kg/m  Physical Exam General: Patient resting comfortably on the bed Lungs: Patient in no respiratory distress Psych: Patient not combative  ED Course / MDM  EKG:     Plan  Current plan is for psychiatric evaluation and treatment Jackson [name] is under involuntary commitment.      Arnaldo Natal, MD 12/28/21 [phone]

## 2021-12-28 NOTE — ED Notes (Signed)
Pt received snack and drink 

## 2021-12-28 NOTE — ED Notes (Signed)
Pt given breakfast tray and beverage.  

## 2021-12-28 NOTE — ED Notes (Addendum)
Pt pacing around unit.  No needs expressed at this time.

## 2021-12-28 NOTE — ED Notes (Signed)
IVC/Pending Placement 

## 2021-12-29 NOTE — ED Notes (Signed)
Report to include Situation, Background, Assessment, and Recommendations received from Ariel RN. Patient alert and oriented, warm and dry, in no acute distress. Patient denies SI, HI, AVH and pain. Patient made aware of Q15 minute rounds and security cameras for their safety. Patient instructed to come to me with needs or concerns.  

## 2021-12-29 NOTE — ED Notes (Signed)
Pt received dinner tray.

## 2021-12-29 NOTE — ED Notes (Signed)
Pt given lunch tray.

## 2021-12-29 NOTE — ED Notes (Signed)
PT resting comfortably with no signs of acute distress 

## 2021-12-29 NOTE — ED Notes (Signed)
Pt refused vitals 

## 2021-12-29 NOTE — ED Notes (Signed)
IVC pending placement 

## 2021-12-29 NOTE — ED Provider Notes (Signed)
Emergency Medicine Observation Re-evaluation Note  Jackson [name] is a 35 y.o. male, seen on rounds today.  Pt initially presented to the ED for complaints of Psychiatric Evaluation Currently, the patient is resting.  Physical Exam  BP (!) 140/98   Pulse 73   Temp 97.8 F (36.6 C)   Resp 18   Ht 6' (1.829 m)   Wt 113 kg   SpO2 100%   BMI 33.79 kg/m  Physical Exam General: No acute distress Cardiac: Well-perfused extremities Lungs: No respiratory distress Psych: Appropriate mood and affect  ED Course / MDM  EKG:   I have reviewed the labs performed to date as well as medications administered while in observation.  Recent changes in the last 24 hours include none.  Plan  Current plan is for placement. Jackson [name] is under involuntary commitment.      Merwyn Katos, MD 12/29/21 [phone]

## 2021-12-29 NOTE — ED Notes (Signed)
Pt received breakfast 

## 2021-12-29 NOTE — ED Notes (Signed)
PT given snack and beverage 

## 2021-12-29 NOTE — ED Notes (Signed)
Patient is in the shower when asked to come out for vitals and snack, he said " I am air drying and I will come out". Will check him later.

## 2021-12-30 NOTE — ED Notes (Signed)
Patient transferred from [facility] to room 21 after dressing out and screening for contraband. Report received from Maudie Mercury, RN including situation, background, assessment and recommendations. Pt oriented to [employer] including Q15 minute rounds as well as Engineer, drilling for their protection. Patient is alert and oriented, warm and dry in no acute distress. Patient denies SI, HI, and AVH. Pt. Encouraged to let this nurse know if needs arise. Pt to restroom and provided urine cup, exits and states he forgot to provide sample. Pt given drink and apple sauce

## 2021-12-30 NOTE — ED Notes (Signed)
Pt given breakfast.

## 2021-12-30 NOTE — ED Notes (Signed)
Patient came out from the shower. He refused vital signs.

## 2021-12-30 NOTE — ED Provider Notes (Signed)
Emergency Medicine Observation Re-evaluation Note  Jackson [name] is a 36 y.o. male, seen on rounds today.  Pt initially presented to the ED for complaints of Psychiatric Evaluation   Physical Exam  BP (!) 140/98   Pulse 73   Temp 97.8 F (36.6 C)   Resp 18   Ht 6' (1.829 m)   Wt 113 kg   SpO2 100%   BMI 33.79 kg/m  Physical Exam General: nad Cardiac: well perfused Lungs: unlabored Psych: resting  ED Course / MDM  EKG:   I have reviewed the labs performed to date as well as medications administered while in observation.  Recent changes in the last 24 hours include none.  Plan  Current plan is for psych/soc. Jackson [name] is under involuntary commitment.      Willy Eddy, MD 12/30/21 [phone]

## 2021-12-30 NOTE — ED Notes (Signed)
Snack and beverage given. 

## 2021-12-30 NOTE — ED Notes (Signed)
Pt refused vital signs.

## 2021-12-30 NOTE — BH Assessment (Signed)
Referral checks:    [facility] ([phone]- [phone]), No answer    [facility] [phone]), Advised to call back on 12/30/21.    [facility] [phone] -or- [phone]), No answer.    [facility] [phone]), Unable to reach intake staff.    [facility] [phone] or [phone] Not accepting outside pts at this time.    [facility] [phone]). No answer.

## 2021-12-30 NOTE — BH Assessment (Signed)
Referral checks:   [facility] [phone]), No answer/ability to leave a voicemail.   [facility] [phone]), Left a HIPPA compliant voicemail requesting a return phone call.   [facility] [phone] or [phone]) ft a HIPPA compliant voicemail requesting a return phone call.   [facility] [phone]), Darl Pikes requested a refax. Task completed at 8:19 PM.    [facility] [phone] -or- [phone]), Per Durenda Age, no appropriate beds available.   [facility] [phone]). No answer/number no longer in working order.

## 2021-12-30 NOTE — ED Notes (Signed)
IVC, to be renewed 5.30.23.

## 2021-12-30 NOTE — ED Notes (Signed)
ENVIRONMENTAL ASSESSMENT Potentially harmful objects out of patient reach: Yes.   Personal belongings secured: Yes.   Patient dressed in hospital provided attire only: Yes.   Plastic bags out of patient reach: Yes.   Patient care equipment (cords, cables, call bells, lines, and drains) shortened, removed, or accounted for: Yes.   Equipment and supplies removed from bottom of stretcher: Yes.   Potentially toxic materials out of patient reach: Yes.   Sharps container removed or out of patient reach: Yes.    Patient laying in bed with eyes closed. Unable to assess with  continue to monitor.

## 2021-12-31 DIAGNOSIS — F250 Schizoaffective disorder, bipolar type: Secondary | ICD-10-CM | POA: Diagnosis not present

## 2021-12-31 LAB — COMPREHENSIVE METABOLIC PANEL
ALT: 8 U/L (ref 0–44)
AST: 15 U/L (ref 15–41)
Albumin: 3.4 g/dL — ABNORMAL LOW (ref 3.5–5.0)
Alkaline Phosphatase: 50 U/L (ref 38–126)
Anion gap: 8 (ref 5–15)
BUN: 13 mg/dL (ref 6–20)
CO2: 29 mmol/L (ref 22–32)
Calcium: 9.1 mg/dL (ref 8.9–10.3)
Chloride: 105 mmol/L (ref 98–111)
Creatinine, Ser: 1.26 mg/dL — ABNORMAL HIGH (ref 0.61–1.24)
GFR, Estimated: >60 mL/min (ref >60)
Glucose, Bld: 92 mg/dL (ref 70–99)
Potassium: 4.3 mmol/L (ref 3.5–5.1)
Sodium: 142 mmol/L (ref 135–145)
Total Bilirubin: 0.5 mg/dL (ref 0.3–1.2)
Total Protein: 6.1 g/dL — ABNORMAL LOW (ref 6.5–8.1)

## 2021-12-31 LAB — CBC
HCT: 42.8 % (ref 39.0–52.0)
Hemoglobin: 13.5 g/dL (ref 13.0–17.0)
MCH: 30.8 pg (ref 26.0–34.0)
MCHC: 31.5 g/dL (ref 30.0–36.0)
MCV: 97.7 fL (ref 80.0–100.0)
Platelets: 247 10*3/uL (ref 150–400)
RBC: 4.38 MIL/uL (ref 4.22–5.81)
RDW: 13.1 % (ref 11.5–15.5)
WBC: 3.9 10*3/uL — ABNORMAL LOW (ref 4.0–10.5)
nRBC: 0 % (ref 0.0–0.2)

## 2021-12-31 LAB — ETHANOL: Alcohol, Ethyl (B): <10 mg/dL (ref <10)

## 2021-12-31 LAB — VALPROIC ACID LEVEL: Valproic Acid Lvl: 95 ug/mL (ref 50.0–100.0)

## 2021-12-31 LAB — SALICYLATE LEVEL: Salicylate Lvl: <7 mg/dL — ABNORMAL LOW (ref 7.0–30.0)

## 2021-12-31 LAB — ACETAMINOPHEN LEVEL: Acetaminophen (Tylenol), Serum: <10 ug/mL — ABNORMAL LOW (ref 10–30)

## 2021-12-31 NOTE — ED Notes (Signed)
Pt refused vitals 

## 2021-12-31 NOTE — BH Assessment (Signed)
Writer spoke with Psychiatric nurse ([facility]) [phone]. She reports they are in the process of seeking placement for patient since he has been evicted from current boarding house. Katie reports she is working closely with patient's mother/legal guardian and hoping to find a facility suitable to his needs.

## 2021-12-31 NOTE — ED Notes (Signed)
VOL per Celso Amy, pending TOC placement
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
## 2021-12-31 NOTE — ED Notes (Signed)
Snack and drink given 

## 2021-12-31 NOTE — ED Notes (Signed)
Per Gust Rung NP IVC papers are allowed to expire

## 2022-01-01 NOTE — ED Notes (Signed)
Pt provided snack and drink  

## 2022-01-01 NOTE — ED Notes (Signed)
Pt calm and cooperative at this time. Performing ADLs independently. Pt took am medications this morning with no problem.

## 2022-01-01 NOTE — ED Notes (Signed)
VOl pending placement 

## 2022-01-01 NOTE — BH Assessment (Signed)
Writer spoke with patient's mother and father confirming details in previous conversation from yesterday 12/31/21. Writer explained that [place] (Social work) Oncologist and will collaborate with Katie([employer]) to ensure suitable housing. Explained that patient is psych cleared and because patient was evicted from his boarding house, he will need placement for housing. Patient does not have a place to live.Parents verbalized understanding and stated they will be coming to the ED on tomm 01/02/22 to speak with social work.

## 2022-01-01 NOTE — ED Notes (Signed)
Report received from Annie, RN including SBAR. Patient alert and oriented, warm and dry, and in no acute distress. Patient denies SI, HI, AVH and pain. Patient made aware of Q15 minute rounds and Rover and Officer presence for their safety. Patient instructed to come to this nurse with needs or concerns.  

## 2022-01-01 NOTE — TOC Initial Note (Signed)
Transition of Care [facility]) - Initial/Assessment Note    Patient Details  Name: Jackson [name] MRN: [id number] Date of Birth: Jun 01, 1986  Transition of Care [facility]) CM/SW Contact:    Allayne Butcher, RN Phone Number: 01/01/2022, 3:06 PM  Clinical Narrative:                 Patient brought into the emergency room for acute psychosis, IVC'd for walking out in traffic.  Patient has now been psychiatrically cleared.  RNCM has spoken with the patient's mother, she does not feel that patient is safe to discharge and he has no where to go, he destroyed his boarding room and has been evicted.   She reports wanting to speak with the psychiatric provider about patient being cleared, she believes that inpatient psych would be beneficial for the patient.  Patient was in [place] for 2 years in the past and mother reports he was good for a while after being discharged from there.  Mother and stepdad want to meet with the ACT team person and myself tomorrow.  We are scheduled to meet at 2pm.  ACT team will be responsible for placement at this time.    Expected Discharge Plan:  (TBD) Barriers to Discharge: ED Unsafe disposition   Patient Goals and CMS Choice Patient states their goals for this hospitalization and ongoing recovery are:: Mother and stepfather would like for patient to have psych placement      Expected Discharge Plan and Services Expected Discharge Plan:  (TBD)   Discharge Planning Services: CM Consult   Living arrangements for the past 2 months: Boarding House                 DME Arranged: N/A DME Agency: NA       HH Arranged: NA HH Agency: NA        Prior Living Arrangements/Services Living arrangements for the past 2 months: [employer] Lives with:: Self Patient language and need for interpreter reviewed:: No Do you feel safe going back to the place where you live?: No   per mother patient has been evicted  Need for Family Participation in Patient Care: Yes  (Comment) Care giver support system in place?: Yes (comment)   Criminal Activity/Legal Involvement Pertinent to Current Situation/Hospitalization: No - Comment as needed  Activities of Daily Living      Permission Sought/Granted Permission sought to share information with : Case Manager, Family Supports Permission granted to share information with : Yes, Verbal Permission Granted  Share Information with NAME: Forensic psychologist  Permission granted to share info w AGENCY: ACT Team  Permission granted to share info w Relationship: mother  Permission granted to share info w Contact Information: [phone]  Emotional Assessment         Alcohol / Substance Use: Not Applicable Psych Involvement: Yes (comment), Outpatient Provider  Admission diagnosis:  ivc Patient Active Problem List   Diagnosis Date Noted   Noncompliance 10/18/2018   Schizoaffective disorder, bipolar type (HCC) 10/17/2018   PCP:  Oneita Hurt No Pharmacy:   [facility] - [place], [place] - [address] [place] [address] Phone: [phone] Fax: [phone]  [facility] - [place], [place] - [address] [place] [address] Phone: [phone] Fax: [phone]  [facility] [address] ([address]), [place] - [address] [place]) [place] [address] Phone: [phone] Fax: [phone]     Social  Determinants of Health (SDOH) Interventions    Readmission Risk Interventions     View : No data to display.

## 2022-01-01 NOTE — ED Notes (Signed)

## 2022-01-01 NOTE — ED Notes (Signed)
Pt. To BHU from ED ambulatory without difficulty, to room  BHU 1. Report from Chris RN. Pt. Is alert and oriented, warm and dry in no distress. Pt. Denies SI, HI, and AVH. Pt. Calm and cooperative. Pt. Made aware of security cameras and Q15 minute rounds. Pt. Encouraged to let Nursing staff know of any concerns or needs.  ? ?

## 2022-01-01 NOTE — ED Notes (Signed)
Pt has fallen asleep since assessment, will collect vitals once he wakes up again or medications are given

## 2022-01-01 NOTE — TOC Initial Note (Signed)
Transition of Care [facility]) - Initial/Assessment Note    Patient Details  Name: Jackson [name] MRN: [id number] Date of Birth: [birth year]  Transition [facility] Care [facility]) CM/SW Contact:    Gregg Cellar, RN Phone Number: 01/01/2022, 10:41 AM  Clinical Narrative:                 Confirmed with treatment team [facility] consult not needed at this time. Team continues to seek inpatient psych placement and working with [facility] team member on placement needs.         Patient Goals and CMS Choice        Expected Discharge Plan and Services                                                Prior Living Arrangements/Services                       Activities of Daily Living      Permission Sought/Granted                  Emotional Assessment              Admission diagnosis:  ivc Patient Active Problem List   Diagnosis Date Noted   Noncompliance 10/18/2018   Schizoaffective disorder, bipolar type (HCC) 10/17/2018   PCP:  Oneita Hurt, No Pharmacy:   [facility] - [place], [place] - [address] [place] [address] Phone: [phone] Fax: [phone]  [facility] - [place], [place] - [address] [place] [address] Phone: [phone] Fax: [phone]  [facility] [address] ([address]), [place] - [address] [place]) [place] [address] Phone: [phone] Fax: [phone]     Social Determinants of Health (SDOH) Interventions    Readmission Risk Interventions     View : No data to display.

## 2022-01-01 NOTE — ED Notes (Signed)
Meal tray given 

## 2022-01-01 NOTE — TOC Progression Note (Addendum)
Transition of Care [facility]) - Progression Note    Patient Details  Name: Jackson [name] MRN: [id number] Date of Birth: [birth year]  Transition of Care [facility]) CM/SW Contact  Nehawka Cellar, RN Phone Number: 01/01/2022, 10:56 AM  Clinical Narrative:    After much discussion decision was made by treatment team patient would not be requiring inpatient psych placement and would need to be placed in the community with assistance of ACT team. Mother-legal guardian working with ACT team for placement. TOC will assist ACT team as needed. Patient is well known to treatment team and confirmed mother is highly involved and supportive in patients care.         Expected Discharge Plan and Services                                                 Social Determinants of Health (SDOH) Interventions    Readmission Risk Interventions     View : No data to display.

## 2022-01-02 DIAGNOSIS — Z046 Encounter for general psychiatric examination, requested by authority: Secondary | ICD-10-CM | POA: Diagnosis not present

## 2022-01-02 DIAGNOSIS — Z20822 Contact with and (suspected) exposure to covid-19: Secondary | ICD-10-CM | POA: Diagnosis not present

## 2022-01-02 DIAGNOSIS — R451 Restlessness and agitation: Secondary | ICD-10-CM | POA: Diagnosis not present

## 2022-01-02 DIAGNOSIS — F29 Unspecified psychosis not due to a substance or known physiological condition: Secondary | ICD-10-CM | POA: Diagnosis not present

## 2022-01-02 DIAGNOSIS — F250 Schizoaffective disorder, bipolar type: Secondary | ICD-10-CM | POA: Diagnosis present

## 2022-01-02 NOTE — ED Notes (Signed)
BREAKFAST TRAY GIVEN. PT IS EATING BREAKFAST IN THE DAY ROOM.

## 2022-01-02 NOTE — ED Provider Notes (Signed)
Emergency Medicine Observation Re-evaluation Note  Jackson [name] is a 36 y.o. male, seen on rounds today.  Pt initially presented to the ED for complaints of Psychiatric Evaluation Currently, the patient is calm and cooperative.  Patient has no medical complaints.  Patient states he is ready to go home.  No acute events overnight.  Physical Exam  BP (!) 94/56   Pulse 81   Temp 98.9 F (37.2 C) (Oral)   Resp 16   Ht 6' (1.829 m)   Wt 113 kg   SpO2 96%   BMI 33.79 kg/m  Physical Exam General: Calm cooperative, no distress Cardiac: Regular rate and rhythm around 70 bpm Lungs: Clear to auscultation Psych: Calm  ED Course / MDM   I reviewed the lab work from 2 days ago, no significant abnormality on my evaluation.  Plan  Current plan is for placement to an appropriate group facility once available.  Jackson [name] is not under involuntary commitment.     Minna Antis, MD 01/02/22 [phone]

## 2022-01-02 NOTE — ED Notes (Signed)
pt given cup of water

## 2022-01-02 NOTE — ED Notes (Signed)
VOL/Pending Placement 

## 2022-01-02 NOTE — ED Notes (Signed)
Report to include Situation, Background, Assessment, and Recommendations received from Amy RN. Patient alert and oriented, warm and dry, in no acute distress. Patient denies SI, HI, AVH and pain. Patient appears to be having auditory hallucination. Patient made aware of Q15 minute rounds and security cameras for their safety. Patient instructed to come to me with needs or concerns.

## 2022-01-02 NOTE — ED Notes (Signed)
DR. Kerman Passey AT BEDSIDE TALKING TO PT.

## 2022-01-02 NOTE — ED Notes (Signed)
TOC, pt's mother, step father and ACt Team member meeting with patient in [place].

## 2022-01-02 NOTE — ED Notes (Signed)
Patient given dinner tray.

## 2022-01-02 NOTE — TOC Progression Note (Signed)
Transition of Care [facility]) - Progression Note    Patient Details  Name: Hani Deshong MRN: [id number] Date of Birth: 06/06/1986  Transition [facility]) CM/SW Contact  Shelbie Hutching, RN Phone Number: 01/02/2022, 5:08 PM  Clinical Narrative:    RNCM met with patient's mother, stepfather, and ACT team lead Katelyn Clowers with [facility] at the hospital in consult room in the ED.  We discussed discharge planning.  ACT team has been noticing the patient decompensating at home with erratic paranoid behavior and refusal to take his medications.  Patient's mother reports that the patient cannot go back to his apartment, he has an eviction notice and his apartment has been cleaned out by the stepfather.   Current plan of care will be to work on getting patient into a group home where he can be monitored closely and given his medication and reminded to bath regularly, and ACT team will cont to follow.  We then met with patient in the ED behavioral unit.  Patient is calm and cooperative,  he does not want to go to a group home and expresses that he wants to be discharged and to go back home so he can clean out his apartment.  He does not get angry but expresses frustration with not being about to leave at this time.    TOC will be working with [facility] to find Group home placement in this area or in [facility] where the parents live.     Expected Discharge Plan: Group Home Barriers to Discharge: ED Unsafe disposition  Expected Discharge Plan and Services Expected Discharge Plan: Group Home   Discharge Planning Services: CM Consult   Living arrangements for the past 2 months: [place]                 DME Arranged: N/A DME Agency: NA       HH Arranged: NA HH Agency: NA         Social Determinants of Health (SDOH) Interventions    Readmission Risk Interventions     View : No data to display.

## 2022-01-02 NOTE — ED Notes (Signed)
Pt was sleep, will obtain vitals when patient is awake.

## 2022-01-03 DIAGNOSIS — F250 Schizoaffective disorder, bipolar type: Secondary | ICD-10-CM | POA: Diagnosis not present

## 2022-01-03 NOTE — ED Notes (Signed)
Snack and beverage given. 

## 2022-01-03 NOTE — ED Notes (Signed)
Patient given lunch tray.

## 2022-01-03 NOTE — ED Notes (Signed)
Pt given snack and beverage. 

## 2022-01-03 NOTE — ED Notes (Signed)
Report to include Situation, Background, Assessment, and Recommendations received from Amy RN. Patient alert and oriented, warm and dry, in no acute distress. Patient denies SI, HI, AVH and pain. Patient made aware of Q15 minute rounds and security cameras for their safety. Patient instructed to come to me with needs or concerns.  

## 2022-01-03 NOTE — ED Notes (Signed)
VOL placement 

## 2022-01-03 NOTE — ED Provider Notes (Signed)
Emergency Medicine Observation Re-evaluation Note  Jackson [name] is a 36 y.o. male, seen on rounds today.  Pt initially presented to the ED for complaints of Psychiatric Evaluation   Physical Exam  BP 104/67 (BP Location: Left Arm)   Pulse 88   Temp 98.4 F (36.9 C) (Oral)   Resp 16   Ht 1.829 m (6')   Wt 113 kg   SpO2 99%   BMI 33.79 kg/m  Physical Exam No change from prior ED Course / MDM    Plan  Current plan is for group home placement.  Jackson [name] is not under involuntary commitment.     Lavonia Drafts, MD 01/03/22 1501

## 2022-01-03 NOTE — ED Notes (Signed)
Pt requesting his VS be re checked.  Pt states his "brain feels weird."   Pt states he does not need the medication he is being given.  Pt's VS all WNL.  Pt is not in any distress.

## 2022-01-04 DIAGNOSIS — F250 Schizoaffective disorder, bipolar type: Secondary | ICD-10-CM | POA: Diagnosis not present

## 2022-01-04 NOTE — ED Notes (Signed)
VOL/Pending Placement 

## 2022-01-04 NOTE — ED Notes (Signed)
Report received from [name], RN including SBAR. On initial round after report Jackson [name] is warm/dry, resting quietly in room without any s/s of distress.  Will continue to monitor throughout shift as ordered for any changes in behaviors and for continued safety.   

## 2022-01-04 NOTE — ED Provider Notes (Signed)
Emergency Medicine Observation Re-evaluation Note  Jackson [name] is a 36 y.o. male, seen on rounds today.  Pt initially presented to the ED for complaints of Psychiatric Evaluation   Physical Exam  BP 116/87 (BP Location: Right Arm)   Pulse 96   Temp 98.2 F (36.8 C) (Oral)   Resp 18   Ht 6' (1.829 m)   Wt 113 kg   SpO2 95%   BMI 33.79 kg/m  Physical Exam General: NAd Cardiac: well perfused   Lungs: unlabored ED Course / MDM  EKG:   I have reviewed the labs performed to date as well as medications administered while in observation.  Recent changes in the last 24 hours include, none .  Plan  Current plan is for psych/soc.  Jackson [name] is not under involuntary commitment.     Willy Eddy, MD 01/04/22 [phone]

## 2022-01-04 NOTE — ED Notes (Signed)
Hospital meal provided, pt tolerated w/o complaints.  Waste discarded appropriately.  

## 2022-01-04 NOTE — ED Notes (Signed)
Report to include Situation, Background, Assessment, and Recommendations received from Katie RN. Patient alert and oriented, warm and dry, in no acute distress. Patient denies SI, HI, AVH and pain. Patient made aware of Q15 minute rounds and security cameras for their safety. Patient instructed to come to me with needs or concerns.  

## 2022-01-05 DIAGNOSIS — F250 Schizoaffective disorder, bipolar type: Secondary | ICD-10-CM | POA: Diagnosis not present

## 2022-01-05 NOTE — ED Notes (Signed)
Report received from [name], RN including SBAR. On initial round after report Pt is warm/dry, resting quietly in room without any s/s of distress.  Will continue to monitor throughout shift as ordered for any changes in behaviors and for continued safety.   

## 2022-01-05 NOTE — ED Notes (Signed)
Unlocked bathroom door to allow patient to shower.  Staff continuously monitored dayroom outside of bathroom door during pt shower.  Pt was given hygiene items as well as:  I clean top, 1 clean bottom, with 1 pair of disposable underwear.  Pt changed out into clean clothing.  Staff disposed of all shower supplies. Shower room cleaned and secured for next use.  

## 2022-01-05 NOTE — ED Notes (Signed)
Pt given lunch tray and drink at this time. 

## 2022-01-05 NOTE — ED Notes (Signed)
Hospital meal provided, pt tolerated w/o complaints.  Waste discarded appropriately.  

## 2022-01-05 NOTE — ED Notes (Signed)
Report to include Situation, Background, Assessment, and Recommendations received from Katie RN. Patient alert and oriented, warm and dry, in no acute distress. Patient denies SI, HI, AVH and pain. Patient made aware of Q15 minute rounds and security cameras for their safety. Patient instructed to come to me with needs or concerns.  

## 2022-01-05 NOTE — ED Notes (Signed)
VOL/pending placement 

## 2022-01-05 NOTE — ED Notes (Addendum)
Dinner tray given

## 2022-01-05 NOTE — ED Notes (Signed)
Snack and beverage given. 

## 2022-01-06 DIAGNOSIS — F250 Schizoaffective disorder, bipolar type: Secondary | ICD-10-CM | POA: Diagnosis not present

## 2022-01-06 NOTE — ED Notes (Signed)
Pt complaining of a toothache.  EDP made aware.

## 2022-01-06 NOTE — ED Notes (Signed)
Lunch tray given. 

## 2022-01-06 NOTE — ED Provider Notes (Signed)
Emergency Medicine Observation Re-evaluation Note  Jackson [name] is a 36 y.o. male, seen on rounds today.  Pt initially presented to the ED for complaints of Psychiatric Evaluation   Physical Exam  BP (!) 108/56 (BP Location: Left Arm)   Pulse 68   Temp 97.9 F (36.6 C) (Oral)   Resp 17   Ht 6' (1.829 m)   Wt 113 kg   SpO2 98%   BMI 33.79 kg/m  Physical Exam General: NAD Cardiac: Well perfused, no cyanosis Lungs: Breathing comfortably  ED Course / MDM  EKG:   I have reviewed the labs performed to date as well as medications administered while in observation.  Recent changes in the last 24 hours include none.  Plan  Current plan is for psych/TOC.  Jackson [name] is not under involuntary commitment.     Blake Divine, MD 01/06/22 [phone]

## 2022-01-06 NOTE — ED Notes (Signed)
Patient given dinner tray.

## 2022-01-06 NOTE — ED Notes (Signed)
VOL/pending placement 

## 2022-01-06 NOTE — ED Notes (Signed)
Given snack. 

## 2022-01-06 NOTE — ED Notes (Signed)
Meal given to pt.

## 2022-01-06 NOTE — ED Notes (Signed)
Patient refused to have housekeeping to clean room 

## 2022-01-06 NOTE — ED Notes (Signed)
Patient resting quietly in room. No noted distress or abnormal behaviors noted. Will continue 15 minute checks and observation by security camera for safety. 

## 2022-01-07 DIAGNOSIS — F250 Schizoaffective disorder, bipolar type: Secondary | ICD-10-CM | POA: Diagnosis not present

## 2022-01-07 MED ORDER — DICYCLOMINE HCL 10 MG PO CAPS
10.0000 mg | ORAL_CAPSULE | Freq: Once | ORAL | Status: AC
  Administered 2022-01-07: 10 mg via ORAL
  Filled 2022-01-07 (×2): qty 1

## 2022-01-07 NOTE — ED Provider Notes (Signed)
Emergency Medicine Observation Re-evaluation Note  Jackson [name] is a 36 y.o. male, seen on rounds today.  Pt initially presented to the ED for complaints of Psychiatric Evaluation   Physical Exam  BP 114/72   Pulse 74   Temp 97.9 F (36.6 C) (Oral)   Resp 17   Ht 6' (1.829 m)   Wt 113 kg   SpO2 99%   BMI 33.79 kg/m  Physical Exam General: NAD.  Ambulating to bathroom.  Reports no concerns.  Checks by hand.  Would like to know when he might be able to get to a home Cardiac: Well perfused, no cyanosis Lungs: Breathing comfortably  ED Course / MDM  EKG:   I have reviewed the labs performed to date as well as medications administered while in observation.  Recent changes in the last 24 hours include none.  Plan  Current plan is for psych/TOC.  Jackson [name] is not under involuntary commitment.    Jackson Creamer, MD 01/07/22 [phone]

## 2022-01-07 NOTE — ED Notes (Signed)
VOL/Pending TOC Placement 

## 2022-01-07 NOTE — ED Notes (Signed)
VOL  PENDING  PLACEMENT 

## 2022-01-07 NOTE — ED Notes (Signed)
Patient ate breakfast, and then took shower, He is alert and oriented, calm and cooperative, Nurse will continue to monitor, camera surveillance in operating condition.

## 2022-01-07 NOTE — ED Notes (Signed)
Patient ate 100% of lunch and beverage.  

## 2022-01-07 NOTE — ED Notes (Signed)
Patient received lunch tray and beverage, no signs of distress, will continue to monitor.

## 2022-01-08 DIAGNOSIS — F250 Schizoaffective disorder, bipolar type: Secondary | ICD-10-CM | POA: Diagnosis not present

## 2022-01-08 NOTE — ED Notes (Signed)
Patient is taking shower. 

## 2022-01-08 NOTE — ED Notes (Signed)

## 2022-01-08 NOTE — ED Notes (Signed)
Pt given snack. 

## 2022-01-08 NOTE — ED Notes (Signed)
Breakfast tray and beverage served.

## 2022-01-08 NOTE — ED Notes (Signed)
Patient ate 100% of supper and beverage. Patient is pleasant, and cooperative, no signs of distress.

## 2022-01-08 NOTE — ED Notes (Signed)
EVS to clean room, pt refusing at this time.

## 2022-01-08 NOTE — ED Notes (Signed)
Pt given lunch tray.

## 2022-01-08 NOTE — ED Notes (Signed)
Vol placement pending most recent note on chart from 01/02/22

## 2022-01-09 DIAGNOSIS — F250 Schizoaffective disorder, bipolar type: Secondary | ICD-10-CM | POA: Diagnosis not present

## 2022-01-09 NOTE — ED Notes (Signed)
Pt denies any SI or HI. Pt denies any active auditory or visual hallucinations.

## 2022-01-09 NOTE — ED Notes (Signed)
Pt given snack. 

## 2022-01-09 NOTE — ED Notes (Signed)
VOL/pending placement 

## 2022-01-09 NOTE — ED Notes (Signed)
Called dietary for double potions

## 2022-01-09 NOTE — ED Notes (Signed)
Breakfast tray provided with beverage  

## 2022-01-09 NOTE — ED Notes (Signed)
SNACKS PROVIDED

## 2022-01-09 NOTE — ED Notes (Signed)
Dinner tray given

## 2022-01-09 NOTE — ED Provider Notes (Signed)
Emergency Medicine Observation Re-evaluation Note  Jackson [name] is a 36 y.o. male, seen on rounds today.  Pt initially presented to the ED for complaints of Psychiatric Evaluation No acute events overnight.  Physical Exam  BP 107/73 (BP Location: Right Arm)   Pulse 91   Temp 98.1 F (36.7 C) (Oral)   Resp 18   Ht 6' (1.829 m)   Wt 113 kg   SpO2 95%   BMI 33.79 kg/m    ED Course / MDM   No recent lab work for review.  Plan  Current plan is for placement to the prep living facility once available.  Jackson [name] is not under involuntary commitment.     Minna Antis, MD 01/09/22 [phone]

## 2022-01-09 NOTE — ED Notes (Signed)
Hospital meal provided, pt tolerated w/o complaints.  Waste discarded appropriately.  

## 2022-01-09 NOTE — ED Notes (Signed)
VOL/Pending Placement 

## 2022-01-09 NOTE — TOC Progression Note (Signed)
Transition of Care [facility]) - Progression Note    Patient Details  Name: Jackson [name] MRN: [id number] Date of Birth: [birth year]  Transition of Care [facility]) CM/SW Contact  Allayne Butcher, RN Phone Number: 01/09/2022, 9:43 AM  Clinical Narrative:    RNCM has been in contact with patient's mother and with [facility], we continue to look for appropriate placement.     Expected Discharge Plan: Group Home Barriers to Discharge: ED Unsafe disposition  Expected Discharge Plan and Services Expected Discharge Plan: Group Home   Discharge Planning Services: CM Consult   Living arrangements for the past 2 months: Boarding House                 DME Arranged: N/A DME Agency: NA       HH Arranged: NA HH Agency: NA         Social Determinants of Health (SDOH) Interventions    Readmission Risk Interventions     No data to display

## 2022-01-10 DIAGNOSIS — F250 Schizoaffective disorder, bipolar type: Secondary | ICD-10-CM | POA: Diagnosis not present

## 2022-01-10 MED ORDER — ACETAMINOPHEN 325 MG PO TABS
650.0000 mg | ORAL_TABLET | Freq: Once | ORAL | Status: AC
  Administered 2022-01-10: 650 mg via ORAL
  Filled 2022-01-10: qty 2

## 2022-01-10 MED ORDER — GUAIFENESIN 100 MG/5ML PO LIQD
5.0000 mL | Freq: Four times a day (QID) | ORAL | Status: DC | PRN
  Administered 2022-01-10: 5 mL via ORAL
  Filled 2022-01-10 (×2): qty 10

## 2022-01-10 NOTE — ED Notes (Signed)
Pt received snack. This tech obtained vitals. No other needs at this moment.

## 2022-01-10 NOTE — ED Notes (Signed)
Report to include Situation, Background, Assessment, and Recommendations received from Crystal RN. Patient alert and oriented, warm and dry, in no acute distress. Patient denies SI, HI, AVH and pain. Patient made aware of Q15 minute rounds and security cameras for their safety. Patient instructed to come to me with needs or concerns.  

## 2022-01-10 NOTE — ED Provider Notes (Signed)
Emergency Medicine Observation Re-evaluation Note  Caylon Ofallon is a 36 y.o. male, seen on rounds today.  Pt initially presented to the ED for complaints of Psychiatric Evaluation Currently, the patient is resting comfortably in bed and has no acute complaints.  Physical Exam  BP (!) 101/50 (BP Location: Left Arm)   Pulse 71   Temp 97.9 F (36.6 C) (Oral)   Resp 17   Ht 6' (1.829 m)   Wt 113 kg   SpO2 100%   BMI 33.79 kg/m  Physical Exam General: No distress Cardiac: Well perfused Resp: Normal effort Psych: Calm and cooperative  ED Course / MDM   I have reviewed the labs performed to date as well as medications administered while in observation.  There have been no changes to his status in the last 24 hours.  Plan  Current plan is for placement.  Blain Shvartsman is not under involuntary commitment.     Arta Silence, MD 01/10/22 [phone]

## 2022-01-10 NOTE — TOC Progression Note (Signed)
Transition of Care [facility]) - Progression Note    Patient Details  Name: Jackson [name] MRN: [id number] Date of Birth: November 23, 1985  Transition of Care [facility]) CM/SW Contact  Allayne Butcher, RN Phone Number: 01/10/2022, 5:47 PM  Clinical Narrative:    Reached out to [facility]- no openings [facility]- no openings [facility]- no openings [facility]- no answer [facility]- left message  [facility], referral emailed to Waynetta Sandy at [email or web address].  Jeannett Senior reports he will get back to me on Monday.   Expected Discharge Plan: Group Home Barriers to Discharge: ED Unsafe disposition  Expected Discharge Plan and Services Expected Discharge Plan: Group Home   Discharge Planning Services: CM Consult   Living arrangements for the past 2 months: Boarding House                 DME Arranged: N/A DME Agency: NA       HH Arranged: NA HH Agency: NA         Social Determinants of Health (SDOH) Interventions    Readmission Risk Interventions     No data to display

## 2022-01-10 NOTE — ED Notes (Signed)
Pt given supplies for shower  

## 2022-01-10 NOTE — ED Notes (Signed)
Pt given dinner tray and sweet tea.  

## 2022-01-10 NOTE — [place] FL2 (Signed)
[place] MEDICAID FL2 LEVEL OF CARE SCREENING TOOL     IDENTIFICATION  Patient Name: Jackson [name] Birthdate: 09/29/1985 Sex: male Admission Date (Current Location): 12/24/2021  [place] and [place] Number:  Chiropodist and Address:  [facility], [address] [place], [place] [address]      Provider Number: [id number]  Attending Physician Name and Address:  No att. providers found  Relative Name and Phone Number:  [name]- mother- Legal Guardian- [phone]    Current Level of Care: Other (Comment) (ED boarder) Recommended Level of Care: Family Care Home, Other (Comment) (group home) Prior Approval Number:    Date Approved/Denied:   PASRR Number:    Discharge Plan: Other (Comment) (Group home)    Current Diagnoses: Patient Active Problem List   Diagnosis Date Noted   Noncompliance 10/18/2018   Schizoaffective disorder, bipolar type (HCC) 10/17/2018    Orientation RESPIRATION BLADDER Height & Weight     Self, Time, Place  Normal Continent Weight: 113 kg Height:  6' (182.9 cm)  BEHAVIORAL SYMPTOMS/MOOD NEUROLOGICAL BOWEL NUTRITION STATUS      Continent Diet (Regular)  AMBULATORY STATUS COMMUNICATION OF NEEDS Skin   Independent Verbally Normal                       Personal Care Assistance Level of Assistance              Functional Limitations Info  Sight, Hearing, Speech Sight Info: Adequate Hearing Info: Adequate Speech Info: Adequate    SPECIAL CARE FACTORS FREQUENCY                       Contractures Contractures Info: Not present    Additional Factors Info  Code Status, Allergies, Psychotropic Code Status Info: Full Allergies Info: none Psychotropic Info: Schizoaffective disorder, bipolar type         Current Medications (01/10/2022):  This is the current hospital active medication list Current Facility-Administered Medications  Medication Dose Route Frequency Provider Last  Rate Last Admin   ARIPiprazole (ABILIFY) tablet 5 mg  5 mg Oral QHS Gabriel Cirri F, NP   5 mg at 01/09/22 2209   ARIPiprazole ER (ABILIFY MAINTENA) injection 400 mg  400 mg Intramuscular Q28 days Gabriel Cirri F, NP   400 mg at 12/25/21 1206   benztropine (COGENTIN) tablet 1 mg  1 mg Oral BID Gabriel Cirri F, NP   1 mg at 01/10/22 0918   divalproex (DEPAKOTE) DR tablet 1,000 mg  1,000 mg Oral Q12H Ward, Kristen N, DO   1,000 mg at 01/10/22 0918   LORazepam (ATIVAN) tablet 2 mg  2 mg Oral Q6H PRN Vanetta Mulders, NP   2 mg at 12/26/21 0904   OLANZapine zydis (ZYPREXA) disintegrating tablet 15 mg  15 mg Oral BID Ward, Kristen N, DO   15 mg at 01/10/22 0916   traZODone (DESYREL) tablet 25 mg  25 mg Oral QHS Ward, Kristen N, DO   25 mg at 01/09/22 2208   Current Outpatient Medications  Medication Sig Dispense Refill   ARIPiprazole ER (ABILIFY MAINTENA) 400 MG SRER injection Inject 2 mLs (400 mg total) into the muscle every 28 (twenty-eight) days. (Patient not taking: Reported on 12/24/2021) 1 each 2   divalproex (DEPAKOTE) 500 MG DR tablet Take 2 tablets (1,000 mg total) by mouth every 12 (twelve) hours. (Patient not taking: Reported on 12/24/2021) 120 tablet 2   OLANZapine zydis (ZYPREXA)  15 MG disintegrating tablet Take 1 tablet (15 mg total) by mouth 2 (two) times daily. (Patient not taking: Reported on 12/24/2021) 60 tablet 2   traZODone (DESYREL) 50 MG tablet Take 0.5 tablets (25 mg total) by mouth at bedtime. (Patient not taking: Reported on 12/24/2021) 15 tablet 2     Discharge Medications: Please see discharge summary for a list of discharge medications.  Relevant Imaging Results:  Relevant Lab Results:   Additional Information    Allayne Butcher, RN

## 2022-01-11 DIAGNOSIS — F250 Schizoaffective disorder, bipolar type: Secondary | ICD-10-CM | POA: Diagnosis not present

## 2022-01-11 MED ORDER — ACETAMINOPHEN 500 MG PO TABS
1000.0000 mg | ORAL_TABLET | Freq: Once | ORAL | Status: AC
  Administered 2022-01-11: 1000 mg via ORAL
  Filled 2022-01-11: qty 2

## 2022-01-11 NOTE — ED Notes (Signed)
VOL pending placement most recent note on chart 

## 2022-01-11 NOTE — ED Notes (Signed)
Pt given lunch tray and beverage 

## 2022-01-11 NOTE — ED Notes (Signed)
Patient was given snack and beverage.

## 2022-01-11 NOTE — ED Notes (Signed)
Pt has spent the day in bed.  Pt denies feeling sick or any other physical complaints.

## 2022-01-11 NOTE — ED Notes (Signed)
Report to include Situation, Background, Assessment, and Recommendations received from Amy RN. Patient alert and oriented, warm and dry, in no acute distress. Patient denies SI, HI, AVH and pain. Patient made aware of Q15 minute rounds and security cameras for their safety. Patient instructed to come to me with needs or concerns.  

## 2022-01-11 NOTE — ED Notes (Signed)
VOL/pending placement 

## 2022-01-11 NOTE — ED Provider Notes (Signed)
Emergency Medicine Observation Re-evaluation Note  Jackson [name] is a 36 y.o. male, seen on rounds today.  Pt initially presented to the ED for complaints of Psychiatric Evaluation  Currently, the patient is asleep in bed-discussed with BHU nurse no issues overnight  Physical Exam  Blood pressure 99/64, pulse 86, temperature 97.9 F (36.6 C), temperature source Oral, resp. rate 18, height 6' (1.829 m), weight 113 kg, SpO2 96 %.  Physical Exam General: No apparent distress CV: RRR Pulm: Normal WOB Neuro: Resting in bed.     ED Course / MDM     I have reviewed the labs performed to date as well as medications administered while in observation.  Recent changes in the last 24 hours include none  Plan   Current plan is to continue to wait for placement  Patient is not under full IVC at this time.   Concha Se, MD 01/11/22 [phone]

## 2022-01-11 NOTE — ED Notes (Signed)
Pt given dinner tray and beverage  

## 2022-01-12 DIAGNOSIS — F250 Schizoaffective disorder, bipolar type: Secondary | ICD-10-CM | POA: Diagnosis not present

## 2022-01-12 NOTE — ED Notes (Signed)
Report to include Situation, Background, Assessment, and Recommendations received from William RN. Patient alert and oriented, warm and dry, in no acute distress. Patient denies SI, HI, AVH and pain. Patient made aware of Q15 minute rounds and security cameras for their safety. Patient instructed to come to me with needs or concerns.  

## 2022-01-12 NOTE — ED Provider Notes (Signed)
Emergency Medicine Observation Re-evaluation Note  Jackson [name] is a 36 y.o. male, seen on rounds today.  Pt initially presented to the ED for complaints of Psychiatric Evaluation  Jackson [name] is finishing rest of the night.  He reports no concerns this morning.  Reports he slept well  Physical Exam   Vitals:   01/11/22 1943 01/11/22 2009  BP: 107/65   Pulse: 71   Resp: 18 18  Temp: 98.3 F (36.8 C) 98.3 F (36.8 C)  SpO2: 97%      Physical Exam General: No apparent distress CV: RRR Pulm: Normal WOB Neuro: Resting in bed.     ED Course / MDM     I have reviewed the labs performed to date as well as medications administered while in observation.  Recent changes in the last 24 hours include none  Plan   Current plan is to continue to wait for placement  Patient is not under full IVC at this time.     Sharyn Creamer, MD 01/12/22 [phone]

## 2022-01-12 NOTE — ED Notes (Signed)
Breakfast tray given Pt oob to BR

## 2022-01-12 NOTE — ED Notes (Signed)
Snack and beverage given. 

## 2022-01-13 DIAGNOSIS — F250 Schizoaffective disorder, bipolar type: Secondary | ICD-10-CM | POA: Diagnosis not present

## 2022-01-13 MED ORDER — ACETAMINOPHEN 325 MG PO TABS
650.0000 mg | ORAL_TABLET | Freq: Four times a day (QID) | ORAL | Status: DC | PRN
  Administered 2022-01-13 – 2022-04-06 (×36): 650 mg via ORAL
  Filled 2022-01-13 (×36): qty 2

## 2022-01-13 NOTE — ED Notes (Signed)
Patient is pleasant, no behavioral issues, will continue to monitor. Camera surveillance intact for safety, q 15 minute checks.

## 2022-01-13 NOTE — ED Provider Notes (Signed)
Emergency Medicine Observation Re-evaluation Note  Jackson [name] is a 36 y.o. male, seen on rounds today.  Pt initially presented to the ED for complaints of Psychiatric Evaluation Currently, the patient is sleeping comfortable.  Physical Exam  BP 125/67 (BP Location: Right Arm)   Pulse 76   Temp 98.3 F (36.8 C) (Oral)   Resp 18   Ht 6' (1.829 m)   Wt 113 kg   SpO2 100%   BMI 33.79 kg/m  Physical Exam General: no acute distress  Lungs: no increased wob Psych: calm  ED Course / MDM  EKG:   I have reviewed the labs performed to date as well as medications administered while in observation.  Recent changes in the last 24 hours include no change.  Plan  Current plan is for placement.   Jackson [name] is not under involuntary commitment.     Georga Hacking, MD 01/13/22 [phone]

## 2022-01-13 NOTE — ED Notes (Signed)
Pt given nighttime snack. 

## 2022-01-13 NOTE — ED Notes (Signed)
Patient ask for juice, walking around in dayroom, He is in good mood, smiling and being friendly, will continue to monitor for safety.

## 2022-01-13 NOTE — ED Notes (Signed)
Patient talking on the phone at this time, no signs of distress, camera surveillance and q 15 minute checks in progress to secure safety.

## 2022-01-13 NOTE — ED Notes (Addendum)
Patient in shower 

## 2022-01-13 NOTE — ED Notes (Signed)
Report received from Toniann Fail, English as a second language teacher. Patient alert and oriented, warm and dry, in no acute distress. Patient denies SI, HI, AVH and pain. Patient made aware of Q15 minute rounds and security cameras for their safety. Patient instructed to come to this nurse with needs or concerns. Requests tylenol for tooth pain, secure chat sent to Dr. Fuller Plan

## 2022-01-13 NOTE — ED Notes (Signed)
Hospital meal provided, pt tolerated w/o complaints.  Waste discarded appropriately.  

## 2022-01-13 NOTE — TOC Progression Note (Signed)
Transition of Care [facility]) - Progression Note    Patient Details  Name: Jackson [name] MRN: [id number] Date of Birth: [birth year]  Transition [facility] Care [facility]) CM/SW Contact  Allayne Butcher, RN Phone Number: 01/13/2022, 4:13 PM  Clinical Narrative:    Reached back out to Waynetta Sandy, he has reviewed referral and thinks everything looks pretty good, he does want his clinical person to review.  He should have an answer tomorrow after 2pm.   Also received update from [place] with [facility], she is still calling around to group homes herself.     Expected Discharge Plan: Group Home Barriers to Discharge: ED Unsafe disposition  Expected Discharge Plan and Services Expected Discharge Plan: Group Home   Discharge Planning Services: CM Consult   Living arrangements for the past 2 months: Boarding House                 DME Arranged: N/A DME Agency: NA       HH Arranged: NA HH Agency: NA         Social Determinants of Health (SDOH) Interventions    Readmission Risk Interventions     No data to display

## 2022-01-13 NOTE — ED Notes (Signed)
Patient is vol placement

## 2022-01-14 DIAGNOSIS — F250 Schizoaffective disorder, bipolar type: Secondary | ICD-10-CM | POA: Diagnosis not present

## 2022-01-14 NOTE — ED Notes (Signed)
Report received from [name], RN including SBAR. On initial round after report Pt is warm/dry, resting quietly in room without any s/s of distress.  Will continue to monitor throughout shift as ordered for any changes in behaviors and for continued safety.   

## 2022-01-14 NOTE — ED Notes (Signed)
VOL/Pending Placement 

## 2022-01-14 NOTE — ED Notes (Addendum)
Pt approached staff and stated can you verify with me to my parents that it is not the doctors that are keeping me here.  Pt's family (mother and stepfather) have been telling Jackson [name] that the reason he cannot leave is that the dr.s will not let him.  Staff explained to Jackson [name] that since he has a legal guardian it is up to his legal guardian to allow him to leave or to pick him up. Jackson [name] had his parents on speaker phone at this time.  Staff reiterated to parents/guardian that it is indeed the decision of the guardian to keep the patient in the hospital as he is currently psych and medically cleared.  It is the solely the decision of the guardian that he remains here at [facility].

## 2022-01-14 NOTE — ED Notes (Signed)
VOL  PENDING  PLACEMENT 

## 2022-01-14 NOTE — ED Notes (Signed)
Unlocked bathroom door to allow patient to shower.  Staff continuously monitored dayroom outside of bathroom door during pt shower.  Pt was given hygiene items as well as:  I clean top, 1 clean bottom, with 1 pair of disposable underwear.  Pt changed out into clean clothing.  Staff disposed of all shower supplies. Shower room cleaned and secured for next use.  

## 2022-01-14 NOTE — ED Provider Notes (Signed)
Emergency Medicine Observation Re-evaluation Note  Jackson [name] is a 36 y.o. male, seen on rounds today.  Pt initially presented to the ED for complaints of Psychiatric Evaluation Currently, the patient is resting comfortably.  Physical Exam  BP 110/77 (BP Location: Left Arm)   Pulse 76   Temp 98 F (36.7 C) (Oral)   Resp 18   Ht 6' (1.829 m)   Wt 113 kg   SpO2 100%   BMI 33.79 kg/m  Physical Exam General: No acute distress  Psych: calm  ED Course / MDM  EKG:   I have reviewed the labs performed to date as well as medications administered while in observation.  Recent changes in the last 24 hours include none.  Plan  Current plan is for social work dispo.  Jackson [name] is not under involuntary commitment.     Gilles Chiquito, MD 01/14/22 1214

## 2022-01-14 NOTE — ED Notes (Signed)
Pt received snack and drink 

## 2022-01-14 NOTE — ED Notes (Signed)
Hospital meal provided, pt tolerated w/o complaints.  Waste discarded appropriately.  

## 2022-01-14 NOTE — ED Notes (Signed)

## 2022-01-15 DIAGNOSIS — F250 Schizoaffective disorder, bipolar type: Secondary | ICD-10-CM | POA: Diagnosis not present

## 2022-01-15 NOTE — TOC Progression Note (Signed)
Transition of Care [facility]) - Progression Note    Patient Details  Name: Eula Stiver MRN: [id number] Date of Birth: 01/11/1986  [facility]) CM/SW Contact  Shelbie Hutching, RN Phone Number: 01/15/2022, 2:26 PM  Clinical Narrative:    RNCM met with patient at the bedside today, patient wanted to speak with his mother to talk with her about getting him an apartment.  RNCM informed patient currently I was working on group home placement.  Patient says he will not go to a group home, he will not go somewhere where they make him take his medications.   Left a message with [facility] to see if they could accept patient.  Called Katie with [facility], she will return my call after she finishes meeting with a patient- [phone].   Expected Discharge Plan: Group Home Barriers to Discharge: ED Unsafe disposition  Expected Discharge Plan and Services Expected Discharge Plan: Group Home   Discharge Planning Services: CM Consult   Living arrangements for the past 2 months: Boarding House                 DME Arranged: N/A DME Agency: NA       HH Arranged: NA HH Agency: NA         Social Determinants of Health (SDOH) Interventions    Readmission Risk Interventions     No data to display

## 2022-01-15 NOTE — ED Notes (Signed)
Pt given dinner tray and drink at this time. 

## 2022-01-15 NOTE — ED Notes (Signed)
Pt received snack. Pt has no other needs right now.  ?

## 2022-01-15 NOTE — TOC Progression Note (Signed)
Transition of Care [facility]) - Progression Note    Patient Details  Name: Jackson [name] MRN: [id number] Date of Birth: 27-Mar-1986  Transition of Care [facility]) CM/SW Contact  Allayne Butcher, RN Phone Number: 01/15/2022, 3:53 PM  Clinical Narrative:    RNCM spoke with Katie at [facility], she has been calling to group homes but no bed offers at this time.  Florentina Addison has been keeping the patient's mother updated.   I have not heard back from [facility].     Expected Discharge Plan: Group Home Barriers to Discharge: ED Unsafe disposition  Expected Discharge Plan and Services Expected Discharge Plan: Group Home   Discharge Planning Services: CM Consult   Living arrangements for the past 2 months: Boarding House                 DME Arranged: N/A DME Agency: NA       HH Arranged: NA HH Agency: NA         Social Determinants of Health (SDOH) Interventions    Readmission Risk Interventions     No data to display

## 2022-01-15 NOTE — ED Notes (Signed)
VOL/pending placement 

## 2022-01-15 NOTE — ED Notes (Signed)
Pt given snack. 

## 2022-01-15 NOTE — ED Notes (Signed)
Pt reported dental pain, PRN given

## 2022-01-15 NOTE — ED Notes (Signed)
Hospital meal provided, pt tolerated w/o complaints.  Waste discarded appropriately.  

## 2022-01-15 NOTE — ED Notes (Addendum)
Pt met with TOC-RN and requested that staff call his mother and obtain permission to discharge him to his own care, or to an apartment.  Pt was on speaker phone with mother/guardian she explained to Jackson [name] that she would not give consent for him to leave the hospital on his own and that she is working with his ACT team to obtain housing in an assisted living facility.  Jackson [name] became upset however he was able to remain calm and keep his composure.  Pt shared with staff that he feels his guardianship is unjust and he would like to become his own guardian. 

## 2022-01-15 NOTE — ED Notes (Signed)
Report received from Godfrey Pick, English as a second language teacher. On initial round after report Pt is warm/dry, resting quietly in room without any s/s of distress.  Pt requested a bowl of cereal prior to breakfast arriving, this was provided by staff.  Will continue to monitor throughout shift as ordered for any changes in behaviors and for continued safety.

## 2022-01-15 NOTE — ED Notes (Signed)
Report received from Katie, RN including SBAR. Patient alert and oriented, warm and dry, in no acute distress. Patient denies SI, HI, AVH and pain. Patient made aware of Q15 minute rounds and security cameras for their safety. Patient instructed to come to this nurse with needs or concerns.  

## 2022-01-16 DIAGNOSIS — F250 Schizoaffective disorder, bipolar type: Secondary | ICD-10-CM | POA: Diagnosis not present

## 2022-01-16 NOTE — ED Notes (Signed)
Pt in dayroom speaking with other pt's appropriately.

## 2022-01-16 NOTE — ED Provider Notes (Signed)
Emergency Medicine Observation Re-evaluation Note  Jackson [name] is a 36 y.o. male, seen in the emergency department for psychiatric complaint.  No acute events overnight.  Patient remains calm and cooperative.  Physical Exam  BP 109/71 (BP Location: Left Arm)   Pulse 65   Temp 97.8 F (36.6 C) (Oral)   Resp 17   Ht 6' (1.829 m)   Wt 113 kg   SpO2 100%   BMI 33.79 kg/m    ED Course / MDM   No new lab work for review  Plan  Current plan is for placement to an appropriate living facility once available.  Jackson [name] is not under involuntary commitment.     Minna Antis, MD 01/16/22 [phone]

## 2022-01-16 NOTE — ED Notes (Signed)
VOL  PENDING  PLACEMENT 

## 2022-01-16 NOTE — ED Notes (Signed)
Pt given snack tray and sprite.  

## 2022-01-16 NOTE — ED Notes (Signed)
Pt given snack. 

## 2022-01-16 NOTE — ED Notes (Signed)
VOL/Pending Placement 

## 2022-01-17 DIAGNOSIS — F250 Schizoaffective disorder, bipolar type: Secondary | ICD-10-CM | POA: Diagnosis not present

## 2022-01-17 NOTE — TOC Progression Note (Signed)
Transition of Care [facility]) - Progression Note    Patient Details  Name: Jackson [name] MRN: [id number] Date of Birth: 1986-02-10  Transition of Care [facility]) CM/SW Contact  Allayne Butcher, RN Phone Number: 01/17/2022, 3:33 PM  Clinical Narrative:     TOC and [facility] continue to look for placement.  Patient has stated that he did not want to go somewhere that will make him take his medications.  Patient will be difficult placement.    Expected Discharge Plan: Group Home Barriers to Discharge: ED Unsafe disposition  Expected Discharge Plan and Services Expected Discharge Plan: Group Home   Discharge Planning Services: CM Consult   Living arrangements for the past 2 months: Boarding House                 DME Arranged: N/A DME Agency: NA       HH Arranged: NA HH Agency: NA         Social Determinants of Health (SDOH) Interventions    Readmission Risk Interventions     No data to display

## 2022-01-17 NOTE — ED Notes (Signed)
VOL/pending placement 

## 2022-01-17 NOTE — ED Notes (Signed)
Snack and beverage given. 

## 2022-01-17 NOTE — ED Notes (Signed)
Report to include Situation, Background, Assessment, and Recommendations received from Amy RN. Patient alert and oriented, warm and dry, in no acute distress. Patient denies SI, HI, AVH and pain. Patient made aware of Q15 minute rounds and security cameras for their safety. Patient instructed to come to me with needs or concerns.  

## 2022-01-17 NOTE — ED Notes (Signed)
Pt given snack. 

## 2022-01-17 NOTE — ED Notes (Signed)

## 2022-01-17 NOTE — ED Notes (Signed)
Hospital meal provided, pt tolerated w/o complaints.  Waste discarded appropriately.  

## 2022-01-17 NOTE — ED Provider Notes (Signed)
Emergency Medicine Observation Re-evaluation Note  Jackson [name] is a 36 y.o. male, seen on rounds today.  Pt initially presented to the ED for complaints of Psychiatric Evaluation Currently, the patient is resting comfortably.  Physical Exam  BP 95/60 (BP Location: Left Arm)   Pulse 89   Temp 98.5 F (36.9 C) (Oral)   Resp 18   Ht 6' (1.829 m)   Wt 113 kg   SpO2 98%   BMI 33.79 kg/m  Physical Exam General: No acute distress Cardiac: Well-perfused extremities Lungs: No respiratory distress Psych: Appropriate mood and affect  ED Course / MDM  EKG:   I have reviewed the labs performed to date as well as medications administered while in observation.  Recent changes in the last 24 hours include none.  Plan  Current plan is for placement.  Jackson [name] is not under involuntary commitment.     Merwyn Katos, MD 01/17/22 [phone]

## 2022-01-18 DIAGNOSIS — F250 Schizoaffective disorder, bipolar type: Secondary | ICD-10-CM | POA: Diagnosis not present

## 2022-01-18 NOTE — ED Provider Notes (Signed)
Emergency Medicine Observation Re-evaluation Note  Travanti Lennox is a 36 y.o. male, seen on rounds today.  Pt initially presented to the ED for complaints of Psychiatric Evaluation Currently, the patient is resting comfortably.  Physical Exam  BP 110/71 (BP Location: Right Arm)   Pulse 74   Temp 98.2 F (36.8 C) (Oral)   Resp 18   Ht 6' (1.829 m)   Wt 113 kg   SpO2 98%   BMI 33.79 kg/m  Physical Exam Constitutional:      Appearance: He is not ill-appearing or toxic-appearing.  Pulmonary:     Effort: Pulmonary effort is normal.  Musculoskeletal:        General: No deformity.  Psychiatric:     Comments: No emotional distress     ED Course / MDM  EKG:   I have reviewed the labs performed to date as well as medications administered while in observation.  Recent changes in the last 24 hours include none.  Plan  Current plan is for placement.  Dontravious Conover is not under involuntary commitment.     Delton Prairie, MD 01/18/22 [phone]

## 2022-01-18 NOTE — ED Notes (Signed)
VOL/pending placement 

## 2022-01-18 NOTE — ED Notes (Signed)
Hospital meal provided, pt tolerated w/o complaints.  Waste discarded appropriately.  

## 2022-01-18 NOTE — ED Notes (Signed)
Report to include Situation, Background, Assessment, and Recommendations received from Katie RN. Patient alert and oriented, warm and dry, in no acute distress. Patient denies SI, HI, AVH and pain. Patient made aware of Q15 minute rounds and security cameras for their safety. Patient instructed to come to me with needs or concerns.  

## 2022-01-18 NOTE — ED Notes (Signed)
Report received from Jackson M, RN including SBAR. On initial round after report Pt is warm/dry, resting quietly in room without any s/s of distress.  Will continue to monitor throughout shift as ordered for any changes in behaviors and for continued safety.   

## 2022-01-18 NOTE — ED Notes (Signed)
Snack and beverage given. 

## 2022-01-19 DIAGNOSIS — F250 Schizoaffective disorder, bipolar type: Secondary | ICD-10-CM | POA: Diagnosis not present

## 2022-01-19 NOTE — ED Notes (Signed)
VOL/pending placement 

## 2022-01-19 NOTE — ED Notes (Signed)
Snack and beverage given. 

## 2022-01-19 NOTE — ED Notes (Signed)
Hospital meal provided, pt tolerated w/o complaints.  Waste discarded appropriately.  

## 2022-01-19 NOTE — ED Provider Notes (Signed)
Emergency Medicine Observation Re-evaluation Note  Ashrith Ferran is a 36 y.o. male, seen on rounds today.  Pt initially presented to the ED for complaints of Psychiatric Evaluation Currently, the patient is resting comfortably in his bed and has no acute complaints.  Physical Exam  BP 99/62 (BP Location: Left Arm)   Pulse 73   Temp 98 F (36.7 C) (Oral)   Resp 20   Ht 6' (1.829 m)   Wt 113 kg   SpO2 99%   BMI 33.79 kg/m  Physical Exam General: No distress Cardiac: Well perfused Lungs: Normal respiratory effort Psych: Calm and cooperative  ED Course / MDM  EKG:   I have reviewed the labs performed to date as well as medications administered while in observation.  There have been no significant changes to his status in the last 24 hours.  Plan  Current plan is for placement.  Erlin Ungerer is not under involuntary commitment.     Dionne Bucy, MD 01/19/22 1549

## 2022-01-19 NOTE — ED Notes (Signed)
Report to include Situation, Background, Assessment, and Recommendations received from Katie RN. Patient alert and oriented, warm and dry, in no acute distress. Patient denies SI, HI, AVH and pain. Patient made aware of Q15 minute rounds and security cameras for their safety. Patient instructed to come to me with needs or concerns.  

## 2022-01-20 DIAGNOSIS — F250 Schizoaffective disorder, bipolar type: Secondary | ICD-10-CM | POA: Diagnosis not present

## 2022-01-20 NOTE — ED Notes (Signed)
VOLUNTARY continues to await TOC dispo 

## 2022-01-20 NOTE — ED Notes (Signed)
VOL/Pending Placement 

## 2022-01-20 NOTE — ED Provider Notes (Signed)
Emergency Medicine Observation Re-evaluation Note  Jackson [name] is a 36 y.o. male, seen on rounds today.  Pt initially presented to the ED for complaints of Psychiatric Evaluation  Currently, the patient is asleep in bed. No concerns from [facility] nurse   Physical Exam  Blood pressure 107/68, pulse 76, temperature 98.2 F (36.8 C), temperature source Oral, resp. rate 18, height 6' (1.829 m), weight 113 kg, SpO2 98 %.  Physical Exam General: No apparent distress Pulm: Normal WOB Neuro: resting in bed   ED Course / MDM     I have reviewed the labs performed to date as well as medications administered while in observation.  Recent changes in the last 24 hours include none  Plan   Current plan is to continue to wait for SW placement  Patient is not under full IVC at this time.   Concha Se, MD 01/20/22 [phone]

## 2022-01-20 NOTE — ED Notes (Signed)
Sandwich and soft drink given.  

## 2022-01-20 NOTE — ED Notes (Signed)
Pt requesting shower, given shower supplies. Pt currently showering.

## 2022-01-20 NOTE — ED Notes (Signed)

## 2022-01-20 NOTE — TOC Progression Note (Signed)
Transition of Care [facility]) - Progression Note    Patient Details  Name: Jackson [name] MRN: [id number] Date of Birth: 1986/03/14  [facility] CM/SW Contact  Allayne Butcher, RN Phone Number: 01/20/2022, 4:52 PM  Clinical Narrative:     TOC and [facility] continue to work on group home placement.  No updates at this time. RNCM will follow up with patient's LME, [employer] tomorrow and try to speak with his case worker to see if they can also assist with placement efforts.   Expected Discharge Plan: Group Home Barriers to Discharge: ED Unsafe disposition  Expected Discharge Plan and Services Expected Discharge Plan: Group Home   Discharge Planning Services: CM Consult   Living arrangements for the past 2 months: Boarding House                 DME Arranged: N/A DME Agency: NA       HH Arranged: NA HH Agency: NA         Social Determinants of Health (SDOH) Interventions    Readmission Risk Interventions     No data to display

## 2022-01-21 DIAGNOSIS — F250 Schizoaffective disorder, bipolar type: Secondary | ICD-10-CM | POA: Diagnosis not present

## 2022-01-21 MED ORDER — BENZOCAINE 10 % MT GEL
Freq: Two times a day (BID) | OROMUCOSAL | Status: AC | PRN
Start: 2022-01-21 — End: 2022-01-23
  Filled 2022-01-21 (×2): qty 9

## 2022-01-21 NOTE — ED Notes (Signed)
Report received from Kimberly S , RN including SBAR. On initial round after report Pt is warm/dry, resting quietly in room without any s/s of distress.  Will continue to monitor throughout shift as ordered for any changes in behaviors and for continued safety.   

## 2022-01-21 NOTE — ED Notes (Signed)
Hospital meal provided, pt tolerated w/o complaints.  Waste discarded appropriately.  

## 2022-01-21 NOTE — ED Notes (Signed)

## 2022-01-21 NOTE — ED Notes (Signed)
VOL  PENDING  PLACEMENT 

## 2022-01-21 NOTE — ED Provider Notes (Signed)
Emergency Medicine Observation Re-evaluation Note  Jackson [name] is a 36 y.o. male, seen on rounds today.    Physical Exam  BP 113/77 (BP Location: Left Arm)   Pulse 80   Temp 98.5 F (36.9 C) (Oral)   Resp 20   Ht 6' (1.829 m)   Wt 113 kg   SpO2 99%   BMI 33.79 kg/m  Physical Exam General: Patient resting comfortably in bed Lungs: Patient not in respiratory distress Psych: Patient not combative  ED Course / MDM  EKG:     Plan  Current plan is for social work evaluation and placement.  Steel Gavitt is not under involuntary commitment.     Arnaldo Natal, MD 01/21/22 [phone]

## 2022-01-21 NOTE — ED Notes (Signed)
Pt came to staff with a small portion of tooth that had broken off of a molar on the lower R.  Pt stated it was painful to air.  ED MD notified

## 2022-01-21 NOTE — ED Notes (Signed)
MD @ Bedside.

## 2022-01-22 DIAGNOSIS — F250 Schizoaffective disorder, bipolar type: Secondary | ICD-10-CM | POA: Diagnosis not present

## 2022-01-22 NOTE — ED Notes (Signed)
Hospital meal provided, pt tolerated w/o complaints.  Waste discarded appropriately.  

## 2022-01-22 NOTE — ED Provider Notes (Signed)
Emergency Medicine Observation Re-evaluation Note  Jackson [name] is a 36 y.o. male, seen on rounds today.   Physical Exam  BP 107/72 (BP Location: Right Arm)   Pulse 77   Temp 97.8 F (36.6 C) (Oral)   Resp 18   Ht 6' (1.829 m)   Wt 113 kg   SpO2 99%   BMI 33.79 kg/m  Physical Exam General: Patient resting comfortably in bed Lungs: Patient not in respiratory distress Psych: Patient not currently combative  ED Course / MDM  EKG:     Plan  Patient is awaiting placement by social work.Jackson [name] is not under involuntary commitment.     Arnaldo Natal, MD 01/22/22 [phone]

## 2022-01-22 NOTE — ED Notes (Signed)
Vol /pending placement 

## 2022-01-22 NOTE — ED Notes (Signed)
Pt receive snack and drink

## 2022-01-22 NOTE — ED Notes (Signed)
VOL/Pending Placement 

## 2022-01-22 NOTE — ED Notes (Signed)
Report received from Kimberly S , RN including SBAR. On initial round after report Pt is warm/dry, resting quietly in room without any s/s of distress.  Will continue to monitor throughout shift as ordered for any changes in behaviors and for continued safety.   

## 2022-01-23 DIAGNOSIS — F250 Schizoaffective disorder, bipolar type: Secondary | ICD-10-CM | POA: Diagnosis not present

## 2022-01-23 NOTE — ED Notes (Signed)
Patient refused room being cleaned by EVS.  

## 2022-01-23 NOTE — ED Notes (Signed)
Patient ate 100% of supper and beverage.  

## 2022-01-23 NOTE — ED Provider Notes (Signed)
Emergency Medicine Observation Re-evaluation Note  Jackson [name] is a 36 y.o. male, seen on rounds today.   Physical Exam  BP 100/73   Pulse 78   Temp 97.9 F (36.6 C) (Oral)   Resp 16   Ht 6' (1.829 m)   Wt 113 kg   SpO2 97%   BMI 33.79 kg/m  Physical Exam General: Patient resting comfortably in bed Lungs: Patient not in respiratory distress Psych: Patient not combative  ED Course / MDM  EKG:     Plan  Current plan is for social work placement.  Jackson [name] is not under involuntary commitment.     Jackson Natal, MD 01/23/22 [phone]

## 2022-01-23 NOTE — ED Notes (Signed)
Patient ate 100% of lunch and beverage.  

## 2022-01-23 NOTE — ED Notes (Signed)
Report received from Toniann Fail, English as a second language teacher. Patient alert and oriented, warm and dry, in no acute distress. Patient denies SI, HI, AVH and pain. Patient made aware of Q15 minute rounds and security cameras for their safety. Patient instructed to come to this nurse with needs or concerns. Was on phone, had just received, allowed to finish conversation and return phone.

## 2022-01-23 NOTE — ED Notes (Signed)
Snack and beverage given. 

## 2022-01-23 NOTE — ED Notes (Signed)
Patient ate 100% of breakfast and beverage.  

## 2022-01-23 NOTE — ED Notes (Signed)
VOL/pending placement 

## 2022-01-24 DIAGNOSIS — F250 Schizoaffective disorder, bipolar type: Secondary | ICD-10-CM | POA: Diagnosis not present

## 2022-01-24 NOTE — ED Notes (Signed)
Breakfast tray and juice given.

## 2022-01-25 DIAGNOSIS — F250 Schizoaffective disorder, bipolar type: Secondary | ICD-10-CM | POA: Diagnosis not present

## 2022-01-25 NOTE — ED Notes (Signed)
Patient resting quietly in room. No noted distress or abnormal behaviors noted. Will continue 15 minute checks and observation by security camera for safety. 

## 2022-01-26 DIAGNOSIS — F250 Schizoaffective disorder, bipolar type: Secondary | ICD-10-CM | POA: Diagnosis not present

## 2022-01-26 NOTE — ED Notes (Signed)
Breakfast and drink given °

## 2022-01-26 NOTE — ED Notes (Signed)
Pt requesting shower, given supplies. Pt currently showering.

## 2022-01-27 DIAGNOSIS — F250 Schizoaffective disorder, bipolar type: Secondary | ICD-10-CM | POA: Diagnosis not present

## 2022-01-27 NOTE — ED Notes (Signed)
Pt used phone during phone hours.
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
## 2022-01-28 DIAGNOSIS — F250 Schizoaffective disorder, bipolar type: Secondary | ICD-10-CM | POA: Diagnosis not present

## 2022-01-28 NOTE — ED Notes (Signed)
Patient took all of po medications, and had SQ insulin, He is calm and cooperative, Nurse will continue to monitor for safety, and camera surveillance is in progress , Patient sitting is dayroom with Patient ins room 3 and talking, He states " I like this guy He is cool" Patient is bright and cheery today.

## 2022-01-29 DIAGNOSIS — F250 Schizoaffective disorder, bipolar type: Secondary | ICD-10-CM | POA: Diagnosis not present

## 2022-01-29 NOTE — ED Notes (Signed)
Report received from Kimberly S , RN including SBAR. On initial round after report Pt is warm/dry, resting quietly in room without any s/s of distress.  Will continue to monitor throughout shift as ordered for any changes in behaviors and for continued safety.   

## 2022-01-29 NOTE — ED Notes (Signed)
Pt given dinner tray and drink at this time. 

## 2022-01-29 NOTE — ED Notes (Signed)
Snack and beverage given. 

## 2022-01-29 NOTE — ED Notes (Signed)
VOL/Pending Placement 

## 2022-01-29 NOTE — ED Provider Notes (Signed)
Emergency Medicine Observation Re-evaluation Note  Jackson [name] is a 36 y.o. male, seen on rounds today.  Pt initially presented to the ED for complaints of Psychiatric Evaluation Currently, the patient is resting comfortably.  Sleeping in bed, rolls over the says hello and reports no concerns.  Physical Exam  BP 97/69   Pulse 83   Temp 97.6 F (36.4 C) (Oral)   Resp 16   Ht 6' (1.829 m)   Wt 113 kg   SpO2 96%   BMI 33.79 kg/m  Physical Exam Constitutional:      Appearance: He is not ill-appearing or toxic-appearing.  Pulmonary:     Effort: Pulmonary effort is normal.  Musculoskeletal:        General: No deformity.  Psychiatric:     Comments: No emotional distress   States no concerns overnight.  Just finishing up sleeping.   ED Course / MDM  EKG:   I have reviewed the labs performed to date as well as medications administered while in observation.  Recent changes in the last 24 hours include none.  Plan  Current plan is for placement.  Jackson [name] is not under involuntary commitment.    Sharyn Creamer, MD 01/29/22 [phone]

## 2022-01-29 NOTE — ED Notes (Signed)
VOl placement most recent note on chart 

## 2022-01-29 NOTE — ED Notes (Signed)
Report received from Katie, RN including SBAR. Patient alert and oriented, warm and dry, in no acute distress. Patient denies SI, HI, AVH and pain. Patient made aware of Q15 minute rounds and security cameras for their safety. Patient instructed to come to this nurse with needs or concerns.  

## 2022-01-30 DIAGNOSIS — F250 Schizoaffective disorder, bipolar type: Secondary | ICD-10-CM | POA: Diagnosis not present

## 2022-01-30 MED ORDER — ALUM & MAG HYDROXIDE-SIMETH 200-200-20 MG/5ML PO SUSP
30.0000 mL | Freq: Once | ORAL | Status: AC
  Administered 2022-01-30: 30 mL via ORAL
  Filled 2022-01-30: qty 30

## 2022-01-30 MED ORDER — LIDOCAINE VISCOUS HCL 2 % MT SOLN
15.0000 mL | Freq: Once | OROMUCOSAL | Status: AC
  Administered 2022-01-30: 15 mL via ORAL
  Filled 2022-01-30: qty 15

## 2022-01-30 NOTE — ED Notes (Signed)
VOL  PENDING  PLACEMENT 

## 2022-01-30 NOTE — ED Notes (Signed)
Assumed care of pt at this time, report from [place], [place]. Pt is in bed sleeping at this time.

## 2022-01-30 NOTE — ED Provider Notes (Signed)
-----------------------------------------   2:07 AM on 01/30/2022 -----------------------------------------   Patient concerned that he is having a heart attack because he is having indigestion.  EKG performed.  GI cocktail ordered.  ED ECG REPORT I, Devanny Palecek J, the attending physician, personally viewed and interpreted this ECG.   Date: 01/30/2022  EKG Time: 0159  Rate: 78  Rhythm: normal sinus rhythm  Axis: Normal  Intervals:none  ST&T Change: Nonspecific No significant change from 02/2021   ----------------------------------------- 5:14 AM on 01/30/2022 -----------------------------------------  GI cocktail given with relief of symptoms.   Irean Hong, MD 01/30/22 [phone]

## 2022-01-30 NOTE — ED Notes (Signed)
Pt has been off and on into the bathroom. Pt comes to door and questions about the symptoms of a heart attack. Pt states that he felt like an electrical shock left his left wrist and went to his chest. Also reports that he has had some pain in his right shoulder for 2-3 days and touches near the end of the collar bone. Pt asks what can be done, vitals were obtained at this time. During this pt questions if his medications are causing this as he is taking so many (had recently asked this nurse about stopping them as he feels he does not need them and his mother is the one making him take them). Pt has no new pain, reports indigestion that has been ongoing also. Secure chat sent to Dr. Dolores Frame, will follow up for direction.
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
## 2022-01-30 NOTE — TOC Progression Note (Signed)
Transition of Care [facility]) - Progression Note    Patient Details  Name: Jackson [name] MRN: [id number] Date of Birth: [birth year]  Transition of Care [facility]) CM/SW Contact  Allayne Butcher, RN Phone Number: 01/30/2022, 4:01 PM  Clinical Narrative:    From Dr. Toni Amend patient is appropriate for inpatient psychiatric treatment.  Referral sent out to [facility].  RNCM will call [facility] tomorrow to make sure they got the referral.     Expected Discharge Plan: Group Home Barriers to Discharge: ED Unsafe disposition  Expected Discharge Plan and Services Expected Discharge Plan: Group Home   Discharge Planning Services: CM Consult   Living arrangements for the past 2 months: Boarding House                 DME Arranged: N/A DME Agency: NA       HH Arranged: NA HH Agency: NA         Social Determinants of Health (SDOH) Interventions    Readmission Risk Interventions     No data to display

## 2022-01-30 NOTE — ED Notes (Signed)
Patient asked for a change of clothes,soap,and wash cloth to get cleaned up.

## 2022-01-30 NOTE — ED Provider Notes (Signed)
Emergency Medicine Observation Re-evaluation Note  Jackson [name] is a 36 y.o. male, seen on rounds today.   Physical Exam  BP 113/68   Pulse 76   Temp 97.6 F (36.4 C) (Oral)   Resp 17   Ht 6' (1.829 m)   Wt 113 kg   SpO2 96%   BMI 33.79 kg/m  Physical Exam General: Patient resting comfortably in bed Lungs: Patient not in respiratory distress Psych: Patient not combative  ED Course / MDM  EKG:     Plan  Current plan is for social work placement.Jackson [name] is not under involuntary commitment.     Arnaldo Natal, MD 01/30/22 [phone]

## 2022-01-30 NOTE — ED Notes (Signed)
Pt to receive a GI cocktail and EKG.

## 2022-01-31 DIAGNOSIS — F250 Schizoaffective disorder, bipolar type: Secondary | ICD-10-CM | POA: Diagnosis not present

## 2022-01-31 MED ORDER — LORAZEPAM 0.5 MG PO TABS
0.5000 mg | ORAL_TABLET | Freq: Every day | ORAL | Status: DC
  Administered 2022-01-31 – 2022-02-03 (×3): 0.5 mg via ORAL
  Filled 2022-01-31 (×3): qty 1

## 2022-01-31 NOTE — ED Notes (Signed)
Report received from Katie, RN including SBAR. Patient alert and oriented, warm and dry, in no acute distress. Patient denies SI, HI, AVH and pain. Patient made aware of Q15 minute rounds and security cameras for their safety. Patient instructed to come to this nurse with needs or concerns.  

## 2022-01-31 NOTE — ED Notes (Signed)
VOL/Pending Placement 
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
## 2022-01-31 NOTE — ED Provider Notes (Signed)
Emergency Medicine Observation Re-evaluation Note  Jackson [name] is a 36 y.o. male, seen on rounds today.  Pt initially presented to the ED for complaints of Psychiatric Evaluation Currently, the patient is resting comfortably.  Physical Exam  BP 117/77 (BP Location: Right Arm)   Pulse 74   Temp 97.7 F (36.5 C) (Oral)   Resp 17   Ht 6' (1.829 m)   Wt 113 kg   SpO2 99%   BMI 33.79 kg/m  Physical Exam General: No acute distress Cardiac: Well-perfused extremities Lungs: No respiratory distress Psych: Appropriate mood and affect  ED Course / MDM  EKG:EKG Interpretation  Date/Time:  Thursday January 30 2022 01:59:27 EDT Ventricular Rate:  78 PR Interval:  190 QRS Duration: 78 QT Interval:  336 QTC Calculation: 383 R Axis:   63 Text Interpretation: Normal sinus rhythm Abnormal QRS-T angle, consider primary T wave abnormality Abnormal ECG When compared with ECG of 13-Feb-2021 16:33, No significant change was found Confirmed by UNCONFIRMED, DOCTOR ([id number]), editor Kearney Hard ([id number]) on 01/30/2022 1:21:03 PM  I have reviewed the labs performed to date as well as medications administered while in observation.  Recent changes in the last 24 hours include none.  Plan  Current plan is for placement.  Jackson [name] is not under involuntary commitment.     Merwyn Katos, MD 01/31/22 [phone]

## 2022-01-31 NOTE — TOC Progression Note (Signed)
Transition of Care [facility]) - Progression Note    Patient Details  Name: Jackson [name] MRN: [id number] Date of Birth: January 05, 1986  Transition of Care [facility]) CM/SW Contact  Allayne Butcher, RN Phone Number: 01/31/2022, 10:58 AM  Clinical Narrative:    [facility] fax number [phone].     Expected Discharge Plan: Group Home Barriers to Discharge: ED Unsafe disposition  Expected Discharge Plan and Services Expected Discharge Plan: Group Home   Discharge Planning Services: CM Consult   Living arrangements for the past 2 months: Boarding House                 DME Arranged: N/A DME Agency: NA       HH Arranged: NA HH Agency: NA         Social Determinants of Health (SDOH) Interventions    Readmission Risk Interventions     No data to display

## 2022-01-31 NOTE — ED Notes (Signed)
Report received from [name], RN including SBAR. On initial round after report Pt is warm/dry, resting quietly in room without any s/s of distress.  Will continue to monitor throughout shift as ordered for any changes in behaviors and for continued safety.   

## 2022-01-31 NOTE — ED Notes (Signed)
Hospital meal provided, pt tolerated w/o complaints.  Waste discarded appropriately.  

## 2022-01-31 NOTE — TOC Progression Note (Signed)
Transition of Care [facility]) - Progression Note    Patient Details  Name: Jackson [name] MRN: [id number] Date of Birth: 28-Jul-1986  Transition of Care [facility]) CM/SW Contact  Allayne Butcher, RN Phone Number: 01/31/2022, 5:07 PM  Clinical Narrative:    [facility] has received requested clinicals and they are reviewing.  They will call if they need additional information.  Unable to get fax to go through to [facility] so referral will be mailed.     Expected Discharge Plan: Group Home Barriers to Discharge: ED Unsafe disposition  Expected Discharge Plan and Services Expected Discharge Plan: Group Home   Discharge Planning Services: CM Consult   Living arrangements for the past 2 months: Boarding House                 DME Arranged: N/A DME Agency: NA       HH Arranged: NA HH Agency: NA         Social Determinants of Health (SDOH) Interventions    Readmission Risk Interventions     No data to display

## 2022-01-31 NOTE — ED Notes (Signed)
VOL/pending placement 

## 2022-01-31 NOTE — TOC Progression Note (Signed)
Transition of Care [facility]) - Progression Note    Patient Details  Name: Esmeralda Selover MRN: [id number] Date of Birth: 11-07-1985  Transition of Care [facility]) CM/SW Contact  Allayne Butcher, RN Phone Number: 01/31/2022, 10:46 AM  Clinical Narrative:    Spoke with [facility], they have received initial referral.  RNCM faxed additional requested clinicals and referral form.  Patient's Step father updated on plan of care and Katie with [employer] sent over the patient's most recent CCA to be added to referral.   RNCM also reached out to [facility], since patient's legal guardians are in [place] that is technically is county of residence- they would look at the referral.  Referral faxed to [place] with referral form and additional clinicals.    Expected Discharge Plan: Group Home Barriers to Discharge: ED Unsafe disposition  Expected Discharge Plan and Services Expected Discharge Plan: Group Home   Discharge Planning Services: CM Consult   Living arrangements for the past 2 months: Boarding House                 DME Arranged: N/A DME Agency: NA       HH Arranged: NA HH Agency: NA         Social Determinants of Health (SDOH) Interventions    Readmission Risk Interventions     No data to display

## 2022-02-01 DIAGNOSIS — Z20822 Contact with and (suspected) exposure to covid-19: Secondary | ICD-10-CM | POA: Diagnosis not present

## 2022-02-01 DIAGNOSIS — F250 Schizoaffective disorder, bipolar type: Secondary | ICD-10-CM | POA: Diagnosis not present

## 2022-02-01 DIAGNOSIS — F29 Unspecified psychosis not due to a substance or known physiological condition: Secondary | ICD-10-CM | POA: Diagnosis not present

## 2022-02-01 DIAGNOSIS — R451 Restlessness and agitation: Secondary | ICD-10-CM | POA: Diagnosis not present

## 2022-02-01 DIAGNOSIS — Z046 Encounter for general psychiatric examination, requested by authority: Secondary | ICD-10-CM | POA: Diagnosis not present

## 2022-02-01 NOTE — ED Notes (Signed)
Pt given lunch tray and drink at this time. 

## 2022-02-01 NOTE — ED Notes (Signed)
Pt in dayroom, given snack at this time.  Polite and cooperative. Trash given to this RN to dispose of.

## 2022-02-01 NOTE — ED Notes (Signed)
Report received from [name], RN including SBAR. On initial round after report Pt is warm/dry, resting quietly in room without any s/s of distress.  Will continue to monitor throughout shift as ordered for any changes in behaviors and for continued safety.   

## 2022-02-01 NOTE — ED Notes (Signed)
VOL/pending placement 

## 2022-02-01 NOTE — ED Provider Notes (Signed)
Emergency Medicine Observation Re-evaluation Note  Jackson [name] is a 36 y.o. male, seen on rounds today.  Pt initially presented to the ED for complaints of Psychiatric Evaluation Currently, the patient is resting comfortably.  Physical Exam  BP (!) 95/45 (BP Location: Left Arm)   Pulse 67   Temp 98.7 F (37.1 C) (Oral)   Resp 16   Ht 6' (1.829 m)   Wt 113 kg   SpO2 96%   BMI 33.79 kg/m  Physical Exam General: No acute distress Cardiac: Well-perfused extremities Lungs: No respiratory distress Psych: Appropriate mood and affect  ED Course / MDM  EKG:EKG Interpretation  Date/Time:  Thursday January 30 2022 01:59:27 EDT Ventricular Rate:  78 PR Interval:  190 QRS Duration: 78 QT Interval:  336 QTC Calculation: 383 R Axis:   63 Text Interpretation: Normal sinus rhythm Abnormal QRS-T angle, consider primary T wave abnormality Abnormal ECG When compared with ECG of 13-Feb-2021 16:33, No significant change was found Confirmed by UNCONFIRMED, DOCTOR ([id number]), editor Kearney Hard ([id number]) on 01/30/2022 1:21:03 PM  I have reviewed the labs performed to date as well as medications administered while in observation.  Recent changes in the last 24 hours include none.  Plan  Current plan is for placement.  Jackson [name] is not under involuntary commitment.     Merwyn Katos, MD 02/01/22 1145

## 2022-02-02 DIAGNOSIS — F250 Schizoaffective disorder, bipolar type: Secondary | ICD-10-CM | POA: Diagnosis not present

## 2022-02-02 NOTE — ED Notes (Signed)
Pt given lunch tray and drink at this time. 

## 2022-02-02 NOTE — ED Notes (Signed)
Pt in restroom 

## 2022-02-02 NOTE — ED Notes (Signed)
Pt approached staff after phone call with mother and stated that d/t his conversation his anxiety was high.  PRN given, staff offered therapeutic listening allowing time for pt to vent his frustrations

## 2022-02-02 NOTE — ED Notes (Signed)
Patient given breakfast tray and juice.

## 2022-02-02 NOTE — ED Notes (Signed)
During nursing assessment was A/Ox 4 .  .Jackson [name]  stated that he is not currently have thoughts or feelings of SI/HI.  Jackson [name] reported he is not currently having auditory or visual hallucinations.  Pt affect is bright, eye contact is intermittent , speech is of normal rate and volume  with appropriate verbiage noted.  Staff addressed any feelings or concerns that have been brought up.  Pt socializes with peers throughout the day.  Medications were administered as ordered. Continue to monitor patient as ordered for any changes in behaviors and for continued safety.

## 2022-02-02 NOTE — ED Notes (Signed)
Report received from Heather L, RN including SBAR. On initial round after report Pt is warm/dry, resting quietly in room without any s/s of distress.  Will continue to monitor throughout shift as ordered for any changes in behaviors and for continued safety.   

## 2022-02-02 NOTE — ED Provider Notes (Signed)
Emergency Medicine Observation Re-evaluation Note  Jackson [name] is a 36 y.o. male, seen on rounds today.   Physical Exam  BP 104/78 (BP Location: Left Arm)   Pulse 61   Temp 97.9 F (36.6 C) (Oral)   Resp 20   Ht 6' (1.829 m)   Wt 113 kg   SpO2 99%   BMI 33.79 kg/m  Physical Exam General: Patient resting comfortably in bed Lungs: Patient not in respiratory distress Patient not combative  ED Course / MDM  EKG:EKG Interpretation  Date/Time:  Thursday January 30 2022 01:59:27 EDT Ventricular Rate:  78 PR Interval:  190 QRS Duration: 78 QT Interval:  336 QTC Calculation: 383 R Axis:   63 Text Interpretation: Normal sinus rhythm Abnormal QRS-T angle, consider primary T wave abnormality Abnormal ECG When compared with ECG of 13-Feb-2021 16:33, No significant change was found Confirmed by UNCONFIRMED, DOCTOR ([id number]), editor Kearney Hard ([id number]) on 01/30/2022 1:21:03 PM    Plan  Current plan is for social work placement.  Jackson [name] is not under involuntary commitment.     Arnaldo Natal, MD 02/02/22 [phone]

## 2022-02-02 NOTE — ED Notes (Signed)
This writer placed dinner tray and drink at bedside; pt currently sleeping.

## 2022-02-02 NOTE — TOC Progression Note (Signed)
Transition of Care [facility]) - Progression Note    Patient Details  Name: Jackson [name] MRN: [id number] Date of Birth: 1985/11/16  Transition of Care [facility]) CM/SW Contact  Allayne Butcher, RN Phone Number: 02/02/2022, 9:15 AM  Clinical Narrative:    Patient is still under review for [facility] referral mailed on Friday due to unable to get the fax to go through.     Expected Discharge Plan: Group Home Barriers to Discharge: ED Unsafe disposition  Expected Discharge Plan and Services Expected Discharge Plan: Group Home   Discharge Planning Services: CM Consult   Living arrangements for the past 2 months: Boarding House                 DME Arranged: N/A DME Agency: NA       HH Arranged: NA HH Agency: NA         Social Determinants of Health (SDOH) Interventions    Readmission Risk Interventions     No data to display

## 2022-02-03 DIAGNOSIS — F250 Schizoaffective disorder, bipolar type: Secondary | ICD-10-CM | POA: Diagnosis not present

## 2022-02-03 NOTE — ED Notes (Signed)
Report to wendy, rn

## 2022-02-03 NOTE — ED Notes (Signed)
Patient without signs of distress, Patient ate 100% of lunch and had beverage.

## 2022-02-03 NOTE — ED Notes (Signed)
Patient ate 100% of supper and beverage, Patient is polite and calm, will continue to monitor.

## 2022-02-03 NOTE — ED Notes (Signed)
VOL placement most recent note on chart 

## 2022-02-03 NOTE — ED Notes (Signed)
Patient is calm and cooperative and consumed 100% of breakfast and beverage, will continue to monitor, camera surveillance in progress for safety.

## 2022-02-03 NOTE — ED Notes (Signed)
Sandwich and soft drink given.  

## 2022-02-04 DIAGNOSIS — F250 Schizoaffective disorder, bipolar type: Secondary | ICD-10-CM | POA: Diagnosis not present

## 2022-02-04 NOTE — ED Notes (Signed)
Pt given PRN medication for tooth pain.

## 2022-02-04 NOTE — ED Notes (Signed)
Breakfast tray given. °

## 2022-02-04 NOTE — ED Notes (Signed)
Vol pending placement most recent note on chart 

## 2022-02-04 NOTE — ED Notes (Signed)
Snack and drink given 

## 2022-02-04 NOTE — ED Notes (Signed)
Hospital meal provided, pt tolerated w/o complaints.  Waste discarded appropriately.  

## 2022-02-04 NOTE — ED Notes (Signed)
VOL/Pending Placement 

## 2022-02-04 NOTE — ED Notes (Signed)
Lunch tray and drink given.  

## 2022-02-04 NOTE — ED Notes (Signed)

## 2022-02-05 DIAGNOSIS — F250 Schizoaffective disorder, bipolar type: Secondary | ICD-10-CM | POA: Diagnosis not present

## 2022-02-05 MED ORDER — DOCUSATE SODIUM 100 MG PO CAPS: 100.0000 mg | ORAL_CAPSULE | Freq: Once | ORAL | Status: DC

## 2022-02-05 MED ORDER — DOCUSATE SODIUM 100 MG PO CAPS
100.0000 mg | ORAL_CAPSULE | Freq: Every day | ORAL | Status: DC
  Administered 2022-02-05 – 2022-02-22 (×6): 100 mg via ORAL
  Filled 2022-02-05 (×14): qty 1

## 2022-02-05 NOTE — ED Notes (Signed)
Pt given snack. 

## 2022-02-05 NOTE — ED Notes (Signed)
VOL  PENDING  PLACEMENT 

## 2022-02-05 NOTE — ED Notes (Signed)
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
## 2022-02-05 NOTE — ED Notes (Signed)
Report received from Kimberly S , RN including SBAR. On initial round after report Pt is warm/dry, resting quietly in room without any s/s of distress.  Will continue to monitor throughout shift as ordered for any changes in behaviors and for continued safety.   

## 2022-02-05 NOTE — ED Notes (Addendum)
CSW reached out to [facility] to follow up on referral that was under review. [facility] states that pt has been denied at this time. TOC to follow.   CSW spoke to admissions at [facility] [phone] who states that at this time they do not have the referral. They asked that TOC follow up tomorrow.

## 2022-02-05 NOTE — Progress Notes (Signed)
   02/05/22 1900  Clinical Encounter Type  Visited With Patient;Other (Comment) (other pts together)   Chaplain Jackson [name] engaged [name] along with other pts in common area. This was a good time of conversation and shared social support. Pt was very animated but humorous and pleasant. The visit seemed to be appreciated.

## 2022-02-05 NOTE — ED Provider Notes (Signed)
Emergency Medicine Observation Re-evaluation Note  Jackson [name] is a 36 y.o. male, seen on rounds today.  Physical Exam  BP 109/69   Pulse 74   Temp 97.7 F (36.5 C) (Oral)   Resp 17   Ht 6' (1.829 m)   Wt 113 kg   SpO2 100%   BMI 33.79 kg/m  Physical Exam General: Patient sleeping in bed Lungs: Patient not in respiratory distress Psych: Patient not combative  ED Course / MDM  EKG:  Plan  Current plan is for social work placement.  Destine Dugue is not under involuntary commitment.     Arnaldo Natal, MD 02/05/22 [phone]

## 2022-02-06 DIAGNOSIS — F250 Schizoaffective disorder, bipolar type: Secondary | ICD-10-CM | POA: Diagnosis not present

## 2022-02-06 NOTE — ED Provider Notes (Signed)
Emergency Medicine Observation Re-evaluation Note  Jackson [name] is a 36 y.o. male, seen on rounds today.    Physical Exam  BP (!) 105/93 (BP Location: Left Arm)   Pulse 60   Temp 98.3 F (36.8 C) (Oral)   Resp 16   Ht 6' (1.829 m)   Wt 113 kg   SpO2 100%   BMI 33.79 kg/m  Physical Exam General: Patient resting comfortably in bed Lungs: Patient not in respiratory distress Psych: Patient not combative  ED Course / MDM  EKG:  Plan  Current plan is for patient for social work placement.  Jackson [name] is not under involuntary commitment.     Arnaldo Natal, MD 02/06/22 [phone]

## 2022-02-06 NOTE — ED Notes (Signed)
Hospital meal provided.  100% consumed, pt tolerated w/o complaints.  Waste discarded appropriately.   

## 2022-02-06 NOTE — ED Notes (Signed)

## 2022-02-06 NOTE — ED Notes (Signed)
VOL/pending placement 

## 2022-02-06 NOTE — ED Notes (Signed)
Unlocked bathroom door to allow patient to shower.  Pt was given hygiene items as well as:  I clean top, 1 clean bottom, with 1 pair of disposable underwear.  Pt changed out into clean clothing.  Staff disposed of all shower supplies.  ?

## 2022-02-06 NOTE — ED Notes (Signed)
VOL/Pending Placement 

## 2022-02-06 NOTE — ED Notes (Signed)
Patient provided snack at appropriate snack time.  Pt consumed 100% of snack provided, tolerated well w/o complaints   Trash disposted of appropriately by patient.  

## 2022-02-06 NOTE — ED Notes (Signed)
Declined shower. 

## 2022-02-07 DIAGNOSIS — F250 Schizoaffective disorder, bipolar type: Secondary | ICD-10-CM | POA: Diagnosis not present

## 2022-02-07 NOTE — ED Notes (Signed)
Hospital meal provided.  100% consumed, pt tolerated w/o complaints.  Waste discarded appropriately.   

## 2022-02-07 NOTE — ED Provider Notes (Signed)
Emergency Medicine Observation Re-evaluation Note  Jackson [name] is a 36 y.o. male, in the emergency department initially seen for psychiatric concern.  Now awaiting placement to an appropriate living facility. No acute events since last note  Physical Exam  BP 105/73 (BP Location: Right Arm)   Pulse 66   Temp 97.8 F (36.6 C) (Oral)   Resp 18   Ht 6' (1.829 m)   Wt 113 kg   SpO2 99%   BMI 33.79 kg/m    ED Course / MDM   No recent lab work for review.  Plan  Current plan is for placement to an appropriate living facility once available.  Sharbel Caterino is not under involuntary commitment.     Minna Antis, MD 02/07/22 1253

## 2022-02-07 NOTE — ED Notes (Signed)
Breakfast given, shower offered. Pt declined shower.

## 2022-02-07 NOTE — ED Notes (Signed)
Used phone during phone hours. 

## 2022-02-07 NOTE — ED Notes (Signed)
Snack and beverage given. 

## 2022-02-07 NOTE — ED Notes (Signed)
Report to include Situation, Background, Assessment, and Recommendations received from Allyson RN. Patient alert and oriented, warm and dry, in no acute distress. Patient denies SI, HI, AVH and pain. Patient made aware of Q15 minute rounds and security cameras for their safety. Patient instructed to come to me with needs or concerns. ? ?

## 2022-02-07 NOTE — ED Notes (Signed)
VOL/pending placement 

## 2022-02-08 DIAGNOSIS — F250 Schizoaffective disorder, bipolar type: Secondary | ICD-10-CM | POA: Diagnosis not present

## 2022-02-08 NOTE — ED Notes (Signed)
Report to include Situation, Background, Assessment, and Recommendations received from Katie RN. Patient alert and oriented, warm and dry, in no acute distress. Patient denies SI, HI, AVH and pain. Patient made aware of Q15 minute rounds and security cameras for their safety. Patient instructed to come to me with needs or concerns.  

## 2022-02-08 NOTE — Progress Notes (Signed)
   02/08/22 0725  Clinical Encounter Type  Visited With Patient  Visit Type Follow-up;Spiritual support;Social support   Chaplain Pura Picinich rounded on unit following morning report. Jackson [name] was awake and we visited w3hile he had breakfast. Jackson [name] provided active listening as he shared more about his life and experiences that have led him to persistent institutional stays. 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  Jackson [name] shared more about his early life. It was a good visit of building relationship and Jackson [name] appreciated the visit and we made a plan to f/u on Monday the 10th.

## 2022-02-08 NOTE — ED Notes (Signed)
VOL/pending placement 

## 2022-02-08 NOTE — ED Notes (Signed)
Snack and beverage given. 

## 2022-02-08 NOTE — ED Notes (Signed)
Report received from [name], RN including SBAR. On initial round after report Pt is warm/dry, resting quietly in room without any s/s of distress.  Will continue to monitor throughout shift as ordered for any changes in behaviors and for continued safety.   

## 2022-02-08 NOTE — ED Provider Notes (Signed)
Emergency Medicine Observation Re-evaluation Note  Jackson [name] is a 36 y.o. male, seen on rounds today.  Pt initially presented to the ED for complaints of Psychiatric Evaluation Currently, the patient is resting comfortably in his room.  Physical Exam  BP 109/74 (BP Location: Left Arm)   Pulse 66   Temp 98 F (36.7 C) (Oral)   Resp 16   Ht 6' (1.829 m)   Wt 113 kg   SpO2 100%   BMI 33.79 kg/m  Physical Exam General: No distress Cardiac: Well perfused Lungs: Normal respiratory effort Psych: Calm and cooperative.  ED Course / MDM   There have been no significant changes to his status in the last 24 hours.  Plan  Current plan is for social work placement.Landry Mellow Canan is not under involuntary commitment.     Dionne Bucy, MD 02/08/22 [phone]

## 2022-02-09 DIAGNOSIS — F250 Schizoaffective disorder, bipolar type: Secondary | ICD-10-CM | POA: Diagnosis not present

## 2022-02-09 NOTE — ED Notes (Signed)
Patient provided snack at appropriate snack time.  Pt consumed 100% of snack provided, tolerated well w/o complaints   Trash disposted of appropriately by patient.  

## 2022-02-09 NOTE — ED Notes (Signed)
VOL/Pending Placement 

## 2022-02-09 NOTE — ED Notes (Signed)
Hospital meal provided, pt tolerated w/o complaints.  Waste discarded appropriately.  

## 2022-02-09 NOTE — ED Notes (Signed)
Report to include Situation, Background, Assessment, and Recommendations received from katie RN. Patient alert and oriented, warm and dry, in no acute distress. Patient denies SI, HI, AVH and pain. Patient made aware of Q15 minute rounds and security cameras for their safety. Patient instructed to come to me with needs or concerns.  

## 2022-02-09 NOTE — ED Notes (Signed)
Report received from [name], RN including SBAR. On initial round after report Jackson [name] is warm/dry, resting quietly in room without any s/s of distress.  Will continue to monitor throughout shift as ordered for any changes in behaviors and for continued safety.   

## 2022-02-09 NOTE — ED Notes (Signed)
Snack and beverage given. 

## 2022-02-10 DIAGNOSIS — F250 Schizoaffective disorder, bipolar type: Secondary | ICD-10-CM | POA: Diagnosis not present

## 2022-02-10 NOTE — ED Notes (Signed)
Vol patient pending placement

## 2022-02-10 NOTE — ED Notes (Signed)
Patient up and went to bathroom, no behavioral issues noted, will continue to monitor.

## 2022-02-10 NOTE — TOC Progression Note (Signed)
Transition of Care [facility]) - Progression Note    Patient Details  Name: Jackson [name] MRN: [id number] Date of Birth: May 08, 1986  Transition [facility]) CM/SW Contact  Allayne Butcher, RN Phone Number: 02/10/2022, 9:17 AM  Clinical Narrative:    [facility] Ros[facility], they have not received the mailed referral yet but since last week there was a holiday they recommended giving it another couple of days.   RNCM will cont to follow up and ensure that Moises Blood gets the referral.    Expected Discharge Plan: Group Home Barriers to Discharge: ED Unsafe disposition  Expected Discharge Plan and Services Expected Discharge Plan: Group Home   Discharge Planning Services: CM Consult   Living arrangements for the past 2 months: Boarding House                 DME Arranged: N/A DME Agency: NA       HH Arranged: NA HH Agency: NA         Social Determinants of Health (SDOH) Interventions    Readmission Risk Interventions     No data to display

## 2022-02-10 NOTE — ED Provider Notes (Signed)
Emergency Medicine Observation Re-evaluation Note  Jackson [name] is a 36 y.o. male, seen on rounds today.  Pt initially presented to the ED for complaints of Psychiatric Evaluation Currently, the patient is resting, no acute complaints.  Physical Exam  BP (!) 108/3 (BP Location: Left Arm)   Pulse 72   Temp 98.1 F (36.7 C) (Oral)   Resp 18   Ht 6' (1.829 m)   Wt 113 kg   SpO2 99%   BMI 33.79 kg/m  Physical Exam General: no acute distress Psych: calm  ED Course / MDM  EKG:EKG Interpretation  Date/Time:  Thursday January 30 2022 01:59:27 EDT Ventricular Rate:  78 PR Interval:  190 QRS Duration: 78 QT Interval:  336 QTC Calculation: 383 R Axis:   63 Text Interpretation: Normal sinus rhythm Abnormal QRS-T angle, consider primary T wave abnormality Abnormal ECG When compared with ECG of 13-Feb-2021 16:33, No significant change was found Confirmed by UNCONFIRMED, DOCTOR ([id number]), editor Kearney Hard ([id number]) on 01/30/2022 1:21:03 PM  I have reviewed the labs performed to date as well as medications administered while in observation.  Recent changes in the last 24 hours include none.  Plan  Current plan is for social work dispo.  Jackson [name] is not under involuntary commitment.     Gilles Chiquito, MD 02/10/22 [phone]

## 2022-02-10 NOTE — ED Notes (Signed)
Patient denies Si/hi or avh, Patient is safe, will continue to monitor.

## 2022-02-10 NOTE — ED Notes (Signed)
Patient got dinner tray and a drink. 

## 2022-02-10 NOTE — ED Notes (Signed)
Pt given snack. 

## 2022-02-10 NOTE — ED Notes (Signed)
Patient is pleasant, talkative, no signs of distress, will continue to monitor. Patient with good appetite, camera surveillance in progress for safety.

## 2022-02-11 DIAGNOSIS — F250 Schizoaffective disorder, bipolar type: Secondary | ICD-10-CM | POA: Diagnosis not present

## 2022-02-11 NOTE — ED Notes (Signed)
Pt was woken by peer, pt verbalized concern and voiced frustration to staff.  Pt was able to use self coping skills and returned to sleep.

## 2022-02-11 NOTE — ED Notes (Signed)
Pt becoming loud and hyper-verbal with peers and staff.  PRN for anxiety given, cont to monitor

## 2022-02-11 NOTE — ED Notes (Signed)
VOL  PENDING  PLACEMENT 

## 2022-02-11 NOTE — ED Notes (Signed)
Hospital meal provided, pt tolerated w/o complaints.  Waste discarded appropriately.  

## 2022-02-11 NOTE — ED Notes (Signed)
Snack and drink given 

## 2022-02-11 NOTE — ED Notes (Signed)
Patient provided snack at appropriate snack time.  Pt consumed 100% of snack provided, tolerated well w/o complaints   Trash disposted of appropriately by patient.  

## 2022-02-11 NOTE — ED Notes (Signed)
Report received from Kimberly S , RN including SBAR. On initial round after report Pt is warm/dry, resting quietly in room without any s/s of distress.  Will continue to monitor throughout shift as ordered for any changes in behaviors and for continued safety.   

## 2022-02-12 DIAGNOSIS — F250 Schizoaffective disorder, bipolar type: Secondary | ICD-10-CM | POA: Diagnosis not present

## 2022-02-12 NOTE — ED Notes (Signed)
VOL placement see note on chart 

## 2022-02-12 NOTE — ED Notes (Signed)
Pt using restroom

## 2022-02-12 NOTE — ED Notes (Signed)
Report received from Katie, RN including SBAR. Patient alert and oriented, warm and dry, in no acute distress. Patient denies SI, HI, AVH and pain. Patient made aware of Q15 minute rounds and security cameras for their safety. Patient instructed to come to this nurse with needs or concerns.  

## 2022-02-12 NOTE — ED Notes (Signed)
Snack and drink given 

## 2022-02-12 NOTE — ED Provider Notes (Signed)
Emergency Medicine Observation Re-evaluation Note  Jackson [name] is a 36 y.o. male, seen on rounds today.  Pt initially presented to the ED under IVC for psych evaluation. Currently, the patient is awaiting dispo to group home.  Physical Exam  BP (!) 86/50 (BP Location: Left Arm)   Pulse 69   Temp 97.8 F (36.6 C) (Oral)   Resp 17   Ht 6' (1.829 m)   Wt 113 kg   SpO2 97%   BMI 33.79 kg/m    ED Course / MDM   I have reviewed the labs performed to date as well as medications administered while in observation.  Recent changes in the last 24 hours include none.  Plan  Current plan is for group home placement.  Jackson [name] is not under involuntary commitment.     Jackson Semen, MD 02/12/22 1335

## 2022-02-12 NOTE — ED Notes (Signed)
Hospital meal provided, pt tolerated w/o complaints.  Waste discarded appropriately.  

## 2022-02-12 NOTE — ED Notes (Signed)
Patient is taking shower at this time.

## 2022-02-12 NOTE — ED Notes (Signed)
Report received from Kimberly S , RN including SBAR. On initial round after report Pt is warm/dry, resting quietly in room without any s/s of distress.  Will continue to monitor throughout shift as ordered for any changes in behaviors and for continued safety.   

## 2022-02-13 DIAGNOSIS — F250 Schizoaffective disorder, bipolar type: Secondary | ICD-10-CM | POA: Diagnosis not present

## 2022-02-13 NOTE — ED Notes (Signed)
Pt given nighttime snack. 

## 2022-02-13 NOTE — ED Provider Notes (Signed)
Emergency Medicine Observation Re-evaluation Note  Jackson [name] is a 36 y.o. male, seen on rounds today.  Pt initially presented to the ED for complaints of Psychiatric Evaluation Currently, the patient is calm, resting.  Physical Exam  BP 123/85 (BP Location: Right Arm)   Pulse 81   Temp 98.6 F (37 C) (Oral)   Resp 17   Ht 6' (1.829 m)   Wt 113 kg   SpO2 97%   BMI 33.79 kg/m    ED Course / MDM  EKG:EKG Interpretation  Date/Time:  Thursday January 30 2022 01:59:27 EDT Ventricular Rate:  78 PR Interval:  190 QRS Duration: 78 QT Interval:  336 QTC Calculation: 383 R Axis:   63 Text Interpretation: Normal sinus rhythm Abnormal QRS-T angle, consider primary T wave abnormality Abnormal ECG When compared with ECG of 13-Feb-2021 16:33, No significant change was found Confirmed by UNCONFIRMED, DOCTOR ([id number]), editor Kearney Hard ([id number]) on 01/30/2022 1:21:03 PM  I have reviewed the labs performed to date as well as medications administered while in observation.  Recent changes in the last 24 hours include None.  Plan  Current plan is for SW placement.  Erika Mantell is not under involuntary commitment.     [name], MD 02/13/22 [phone]

## 2022-02-13 NOTE — ED Notes (Signed)
Report received from Amy, RN including SBAR. Patient alert and oriented, warm and dry, in no acute distress. Patient denies SI, HI, AVH and pain. Patient made aware of Q15 minute rounds and security cameras for their safety. Patient instructed to come to this nurse with needs or concerns.  

## 2022-02-14 DIAGNOSIS — F250 Schizoaffective disorder, bipolar type: Secondary | ICD-10-CM | POA: Diagnosis not present

## 2022-02-14 NOTE — ED Notes (Signed)
Report to include Situation, Background, Assessment, and Recommendations received from [place], [place]. Patient alert and oriented, warm and dry, in no acute distress.  Patient made aware of Q15 minute rounds and Psychologist, counselling presence for their safety. Patient instructed to come to me with needs or concerns.

## 2022-02-14 NOTE — ED Notes (Signed)
Report to include Situation, Background, Assessment, and Recommendations received from Dorothy RN. Patient alert and oriented, warm and dry, in no acute distress. Patient denies SI, HI, AVH and pain. Patient made aware of Q15 minute rounds and security cameras for their safety. Patient instructed to come to me with needs or concerns.  

## 2022-02-14 NOTE — ED Notes (Signed)
VOL/Pending Placement 

## 2022-02-14 NOTE — ED Notes (Signed)
VOL/pending placement 

## 2022-02-14 NOTE — ED Notes (Signed)
Snack and beverage given. 

## 2022-02-15 DIAGNOSIS — F250 Schizoaffective disorder, bipolar type: Secondary | ICD-10-CM | POA: Diagnosis not present

## 2022-02-15 NOTE — ED Notes (Signed)
VOL/pending placement 

## 2022-02-15 NOTE — ED Notes (Signed)
Pt out of shower at this time, dinner tray and drink given to pt at this time.

## 2022-02-15 NOTE — ED Notes (Signed)
Pt given ice cream. This Clinical research associate out in dayroom having conversation with pt. Pt behavior appropriate.

## 2022-02-15 NOTE — ED Provider Notes (Signed)
Emergency Medicine Observation Re-evaluation Note  Jackson [name] is a 36 y.o. male, seen on rounds today.  Pt initially presented to the ED for complaints of Psychiatric Evaluation  Currently, the patient is laying in bed- no events overnight per [facility] nurse   Physical Exam  Blood pressure 109/74, pulse 67, temperature 97.9 F (36.6 C), temperature source Oral, resp. rate 18, height 6' (1.829 m), weight 113 kg, SpO2 100 %.  Physical Exam General: No apparent distress Pulm: Normal WOB Neuro: resting      ED Course / MDM     I have reviewed the labs performed to date as well as medications administered while in observation.  Recent changes in the last 24 hours include none  Plan   Current plan is to continue to wait for placement Patient is not under full IVC at this time.   Concha Se, MD 02/15/22 [phone]

## 2022-02-15 NOTE — ED Notes (Signed)
Pt given cereal and chocolate milk, okayed by Freddy Jaksch.

## 2022-02-15 NOTE — ED Notes (Signed)
Report to include Situation, Background, Assessment, and Recommendations received from Dorothy RN. Patient alert and oriented, warm and dry, in no acute distress. Patient denies SI, HI, AVH and pain. Patient made aware of Q15 minute rounds and security cameras for their safety. Patient instructed to come to me with needs or concerns.  

## 2022-02-15 NOTE — TOC Progression Note (Signed)
Transition of Care [facility]) - Progression Note    Patient Details  Name: Jackson [name] MRN: [id number] Date of Birth: Jul 16, 1986  [facility]) CM/SW Contact  Liliana Cline, LCSW Phone Number: 02/15/2022, 9:18 AM  Clinical Narrative:    CSW called [facility], spoke with [facility] in Admissions. He stated they would need to know why patient was not referred to [facility]. CSW informed him that per notes, [facility] denied [facility]. Informed Harlow Ohms that patient is a [place] resident and his guardian is in that county. Harlow Ohms requested CSW call and see why [facility] denied patient then call back.  CSW called and spoke with [facility] Admissions. They stated patient did not meet criteria, could not give specifics. They stated patient needs a group home.  CSW called Harlow Ohms at [facility] [facility] back with update. He stated they would also state patient does not meet criteria.    Expected Discharge Plan: Group Home Barriers to Discharge: ED Unsafe disposition  Expected Discharge Plan and Services Expected Discharge Plan: Group Home   Discharge Planning Services: CM Consult   Living arrangements for the past 2 months: Boarding House                 DME Arranged: N/A DME Agency: NA       HH Arranged: NA HH Agency: NA         Social Determinants of Health (SDOH) Interventions    Readmission Risk Interventions     No data to display

## 2022-02-15 NOTE — ED Notes (Signed)
Snack and beverage given. 

## 2022-02-15 NOTE — ED Notes (Signed)
Pt given shower supplies, currently in shower. 

## 2022-02-15 NOTE — ED Notes (Signed)
Pt given breakfast tray and drink, continues to sleep. 

## 2022-02-16 DIAGNOSIS — F250 Schizoaffective disorder, bipolar type: Secondary | ICD-10-CM | POA: Diagnosis not present

## 2022-02-16 NOTE — ED Notes (Signed)
Pt given dinner tray.

## 2022-02-16 NOTE — ED Notes (Signed)
Pt was given breakfast and a drink at this time. 

## 2022-02-16 NOTE — ED Notes (Signed)
Report to include Situation, Background, Assessment, and Recommendations received from Katie RN. Patient alert and oriented, warm and dry, in no acute distress. Patient denies SI, HI, AVH and pain. Patient made aware of Q15 minute rounds and security cameras for their safety. Patient instructed to come to me with needs or concerns.  

## 2022-02-16 NOTE — ED Notes (Signed)
Report received from [name], RN including SBAR. On initial round after report Pt is warm/dry, resting quietly in room without any s/s of distress.  Will continue to monitor throughout shift as ordered for any changes in behaviors and for continued safety.   

## 2022-02-16 NOTE — ED Provider Notes (Signed)
Emergency Medicine Observation Re-evaluation Note  Jackson [name] is a 36 y.o. male, seen on rounds today.  Pt initially presented to the ED for complaints of Psychiatric Evaluation Currently, the patient is resting comfortably.  Physical Exam  BP 103/68   Pulse 74   Temp 97.8 F (36.6 C) (Oral)   Resp 16   Ht 6' (1.829 m)   Wt 113 kg   SpO2 97%   BMI 33.79 kg/m  Physical Exam General: No acute distress Cardiac: Well-perfused extremities Lungs: No respiratory distress Psych: Appropriate mood and affect  ED Course / MDM  EKG:EKG Interpretation  Date/Time:  Thursday January 30 2022 01:59:27 EDT Ventricular Rate:  78 PR Interval:  190 QRS Duration: 78 QT Interval:  336 QTC Calculation: 383 R Axis:   63 Text Interpretation: Normal sinus rhythm Abnormal QRS-T angle, consider primary T wave abnormality Abnormal ECG When compared with ECG of 13-Feb-2021 16:33, No significant change was found Confirmed by UNCONFIRMED, DOCTOR ([id number]), editor Kearney Hard ([id number]) on 01/30/2022 1:21:03 PM  I have reviewed the labs performed to date as well as medications administered while in observation.  Recent changes in the last 24 hours include none.  Plan  Current plan is for placement.  Jackson [name] is not under involuntary commitment.     Merwyn Katos, MD 02/16/22 [phone]

## 2022-02-16 NOTE — ED Notes (Signed)
Patient provided snack at appropriate snack time.  Pt consumed 100% of snack provided, tolerated well w/o complaints   Trash disposted of appropriately by patient.  

## 2022-02-17 DIAGNOSIS — F250 Schizoaffective disorder, bipolar type: Secondary | ICD-10-CM | POA: Diagnosis not present

## 2022-02-17 NOTE — ED Notes (Signed)
VOL  PENDING  PLACEMENT 

## 2022-02-17 NOTE — ED Notes (Signed)
Pt given snack. 
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
## 2022-02-17 NOTE — ED Notes (Signed)
Patient is safe, denies Si/hi or avh, lunch tray given to Patient and He ate 100% of meal and beverage.

## 2022-02-17 NOTE — ED Notes (Signed)
Pt sitting in chair in day room talking to 2 other patients. No distress noted at this time. Pt talkative and pleasant.

## 2022-02-17 NOTE — ED Provider Notes (Signed)
Emergency Medicine Observation Re-evaluation Note  Jackson [name] is a 36 y.o. male, seen on rounds today.    Physical Exam  BP 111/76 (BP Location: Left Arm)   Pulse 100   Temp 98.2 F (36.8 C) (Oral)   Resp 18   Ht 6' (1.829 m)   Wt 113 kg   SpO2 95%   BMI 33.79 kg/m  Physical Exam General: Patient resting comfortably in bed Lungs: Patient not in respiratory distress Psych: Patient not combative  ED Course / MDM  EKG:  Plan  Current plan is for placement.  Jackson [name] is not under involuntary commitment.     Jackson Natal, MD 02/17/22 [phone]

## 2022-02-17 NOTE — ED Notes (Signed)
Breakfast given at 930 

## 2022-02-17 NOTE — ED Notes (Signed)
Patient up at nursing station asking for drink, he is calm and cooperative, will continue to monitor.

## 2022-02-17 NOTE — ED Notes (Signed)
VOL pending placement most recent note on chart 

## 2022-02-18 DIAGNOSIS — F250 Schizoaffective disorder, bipolar type: Secondary | ICD-10-CM | POA: Diagnosis not present

## 2022-02-18 NOTE — ED Provider Notes (Signed)
Emergency Medicine Observation Re-evaluation Note  Jackson [name] is a 36 y.o. male, seen on rounds today.    Physical Exam  BP 112/60 (BP Location: Right Arm)   Pulse 78   Temp 98 F (36.7 C) (Oral)   Resp 18   Ht 6' (1.829 m)   Wt 113 kg   SpO2 99%   BMI 33.79 kg/m  Physical Exam General: Patient resting comfortably in bed Lungs: Patient not in respiratory distress Psych: Patient not combative  ED Course / MDM  EKG:  Plan  Current plan is for placement.  Dorin Schuler is not under involuntary commitment.     Arnaldo Natal, MD 02/18/22 [phone]

## 2022-02-18 NOTE — ED Notes (Signed)
VOL  PENDING  PLACEMENT 

## 2022-02-18 NOTE — Progress Notes (Signed)
   02/18/22 1425  Clinical Encounter Type  Visited With Patient;Health care provider  Visit Type Follow-up;Social support;Spiritual support   Loel Lofty, Charity fundraiser and pt with others in dayroom for a group activity to facilitate sharing and therapeutic conversation.  Remained with pt and others for social support. Will continue to follow and offer supportive presence.

## 2022-02-18 NOTE — ED Notes (Signed)
Patient provided snack at appropriate snack time.  Pt consumed 100% of snack provided, tolerated well w/o complaints   Trash disposted of appropriately by patient.  

## 2022-02-18 NOTE — ED Notes (Signed)
Report received from Ally, RN including SBAR. Patient alert and oriented, warm and dry, in no acute distress. Patient denies SI, HI, AVH and pain. Patient made aware of Q15 minute rounds and security cameras for their safety. Patient instructed to come to this nurse with needs or concerns. 

## 2022-02-18 NOTE — ED Notes (Signed)
Pt given snack. 

## 2022-02-18 NOTE — ED Notes (Signed)
Hospital meal provided.  100% consumed, pt tolerated w/o complaints.  Waste discarded appropriately.   

## 2022-02-18 NOTE — ED Notes (Signed)
VOL/pending placement 
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
## 2022-02-18 NOTE — ED Notes (Signed)
Pt given a snack at this time.  

## 2022-02-19 DIAGNOSIS — F250 Schizoaffective disorder, bipolar type: Secondary | ICD-10-CM | POA: Diagnosis not present

## 2022-02-19 NOTE — ED Provider Notes (Signed)
Emergency Medicine Observation Re-evaluation Note  Jackson [name] is a 36 y.o. male, seen on rounds today.   Physical Exam  BP (!) 105/48 (BP Location: Left Arm)   Pulse 83   Temp 98.8 F (37.1 C) (Oral)   Resp 20   Ht 6' (1.829 m)   Wt 113 kg   SpO2 96%   BMI 33.79 kg/m  Physical Exam General: Patient resting comfortably in bed Lungs: Patient not in respiratory distress Psych: Patient not combative  ED Course / MDM  EKG:  Plan  Current plan is for placement.  Jackson [name] is not under involuntary commitment.     Arnaldo Natal, MD 02/19/22 [phone]

## 2022-02-19 NOTE — ED Notes (Signed)
[employer] with pt

## 2022-02-19 NOTE — ED Notes (Signed)
VOL  PENDING  PLACEMENT 

## 2022-02-19 NOTE — ED Notes (Signed)
Report received from [name], RN including SBAR. On initial round after report Pt is warm/dry, resting quietly in room without any s/s of distress.  Will continue to monitor throughout shift as ordered for any changes in behaviors and for continued safety.   

## 2022-02-19 NOTE — ED Notes (Signed)
Hospital meal provided, pt tolerated w/o complaints.  Waste discarded appropriately.  

## 2022-02-19 NOTE — ED Notes (Signed)
Vol placement see note on chart 

## 2022-02-20 DIAGNOSIS — F250 Schizoaffective disorder, bipolar type: Secondary | ICD-10-CM | POA: Diagnosis not present

## 2022-02-20 NOTE — ED Notes (Signed)
Hospital meal provided, pt tolerated w/o complaints.  Waste discarded appropriately.  

## 2022-02-20 NOTE — ED Notes (Signed)
Report received from Jennifer, RN including SBAR. Patient alert and oriented, warm and dry, in no acute distress. Patient denies SI, HI, AVH and pain. Patient made aware of Q15 minute rounds and security cameras for their safety. Patient instructed to come to this nurse with needs or concerns.  

## 2022-02-20 NOTE — ED Notes (Signed)
VOl pending placement or dispo  see note on chart

## 2022-02-20 NOTE — ED Notes (Signed)
Pt given dinner tray and drink 

## 2022-02-20 NOTE — ED Provider Notes (Signed)
Emergency Medicine Observation Re-evaluation Note  Jackson [name] is a 36 y.o. male, seen on rounds today.  Pt initially presented to the ED for complaints of Psychiatric Evaluation  Currently, the patient is resting in bed   Physical Exam  Blood pressure 103/66, pulse 72, temperature 98 F (36.7 C), temperature source Oral, resp. rate 16, height 6' (1.829 m), weight 113 kg, SpO2 98 %.  Physical Exam General: No apparent distress Pulm: Normal WOB Neuro: resting      ED Course / MDM     I have reviewed the labs performed to date as well as medications administered while in observation.  Recent changes in the last 24 hours include none   Plan   Current plan is to continue to wait for placement  Patient is not under full IVC at this time.   Concha Se, MD 02/20/22 [phone]

## 2022-02-21 DIAGNOSIS — F250 Schizoaffective disorder, bipolar type: Secondary | ICD-10-CM | POA: Diagnosis not present

## 2022-02-21 NOTE — ED Notes (Signed)
Vol /pending placement 

## 2022-02-21 NOTE — ED Notes (Signed)
VOL  PENDING  PLACEMENT 

## 2022-02-21 NOTE — ED Notes (Signed)
Patient given lunch tray.

## 2022-02-21 NOTE — ED Notes (Signed)
Snack and beverage given. 

## 2022-02-21 NOTE — TOC Progression Note (Signed)
Transition of Care [facility]) - Progression Note    Patient Details  Name: Pacey Portugal MRN: [id number] Date of Birth: 1985-10-21  Transition of Care [facility]) CM/SW Contact  Allayne Butcher, RN Phone Number: 02/21/2022, 1:46 PM  Clinical Narrative:    RNCM received an email from CJ and Moro, parents of [place].  In the email :   "this week, we spoke with two (2) members of the Alliance Team (both the Case Worker and Housing rep) and they are both committed to helping with a few locations they are aware of that could offer care safe for [place]. Hallie equally expressed willingness in this level of care of which we have formally submitted applications and are partnering with the Alliance Team on for admission approval. "  Requested the information for the Alliance team Case worker and told CJ and Gala Romney that I would be available to meet next week if they would just let me know ahead of time.       Expected Discharge Plan: Group Home Barriers to Discharge: ED Unsafe disposition  Expected Discharge Plan and Services Expected Discharge Plan: Group Home   Discharge Planning Services: CM Consult   Living arrangements for the past 2 months: Boarding House                 DME Arranged: N/A DME Agency: NA       HH Arranged: NA HH Agency: NA         Social Determinants of Health (SDOH) Interventions    Readmission Risk Interventions     No data to display

## 2022-02-21 NOTE — ED Notes (Signed)
Pt received breakfast tray at this time

## 2022-02-21 NOTE — ED Notes (Signed)
Report to include Situation, Background, Assessment, and Recommendations received from Dorothy RN. Patient alert and oriented, warm and dry, in no acute distress. Patient denies SI, HI, AVH and pain. Patient made aware of Q15 minute rounds and security cameras for their safety. Patient instructed to come to me with needs or concerns.  

## 2022-02-22 DIAGNOSIS — F250 Schizoaffective disorder, bipolar type: Secondary | ICD-10-CM | POA: Diagnosis not present

## 2022-02-22 NOTE — ED Notes (Signed)
VOL placement see most current note on chart

## 2022-02-22 NOTE — ED Provider Notes (Signed)
Emergency Medicine Observation Re-evaluation Note  Jackson [name] is a 36 y.o. male, seen on rounds today.   Physical Exam  BP (!) 122/58 (BP Location: Right Arm)   Pulse 68   Temp 97.9 F (36.6 C) (Oral)   Resp 18   Ht 6' (1.829 m)   Wt 113 kg   SpO2 99%   BMI 33.79 kg/m  Physical Exam General: Patient resting comfortably in bed Lungs: Patient in no respiratory distress Psych: Patient not combative  ED Course / MDM  EKG:  Plan  Current plan is for placement.  Avrum Moralez is not under involuntary commitment.     Arnaldo Natal, MD 02/22/22 [phone]

## 2022-02-22 NOTE — ED Notes (Signed)
Snack and beverage given. 

## 2022-02-22 NOTE — ED Notes (Signed)
VOL/Pending Placement 

## 2022-02-22 NOTE — ED Notes (Signed)
Report to include Situation, Background, Assessment, and Recommendations received from Dorothy RN. Patient alert and oriented, warm and dry, in no acute distress. Patient denies SI, HI, AVH and pain. Patient made aware of Q15 minute rounds and security cameras for their safety. Patient instructed to come to me with needs or concerns.  

## 2022-02-22 NOTE — ED Notes (Signed)
Pt given lunch tray and drink at this time. 

## 2022-02-23 DIAGNOSIS — F250 Schizoaffective disorder, bipolar type: Secondary | ICD-10-CM | POA: Diagnosis not present

## 2022-02-23 NOTE — ED Notes (Signed)
Report to include Situation, Background, Assessment, and Recommendations received from Katie RN. Patient alert and oriented, warm and dry, in no acute distress. Patient denies SI, HI, AVH and pain. Patient made aware of Q15 minute rounds and security cameras for their safety. Patient instructed to come to me with needs or concerns.  

## 2022-02-23 NOTE — ED Notes (Signed)
Snack and beverage given. 

## 2022-02-23 NOTE — ED Notes (Signed)
Report received from [name], RN including SBAR. On initial round after report Jackson [name] is warm/dry, resting quietly in room without any s/s of distress.  Will continue to monitor throughout shift as ordered for any changes in behaviors and for continued safety.   

## 2022-02-23 NOTE — ED Notes (Signed)
VOL/pending placement 

## 2022-02-24 DIAGNOSIS — F250 Schizoaffective disorder, bipolar type: Secondary | ICD-10-CM | POA: Diagnosis not present

## 2022-02-24 NOTE — ED Provider Notes (Signed)
Emergency Medicine Observation Re-evaluation Note  Jackson [name] is a 36 y.o. male, seen on rounds today.  Pt initially presented to the ED for complaints of Psychiatric Evaluation Currently, the patient is calm, resting.  Physical Exam  BP 114/63 (BP Location: Right Arm)   Pulse 80   Temp 98 F (36.7 C) (Oral)   Resp 18   Ht 6' (1.829 m)   Wt 113 kg   SpO2 99%   BMI 33.79 kg/m   ED Course / MDM  EKG:EKG Interpretation  Date/Time:  Thursday January 30 2022 01:59:27 EDT Ventricular Rate:  78 PR Interval:  190 QRS Duration: 78 QT Interval:  336 QTC Calculation: 383 R Axis:   63 Text Interpretation: Normal sinus rhythm Abnormal QRS-T angle, consider primary T wave abnormality Abnormal ECG When compared with ECG of 13-Feb-2021 16:33, No significant change was found Confirmed by UNCONFIRMED, DOCTOR ([id number]), editor Kearney Hard ([id number]) on 01/30/2022 1:21:03 PM  I have reviewed the labs performed to date as well as medications administered while in observation.  Recent changes in the last 24 hours include none.  Plan  Current plan is for SW disposition.  Jackson [name] is not under involuntary commitment.     [name], MD 02/24/22 [phone]

## 2022-02-24 NOTE — ED Notes (Signed)
Snack and beverage given. 

## 2022-02-24 NOTE — ED Notes (Signed)
Pt given lunch tray.

## 2022-02-24 NOTE — ED Notes (Signed)
Breakfast tray and beverage given to pt. Pt sitting in dayroom to eat.

## 2022-02-24 NOTE — ED Notes (Signed)
Report received from andrea, English as a second language teacher. Patient alert and oriented, warm and dry, in no acute distress. Patient denies SI, HI, AVH and pain. Patient made aware of Q15 minute rounds and security cameras for their safety. Patient instructed to come to this nurse with needs or concerns.

## 2022-02-24 NOTE — ED Notes (Signed)
Pt used restroom ?

## 2022-02-24 NOTE — ED Notes (Signed)
VOL  PENDING  PLACEMENT 

## 2022-02-24 NOTE — ED Notes (Signed)
Snack and drink provided. ?

## 2022-02-24 NOTE — ED Notes (Signed)
VOL/Pending Placement 

## 2022-02-24 NOTE — ED Notes (Signed)
Pt to restroom

## 2022-02-25 DIAGNOSIS — F250 Schizoaffective disorder, bipolar type: Secondary | ICD-10-CM | POA: Diagnosis not present

## 2022-02-25 NOTE — ED Notes (Signed)
Pt given dinner tray and drink 

## 2022-02-25 NOTE — ED Notes (Signed)
Report received from Katie, RN including SBAR. Patient alert and oriented, warm and dry, in no acute distress. Patient denies SI, HI, AVH and pain. Patient made aware of Q15 minute rounds and security cameras for their safety. Patient instructed to come to this nurse with needs or concerns.  

## 2022-02-25 NOTE — ED Notes (Signed)
Report received from Jackson B, RN including SBAR. On initial round after report Pt is warm/dry, resting quietly in room without any s/s of distress.  Will continue to monitor throughout shift as ordered for any changes in behaviors and for continued safety.   

## 2022-02-25 NOTE — ED Notes (Signed)
VOL/pending placement 

## 2022-02-25 NOTE — ED Notes (Signed)
Pt used restroom ?

## 2022-02-25 NOTE — ED Provider Notes (Signed)
Emergency Medicine Observation Re-evaluation Note  Jackson [name] is a 36 y.o. male, seen on rounds today.  Pt initially presented to the ED for complaints of Psychiatric Evaluation   Physical Exam  BP 94/69   Pulse 86   Temp 98.3 F (36.8 C)   Resp 17   Ht 6' (1.829 m)   Wt 113 kg   SpO2 96%   BMI 33.79 kg/m  Physical Exam  ED Course / MDM  EKG:EKG Interpretation  Date/Time:  Thursday January 30 2022 01:59:27 EDT Ventricular Rate:  78 PR Interval:  190 QRS Duration: 78 QT Interval:  336 QTC Calculation: 383 R Axis:   63 Text Interpretation: Normal sinus rhythm Abnormal QRS-T angle, consider primary T wave abnormality Abnormal ECG When compared with ECG of 13-Feb-2021 16:33, No significant change was found Confirmed by UNCONFIRMED, DOCTOR ([id number]), editor Kearney Hard ([id number]) on 01/30/2022 1:21:03 PM  I have reviewed the labs performed to date as well as medications administered while in observation.    Plan  Current plan is for SW  Jackson [name] is not under involuntary commitment.     Willy Eddy, MD 02/25/22 [phone]

## 2022-02-25 NOTE — ED Notes (Signed)
Snack and drink was given 

## 2022-02-25 NOTE — ED Notes (Signed)
Vol /pending placement 

## 2022-02-26 DIAGNOSIS — F250 Schizoaffective disorder, bipolar type: Secondary | ICD-10-CM | POA: Diagnosis not present

## 2022-02-26 NOTE — ED Notes (Signed)

## 2022-02-26 NOTE — ED Provider Notes (Signed)
Emergency Medicine Observation Re-evaluation Note  Jackson [name] is a 36 y.o. male, seen on rounds today.  Pt initially presented to the ED for complaints of Psychiatric Evaluation  Currently, the patient is calm, no acute complaints.  Physical Exam  Blood pressure 108/78, pulse 67, temperature 98 F (36.7 C), temperature source Oral, resp. rate 17, height 6' (1.829 m), weight 113 kg, SpO2 98 %. Physical Exam General: NAD Lungs: CTAB Psych: not agitated  ED Course / MDM  EKG:    I have reviewed the labs performed to date as well as medications administered while in observation.  Recent changes in the last 24 hours include no acute events overnight.    Plan  Current plan is for SW placement. Patient is not under full IVC at this time.   Sharman Cheek, MD 02/26/22 [id number]

## 2022-02-26 NOTE — ED Notes (Signed)
Pt given snack. 

## 2022-02-26 NOTE — ED Notes (Signed)
Vol /pending placement 

## 2022-02-26 NOTE — ED Notes (Signed)
Patient ate 100% of supper and beverage.  

## 2022-02-26 NOTE — ED Notes (Signed)
VOL pending placement  last note on chart 7/21

## 2022-02-26 NOTE — ED Notes (Signed)
Patient is awake and up to bathroom, no signs of distress, Patient is pleasant, medications to be administered.

## 2022-02-27 DIAGNOSIS — F250 Schizoaffective disorder, bipolar type: Secondary | ICD-10-CM | POA: Diagnosis not present

## 2022-02-27 NOTE — ED Notes (Signed)
Pt given lunch tray and drink 

## 2022-02-27 NOTE — ED Notes (Signed)
VOL  PENDING  PLACEMENT 

## 2022-02-27 NOTE — ED Notes (Signed)
Report received from Jennifer, RN including SBAR. Patient alert and oriented, warm and dry, in no acute distress. Patient denies SI, HI, AVH and pain. Patient made aware of Q15 minute rounds and security cameras for their safety. Patient instructed to come to this nurse with needs or concerns.  

## 2022-02-27 NOTE — ED Notes (Signed)
Pt given snack tray and drink 

## 2022-02-27 NOTE — ED Notes (Signed)
Pt given dinner tray and drink at this time. 

## 2022-02-28 DIAGNOSIS — F250 Schizoaffective disorder, bipolar type: Secondary | ICD-10-CM | POA: Diagnosis not present

## 2022-02-28 NOTE — ED Notes (Signed)
Pt given snack. 

## 2022-02-28 NOTE — ED Notes (Signed)
Dinner tray and beverage provided 

## 2022-02-28 NOTE — ED Notes (Signed)
Snack and beverage given. 

## 2022-02-28 NOTE — ED Notes (Signed)
Report to include Situation, Background, Assessment, and Recommendations received from Andrea RN. Patient alert and oriented, warm and dry, in no acute distress. Patient denies SI, HI, AVH and pain. Patient made aware of Q15 minute rounds and security cameras for their safety. Patient instructed to come to me with needs or concerns.  

## 2022-02-28 NOTE — ED Provider Notes (Signed)
Emergency Medicine Observation Re-evaluation Note  Jackson [name] is a 36 y.o. male, seen on rounds today.  Pt initially presented to the ED for complaints of Psychiatric Evaluation No Acute concerns from nursing staff this AM.   Physical Exam  BP (!) 98/55 (BP Location: Right Arm)   Pulse 77   Temp 97.9 F (36.6 C) (Oral)   Resp 17   Ht 6' (1.829 m)   Wt 113 kg   SpO2 98%   BMI 33.79 kg/m  Physical Exam  Psych: calm  ED Course / MDM  EKG:EKG Interpretation  Date/Time:  Thursday January 30 2022 01:59:27 EDT Ventricular Rate:  78 PR Interval:  190 QRS Duration: 78 QT Interval:  336 QTC Calculation: 383 R Axis:   63 Text Interpretation: Normal sinus rhythm Abnormal QRS-T angle, consider primary T wave abnormality Abnormal ECG When compared with ECG of 13-Feb-2021 16:33, No significant change was found Confirmed by UNCONFIRMED, DOCTOR ([id number]), editor Kearney Hard ([id number]) on 01/30/2022 1:21:03 PM  I have reviewed the labs performed to date as well as medications administered while in observation.  Recent changes in the last 24 hours include none.  Plan  Current plan is for social work dispo.  Alyaan Kuechle is not under involuntary commitment.     Gilles Chiquito, MD 02/28/22 [phone]

## 2022-03-01 DIAGNOSIS — F250 Schizoaffective disorder, bipolar type: Secondary | ICD-10-CM | POA: Diagnosis not present

## 2022-03-01 NOTE — ED Notes (Signed)
Pt walking around day room no distress noted

## 2022-03-01 NOTE — ED Notes (Signed)
Pt walked to the bathroom. 

## 2022-03-01 NOTE — ED Notes (Signed)
Provided pt with a dinner tray  

## 2022-03-01 NOTE — ED Notes (Signed)
Report to include Situation, Background, Assessment, and Recommendations received from Silvia RN. Patient alert and oriented, warm and dry, in no acute distress. Patient denies SI, HI, AVH and pain. Patient made aware of Q15 minute rounds and security cameras for their safety. Patient instructed to come to me with needs or concerns.  

## 2022-03-01 NOTE — ED Notes (Signed)
Administered snack to pt no other needs at present

## 2022-03-01 NOTE — ED Notes (Signed)
VOL/pending placement 

## 2022-03-01 NOTE — ED Notes (Signed)
Breakfast tray and beverage provided 

## 2022-03-01 NOTE — ED Notes (Signed)
Pt asked for bathroom key. Security officer opened the bathroom door for pt

## 2022-03-01 NOTE — ED Notes (Signed)
Snack and beverage given. 

## 2022-03-01 NOTE — ED Provider Notes (Signed)
Emergency Medicine Observation Re-evaluation Note  [name] is a 36 y.o. male, seen on rounds today.  Pt initially presented to the ED for complaints of Psychiatric Evaluation   Physical Exam  BP 117/66 (BP Location: Right Arm)   Pulse 73   Temp 98 F (36.7 C) (Oral)   Resp 18   Ht 6' (1.829 m)   Wt 113 kg   SpO2 99%   BMI 33.79 kg/m  Physical Exam General: resting  ED Course / MDM  EKG:EKG Interpretation  Date/Time:  Thursday January 30 2022 01:59:27 EDT Ventricular Rate:  78 PR Interval:  190 QRS Duration: 78 QT Interval:  336 QTC Calculation: 383 R Axis:   63 Text Interpretation: Normal sinus rhythm Abnormal QRS-T angle, consider primary T wave abnormality Abnormal ECG When compared with ECG of 13-Feb-2021 16:33, No significant change was found Confirmed by UNCONFIRMED, DOCTOR ([id number]), editor Kearney Hard ([id number]) on 01/30/2022 1:21:03 PM  I have reviewed the labs performed to date as well as medications administered while in observation.  Recent changes in the last 24 hours include none.  Plan  Current plan is for sw.  Aleksandr Grosshans is not under involuntary commitment.     Willy Eddy, MD 03/01/22 [phone]

## 2022-03-01 NOTE — ED Notes (Signed)
Lunch tray provided with beverage

## 2022-03-01 NOTE — ED Notes (Signed)
Provided pt with phone informed he can utilize the phone for 10 minutes

## 2022-03-02 DIAGNOSIS — F250 Schizoaffective disorder, bipolar type: Secondary | ICD-10-CM | POA: Diagnosis not present

## 2022-03-02 NOTE — ED Provider Notes (Signed)
Emergency Medicine Observation Re-evaluation Note  Jackson [name] is a 36 y.o. male, seen on rounds today.  Pt initially presented to the ED for complaints of Psychiatric Evaluation   Physical Exam  BP 132/68 (BP Location: Right Arm)   Pulse 96   Temp 98 F (36.7 C) (Oral)   Resp 18   Ht 6' (1.829 m)   Wt 113 kg   SpO2 99%   BMI 33.79 kg/m  Physical Exam General: Alerts to voice, advises no concerns but would like to be able to use a toenail clipper to clip his toenails today. (Nursing and security aware of this request)  ED Course / MDM  EKG:  I have reviewed the labs performed to date as well as medications administered while in observation.  Recent changes in the last 24 hours include none.  Plan  Current plan is for sw.  Jackson [name] is not under involuntary commitment.  Plan to have nursing check to see if a toenail clipper might be something we could provide for him, he could use it under direct observation, and then have it return to a secured area/nursing desk.   Jackson Creamer, MD 03/02/22 0730

## 2022-03-02 NOTE — ED Notes (Signed)
Snack and beverage given. 

## 2022-03-02 NOTE — ED Notes (Signed)
Patient is vol pending placement 

## 2022-03-02 NOTE — ED Notes (Signed)
Report to include Situation, Background, Assessment, and Recommendations received from William RN. Patient alert and oriented, warm and dry, in no acute distress. Patient denies SI, HI, AVH and pain. Patient made aware of Q15 minute rounds and security cameras for their safety. Patient instructed to come to me with needs or concerns.  

## 2022-03-03 DIAGNOSIS — F250 Schizoaffective disorder, bipolar type: Secondary | ICD-10-CM | POA: Diagnosis not present

## 2022-03-03 NOTE — ED Notes (Signed)
Snack and beverage given. 

## 2022-03-03 NOTE — ED Provider Notes (Signed)
Emergency Medicine Observation Re-evaluation Note  Jackson [name] is a 36 y.o. male, seen on rounds today.  Pt initially presented to the ED for complaints of Psychiatric Evaluation Currently, the patient is calm, resting.  Physical Exam  BP 103/68 (BP Location: Left Arm)   Pulse 94   Temp 98.2 F (36.8 C) (Oral)   Resp 18   Ht 6' (1.829 m)   Wt 113 kg   SpO2 95%   BMI 33.79 kg/m    ED Course / MDM  EKG:EKG Interpretation  Date/Time:  Thursday January 30 2022 01:59:27 EDT Ventricular Rate:  78 PR Interval:  190 QRS Duration: 78 QT Interval:  336 QTC Calculation: 383 R Axis:   63 Text Interpretation: Normal sinus rhythm Abnormal QRS-T angle, consider primary T wave abnormality Abnormal ECG When compared with ECG of 13-Feb-2021 16:33, No significant change was found Confirmed by UNCONFIRMED, DOCTOR ([id number]), editor Kearney Hard ([id number]) on 01/30/2022 1:21:03 PM  I have reviewed the labs performed to date as well as medications administered while in observation.  Recent changes in the last 24 hours include none.  Plan  Current plan is for sw dispo.  Jackson [name] is not under involuntary commitment.     Jackson Pollack, MD 03/03/22 [phone]

## 2022-03-03 NOTE — ED Notes (Signed)
VOL placement see latest note on chart

## 2022-03-03 NOTE — ED Notes (Signed)
VOL/Pending Placement 

## 2022-03-03 NOTE — ED Notes (Signed)
Pt set up and took shower

## 2022-03-03 NOTE — TOC Progression Note (Signed)
Transition of Care [facility]) - Progression Note    Patient Details  Name: Jackson [name] MRN: [id number] Date of Birth: 12-26-1985  Transition of Care [facility]) CM/SW Contact  Allayne Butcher, RN Phone Number: 03/03/2022, 2:26 PM  Clinical Narrative:    RNCM spoke with patient's mother and stepfather this morning.  Mother CJ reports that they have found a program for Mason in [place] and a Group Home for him.  CJ reports that she will email me the details and that they need a CCA that they will get from [place] [facility].     Expected Discharge Plan: Group Home Barriers to Discharge: ED Unsafe disposition  Expected Discharge Plan and Services Expected Discharge Plan: Group Home   Discharge Planning Services: CM Consult   Living arrangements for the past 2 months: Boarding House                 DME Arranged: N/A DME Agency: NA       HH Arranged: NA HH Agency: NA         Social Determinants of Health (SDOH) Interventions    Readmission Risk Interventions     No data to display

## 2022-03-03 NOTE — ED Notes (Addendum)
Hospital meal provided, pt tolerated w/o complaints.  Waste discarded appropriately.  

## 2022-03-04 DIAGNOSIS — F29 Unspecified psychosis not due to a substance or known physiological condition: Secondary | ICD-10-CM | POA: Diagnosis not present

## 2022-03-04 DIAGNOSIS — R451 Restlessness and agitation: Secondary | ICD-10-CM | POA: Diagnosis not present

## 2022-03-04 DIAGNOSIS — Z20822 Contact with and (suspected) exposure to covid-19: Secondary | ICD-10-CM | POA: Diagnosis not present

## 2022-03-04 DIAGNOSIS — Z046 Encounter for general psychiatric examination, requested by authority: Secondary | ICD-10-CM | POA: Diagnosis not present

## 2022-03-04 DIAGNOSIS — F250 Schizoaffective disorder, bipolar type: Secondary | ICD-10-CM | POA: Diagnosis present

## 2022-03-04 NOTE — ED Notes (Signed)
Lunch and beverage provided 

## 2022-03-04 NOTE — ED Notes (Signed)
Breakfast and beverage provided 

## 2022-03-04 NOTE — ED Notes (Signed)
Snack and beverage given. 

## 2022-03-04 NOTE — ED Notes (Signed)
Report received from Andrea, RN including SBAR. Patient alert and oriented, warm and dry, in no acute distress. Patient denies SI, HI, AVH and pain. Patient made aware of Q15 minute rounds and security cameras for their safety. Patient instructed to come to this nurse with needs or concerns.  

## 2022-03-04 NOTE — ED Notes (Signed)
Pt given snack. 

## 2022-03-04 NOTE — ED Notes (Signed)
Vol pending placment 

## 2022-03-04 NOTE — ED Provider Notes (Signed)
Emergency Medicine Observation Re-evaluation Note  Jackson [name] is a 36 y.o. male, seen on rounds today.  Pt initially presented to the ED for complaints of Psychiatric Evaluation  Currently, the patient is calm, no acute complaints.  Physical Exam  Blood pressure 116/75, pulse 69, temperature 97.8 F (36.6 C), temperature source Oral, resp. rate 17, height 6' (1.829 m), weight 113 kg, SpO2 98 %. Physical Exam General: NAD Lungs: CTAB Psych: not agitated  ED Course / MDM  EKG:    I have reviewed the labs performed to date as well as medications administered while in observation.  Recent changes in the last 24 hours include no acute events overnight.    Plan  Current plan is for social work disposition. Patient is not under full IVC at this time.   Sharman Cheek, MD 03/04/22 1114

## 2022-03-04 NOTE — ED Notes (Signed)
Vol /pending placement 

## 2022-03-05 DIAGNOSIS — F250 Schizoaffective disorder, bipolar type: Secondary | ICD-10-CM | POA: Diagnosis not present

## 2022-03-05 MED ORDER — LOPERAMIDE HCL 2 MG PO CAPS: 2.0000 mg | ORAL_CAPSULE | Freq: Three times a day (TID) | ORAL | Status: DC | PRN

## 2022-03-05 NOTE — ED Notes (Signed)
Given lunch tray.

## 2022-03-05 NOTE — ED Notes (Signed)
Patient complaining of diarrhea that has been going on since yesterday. EDP made aware.

## 2022-03-05 NOTE — ED Notes (Signed)
VOL/Pending Placement 

## 2022-03-05 NOTE — TOC Progression Note (Signed)
Transition of Care [facility]) - Progression Note    Patient Details  Name: Jackson [name] MRN: [id number] Date of Birth: [birth year]  Transition of Care [facility]) CM/SW Contact  Allayne Butcher, RN Phone Number: 03/05/2022, 3:09 PM  Clinical Narrative:    Sherron Monday with Albin Felling from [facility] yesterday.  [facility] would like to put together a meeting for Friday to discuss placement.  RNCM secure emailed Carla [email or web address], clinical information.     Expected Discharge Plan: Group Home Barriers to Discharge: ED Unsafe disposition  Expected Discharge Plan and Services Expected Discharge Plan: Group Home   Discharge Planning Services: CM Consult   Living arrangements for the past 2 months: Boarding House                 DME Arranged: N/A DME Agency: NA       HH Arranged: NA HH Agency: NA         Social Determinants of Health (SDOH) Interventions    Readmission Risk Interventions     No data to display

## 2022-03-05 NOTE — ED Notes (Signed)
Pt given breakfast.

## 2022-03-05 NOTE — ED Notes (Signed)
VOL  PENDING  PLACEMENT 

## 2022-03-05 NOTE — ED Notes (Signed)
Pt given dinner tray.

## 2022-03-05 NOTE — ED Provider Notes (Signed)
Emergency Medicine Observation Re-evaluation Note  Jackson [name] is a 36 y.o. male, seen on rounds today.  Pt initially presented to the ED for complaints of Psychiatric Evaluation Currently, the patient is resting w/o complaint.  Physical Exam  BP 118/81 (BP Location: Left Arm)   Pulse 71   Temp 98.2 F (36.8 C) (Oral)   Resp 16   Ht 6' (1.829 m)   Wt 113 kg   SpO2 97%   BMI 33.79 kg/m  Physical Exam General: no acute distress Psych: calm  ED Course / MDM  EKG: I have reviewed the labs performed to date as well as medications administered while in observation.  Recent changes in the last 24 hours include none.  Plan  Current plan is for social work dispo.  Jackson [name] is not under involuntary commitment.     Gilles Chiquito, MD 03/05/22 [phone]

## 2022-03-06 DIAGNOSIS — F250 Schizoaffective disorder, bipolar type: Secondary | ICD-10-CM | POA: Diagnosis not present

## 2022-03-06 NOTE — ED Provider Notes (Signed)
Emergency Medicine Observation Re-evaluation Note  Jackson [name] is a 36 y.o. male, seen on rounds today.  Pt initially presented to the ED for complaints of Psychiatric Evaluation  Currently, the patient is in no distress-Per BHU nurse no issues overnight  Physical Exam  Blood pressure 98/72, pulse 66, temperature 98 F (36.7 C), temperature source Oral, resp. rate 17, height 6' (1.829 m), weight 113 kg, SpO2 98 %.  Physical Exam General: No apparent distress Pulm: Normal WOB   ED Course / MDM     I have reviewed the labs performed to date as well as medications administered while in observation.  Recent changes in the last 24 hours include non3  Plan   Current plan is to continue to wait for placement Patient is not under full IVC at this time.   Concha Se, MD 03/06/22 [phone]

## 2022-03-06 NOTE — ED Notes (Signed)
Hospital meal provided, pt tolerated w/o complaints.  Waste discarded appropriately.  

## 2022-03-06 NOTE — ED Notes (Addendum)
Report received from Wendy, RN including SBAR. Patient alert and oriented, warm and dry, in no acute distress. Patient denies SI, HI, AVH and pain. Patient made aware of Q15 minute rounds and security cameras for their safety. Patient instructed to come to this nurse with needs or concerns.  

## 2022-03-06 NOTE — ED Notes (Signed)
Patient is sitting in the dayroom talking with other Patient's no behavioral issues, no signs of distress, will continue to monitor.

## 2022-03-06 NOTE — ED Notes (Signed)
Patient ate 100% of supper and beverage, no signs of distress.  

## 2022-03-06 NOTE — ED Notes (Signed)
VOL/pending placement 

## 2022-03-06 NOTE — ED Notes (Signed)
Vol /pending placement 

## 2022-03-06 NOTE — ED Notes (Signed)
Patient ate 100 % of lunch and beverage. No signs of distress, will continue to monitor.

## 2022-03-06 NOTE — ED Notes (Signed)
Patient given a snack 

## 2022-03-07 DIAGNOSIS — F250 Schizoaffective disorder, bipolar type: Secondary | ICD-10-CM | POA: Diagnosis not present

## 2022-03-07 NOTE — ED Notes (Signed)
VOL/pending placement 

## 2022-03-07 NOTE — ED Notes (Signed)
Snack and beverage given. 

## 2022-03-07 NOTE — ED Provider Notes (Signed)
Emergency Medicine Observation Re-evaluation Note  Jackson [name] is a 36 y.o. male, seen on rounds today.  Pt initially presented to the ED for complaints of Psychiatric Evaluation  Currently, the patient is resting in bed- no issues overnight per [facility] nurse   Physical Exam  Blood pressure (!) 98/57, pulse 80, temperature 98.3 F (36.8 C), temperature source Oral, resp. rate 18, height 6' (1.829 m), weight 113 kg, SpO2 98 %.  Physical Exam General: No apparent distress CV: RRR Psych: resting      ED Course / MDM     I have reviewed the labs performed to date as well as medications administered while in observation.  Recent changes in the last 24 hours include none  Plan   Current plan is to continue to wait for placement Patient is not under full IVC at this time.   Concha Se, MD 03/07/22 [phone]

## 2022-03-07 NOTE — NC FL2 (Signed)
[place] MEDICAID FL2 LEVEL OF CARE SCREENING TOOL     IDENTIFICATION  Patient Name: Jackson [name] Birthdate: [birth year] Sex: male Admission Date (Current Location): 12/24/2021  [place] and [place] Number:  Chiropodist and Address:  [facility], [address], [place], [place] [address]      Provider Number: [id number]  Attending Physician Name and Address:  No att. providers found  Relative Name and Phone Number:  [name]- mother- Legal Guardian- [phone]    Current Level of Care: Other (Comment) (ED boarder) Recommended Level of Care: Family Care Home, Other (Comment) (group home) Prior Approval Number:    Date Approved/Denied:   PASRR Number:    Discharge Plan: Other (Comment) (Group home)    Current Diagnoses: Patient Active Problem List   Diagnosis Date Noted   Noncompliance 10/18/2018   Schizoaffective disorder, bipolar type (HCC) 10/17/2018    Orientation RESPIRATION BLADDER Height & Weight     Self, Time, Place  Normal Continent Weight: 113 kg Height:  6' (182.9 cm)  BEHAVIORAL SYMPTOMS/MOOD NEUROLOGICAL BOWEL NUTRITION STATUS  Other (Comment) (calm, cooperative)   Continent Diet (Regular)  AMBULATORY STATUS COMMUNICATION OF NEEDS Skin   Independent Verbally Normal                       Personal Care Assistance Level of Assistance              Functional Limitations Info  Sight, Hearing, Speech Sight Info: Adequate Hearing Info: Adequate Speech Info: Adequate    SPECIAL CARE FACTORS FREQUENCY                       Contractures Contractures Info: Not present    Additional Factors Info  Code Status, Allergies, Psychotropic Code Status Info: Full Allergies Info: none Psychotropic Info: Schizoaffective disorder, bipolar type         Current Medications (03/07/2022):  This is the current hospital active medication list Current Facility-Administered Medications  Medication  Dose Route Frequency Provider Last Rate Last Admin   acetaminophen (TYLENOL) tablet 650 mg  650 mg Oral Q6H PRN Concha Se, MD   650 mg at 02/15/22 2308   ARIPiprazole ER (ABILIFY MAINTENA) injection 400 mg  400 mg Intramuscular Q28 days Gabriel Cirri F, NP   400 mg at 02/19/22 1029   benztropine (COGENTIN) tablet 1 mg  1 mg Oral BID Gabriel Cirri F, NP   1 mg at 03/07/22 0937   divalproex (DEPAKOTE) DR tablet 1,000 mg  1,000 mg Oral Q12H Ward, Kristen N, DO   1,000 mg at 03/07/22 0936   guaiFENesin (ROBITUSSIN) 100 MG/5ML liquid 5 mL  5 mL Oral Q6H PRN Willy Eddy, MD   5 mL at 01/10/22 2156   loperamide (IMODIUM) capsule 2 mg  2 mg Oral Q8H PRN Minna Antis, MD       LORazepam (ATIVAN) tablet 2 mg  2 mg Oral Q6H PRN Gabriel Cirri F, NP   2 mg at 02/11/22 1801   OLANZapine zydis (ZYPREXA) disintegrating tablet 15 mg  15 mg Oral BID Ward, Kristen N, DO   15 mg at 03/07/22 2409   traZODone (DESYREL) tablet 25 mg  25 mg Oral QHS Ward, Kristen N, DO   25 mg at 03/06/22 2100   Current Outpatient Medications  Medication Sig Dispense Refill   ARIPiprazole ER (ABILIFY MAINTENA) 400 MG SRER injection Inject 2 mLs (400 mg total) into the  muscle every 28 (twenty-eight) days. (Patient not taking: Reported on 12/24/2021) 1 each 2   divalproex (DEPAKOTE) 500 MG DR tablet Take 2 tablets (1,000 mg total) by mouth every 12 (twelve) hours. (Patient not taking: Reported on 12/24/2021) 120 tablet 2   OLANZapine zydis (ZYPREXA) 15 MG disintegrating tablet Take 1 tablet (15 mg total) by mouth 2 (two) times daily. (Patient not taking: Reported on 12/24/2021) 60 tablet 2   traZODone (DESYREL) 50 MG tablet Take 0.5 tablets (25 mg total) by mouth at bedtime. (Patient not taking: Reported on 12/24/2021) 15 tablet 2     Discharge Medications: Please see discharge summary for a list of discharge medications.  Relevant Imaging Results:  Relevant Lab Results:   Additional Information    Allayne Butcher, RN

## 2022-03-07 NOTE — ED Notes (Signed)
Pt given snack. 

## 2022-03-07 NOTE — TOC Progression Note (Signed)
Transition of Care [facility]) - Progression Note    Patient Details  Name: Jackson [name] MRN: [id number] Date of Birth: [birth year]  Transition of Care [facility]) CM/SW Contact  Allayne Butcher, RN Phone Number: 03/07/2022, 11:47 AM  Clinical Narrative:    Teams meeting this morning with patient's parents, Recardo Evangelist with [facility], Karle Plumber with [facility], Kathlee Nations with the [employer] program with [facility], and [employer] with [facility].  Plan is to try and find patient a group home or maybe group living high possibly with [facility].  Katelyn with [facility] has agreed to complete a CCA addendum.  I will send [facility] an updated FL2.     Expected Discharge Plan: Group Home Barriers to Discharge: ED Unsafe disposition  Expected Discharge Plan and Services Expected Discharge Plan: Group Home   Discharge Planning Services: CM Consult   Living arrangements for the past 2 months: Boarding House                 DME Arranged: N/A DME Agency: NA       HH Arranged: NA HH Agency: NA         Social Determinants of Health (SDOH) Interventions    Readmission Risk Interventions     No data to display

## 2022-03-07 NOTE — ED Notes (Signed)
Report to include Situation, Background, Assessment, and Recommendations received from Antacia RN. Patient alert and oriented, warm and dry, in no acute distress. Patient denies SI, HI, AVH and pain. Patient made aware of Q15 minute rounds and security cameras for their safety. Patient instructed to come to me with needs or concerns.  

## 2022-03-07 NOTE — ED Notes (Signed)
Pt given dinner tray and drink at this time. 

## 2022-03-07 NOTE — ED Notes (Signed)
Pt laying in bed; chest rise and fall noted.

## 2022-03-08 DIAGNOSIS — F250 Schizoaffective disorder, bipolar type: Secondary | ICD-10-CM | POA: Diagnosis not present

## 2022-03-08 NOTE — ED Notes (Signed)
Hospital meal provided, pt tolerated w/o complaints.  Waste discarded appropriately.  

## 2022-03-08 NOTE — ED Notes (Signed)
VOL pending placement most recent note on clipboard 

## 2022-03-08 NOTE — ED Notes (Signed)
Report to include Situation, Background, Assessment, and Recommendations received from Dorothy RN. Patient alert and oriented, warm and dry, in no acute distress. Patient denies SI, HI, AVH and pain. Patient made aware of Q15 minute rounds and security cameras for their safety. Patient instructed to come to me with needs or concerns.  

## 2022-03-08 NOTE — ED Notes (Signed)
pt recieved snack and drink 

## 2022-03-08 NOTE — ED Provider Notes (Signed)
Emergency Medicine Observation Re-evaluation Note  Jackson [name] is a 36 y.o. male, seen on rounds today.  Pt initially presented to the ED for complaints of Psychiatric Evaluation   Physical Exam  BP 116/72 (BP Location: Left Arm)   Pulse 77   Temp 98.1 F (36.7 C) (Oral)   Resp 16   Ht 6' (1.829 m)   Wt 113 kg   SpO2 95%   BMI 33.79 kg/m  Physical Exam General:  NAD  ED Course / MDM  EKG:EKG Interpretation  Date/Time:  Thursday January 30 2022 01:59:27 EDT Ventricular Rate:  78 PR Interval:  190 QRS Duration: 78 QT Interval:  336 QTC Calculation: 383 R Axis:   63 Text Interpretation: Normal sinus rhythm Abnormal QRS-T angle, consider primary T wave abnormality Abnormal ECG When compared with ECG of 13-Feb-2021 16:33, No significant change was found Confirmed by UNCONFIRMED, DOCTOR ([id number]), editor Kearney Hard ([id number]) on 01/30/2022 1:21:03 PM  I have reviewed the labs performed to date as well as medications administered while in observation.  Recent changes in the last 24 hours include none.  Plan  Current plan is for SW.  Jackson [name] is not under involuntary commitment.     Willy Eddy, MD 03/08/22 [phone]

## 2022-03-08 NOTE — ED Notes (Signed)
Unlocked bathroom door to allow patient to shower.  Staff continuously monitored dayroom outside of bathroom door during pt shower.  Pt was given hygiene items as well as:  I clean top, 1 clean bottom, with 1 pair of disposable underwear.  Pt changed out into clean clothing.  Staff disposed of all shower supplies. Shower room cleaned and secured for next use.  

## 2022-03-08 NOTE — ED Notes (Signed)
Pt given dinner tray.

## 2022-03-09 DIAGNOSIS — F250 Schizoaffective disorder, bipolar type: Secondary | ICD-10-CM | POA: Diagnosis not present

## 2022-03-09 NOTE — ED Notes (Signed)
Report to include Situation, Background, Assessment, and Recommendations received from Dorothy RN. Patient alert and oriented, warm and dry, in no acute distress. Patient denies SI, HI, AVH and pain. Patient made aware of Q15 minute rounds and security cameras for their safety. Patient instructed to come to me with needs or concerns.  

## 2022-03-09 NOTE — ED Notes (Signed)
VOL/pending placement 

## 2022-03-09 NOTE — ED Notes (Signed)
Pt given lunch tray and beverage 

## 2022-03-09 NOTE — ED Notes (Signed)
Pt given breakfast tray and beverage.  

## 2022-03-09 NOTE — ED Notes (Signed)
Patient is vol pending placement 

## 2022-03-09 NOTE — ED Provider Notes (Signed)
Emergency Medicine Observation Re-evaluation Note  Jackson [name] is a 36 y.o. male, seen on rounds today.  Pt initially presented to the ED for complaints of Psychiatric Evaluation  Currently, the patient asleep in bed- no issues per [facility] nurse   Physical Exam  Blood pressure 93/65, pulse 78, temperature 97.6 F (36.4 C), temperature source Oral, resp. rate 17, height 6' (1.829 m), weight 113 kg, SpO2 98 %.  Physical Exam General: No apparent distress Pulm: Normal WOB Psych: resting     ED Course / MDM     I have reviewed the labs performed to date as well as medications administered while in observation.   Plan   Current plan is to continue to wait for placement Patient is not under full IVC at this time.   Concha Se, MD 03/09/22 [phone]

## 2022-03-09 NOTE — ED Notes (Signed)
Pt was given dinner tray and beverage

## 2022-03-10 DIAGNOSIS — F250 Schizoaffective disorder, bipolar type: Secondary | ICD-10-CM | POA: Diagnosis not present

## 2022-03-10 LAB — GROUP A STREP BY PCR: Group A Strep by PCR: NOT DETECTED

## 2022-03-10 NOTE — ED Notes (Signed)
Hospital meal provided, pt tolerated w/o complaints.  Waste discarded appropriately.  

## 2022-03-10 NOTE — ED Notes (Signed)

## 2022-03-10 NOTE — ED Notes (Signed)
Dinner tray and beverage provided 

## 2022-03-10 NOTE — ED Notes (Addendum)
Snack and beverage provided; pt sitting in dayroom with other patients and ED tech

## 2022-03-10 NOTE — ED Notes (Signed)
VOL  PENDING  PLACEMENT 

## 2022-03-10 NOTE — ED Notes (Signed)
Pt provided with night time snack and drink 

## 2022-03-10 NOTE — ED Provider Notes (Signed)
Emergency Medicine Observation Re-evaluation Note  Rangel Finnicum is a 36 y.o. male, seen on rounds today.  Pt initially presented to the ED for complaints of Psychiatric Evaluation   Physical Exam  BP 111/75 (BP Location: Right Arm)   Pulse 83   Temp 98.6 F (37 C) (Oral)   Resp 18   Ht 6' (1.829 m)   Wt 113 kg   SpO2 97%   BMI 33.79 kg/m  Physical Exam General: NAd  ED Course / MDM  EKG:EKG Interpretation  Date/Time:  Thursday January 30 2022 01:59:27 EDT Ventricular Rate:  78 PR Interval:  190 QRS Duration: 78 QT Interval:  336 QTC Calculation: 383 R Axis:   63 Text Interpretation: Normal sinus rhythm Abnormal QRS-T angle, consider primary T wave abnormality Abnormal ECG When compared with ECG of 13-Feb-2021 16:33, No significant change was found Confirmed by UNCONFIRMED, DOCTOR ([id number]), editor Kearney Hard ([id number]) on 01/30/2022 1:21:03 PM  I have reviewed the labs performed to date as well as medications administered while in observation.  Recent changes in the last 24 hours include none  Plan  Current plan is for TOC.  Jonavin Kolle is not under involuntary commitment.     Willy Eddy, MD 03/10/22 [phone]

## 2022-03-10 NOTE — ED Provider Notes (Signed)
-----------------------------------------   7:01 AM on 03/10/2022 -----------------------------------------   Patient complained of a sore throat overnight. Oropharynx mildly erythematous, no airway distress.  Rapid strep was negative.   Irean Hong, MD 03/10/22 [phone]

## 2022-03-11 DIAGNOSIS — F250 Schizoaffective disorder, bipolar type: Secondary | ICD-10-CM | POA: Diagnosis not present

## 2022-03-11 NOTE — ED Provider Notes (Signed)
Emergency Medicine Observation Re-evaluation Note  Jackson [name] is a 36 y.o. male, seen in the emergency department for psychiatric concern.  Patient has been cleared by psychiatry and we are currently awaiting placement to a group facility.  No acute events since last update  Physical Exam  BP (!) 90/58 (BP Location: Left Arm)   Pulse 67   Temp 98.2 F (36.8 C) (Oral)   Resp 18   Ht 6' (1.829 m)   Wt 113 kg   SpO2 98%   BMI 33.79 kg/m    ED Course / MDM   Patient had a strep test performed yesterday that was negative otherwise no recent lab work.  Plan  Current plan is for placement to a group facility once available.  Jackson [name] is not under involuntary commitment.     Minna Antis, MD 03/11/22 [phone]

## 2022-03-11 NOTE — ED Notes (Signed)
VOL/Pending Placement 

## 2022-03-11 NOTE — ED Notes (Signed)
Pt given dinner tray and drink 

## 2022-03-11 NOTE — ED Notes (Signed)
Breakfast and beverage provided 

## 2022-03-11 NOTE — ED Notes (Addendum)
Report received from Andrea, RN including SBAR. Patient alert and oriented, warm and dry, in no acute distress. Patient denies SI, HI, AVH and pain. Patient made aware of Q15 minute rounds and security cameras for their safety. Patient instructed to come to this nurse with needs or concerns.  

## 2022-03-11 NOTE — TOC Progression Note (Signed)
Transition of Care [facility]) - Progression Note    Patient Details  Name: Jackson [name] MRN: [id number] Date of Birth: October 11, 1985  Transition of Care [facility]) CM/SW Contact  Allayne Butcher, RN Phone Number: 03/11/2022, 2:51 PM  Clinical Narrative:    Received call from [facility] with [facility], she is currently working on the CCA addendum.  Once addendum is completed referrals can be sent out for group living high.     Expected Discharge Plan: Group Home Barriers to Discharge: ED Unsafe disposition  Expected Discharge Plan and Services Expected Discharge Plan: Group Home   Discharge Planning Services: CM Consult   Living arrangements for the past 2 months: Boarding House                 DME Arranged: N/A DME Agency: NA       HH Arranged: NA HH Agency: NA         Social Determinants of Health (SDOH) Interventions    Readmission Risk Interventions     No data to display

## 2022-03-11 NOTE — ED Notes (Signed)
VOL/pending placement 

## 2022-03-11 NOTE — ED Notes (Signed)
Pt given snack. 

## 2022-03-12 DIAGNOSIS — F250 Schizoaffective disorder, bipolar type: Secondary | ICD-10-CM | POA: Diagnosis not present

## 2022-03-12 NOTE — ED Notes (Signed)
pt recieved snack and drink 

## 2022-03-12 NOTE — ED Notes (Signed)
VOL  PENDING  PLACEMENT 

## 2022-03-12 NOTE — TOC Progression Note (Signed)
Transition of Care [facility]) - Progression Note    Patient Details  Name: Jackson [name] MRN: [id number] Date of Birth: 1985/11/08  Transition of Care [facility]) CM/SW Contact  Allayne Butcher, RN Phone Number: 03/12/2022, 9:34 AM  Clinical Narrative:    [facility] has completed the CCA addendum and returned it to [employer] over at [facility], she will be sending referrals to group living high facilities for placement.    Expected Discharge Plan: Group Home Barriers to Discharge: ED Unsafe disposition  Expected Discharge Plan and Services Expected Discharge Plan: Group Home   Discharge Planning Services: CM Consult   Living arrangements for the past 2 months: Boarding House                 DME Arranged: N/A DME Agency: NA       HH Arranged: NA HH Agency: NA         Social Determinants of Health (SDOH) Interventions    Readmission Risk Interventions     No data to display

## 2022-03-12 NOTE — ED Notes (Signed)
Patient is alert and oriented, he is calm and pleasant, no signs of distress, will continue to monitor.

## 2022-03-12 NOTE — ED Provider Notes (Signed)
Emergency Medicine Observation Re-evaluation Note  Jackson [name] is a 36 y.o. male, seen on rounds today.  Pt initially presented to the ED for complaints of Psychiatric Evaluation  Currently, the patient is sleeping- discussed with night BHU nurse and day time BHU nurse for rounds this AM and no concerns over night.  Physical Exam  Blood pressure 103/68, pulse 78, temperature 98.6 F (37 C), temperature source Oral, resp. rate 16, height 6' (1.829 m), weight 113 kg, SpO2 96 %.  Physical Exam General: No apparent distress Pulm: Normal WOB Psych: resting     ED Course / MDM     I have reviewed the labs performed to date as well as medications administered while in observation.  Plan   Current plan is to continue to wait for placement Patient is not under full IVC at this time.   Concha Se, MD 03/12/22 [phone]

## 2022-03-12 NOTE — ED Notes (Signed)
Pt received dinner. 

## 2022-03-12 NOTE — ED Notes (Signed)
Patient asked for haircut. Writer cut hair and trimmed beard.

## 2022-03-13 DIAGNOSIS — F250 Schizoaffective disorder, bipolar type: Secondary | ICD-10-CM | POA: Diagnosis not present

## 2022-03-13 NOTE — ED Notes (Signed)
Pt given shower supplies and shower unlocked for pt

## 2022-03-13 NOTE — ED Notes (Signed)
Pt up to bathroom, ice water given at pts request.  Pt back to bed

## 2022-03-13 NOTE — ED Notes (Signed)
pt recieved snack and drink 

## 2022-03-13 NOTE — ED Notes (Signed)
VOL/Pending Placement 

## 2022-03-13 NOTE — ED Notes (Signed)
Hospital meal provided, pt tolerated w/o complaints.  Waste discarded appropriately.  

## 2022-03-13 NOTE — ED Provider Notes (Signed)
Emergency Medicine Observation Re-evaluation Note  Ysabel Bovee is a 36 y.o. male, seen on rounds today.  Physical Exam  BP 99/61   Pulse 70   Temp 97.7 F (36.5 C) (Oral)   Resp 16   Ht 6' (1.829 m)   Wt 113 kg   SpO2 96%   BMI 33.79 kg/m  Physical Exam General: Patient in no distress Lungs: Patient not in respiratory distress Psych: Patient not combative  ED Course / MDM  EKG:EKG Interpretation  Date/Time:  Thursday January 30 2022 01:59:27 EDT Ventricular Rate:  78 PR Interval:  190 QRS Duration: 78 QT Interval:  336 QTC Calculation: 383 R Axis:   63 Text Interpretation: Normal sinus rhythm Abnormal QRS-T angle, consider primary T wave abnormality Abnormal ECG When compared with ECG of 13-Feb-2021 16:33, No significant change was found Confirmed by UNCONFIRMED, DOCTOR ([id number]), editor Kearney Hard ([id number]) on 01/30/2022 1:21:03 PM    Plan  Current plan is for placement by social work.  Llewellyn Kubitz is not under involuntary commitment.     Arnaldo Natal, MD 03/13/22 1208

## 2022-03-13 NOTE — ED Notes (Signed)
Vol /pending placement 

## 2022-03-14 DIAGNOSIS — F250 Schizoaffective disorder, bipolar type: Secondary | ICD-10-CM | POA: Diagnosis not present

## 2022-03-14 NOTE — ED Notes (Signed)
VOL / pending TOC placement 

## 2022-03-14 NOTE — ED Notes (Signed)
VOl pending toc placement note on clipboard

## 2022-03-14 NOTE — ED Notes (Signed)
Report to include Situation, Background, Assessment, and Recommendations received from Olivia RN. Patient alert and oriented, warm and dry, in no acute distress. Patient denies SI, HI, AVH and pain. Patient made aware of Q15 minute rounds and security cameras for their safety. Patient instructed to come to me with needs or concerns.  

## 2022-03-14 NOTE — ED Notes (Signed)
Pt changed into clean set of scrubs

## 2022-03-14 NOTE — ED Provider Notes (Signed)
Emergency Medicine Observation Re-evaluation Note  Jackson [name] is a 36 y.o. male, seen on rounds today.   Physical Exam  BP 107/77   Pulse 83   Temp 98.3 F (36.8 C) (Oral)   Resp 17   Ht 6' (1.829 m)   Wt 113 kg   SpO2 93%   BMI 33.79 kg/m  Physical Exam General: Patient resting comfortably in bed Lungs: Patient not in respiratory distress Psych: Patient not combative  ED Course / MDM  EKG:  Plan  Current plan is for placement by social work.  Beni Quam is not under involuntary commitment.     Arnaldo Natal, MD 03/14/22 [phone]

## 2022-03-14 NOTE — ED Notes (Signed)
Pt given nighttime snack. 

## 2022-03-14 NOTE — ED Notes (Signed)
Pt given dinner tray and drink 

## 2022-03-15 DIAGNOSIS — F250 Schizoaffective disorder, bipolar type: Secondary | ICD-10-CM | POA: Diagnosis not present

## 2022-03-15 NOTE — ED Notes (Signed)
Lunch tray given. 

## 2022-03-15 NOTE — ED Notes (Signed)
Dinner tray given to pt

## 2022-03-15 NOTE — ED Notes (Signed)
Report to include Situation, Background, Assessment, and Recommendations received from Georgia RN. Patient alert and oriented, warm and dry, in no acute distress. Patient denies SI, HI, AVH and pain. Patient made aware of Q15 minute rounds and security cameras for their safety. Patient instructed to come to me with needs or concerns.  

## 2022-03-15 NOTE — ED Notes (Addendum)
Hospital meal provided, pt tolerated w/o complaints.  Waste discarded appropriately.  

## 2022-03-15 NOTE — ED Notes (Signed)
Pt received breakfast tray at this time

## 2022-03-16 DIAGNOSIS — F250 Schizoaffective disorder, bipolar type: Secondary | ICD-10-CM | POA: Diagnosis not present

## 2022-03-16 NOTE — ED Notes (Signed)
Pt awake, calm. Breakfast tray given. No complaints, denies any needs. Camera in use monitored by staff and security.

## 2022-03-16 NOTE — ED Notes (Signed)
Snack and beverage given. 

## 2022-03-16 NOTE — ED Notes (Signed)
Report to include Situation, Background, Assessment, and Recommendations received from William RN. Patient alert and oriented, warm and dry, in no acute distress. Patient denies SI, HI, AVH and pain. Patient made aware of Q15 minute rounds and security cameras for their safety. Patient instructed to come to me with needs or concerns.  

## 2022-03-16 NOTE — ED Notes (Addendum)
Wrong pt - blank on purpose

## 2022-03-16 NOTE — ED Notes (Signed)
Pt resting in bed. Camera in use monitored by security. 

## 2022-03-16 NOTE — ED Notes (Signed)
Pt in shower at this time

## 2022-03-16 NOTE — ED Notes (Signed)
VOL/pending placement 

## 2022-03-16 NOTE — ED Notes (Signed)
Pt resting in bed, calm. Camera in use monitored by security. 

## 2022-03-16 NOTE — ED Notes (Signed)
Pt resting in bed, calm. NAD. Camera in use monitored by security. 

## 2022-03-16 NOTE — ED Notes (Signed)
Pt asleep, awaked for lunch. Pt calm. Denies any needs or concerns. Camera in use monitored by security.

## 2022-03-16 NOTE — ED Notes (Signed)
Pt oob in day room, calm, cooperative. Camera in use monitored by security.

## 2022-03-16 NOTE — ED Notes (Signed)
Patient is vol pending placement 

## 2022-03-16 NOTE — ED Notes (Signed)
Pt in room awake, calm, resting. Denies any needs. Camera in use monitored by security.

## 2022-03-16 NOTE — ED Notes (Signed)
Pt resting in bed. NAD. Camera in use monitored by security. 

## 2022-03-16 NOTE — ED Provider Notes (Signed)
Emergency Medicine Observation Re-evaluation Note  Rylen Mott is a 36 y.o. male, seen on rounds today.  Pt initially presented to the ED for complaints of Psychiatric Evaluation   Physical Exam  BP 119/69 (BP Location: Right Arm)   Pulse 70   Temp 97.9 F (36.6 C) (Oral)   Resp 18   Ht 6' (1.829 m)   Wt 113 kg   SpO2 96%   BMI 33.79 kg/m  Physical Exam General: Resting no distress Lungs: No increased work of breathing Psych: Calm no agitation  ED Course / MDM  EKG:EKG Interpretation  Date/Time:  Thursday January 30 2022 01:59:27 EDT Ventricular Rate:  78 PR Interval:  190 QRS Duration: 78 QT Interval:  336 QTC Calculation: 383 R Axis:   63 Text Interpretation: Normal sinus rhythm Abnormal QRS-T angle, consider primary T wave abnormality Abnormal ECG When compared with ECG of 13-Feb-2021 16:33, No significant change was found Confirmed by UNCONFIRMED, DOCTOR ([id number]), editor Kearney Hard ([id number]) on 01/30/2022 1:21:03 PM  I have reviewed the labs performed to date as well as medications administered while in observation.  Recent changes in the last 24 hours include no changes.  Plan  Current plan is for social work disposition.  Lydon Christoph is not under involuntary commitment.     Georga Hacking, MD 03/16/22 [phone]

## 2022-03-16 NOTE — ED Notes (Signed)
Pt oob in dayroom. Calm cooperative. NAD. Camera in use monitored by security.

## 2022-03-17 DIAGNOSIS — F250 Schizoaffective disorder, bipolar type: Secondary | ICD-10-CM | POA: Diagnosis not present

## 2022-03-17 NOTE — ED Notes (Signed)
Breakfast and beverage provided 

## 2022-03-17 NOTE — ED Notes (Signed)
Report received from Andrea, RN including SBAR. Patient alert and oriented, warm and dry, in no acute distress. Patient denies SI, HI, AVH and pain. Patient made aware of Q15 minute rounds and security cameras for their safety. Patient instructed to come to this nurse with needs or concerns.  

## 2022-03-17 NOTE — ED Notes (Signed)
VOL  PENDING  PLACEMENT 

## 2022-03-17 NOTE — TOC Progression Note (Addendum)
Transition of Care [facility]) - Progression Note    Patient Details  Name: Jackson [name] MRN: [id number] Date of Birth: Feb 02, 1986  [facility]) CM/SW Contact  Gildardo Griffes, [place] Phone Number: 03/17/2022, 10:26 AM  Clinical Narrative:      Update: Carla responded to email reporting : I am still waiting for two providers to give me an answer. I have two more places to submit his information to that may have an opening.  CSW has sent email to [facility] with [facility] at [email or web address] to request updates on referrals that were sent for patient's placement. Pending response at this time.    Expected Discharge Plan: Group Home Barriers to Discharge: ED Unsafe disposition  Expected Discharge Plan and Services Expected Discharge Plan: Group Home   Discharge Planning Services: CM Consult   Living arrangements for the past 2 months: Boarding House                 DME Arranged: N/A DME Agency: NA       HH Arranged: NA HH Agency: NA         Social Determinants of Health (SDOH) Interventions    Readmission Risk Interventions     No data to display

## 2022-03-17 NOTE — ED Provider Notes (Signed)
Emergency Medicine Observation Re-evaluation Note  Dempsey Ybarra is a 36 y.o. male, seen on rounds today.  Pt initially presented to the ED for complaints of Psychiatric Evaluation  Currently, the patient is calm, no acute complaints.  Physical Exam  Blood pressure 100/64, pulse 89, temperature 98.5 F (36.9 C), temperature source Oral, resp. rate 18, height 6' (1.829 m), weight 113 kg, SpO2 95 %. Physical Exam General: NAD Lungs: CTAB Psych: not agitated  ED Course / MDM  EKG:    I have reviewed the labs performed to date as well as medications administered while in observation.  Recent changes in the last 24 hours include no acute events overnight.    Plan  Current plan is for social work disposition. Patient is not under full IVC at this time.   Sharman Cheek, MD 03/17/22 [phone]

## 2022-03-17 NOTE — ED Notes (Signed)
Pt up to nurses station door to say good morning. Smiling and pleasant.

## 2022-03-17 NOTE — ED Notes (Signed)
Pt given nighttime snack. 

## 2022-03-17 NOTE — ED Notes (Signed)
Lunch and beverage provided 

## 2022-03-17 NOTE — ED Notes (Addendum)
Snack and beverage provided  

## 2022-03-18 DIAGNOSIS — F250 Schizoaffective disorder, bipolar type: Secondary | ICD-10-CM | POA: Diagnosis not present

## 2022-03-18 NOTE — ED Notes (Signed)
Hospital meal provided, pt tolerated w/o complaints.  Waste discarded appropriately.  

## 2022-03-18 NOTE — ED Notes (Signed)
Vol /pending placement 

## 2022-03-18 NOTE — ED Notes (Signed)
Shower was offered and refused for today.

## 2022-03-18 NOTE — ED Notes (Signed)
VOL/Pending Placement 

## 2022-03-18 NOTE — ED Notes (Signed)
Dinner and beverage provided 

## 2022-03-18 NOTE — ED Notes (Signed)
Snack and beverage provided  

## 2022-03-18 NOTE — ED Notes (Signed)
Pt up to restroom. Lunch tray provided with beverage.

## 2022-03-19 DIAGNOSIS — F250 Schizoaffective disorder, bipolar type: Secondary | ICD-10-CM | POA: Diagnosis not present

## 2022-03-19 NOTE — ED Notes (Signed)
Patient sitting in dayroom, He is without behavioral issues, he is calm and cooperative, will continue to monitor.

## 2022-03-19 NOTE — ED Notes (Signed)
Pt given snack. 

## 2022-03-19 NOTE — ED Notes (Signed)
Patient ate lunch, did let nurse administer ability injection without any behavioral issues, Patient is safe, denies Si/hi or avh, Patient wants to sleep mostly, no interacting as of yet with other Patients, will continue to monitor.

## 2022-03-19 NOTE — TOC Progression Note (Signed)
Transition of Care [facility]) - Progression Note    Patient Details  Name: Jackson [name] MRN: [id number] Date of Birth: January 16, 1986  [facility]) CM/SW Contact  Allayne Butcher, RN Phone Number: 03/19/2022, 1:29 PM  Clinical Narrative:    Received a call from Jackson [name] from [facility].  He would like to see updated nursing and MD notes for the patient.  He would also like to do a virtual assessment tomorrow at 1pm.  RN and MD progress notes from the ED secure emailed to [email or web address].  Reached out to [employer] with [facility] to see if she could send out a teams meeting invite for virtual assessment tomorrow at 1 pm and include the patient's parents.   Jackson [name] [phone].   Expected Discharge Plan: Group Home Barriers to Discharge: ED Unsafe disposition  Expected Discharge Plan and Services Expected Discharge Plan: Group Home   Discharge Planning Services: CM Consult   Living arrangements for the past 2 months: Boarding House                 DME Arranged: N/A DME Agency: NA       HH Arranged: NA HH Agency: NA         Social Determinants of Health (SDOH) Interventions    Readmission Risk Interventions     No data to display

## 2022-03-19 NOTE — ED Notes (Signed)
Report received from Wendy, RN including SBAR. Patient alert and oriented, warm and dry, in no acute distress. Patient denies SI, HI, AVH and pain. Patient made aware of Q15 minute rounds and security cameras for their safety. Patient instructed to come to this nurse with needs or concerns.  

## 2022-03-19 NOTE — ED Notes (Signed)
VOL pending placement note on clipboard 

## 2022-03-19 NOTE — ED Provider Notes (Signed)
Emergency Medicine Observation Re-evaluation Note  Jackson [name] is a 36 y.o. male, seen on rounds today.  Pt initially presented to the ED for complaints of Psychiatric Evaluation Currently, the patient is seen resting comfortably with no complaints  Physical Exam  BP 116/78 (BP Location: Right Arm)   Pulse 73   Temp 97.9 F (36.6 C) (Oral)   Resp 20   Ht 6' (1.829 m)   Wt 113 kg   SpO2 98%   BMI 33.79 kg/m  Physical Exam Gen; Sleeping comfortably Resp; unlabored breathing.  Psych; no agitation  ED Course / MDM  EKG:EKG Interpretation  Date/Time:  Thursday January 30 2022 01:59:27 EDT Ventricular Rate:  78 PR Interval:  190 QRS Duration: 78 QT Interval:  336 QTC Calculation: 383 R Axis:   63 Text Interpretation: Normal sinus rhythm Abnormal QRS-T angle, consider primary T wave abnormality Abnormal ECG When compared with ECG of 13-Feb-2021 16:33, No significant change was found Confirmed by UNCONFIRMED, DOCTOR ([id number]), editor Kearney Hard ([id number]) on 01/30/2022 1:21:03 PM  I have reviewed the labs performed to date as well as medications administered while in observation.  Recent changes in the last 24 hours include no events reported this morning.  Plan  Current plan is for SW dispo  Jackson [name] is not under involuntary commitment.     Pilar Jarvis, MD 03/19/22 [phone]

## 2022-03-19 NOTE — NC FL2 (Signed)
[place] MEDICAID FL2 LEVEL OF CARE SCREENING TOOL     IDENTIFICATION  Patient Name: Jackson [name] Birthdate: Apr 13, 1986 Sex: male Admission Date (Current Location): 12/24/2021  [place] Number:  Chiropodist and Address:  [facility], [address] [place], [place] [address]      Provider Number: [id number]  Attending Physician Name and Address:  No att. providers found  Relative Name and Phone Number:  [name]- mother- Legal Guardian- [phone]    Current Level of Care: Other (Comment) (ED boarder) Recommended Level of Care: Family Care Home, Other (Comment) (group home) Prior Approval Number:    Date Approved/Denied:   PASRR Number:    Discharge Plan: Other (Comment) (Group home)    Current Diagnoses: Patient Active Problem List   Diagnosis Date Noted   Noncompliance 10/18/2018   Schizoaffective disorder, bipolar type (HCC) 10/17/2018    Orientation RESPIRATION BLADDER Height & Weight     Situation, Self, Time, Place  Normal Continent Weight: 113 kg Height:  6' (182.9 cm)  BEHAVIORAL SYMPTOMS/MOOD NEUROLOGICAL BOWEL NUTRITION STATUS  Other (Comment) (calm, cooperative)   Continent Diet (Regular)  AMBULATORY STATUS COMMUNICATION OF NEEDS Skin   Independent Verbally Normal                       Personal Care Assistance Level of Assistance              Functional Limitations Info  Sight, Hearing, Speech Sight Info: Adequate Hearing Info: Adequate Speech Info: Adequate    SPECIAL CARE FACTORS FREQUENCY                       Contractures Contractures Info: Not present    Additional Factors Info  Code Status, Allergies, Psychotropic Code Status Info: Full Allergies Info: none Psychotropic Info: Schizoaffective disorder, bipolar type         Current Medications (03/19/2022):  This is the current hospital active medication list Current Facility-Administered Medications   Medication Dose Route Frequency Provider Last Rate Last Admin   acetaminophen (TYLENOL) tablet 650 mg  650 mg Oral Q6H PRN Concha Se, MD   650 mg at 03/13/22 1650   ARIPiprazole ER (ABILIFY MAINTENA) injection 400 mg  400 mg Intramuscular Q28 days Gabriel Cirri F, NP   400 mg at 03/19/22 1217   benztropine (COGENTIN) tablet 1 mg  1 mg Oral BID Gabriel Cirri F, NP   1 mg at 03/19/22 0926   divalproex (DEPAKOTE) DR tablet 1,000 mg  1,000 mg Oral Q12H Ward, Kristen N, DO   1,000 mg at 03/19/22 0927   guaiFENesin (ROBITUSSIN) 100 MG/5ML liquid 5 mL  5 mL Oral Q6H PRN Willy Eddy, MD   5 mL at 01/10/22 2156   loperamide (IMODIUM) capsule 2 mg  2 mg Oral Q8H PRN Minna Antis, MD       LORazepam (ATIVAN) tablet 2 mg  2 mg Oral Q6H PRN Gabriel Cirri F, NP   2 mg at 03/09/22 2109   OLANZapine zydis (ZYPREXA) disintegrating tablet 15 mg  15 mg Oral BID Ward, Kristen N, DO   15 mg at 03/19/22 0931   traZODone (DESYREL) tablet 25 mg  25 mg Oral QHS Ward, Kristen N, DO   25 mg at 03/18/22 2140   Current Outpatient Medications  Medication Sig Dispense Refill   ARIPiprazole ER (ABILIFY MAINTENA) 400 MG SRER injection Inject 2 mLs (400 mg total) into  the muscle every 28 (twenty-eight) days. (Patient not taking: Reported on 12/24/2021) 1 each 2   divalproex (DEPAKOTE) 500 MG DR tablet Take 2 tablets (1,000 mg total) by mouth every 12 (twelve) hours. (Patient not taking: Reported on 12/24/2021) 120 tablet 2   OLANZapine zydis (ZYPREXA) 15 MG disintegrating tablet Take 1 tablet (15 mg total) by mouth 2 (two) times daily. (Patient not taking: Reported on 12/24/2021) 60 tablet 2   traZODone (DESYREL) 50 MG tablet Take 0.5 tablets (25 mg total) by mouth at bedtime. (Patient not taking: Reported on 12/24/2021) 15 tablet 2     Discharge Medications: Please see discharge summary for a list of discharge medications.  Relevant Imaging Results:  Relevant Lab Results:   Additional Information     Allayne Butcher, RN

## 2022-03-20 DIAGNOSIS — F250 Schizoaffective disorder, bipolar type: Secondary | ICD-10-CM | POA: Diagnosis not present

## 2022-03-20 NOTE — ED Notes (Signed)
VOL/pending placement 

## 2022-03-20 NOTE — ED Notes (Signed)
This nurse has spent extensive time with pt discussing meeting today, plan from ED, history, and sorting through delusional thoughts. Pt is adamant that he is not going to a group home because he is too intelligent to take part in adult day care. Pt questions this nurse on if he is sane, has correct diagnosis and why people are ount to get him. Nurse discusses prior visits and situations, pt continues to express historical delusional thoughts and rationales for it.

## 2022-03-20 NOTE — ED Notes (Signed)
Pt received lunch tray 

## 2022-03-20 NOTE — ED Notes (Signed)
Snack and beverage provided  

## 2022-03-20 NOTE — ED Notes (Signed)
Pt given dinner tray and drink at this time. 

## 2022-03-20 NOTE — ED Notes (Signed)
Report received from Ariel, RN including SBAR. Patient alert and oriented, warm and dry, in no acute distress. Patient denies SI, HI, AVH and pain. Patient made aware of Q15 minute rounds and security cameras for their safety. Patient instructed to come to this nurse with needs or concerns.  

## 2022-03-20 NOTE — TOC Progression Note (Signed)
Transition of Care [facility]) - Progression Note    Patient Details  Name: Kathy Kervin MRN: [id number] Date of Birth: [birth year]  [facility]) CM/SW Contact  Shelbie Hutching, RN Phone Number: 03/20/2022, 4:28 PM  Clinical Narrative:    Met with patient for a long time this afternoon, virtual meeting with group home [place] lasted for an hour and included patient's mother and [facility] team.  Patient does not feel that he is mentally ill and therefore does not know why he needs to go to a group home.  He stresses multiple times that he would rather live here than go to a group home and be forced to go to a day program like a child. Doubtful that the group home will make an offer with the patient being so against going. TOC will follow up with [facility] and patient and guardian.     Expected Discharge Plan: Group Home Barriers to Discharge: ED Unsafe disposition  Expected Discharge Plan and Services Expected Discharge Plan: Group Home   Discharge Planning Services: CM Consult   Living arrangements for the past 2 months: Boarding House                 DME Arranged: N/A DME Agency: NA       HH Arranged: NA HH Agency: NA         Social Determinants of Health (SDOH) Interventions    Readmission Risk Interventions     No data to display

## 2022-03-20 NOTE — ED Notes (Signed)
Pt given snack. 

## 2022-03-21 DIAGNOSIS — F250 Schizoaffective disorder, bipolar type: Secondary | ICD-10-CM | POA: Diagnosis not present

## 2022-03-21 NOTE — ED Notes (Signed)
Hospital meal provided, pt tolerated w/o complaints.  Waste discarded appropriately.  

## 2022-03-21 NOTE — ED Notes (Signed)
Vol pending placement see note on clipboard 

## 2022-03-21 NOTE — ED Notes (Signed)
Snack and beverage given. 

## 2022-03-21 NOTE — ED Provider Notes (Signed)
Emergency Medicine Observation Re-evaluation Note  Jackson [name] is a 36 y.o. male, seen on rounds today.  Pt initially presented to the ED for complaints of Psychiatric Evaluation Currently, the patient is sleeping comfortably, denies complaints when woken.  Physical Exam  BP 113/79 (BP Location: Left Arm)   Pulse 68   Temp 97.9 F (36.6 C) (Oral)   Resp 17   Ht 6' (1.829 m)   Wt 113 kg   SpO2 100%   BMI 33.79 kg/m  Physical Exam Constitutional: Resting comfortably. Eyes: Conjunctivae are normal. Head: Atraumatic. Nose: No congestion/rhinnorhea. Mouth/Throat: Mucous membranes are moist. Neck: Normal ROM Cardiovascular: No cyanosis noted. Respiratory: Normal respiratory effort. Gastrointestinal: Non-distended. Genitourinary: deferred Musculoskeletal: No lower extremity tenderness nor edema. Neurologic:  Normal speech and language. No gross focal neurologic deficits are appreciated. Skin:  Skin is warm, dry and intact. No rash noted.   ED Course / MDM  EKG:EKG Interpretation  Date/Time:  Thursday January 30 2022 01:59:27 EDT Ventricular Rate:  78 PR Interval:  190 QRS Duration: 78 QT Interval:  336 QTC Calculation: 383 R Axis:   63 Text Interpretation: Normal sinus rhythm Abnormal QRS-T angle, consider primary T wave abnormality Abnormal ECG When compared with ECG of 13-Feb-2021 16:33, No significant change was found Confirmed by UNCONFIRMED, DOCTOR ([id number]), editor Kearney Hard ([id number]) on 01/30/2022 1:21:03 PM  I have reviewed the labs performed to date as well as medications administered while in observation.  Recent changes in the last 24 hours include none.  Plan  Current plan is for dispo per social work.  Reynolds Shek is not under involuntary commitment.     Chesley Noon, MD 03/21/22 1116

## 2022-03-21 NOTE — ED Notes (Signed)
Report to include Situation, Background, Assessment, and Recommendations received from Amber RN. Patient alert and oriented, warm and dry, in no acute distress. Patient denies SI, HI, AVH and pain. Patient made aware of Q15 minute rounds and security cameras for their safety. Patient instructed to come to me with needs or concerns.  

## 2022-03-21 NOTE — ED Notes (Signed)
Social Worker at bedside.

## 2022-03-21 NOTE — ED Notes (Signed)
pt recieved snack and drink 

## 2022-03-21 NOTE — ED Notes (Signed)
Pt given shower supplies and new clothing. Offered new lines pt denied at this time.

## 2022-03-21 NOTE — ED Notes (Signed)
Pt received dinner. 

## 2022-03-21 NOTE — ED Notes (Signed)
Vol /pending placement 

## 2022-03-22 DIAGNOSIS — F250 Schizoaffective disorder, bipolar type: Secondary | ICD-10-CM | POA: Diagnosis not present

## 2022-03-22 NOTE — ED Notes (Signed)
PT given breakfast tray and beverage.

## 2022-03-22 NOTE — ED Notes (Signed)
Pt given dinner tray and beverage  

## 2022-03-22 NOTE — ED Provider Notes (Signed)
Emergency Medicine Observation Re-evaluation Note  Lysle Kanitz is a 36 y.o. male, seen on rounds today.  Pt initially presented to the ED for complaints of Psychiatric Evaluation Currently, the patient is calm, resting.  Physical Exam  BP 114/74   Pulse 70   Temp 97.8 F (36.6 C)   Resp 18   Ht 6' (1.829 m)   Wt 113 kg   SpO2 99%   BMI 33.79 kg/m    ED Course / MDM  EKG:EKG Interpretation  Date/Time:  Thursday January 30 2022 01:59:27 EDT Ventricular Rate:  78 PR Interval:  190 QRS Duration: 78 QT Interval:  336 QTC Calculation: 383 R Axis:   63 Text Interpretation: Normal sinus rhythm Abnormal QRS-T angle, consider primary T wave abnormality Abnormal ECG When compared with ECG of 13-Feb-2021 16:33, No significant change was found Confirmed by UNCONFIRMED, DOCTOR ([id number]), editor Kearney Hard ([id number]) on 01/30/2022 1:21:03 PM  I have reviewed the labs performed to date as well as medications administered while in observation.  Recent changes in the last 24 hours include none.  Plan  Current plan is for sw disposition.  Jermon Arata is not under involuntary commitment.     [name], MD 03/22/22 1059

## 2022-03-22 NOTE — ED Notes (Signed)
Report to include Situation, Background, Assessment, and Recommendations received from Ariel RN. Patient alert and oriented, warm and dry, in no acute distress. Patient denies SI, HI, AVH and pain. Patient made aware of Q15 minute rounds and security cameras for their safety. Patient instructed to come to me with needs or concerns.  

## 2022-03-22 NOTE — ED Notes (Signed)
Snack and beverage given. 

## 2022-03-23 DIAGNOSIS — F250 Schizoaffective disorder, bipolar type: Secondary | ICD-10-CM | POA: Diagnosis not present

## 2022-03-23 MED ORDER — BENZOCAINE 10 % MT GEL
Freq: Two times a day (BID) | OROMUCOSAL | Status: AC | PRN
  Administered 2022-03-27 – 2022-03-29 (×3): 1 via OROMUCOSAL
  Filled 2022-03-23: qty 9

## 2022-03-23 NOTE — ED Notes (Signed)
Hospital meal provided, pt tolerated w/o complaints.  Waste discarded appropriately.  

## 2022-03-23 NOTE — ED Notes (Signed)
Report to include Situation, Background, Assessment, and Recommendations received from Kelly RN. Patient alert and oriented, warm and dry, in no acute distress. Patient denies SI, HI, AVH and pain. Patient made aware of Q15 minute rounds and security cameras for their safety. Patient instructed to come to me with needs or concerns.  

## 2022-03-23 NOTE — ED Notes (Signed)
Patient is vol pending placement 

## 2022-03-23 NOTE — ED Notes (Signed)
Pt given meal tray and drink.

## 2022-03-23 NOTE — ED Notes (Signed)
Pt's room cleaned and given dinner tray

## 2022-03-23 NOTE — ED Notes (Signed)
Snack and beverage given. 

## 2022-03-23 NOTE — ED Notes (Signed)
VOL/pending placement 

## 2022-03-24 DIAGNOSIS — F250 Schizoaffective disorder, bipolar type: Secondary | ICD-10-CM | POA: Diagnosis not present

## 2022-03-24 NOTE — ED Notes (Signed)
Vol /pending placement 

## 2022-03-24 NOTE — ED Notes (Signed)
Lunch provided.

## 2022-03-24 NOTE — ED Notes (Signed)
Meal given

## 2022-03-24 NOTE — ED Notes (Signed)
Pt offered shower. Encouraged to clean room.

## 2022-03-24 NOTE — ED Provider Notes (Signed)
Emergency Medicine Observation Re-evaluation Note  Afshin Sugrue is a 36 y.o. male, seen on rounds today.  Pt initially presented to the ED for complaints of Psychiatric Evaluation Currently, the patient is resting comfortably.  Physical Exam  BP 110/62 (BP Location: Right Arm)   Pulse 82   Temp 98.2 F (36.8 C) (Oral)   Resp 18   Ht 6' (1.829 m)   Wt 113 kg   SpO2 99%   BMI 33.79 kg/m  Physical Exam General: Sleeping, no acute distress Lungs: Even and unlabored respirations Psych: No agitation  ED Course / MDM  EKG:EKG Interpretation  Date/Time:  Thursday January 30 2022 01:59:27 EDT Ventricular Rate:  78 PR Interval:  190 QRS Duration: 78 QT Interval:  336 QTC Calculation: 383 R Axis:   63 Text Interpretation: Normal sinus rhythm Abnormal QRS-T angle, consider primary T wave abnormality Abnormal ECG When compared with ECG of 13-Feb-2021 16:33, No significant change was found Confirmed by UNCONFIRMED, DOCTOR ([id number]), editor Kearney Hard ([id number]) on 01/30/2022 1:21:03 PM  I have reviewed the labs performed to date as well as medications administered while in observation.  Recent changes in the last 24 hours include no reported events morning.  Plan  Current plan is for sw disposition.  Pryce Zelada is not under involuntary commitment.     Pilar Jarvis, MD 03/24/22 [phone]

## 2022-03-24 NOTE — ED Notes (Signed)
Hospital meal provided, pt tolerated w/o complaints.  Waste discarded appropriately.  

## 2022-03-24 NOTE — TOC Progression Note (Signed)
Transition of Care [facility]) - Progression Note    Patient Details  Name: Jackson [name] MRN: [id number] Date of Birth: 1985/09/23  [facility]) CM/SW Contact  Allayne Butcher, RN Phone Number: 03/24/2022, 3:05 PM  Clinical Narrative:    [facility] services called and officially declined to offer a group home bed saying patient not ready for group home living.  [facility] have arranged for another screening meeting on Wed 8/23 at 12pm.    Expected Discharge Plan: Group Home Barriers to Discharge: ED Unsafe disposition  Expected Discharge Plan and Services Expected Discharge Plan: Group Home   Discharge Planning Services: CM Consult   Living arrangements for the past 2 months: Boarding House                 DME Arranged: N/A DME Agency: NA       HH Arranged: NA HH Agency: NA         Social Determinants of Health (SDOH) Interventions    Readmission Risk Interventions     No data to display

## 2022-03-24 NOTE — ED Notes (Signed)
Snack provided

## 2022-03-25 DIAGNOSIS — F250 Schizoaffective disorder, bipolar type: Secondary | ICD-10-CM | POA: Diagnosis not present

## 2022-03-25 NOTE — ED Notes (Signed)
Snack provided at this time

## 2022-03-25 NOTE — ED Notes (Signed)
Dinner tray given to PT. 

## 2022-03-25 NOTE — ED Notes (Signed)
Snack given to pt.

## 2022-03-25 NOTE — ED Provider Notes (Signed)
Emergency Medicine Observation Re-evaluation Note  Jackson [name] is a 36 y.o. male, seen on rounds today.  Physical Exam  BP (!) 148/98 (BP Location: Left Arm)   Pulse 93   Temp 98.1 F (36.7 C) (Oral)   Resp 16   Ht 6' (1.829 m)   Wt 113 kg   SpO2 97%   BMI 33.79 kg/m  Physical Exam General: Patient resting comfortably in bed Lungs: Patient not in respiratory distress Psych: Patient not combative  ED Course / MDM  EKG:EKG Interpretation  Date/Time:  Thursday January 30 2022 01:59:27 EDT Ventricular Rate:  78 PR Interval:  190 QRS Duration: 78 QT Interval:  336 QTC Calculation: 383 R Axis:   63 Text Interpretation: Normal sinus rhythm Abnormal QRS-T angle, consider primary T wave abnormality Abnormal ECG When compared with ECG of 13-Feb-2021 16:33, No significant change was found Confirmed by UNCONFIRMED, DOCTOR ([id number]), editor Kearney Hard ([id number]) on 01/30/2022 1:21:03 PM    Plan  Current plan is for social work placement.  [name] is not under involuntary commitment.     Arnaldo Natal, MD 03/25/22 [phone]

## 2022-03-25 NOTE — ED Notes (Signed)
Report received from Andrea, RN including SBAR. Patient alert and oriented, warm and dry, in no acute distress. Patient denies SI, HI, AVH and pain. Patient made aware of Q15 minute rounds and security cameras for their safety. Patient instructed to come to this nurse with needs or concerns.  

## 2022-03-25 NOTE — ED Notes (Signed)
Vol /pending placement 

## 2022-03-26 DIAGNOSIS — F250 Schizoaffective disorder, bipolar type: Secondary | ICD-10-CM | POA: Diagnosis not present

## 2022-03-26 MED ORDER — IBUPROFEN 600 MG PO TABS
600.0000 mg | ORAL_TABLET | Freq: Once | ORAL | Status: AC
  Administered 2022-03-26: 600 mg via ORAL

## 2022-03-26 NOTE — ED Notes (Signed)
Patient complaining of toothache, administered tylenol po. Will continue to monitor.

## 2022-03-26 NOTE — ED Notes (Signed)
Patient ate supper and had beverage.  

## 2022-03-26 NOTE — TOC Progression Note (Signed)
Transition of Care [facility]) - Progression Note    Patient Details  Name: Jackson [name] MRN: [id number] Date of Birth: [birth year]  Transition [facility]) CM/SW Contact  Allayne Butcher, RN Phone Number: 03/26/2022, 6:11 PM  Clinical Narrative:    Interview completed today with [facility], [facility].  Interview and assessment seemed to go well, patient liked the home and said that he would like to go there.  If they make and offer patient will be placed on their waitlist, 2nd in line for a bed.    Expected Discharge Plan: Group Home Barriers to Discharge: ED Unsafe disposition  Expected Discharge Plan and Services Expected Discharge Plan: Group Home   Discharge Planning Services: CM Consult   Living arrangements for the past 2 months: Boarding House                 DME Arranged: N/A DME Agency: NA       HH Arranged: NA HH Agency: NA         Social Determinants of Health (SDOH) Interventions    Readmission Risk Interventions     No data to display

## 2022-03-26 NOTE — ED Provider Notes (Signed)
Emergency Medicine Observation Re-evaluation Note  Jackson [name] is a 36 y.o. male, seen on rounds today.  Pt initially presented to the ED for complaints of Psychiatric Evaluation Currently, the patient resting in his room.  He reports bilateral dental pain to his lower molars that he states has been present since he was initially brought in.  Physical Exam  BP (!) 99/56   Pulse 76   Temp 98.1 F (36.7 C)   Resp 16   Ht 6' (1.829 m)   Wt 113 kg   SpO2 97%   BMI 33.79 kg/m  Physical Exam General: Calm and cooperative HEENT: Oropharynx clear.  Bilateral molars appear normal.  No gingival or buccal swelling, erythema, or drainage. Cardiac: Well perfused Lungs: Normal respiratory effort Psych: Calm and cooperative  ED Course / MDM   I have reviewed the labs performed to date as well as medications administered while in observation.  There have been no significant changes to his status in the last 24 hours.  Plan  Current plan is for placement.  Miriam Nedrow is not under involuntary commitment.     Dionne Bucy, MD 03/26/22 [phone]

## 2022-03-26 NOTE — ED Notes (Signed)
Pt continues to complain of worsening tooth pain. Tooth on bottom left side of mouth. No relief so far from intervention earlier. Secure chat sent to Dr. York Cerise at this time as pt is requesting further assistance

## 2022-03-26 NOTE — ED Notes (Signed)
VOL pending placement note on clipboard 

## 2022-03-26 NOTE — ED Notes (Signed)
VOL/Pending Placement 

## 2022-03-26 NOTE — ED Notes (Signed)
Patient took po medications, had breakfast, He is calm and pleasant, states " I'm getting fat" Patient denies Si/hi or avh, camera surveillance in progress and q 15 minute checks in progress for safety.

## 2022-03-27 DIAGNOSIS — F250 Schizoaffective disorder, bipolar type: Secondary | ICD-10-CM | POA: Diagnosis not present

## 2022-03-27 NOTE — ED Notes (Signed)
VOL  PENDING  PLACEMENT 

## 2022-03-27 NOTE — ED Notes (Signed)
Patient in shower at this time. 

## 2022-03-27 NOTE — ED Notes (Signed)
Pt orajel tube almost empty, chat sent to pharmacy for replacement tube

## 2022-03-27 NOTE — ED Notes (Signed)
Pt recieved lunch tray

## 2022-03-27 NOTE — ED Provider Notes (Signed)
Emergency Medicine Observation Re-evaluation Note  Jackson [name] is a 36 y.o. male, seen on rounds today.   Physical Exam  BP 109/77   Pulse 75   Temp 98.3 F (36.8 C) (Oral)   Resp 16   Ht 6' (1.829 m)   Wt 113 kg   SpO2 96%   BMI 33.79 kg/m  Physical Exam General: Patient resting comfortably in bed Lungs: Patient in no respiratory distress Psych: Patient is not combative  ED Course / MDM  EKG:  Plan  Current plan is for social work placement.  Jackson [name] is not under involuntary commitment.     Arnaldo Natal, MD 03/27/22 [phone]

## 2022-03-28 DIAGNOSIS — F250 Schizoaffective disorder, bipolar type: Secondary | ICD-10-CM | POA: Diagnosis not present

## 2022-03-28 NOTE — ED Notes (Signed)
Pt given dinner tray and drink at this time. 

## 2022-03-28 NOTE — ED Notes (Signed)
Patient is complaining of tooth ache. Tylenol PRN given.

## 2022-03-28 NOTE — ED Notes (Signed)
Snack and beverage given. 

## 2022-03-28 NOTE — ED Provider Notes (Signed)
Emergency Medicine Observation Re-evaluation Note  Jackson [name] is a 36 y.o. male, initially seen in the emergency department for psychiatric concern.  No acute events since last note.  Physical Exam  BP (!) 129/90 (BP Location: Left Arm)   Pulse 68   Temp 98.2 F (36.8 C) (Oral)   Resp 16   Ht 6' (1.829 m)   Wt 113 kg   SpO2 96%   BMI 33.79 kg/m   ED Course / MDM   Patient did have a COVID test performed earlier this month that was negative otherwise no recent lab work performed.  Plan  Current plan is for placement to an appropriate living facility once available.  Jackson [name] is not under involuntary commitment.     Minna Antis, MD 03/28/22 1410

## 2022-03-28 NOTE — ED Notes (Signed)
Vol /pending placement 

## 2022-03-28 NOTE — ED Notes (Signed)
Pt given snack. 

## 2022-03-28 NOTE — ED Notes (Signed)
Report to include Situation, Background, Assessment, and Recommendations received from Georgia RN. Patient alert and oriented, warm and dry, in no acute distress. Patient denies SI, HI, AVH and pain. Patient made aware of Q15 minute rounds and security cameras for their safety. Patient instructed to come to me with needs or concerns.  

## 2022-03-28 NOTE — ED Notes (Signed)
VOL/pending placement 

## 2022-03-29 DIAGNOSIS — F250 Schizoaffective disorder, bipolar type: Secondary | ICD-10-CM | POA: Diagnosis not present

## 2022-03-29 MED ORDER — BENZOCAINE 10 % MT GEL
Freq: Four times a day (QID) | OROMUCOSAL | Status: DC | PRN
  Administered 2022-03-30 – 2022-04-03 (×3): 1 via OROMUCOSAL
  Filled 2022-03-29 (×4): qty 9

## 2022-03-29 NOTE — ED Notes (Signed)
Pt given snack, pt has no other needs at this time.

## 2022-03-29 NOTE — ED Notes (Signed)
Patient ate 100% of lunch and beverage, no signs of distress, Patient is safe, will continue to monitor.

## 2022-03-29 NOTE — ED Notes (Signed)
VOL/pending placement 

## 2022-03-29 NOTE — ED Provider Notes (Signed)
Emergency Medicine Observation Re-evaluation Note  Jackson [name] is a 36 y.o. male, seen on rounds today.  Pt initially presented to the ED for complaints of Psychiatric Evaluation Currently, the patient is resting in the day room.  He continues to report chronic dental pain which is unchanged from prior.  He request to speak to a patient relations representative and I have passed this onto the RN.  Physical Exam  BP 123/68 (BP Location: Left Arm)   Pulse 70   Temp 97.8 F (36.6 C) (Oral)   Resp 16   Ht 6' (1.829 m)   Wt 113 kg   SpO2 97%   BMI 33.79 kg/m  Physical Exam General: No distress Cardiac: Perfused Lungs: Normal respiratory effort Psych: Calm and cooperative  ED Course / MDM   I have reviewed the labs performed to date as well as medications administered while in observation.  There have been no significant changes in the last 24 hours.  Plan  Current plan is for placement.  Jackson [name] is not under involuntary commitment.     Jackson Bucy, MD 03/29/22 1531

## 2022-03-29 NOTE — ED Notes (Signed)
Patient is compliant, she had breakfast, and beverage, did complain of headache, nurse administered tylenol, will continue to monitor, camera surveillance in progress for safety, He denies Si;;hi or avh.

## 2022-03-29 NOTE — ED Notes (Signed)
Provided pt with dinner tray.

## 2022-03-29 NOTE — ED Notes (Signed)
Pt is in day room, calm, cooperative no needs at present. Will continue to monitor

## 2022-03-30 DIAGNOSIS — F250 Schizoaffective disorder, bipolar type: Secondary | ICD-10-CM | POA: Diagnosis not present

## 2022-03-30 NOTE — ED Notes (Signed)
VOL/Pending Placement 

## 2022-03-30 NOTE — ED Notes (Signed)
Pt awake, came to door. AM meds given. Pt calm and cooperative.

## 2022-03-30 NOTE — ED Notes (Signed)
Snack and beverage given. 

## 2022-03-30 NOTE — ED Notes (Signed)
Gave sprite and apple juice.

## 2022-03-30 NOTE — ED Notes (Signed)
Report to include Situation, Background, Assessment, and Recommendations received from  Reina  RN. Patient alert and oriented, warm and dry, in no acute distress. Patient denies SI, HI, AVH and pain. Patient made aware of Q15 minute rounds and security cameras for their safety. Patient instructed to come to me with needs or concerns.  

## 2022-03-30 NOTE — ED Notes (Signed)
Patient is vol pending placement 

## 2022-03-31 DIAGNOSIS — F250 Schizoaffective disorder, bipolar type: Secondary | ICD-10-CM | POA: Diagnosis not present

## 2022-03-31 NOTE — ED Notes (Signed)
Dinner tray and beverage provided 

## 2022-03-31 NOTE — ED Notes (Signed)
Pt given snack. 

## 2022-03-31 NOTE — ED Notes (Signed)
Pt in room with lights off; eyes closed and even respirations. No distress noted at this time.

## 2022-03-31 NOTE — ED Notes (Signed)
Vol /pending placement 

## 2022-03-31 NOTE — ED Notes (Signed)
Pt up to restroom with steady gait. Provided a beverage per pt request. Pt appears calm and cooperative.

## 2022-03-31 NOTE — ED Provider Notes (Signed)
Emergency Medicine Observation Re-evaluation Note  Jackson [name] is a 36 y.o. male, seen on rounds today.  Pt initially presented to the ED for complaints of Psychiatric Evaluation Currently, the patient is resting in the day room and has no acute complaints.  Physical Exam  BP 108/84 (BP Location: Left Arm)   Pulse 84   Temp 97.9 F (36.6 C) (Oral)   Resp 17   Ht 6' (1.829 m)   Wt 113 kg   SpO2 96%   BMI 33.79 kg/m  Physical Exam General: No distress Cardiac: Well perfused Lungs: Normal respiratory effort Psych: Calm and cooperative  ED Course / MDM   I have reviewed the labs performed to date as well as medications administered while in observation.    Plan  Current plan is for placement.  Jackson [name] is not under involuntary commitment.     Dionne Bucy, MD 03/31/22 [phone]

## 2022-03-31 NOTE — ED Notes (Signed)
Snack provided with beverage  

## 2022-03-31 NOTE — ED Provider Notes (Signed)
Emergency Medicine Observation Re-evaluation Note  Jackson [name] is a 36 y.o. male, initially seen in the emergency department for psychiatric concern.  No acute events since last update.  Physical Exam  BP 108/84 (BP Location: Left Arm)   Pulse 84   Temp 97.9 F (36.6 C) (Oral)   Resp 17   Ht 6' (1.829 m)   Wt 113 kg   SpO2 96%   BMI 33.79 kg/m   ED Course / MDM   No recent lab work for review.  Plan  Current plan is for placement to an appropriate living facility once available.  Jackson [name] is not under involuntary commitment.     Minna Antis, MD 03/31/22 1200

## 2022-03-31 NOTE — ED Notes (Signed)
Snack and beverage given. 

## 2022-03-31 NOTE — ED Notes (Signed)
Pt given nighttime snack. 

## 2022-03-31 NOTE — ED Notes (Signed)
Report to include Situation, Background, Assessment, and Recommendations received from Andrea RN. Patient alert and oriented, warm and dry, in no acute distress. Patient denies SI, HI, AVH and pain. Patient made aware of Q15 minute rounds and security cameras for their safety. Patient instructed to come to me with needs or concerns.  

## 2022-04-01 DIAGNOSIS — F250 Schizoaffective disorder, bipolar type: Secondary | ICD-10-CM | POA: Diagnosis not present

## 2022-04-01 NOTE — ED Notes (Signed)
Vol /pending placement 

## 2022-04-01 NOTE — ED Notes (Signed)
Pt given snack tray, drink and icecream

## 2022-04-01 NOTE — ED Notes (Signed)
Patient c/o dental pain and requesting for medication to help him ease the pain.

## 2022-04-01 NOTE — ED Notes (Signed)
VOL  PENDING  PLACEMENT 

## 2022-04-01 NOTE — ED Notes (Signed)
Dinner tray and beverage provided 

## 2022-04-01 NOTE — ED Notes (Signed)
Pt assisted in cleaning his room including wiping mattress, throw away trash, and wipe bed base. Clean linens given to pt to put on his bed.

## 2022-04-01 NOTE — ED Provider Notes (Signed)
Emergency Medicine Observation Re-evaluation Note  Jackson [name] is a 36 y.o. male, seen on rounds today.  Pt initially presented to the ED for complaints of Psychiatric Evaluation   Physical Exam  BP 126/77 (BP Location: Left Arm)   Pulse 77   Temp 98.3 F (36.8 C) (Oral)   Resp 17   Ht 6' (1.829 m)   Wt 113 kg   SpO2 98%   BMI 33.79 kg/m  Physical Exam General: no distress Lungs: no increased wob Psych: calm  ED Course / MDM  EKG:EKG Interpretation  Date/Time:  Thursday January 30 2022 01:59:27 EDT Ventricular Rate:  78 PR Interval:  190 QRS Duration: 78 QT Interval:  336 QTC Calculation: 383 R Axis:   63 Text Interpretation: Normal sinus rhythm Abnormal QRS-T angle, consider primary T wave abnormality Abnormal ECG When compared with ECG of 13-Feb-2021 16:33, No significant change was found Confirmed by UNCONFIRMED, DOCTOR ([id number]), editor Kearney Hard ([id number]) on 01/30/2022 1:21:03 PM  I have reviewed the labs performed to date as well as medications administered while in observation.  Recent changes in the last 24 hours include none.  Plan  Current plan is for SW placement.    Georga Hacking, MD 04/01/22 [phone]

## 2022-04-01 NOTE — TOC Progression Note (Signed)
Transition of Care [facility]) - Progression Note    Patient Details  Name: Jackson [name] MRN: [id number] Date of Birth: 12-24-1985  [facility]) CM/SW Contact  Allayne Butcher, RN Phone Number: 04/01/2022, 10:19 AM  Clinical Narrative:    Reached out to Recardo Evangelist with [facility] [employer] to see if she has heard from [place], Albin Felling is out of the office til Friday.    Expected Discharge Plan: Group Home Barriers to Discharge: ED Unsafe disposition  Expected Discharge Plan and Services Expected Discharge Plan: Group Home   Discharge Planning Services: CM Consult   Living arrangements for the past 2 months: Boarding House                 DME Arranged: N/A DME Agency: NA       HH Arranged: NA HH Agency: NA         Social Determinants of Health (SDOH) Interventions    Readmission Risk Interventions     No data to display

## 2022-04-01 NOTE — ED Notes (Signed)
Pt given snack. 

## 2022-04-02 DIAGNOSIS — F250 Schizoaffective disorder, bipolar type: Secondary | ICD-10-CM | POA: Diagnosis not present

## 2022-04-02 MED ORDER — PENICILLIN V POTASSIUM 500 MG PO TABS
500.0000 mg | ORAL_TABLET | Freq: Four times a day (QID) | ORAL | Status: AC
  Administered 2022-04-02 – 2022-04-12 (×35): 500 mg via ORAL
  Filled 2022-04-02 (×44): qty 1

## 2022-04-02 MED ORDER — IBUPROFEN 800 MG PO TABS
800.0000 mg | ORAL_TABLET | Freq: Once | ORAL | Status: AC
  Administered 2022-04-02: 800 mg via ORAL

## 2022-04-02 NOTE — ED Notes (Signed)
Patient is pleasant, no signs of distress, will continue to monitor, camera surveillance in progress and q 15 minute checks, Patient without behavioral issues and denies Si/hi or avh.

## 2022-04-02 NOTE — ED Notes (Signed)
Pt given snack. 

## 2022-04-02 NOTE — ED Notes (Signed)
Pt. Ate 100% of meal and beverage, Patient is calm and cooperative, no behavioral issues

## 2022-04-02 NOTE — ED Notes (Signed)
Dr Derrill Kay came to see the Patient and spoke to him about His pain coming from tooth, and Dr states He will put in new orders.

## 2022-04-02 NOTE — ED Notes (Signed)
Vol /pending placement 

## 2022-04-02 NOTE — ED Provider Notes (Signed)
Evaluated patient today because of concern for tooth pain. On exam patient does have some poor dentition of back molars. Given concern for possible dental infection causing pain will place patient on antibiotics.    [name], MD 04/02/22 [phone]

## 2022-04-02 NOTE — ED Notes (Signed)
Report received from Wedny, English as a second language teacher. Patient alert and oriented, warm and dry, in no acute distress. Patient denies SI, HI, AVH and pain. Patient made aware of Q15 minute rounds and security cameras for their safety. Patient instructed to come to this nurse with needs or concerns.

## 2022-04-02 NOTE — ED Notes (Addendum)
Patient complaining of pain coming from tooth that is radiating to ear and head,  Pain level is at 8, administered tylenol, and notified MD also.

## 2022-04-02 NOTE — ED Notes (Signed)
Pt complains of excruciating tooth pain after eating a frozen italian ice. Pt provided tooth pain relief that is ordered. Pt upset that he is left here to suffer with this pain that has been ongoing. Secure chat message will be sent to Dr. Darnelle Catalan to alert of situation.

## 2022-04-03 DIAGNOSIS — F250 Schizoaffective disorder, bipolar type: Secondary | ICD-10-CM | POA: Diagnosis not present

## 2022-04-03 NOTE — ED Notes (Signed)
Pt given snack. 

## 2022-04-03 NOTE — ED Notes (Signed)
Pt did receive Lunch and beverages and ate all.  Disposed of all trash in garbage.

## 2022-04-03 NOTE — ED Notes (Signed)
Vol /pending placement 

## 2022-04-03 NOTE — ED Notes (Signed)
Report to include Situation, Background, Assessment, and Recommendations received from Leala RN. Patient alert and oriented, warm and dry, in no acute distress. Patient denies SI, HI, AVH and pain. Patient made aware of Q15 minute rounds and security cameras for their safety. Patient instructed to come to me with needs or concerns.  

## 2022-04-03 NOTE — ED Notes (Signed)
Patient threw away trash, and received dinner. 

## 2022-04-03 NOTE — ED Notes (Signed)
Snack and beverage given. 

## 2022-04-03 NOTE — TOC Progression Note (Signed)
Transition of Care [facility]) - Progression Note    Patient Details  Name: Jackson [name] MRN: [id number] Date of Birth: 17-Aug-1985  Transition of Care [facility]) CM/SW Contact  Allayne Butcher, RN Phone Number: 04/03/2022, 9:47 AM  Clinical Narrative:    [facility] [employer] Albin Felling is out of the office till next week but her coworker French Ana is covering and has been trying to get in touch with [facility] to see if they made a decision on accepting patient.  They are also sending out other referrals for placement.   Expected Discharge Plan: Group Home Barriers to Discharge: ED Unsafe disposition  Expected Discharge Plan and Services Expected Discharge Plan: Group Home   Discharge Planning Services: CM Consult   Living arrangements for the past 2 months: Boarding House                 DME Arranged: N/A DME Agency: NA       HH Arranged: NA HH Agency: NA         Social Determinants of Health (SDOH) Interventions    Readmission Risk Interventions     No data to display

## 2022-04-03 NOTE — ED Notes (Signed)
Pt did receive breakfast and beverages. Pt did eat all and disposed of trash in garbage

## 2022-04-03 NOTE — ED Notes (Signed)
Report received from Cristal Deer, English as a second language teacher.  Patient alert, oriented, war and dry in no acute distress.  Pt denies SI/HI/AV/H.  Pt c/o tooth pain take prn pain med as needed. Pt made aware of Q 15 minute rounds and security cameras for their safety.  Patient instructed to come to this nurse with needs or concerns.

## 2022-04-04 DIAGNOSIS — F250 Schizoaffective disorder, bipolar type: Secondary | ICD-10-CM | POA: Diagnosis not present

## 2022-04-04 MED ORDER — LIDOCAINE VISCOUS HCL 2 % MT SOLN
15.0000 mL | Freq: Once | OROMUCOSAL | Status: AC
  Administered 2022-04-04: 15 mL via OROMUCOSAL
  Filled 2022-04-04: qty 15

## 2022-04-04 NOTE — ED Notes (Signed)
Pt received lunch tray. Pt resting at this time.

## 2022-04-04 NOTE — ED Notes (Signed)
Pt given some Orajel as requested.

## 2022-04-04 NOTE — ED Notes (Signed)
Vol /pending placement 

## 2022-04-04 NOTE — ED Provider Notes (Signed)
Emergency Medicine Observation Re-evaluation Note  Jackson [name] is a 36 y.o. male, seen on rounds today.  Pt initially presented to the ED for complaints of Psychiatric Evaluation Currently, the patient is resting comfortably in his room  Physical Exam  BP 113/70 (BP Location: Right Arm)   Pulse 76   Temp 98.2 F (36.8 C) (Oral)   Resp 20   Ht 6' (1.829 m)   Wt 113 kg   SpO2 93%   BMI 33.79 kg/m  Physical Exam General: No distress Cardiac: Well perfused Lungs: Normal respiratory effort Psych: Calm and cooperative  ED Course / MDM   I have reviewed the labs performed to date as well as medications administered while in observation.  There have been no changes to his status in the last 24 hours.  Plan  Current plan is for placement.    Dionne Bucy, MD 04/04/22 1520

## 2022-04-04 NOTE — ED Notes (Signed)
Patient is in excruciating dental pain. He said the Cory Munch is not working for him. He is currently taking antibiotic. He is requesting to see dentist. EDP notified.

## 2022-04-04 NOTE — ED Notes (Signed)
Will give VEETID once received from pharm.

## 2022-04-04 NOTE — ED Notes (Signed)
Report received from Lanett, English as a second language teacher.  Pt alert oriented, warm and dry. Pt in no acute distress.  Pt denies SI/HI/AV/H.  Pt given prns to manage tooth pain.  Pt made aware of q 15 minute safety rounds and security cameras for their safety.  Pt instructed to come to this Nurse with any needs or concerns.

## 2022-04-04 NOTE — ED Notes (Signed)
Report to include Situation, Background, Assessment, and Recommendations received from Georgia RN. Patient alert and oriented, warm and dry, in no acute distress. Patient denies SI, HI, AVH and pain. Patient made aware of Q15 minute rounds and security cameras for their safety. Patient instructed to come to me with needs or concerns.  

## 2022-04-04 NOTE — ED Notes (Signed)
Snack and beverage given. 

## 2022-04-04 NOTE — ED Notes (Signed)
Pt taking shower as requested. 

## 2022-04-04 NOTE — ED Notes (Signed)
VOL/Pending Placement 

## 2022-04-05 DIAGNOSIS — F250 Schizoaffective disorder, bipolar type: Secondary | ICD-10-CM | POA: Diagnosis not present

## 2022-04-05 NOTE — ED Notes (Signed)
Pt given snack, no other needs at this moment.  

## 2022-04-05 NOTE — ED Notes (Signed)
Dinner tray given to pt

## 2022-04-05 NOTE — ED Notes (Signed)
VOL/pending placement 

## 2022-04-05 NOTE — ED Notes (Signed)
Report to include Situation, Background, Assessment, and Recommendations received from Ariel RN. Patient alert and oriented, warm and dry, in no acute distress. Patient denies SI, HI, AVH and pain. Patient made aware of Q15 minute rounds and security cameras for their safety. Patient instructed to come to me with needs or concerns.  

## 2022-04-05 NOTE — ED Provider Notes (Signed)
Emergency Medicine Observation Re-evaluation Note  Jackson [name] is a 36 y.o. male, seen on rounds today.  Pt initially presented to the ED for complaints of Psychiatric Evaluation  Currently, the patient is asleep in bed- no issues per [facility] nurse  Physical Exam  Blood pressure 104/60, pulse 88, temperature 98.5 F (36.9 C), temperature source Oral, resp. rate 20, height 6' (1.829 m), weight 113 kg, SpO2 96 %.  Physical Exam General: No apparent distress Pulm: Normal WOB Psych: resting     ED Course / MDM     I have reviewed the labs performed to date as well as medications administered while in observation.  Recent changes in the last 24 hours include none  Plan   Current plan is to continue to wait for psych plan/placement if felt warranted  Patient is not under full IVC at this time.   Concha Se, MD 04/05/22 [phone]

## 2022-04-05 NOTE — ED Notes (Signed)
Vol /pending placement 

## 2022-04-06 DIAGNOSIS — F250 Schizoaffective disorder, bipolar type: Secondary | ICD-10-CM | POA: Diagnosis not present

## 2022-04-06 NOTE — ED Notes (Signed)
Pt with c/o left lower tooth pain, oragel and tylenol given.

## 2022-04-06 NOTE — ED Notes (Signed)
Pt alert, given snack and drink, pt states he does not want a shower at this time.

## 2022-04-06 NOTE — ED Notes (Signed)
Pt given opportunity to shower and change sheets, pt politely refused and states he showered and changed his sheets yesterday and did not want to today.

## 2022-04-06 NOTE — ED Notes (Signed)
PT given dinner tray and beverage 

## 2022-04-06 NOTE — ED Provider Notes (Incomplete)
Emergency Medicine Observation Re-evaluation Note  Jackson [name] is a 36 y.o. male, seen on rounds today.  Pt initially presented to the ED for complaints of Psychiatric Evaluation Currently, the patient is resting.   Physical Exam  BP 122/84   Pulse 76   Temp 97.8 F (36.6 C) (Oral)   Resp 16   Ht 6' (1.829 m)   Wt 113 kg   SpO2 96%   BMI 33.79 kg/m  Physical Exam General:  Cardiac: *** Lungs: *** Psych: ***  ED Course / MDM  EKG:EKG Interpretation  Date/Time:  Thursday January 30 2022 01:59:27 EDT Ventricular Rate:  78 PR Interval:  190 QRS Duration: 78 QT Interval:  336 QTC Calculation: 383 R Axis:   63 Text Interpretation: Normal sinus rhythm Abnormal QRS-T angle, consider primary T wave abnormality Abnormal ECG When compared with ECG of 13-Feb-2021 16:33, No significant change was found Confirmed by UNCONFIRMED, DOCTOR ([id number]), editor Kearney Hard ([id number]) on 01/30/2022 1:21:03 PM  I have reviewed the labs performed to date as well as medications administered while in observation.  Recent changes in the last 24 hours include ***.  Plan  Current plan is for ***.

## 2022-04-06 NOTE — ED Notes (Signed)
Pt given lunch tray and drink, pt tolerated well.  

## 2022-04-06 NOTE — ED Provider Notes (Signed)
Emergency Medicine Observation Re-evaluation Note  Jackson [name] is a 36 y.o. male, seen on rounds today.  Pt initially presented to the ED for complaints of Psychiatric Evaluation Currently, the patient is sleeping  Physical Exam  BP 122/84   Pulse 76   Temp 97.8 F (36.6 C) (Oral)   Resp 16   Ht 6' (1.829 m)   Wt 113 kg   SpO2 96%   BMI 33.79 kg/m  Physical Exam General: no distress Lungs: no increased wob Psych: no agitation  ED Course / MDM  EKG:EKG Interpretation  Date/Time:  Thursday January 30 2022 01:59:27 EDT Ventricular Rate:  78 PR Interval:  190 QRS Duration: 78 QT Interval:  336 QTC Calculation: 383 R Axis:   63 Text Interpretation: Normal sinus rhythm Abnormal QRS-T angle, consider primary T wave abnormality Abnormal ECG When compared with ECG of 13-Feb-2021 16:33, No significant change was found Confirmed by UNCONFIRMED, DOCTOR ([id number]), editor Kearney Hard ([id number]) on 01/30/2022 1:21:03 PM  I have reviewed the labs performed to date as well as medications administered while in observation.  Recent changes in the last 24 hours include none.  Plan  Current plan is for SW placement.    Georga Hacking, MD 04/06/22 [phone]

## 2022-04-06 NOTE — ED Notes (Signed)
Hospital meal provided, pt tolerated w/o complaints.  Waste discarded appropriately.  

## 2022-04-06 NOTE — ED Notes (Signed)
VOL/Pending Placement 

## 2022-04-07 DIAGNOSIS — F250 Schizoaffective disorder, bipolar type: Secondary | ICD-10-CM | POA: Diagnosis not present

## 2022-04-07 NOTE — ED Notes (Signed)
Hospital meal provided.  100% consumed, pt tolerated w/o complaints.  Waste discarded appropriately.   

## 2022-04-07 NOTE — ED Notes (Signed)
Pt used restroom ?

## 2022-04-07 NOTE — ED Notes (Signed)
Breakfast given. Declined shower.

## 2022-04-07 NOTE — ED Notes (Signed)
Resumed care from [facility].  Pt in dayroom  pt alert  talking with security.

## 2022-04-07 NOTE — ED Notes (Signed)
VOL  PENDING  PLACEMENT 

## 2022-04-07 NOTE — ED Notes (Signed)
Patient given dinner tray. Patient given plastic spoon. Will collect utensils once patient is done eating.

## 2022-04-07 NOTE — ED Notes (Signed)
Vol /pending placement 

## 2022-04-07 NOTE — ED Notes (Signed)
Meds given. Pt in room

## 2022-04-07 NOTE — ED Notes (Signed)
Utensils collected and disposed.  

## 2022-04-08 DIAGNOSIS — F250 Schizoaffective disorder, bipolar type: Secondary | ICD-10-CM | POA: Diagnosis not present

## 2022-04-08 NOTE — ED Provider Notes (Signed)
Emergency Medicine Observation Re-evaluation Note  Jackson [name] is a 36 y.o. male, seen on rounds today.  Pt initially presented to the ED for complaints of Psychiatric Evaluation  Currently, the patient is calm, no acute complaints.  Physical Exam  Blood pressure 103/80, pulse 76, temperature 98.3 F (36.8 C), temperature source Oral, resp. rate 18, height 6' (1.829 m), weight 113 kg, SpO2 97 %. Physical Exam General: NAD Lungs: CTAB Psych: not agitated  ED Course / MDM  EKG:    I have reviewed the labs performed to date as well as medications administered while in observation.  Recent changes in the last 24 hours include no acute events overnight.    Plan  Current plan is for SW placement. Patient is not under full IVC at this time.   Sharman Cheek, MD 04/08/22 [phone]

## 2022-04-08 NOTE — ED Notes (Signed)
Hospital meal provided, pt tolerated w/o complaints.  Waste discarded appropriately.  

## 2022-04-08 NOTE — ED Notes (Signed)
Pt given snack. 

## 2022-04-08 NOTE — ED Notes (Signed)
Vol/Pending Placement  

## 2022-04-08 NOTE — ED Notes (Signed)
Pt taking shower. Pt was given hygiene items and the following, 1 clean top, 1 clean bottom, with 1 pair of disposable underwear.  Pt changed out into clean clothing.  Staff disposed of all shower supplies.   

## 2022-04-08 NOTE — ED Notes (Signed)
Patient is calm and sitting in the dayroom, no signs of distress, denies/hi or avh. Will continue to monitor.

## 2022-04-08 NOTE — TOC Progression Note (Signed)
Transition of Care [facility]) - Progression Note    Patient Details  Name: Jackson [name] MRN: [id number] Date of Birth: Sep 13, 1985  Transition of Care [facility]) CM/SW Contact  Allayne Butcher, RN Phone Number: 04/08/2022, 9:37 AM  Clinical Narrative:    RNCM reached out to [facility] [employer] Team to see if there are any updates on placement.  Never heard back from [facility].     Expected Discharge Plan: Group Home Barriers to Discharge: ED Unsafe disposition  Expected Discharge Plan and Services Expected Discharge Plan: Group Home   Discharge Planning Services: CM Consult   Living arrangements for the past 2 months: Boarding House                 DME Arranged: N/A DME Agency: NA       HH Arranged: NA HH Agency: NA         Social Determinants of Health (SDOH) Interventions    Readmission Risk Interventions     No data to display
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
## 2022-04-08 NOTE — ED Notes (Signed)
Half a sandwich was provided as snack and with applesauce.

## 2022-04-08 NOTE — ED Notes (Signed)
Patient sitting in the dayroom, He is pleasant, no complaints at this time, will continue to monitor.

## 2022-04-08 NOTE — ED Notes (Signed)
Report received from Wendy, RN including SBAR. Patient alert and oriented, warm and dry, in no acute distress. Patient denies SI, HI, AVH and pain. Patient made aware of Q15 minute rounds and security cameras for their safety. Patient instructed to come to this nurse with needs or concerns.  

## 2022-04-09 DIAGNOSIS — F250 Schizoaffective disorder, bipolar type: Secondary | ICD-10-CM | POA: Diagnosis not present

## 2022-04-09 NOTE — ED Notes (Signed)
Patient unsure at this time about taking shower, patient states I will let you know if I do

## 2022-04-09 NOTE — ED Notes (Signed)
Hospital Meal given to pt.

## 2022-04-09 NOTE — ED Notes (Signed)
Snacks, drink and ice cream provided

## 2022-04-09 NOTE — ED Notes (Signed)
VOL/pending placement 

## 2022-04-09 NOTE — ED Provider Notes (Signed)
Emergency Medicine Observation Re-evaluation Note  Jackson [name] is a 36 y.o. male, initially seen in the emergency department for psychiatric complaint.  No acute events since last update.  Physical Exam  BP 108/80 (BP Location: Right Arm)   Pulse 86   Temp 98.6 F (37 C) (Oral)   Resp 20   Ht 6' (1.829 m)   Wt 113 kg   SpO2 98%   BMI 33.79 kg/m    ED Course / MDM   No recent labs for review.  Plan  Current plan is for placement to an appropriate living facility once available.Minna Antis, MD 04/09/22 [phone]

## 2022-04-09 NOTE — ED Notes (Signed)
Vol /pending placement 

## 2022-04-10 DIAGNOSIS — F250 Schizoaffective disorder, bipolar type: Secondary | ICD-10-CM | POA: Diagnosis not present

## 2022-04-10 NOTE — ED Notes (Signed)
Patient got breakfast tray and a drink. 

## 2022-04-10 NOTE — ED Notes (Signed)
Snack and beverage given. 

## 2022-04-10 NOTE — ED Notes (Signed)
Lunch and drink provided. 

## 2022-04-10 NOTE — ED Notes (Addendum)
Report to include Situation, Background, Assessment, and Recommendations received from Jane RN. Patient alert and oriented, warm and dry, in no acute distress. Patient denies SI, HI, AVH and pain. Patient made aware of Q15 minute rounds and security cameras for their safety. Patient instructed to come to me with needs or concerns.  

## 2022-04-10 NOTE — ED Provider Notes (Signed)
Emergency Medicine Observation Re-evaluation Note  Jackson [name] is a 36 y.o. male, seen on rounds today.  Pt initially presented to the ED for complaints of Psychiatric Evaluation Currently, the patient is resting.  Physical Exam  BP 122/86 (BP Location: Left Arm)   Pulse 81   Temp 98 F (36.7 C) (Oral)   Resp 18   Ht 6' (1.829 m)   Wt 113 kg   SpO2 95%   BMI 33.79 kg/m  Physical Exam General: resting in no distress Lungs: unlabored Psych: no agitation  ED Course / MDM  EKG:EKG Interpretation  Date/Time:  Thursday January 30 2022 01:59:27 EDT Ventricular Rate:  78 PR Interval:  190 QRS Duration: 78 QT Interval:  336 QTC Calculation: 383 R Axis:   63 Text Interpretation: Normal sinus rhythm Abnormal QRS-T angle, consider primary T wave abnormality Abnormal ECG When compared with ECG of 13-Feb-2021 16:33, No significant change was found Confirmed by UNCONFIRMED, DOCTOR ([id number]), editor Kearney Hard ([id number]) on 01/30/2022 1:21:03 PM  I have reviewed the labs performed to date as well as medications administered while in observation.  Recent changes in the last 24 hours include no reported morning events.  Plan  Current plan is for dispo planning per [facility].    Pilar Jarvis, MD 04/10/22 [phone]

## 2022-04-10 NOTE — ED Notes (Signed)
VOL  PENDING  PLACEMENT 

## 2022-04-10 NOTE — ED Notes (Signed)
VOL/Pending Placement 

## 2022-04-11 DIAGNOSIS — F250 Schizoaffective disorder, bipolar type: Secondary | ICD-10-CM | POA: Diagnosis not present

## 2022-04-11 NOTE — ED Notes (Signed)
Vol /pending placement 

## 2022-04-11 NOTE — ED Notes (Signed)
Report to include Situation, Background, Assessment, and Recommendations received from Antacia RN. Patient alert and oriented, warm and dry, in no acute distress. Patient denies SI, HI, AVH and pain. Patient made aware of Q15 minute rounds and security cameras for their safety. Patient instructed to come to me with needs or concerns.  

## 2022-04-11 NOTE — ED Provider Notes (Signed)
Emergency Medicine Observation Re-evaluation Note  Jackson [name] is a 36 y.o. male, seen on rounds today.  Pt initially presented to the ED for complaints of Psychiatric Evaluation   Currently, the patient is resting. States PCN is helping a lot   Physical Exam  BP 129/86 (BP Location: Left Arm)   Pulse 78   Temp 98.8 F (37.1 C) (Oral)   Resp 16   Ht 6' (1.829 m)   Wt 113 kg   SpO2 97%   BMI 33.79 kg/m  Physical Exam General: resting in no distress, reports he feels well, and penicillin is helped Lungs: unlabored Psych: no agitation  ED Course / MDM    I have reviewed the labs performed to date as well as medications administered while in observation.  Recent changes in the last 24 hours include no reported morning events.  Plan  Current plan is for dispo planning per [facility].        Sharyn Creamer, MD 04/11/22 [phone]

## 2022-04-11 NOTE — ED Notes (Signed)
VOL/pending placement 

## 2022-04-11 NOTE — ED Notes (Signed)
Hospital meal provided.  

## 2022-04-11 NOTE — ED Notes (Signed)
Snack and beverage given. 

## 2022-04-12 DIAGNOSIS — F250 Schizoaffective disorder, bipolar type: Secondary | ICD-10-CM | POA: Diagnosis not present

## 2022-04-12 NOTE — ED Notes (Signed)
Hospital meal provided, pt tolerated w/o complaints.  Waste discarded appropriately.  

## 2022-04-12 NOTE — ED Notes (Signed)
Report to include Situation, Background, Assessment, and Recommendations received from Antacia RN. Patient alert and oriented, warm and dry, in no acute distress. Patient denies SI, HI, AVH and pain. Patient made aware of Q15 minute rounds and security cameras for their safety. Patient instructed to come to me with needs or concerns.  

## 2022-04-12 NOTE — ED Notes (Signed)
Patient received breakfast and beverage, no signs of distress.

## 2022-04-12 NOTE — ED Notes (Signed)
Patient did eat lunch, no signs of distress, will continue to monitor.

## 2022-04-12 NOTE — ED Provider Notes (Signed)
Emergency Medicine Observation Re-evaluation Note  Jackson [name] is a 36 y.o. male, seen on rounds today.  Pt initially presented to the ED for complaints of Psychiatric Evaluation   Currently, the patient is resting.  Tinges to feel well.  Reports that the penicillin has helped and his dental ear pain is improved Does get a little bit of soreness in his chest when he chews but otherwise improved   Physical Exam  BP 118/75   Pulse 80   Temp 97.8 F (36.6 C) (Oral)   Resp 16   Ht 6' (1.829 m)   Wt 113 kg   SpO2 100%   BMI 33.79 kg/m  Physical Exam General: resting in no distress, reports he feels well, and penicillin is helped Lungs: unlabored Psych: no agitation  ED Course / MDM    I have reviewed the labs performed to date as well as medications administered while in observation.  Recent changes in the last 24 hours include no reported morning events.  Plan  Current plan is for dispo planning per [facility].      Sharyn Creamer, MD 04/12/22 [phone]

## 2022-04-12 NOTE — ED Notes (Signed)
Dr. Currie Paris in room to assess Patient, Patient remained calm and cooperative. No behavioral issues, no signs of distress.

## 2022-04-12 NOTE — ED Notes (Signed)
Hospital meal provided to pt.  

## 2022-04-12 NOTE — ED Notes (Signed)
Snack and beverage given. 

## 2022-04-13 DIAGNOSIS — F250 Schizoaffective disorder, bipolar type: Secondary | ICD-10-CM | POA: Diagnosis not present

## 2022-04-13 NOTE — ED Notes (Signed)
Report to include Situation, Background, Assessment, and Recommendations received from  Samantha RN. Patient alert and oriented, warm and dry, in no acute distress. Patient denies SI, HI, AVH and pain. Patient made aware of Q15 minute rounds and security cameras for their safety. Patient instructed to come to me with needs or concerns.  

## 2022-04-13 NOTE — ED Notes (Signed)
Pt given his salad.

## 2022-04-13 NOTE — ED Provider Notes (Signed)
Emergency Medicine Observation Re-evaluation Note  Jackson [name] is a 36 y.o. male, seen on rounds today.  Pt initially presented to the ED for complaints of Psychiatric Evaluation Currently, the patient is sitting in his room, denies any complaints.  Physical Exam  BP 106/87 (BP Location: Right Arm)   Pulse 71   Temp 97.7 F (36.5 C) (Oral)   Resp 15   Ht 6' (1.829 m)   Wt 113 kg   SpO2 96%   BMI 33.79 kg/m  Physical Exam Constitutional: Resting comfortably. Eyes: Conjunctivae are normal. Head: Atraumatic. Nose: No congestion/rhinnorhea. Mouth/Throat: Mucous membranes are moist. Neck: Normal ROM Cardiovascular: No cyanosis noted. Respiratory: Normal respiratory effort. Gastrointestinal: Non-distended. Genitourinary: deferred Musculoskeletal: No lower extremity tenderness nor edema. Neurologic:  Normal speech and language. No gross focal neurologic deficits are appreciated. Skin:  Skin is warm, dry and intact. No rash noted.   ED Course / MDM  EKG:EKG Interpretation  Date/Time:  Thursday January 30 2022 01:59:27 EDT Ventricular Rate:  78 PR Interval:  190 QRS Duration: 78 QT Interval:  336 QTC Calculation: 383 R Axis:   63 Text Interpretation: Normal sinus rhythm Abnormal QRS-T angle, consider primary T wave abnormality Abnormal ECG When compared with ECG of 13-Feb-2021 16:33, No significant change was found Confirmed by UNCONFIRMED, DOCTOR ([id number]), editor Kearney Hard ([id number]) on 01/30/2022 1:21:03 PM  I have reviewed the labs performed to date as well as medications administered while in observation.  Recent changes in the last 24 hours include none.  Plan  Current plan is for dispo per social work.    Chesley Noon, MD 04/13/22 1049

## 2022-04-13 NOTE — ED Notes (Signed)
Pt given shower supplies, in shower at this time. °

## 2022-04-13 NOTE — ED Notes (Signed)
Patient is vol pending placement 

## 2022-04-13 NOTE — ED Notes (Signed)
Pt given lunch tray and drink at this time. 

## 2022-04-13 NOTE — ED Notes (Signed)
Snack and beverage given. 

## 2022-04-13 NOTE — ED Notes (Signed)
Pt given dinner tray and drink at this time. Pt requesting something different to eat since he had burgers for lunch and was given burgers again for dinner. Dietary called by Doreatha Martin, RN to bring a salad upon pt request.

## 2022-04-13 NOTE — ED Notes (Signed)
Pt out of shower at this time 

## 2022-04-14 DIAGNOSIS — F250 Schizoaffective disorder, bipolar type: Secondary | ICD-10-CM | POA: Diagnosis not present

## 2022-04-14 NOTE — ED Notes (Signed)
VOL/pending placement 

## 2022-04-14 NOTE — ED Provider Notes (Signed)
Emergency Medicine Observation Re-evaluation Note  Jackson [name] is a 36 y.o. male, seen on rounds today.  Pt initially presented to the ED for complaints of Psychiatric Evaluation Currently, the patient is resting.  Physical Exam  BP 110/76 (BP Location: Left Arm)   Pulse 100   Temp 99.2 F (37.3 C) (Oral)   Resp 15   Ht 6' (1.829 m)   Wt 113 kg   SpO2 94%   BMI 33.79 kg/m  Physical Exam General: resting no distress Lungs: unlabored breathing Psych: no agitation  ED Course / MDM  EKG:EKG Interpretation  Date/Time:  Thursday January 30 2022 01:59:27 EDT Ventricular Rate:  78 PR Interval:  190 QRS Duration: 78 QT Interval:  336 QTC Calculation: 383 R Axis:   63 Text Interpretation: Normal sinus rhythm Abnormal QRS-T angle, consider primary T wave abnormality Abnormal ECG When compared with ECG of 13-Feb-2021 16:33, No significant change was found Confirmed by UNCONFIRMED, DOCTOR ([id number]), editor Kearney Hard ([id number]) on 01/30/2022 1:21:03 PM  I have reviewed the labs performed to date as well as medications administered while in observation.  Recent changes in the last 24 hours include no reported morning events.  Plan  Current plan is for sw dispo.    Pilar Jarvis, MD 04/14/22 [phone]

## 2022-04-14 NOTE — ED Notes (Signed)
Pt given dinner tray and drink at this time. 

## 2022-04-14 NOTE — ED Notes (Signed)
Breakfast tray and beverage provided 

## 2022-04-14 NOTE — ED Notes (Signed)
Pt given snack and drink 

## 2022-04-14 NOTE — ED Notes (Signed)
Vol /pending placement 

## 2022-04-14 NOTE — TOC Progression Note (Signed)
Transition of Care [facility]) - Progression Note    Patient Details  Name: Jackson [name] MRN: [id number] Date of Birth: 1986/08/02  [facility]) CM/SW Contact  Allayne Butcher, RN Phone Number: 04/14/2022, 3:25 PM  Clinical Narrative:     Received a call from Elwyn Lade, patient's stepfather.  They believe they have found placement for patient in [place].  Anticipated that they can come and pick him up on Thursday but not set for sure at this time.  Gala Romney will get back to this RNCM with exact discharge and where to send prescriptions for medications.   This will be a group home in [place], they fill will be a good fit for patient.   Expected Discharge Plan: Group Home Barriers to Discharge: ED Unsafe disposition  Expected Discharge Plan and Services Expected Discharge Plan: Group Home   Discharge Planning Services: CM Consult   Living arrangements for the past 2 months: Boarding House                 DME Arranged: N/A DME Agency: NA       HH Arranged: NA HH Agency: NA         Social Determinants of Health (SDOH) Interventions    Readmission Risk Interventions     No data to display

## 2022-04-14 NOTE — TOC Progression Note (Signed)
Transition of Care [facility]) - Progression Note    Patient Details  Name: Jackson [name] MRN: [id number] Date of Birth: 10-Nov-1985  Transition of Care [facility]) CM/SW Contact  Allayne Butcher, RN Phone Number: 04/14/2022, 1:59 PM  Clinical Narrative:    RNCM reached out to [facility], Alliance Case Manager, for updates on placement, last update was that they are continuing to look for placement and they had not heard back from [facility].     Expected Discharge Plan: Group Home Barriers to Discharge: ED Unsafe disposition  Expected Discharge Plan and Services Expected Discharge Plan: Group Home   Discharge Planning Services: CM Consult   Living arrangements for the past 2 months: Boarding House                 DME Arranged: N/A DME Agency: NA       HH Arranged: NA HH Agency: NA         Social Determinants of Health (SDOH) Interventions    Readmission Risk Interventions     No data to display

## 2022-04-15 DIAGNOSIS — F250 Schizoaffective disorder, bipolar type: Secondary | ICD-10-CM | POA: Diagnosis not present

## 2022-04-15 NOTE — ED Notes (Signed)
Vol/Pending Placement  

## 2022-04-15 NOTE — ED Notes (Signed)
Lunch tray and beverage provided 

## 2022-04-15 NOTE — ED Notes (Addendum)
Dinner tray and beverage provided 

## 2022-04-15 NOTE — ED Provider Notes (Signed)
Emergency Medicine Observation Re-evaluation Note  Jackson [name] is a 36 y.o. male, initially seen in the emergency department for psychiatric complaint.  No acute events since last update  Physical Exam  BP 103/63 (BP Location: Right Arm)   Pulse 77   Temp 98.3 F (36.8 C) (Oral)   Resp 16   Ht 6' (1.829 m)   Wt 113 kg   SpO2 97%   BMI 33.79 kg/m   ED Course / MDM   No recent lab work for review  Plan  Current plan is for placement to an appropriate living facility once available.  Patient is not under IVC.    Minna Antis, MD 04/15/22 [id number]

## 2022-04-15 NOTE — ED Notes (Signed)

## 2022-04-15 NOTE — ED Notes (Signed)
Breakfast tray and beverage provided 

## 2022-04-15 NOTE — ED Notes (Signed)
Vol   PENDING  PLACEMENT

## 2022-04-16 DIAGNOSIS — F250 Schizoaffective disorder, bipolar type: Secondary | ICD-10-CM | POA: Diagnosis not present

## 2022-04-16 MED ORDER — ARIPIPRAZOLE ER 400 MG IM SRER: 400.0000 mg | INTRAMUSCULAR | 2 refills | Status: DC

## 2022-04-16 MED ORDER — ARIPIPRAZOLE ER 400 MG IM SRER: 400.0000 mg | INTRAMUSCULAR | 2 refills | Status: AC

## 2022-04-16 MED ORDER — DIVALPROEX SODIUM 500 MG PO DR TAB: 1000.0000 mg | DELAYED_RELEASE_TABLET | Freq: Two times a day (BID) | ORAL | 2 refills | Status: AC

## 2022-04-16 MED ORDER — TRAZODONE HCL 50 MG PO TABS: 25.0000 mg | ORAL_TABLET | Freq: Every day | ORAL | 2 refills | Status: DC

## 2022-04-16 MED ORDER — OLANZAPINE 15 MG PO TBDP: 15.0000 mg | ORAL_TABLET | Freq: Two times a day (BID) | ORAL | 2 refills | Status: AC

## 2022-04-16 MED ORDER — OLANZAPINE 15 MG PO TBDP: 15.0000 mg | ORAL_TABLET | Freq: Two times a day (BID) | ORAL | 2 refills | Status: DC

## 2022-04-16 MED ORDER — DIVALPROEX SODIUM 500 MG PO DR TAB: 1000.0000 mg | DELAYED_RELEASE_TABLET | Freq: Two times a day (BID) | ORAL | 2 refills | Status: DC

## 2022-04-16 MED ORDER — TRAZODONE HCL 50 MG PO TABS: 25.0000 mg | ORAL_TABLET | Freq: Every day | ORAL | 2 refills | Status: AC

## 2022-04-16 NOTE — ED Notes (Signed)
Pt given snack. 

## 2022-04-16 NOTE — ED Notes (Signed)
Pt alert, resting quietly in room   dinner meal was given to pt.

## 2022-04-16 NOTE — ED Provider Notes (Signed)
Emergency Medicine Observation Re-evaluation Note  Jackson [name] is a 36 y.o. male, seen on rounds today.  Pt initially presented to the ED for complaints of Psychiatric Evaluation  Currently, the patient is calm, no acute complaints.  Physical Exam  Blood pressure 106/63, pulse 85, temperature 98.3 F (36.8 C), temperature source Oral, resp. rate 16, height 6' (1.829 m), weight 113 kg, SpO2 95 %. Physical Exam General: NAD Lungs: CTAB Psych: not agitated  ED Course / MDM  EKG:    I have reviewed the labs performed to date as well as medications administered while in observation.  Recent changes in the last 24 hours include no acute events overnight.    Plan  Current plan is for SW dispo. Patient is not under full IVC at this time.   Sharman Cheek, MD 04/16/22 [phone]

## 2022-04-16 NOTE — TOC Progression Note (Signed)
Transition of Care [facility]) - Progression Note    Patient Details  Name: Jackson [name] MRN: [id number] Date of Birth: 1986/01/01  [facility]) CM/SW Contact  Allayne Butcher, RN Phone Number: 04/16/2022, 3:10 PM  Clinical Narrative:    Plan for patient to discharge tomorrow.  Patient's stepfather asked for prescriptions and medication list.  MD printed out hardcopy scripts, put with the chart.  Unsure of what time parents are coming tomorrow to pick patient up.    Expected Discharge Plan: Group Home Barriers to Discharge: ED Unsafe disposition  Expected Discharge Plan and Services Expected Discharge Plan: Group Home   Discharge Planning Services: CM Consult   Living arrangements for the past 2 months: Boarding House                 DME Arranged: N/A DME Agency: NA       HH Arranged: NA HH Agency: NA         Social Determinants of Health (SDOH) Interventions    Readmission Risk Interventions     No data to display

## 2022-04-16 NOTE — ED Notes (Signed)
Vol /pending placement 

## 2022-04-16 NOTE — ED Notes (Signed)
Per Jackson [name] and her note  patient should go with parents on Thursday, Jeanna put prescription on the clipboard for patient for when discharged.

## 2022-04-16 NOTE — ED Notes (Signed)
Report off to kim rn  

## 2022-04-17 DIAGNOSIS — F250 Schizoaffective disorder, bipolar type: Secondary | ICD-10-CM | POA: Diagnosis not present

## 2022-04-17 NOTE — ED Provider Notes (Signed)
Jackson [name] has been boarding here following behavioral issue. He has been seen and cleared by Psychiatry. Social work has been involved and pt has safe disposition home at this time.   Today, pt is well appearing and in NAD. He is calm and cooperative. Heart RRR. Lungs are clear. Abdomen is soft. VS reviewed and are stable.Labs are stable.   Will d/c per Social Work. Papers/resources provided.   [name], MD 04/17/22 1340

## 2022-04-17 NOTE — ED Notes (Signed)
Patient is calm and cooperative, he is excited about leaving today, states that His MOM and stepdad are picking him up today, no behavioral issues, and He denies Si/hi or avh. Patient is safe, q 15 minute checks and camera surveillance in progress for safety.

## 2022-04-17 NOTE — ED Notes (Signed)
Patient's stepdad voiced understanding of discharge instructions, no signs of distress, all belongings given back to patient and family. Patient transferring to new group home according to patient's mom and Stepdad,.

## 2022-04-17 NOTE — TOC Transition Note (Signed)
Transition of Care [facility]) - CM/SW Discharge Note   Patient Details  Name: Jackson [name] MRN: [id number] Date of Birth: 01-02-1986  Transition of Care [facility]) CM/SW Contact:  Allayne Butcher, RN Phone Number: 04/17/2022, 11:21 AM   Clinical Narrative:    Patient to discharge today, stepfather and mother should be arriving to pick him up around noon today.  Family has found patient a group home in [place].  Prescriptions were electronically sent yesterday.     Final next level of care: Home/Self Care Barriers to Discharge: Barriers Resolved   Patient Goals and CMS Choice Patient states their goals for this hospitalization and ongoing recovery are:: Guardian has found a group home      Discharge Placement                Patient to be transferred to facility by: Parents- Guardian Name of family member notified: Doug Patient and family notified of of transfer: 04/17/22  Discharge Plan and Services   Discharge Planning Services: CM Consult            DME Arranged: N/A DME Agency: NA       HH Arranged: NA HH Agency: NA        Social Determinants of Health (SDOH) Interventions     Readmission Risk Interventions     No data to display

## 2022-04-17 NOTE — ED Notes (Signed)
Hospital meal provided, pt tolerated w/o complaints.  Waste discarded appropriately.  

## 2022-04-17 NOTE — ED Notes (Signed)
Vol / pending discharge to a group home today

## 2022-04-17 NOTE — ED Notes (Addendum)
Pt taking shower. Pt was given hygiene items and the following, 1 clean top, 1 clean bottom, with 1 pair of disposable underwear.  Pt changed out into clean clothing.  Staff disposed of all shower supplies.
